# Patient Record
Sex: Female | Born: 1955 | Race: Black or African American | Hispanic: No | Marital: Married | Smoking: Former smoker
Health system: Southern US, Community
[De-identification: ages and names within clinical notes are randomized; demographics above are authoritative.]

## PROBLEM LIST (undated history)

## (undated) DIAGNOSIS — C801 Malignant (primary) neoplasm, unspecified: Secondary | ICD-10-CM

## (undated) DIAGNOSIS — I10 Essential (primary) hypertension: Secondary | ICD-10-CM

## (undated) DIAGNOSIS — J189 Pneumonia, unspecified organism: Secondary | ICD-10-CM

## (undated) DIAGNOSIS — M1990 Unspecified osteoarthritis, unspecified site: Secondary | ICD-10-CM

## (undated) DIAGNOSIS — N898 Other specified noninflammatory disorders of vagina: Secondary | ICD-10-CM

## (undated) DIAGNOSIS — Z923 Personal history of irradiation: Secondary | ICD-10-CM

## (undated) DIAGNOSIS — J45909 Unspecified asthma, uncomplicated: Secondary | ICD-10-CM

## (undated) DIAGNOSIS — B977 Papillomavirus as the cause of diseases classified elsewhere: Secondary | ICD-10-CM

## (undated) DIAGNOSIS — I209 Angina pectoris, unspecified: Secondary | ICD-10-CM

## (undated) DIAGNOSIS — E119 Type 2 diabetes mellitus without complications: Secondary | ICD-10-CM

## (undated) DIAGNOSIS — R519 Headache, unspecified: Secondary | ICD-10-CM

## (undated) DIAGNOSIS — K219 Gastro-esophageal reflux disease without esophagitis: Secondary | ICD-10-CM

## (undated) DIAGNOSIS — I2510 Atherosclerotic heart disease of native coronary artery without angina pectoris: Secondary | ICD-10-CM

## (undated) DIAGNOSIS — R51 Headache: Secondary | ICD-10-CM

## (undated) DIAGNOSIS — R0602 Shortness of breath: Secondary | ICD-10-CM

## (undated) DIAGNOSIS — N189 Chronic kidney disease, unspecified: Secondary | ICD-10-CM

## (undated) DIAGNOSIS — Z8719 Personal history of other diseases of the digestive system: Secondary | ICD-10-CM

## (undated) DIAGNOSIS — R011 Cardiac murmur, unspecified: Secondary | ICD-10-CM

## (undated) HISTORY — PX: NECK SURGERY: SHX720

## (undated) HISTORY — PX: COLONOSCOPY: SHX174

## (undated) HISTORY — DX: Headache, unspecified: R51.9

## (undated) HISTORY — DX: Other specified noninflammatory disorders of vagina: N89.8

## (undated) HISTORY — PX: EYE SURGERY: SHX253

## (undated) HISTORY — PX: WISDOM TOOTH EXTRACTION: SHX21

## (undated) HISTORY — DX: Papillomavirus as the cause of diseases classified elsewhere: B97.7

## (undated) HISTORY — DX: Unspecified asthma, uncomplicated: J45.909

## (undated) HISTORY — PX: ABDOMINAL HYSTERECTOMY: SHX81

## (undated) HISTORY — DX: Headache: R51

---
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
## 1999-02-05 ENCOUNTER — Encounter: Admission: RE | Admit: 1999-02-05 | Discharge: 1999-02-05 | Payer: Self-pay | Admitting: Nephrology

## 1999-02-05 ENCOUNTER — Encounter: Payer: Self-pay | Admitting: Nephrology

## 1999-04-09 ENCOUNTER — Other Ambulatory Visit: Admission: RE | Admit: 1999-04-09 | Discharge: 1999-04-09 | Payer: Self-pay | Admitting: Nephrology

## 1999-08-31 ENCOUNTER — Other Ambulatory Visit: Admission: RE | Admit: 1999-08-31 | Discharge: 1999-08-31 | Payer: Self-pay | Admitting: Obstetrics and Gynecology

## 1999-09-02 ENCOUNTER — Other Ambulatory Visit: Admission: RE | Admit: 1999-09-02 | Discharge: 1999-09-02 | Payer: Self-pay | Admitting: Obstetrics and Gynecology

## 1999-11-17 ENCOUNTER — Other Ambulatory Visit: Admission: RE | Admit: 1999-11-17 | Discharge: 1999-11-17 | Payer: Self-pay | Admitting: Obstetrics and Gynecology

## 1999-12-11 ENCOUNTER — Encounter (INDEPENDENT_AMBULATORY_CARE_PROVIDER_SITE_OTHER): Payer: Self-pay | Admitting: Specialist

## 1999-12-11 ENCOUNTER — Other Ambulatory Visit: Admission: RE | Admit: 1999-12-11 | Discharge: 1999-12-11 | Payer: Self-pay | Admitting: Obstetrics and Gynecology

## 2000-04-12 ENCOUNTER — Other Ambulatory Visit: Admission: RE | Admit: 2000-04-12 | Discharge: 2000-04-12 | Payer: Self-pay | Admitting: Obstetrics and Gynecology

## 2000-07-05 ENCOUNTER — Other Ambulatory Visit: Admission: RE | Admit: 2000-07-05 | Discharge: 2000-07-05 | Payer: Self-pay | Admitting: Obstetrics and Gynecology

## 2001-04-21 ENCOUNTER — Encounter: Payer: Self-pay | Admitting: Nephrology

## 2001-04-21 ENCOUNTER — Encounter: Admission: RE | Admit: 2001-04-21 | Discharge: 2001-04-21 | Payer: Self-pay | Admitting: Nephrology

## 2001-08-13 ENCOUNTER — Emergency Department (HOSPITAL_COMMUNITY): Admission: EM | Admit: 2001-08-13 | Discharge: 2001-08-14 | Payer: Self-pay | Admitting: Emergency Medicine

## 2002-03-20 ENCOUNTER — Other Ambulatory Visit: Admission: RE | Admit: 2002-03-20 | Discharge: 2002-03-20 | Payer: Self-pay | Admitting: Obstetrics and Gynecology

## 2002-09-14 ENCOUNTER — Encounter: Payer: Self-pay | Admitting: Emergency Medicine

## 2002-09-14 ENCOUNTER — Emergency Department (HOSPITAL_COMMUNITY): Admission: EM | Admit: 2002-09-14 | Discharge: 2002-09-14 | Payer: Self-pay | Admitting: Emergency Medicine

## 2003-04-03 ENCOUNTER — Other Ambulatory Visit: Admission: RE | Admit: 2003-04-03 | Discharge: 2003-04-03 | Payer: Self-pay | Admitting: Obstetrics and Gynecology

## 2003-07-24 ENCOUNTER — Encounter (INDEPENDENT_AMBULATORY_CARE_PROVIDER_SITE_OTHER): Payer: Self-pay | Admitting: Specialist

## 2003-07-24 ENCOUNTER — Ambulatory Visit (HOSPITAL_COMMUNITY): Admission: RE | Admit: 2003-07-24 | Discharge: 2003-07-24 | Payer: Self-pay | Admitting: Gastroenterology

## 2004-04-01 ENCOUNTER — Other Ambulatory Visit: Admission: RE | Admit: 2004-04-01 | Discharge: 2004-04-01 | Payer: Self-pay | Admitting: Obstetrics and Gynecology

## 2004-05-29 ENCOUNTER — Ambulatory Visit (HOSPITAL_BASED_OUTPATIENT_CLINIC_OR_DEPARTMENT_OTHER): Admission: RE | Admit: 2004-05-29 | Discharge: 2004-05-29 | Payer: Self-pay | Admitting: Ophthalmology

## 2004-07-15 ENCOUNTER — Encounter: Admission: RE | Admit: 2004-07-15 | Discharge: 2004-10-13 | Payer: Self-pay | Admitting: Internal Medicine

## 2004-10-22 ENCOUNTER — Encounter: Admission: RE | Admit: 2004-10-22 | Discharge: 2005-01-20 | Payer: Self-pay | Admitting: Internal Medicine

## 2005-03-17 ENCOUNTER — Other Ambulatory Visit: Admission: RE | Admit: 2005-03-17 | Discharge: 2005-03-17 | Payer: Self-pay | Admitting: Obstetrics and Gynecology

## 2007-08-08 ENCOUNTER — Ambulatory Visit (HOSPITAL_COMMUNITY): Admission: RE | Admit: 2007-08-08 | Discharge: 2007-08-08 | Payer: Self-pay | Admitting: Obstetrics and Gynecology

## 2008-10-31 ENCOUNTER — Ambulatory Visit (HOSPITAL_COMMUNITY): Admission: RE | Admit: 2008-10-31 | Discharge: 2008-10-31 | Payer: Self-pay | Admitting: Internal Medicine

## 2009-11-03 ENCOUNTER — Ambulatory Visit (HOSPITAL_COMMUNITY): Admission: RE | Admit: 2009-11-03 | Discharge: 2009-11-03 | Payer: Self-pay | Admitting: Internal Medicine

## 2010-06-26 NOTE — Op Note (Signed)
NAMEJANEKA, LIBMAN                       ACCOUNT NO.:  [id number]   MEDICAL RECORD NO.:  [id number]                   PATIENT TYPE:  AMB   LOCATION:  ENDO                                 FACILITY:  [facility]   PHYSICIAN:  Anselmo Rod, M.D.               DATE OF BIRTH:  1956-02-05   DATE OF PROCEDURE:  07/24/2003  DATE OF DISCHARGE:                                 OPERATIVE REPORT   PROCEDURE PERFORMED:  Screening colonoscopy.   ENDOSCOPIST:  Charna Elizabeth, M.D.   INSTRUMENT USED:  Olympus video colonoscope.   INDICATIONS FOR PROCEDURE:  The patient is a 55 year old African-American  female with a history of rectal bleeding and change in bowel habits  undergoing screening colonoscopy.  Rule out colonic polyps, masses, etc.   PREPROCEDURE PREPARATION:  Informed consent was procured from the patient.  The patient was fasted for eight hours prior to the procedure and prepped  with a bottle of magnesium citrate and a gallon of GoLYTELY the night prior  to the procedure.   PREPROCEDURE PHYSICAL:  The patient had stable vital signs.  Neck supple.  Chest clear to auscultation.  S1 and S2 regular.  Abdomen soft with normal  bowel sounds.   DESCRIPTION OF PROCEDURE:  The patient was placed in left lateral decubitus  position and sedated with 75 mg of Demerol and 7.5 mg of Versed  intravenously.  Once the patient was adequately sedated and maintained on  low flow oxygen and continuous cardiac monitoring, the Olympus video  colonoscope was advanced from the rectum to the cecum.  Prominent internal  hemorrhoids were seen on retroflexion in the rectum.  Small sessile polyp  was biopsied from the proximal right colon.  The terminal ileum appeared  normal and without lesions.  No other masses or polyps were identified.  The  appendicular orifice and ileocecal valve were clearly visualized and  photographed.  The patient tolerated the procedure well without  complications.   IMPRESSION:  1. Small sessile polyp biopsied  from proximal right colon.  2. Prominent internal hemorrhoids.  3. Normal terminal ileum.   RECOMMENDATIONS:  1. Await pathology results.  2. Continue high fiber diet with liberal fluid intake.  3. Outpatient followup in the next two weeks for further recommendations.                                               Anselmo Rod, M.D.    JNM/MEDQ  D:  07/24/2003  T:  07/24/2003  Job:  [id number]   cc:   Jarome Matin, M.D.  [address] [place]  [place] [address]  Fax: [phone]

## 2010-06-26 NOTE — Op Note (Signed)
Carolyn [name], Carolyn [name]             ACCOUNT NO.:  [id number]   MEDICAL RECORD NO.:  [id number]          PATIENT TYPE:  AMB   LOCATION:  DSC                          FACILITY:  [facility]   PHYSICIAN:  Salley Scarlet., M.D.DATE OF BIRTH:  1955-05-16   DATE OF PROCEDURE:  05/29/2004  DATE OF DISCHARGE:  05/29/2004                                 OPERATIVE REPORT   PREOPERATIVE DIAGNOSIS:  Chalazion in upper lid right eye.   POSTOPERATIVE DIAGNOSIS:  Chalazion in upper lid right eye.   OPERATION:  Chalazion excision.   ANESTHESIA:  Local using Xylocaine 1%.   PROCEDURE:  The upper lid of the right eye was inspected and there was a  firm mass located along the central aspect of the upper lid of the right  eye.  The lid was then prepped and draped in the usual manner and then  subsequently infiltrated with several mL of lidocaine.  The chalazion clamp  was applied over the lesion and the lid was everted.  A cruciate incision  was made in the tarsus over the lesion and the lesion was curetted using the  chalazion curet.  The site was incised in toto using sharp and blunt  dissection.  Polysporin ophthalmic ointment and a pressure patch was  applied.  The patient tolerated the procedure well and was discharged to the  post anesthesia recovery room in satisfactory condition.  She was instructed  to take Vicodin every 4 hours as needed for pain today, to remove the patch  tomorrow and to resume her drops 4 times a day.  She is to see me in the  office in 1 week.   DISCHARGE DIAGNOSIS:  Chalazion upper lid right eye.      TB/MEDQ  D:  05/29/2004  T:  05/31/2004  Job:  [id number]

## 2010-07-08 ENCOUNTER — Other Ambulatory Visit: Payer: Self-pay | Admitting: Internal Medicine

## 2010-07-08 DIAGNOSIS — N644 Mastodynia: Secondary | ICD-10-CM

## 2010-07-13 ENCOUNTER — Ambulatory Visit
Admission: RE | Admit: 2010-07-13 | Discharge: 2010-07-13 | Disposition: A | Discharge status: Home / Self Care | Payer: Self-pay | Source: Ambulatory Visit | Attending: Internal Medicine | Admitting: Internal Medicine

## 2010-07-13 DIAGNOSIS — N644 Mastodynia: Secondary | ICD-10-CM

## 2010-09-23 ENCOUNTER — Other Ambulatory Visit (HOSPITAL_COMMUNITY): Payer: Self-pay | Admitting: Internal Medicine

## 2010-10-23 ENCOUNTER — Other Ambulatory Visit (HOSPITAL_COMMUNITY): Payer: Self-pay | Admitting: Internal Medicine

## 2010-10-23 DIAGNOSIS — Z1231 Encounter for screening mammogram for malignant neoplasm of breast: Secondary | ICD-10-CM

## 2010-11-02 ENCOUNTER — Ambulatory Visit (HOSPITAL_COMMUNITY)
Admission: RE | Admit: 2010-11-02 | Discharge: 2010-11-02 | Disposition: A | Discharge status: Home / Self Care | Payer: Self-pay | Source: Ambulatory Visit | Attending: Internal Medicine | Admitting: Internal Medicine

## 2010-11-02 DIAGNOSIS — Z1231 Encounter for screening mammogram for malignant neoplasm of breast: Secondary | ICD-10-CM

## 2012-03-23 ENCOUNTER — Ambulatory Visit: Payer: Self-pay | Admitting: Obstetrics and Gynecology

## 2012-03-27 ENCOUNTER — Encounter: Payer: Self-pay | Admitting: Obstetrics and Gynecology

## 2012-03-27 ENCOUNTER — Ambulatory Visit: Payer: BC Managed Care – PPO | Admitting: Obstetrics and Gynecology

## 2012-03-27 VITALS — BP 138/82 | HR 84 | Ht 61.5 in | Wt 202.0 lb

## 2012-03-27 DIAGNOSIS — Z124 Encounter for screening for malignant neoplasm of cervix: Secondary | ICD-10-CM

## 2012-03-27 DIAGNOSIS — Z01419 Encounter for gynecological examination (general) (routine) without abnormal findings: Secondary | ICD-10-CM

## 2012-03-27 NOTE — Progress Notes (Signed)
Regular Periods: no Mammogram: yes  Monthly Breast Ex.: yes Exercise: no  Tetanus < 10 years: no Seatbelts: yes  NI. Bladder Functn.: yes Abuse at home: no  Daily BM's: yes sometimes constipated Stressful Work: no  Healthy Diet: yes Sigmoid-Colonoscopy: 9 years ago  Calcium: no Medical problems this year: hip bothers patient sometimes   LAST PAP:2-3 years ago  Contraception: none pt has hyst  Mammogram:  2013  PCP: Dr. Ricki Miller  PMH: no change  FMH: no change  Last Bone Scan: never  Pt is married.

## 2012-03-27 NOTE — Progress Notes (Signed)
Subjective:    Carolyn [name] is a 57 y.o. female G3P0 who presents for annual exam. The patient complains of left hip pain that were initially relieved with muscle relaxaers, is worse with going up steps and is the side that she sleeps on.  Walks daily    Review of Systems Gastrointestinal:No change in bowel habits, no abdominal pain, no rectal bleeding Genitourinary:negative for abnormal vaginal bleeding,  dysuria, frequency, hematuria, nocturia and urinary incontinence    Objective:     BP 138/82  Pulse 84  Ht 5' 1.5" (1.562 m)  Wt 202 lb (91.627 kg)  BMI 37.55 kg/m2 Weight:  Wt Readings from Last 1 Encounters:  03/27/12 202 lb (91.627 kg)   Body mass index is 37.55 kg/(m^2). General Appearance:  Well nourished in no acute distress HEENT: Grossly normal Neck / Thyroid: Supple, no masses, nodes or enlargement Lungs: Clear to auscultation bilaterally Back: No CVA tenderness Breast Exam: No masses or nodes.No dimpling, nipple retraction or discharge. Cardiovascular: Regular rate and rhythm.  Gastrointestinal: Soft, non-tender, no masses or organomegaly Pelvic Exam: EGBUS-normal, vagina-pink mucosa without lesions, cervix-no tenderness or lesions, uterus-appears normal size, shape and consistency, adnexae-no masses or tenderness Rectovaginal: Normal sphincter tone and  no masses  Lymphatic Exam: Non-palpable nodes in neck, clavicular, axillary, or inguinal regions Skin: No rash or abnormalities Extremities: no clubbing cyanosis or edema Neurologic: Grossly normal Psychiatric: Alert and oriented x 3     Assessment:   Routine GYN Exam Left Hip Pain Left Shoulder Pain   Plan:  DEXA scan  PAP sent  F/U with PCP for management of and possible referral for left shoulder/hip pain  Change waking shoes and stretch after 1 minute of gentle walking before workout  Bowel hygiene, may use probiotics and stool softeners with bowel retraining  RTO 1 year or prn  Reviewed  revised guidelines for PAP smear maintenance schedule.  Skeeter Sheard,ELMIRAPA-C

## 2012-03-28 LAB — PAP IG W/ RFLX HPV ASCU

## 2012-03-30 ENCOUNTER — Other Ambulatory Visit: Payer: BC Managed Care – PPO

## 2012-04-04 ENCOUNTER — Other Ambulatory Visit: Payer: BC Managed Care – PPO

## 2012-06-13 ENCOUNTER — Other Ambulatory Visit: Payer: Self-pay

## 2012-06-13 ENCOUNTER — Ambulatory Visit (HOSPITAL_COMMUNITY)
Admission: RE | Admit: 2012-06-13 | Discharge: 2012-06-14 | Disposition: A | Discharge status: Home / Self Care | Payer: BC Managed Care – PPO | Source: Ambulatory Visit | Attending: Cardiology | Admitting: Cardiology

## 2012-06-13 ENCOUNTER — Encounter (HOSPITAL_COMMUNITY): Payer: Self-pay | Admitting: General Practice

## 2012-06-13 ENCOUNTER — Encounter (HOSPITAL_COMMUNITY)
Admission: RE | Disposition: A | Discharge status: Home / Self Care | Payer: Self-pay | Source: Ambulatory Visit | Attending: Cardiology

## 2012-06-13 DIAGNOSIS — E785 Hyperlipidemia, unspecified: Secondary | ICD-10-CM

## 2012-06-13 DIAGNOSIS — R0609 Other forms of dyspnea: Secondary | ICD-10-CM | POA: Insufficient documentation

## 2012-06-13 DIAGNOSIS — R079 Chest pain, unspecified: Secondary | ICD-10-CM | POA: Insufficient documentation

## 2012-06-13 DIAGNOSIS — R0989 Other specified symptoms and signs involving the circulatory and respiratory systems: Secondary | ICD-10-CM | POA: Insufficient documentation

## 2012-06-13 DIAGNOSIS — R7303 Prediabetes: Secondary | ICD-10-CM

## 2012-06-13 DIAGNOSIS — I2510 Atherosclerotic heart disease of native coronary artery without angina pectoris: Secondary | ICD-10-CM

## 2012-06-13 DIAGNOSIS — I10 Essential (primary) hypertension: Secondary | ICD-10-CM

## 2012-06-13 DIAGNOSIS — Z955 Presence of coronary angioplasty implant and graft: Secondary | ICD-10-CM

## 2012-06-13 DIAGNOSIS — Z79899 Other long term (current) drug therapy: Secondary | ICD-10-CM | POA: Insufficient documentation

## 2012-06-13 DIAGNOSIS — Z9861 Coronary angioplasty status: Secondary | ICD-10-CM

## 2012-06-13 DIAGNOSIS — R7309 Other abnormal glucose: Secondary | ICD-10-CM | POA: Insufficient documentation

## 2012-06-13 DIAGNOSIS — E669 Obesity, unspecified: Secondary | ICD-10-CM | POA: Insufficient documentation

## 2012-06-13 DIAGNOSIS — I209 Angina pectoris, unspecified: Secondary | ICD-10-CM

## 2012-06-13 HISTORY — DX: Unspecified osteoarthritis, unspecified site: M19.90

## 2012-06-13 HISTORY — DX: Atherosclerotic heart disease of native coronary artery without angina pectoris: I25.10

## 2012-06-13 HISTORY — PX: CORONARY ANGIOPLASTY: SHX604

## 2012-06-13 HISTORY — DX: Personal history of other diseases of the digestive system: Z87.19

## 2012-06-13 HISTORY — DX: Angina pectoris, unspecified: I20.9

## 2012-06-13 HISTORY — DX: Cardiac murmur, unspecified: R01.1

## 2012-06-13 HISTORY — DX: Pneumonia, unspecified organism: J18.9

## 2012-06-13 HISTORY — DX: Gastro-esophageal reflux disease without esophagitis: K21.9

## 2012-06-13 HISTORY — DX: Shortness of breath: R06.02

## 2012-06-13 HISTORY — PX: CARDIAC CATHETERIZATION: SHX172

## 2012-06-13 HISTORY — PX: LEFT HEART CATHETERIZATION WITH CORONARY ANGIOGRAM: SHX5451

## 2012-06-13 HISTORY — DX: Essential (primary) hypertension: I10

## 2012-06-13 LAB — POCT ACTIVATED CLOTTING TIME: Activated Clotting Time: 600 seconds

## 2012-06-13 SURGERY — LEFT HEART CATHETERIZATION WITH CORONARY ANGIOGRAM
Anesthesia: LOCAL

## 2012-06-13 MED ORDER — PRASUGREL HCL 10 MG PO TABS
ORAL_TABLET | ORAL | Status: AC
  Filled 2012-06-13: qty 6

## 2012-06-13 MED ORDER — ASPIRIN 81 MG PO CHEW
324.0000 mg | CHEWABLE_TABLET | ORAL | Status: AC
  Administered 2012-06-13: 324 mg via ORAL
  Filled 2012-06-13: qty 4

## 2012-06-13 MED ORDER — HEPARIN SODIUM (PORCINE) 1000 UNIT/ML IJ SOLN
INTRAMUSCULAR | Status: AC
  Filled 2012-06-13: qty 1

## 2012-06-13 MED ORDER — DIAZEPAM 5 MG PO TABS
5.0000 mg | ORAL_TABLET | ORAL | Status: AC
  Administered 2012-06-13: 5 mg via ORAL
  Filled 2012-06-13: qty 1

## 2012-06-13 MED ORDER — HYDROMORPHONE HCL PF 2 MG/ML IJ SOLN
INTRAMUSCULAR | Status: AC
  Filled 2012-06-13: qty 1

## 2012-06-13 MED ORDER — PSYLLIUM 95 % PO PACK
1.0000 | PACK | Freq: Every day | ORAL | Status: DC
  Administered 2012-06-13: 21:00:00 1 via ORAL
  Filled 2012-06-13 (×2): qty 1

## 2012-06-13 MED ORDER — SODIUM CHLORIDE 0.9 % IV SOLN: 250.0000 mL | INTRAVENOUS | Status: DC | PRN

## 2012-06-13 MED ORDER — ATORVASTATIN CALCIUM 80 MG PO TABS
80.0000 mg | ORAL_TABLET | Freq: Every day | ORAL | Status: DC
  Administered 2012-06-13: 80 mg via ORAL
  Filled 2012-06-13 (×2): qty 1

## 2012-06-13 MED ORDER — LISINOPRIL 10 MG PO TABS
10.0000 mg | ORAL_TABLET | Freq: Every evening | ORAL | Status: DC
  Administered 2012-06-13: 16:00:00 10 mg via ORAL
  Filled 2012-06-13 (×3): qty 1

## 2012-06-13 MED ORDER — METOPROLOL TARTRATE 25 MG PO TABS
25.0000 mg | ORAL_TABLET | Freq: Two times a day (BID) | ORAL | Status: DC
  Administered 2012-06-13 (×2): 25 mg via ORAL
  Filled 2012-06-13 (×4): qty 1

## 2012-06-13 MED ORDER — SODIUM CHLORIDE 0.9 % IV SOLN: INTRAVENOUS | Status: DC

## 2012-06-13 MED ORDER — VITAMIN D3 25 MCG (1000 UNIT) PO TABS
1000.0000 [IU] | ORAL_TABLET | Freq: Every day | ORAL | Status: DC
  Filled 2012-06-13: qty 1

## 2012-06-13 MED ORDER — ASPIRIN 81 MG PO CHEW
81.0000 mg | CHEWABLE_TABLET | Freq: Every day | ORAL | Status: DC
  Filled 2012-06-13: qty 1

## 2012-06-13 MED ORDER — SODIUM CHLORIDE 0.9 % IJ SOLN: 3.0000 mL | Freq: Two times a day (BID) | INTRAMUSCULAR | Status: DC

## 2012-06-13 MED ORDER — HEPARIN (PORCINE) IN NACL 2-0.9 UNIT/ML-% IJ SOLN
INTRAMUSCULAR | Status: AC
  Filled 2012-06-13: qty 1000

## 2012-06-13 MED ORDER — TRIAMTERENE-HCTZ 37.5-25 MG PO TABS
1.0000 | ORAL_TABLET | Freq: Every day | ORAL | Status: DC
  Filled 2012-06-13: qty 1

## 2012-06-13 MED ORDER — LORATADINE 10 MG PO TABS
10.0000 mg | ORAL_TABLET | Freq: Every day | ORAL | Status: DC
  Filled 2012-06-13: qty 1

## 2012-06-13 MED ORDER — SODIUM CHLORIDE 0.9 % IV SOLN
1.0000 mL/kg/h | INTRAVENOUS | Status: AC
  Administered 2012-06-13: 1 mL/kg/h via INTRAVENOUS

## 2012-06-13 MED ORDER — VERAPAMIL HCL 2.5 MG/ML IV SOLN
INTRAVENOUS | Status: AC
  Filled 2012-06-13: qty 2

## 2012-06-13 MED ORDER — LIDOCAINE HCL (PF) 1 % IJ SOLN
INTRAMUSCULAR | Status: AC
  Filled 2012-06-13: qty 30

## 2012-06-13 MED ORDER — ONDANSETRON HCL 4 MG/2ML IJ SOLN: 4.0000 mg | Freq: Four times a day (QID) | INTRAMUSCULAR | Status: DC | PRN

## 2012-06-13 MED ORDER — PANTOPRAZOLE SODIUM 20 MG PO TBEC
20.0000 mg | DELAYED_RELEASE_TABLET | Freq: Every day | ORAL | Status: DC
  Administered 2012-06-13: 16:00:00 20 mg via ORAL
  Filled 2012-06-13 (×2): qty 1

## 2012-06-13 MED ORDER — PNEUMOCOCCAL VAC POLYVALENT 25 MCG/0.5ML IJ INJ
0.5000 mL | INJECTION | Freq: Once | INTRAMUSCULAR | Status: DC
  Filled 2012-06-13: qty 0.5

## 2012-06-13 MED ORDER — SODIUM CHLORIDE 0.9 % IJ SOLN: 3.0000 mL | INTRAMUSCULAR | Status: DC | PRN

## 2012-06-13 MED ORDER — ACETAMINOPHEN 325 MG PO TABS: 650.0000 mg | ORAL_TABLET | ORAL | Status: DC | PRN

## 2012-06-13 MED ORDER — DIAZEPAM 2 MG PO TABS: 2.0000 mg | ORAL_TABLET | ORAL | Status: DC | PRN

## 2012-06-13 MED ORDER — CYCLOBENZAPRINE HCL 10 MG PO TABS: 10.0000 mg | ORAL_TABLET | Freq: Three times a day (TID) | ORAL | Status: DC | PRN

## 2012-06-13 MED ORDER — BIVALIRUDIN 250 MG IV SOLR
INTRAVENOUS | Status: AC
  Filled 2012-06-13: qty 250

## 2012-06-13 MED ORDER — NITROGLYCERIN 1 MG/10 ML FOR IR/CATH LAB
INTRA_ARTERIAL | Status: AC
  Filled 2012-06-13: qty 10

## 2012-06-13 MED ORDER — PRASUGREL HCL 10 MG PO TABS
10.0000 mg | ORAL_TABLET | Freq: Every day | ORAL | Status: DC
  Administered 2012-06-14: 10 mg via ORAL
  Filled 2012-06-13: qty 1

## 2012-06-13 MED ORDER — MIDAZOLAM HCL 2 MG/2ML IJ SOLN
INTRAMUSCULAR | Status: AC
  Filled 2012-06-13: qty 2

## 2012-06-13 NOTE — Interval H&P Note (Signed)
History and Physical Interval Note:  06/13/2012 11:31 AM  Carolyn [name]  has presented today for surgery, with the diagnosis of c/p  The various methods of treatment have been discussed with the patient and family. After consideration of risks, benefits and other options for treatment, the patient has consented to  Procedure(s): LEFT HEART CATHETERIZATION WITH CORONARY ANGIOGRAM (N/A) and angioplasty as a surgical intervention .  The patient's history has been reviewed, patient examined, no change in status, stable for surgery.  I have reviewed the patient's chart and labs.  Questions were answered to the patient's satisfaction.     Pamella Pert

## 2012-06-13 NOTE — Progress Notes (Signed)
Utilization Review Completed Maclaine Ahola J. Previn Jian, RN, BSN, NCM [phone]  

## 2012-06-13 NOTE — CV Procedure (Signed)
Procedure performed:  Left heart catheterization including hemodynamic monitoring of the left ventricle, LV gram. Selective right and left coronary arteriography. PTCA and stenting of the mid LAD with a 4.0 x 24 mm non-drug-eluting.  Indication: Patient is a 57 year-old female with history of hypertension,  hyperlipidemia, Pre- Diabetes Mellitus   who presents with chest pain and dyspnea. Patient has  had non invasive testing which was normal with EKG changes at 2 minutes into exercise stress.  Hence is brought to the cardiac catheterization lab to evaluate  coronary anatomy for definitive diagnosis of CAD.  Hemodynamic data: Left ventricular pressure was 116/6 with LVEDP of 11 mm mercury. Aortic pressure was 117/68 with a mean of 89 mm mercury. There was no pressure gradient across the aortic valve.   Left ventricle: Performed in the RAO projection revealed LVEF of 60%. There was No MR. No wall motion abnormality.  Right coronary artery: The vessel is large, mild luminal irregularities. C-Dominant with Circumflex coronary artery.  Left main coronary artery is large and normal.  Circumflex coronary artery: A large vessel giving origin to a large obtuse marginal 1.   LAD:  LAD gives origin to several small diagonals. The diagonals the is a small to moderate size vessel. The LAD after the origin of first septal perforator and between small to moderate size the 3 has a 99% stenosis. Mid to distal LAD has mild luminal irregularities.   Impression: High-grade stenosis of the mid LAD, 99% stenosis.  Interventional data: Successful PTCA and stenting of the 4.0 x 24 mm Veriflex stent to the mid LAD with reduction of stenosis from 99% to  0% with brisk TIMI-3 to TIMI-3 flow maintained in the procedure. Will need Dual antiplatelet therapy with Effient and ASA 81 mg for at least 3 months, probably 9-12 months. Then aspirin indefinitely. A total of 115 cc of contrast was utilized for diagnostic and  interventional procedure.  Technique of diagnostic cardiac catheterization:  Under sterile precautions using a 5 French right radial  arterial access, a 5 [place] Slim-sheath was introduced into the right radial artery. A 5 [place] Tig 4 catheter was advanced into the ascending aorta selective  right coronary artery and left coronary artery was cannulated and angiography was performed in multiple views. The catheter was pulled back Out of the body over exchange length J-wire.  Same Catheter was used to perform LV gram which was performed in RAO projection. Catheter exchanged out of the body over J-Wire. NO immediate complications noted.   Technique of intervention:  Using a 6 [place] Ikari Left 3.5 guide catheter theLM  coronary  was selected and cannulated. Using Angiomax for anticoagulation, I utilized a Runthrough guidewire and across the LAD coronary artery without any difficulty. I placed the tip of the wire into the distal  coronary artery. Angiography was performed.  Then I utilized a 3.0 x 20 mm splinter Legend balloon , I performed balloon angioplasty at 12 and 16 pressure x 2 for 15 and 45 seconds seconds each. I proceeded with implantation of a 4.0 x 24 mm Veriflex  Non  drug-eluting stent into the mid portion of the LAD coronary artery. The stent was deployed at 12 atmospheric pressure for 50  seconds. The stent was then post dilated with a 4.0 x 21 mm [place] sprinter balloon at 18 atmospheric pressure x2 for 35 seconds each throughout the stented segment. Post-balloon angioplasty results were excellent with 0% residual stenoses and TIMI-3 flow was maintained. There was no  evidence of edge dissection. The guidewire was withdrawn out of the body and the guide catheter was engaged and pulled out of the body over the J-wire the was no immediate complication.  Hemostasis achieved with TR band. Patient tolerated the procedure well.   Disposition: Patient will be discharged in the morning unless  complications with out-patient follow up.

## 2012-06-13 NOTE — H&P (Signed)
  Please see office visit notes for complete details of HPI.  

## 2012-06-13 NOTE — Progress Notes (Signed)
TR BAND REMOVAL  LOCATION:    right radial  DEFLATED PER PROTOCOL:    yes  TIME BAND OFF / DRESSING APPLIED:    1600   SITE UPON ARRIVAL:    Level 0  SITE AFTER BAND REMOVAL:    Level 0  REVERSE ALLEN'S TEST:     positive  CIRCULATION SENSATION AND MOVEMENT:    Within Normal Limits   yes  COMMENTS:   Tolerated procedure well 

## 2012-06-14 DIAGNOSIS — I2510 Atherosclerotic heart disease of native coronary artery without angina pectoris: Secondary | ICD-10-CM

## 2012-06-14 DIAGNOSIS — I209 Angina pectoris, unspecified: Secondary | ICD-10-CM

## 2012-06-14 DIAGNOSIS — R7303 Prediabetes: Secondary | ICD-10-CM

## 2012-06-14 DIAGNOSIS — I10 Essential (primary) hypertension: Secondary | ICD-10-CM

## 2012-06-14 DIAGNOSIS — E785 Hyperlipidemia, unspecified: Secondary | ICD-10-CM

## 2012-06-14 DIAGNOSIS — Z9861 Coronary angioplasty status: Secondary | ICD-10-CM

## 2012-06-14 LAB — BASIC METABOLIC PANEL
BUN: 14 mg/dL (ref 6–23)
CO2: 28 mEq/L (ref 19–32)
Calcium: 8.8 mg/dL (ref 8.4–10.5)
Chloride: 101 mEq/L (ref 96–112)
Creatinine, Ser: 1.07 mg/dL (ref 0.50–1.10)
GFR calc Af Amer: 65 mL/min — ABNORMAL LOW (ref >90)
GFR calc non Af Amer: 56 mL/min — ABNORMAL LOW (ref >90)
Glucose, Bld: 122 mg/dL — ABNORMAL HIGH (ref 70–99)
Potassium: 3.7 mEq/L (ref 3.5–5.1)
Sodium: 137 mEq/L (ref 135–145)

## 2012-06-14 LAB — CBC
HCT: 35.7 % — ABNORMAL LOW (ref 36.0–46.0)
Hemoglobin: 11.9 g/dL — ABNORMAL LOW (ref 12.0–15.0)
MCH: 24 pg — ABNORMAL LOW (ref 26.0–34.0)
MCHC: 33.3 g/dL (ref 30.0–36.0)
MCV: 72 fL — ABNORMAL LOW (ref 78.0–100.0)
Platelets: 324 10*3/uL (ref 150–400)
RBC: 4.96 MIL/uL (ref 3.87–5.11)
RDW: 16.1 % — ABNORMAL HIGH (ref 11.5–15.5)
WBC: 9 10*3/uL (ref 4.0–10.5)

## 2012-06-14 MED ORDER — PRASUGREL HCL 10 MG PO TABS: 10.0000 mg | ORAL_TABLET | Freq: Every day | ORAL | Status: DC

## 2012-06-14 MED ORDER — NITROGLYCERIN 0.4 MG SL SUBL: 0.4000 mg | SUBLINGUAL_TABLET | SUBLINGUAL | Status: DC | PRN

## 2012-06-14 NOTE — Discharge Summary (Signed)
Physician Discharge Summary  Patient ID: Carolyn [name] MRN: [id number] DOB/AGE: 06/09/1955 57 y.o.  Admit date: 06/13/2012 Discharge date: 06/14/2012  Primary Discharge Diagnosis Angina pectoris Dyspnea. CAD S/P PTCA status  Secondary Discharge Diagnosis Hypertension Hyperlipidemia Prediabetes Obesity.  Hospital Course: Patient is a 57 year-old female with history of hypertension, hyperlipidemia, Pre- Diabetes Mellitus who presents with chest pain and dyspnea. Patient has had non invasive testing which was normal with EKG changes at 2 minutes into exercise stress. Admitted for elective coronary angiogram.   Left ventricle: Performed in the RAO projection revealed LVEF of 60%. There was No MR. No wall motion abnormality.    Right coronary artery: The vessel is large, mild luminal irregularities. C-Dominant with Circumflex coronary artery.    Left main coronary artery is large and normal.    Circumflex coronary artery: A large vessel giving origin to a large obtuse marginal 1.  LAD: LAD gives origin to several small diagonals. The diagonals the is a small to moderate size vessel. The LAD after the origin of first septal perforator and between small to moderate size the 3 has a 99% stenosis. Mid to distal LAD has mild luminal irregularities.    Interventional data: Successful PTCA and stenting of the 4.0 x 24 mm Veriflex stent to the mid LAD with reduction of stenosis from 99% to 0%           Recommendations on discharge: Discharged on dual antiplatelet therapy with ASA and Effient. NTG added. Already on good medical therapy, continue the same. Weight loss and exercise. Will encourage Cardiac rehab.  Discharge Exam: Blood pressure 115/58, pulse 58, temperature 98.8 F (37.1 C), temperature source Oral, resp. rate 18, height 5' (1.524 m), weight 88.5 kg (195 lb 1.7 oz), SpO2 96.00%.    General appearance: alert, cooperative, appears stated age, no distress and moderately  obese Resp: clear to auscultation bilaterally Cardio: regular rate and rhythm, S1, S2 normal, no murmur, click, rub or gallop GI: soft, non-tender; bowel sounds normal; no masses,  no organomegaly Extremities: extremities normal, atraumatic, no cyanosis or edema and Right radial artery access site good Labs:   Lab Results  Component Value Date   WBC 9.0 06/14/2012   HGB 11.9* 06/14/2012   HCT 35.7* 06/14/2012   MCV 72.0* 06/14/2012   PLT 324 06/14/2012   No results found for this basename: NA, K, CL, CO2, BUN, CREATININE, CALCIUM, LABALBU, PROT, BILITOT, ALKPHOS, ALT, AST, GLUCOSE,  in the last 168 hours No results found for this basename: CKTOTAL, CKMB, CKMBINDEX, TROPONINI    Lipid Panel  No results found for this basename: chol, trig, hdl, cholhdl, vldl, ldlcalc    EKG: normal EKG, normal sinus rhythm, unchanged from previous tracings.    Radiology: No results found.    FOLLOW UP PLANS AND APPOINTMENTS    Medication List    TAKE these medications       aspirin 81 MG tablet  Take 243 mg by mouth daily.     atorvastatin 80 MG tablet  Commonly known as:  LIPITOR  Take 80 mg by mouth at bedtime.     cetirizine 10 MG tablet  Commonly known as:  ZYRTEC  Take 10 mg by mouth daily.     cholecalciferol 1000 UNITS tablet  Commonly known as:  VITAMIN D  Take 1,000 Units by mouth daily.     cyclobenzaprine 10 MG tablet  Commonly known as:  FLEXERIL  Take 10 mg by mouth 3 (three) times daily  as needed for muscle spasms.     lansoprazole 15 MG capsule  Commonly known as:  PREVACID  Take 15 mg by mouth daily.     lisinopril 10 MG tablet  Commonly known as:  PRINIVIL,ZESTRIL  Take 10 mg by mouth every evening.     metoprolol tartrate 25 MG tablet  Commonly known as:  LOPRESSOR  Take 25 mg by mouth 2 (two) times daily.     nitroGLYCERIN 0.4 MG SL tablet  Commonly known as:  NITROSTAT  Place 1 tablet (0.4 mg total) under the tongue every 5 (five) minutes as needed for  chest pain.     OMEGA 3 PO  Take 1 tablet by mouth every evening. Omega Red     prasugrel 10 MG Tabs  Commonly known as:  EFFIENT  Take 1 tablet (10 mg total) by mouth daily.     psyllium 58.6 % packet  Commonly known as:  METAMUCIL  Take 1 packet by mouth at bedtime.     triamterene-hydrochlorothiazide 37.5-25 MG per tablet  Commonly known as:  MAXZIDE-25  Take 1 tablet by mouth daily.           Follow-up Information   Follow up with Pamella Pert, MD. (Keep previous appointment made)    Contact information:   [address] [place] [address] [phone]        Pamella Pert, MD 06/14/2012, 7:03 AM  Pager: [phone] Office: [phone] If no answer: [phone]

## 2012-06-14 NOTE — Progress Notes (Signed)
CARDIAC REHAB PHASE I   PRE:  Rate/Rhythm: 59SB  BP:  Supine:   Sitting: 141/66  Standing:    SaO2:   MODE:  Ambulation: 800 ft   POST:  Rate/Rhythm: 83SR  BP:  Supine:   Sitting: 126/65  Standing:    SaO2:  0730-0835 Pt walked 800 ft on RA with steady gait. Denied CP. Tolerated well. Education completed with pt and husband. Discussed CRP 2 and pt gave permission to refer to [place] Phase 2. Since pt is prediabetic, I gave her heart healthy and diabetic diet and discussed watching carbs.   Luetta Nutting, RN BSN  06/14/2012 8:31 AM

## 2012-07-20 ENCOUNTER — Encounter (HOSPITAL_COMMUNITY)
Admission: RE | Admit: 2012-07-20 | Discharge: 2012-07-20 | Disposition: A | Discharge status: Home / Self Care | Payer: BC Managed Care – PPO | Source: Ambulatory Visit | Attending: Cardiology | Admitting: Cardiology

## 2012-07-20 DIAGNOSIS — I10 Essential (primary) hypertension: Secondary | ICD-10-CM | POA: Insufficient documentation

## 2012-07-20 DIAGNOSIS — I2510 Atherosclerotic heart disease of native coronary artery without angina pectoris: Secondary | ICD-10-CM | POA: Insufficient documentation

## 2012-07-20 DIAGNOSIS — Z79899 Other long term (current) drug therapy: Secondary | ICD-10-CM | POA: Insufficient documentation

## 2012-07-20 DIAGNOSIS — E785 Hyperlipidemia, unspecified: Secondary | ICD-10-CM | POA: Insufficient documentation

## 2012-07-20 NOTE — Progress Notes (Signed)
Cardiac Rehab Medication Review by a Pharmacist  Does the patient  feel that his/her medications are working for him/her?  yes  Has the patient been experiencing any side effects to the medications prescribed?  no  Does the patient measure his/her own blood pressure or blood glucose at home?  yes   Does the patient have any problems obtaining medications due to transportation or finances?   no  Understanding of regimen: excellent Understanding of indications: excellent Potential of compliance: excellent    Pharmacist comments: none    Laurence Slate 07/20/2012 8:36 AM

## 2012-07-24 ENCOUNTER — Ambulatory Visit (HOSPITAL_COMMUNITY): Payer: BC Managed Care – PPO

## 2012-07-24 ENCOUNTER — Encounter (HOSPITAL_COMMUNITY): Payer: BC Managed Care – PPO

## 2012-07-26 ENCOUNTER — Ambulatory Visit (HOSPITAL_COMMUNITY): Payer: BC Managed Care – PPO

## 2012-07-26 ENCOUNTER — Encounter (HOSPITAL_COMMUNITY): Payer: BC Managed Care – PPO

## 2012-07-26 ENCOUNTER — Encounter (HOSPITAL_COMMUNITY)
Admission: RE | Admit: 2012-07-26 | Discharge: 2012-07-26 | Disposition: A | Discharge status: Home / Self Care | Payer: BC Managed Care – PPO | Source: Ambulatory Visit | Attending: Cardiology | Admitting: Cardiology

## 2012-07-26 NOTE — Progress Notes (Signed)
Pt in today for her first exercise session in Cardiac Rehab Phase II at 11:15.  Pt tolerated light exercise with no complaints noted.  Monitor showed Sr with inv T waves which is noted on her office EKG.  Continue to monitor.

## 2012-07-28 ENCOUNTER — Ambulatory Visit (HOSPITAL_COMMUNITY): Payer: BC Managed Care – PPO

## 2012-07-28 ENCOUNTER — Encounter (HOSPITAL_COMMUNITY)
Admission: RE | Admit: 2012-07-28 | Discharge: 2012-07-28 | Disposition: A | Discharge status: Home / Self Care | Payer: BC Managed Care – PPO | Source: Ambulatory Visit | Attending: Cardiology | Admitting: Cardiology

## 2012-07-31 ENCOUNTER — Ambulatory Visit (HOSPITAL_COMMUNITY): Payer: BC Managed Care – PPO

## 2012-07-31 ENCOUNTER — Encounter (HOSPITAL_COMMUNITY)
Admission: RE | Admit: 2012-07-31 | Discharge: 2012-07-31 | Disposition: A | Discharge status: Home / Self Care | Payer: BC Managed Care – PPO | Source: Ambulatory Visit | Attending: Cardiology | Admitting: Cardiology

## 2012-07-31 NOTE — Progress Notes (Signed)
Carolyn [name] is interested in vocational rehab.  Patient given vocational rehab questionnaire to complete and return to cardiac rehab

## 2012-08-02 ENCOUNTER — Ambulatory Visit (HOSPITAL_COMMUNITY): Payer: BC Managed Care – PPO

## 2012-08-02 ENCOUNTER — Encounter (HOSPITAL_COMMUNITY)
Admission: RE | Admit: 2012-08-02 | Discharge: 2012-08-02 | Disposition: A | Discharge status: Home / Self Care | Payer: BC Managed Care – PPO | Source: Ambulatory Visit | Attending: Cardiology | Admitting: Cardiology

## 2012-08-03 NOTE — Progress Notes (Signed)
Vocational rehab application forwarded to vocational rehab.

## 2012-08-04 ENCOUNTER — Encounter (HOSPITAL_COMMUNITY): Payer: BC Managed Care – PPO

## 2012-08-04 ENCOUNTER — Encounter (HOSPITAL_COMMUNITY)
Admission: RE | Admit: 2012-08-04 | Discharge: 2012-08-04 | Disposition: A | Discharge status: Home / Self Care | Payer: BC Managed Care – PPO | Source: Ambulatory Visit | Attending: Cardiology | Admitting: Cardiology

## 2012-08-04 ENCOUNTER — Ambulatory Visit (HOSPITAL_COMMUNITY): Payer: BC Managed Care – PPO

## 2012-08-07 ENCOUNTER — Telehealth (HOSPITAL_COMMUNITY): Payer: Self-pay | Admitting: Internal Medicine

## 2012-08-07 ENCOUNTER — Encounter (HOSPITAL_COMMUNITY)
Admission: RE | Admit: 2012-08-07 | Discharge: 2012-08-07 | Disposition: A | Discharge status: Home / Self Care | Payer: BC Managed Care – PPO | Source: Ambulatory Visit | Attending: Cardiology | Admitting: Cardiology

## 2012-08-07 ENCOUNTER — Ambulatory Visit (HOSPITAL_COMMUNITY): Payer: BC Managed Care – PPO

## 2012-08-07 NOTE — Progress Notes (Signed)
Reviewed home exercise with pt today.  Pt plans to walk and use stepper and bike at home for exercise.  Reviewed THR, pulse, RPE, sign and symptoms, NTG use, and when to call 911 or MD.  Pt voiced understanding. Fabio Pierce, MA, ACSM RCEP

## 2012-08-09 ENCOUNTER — Encounter (HOSPITAL_COMMUNITY)
Admission: RE | Admit: 2012-08-09 | Discharge: 2012-08-09 | Disposition: A | Discharge status: Home / Self Care | Payer: BC Managed Care – PPO | Source: Ambulatory Visit | Attending: Cardiology | Admitting: Cardiology

## 2012-08-09 ENCOUNTER — Ambulatory Visit (HOSPITAL_COMMUNITY): Payer: BC Managed Care – PPO

## 2012-08-09 DIAGNOSIS — E785 Hyperlipidemia, unspecified: Secondary | ICD-10-CM | POA: Insufficient documentation

## 2012-08-09 DIAGNOSIS — Z79899 Other long term (current) drug therapy: Secondary | ICD-10-CM | POA: Insufficient documentation

## 2012-08-09 DIAGNOSIS — I10 Essential (primary) hypertension: Secondary | ICD-10-CM | POA: Insufficient documentation

## 2012-08-09 DIAGNOSIS — I2510 Atherosclerotic heart disease of native coronary artery without angina pectoris: Secondary | ICD-10-CM | POA: Insufficient documentation

## 2012-08-14 ENCOUNTER — Encounter (HOSPITAL_COMMUNITY)
Admission: RE | Admit: 2012-08-14 | Discharge: 2012-08-14 | Disposition: A | Discharge status: Home / Self Care | Payer: BC Managed Care – PPO | Source: Ambulatory Visit | Attending: Cardiology | Admitting: Cardiology

## 2012-08-14 ENCOUNTER — Ambulatory Visit (HOSPITAL_COMMUNITY): Payer: BC Managed Care – PPO

## 2012-08-16 ENCOUNTER — Encounter (HOSPITAL_COMMUNITY)
Admission: RE | Admit: 2012-08-16 | Discharge: 2012-08-16 | Disposition: A | Discharge status: Home / Self Care | Payer: BC Managed Care – PPO | Source: Ambulatory Visit | Attending: Cardiology | Admitting: Cardiology

## 2012-08-16 ENCOUNTER — Ambulatory Visit (HOSPITAL_COMMUNITY): Payer: BC Managed Care – PPO

## 2012-08-18 ENCOUNTER — Encounter (HOSPITAL_COMMUNITY): Payer: BC Managed Care – PPO

## 2012-08-18 ENCOUNTER — Ambulatory Visit (HOSPITAL_COMMUNITY): Payer: BC Managed Care – PPO

## 2012-08-21 ENCOUNTER — Encounter (HOSPITAL_COMMUNITY)
Admission: RE | Admit: 2012-08-21 | Discharge: 2012-08-21 | Disposition: A | Discharge status: Home / Self Care | Payer: BC Managed Care – PPO | Source: Ambulatory Visit | Attending: Cardiology | Admitting: Cardiology

## 2012-08-21 ENCOUNTER — Ambulatory Visit (HOSPITAL_COMMUNITY): Payer: BC Managed Care – PPO

## 2012-08-21 NOTE — Progress Notes (Signed)
Carolyn [name] 57 y.o. female Nutrition Note Spoke with pt.  Nutrition Plan and Nutrition Survey goals reviewed with pt. Pt is following Step 2 of the Therapeutic Lifestyle Changes diet. Pt wants to lose wt. Pt has been trying to lose wt by walking for 45 minutes, 2 times daily (plus Cardiac Rehab on Mon, Wed, and Fri) and "watching my diet." Pt barriers to wt loss include "not eating frequently enough." Wt loss tips reviewed. Pt expressed understanding of the information reviewed. Pt aware of nutrition education classes offered and has attended 2 out of 4 nutrition classes. Pt attended the DM Blitz class "since DM runs in my family." Nutrition Diagnosis   Food-and nutrition-related knowledge deficit related to lack of exposure to information as related to diagnosis of: ? CVD    Obesity related to excessive energy intake as evidenced by a BMI of 35.1 Nutrition RX/ Estimated Daily Nutrition Needs for: wt loss  1400-1600 Kcal, 35-40 gm fat, 10-12 gm sat fat, 1.3-1.6 gm trans-fat, <1500 mg sodium  Nutrition Intervention   Pt's individual nutrition plan including cholesterol goals reviewed with pt.   Benefits of adopting Therapeutic Lifestyle Changes discussed when Medficts reviewed.   Pt to attend the Portion Distortion class   Pt to start a food journal to assist with weight loss   Pt to set an alarm to remind pt to eat every 3-5 hours   Pt to attend the  ? Nutrition I class                     ? Nutrition II class - met 08/01/12      ? Diabetes Blitz class - met 08/15/12   Continue client-centered nutrition education by RD, as part of interdisciplinary care. Goal(s)   Pt to identify food quantities necessary to achieve: ? wt loss to a goal wt of 168-186 lb (76.2-84.4 kg) at graduation from cardiac rehab.   Monitor and Evaluate progress toward nutrition goal with team. Nutrition Risk:  Low   Mickle Plumb, M.Ed, RD, LDN, CDE 08/21/2012 11:01 AM

## 2012-08-23 ENCOUNTER — Encounter (HOSPITAL_COMMUNITY)
Admission: RE | Admit: 2012-08-23 | Discharge: 2012-08-23 | Disposition: A | Discharge status: Home / Self Care | Payer: BC Managed Care – PPO | Source: Ambulatory Visit | Attending: Cardiology | Admitting: Cardiology

## 2012-08-23 ENCOUNTER — Ambulatory Visit (HOSPITAL_COMMUNITY): Payer: BC Managed Care – PPO

## 2012-08-25 ENCOUNTER — Encounter (HOSPITAL_COMMUNITY)
Admission: RE | Admit: 2012-08-25 | Discharge: 2012-08-25 | Disposition: A | Discharge status: Home / Self Care | Payer: BC Managed Care – PPO | Source: Ambulatory Visit | Attending: Cardiology | Admitting: Cardiology

## 2012-08-25 ENCOUNTER — Ambulatory Visit (HOSPITAL_COMMUNITY): Payer: BC Managed Care – PPO

## 2012-08-28 ENCOUNTER — Ambulatory Visit (HOSPITAL_COMMUNITY): Payer: BC Managed Care – PPO

## 2012-08-28 ENCOUNTER — Encounter (HOSPITAL_COMMUNITY)
Admission: RE | Admit: 2012-08-28 | Discharge: 2012-08-28 | Disposition: A | Discharge status: Home / Self Care | Payer: BC Managed Care – PPO | Source: Ambulatory Visit | Attending: Cardiology | Admitting: Cardiology

## 2012-08-29 NOTE — Progress Notes (Signed)
Carolyn [name] has had some intermittent exertional blood pressure elevations.  Carolyn [name] had bacon for breakfast yesterday and plans to watch her sodium intake. Will fax exercise flow sheets to Dr. Jacinto Halim office for review.

## 2012-08-30 ENCOUNTER — Encounter (HOSPITAL_COMMUNITY)
Admission: RE | Admit: 2012-08-30 | Discharge: 2012-08-30 | Disposition: A | Discharge status: Home / Self Care | Payer: BC Managed Care – PPO | Source: Ambulatory Visit | Attending: Cardiology | Admitting: Cardiology

## 2012-08-30 ENCOUNTER — Ambulatory Visit (HOSPITAL_COMMUNITY): Payer: BC Managed Care – PPO

## 2012-09-01 ENCOUNTER — Ambulatory Visit (HOSPITAL_COMMUNITY): Payer: BC Managed Care – PPO

## 2012-09-01 ENCOUNTER — Encounter (HOSPITAL_COMMUNITY)
Admission: RE | Admit: 2012-09-01 | Discharge: 2012-09-01 | Disposition: A | Discharge status: Home / Self Care | Payer: BC Managed Care – PPO | Source: Ambulatory Visit | Attending: Cardiology | Admitting: Cardiology

## 2012-09-04 ENCOUNTER — Encounter (HOSPITAL_COMMUNITY)
Admission: RE | Admit: 2012-09-04 | Discharge: 2012-09-04 | Disposition: A | Discharge status: Home / Self Care | Payer: BC Managed Care – PPO | Source: Ambulatory Visit | Attending: Cardiology | Admitting: Cardiology

## 2012-09-04 ENCOUNTER — Ambulatory Visit (HOSPITAL_COMMUNITY): Payer: BC Managed Care – PPO

## 2012-09-06 ENCOUNTER — Ambulatory Visit (HOSPITAL_COMMUNITY): Payer: BC Managed Care – PPO

## 2012-09-06 ENCOUNTER — Encounter (HOSPITAL_COMMUNITY)
Admission: RE | Admit: 2012-09-06 | Discharge: 2012-09-06 | Disposition: A | Discharge status: Home / Self Care | Payer: BC Managed Care – PPO | Source: Ambulatory Visit | Attending: Cardiology | Admitting: Cardiology

## 2012-09-06 LAB — GLUCOSE, CAPILLARY
Glucose-Capillary: 144 mg/dL — ABNORMAL HIGH (ref 70–99)
Glucose-Capillary: 117 mg/dL — ABNORMAL HIGH (ref 70–99)

## 2012-09-06 NOTE — Progress Notes (Signed)
Nutrition Note Spoke with pt. Pt started on 500 mg Metformin daily. Pt states her fasting CBG was 124 mg/dL at the MD's office yesterday. Pt has known she is pre-diabetic and has attended the DM Blitz class. Pt reports she was diagnosed with DM yesterday and that the MD started her on medication, in part, due to family history and recent stent. Pt states she is having difficulty coping with the diagnosis of DM "I didn't want to get out of bed this morning. I didn't even go on my morning walk that I always do." Pt feels she's "OK if I don't think about it." Pt encouraged by this writer to focus on the positive lifestyle behaviors that she has (e.g. 45 min twice daily walks and eating healthier). Pt expressed understanding. Pt MD faxed re: ? If pt is a newly diagnosed DM. Continue client-centered nutrition education by RD as part of interdisciplinary care.  Monitor and evaluate progress toward nutrition goal with team.  Mickle Plumb, M.Ed, RD, LDN, CDE 09/06/2012 10:44 AM

## 2012-09-08 ENCOUNTER — Encounter (HOSPITAL_COMMUNITY)
Admission: RE | Admit: 2012-09-08 | Discharge: 2012-09-08 | Disposition: A | Discharge status: Home / Self Care | Payer: BC Managed Care – PPO | Source: Ambulatory Visit | Attending: Cardiology | Admitting: Cardiology

## 2012-09-08 ENCOUNTER — Ambulatory Visit (HOSPITAL_COMMUNITY): Payer: BC Managed Care – PPO

## 2012-09-08 DIAGNOSIS — I10 Essential (primary) hypertension: Secondary | ICD-10-CM | POA: Insufficient documentation

## 2012-09-08 DIAGNOSIS — E785 Hyperlipidemia, unspecified: Secondary | ICD-10-CM | POA: Insufficient documentation

## 2012-09-08 DIAGNOSIS — I2510 Atherosclerotic heart disease of native coronary artery without angina pectoris: Secondary | ICD-10-CM | POA: Insufficient documentation

## 2012-09-08 DIAGNOSIS — Z79899 Other long term (current) drug therapy: Secondary | ICD-10-CM | POA: Insufficient documentation

## 2012-09-08 LAB — GLUCOSE, CAPILLARY
Glucose-Capillary: 109 mg/dL — ABNORMAL HIGH (ref 70–99)
Glucose-Capillary: 102 mg/dL — ABNORMAL HIGH (ref 70–99)

## 2012-09-08 NOTE — Progress Notes (Signed)
Nutrition Note Received return fax from Dr. Ricki Miller. Per MD, pt is a new DM. A1c was 6.6. Discussed Dr. Lynne Logan fax with pt. Pt reports tolerating Metformin over the past 3 days. Pt concerned re: relationship between Lipitor and DM. Pt's concerns addressed. Pt expressed understanding of the information reviewed. Continue client-centered nutrition education by RD as part of interdisciplinary care.  Monitor and evaluate progress toward nutrition goal with team.  Mickle Plumb, M.Ed, RD, LDN, CDE 09/08/2012 11:17 AM

## 2012-09-11 ENCOUNTER — Ambulatory Visit (HOSPITAL_COMMUNITY): Payer: BC Managed Care – PPO

## 2012-09-11 ENCOUNTER — Encounter (HOSPITAL_COMMUNITY)
Admission: RE | Admit: 2012-09-11 | Discharge: 2012-09-11 | Disposition: A | Discharge status: Home / Self Care | Payer: BC Managed Care – PPO | Source: Ambulatory Visit | Attending: Cardiology | Admitting: Cardiology

## 2012-09-11 LAB — GLUCOSE, CAPILLARY
Glucose-Capillary: 101 mg/dL — ABNORMAL HIGH (ref 70–99)
Glucose-Capillary: 112 mg/dL — ABNORMAL HIGH (ref 70–99)

## 2012-09-13 ENCOUNTER — Encounter (HOSPITAL_COMMUNITY)
Admission: RE | Admit: 2012-09-13 | Discharge: 2012-09-13 | Disposition: A | Discharge status: Home / Self Care | Payer: BC Managed Care – PPO | Source: Ambulatory Visit | Attending: Cardiology | Admitting: Cardiology

## 2012-09-13 ENCOUNTER — Ambulatory Visit (HOSPITAL_COMMUNITY): Payer: BC Managed Care – PPO

## 2012-09-13 LAB — GLUCOSE, CAPILLARY
Glucose-Capillary: 107 mg/dL — ABNORMAL HIGH (ref 70–99)
Glucose-Capillary: 102 mg/dL — ABNORMAL HIGH (ref 70–99)

## 2012-09-13 NOTE — Progress Notes (Signed)
Justice continues to have some intermittent exertional blood pressures at cardiac rehab. Resting blood pressure have been within normal limits Will fax exercise flow sheets to Dr. Verl Dicker office for review.

## 2012-09-15 ENCOUNTER — Ambulatory Visit (HOSPITAL_COMMUNITY): Payer: BC Managed Care – PPO

## 2012-09-15 ENCOUNTER — Encounter (HOSPITAL_COMMUNITY)
Admission: RE | Admit: 2012-09-15 | Discharge: 2012-09-15 | Disposition: A | Discharge status: Home / Self Care | Payer: BC Managed Care – PPO | Source: Ambulatory Visit | Attending: Cardiology | Admitting: Cardiology

## 2012-09-15 LAB — GLUCOSE, CAPILLARY
Glucose-Capillary: 136 mg/dL — ABNORMAL HIGH (ref 70–99)
Glucose-Capillary: 98 mg/dL (ref 70–99)

## 2012-09-18 ENCOUNTER — Encounter (HOSPITAL_COMMUNITY)
Admission: RE | Admit: 2012-09-18 | Discharge: 2012-09-18 | Disposition: A | Discharge status: Home / Self Care | Payer: BC Managed Care – PPO | Source: Ambulatory Visit | Attending: Cardiology | Admitting: Cardiology

## 2012-09-18 ENCOUNTER — Ambulatory Visit (HOSPITAL_COMMUNITY): Payer: BC Managed Care – PPO

## 2012-09-18 LAB — GLUCOSE, CAPILLARY
Glucose-Capillary: 98 mg/dL (ref 70–99)
Glucose-Capillary: 101 mg/dL — ABNORMAL HIGH (ref 70–99)

## 2012-09-20 ENCOUNTER — Encounter (HOSPITAL_COMMUNITY)
Admission: RE | Admit: 2012-09-20 | Discharge: 2012-09-20 | Disposition: A | Discharge status: Home / Self Care | Payer: BC Managed Care – PPO | Source: Ambulatory Visit | Attending: Cardiology | Admitting: Cardiology

## 2012-09-20 ENCOUNTER — Ambulatory Visit (HOSPITAL_COMMUNITY): Payer: BC Managed Care – PPO

## 2012-09-20 LAB — GLUCOSE, CAPILLARY
Glucose-Capillary: 117 mg/dL — ABNORMAL HIGH (ref 70–99)
Glucose-Capillary: 94 mg/dL (ref 70–99)

## 2012-09-22 ENCOUNTER — Encounter (HOSPITAL_COMMUNITY)
Admission: RE | Admit: 2012-09-22 | Discharge: 2012-09-22 | Disposition: A | Discharge status: Home / Self Care | Payer: BC Managed Care – PPO | Source: Ambulatory Visit | Attending: Cardiology | Admitting: Cardiology

## 2012-09-22 ENCOUNTER — Ambulatory Visit (HOSPITAL_COMMUNITY): Payer: BC Managed Care – PPO

## 2012-09-22 LAB — GLUCOSE, CAPILLARY
Glucose-Capillary: 123 mg/dL — ABNORMAL HIGH (ref 70–99)
Glucose-Capillary: 93 mg/dL (ref 70–99)

## 2012-09-25 ENCOUNTER — Encounter (HOSPITAL_COMMUNITY)
Admission: RE | Admit: 2012-09-25 | Discharge: 2012-09-25 | Disposition: A | Discharge status: Home / Self Care | Payer: BC Managed Care – PPO | Source: Ambulatory Visit | Attending: Cardiology | Admitting: Cardiology

## 2012-09-25 ENCOUNTER — Ambulatory Visit (HOSPITAL_COMMUNITY): Payer: BC Managed Care – PPO

## 2012-09-25 LAB — GLUCOSE, CAPILLARY
Glucose-Capillary: 121 mg/dL — ABNORMAL HIGH (ref 70–99)
Glucose-Capillary: 99 mg/dL (ref 70–99)

## 2012-09-27 ENCOUNTER — Ambulatory Visit (HOSPITAL_COMMUNITY): Payer: BC Managed Care – PPO

## 2012-09-27 ENCOUNTER — Encounter (HOSPITAL_COMMUNITY)
Admission: RE | Admit: 2012-09-27 | Discharge: 2012-09-27 | Disposition: A | Discharge status: Home / Self Care | Payer: BC Managed Care – PPO | Source: Ambulatory Visit | Attending: Cardiology | Admitting: Cardiology

## 2012-09-27 LAB — GLUCOSE, CAPILLARY
Glucose-Capillary: 129 mg/dL — ABNORMAL HIGH (ref 70–99)
Glucose-Capillary: 96 mg/dL (ref 70–99)

## 2012-09-29 ENCOUNTER — Encounter (HOSPITAL_COMMUNITY)
Admission: RE | Admit: 2012-09-29 | Discharge: 2012-09-29 | Disposition: A | Discharge status: Home / Self Care | Payer: BC Managed Care – PPO | Source: Ambulatory Visit | Attending: Cardiology | Admitting: Cardiology

## 2012-09-29 ENCOUNTER — Ambulatory Visit (HOSPITAL_COMMUNITY): Payer: BC Managed Care – PPO

## 2012-09-29 LAB — GLUCOSE, CAPILLARY
Glucose-Capillary: 116 mg/dL — ABNORMAL HIGH (ref 70–99)
Glucose-Capillary: 106 mg/dL — ABNORMAL HIGH (ref 70–99)

## 2012-10-02 ENCOUNTER — Encounter (HOSPITAL_COMMUNITY)
Admission: RE | Admit: 2012-10-02 | Discharge: 2012-10-02 | Disposition: A | Discharge status: Home / Self Care | Payer: BC Managed Care – PPO | Source: Ambulatory Visit | Attending: Cardiology | Admitting: Cardiology

## 2012-10-02 ENCOUNTER — Ambulatory Visit (HOSPITAL_COMMUNITY): Payer: BC Managed Care – PPO

## 2012-10-02 LAB — GLUCOSE, CAPILLARY
Glucose-Capillary: 138 mg/dL — ABNORMAL HIGH (ref 70–99)
Glucose-Capillary: 100 mg/dL — ABNORMAL HIGH (ref 70–99)

## 2012-10-04 ENCOUNTER — Ambulatory Visit (HOSPITAL_COMMUNITY): Payer: BC Managed Care – PPO

## 2012-10-04 ENCOUNTER — Encounter (HOSPITAL_COMMUNITY)
Admission: RE | Admit: 2012-10-04 | Discharge: 2012-10-04 | Disposition: A | Discharge status: Home / Self Care | Payer: BC Managed Care – PPO | Source: Ambulatory Visit | Attending: Cardiology | Admitting: Cardiology

## 2012-10-04 LAB — GLUCOSE, CAPILLARY
Glucose-Capillary: 115 mg/dL — ABNORMAL HIGH (ref 70–99)
Glucose-Capillary: 90 mg/dL (ref 70–99)

## 2012-10-04 NOTE — Progress Notes (Signed)
Pt seen in the office on yesterday by Dr. Alverda Skeans.  Metformin d'cd.  Dr. Alverda Skeans plans to reassess in two month.  Continue to monitor blood glucoses at rehab.

## 2012-10-06 ENCOUNTER — Encounter (HOSPITAL_COMMUNITY)
Admission: RE | Admit: 2012-10-06 | Discharge: 2012-10-06 | Disposition: A | Discharge status: Home / Self Care | Payer: BC Managed Care – PPO | Source: Ambulatory Visit | Attending: Cardiology | Admitting: Cardiology

## 2012-10-06 ENCOUNTER — Ambulatory Visit (HOSPITAL_COMMUNITY): Payer: BC Managed Care – PPO

## 2012-10-06 LAB — GLUCOSE, CAPILLARY: Glucose-Capillary: 130 mg/dL — ABNORMAL HIGH (ref 70–99)

## 2012-10-09 ENCOUNTER — Encounter (HOSPITAL_COMMUNITY): Payer: BC Managed Care – PPO

## 2012-10-09 ENCOUNTER — Ambulatory Visit (HOSPITAL_COMMUNITY): Payer: BC Managed Care – PPO

## 2012-10-09 DIAGNOSIS — E785 Hyperlipidemia, unspecified: Secondary | ICD-10-CM | POA: Insufficient documentation

## 2012-10-09 DIAGNOSIS — Z79899 Other long term (current) drug therapy: Secondary | ICD-10-CM | POA: Insufficient documentation

## 2012-10-09 DIAGNOSIS — I10 Essential (primary) hypertension: Secondary | ICD-10-CM | POA: Insufficient documentation

## 2012-10-09 DIAGNOSIS — I2510 Atherosclerotic heart disease of native coronary artery without angina pectoris: Secondary | ICD-10-CM | POA: Insufficient documentation

## 2012-10-11 ENCOUNTER — Ambulatory Visit (HOSPITAL_COMMUNITY): Payer: BC Managed Care – PPO

## 2012-10-11 ENCOUNTER — Encounter (HOSPITAL_COMMUNITY)
Admission: RE | Admit: 2012-10-11 | Discharge: 2012-10-11 | Disposition: A | Discharge status: Home / Self Care | Payer: BC Managed Care – PPO | Source: Ambulatory Visit | Attending: Cardiology | Admitting: Cardiology

## 2012-10-11 LAB — GLUCOSE, CAPILLARY
Glucose-Capillary: 88 mg/dL (ref 70–99)
Glucose-Capillary: 114 mg/dL — ABNORMAL HIGH (ref 70–99)

## 2012-10-13 ENCOUNTER — Encounter (HOSPITAL_COMMUNITY)
Admission: RE | Admit: 2012-10-13 | Discharge: 2012-10-13 | Disposition: A | Discharge status: Home / Self Care | Payer: BC Managed Care – PPO | Source: Ambulatory Visit | Attending: Cardiology | Admitting: Cardiology

## 2012-10-13 ENCOUNTER — Ambulatory Visit (HOSPITAL_COMMUNITY): Payer: BC Managed Care – PPO

## 2012-10-13 LAB — GLUCOSE, CAPILLARY
Glucose-Capillary: 133 mg/dL — ABNORMAL HIGH (ref 70–99)
Glucose-Capillary: 101 mg/dL — ABNORMAL HIGH (ref 70–99)

## 2012-10-13 NOTE — Progress Notes (Signed)
Carolyn [name] said she had a Tetanus shot in her left deltoid when she had her physical with Dr Ricki Miller.  Carolyn [name] says her left upper shoulder has been sore ever since she received the shot.  Carolyn [name] says she is concerned that the soreness may be heart related.  Carolyn [name] says she had a similar feeling of soreness in her left shoulder before her stent placement.  Today's ECG tracing and cumulative exercise flow sheet faxed over to Dr Verl Dicker office for review.  I spoke with Denny Peon Dr Verl Dicker nurse. Dr Jacinto Halim reviewed the data and does not think Carolyn [name]'s soreness in her left shoulder is heart related. Patient notified and reassured. Will continue to monitor the patient throughout  the program.

## 2012-10-16 ENCOUNTER — Ambulatory Visit (HOSPITAL_COMMUNITY): Payer: BC Managed Care – PPO

## 2012-10-16 ENCOUNTER — Encounter (HOSPITAL_COMMUNITY)
Admission: RE | Admit: 2012-10-16 | Discharge: 2012-10-16 | Disposition: A | Discharge status: Home / Self Care | Payer: BC Managed Care – PPO | Source: Ambulatory Visit | Attending: Cardiology | Admitting: Cardiology

## 2012-10-16 LAB — GLUCOSE, CAPILLARY: Glucose-Capillary: 88 mg/dL (ref 70–99)

## 2012-10-18 ENCOUNTER — Ambulatory Visit (HOSPITAL_COMMUNITY): Payer: BC Managed Care – PPO

## 2012-10-18 ENCOUNTER — Encounter (HOSPITAL_COMMUNITY): Payer: BC Managed Care – PPO

## 2012-10-20 ENCOUNTER — Encounter (HOSPITAL_COMMUNITY)
Admission: RE | Admit: 2012-10-20 | Discharge: 2012-10-20 | Disposition: A | Discharge status: Home / Self Care | Payer: BC Managed Care – PPO | Source: Ambulatory Visit | Attending: Cardiology | Admitting: Cardiology

## 2012-10-20 ENCOUNTER — Ambulatory Visit (HOSPITAL_COMMUNITY): Payer: BC Managed Care – PPO

## 2012-10-20 LAB — GLUCOSE, CAPILLARY
Glucose-Capillary: 97 mg/dL (ref 70–99)
Glucose-Capillary: 115 mg/dL — ABNORMAL HIGH (ref 70–99)

## 2012-10-23 ENCOUNTER — Ambulatory Visit (HOSPITAL_COMMUNITY): Payer: BC Managed Care – PPO

## 2012-10-23 ENCOUNTER — Encounter (HOSPITAL_COMMUNITY)
Admission: RE | Admit: 2012-10-23 | Discharge: 2012-10-23 | Disposition: A | Discharge status: Home / Self Care | Payer: BC Managed Care – PPO | Source: Ambulatory Visit | Attending: Cardiology | Admitting: Cardiology

## 2012-10-23 LAB — GLUCOSE, CAPILLARY: Glucose-Capillary: 93 mg/dL (ref 70–99)

## 2012-10-25 ENCOUNTER — Encounter (HOSPITAL_COMMUNITY)
Admission: RE | Admit: 2012-10-25 | Discharge: 2012-10-25 | Disposition: A | Discharge status: Home / Self Care | Payer: BC Managed Care – PPO | Source: Ambulatory Visit | Attending: Cardiology | Admitting: Cardiology

## 2012-10-25 ENCOUNTER — Ambulatory Visit (HOSPITAL_COMMUNITY): Payer: BC Managed Care – PPO

## 2012-10-25 LAB — GLUCOSE, CAPILLARY: Glucose-Capillary: 99 mg/dL (ref 70–99)

## 2012-10-26 NOTE — Progress Notes (Signed)
Korrina graduated yesterday and plans to continue exercise on her own by walking.  Ajooni also reported having some cold symptoms, sinus pressure. I called Dr Verl Dicker office on Beulah behalf. Dr Verl Dicker office was to call [facility] whether she can use coricidin HBP. Vital signs were stable yesterday with exercise at cardiac rehab.

## 2012-10-27 ENCOUNTER — Encounter (HOSPITAL_COMMUNITY): Payer: BC Managed Care – PPO

## 2012-10-27 ENCOUNTER — Ambulatory Visit (HOSPITAL_COMMUNITY): Payer: BC Managed Care – PPO

## 2012-10-30 ENCOUNTER — Encounter (HOSPITAL_COMMUNITY): Payer: BC Managed Care – PPO

## 2013-02-01 ENCOUNTER — Encounter (HOSPITAL_COMMUNITY): Payer: Self-pay | Admitting: Emergency Medicine

## 2013-02-01 ENCOUNTER — Emergency Department (HOSPITAL_COMMUNITY)
Admission: EM | Admit: 2013-02-01 | Discharge: 2013-02-01 | Disposition: A | Discharge status: Home / Self Care | Payer: BC Managed Care – PPO | Attending: Emergency Medicine | Admitting: Emergency Medicine

## 2013-02-01 ENCOUNTER — Emergency Department (HOSPITAL_COMMUNITY): Payer: BC Managed Care – PPO

## 2013-02-01 DIAGNOSIS — I2510 Atherosclerotic heart disease of native coronary artery without angina pectoris: Secondary | ICD-10-CM | POA: Insufficient documentation

## 2013-02-01 DIAGNOSIS — Z8701 Personal history of pneumonia (recurrent): Secondary | ICD-10-CM | POA: Insufficient documentation

## 2013-02-01 DIAGNOSIS — K219 Gastro-esophageal reflux disease without esophagitis: Secondary | ICD-10-CM | POA: Insufficient documentation

## 2013-02-01 DIAGNOSIS — J45909 Unspecified asthma, uncomplicated: Secondary | ICD-10-CM

## 2013-02-01 DIAGNOSIS — Z8742 Personal history of other diseases of the female genital tract: Secondary | ICD-10-CM | POA: Insufficient documentation

## 2013-02-01 DIAGNOSIS — I209 Angina pectoris, unspecified: Secondary | ICD-10-CM | POA: Insufficient documentation

## 2013-02-01 DIAGNOSIS — Z8619 Personal history of other infectious and parasitic diseases: Secondary | ICD-10-CM | POA: Insufficient documentation

## 2013-02-01 DIAGNOSIS — M129 Arthropathy, unspecified: Secondary | ICD-10-CM | POA: Insufficient documentation

## 2013-02-01 DIAGNOSIS — J111 Influenza due to unidentified influenza virus with other respiratory manifestations: Secondary | ICD-10-CM | POA: Insufficient documentation

## 2013-02-01 DIAGNOSIS — Z79899 Other long term (current) drug therapy: Secondary | ICD-10-CM | POA: Insufficient documentation

## 2013-02-01 DIAGNOSIS — R011 Cardiac murmur, unspecified: Secondary | ICD-10-CM | POA: Insufficient documentation

## 2013-02-01 DIAGNOSIS — J45901 Unspecified asthma with (acute) exacerbation: Secondary | ICD-10-CM | POA: Insufficient documentation

## 2013-02-01 DIAGNOSIS — I10 Essential (primary) hypertension: Secondary | ICD-10-CM | POA: Insufficient documentation

## 2013-02-01 DIAGNOSIS — Z87891 Personal history of nicotine dependence: Secondary | ICD-10-CM | POA: Insufficient documentation

## 2013-02-01 DIAGNOSIS — R52 Pain, unspecified: Secondary | ICD-10-CM | POA: Insufficient documentation

## 2013-02-01 DIAGNOSIS — Z7982 Long term (current) use of aspirin: Secondary | ICD-10-CM | POA: Insufficient documentation

## 2013-02-01 MED ORDER — ALBUTEROL SULFATE (5 MG/ML) 0.5% IN NEBU
2.5000 mg | INHALATION_SOLUTION | Freq: Once | RESPIRATORY_TRACT | Status: AC
  Administered 2013-02-01: 2.5 mg via RESPIRATORY_TRACT
  Filled 2013-02-01: qty 0.5

## 2013-02-01 MED ORDER — ALBUTEROL SULFATE HFA 108 (90 BASE) MCG/ACT IN AERS: 1.0000 | INHALATION_SPRAY | RESPIRATORY_TRACT | Status: DC | PRN

## 2013-02-01 NOTE — ED Notes (Signed)
Pt c/o congestion with cough and URI sx x 2 days; pt sts some SOB when coughing; pt sts body aches but unsure of fever

## 2013-02-01 NOTE — ED Notes (Signed)
Pt states SOB, productive cough with yellow sputum, and body aches. Ongoing x2 days. Also states possible fever at home. Respirations unlabored. Speaking complete sentences. Pt is alert and oriented x4. Skin warm and dry. Denies pain at the time.

## 2013-02-01 NOTE — ED Notes (Signed)
Pt transported to x-ray. States she feels much better after nebulizer treatment.

## 2013-02-01 NOTE — ED Provider Notes (Signed)
Medical screening examination/treatment/procedure(s) were performed by non-physician practitioner and as supervising physician I was immediately available for consultation/collaboration.  EKG Interpretation   None        Derwood Kaplan, MD 02/01/13 1720

## 2013-02-01 NOTE — ED Provider Notes (Signed)
CSN: [id number]     Arrival date & time 02/01/13  1129 History   First MD Initiated Contact with Patient 02/01/13 1135     Chief Complaint  Patient presents with  . Cough  . Generalized Body Aches   (Consider location/radiation/quality/duration/timing/severity/associated sxs/prior Treatment) HPI This is a 57 year old female with a past medical history of hypertension, hyperlipidemia, stent placement in May of 2014 for coronary artery disease.  She presents today to the emergency department with complaint of cough, congestion, body aches, chills.  Patient states that symptoms have been going on for the past few days.  She has had fits of uncontrolled coughing with associated shortness of breath.  She denies any chest pain.  Patient states he has been unable to sleep due to her symptoms.  She denies any chest pain.  She has a productive cough of clear phlegm.  Patient has been using CoRCIDIN as well as generic prescribed cough syrup containing phenylephrine, promethazine and codeine.  Patient states that she has a distant history of asthma.  She does have a history of pneumonia.  She denies any swelling in her ankles.  She did have a flu shot this year.  The patient states she was babysitting a small child last weekend he had a high fever and bleed she got viral infection from the child.  Past Medical History  Diagnosis Date  . Vaginal dryness     h/o  . 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 Asthma     h/o  . Persistent headaches     h/o  . Coronary artery disease   . Anginal pain     " with exertion "  . Hypertension   . Shortness of breath   . Pneumonia     hx of PNA  . Heart murmur   . GERD (gastroesophageal reflux disease)   . H/O hiatal hernia   . Arthritis     " 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 "   Past Surgical History  Procedure Laterality Date  . Abdominal hysterectomy    . Carpel tunnel surgery    . Colonoscopy      9 years ago  . Cardiac catheterization  06/13/2012  . Bone spur    . Coronary  angioplasty  06/13/2012   Family History  Problem Relation Age of Onset  . Breast cancer Other   . Diabetes Mother   . Glaucoma Mother   . Hypertension Mother   . Hypertension Father   . Asthma Father   . Diabetes Sister   . Diabetes Brother    History  Substance Use Topics  . Smoking status: Former Smoker    Quit date: 02/09/1999  . Smokeless tobacco: Never Used  . Alcohol Use: No   OB History   Grav Para Term Preterm Abortions TAB SAB Ect Mult Living   3         3     Review of Systems Ten systems reviewed and are negative for acute change, except as noted in the HPI.   Allergies  Review of patient's allergies indicates no known allergies.  Home Medications   Current Outpatient Rx  Name  Route  Sig  Dispense  Refill  . aspirin 81 MG tablet   Oral   Take 243 mg by mouth daily.          Marland Kitchen atorvastatin (LIPITOR) 80 MG tablet   Oral   Take 40 mg by mouth at bedtime.          Marland Kitchen  cetirizine (ZYRTEC) 10 MG tablet   Oral   Take 10 mg by mouth daily.         . cholecalciferol (VITAMIN D) 1000 UNITS tablet   Oral   Take 1,000 Units by mouth daily.         . cyclobenzaprine (FLEXERIL) 10 MG tablet   Oral   Take 10 mg by mouth 3 (three) times daily as needed for muscle spasms.         . lansoprazole (PREVACID) 15 MG capsule   Oral   Take 15 mg by mouth daily.         Marland Kitchen lisinopril (PRINIVIL,ZESTRIL) 10 MG tablet   Oral   Take 10 mg by mouth every evening.          . metoprolol tartrate (LOPRESSOR) 25 MG tablet   Oral   Take 25 mg by mouth 2 (two) times daily.         . nitroGLYCERIN (NITROSTAT) 0.4 MG SL tablet   Sublingual   Place 1 tablet (0.4 mg total) under the tongue every 5 (five) minutes as needed for chest pain.   30 tablet   12   . Omega-3 Fatty Acids (OMEGA 3 PO)   Oral   Take 1 tablet by mouth every evening. Omega Red         . prasugrel (EFFIENT) 10 MG TABS   Oral   Take 1 tablet (10 mg total) by mouth daily.   30  tablet   0   . psyllium (METAMUCIL) 58.6 % packet   Oral   Take 1 packet by mouth at bedtime.         . triamterene-hydrochlorothiazide (MAXZIDE-25) 37.5-25 MG per tablet   Oral   Take 1 tablet by mouth daily.          BP 130/66  Pulse 90  Temp(Src) 99 F (37.2 C) (Oral)  Resp 18  Ht 5' (1.524 m)  Wt 187 lb (84.823 kg)  BMI 36.52 kg/m2  SpO2 100% Physical Exam Physical Exam  Appears moderately ill but not toxic; temperature as noted in vitals. Ears normal. Eyes:glassy appearance, no discharge  Heart: RRR, NO M/G/R Throat and pharynx normal.   Neck supple. No adenopathyhy in the neck. No JVD. No peripheral edema. Sinuses non tender.  Chest with Tight, bronchitic cough. Abdomen is soft and nontender    ED Course  Procedures (including critical care time) Labs Review Labs Reviewed - No data to display Imaging Review No results found.  EKG Interpretation   None       MDM   1. Influenza-like illness   2. RAD (reactive airway disease)    BP 130/66  Pulse 90  Temp(Src) 99 F (37.2 C) (Oral)  Resp 18  Ht 5' (1.524 m)  Wt 187 lb (84.823 kg)  BMI 36.52 kg/m2  SpO2 100%  Patient feeling better after albuterol treatment.  Patient with symptoms consistent with influenza.  Vitals are stable, low-grade fever.  No signs of dehydration, tolerating PO's.  Lungs are clear. Due to patient's presentation and physical exam a chest x-ray was not ordered bc likely diagnosis of flu.  Discussed the cost versus benefit of Tamiflu treatment with the patient.  The patient understands that symptoms are greater than the recommended 24-48 hour window of treatment.    Patient will be discharged with albuterol inhaler, instructions to orally hydrate, rest, and use over-the-counter medications such as anti-inflammatories ibuprofen and Aleve for muscle aches and Tylenol  for fever.  Patient will also be given a cough suppressant.    Arthor Captain, PA-C 02/01/13 1332

## 2013-02-08 HISTORY — PX: OTHER SURGICAL HISTORY: SHX169

## 2013-02-20 ENCOUNTER — Telehealth (HOSPITAL_COMMUNITY): Payer: Self-pay | Admitting: Dietician

## 2013-02-21 NOTE — Telephone Encounter (Signed)
Received voicemail from pt left at 1033. Called back at 1050. Registered for 02/27/13 DM class at 1000.

## 2013-02-21 NOTE — Telephone Encounter (Signed)
Called pt on 02/20/13 at 1023. Left message on voicemail.

## 2013-02-21 NOTE — Telephone Encounter (Signed)
Received call from Carolyn [name] in the rehab department on 02/19/13 at 2004. Pt would like to attend DM class.

## 2013-02-27 ENCOUNTER — Encounter (HOSPITAL_COMMUNITY): Payer: Self-pay | Admitting: Dietician

## 2013-02-27 NOTE — Progress Notes (Signed)
[facility] Diabetes Class Completion  Date:February 27, 2013  Time: 1000  Pt attended [facility]'s Diabetes [facility] Education Class on February 27, 2013.   Patient was educated on the following topics:   -Survival skills (signs and symptoms of hyperglycemia and hypoglycemia, treatment for hypoglycemia, ideal levels for fasting and postprandial blood sugars, goal Hgb A1c level, foot care basics)  -Recommendations for physical activity   -Carbohydrate metabolism in relation to diabetes   -Meal planning (sources of carbohydrate, carbohydrate counting, meal planning strategies, food label reading, and portion control).  Handouts provided:  -"Diabetes and You: Taking Charge of Your Health"  -"Carbohydrate Counting and Meal Planning"  -"Your Guide to Better Office Visits"   Sharlynn Seckinger A. Jimmye Norman, RD, LDN

## 2013-05-05 ENCOUNTER — Emergency Department (HOSPITAL_COMMUNITY)
Admission: EM | Admit: 2013-05-05 | Discharge: 2013-05-05 | Disposition: A | Discharge status: Home / Self Care | Payer: BC Managed Care – PPO | Attending: Emergency Medicine | Admitting: Emergency Medicine

## 2013-05-05 ENCOUNTER — Encounter (HOSPITAL_COMMUNITY): Payer: Self-pay | Admitting: Emergency Medicine

## 2013-05-05 DIAGNOSIS — I2510 Atherosclerotic heart disease of native coronary artery without angina pectoris: Secondary | ICD-10-CM | POA: Insufficient documentation

## 2013-05-05 DIAGNOSIS — Z87442 Personal history of urinary calculi: Secondary | ICD-10-CM | POA: Insufficient documentation

## 2013-05-05 DIAGNOSIS — M129 Arthropathy, unspecified: Secondary | ICD-10-CM | POA: Insufficient documentation

## 2013-05-05 DIAGNOSIS — T783XXA Angioneurotic edema, initial encounter: Secondary | ICD-10-CM | POA: Insufficient documentation

## 2013-05-05 DIAGNOSIS — T465X5A Adverse effect of other antihypertensive drugs, initial encounter: Secondary | ICD-10-CM | POA: Insufficient documentation

## 2013-05-05 DIAGNOSIS — Z79899 Other long term (current) drug therapy: Secondary | ICD-10-CM | POA: Insufficient documentation

## 2013-05-05 DIAGNOSIS — Z87891 Personal history of nicotine dependence: Secondary | ICD-10-CM | POA: Insufficient documentation

## 2013-05-05 DIAGNOSIS — J45909 Unspecified asthma, uncomplicated: Secondary | ICD-10-CM | POA: Insufficient documentation

## 2013-05-05 DIAGNOSIS — I10 Essential (primary) hypertension: Secondary | ICD-10-CM | POA: Insufficient documentation

## 2013-05-05 DIAGNOSIS — K219 Gastro-esophageal reflux disease without esophagitis: Secondary | ICD-10-CM | POA: Insufficient documentation

## 2013-05-05 DIAGNOSIS — Z7982 Long term (current) use of aspirin: Secondary | ICD-10-CM | POA: Insufficient documentation

## 2013-05-05 DIAGNOSIS — Z8701 Personal history of pneumonia (recurrent): Secondary | ICD-10-CM | POA: Insufficient documentation

## 2013-05-05 DIAGNOSIS — R011 Cardiac murmur, unspecified: Secondary | ICD-10-CM | POA: Insufficient documentation

## 2013-05-05 DIAGNOSIS — Z9861 Coronary angioplasty status: Secondary | ICD-10-CM | POA: Insufficient documentation

## 2013-05-05 MED ORDER — DIPHENHYDRAMINE HCL 25 MG PO CAPS
25.0000 mg | ORAL_CAPSULE | Freq: Once | ORAL | Status: AC
  Administered 2013-05-05: 25 mg via ORAL
  Filled 2013-05-05: qty 1

## 2013-05-05 MED ORDER — DEXAMETHASONE 4 MG PO TABS
8.0000 mg | ORAL_TABLET | Freq: Once | ORAL | Status: AC
  Administered 2013-05-05: 8 mg via ORAL
  Filled 2013-05-05: qty 2

## 2013-05-05 NOTE — Discharge Instructions (Signed)
Angioedema Angioedema is a sudden swelling of tissues, often of the skin. It can occur on the face or genitals or in the abdomen or other body parts. The swelling usually develops over a short period and gets better in 24 to 48 hours. It often begins during the night and is found when the person wakes up. The person may also get red, itchy patches of skin (hives). Angioedema can be dangerous if it involves swelling of the air passages.  Depending on the cause, episodes of angioedema may only happen once, come back in unpredictable patterns, or repeat for several years and then gradually fade away.  CAUSES  Angioedema can be caused by an allergic reaction to various triggers. It can also result from nonallergic causes, including reactions to drugs, immune system disorders, viral infections, or an abnormal gene that is passed to you from your parents (hereditary). For some people with angioedema, the cause is unknown.  Some things that can trigger angioedema include:   Foods.   Medicines, such as ACE inhibitors, ARBs, nonsteroidal anti-inflammatory agents, or estrogen.   Latex.   Animal saliva.   Insect stings.   Dyes used in X-rays.   Mild injury.   Dental work.  Surgery.  Stress.   Sudden changes in temperature.   Exercise. SIGNS AND SYMPTOMS   Swelling of the skin.  Hives. If these are present, there is also intense itching.  Redness in the affected area.   Pain in the affected area.  Swollen lips or tongue.  Breathing problems. This may happen if the air passages swell.  Wheezing. If internal organs are involved, there may be:   Nausea.   Abdominal pain.   Vomiting.   Difficulty swallowing.   Difficulty passing urine. DIAGNOSIS   Your health care provider will examine the affected area and take a medical and family history.  Various tests may be done to help determine the cause. Tests may include:  Allergy skin tests to see if the problem  is an allergic reaction.   Blood tests to check for hereditary angioedema.   Tests to check for underlying diseases that could cause the condition.   A review of your medicines, including over the counter medicines, may be done. TREATMENT  Treatment will depend on the cause of the angioedema. Possible treatments include:   Removal of anything that triggered the condition (such as stopping certain medicines).   Medicines to treat symptoms or prevent attacks. Medicines given may include:   Antihistamines.   Epinephrine injection.   Steroids.   Hospitalization may be required for severe attacks. If the air passages are affected, it can be an emergency. Tubes may need to be placed to keep the airway open. HOME CARE INSTRUCTIONS   Only take over-the-counter or prescription medicines as directed by your health care provider.  If you were given medicines for emergency allergy treatment, always carry them with you.  Wear a medical bracelet as directed by your health care provider.   Avoid known triggers. SEEK MEDICAL CARE IF:   You have repeat attacks of angioedema.   Your attacks are more frequent or more severe despite preventive measures.   You have hereditary angioedema and are considering having children. It is important to discuss the risks of passing the condition on to your children with your health care provider. SEEK IMMEDIATE MEDICAL CARE IF:   You have severe swelling of the mouth, tongue, or lips.  You have difficulty breathing.   You have difficulty swallowing.     You faint. MAKE SURE YOU:  Understand these instructions.  Will watch your condition.  Will get help right away if you are not doing well or get worse. Document Released: 04/05/2001 Document Revised: 11/15/2012 Document Reviewed: 09/18/2012 [employer] Patient Information 2014 [employer].  

## 2013-05-05 NOTE — ED Provider Notes (Signed)
CSN: [id number]     Arrival date & time 05/05/13  7628 History  This chart was scribed for Virgel Manifold, MD by Roxan Diesel, ED scribe.  This patient was seen in room APA06/APA06 and the patient's care was started at 9:53 AM.   Chief Complaint  Patient presents with  . Facial Swelling    The history is provided by the patient. No language interpreter was used.    HPI Comments: Carolyn [name] is a 58 y.o. female who presents to the Emergency Department complaining of upper lip swelling that she first noticed this morning.  Pt states she was feeling well last night but this morning on waking at 6:30 AM she could immediately feel swelling to the area.  She has used ice on the area with temporary improvement in swelling.  She states her swelling seems unchanged since onset.  She reports it is "a little uncomfortable" but denies true pain to the area.  She denies tongue swelling, difficulty breathing, wheezing, trouble swallowing, nausea, or lightheadedness.  Pt denies prior h/o similar symptoms.  She has been on lisinopril for some time.  She denies recent medication changes or suspect food intake.  However she does admit to using 2 facial creams together for the first time last night.  She states she has used them separately but never together before.  Pt states she has otherwise been well recently.  PCP is Tommy Medal, MD at [facility]   Past Medical History  Diagnosis Date  . Vaginal dryness     h/o  . 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 Asthma     h/o  . Persistent headaches     h/o  . Coronary artery disease   . Anginal pain     " with exertion "  . Hypertension   . Shortness of breath   . Pneumonia     hx of PNA  . Heart murmur   . GERD (gastroesophageal reflux disease)   . H/O hiatal hernia   . Arthritis     " 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 "    Past Surgical History  Procedure Laterality Date  . Abdominal hysterectomy    . Carpel tunnel surgery    . Colonoscopy      9 years ago   . Cardiac catheterization  06/13/2012  . Bone spur    . Coronary angioplasty  06/13/2012    Family History  Problem Relation Age of Onset  . Breast cancer Other   . Diabetes Mother   . Glaucoma Mother   . Hypertension Mother   . Hypertension Father   . Asthma Father   . Diabetes Sister   . Diabetes Brother     History  Substance Use Topics  . Smoking status: Former Smoker    Quit date: 02/09/1999  . Smokeless tobacco: Never Used  . Alcohol Use: No    OB History   Grav Para Term Preterm Abortions TAB SAB Ect Mult Living   3         3       Review of Systems  All other systems reviewed and are negative.      Allergies  Review of patient's allergies indicates no known allergies.  Home Medications   Current Outpatient Rx  Name  Route  Sig  Dispense  Refill  . aspirin 81 MG tablet   Oral   Take 81 mg by mouth daily.          Marland Kitchen  atorvastatin (LIPITOR) 40 MG tablet   Oral   Take 40 mg by mouth daily at 6 PM.         . cetirizine (ZYRTEC) 10 MG tablet   Oral   Take 10 mg by mouth daily.         . cholecalciferol (VITAMIN D) 1000 UNITS tablet   Oral   Take 1,000 Units by mouth daily.         . lansoprazole (PREVACID) 15 MG capsule   Oral   Take 15 mg by mouth daily as needed (acid reflux and heartburn).          Marland Kitchen lisinopril (PRINIVIL,ZESTRIL) 10 MG tablet   Oral   Take 10 mg by mouth every evening.          . metoprolol tartrate (LOPRESSOR) 25 MG tablet   Oral   Take 25 mg by mouth 2 (two) times daily.         . Omega-3 Fatty Acids (OMEGA 3 PO)   Oral   Take 1 tablet by mouth every evening. Omega Red         . prasugrel (EFFIENT) 10 MG TABS   Oral   Take 1 tablet (10 mg total) by mouth daily.   30 tablet   0   . psyllium (METAMUCIL) 58.6 % packet   Oral   Take 1 packet by mouth at bedtime as needed.          . triamterene-hydrochlorothiazide (MAXZIDE-25) 37.5-25 MG per tablet   Oral   Take 1 tablet by mouth daily.          Marland Kitchen acetaminophen (TYLENOL) 500 MG tablet   Oral   Take 1,000 mg by mouth every 6 (six) hours as needed for mild pain, fever or headache.         . albuterol (PROVENTIL HFA;VENTOLIN HFA) 108 (90 BASE) MCG/ACT inhaler   Inhalation   Inhale 1-2 puffs into the lungs every 4 (four) hours as needed for wheezing or shortness of breath.   1 Inhaler   0   . cyclobenzaprine (FLEXERIL) 10 MG tablet   Oral   Take 10 mg by mouth 3 (three) times daily as needed for muscle spasms.         . nitroGLYCERIN (NITROSTAT) 0.4 MG SL tablet   Sublingual   Place 1 tablet (0.4 mg total) under the tongue every 5 (five) minutes as needed for chest pain.   30 tablet   12    BP 131/65  Pulse 64  Temp(Src) 98.2 F (36.8 C) (Oral)  Resp 18  Ht 5\' 1"  (1.549 m)  Wt 188 lb (85.276 kg)  BMI 35.54 kg/m2  SpO2 100%  Physical Exam  Nursing note and vitals reviewed. Constitutional: She appears well-developed and well-nourished. No distress.  HENT:  Head: Normocephalic and atraumatic.  Mouth/Throat: Oropharynx is clear and moist. No oropharyngeal exudate, posterior oropharyngeal edema or posterior oropharyngeal erythema.  Asymmetric swelling to the upper lip, right worse than left Handling secretions Normal-sounding phonation  Eyes: Conjunctivae are normal. Right eye exhibits no discharge. Left eye exhibits no discharge.  Neck: Neck supple.  Cardiovascular: Normal rate, regular rhythm and normal heart sounds.  Exam reveals no gallop and no friction rub.   No murmur heard. Pulmonary/Chest: Effort normal and breath sounds normal. No respiratory distress. She has no wheezes. She has no rales.  Abdominal: Soft. She exhibits no distension. There is no tenderness.  Musculoskeletal: She exhibits no edema and  no tenderness.  Neurological: She is alert.  Skin: Skin is warm and dry.  Psychiatric: She has a normal mood and affect. Her behavior is normal. Thought content normal.    ED Course  Procedures  (including critical care time)  DIAGNOSTIC STUDIES: Oxygen Saturation is 100% on room air, normal by my interpretation.    COORDINATION OF CARE: 10:05 AM-Discussed treatment plan which includes stopping lisinopril and PCP f/u with pt at bedside and pt agreed to plan.     Labs Review Labs Reviewed - No data to display  Imaging Review No results found.   EKG Interpretation None      MDM   Final diagnoses:  Angioedema of lips    57yF with atraumatic upper lip swelling. No evidence of airway compromise. Symptoms have been stable since first noticed. On ACEI. Instructed to stop and list as allergy. No other obvious precipitant. No hx of similar symptoms. Return precautions discussed.     I personally preformed the services scribed in my presence. The recorded information has been reviewed is accurate. Virgel Manifold, MD.    Virgel Manifold, MD 05/10/13 [phone]

## 2013-05-05 NOTE — ED Notes (Addendum)
Pt states that she woke up this am with top lip swollen, denies any injury, denies any pain. Is currently taking linispril and HCTZ for bp. Denies any problems with speech, swallowing, is able to maintain airway and secretions with no problem

## 2013-06-14 ENCOUNTER — Emergency Department (HOSPITAL_COMMUNITY)
Admission: EM | Admit: 2013-06-14 | Discharge: 2013-06-14 | Disposition: A | Discharge status: Home / Self Care | Payer: BC Managed Care – PPO | Attending: Emergency Medicine | Admitting: Emergency Medicine

## 2013-06-14 ENCOUNTER — Encounter (HOSPITAL_COMMUNITY): Payer: Self-pay | Admitting: Emergency Medicine

## 2013-06-14 ENCOUNTER — Emergency Department (HOSPITAL_COMMUNITY): Payer: BC Managed Care – PPO

## 2013-06-14 DIAGNOSIS — I2510 Atherosclerotic heart disease of native coronary artery without angina pectoris: Secondary | ICD-10-CM | POA: Insufficient documentation

## 2013-06-14 DIAGNOSIS — Z8619 Personal history of other infectious and parasitic diseases: Secondary | ICD-10-CM | POA: Insufficient documentation

## 2013-06-14 DIAGNOSIS — J45909 Unspecified asthma, uncomplicated: Secondary | ICD-10-CM | POA: Insufficient documentation

## 2013-06-14 DIAGNOSIS — Z9861 Coronary angioplasty status: Secondary | ICD-10-CM | POA: Insufficient documentation

## 2013-06-14 DIAGNOSIS — Z8701 Personal history of pneumonia (recurrent): Secondary | ICD-10-CM | POA: Insufficient documentation

## 2013-06-14 DIAGNOSIS — R079 Chest pain, unspecified: Secondary | ICD-10-CM

## 2013-06-14 DIAGNOSIS — M129 Arthropathy, unspecified: Secondary | ICD-10-CM | POA: Insufficient documentation

## 2013-06-14 DIAGNOSIS — I10 Essential (primary) hypertension: Secondary | ICD-10-CM | POA: Insufficient documentation

## 2013-06-14 DIAGNOSIS — Z8742 Personal history of other diseases of the female genital tract: Secondary | ICD-10-CM | POA: Insufficient documentation

## 2013-06-14 DIAGNOSIS — R072 Precordial pain: Secondary | ICD-10-CM | POA: Insufficient documentation

## 2013-06-14 DIAGNOSIS — Z79899 Other long term (current) drug therapy: Secondary | ICD-10-CM | POA: Insufficient documentation

## 2013-06-14 DIAGNOSIS — Z87891 Personal history of nicotine dependence: Secondary | ICD-10-CM | POA: Insufficient documentation

## 2013-06-14 DIAGNOSIS — R011 Cardiac murmur, unspecified: Secondary | ICD-10-CM | POA: Insufficient documentation

## 2013-06-14 DIAGNOSIS — Z7982 Long term (current) use of aspirin: Secondary | ICD-10-CM | POA: Insufficient documentation

## 2013-06-14 DIAGNOSIS — K219 Gastro-esophageal reflux disease without esophagitis: Secondary | ICD-10-CM | POA: Insufficient documentation

## 2013-06-14 LAB — CBC
HCT: 38.7 % (ref 36.0–46.0)
Hemoglobin: 12 g/dL (ref 12.0–15.0)
MCH: 23.5 pg — ABNORMAL LOW (ref 26.0–34.0)
MCHC: 31 g/dL (ref 30.0–36.0)
MCV: 75.9 fL — ABNORMAL LOW (ref 78.0–100.0)
Platelets: 308 10*3/uL (ref 150–400)
RBC: 5.1 MIL/uL (ref 3.87–5.11)
RDW: 17.4 % — ABNORMAL HIGH (ref 11.5–15.5)
WBC: 6.4 10*3/uL (ref 4.0–10.5)

## 2013-06-14 LAB — COMPREHENSIVE METABOLIC PANEL
ALT: 21 U/L (ref 0–35)
AST: 31 U/L (ref 0–37)
Albumin: 3.8 g/dL (ref 3.5–5.2)
Alkaline Phosphatase: 81 U/L (ref 39–117)
BUN: 14 mg/dL (ref 6–23)
CO2: 29 mEq/L (ref 19–32)
Calcium: 9.7 mg/dL (ref 8.4–10.5)
Chloride: 99 mEq/L (ref 96–112)
Creatinine, Ser: 1.12 mg/dL — ABNORMAL HIGH (ref 0.50–1.10)
GFR calc Af Amer: 61 mL/min — ABNORMAL LOW (ref >90)
GFR calc non Af Amer: 53 mL/min — ABNORMAL LOW (ref >90)
Glucose, Bld: 104 mg/dL — ABNORMAL HIGH (ref 70–99)
Potassium: 4 mEq/L (ref 3.7–5.3)
Sodium: 140 mEq/L (ref 137–147)
Total Bilirubin: 0.3 mg/dL (ref 0.3–1.2)
Total Protein: 7.8 g/dL (ref 6.0–8.3)

## 2013-06-14 LAB — PROTIME-INR
INR: 0.93 (ref 0.00–1.49)
Prothrombin Time: 12.3 seconds (ref 11.6–15.2)

## 2013-06-14 LAB — PRO B NATRIURETIC PEPTIDE: Pro B Natriuretic peptide (BNP): 42.4 pg/mL (ref 0–125)

## 2013-06-14 LAB — TROPONIN I
Troponin I: <0.3 ng/mL (ref <0.30)
Troponin I: <0.3 ng/mL (ref <0.30)

## 2013-06-14 LAB — MAGNESIUM: Magnesium: 2 mg/dL (ref 1.5–2.5)

## 2013-06-14 MED ORDER — ASPIRIN 81 MG PO CHEW
324.0000 mg | CHEWABLE_TABLET | Freq: Once | ORAL | Status: DC
  Filled 2013-06-14: qty 4

## 2013-06-14 NOTE — ED Provider Notes (Signed)
CSN: [id number]     Arrival date & time 06/14/13  1828 History  This chart was scribed for Carmin Muskrat, MD by Rolanda Lundborg, ED Scribe. This patient was seen in room APA04/APA04 and the patient's care was started at 7:05 PM.    Chief Complaint  Patient presents with  . Chest Pain   The history is provided by the patient. No language interpreter was used.   HPI Comments: Carolyn [name] is a 58 y.o. female with a h/o CAD who presents to the Emergency Department complaining of intermittent mid-sternal CP described as discomfort that started today while vacuuming and moving furniture around at church after eating chips and a ginger ale. She states she worked out at [employer] before this and had no pain. Pt states she is feeling a little better now. She reports increasing length of time between episodes as the day goes on. She denies associated nausea, vomiting, dyspnea, lightheadedness, or cough. Nothing makes it better or worse. She had stents put in one year ago. She is on Effient, aspirin, blood thinners, and nitrostat and has been compliant with all medications. She denies recent illness or recent travels. She does not smoke or drink.    Past Medical History  Diagnosis Date  . Vaginal dryness     h/o  . 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 Asthma     h/o  . Persistent headaches     h/o  . Coronary artery disease   . Anginal pain     " with exertion "  . Hypertension   . Shortness of breath   . Pneumonia     hx of PNA  . Heart murmur   . GERD (gastroesophageal reflux disease)   . H/O hiatal hernia   . Arthritis     " 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 "   Past Surgical History  Procedure Laterality Date  . Abdominal hysterectomy    . Carpel tunnel surgery    . Colonoscopy      9 years ago  . Cardiac catheterization  06/13/2012  . Bone spur    . Coronary angioplasty  06/13/2012   Family History  Problem Relation Age of Onset  . Breast cancer Other   . Diabetes Mother   . Glaucoma Mother   .  Hypertension Mother   . Hypertension Father   . Asthma Father   . Diabetes Sister   . Diabetes Brother    History  Substance Use Topics  . Smoking status: Former Smoker    Quit date: 02/09/1999  . Smokeless tobacco: Never Used  . Alcohol Use: No   OB History   Grav Para Term Preterm Abortions TAB SAB Ect Mult Living   3         3     Review of Systems  Constitutional:       Per HPI, otherwise negative  HENT:       Per HPI, otherwise negative  Respiratory:       Per HPI, otherwise negative  Cardiovascular:       Per HPI, otherwise negative  Gastrointestinal: Negative for vomiting.  Endocrine:       Negative aside from HPI  Genitourinary:       Neg aside from HPI   Musculoskeletal:       Per HPI, otherwise negative  Skin: Negative.   Neurological: Negative for syncope.      Allergies  Review of patient's allergies indicates no  known allergies.  Home Medications   Prior to Admission medications   Medication Sig Start Date End Date Taking? Authorizing Provider  acetaminophen (TYLENOL) 500 MG tablet Take 1,000 mg by mouth every 6 (six) hours as needed for mild pain, fever or headache.    Historical Provider, MD  albuterol (PROVENTIL HFA;VENTOLIN HFA) 108 (90 BASE) MCG/ACT inhaler Inhale 1-2 puffs into the lungs every 4 (four) hours as needed for wheezing or shortness of breath. 02/01/13   Margarita Mail, PA-C  aspirin 81 MG tablet Take 81 mg by mouth daily.     Historical Provider, MD  atorvastatin (LIPITOR) 40 MG tablet Take 40 mg by mouth daily at 6 PM.    Historical Provider, MD  cetirizine (ZYRTEC) 10 MG tablet Take 10 mg by mouth daily.    Historical Provider, MD  cholecalciferol (VITAMIN D) 1000 UNITS tablet Take 1,000 Units by mouth daily.    Historical Provider, MD  cyclobenzaprine (FLEXERIL) 10 MG tablet Take 10 mg by mouth 3 (three) times daily as needed for muscle spasms.    Historical Provider, MD  lansoprazole (PREVACID) 15 MG capsule Take 15 mg by mouth  daily as needed (acid reflux and heartburn).     Historical Provider, MD  lisinopril (PRINIVIL,ZESTRIL) 10 MG tablet Take 10 mg by mouth every evening.     Historical Provider, MD  metFORMIN (GLUCOPHAGE) 500 MG tablet Take 500 mg by mouth at bedtime.    Historical Provider, MD  metoprolol tartrate (LOPRESSOR) 25 MG tablet Take 25 mg by mouth 2 (two) times daily.    Historical Provider, MD  nitroGLYCERIN (NITROSTAT) 0.4 MG SL tablet Place 1 tablet (0.4 mg total) under the tongue every 5 (five) minutes as needed for chest pain. 06/14/12   Laverda Page, MD  Omega-3 Fatty Acids (OMEGA 3 PO) Take 1 tablet by mouth every evening. Omega Red    Historical Provider, MD  Polyethyl Glycol-Propyl Glycol (SYSTANE) 0.4-0.3 % SOLN Apply 1 drop to eye daily.    Historical Provider, MD  prasugrel (EFFIENT) 10 MG TABS Take 1 tablet (10 mg total) by mouth daily. 06/14/12   Laverda Page, MD  psyllium (METAMUCIL) 58.6 % packet Take 1 packet by mouth at bedtime as needed.     Historical Provider, MD  triamterene-hydrochlorothiazide (MAXZIDE-25) 37.5-25 MG per tablet Take 1 tablet by mouth daily.    Historical Provider, MD   BP 143/64  Temp(Src) 98 F (36.7 C) (Oral)  Resp 16  Ht 5\' 1"  (1.549 m)  Wt 190 lb (86.183 kg)  BMI 35.92 kg/m2  SpO2 100% Physical Exam  Nursing note and vitals reviewed. Constitutional: She is oriented to person, place, and time. She appears well-developed and well-nourished. No distress.  HENT:  Head: Normocephalic and atraumatic.  Eyes: Conjunctivae and EOM are normal.  Cardiovascular: Normal rate and regular rhythm.   Pulmonary/Chest: Effort normal and breath sounds normal. No stridor. No respiratory distress.  Abdominal: She exhibits no distension.  Musculoskeletal: She exhibits no edema.  Neurological: She is alert and oriented to person, place, and time. No cranial nerve deficit.  Skin: Skin is warm and dry.  Psychiatric: She has a normal mood and affect.    ED Course   Procedures (including critical care time) Medications  aspirin chewable tablet 324 mg (324 mg Oral Not Given 06/14/13 1933)    DIAGNOSTIC STUDIES: Oxygen Saturation is 100% on RA, normal by my interpretation.    COORDINATION OF CARE: 7:20 PM- Discussed treatment plan with  pt which includes CXR, blood work, troponin, EKG. Pt agrees to plan.    Labs Review Labs Reviewed  CBC - Abnormal; Notable for the following:    MCV 75.9 (*)    MCH 23.5 (*)    RDW 17.4 (*)    All other components within normal limits  COMPREHENSIVE METABOLIC PANEL - Abnormal; Notable for the following:    Glucose, Bld 104 (*)    Creatinine, Ser 1.12 (*)    GFR calc non Af Amer 53 (*)    GFR calc Af Amer 61 (*)    All other components within normal limits  PRO B NATRIURETIC PEPTIDE  MAGNESIUM  PROTIME-INR  TROPONIN I  TROPONIN I    Imaging Review Dg Chest 2 View  06/14/2013   CLINICAL DATA:  Chest pain  EXAM: CHEST  2 VIEW  COMPARISON:  DG CHEST 2 VIEW dated 02/01/2013  FINDINGS: The heart size and mediastinal contours are within normal limits. Both lungs are clear. The visualized skeletal structures are unremarkable.  IMPRESSION: No active cardiopulmonary disease.   Electronically Signed   By: Kathreen Devoid   On: 06/14/2013 20:42     EKG Interpretation   Date/Time:  Thursday Jun 14 2013 18:36:11 EDT Ventricular Rate:  62 PR Interval:  174 QRS Duration: 84 QT Interval:  418 QTC Calculation: 424 R Axis:   50 Text Interpretation:  Normal sinus rhythm Possible Left atrial enlargement  T wave abnormality, consider inferior ischemia T wave abnormality,  consider anterolateral ischemia Abnormal ECG Sinus rhythm T wave inversion  , new Abnormal ekg Confirmed by Carmin Muskrat  MD [phone]) on 06/14/2013  7:10:11 PM     Update:  On re-exam the patient appears well.  She denies ongoing pain.  10:00 PM Trop #2 normal  MDM    I personally performed the services described in this documentation, which  was scribed in my presence. The recorded information has been reviewed and is accurate.   This patient with a history of cardiac disease presents after episodic sternal chest discomfort earlier today. On exam patient is awake and alert, afebrile, hemodynamically stable, with no evidence of distress. Patient's EKG is mildly different, but the remainder of her evaluation, including stroke components is unremarkable. Patient is on anticoagulant appropriately, and given this, there is low suspicion for atypical ACS. Patient likely has musculoskeletal pain, was discharged in stable condition to follow up with primary care and cardiology.  Carmin Muskrat, MD 06/14/13 2202

## 2013-06-14 NOTE — ED Notes (Signed)
Patient c/o intermittent mid-sternal chest pain that started today while cleaning. Denies any shortness of breath, nausea, vomiting, dizziness, or weakness. Per patient hx of stent placement.

## 2013-06-14 NOTE — Discharge Instructions (Signed)
As discussed, your evaluation today has been largely reassuring.  But, it is important that you monitor your condition carefully, and do not hesitate to return to the ED if you develop new, or concerning changes in your condition.  Otherwise, please follow-up with your physician for appropriate ongoing care.   Chest Pain (Nonspecific) It is often hard to give a specific diagnosis for the cause of chest pain. There is always a chance that your pain could be related to something serious, such as a heart attack or a blood clot in the lungs. You need to follow up with your caregiver for further evaluation. CAUSES   Heartburn.  Pneumonia or bronchitis.  Anxiety or stress.  Inflammation around your heart (pericarditis) or lung (pleuritis or pleurisy).  A blood clot in the lung.  A collapsed lung (pneumothorax). It can develop suddenly on its own (spontaneous pneumothorax) or from injury (trauma) to the chest.  Shingles infection (herpes zoster virus). The chest wall is composed of bones, muscles, and cartilage. Any of these can be the source of the pain.  The bones can be bruised by injury.  The muscles or cartilage can be strained by coughing or overwork.  The cartilage can be affected by inflammation and become sore (costochondritis). DIAGNOSIS  Lab tests or other studies, such as X-rays, electrocardiography, stress testing, or cardiac imaging, may be needed to find the cause of your pain.  TREATMENT   Treatment depends on what may be causing your chest pain. Treatment may include:  Acid blockers for heartburn.  Anti-inflammatory medicine.  Pain medicine for inflammatory conditions.  Antibiotics if an infection is present.  You may be advised to change lifestyle habits. This includes stopping smoking and avoiding alcohol, caffeine, and chocolate.  You may be advised to keep your head raised (elevated) when sleeping. This reduces the chance of acid going backward from your  stomach into your esophagus.  Most of the time, nonspecific chest pain will improve within 2 to 3 days with rest and mild pain medicine. HOME CARE INSTRUCTIONS   If antibiotics were prescribed, take your antibiotics as directed. Finish them even if you start to feel better.  For the next few days, avoid physical activities that bring on chest pain. Continue physical activities as directed.  Do not smoke.  Avoid drinking alcohol.  Only take over-the-counter or prescription medicine for pain, discomfort, or fever as directed by your caregiver.  Follow your caregiver's suggestions for further testing if your chest pain does not go away.  Keep any follow-up appointments you made. If you do not go to an appointment, you could develop lasting (chronic) problems with pain. If there is any problem keeping an appointment, you must call to reschedule. SEEK MEDICAL CARE IF:   You think you are having problems from the medicine you are taking. Read your medicine instructions carefully.  Your chest pain does not go away, even after treatment.  You develop a rash with blisters on your chest. SEEK IMMEDIATE MEDICAL CARE IF:   You have increased chest pain or pain that spreads to your arm, neck, jaw, back, or abdomen.  You develop shortness of breath, an increasing cough, or you are coughing up blood.  You have severe back or abdominal pain, feel nauseous, or vomit.  You develop severe weakness, fainting, or chills.  You have a fever. THIS IS AN EMERGENCY. Do not wait to see if the pain will go away. Get medical help at once. Call your local emergency services (  911 in [place] Do not drive yourself to the hospital. MAKE SURE YOU:   Understand these instructions.  Will watch your condition.  Will get help right away if you are not doing well or get worse. Document Released: 11/04/2004 Document Revised: 04/19/2011 Document Reviewed: 08/31/2007 [facility] Patient Information 2014 [place].

## 2013-11-09 ENCOUNTER — Telehealth: Payer: Self-pay | Admitting: Dietician

## 2013-11-09 NOTE — Telephone Encounter (Signed)
Patient left a message requesting appointment. Returned call and left message for her to call [facility] at [phone].

## 2013-12-10 ENCOUNTER — Encounter (HOSPITAL_COMMUNITY): Payer: Self-pay | Admitting: Emergency Medicine

## 2014-01-04 ENCOUNTER — Ambulatory Visit: Payer: BC Managed Care – PPO | Admitting: Skilled Nursing Facility1

## 2014-01-11 ENCOUNTER — Encounter: Payer: Self-pay | Admitting: Skilled Nursing Facility1

## 2014-01-11 ENCOUNTER — Encounter: Payer: BC Managed Care – PPO | Attending: Internal Medicine | Admitting: Skilled Nursing Facility1

## 2014-01-11 VITALS — Ht 61.0 in | Wt 199.0 lb

## 2014-01-11 DIAGNOSIS — Z713 Dietary counseling and surveillance: Secondary | ICD-10-CM | POA: Insufficient documentation

## 2014-01-11 DIAGNOSIS — R7309 Other abnormal glucose: Secondary | ICD-10-CM | POA: Diagnosis present

## 2014-01-11 DIAGNOSIS — R7303 Prediabetes: Secondary | ICD-10-CM

## 2014-01-11 NOTE — Progress Notes (Signed)
Diabetes Self-Management Education  Visit Type:    Appt. Start Time: 8:00 Appt. End Time: 9:15  01/11/2014  [name], identified by name and date of birth, is a 58 y.o. female with a diagnosis of Diabetes: Type 2.  Other people present during visit: none   ASSESSMENT  Height 5\' 1"  (1.549 m), weight 199 lb (90.266 kg). Body mass index is 37.62 kg/(m^2).   The patient was diagnosed with type 2 diabetes about a year ago and has two personal goals of: weight loss and not needing to be on any medications.  Initial Visit Information:  Are you currently following a meal plan?: No   Are you taking your medications as prescribed?: Yes Are you checking your feet?: Yes How many days per week are you checking your feet?: 7      Psychosocial:     Patient Belief/Attitude about Diabetes: Other (comment) (Pateint feels depressed and discouraged) Patient Concerns: Nutrition/Meal planning, Medication, Glycemic Control Special Needs: None Preferred Learning Style: Auditory Learning Readiness: Ready  Complications:   Last HgB A1C per patient/outside source: 6.6 mg/dL How often do you check your blood sugar?: 1-2 times/day Fasting Blood glucose range (mg/dL): 70-129 Postprandial Blood glucose range (mg/dL): 70-129 Number of hypoglycemic episodes per month: 0 Number of hyperglycemic episodes per week: 0 Have you had a dilated eye exam in the past 12 months?: Yes Have you had a dental exam in the past 12 months?: No  Diet Intake:  Breakfast: coffee with one pack of sugar, cream and more packs of splenda Snack (morning): none Lunch: soupwith vegetables and meat Snack (afternoon): none Dinner: grilled chicken with vegetables Snack (evening): ritz crackers Beverage(s): water, coffee, tea with splenda  Exercise:  Exercise: ADL's  Individualized Plan for Diabetes Self-Management Training:   Learning Objective:  Patient will have a greater understanding of diabetes  self-management.  Patient education plan per assessed needs and concerns is to attend individual sessions for     Education Topics Reviewed with Patient Today:  Definition of diabetes, type 1 and 2, and the diagnosis of diabetes Role of diet in the treatment of diabetes and the relationship between the three main macronutrients and blood glucose level, Food label reading, portion sizes and measuring food., Carbohydrate counting Role of exercise on diabetes management, blood pressure control and cardiac health., Helped patient identify appropriate exercises in relation to his/her diabetes, diabetes complications and other health issue.   Daily foot exams, Yearly dilated eye exam Discussed and identified patients' treatment of hyperglycemia.   Worked with patient to identify barriers to care and solutions   Lifestyle issues that need to be addressed for better diabetes care  PATIENTS GOALS/Plan (Developed by the patient):  Nutrition: Follow meal plan discussed, General guidelines for healthy choices and portions discussed Physical Activity: Exercise 5-7 days per week, 30 minutes per day Medications: take my medication as prescribed Monitoring : test my blood glucose as discussed (note 1 per day at different times throughout the week) Reducing Risk: examine blood glucose patterns Health Coping: discuss diabetes with your husband and friends   Plan:   Goals:  -Follow the meal plan discussed -After discussing it with your doctor try to be physically active thirty minutes a day -Do not weigh yourself more than once a week or none at all -Determine the barriers to your success and work around them  Expected Outcomes:  Other (comment) (she was willing to be educated and understood the education but did not seem optomistic)  Education material provided: Living Well with Diabetes, Meal plan card, Carbohydrate counting sheet and No sodium seasonings  If problems or questions, patient to  contact team via:  Phone  Future DSME appointment: PRN

## 2014-01-14 ENCOUNTER — Ambulatory Visit: Payer: BC Managed Care – PPO | Admitting: Skilled Nursing Facility1

## 2014-01-17 ENCOUNTER — Encounter (HOSPITAL_COMMUNITY): Payer: Self-pay | Admitting: Cardiology

## 2014-04-18 ENCOUNTER — Other Ambulatory Visit: Payer: Self-pay | Admitting: Orthopedic Surgery

## 2014-04-18 DIAGNOSIS — M25511 Pain in right shoulder: Secondary | ICD-10-CM

## 2014-05-14 ENCOUNTER — Ambulatory Visit
Admission: RE | Admit: 2014-05-14 | Discharge: 2014-05-14 | Disposition: A | Discharge status: Home / Self Care | Source: Ambulatory Visit | Attending: Orthopedic Surgery | Admitting: Orthopedic Surgery

## 2014-05-14 DIAGNOSIS — M25511 Pain in right shoulder: Secondary | ICD-10-CM

## 2014-05-14 MED ORDER — IOHEXOL 180 MG/ML  SOLN
15.0000 mL | Freq: Once | INTRAMUSCULAR | Status: AC | PRN
  Administered 2014-05-14: 15 mL via INTRA_ARTICULAR

## 2015-01-16 ENCOUNTER — Other Ambulatory Visit: Payer: Self-pay | Admitting: Obstetrics and Gynecology

## 2015-01-16 DIAGNOSIS — R1032 Left lower quadrant pain: Secondary | ICD-10-CM

## 2015-01-22 ENCOUNTER — Ambulatory Visit
Admission: RE | Admit: 2015-01-22 | Discharge: 2015-01-22 | Disposition: A | Discharge status: Home / Self Care | Source: Ambulatory Visit | Attending: Obstetrics and Gynecology | Admitting: Obstetrics and Gynecology

## 2015-01-22 ENCOUNTER — Other Ambulatory Visit

## 2015-01-22 DIAGNOSIS — R1032 Left lower quadrant pain: Secondary | ICD-10-CM

## 2015-01-22 MED ORDER — IOPAMIDOL (ISOVUE-300) INJECTION 61%
100.0000 mL | Freq: Once | INTRAVENOUS | Status: AC | PRN
  Administered 2015-01-22: 100 mL via INTRAVENOUS

## 2015-02-24 ENCOUNTER — Other Ambulatory Visit: Payer: Self-pay | Admitting: Orthopedic Surgery

## 2015-02-24 DIAGNOSIS — M542 Cervicalgia: Secondary | ICD-10-CM

## 2015-03-01 ENCOUNTER — Ambulatory Visit
Admission: RE | Admit: 2015-03-01 | Discharge: 2015-03-01 | Disposition: A | Discharge status: Home / Self Care | Source: Ambulatory Visit | Attending: Orthopedic Surgery | Admitting: Orthopedic Surgery

## 2015-03-01 DIAGNOSIS — M542 Cervicalgia: Secondary | ICD-10-CM

## 2015-04-09 DIAGNOSIS — I10 Essential (primary) hypertension: Secondary | ICD-10-CM | POA: Diagnosis not present

## 2015-04-09 DIAGNOSIS — E7800 Pure hypercholesterolemia, unspecified: Secondary | ICD-10-CM | POA: Diagnosis not present

## 2015-04-09 DIAGNOSIS — E119 Type 2 diabetes mellitus without complications: Secondary | ICD-10-CM | POA: Diagnosis not present

## 2015-04-09 DIAGNOSIS — I2510 Atherosclerotic heart disease of native coronary artery without angina pectoris: Secondary | ICD-10-CM | POA: Diagnosis not present

## 2015-04-16 DIAGNOSIS — R102 Pelvic and perineal pain: Secondary | ICD-10-CM | POA: Diagnosis not present

## 2015-05-15 DIAGNOSIS — R52 Pain, unspecified: Secondary | ICD-10-CM | POA: Diagnosis not present

## 2015-05-15 DIAGNOSIS — I10 Essential (primary) hypertension: Secondary | ICD-10-CM | POA: Diagnosis not present

## 2015-05-15 DIAGNOSIS — E119 Type 2 diabetes mellitus without complications: Secondary | ICD-10-CM | POA: Diagnosis not present

## 2015-05-15 DIAGNOSIS — Z1389 Encounter for screening for other disorder: Secondary | ICD-10-CM | POA: Diagnosis not present

## 2015-05-15 DIAGNOSIS — E785 Hyperlipidemia, unspecified: Secondary | ICD-10-CM | POA: Diagnosis not present

## 2015-06-11 DIAGNOSIS — M6281 Muscle weakness (generalized): Secondary | ICD-10-CM | POA: Diagnosis not present

## 2015-06-11 DIAGNOSIS — M25552 Pain in left hip: Secondary | ICD-10-CM | POA: Diagnosis not present

## 2015-06-11 DIAGNOSIS — M545 Low back pain: Secondary | ICD-10-CM | POA: Diagnosis not present

## 2015-06-16 DIAGNOSIS — M6281 Muscle weakness (generalized): Secondary | ICD-10-CM | POA: Diagnosis not present

## 2015-06-16 DIAGNOSIS — M545 Low back pain: Secondary | ICD-10-CM | POA: Diagnosis not present

## 2015-06-16 DIAGNOSIS — M25552 Pain in left hip: Secondary | ICD-10-CM | POA: Diagnosis not present

## 2015-06-18 DIAGNOSIS — M545 Low back pain: Secondary | ICD-10-CM | POA: Diagnosis not present

## 2015-06-18 DIAGNOSIS — M25552 Pain in left hip: Secondary | ICD-10-CM | POA: Diagnosis not present

## 2015-06-18 DIAGNOSIS — M6281 Muscle weakness (generalized): Secondary | ICD-10-CM | POA: Diagnosis not present

## 2015-06-23 DIAGNOSIS — M6281 Muscle weakness (generalized): Secondary | ICD-10-CM | POA: Diagnosis not present

## 2015-06-23 DIAGNOSIS — M545 Low back pain: Secondary | ICD-10-CM | POA: Diagnosis not present

## 2015-06-23 DIAGNOSIS — M25552 Pain in left hip: Secondary | ICD-10-CM | POA: Diagnosis not present

## 2015-06-25 DIAGNOSIS — M25552 Pain in left hip: Secondary | ICD-10-CM | POA: Diagnosis not present

## 2015-06-25 DIAGNOSIS — M545 Low back pain: Secondary | ICD-10-CM | POA: Diagnosis not present

## 2015-06-25 DIAGNOSIS — M6281 Muscle weakness (generalized): Secondary | ICD-10-CM | POA: Diagnosis not present

## 2015-07-02 DIAGNOSIS — M545 Low back pain: Secondary | ICD-10-CM | POA: Diagnosis not present

## 2015-07-02 DIAGNOSIS — M25552 Pain in left hip: Secondary | ICD-10-CM | POA: Diagnosis not present

## 2015-07-02 DIAGNOSIS — M6281 Muscle weakness (generalized): Secondary | ICD-10-CM | POA: Diagnosis not present

## 2015-07-09 DIAGNOSIS — M545 Low back pain: Secondary | ICD-10-CM | POA: Diagnosis not present

## 2015-07-09 DIAGNOSIS — M25552 Pain in left hip: Secondary | ICD-10-CM | POA: Diagnosis not present

## 2015-07-09 DIAGNOSIS — M6281 Muscle weakness (generalized): Secondary | ICD-10-CM | POA: Diagnosis not present

## 2015-07-16 DIAGNOSIS — M6281 Muscle weakness (generalized): Secondary | ICD-10-CM | POA: Diagnosis not present

## 2015-07-16 DIAGNOSIS — M25552 Pain in left hip: Secondary | ICD-10-CM | POA: Diagnosis not present

## 2015-07-16 DIAGNOSIS — M545 Low back pain: Secondary | ICD-10-CM | POA: Diagnosis not present

## 2015-07-23 DIAGNOSIS — M25552 Pain in left hip: Secondary | ICD-10-CM | POA: Diagnosis not present

## 2015-07-23 DIAGNOSIS — M6281 Muscle weakness (generalized): Secondary | ICD-10-CM | POA: Diagnosis not present

## 2015-07-23 DIAGNOSIS — M545 Low back pain: Secondary | ICD-10-CM | POA: Diagnosis not present

## 2015-08-08 DIAGNOSIS — E119 Type 2 diabetes mellitus without complications: Secondary | ICD-10-CM | POA: Diagnosis not present

## 2015-08-08 DIAGNOSIS — E785 Hyperlipidemia, unspecified: Secondary | ICD-10-CM | POA: Diagnosis not present

## 2015-08-08 DIAGNOSIS — E1165 Type 2 diabetes mellitus with hyperglycemia: Secondary | ICD-10-CM | POA: Diagnosis not present

## 2015-08-08 DIAGNOSIS — I10 Essential (primary) hypertension: Secondary | ICD-10-CM | POA: Diagnosis not present

## 2015-08-13 DIAGNOSIS — M6281 Muscle weakness (generalized): Secondary | ICD-10-CM | POA: Diagnosis not present

## 2015-08-13 DIAGNOSIS — M25552 Pain in left hip: Secondary | ICD-10-CM | POA: Diagnosis not present

## 2015-08-13 DIAGNOSIS — M545 Low back pain: Secondary | ICD-10-CM | POA: Diagnosis not present

## 2015-08-15 DIAGNOSIS — E1165 Type 2 diabetes mellitus with hyperglycemia: Secondary | ICD-10-CM | POA: Diagnosis not present

## 2015-08-15 DIAGNOSIS — E7800 Pure hypercholesterolemia, unspecified: Secondary | ICD-10-CM | POA: Diagnosis not present

## 2015-08-15 DIAGNOSIS — I10 Essential (primary) hypertension: Secondary | ICD-10-CM | POA: Diagnosis not present

## 2015-08-18 DIAGNOSIS — Z1231 Encounter for screening mammogram for malignant neoplasm of breast: Secondary | ICD-10-CM | POA: Diagnosis not present

## 2015-09-22 DIAGNOSIS — G514 Facial myokymia: Secondary | ICD-10-CM | POA: Diagnosis not present

## 2015-09-22 DIAGNOSIS — H04123 Dry eye syndrome of bilateral lacrimal glands: Secondary | ICD-10-CM | POA: Diagnosis not present

## 2015-11-21 DIAGNOSIS — E7800 Pure hypercholesterolemia, unspecified: Secondary | ICD-10-CM | POA: Diagnosis not present

## 2015-11-21 DIAGNOSIS — I2510 Atherosclerotic heart disease of native coronary artery without angina pectoris: Secondary | ICD-10-CM | POA: Diagnosis not present

## 2015-11-21 DIAGNOSIS — Z9861 Coronary angioplasty status: Secondary | ICD-10-CM | POA: Diagnosis not present

## 2015-11-21 DIAGNOSIS — E119 Type 2 diabetes mellitus without complications: Secondary | ICD-10-CM | POA: Diagnosis not present

## 2015-11-26 ENCOUNTER — Ambulatory Visit (INDEPENDENT_AMBULATORY_CARE_PROVIDER_SITE_OTHER): Payer: Medicare Other | Admitting: Orthopedic Surgery

## 2015-11-26 DIAGNOSIS — M542 Cervicalgia: Secondary | ICD-10-CM

## 2015-11-26 DIAGNOSIS — M25512 Pain in left shoulder: Secondary | ICD-10-CM

## 2015-12-11 DIAGNOSIS — D649 Anemia, unspecified: Secondary | ICD-10-CM | POA: Diagnosis not present

## 2015-12-11 DIAGNOSIS — E1165 Type 2 diabetes mellitus with hyperglycemia: Secondary | ICD-10-CM | POA: Diagnosis not present

## 2015-12-11 DIAGNOSIS — D696 Thrombocytopenia, unspecified: Secondary | ICD-10-CM | POA: Diagnosis not present

## 2015-12-11 DIAGNOSIS — Z79899 Other long term (current) drug therapy: Secondary | ICD-10-CM | POA: Diagnosis not present

## 2015-12-11 DIAGNOSIS — I10 Essential (primary) hypertension: Secondary | ICD-10-CM | POA: Diagnosis not present

## 2015-12-16 DIAGNOSIS — I10 Essential (primary) hypertension: Secondary | ICD-10-CM | POA: Diagnosis not present

## 2015-12-16 DIAGNOSIS — E785 Hyperlipidemia, unspecified: Secondary | ICD-10-CM | POA: Diagnosis not present

## 2015-12-16 DIAGNOSIS — D649 Anemia, unspecified: Secondary | ICD-10-CM | POA: Diagnosis not present

## 2015-12-16 DIAGNOSIS — E1165 Type 2 diabetes mellitus with hyperglycemia: Secondary | ICD-10-CM | POA: Diagnosis not present

## 2015-12-25 DIAGNOSIS — D649 Anemia, unspecified: Secondary | ICD-10-CM | POA: Diagnosis not present

## 2015-12-25 DIAGNOSIS — K219 Gastro-esophageal reflux disease without esophagitis: Secondary | ICD-10-CM | POA: Diagnosis not present

## 2015-12-25 DIAGNOSIS — D508 Other iron deficiency anemias: Secondary | ICD-10-CM | POA: Diagnosis not present

## 2015-12-25 DIAGNOSIS — K5730 Diverticulosis of large intestine without perforation or abscess without bleeding: Secondary | ICD-10-CM | POA: Diagnosis not present

## 2016-01-27 DIAGNOSIS — N951 Menopausal and female climacteric states: Secondary | ICD-10-CM | POA: Diagnosis not present

## 2016-01-27 DIAGNOSIS — Z6835 Body mass index (BMI) 35.0-35.9, adult: Secondary | ICD-10-CM | POA: Diagnosis not present

## 2016-01-27 DIAGNOSIS — Z01419 Encounter for gynecological examination (general) (routine) without abnormal findings: Secondary | ICD-10-CM | POA: Diagnosis not present

## 2016-02-23 DIAGNOSIS — K5900 Constipation, unspecified: Secondary | ICD-10-CM | POA: Diagnosis not present

## 2016-02-23 DIAGNOSIS — M6281 Muscle weakness (generalized): Secondary | ICD-10-CM | POA: Diagnosis not present

## 2016-02-23 DIAGNOSIS — R102 Pelvic and perineal pain: Secondary | ICD-10-CM | POA: Diagnosis not present

## 2016-02-23 DIAGNOSIS — M62838 Other muscle spasm: Secondary | ICD-10-CM | POA: Diagnosis not present

## 2016-03-03 DIAGNOSIS — M6281 Muscle weakness (generalized): Secondary | ICD-10-CM | POA: Diagnosis not present

## 2016-03-03 DIAGNOSIS — R102 Pelvic and perineal pain: Secondary | ICD-10-CM | POA: Diagnosis not present

## 2016-03-03 DIAGNOSIS — M62838 Other muscle spasm: Secondary | ICD-10-CM | POA: Diagnosis not present

## 2016-03-03 DIAGNOSIS — K5900 Constipation, unspecified: Secondary | ICD-10-CM | POA: Diagnosis not present

## 2016-03-08 DIAGNOSIS — R102 Pelvic and perineal pain: Secondary | ICD-10-CM | POA: Diagnosis not present

## 2016-03-08 DIAGNOSIS — M62838 Other muscle spasm: Secondary | ICD-10-CM | POA: Diagnosis not present

## 2016-03-08 DIAGNOSIS — M6281 Muscle weakness (generalized): Secondary | ICD-10-CM | POA: Diagnosis not present

## 2016-03-11 DIAGNOSIS — J0100 Acute maxillary sinusitis, unspecified: Secondary | ICD-10-CM | POA: Diagnosis not present

## 2016-03-16 DIAGNOSIS — M6281 Muscle weakness (generalized): Secondary | ICD-10-CM | POA: Diagnosis not present

## 2016-03-16 DIAGNOSIS — R102 Pelvic and perineal pain: Secondary | ICD-10-CM | POA: Diagnosis not present

## 2016-03-16 DIAGNOSIS — M62838 Other muscle spasm: Secondary | ICD-10-CM | POA: Diagnosis not present

## 2016-03-16 DIAGNOSIS — K5900 Constipation, unspecified: Secondary | ICD-10-CM | POA: Diagnosis not present

## 2016-03-22 DIAGNOSIS — M62838 Other muscle spasm: Secondary | ICD-10-CM | POA: Diagnosis not present

## 2016-03-22 DIAGNOSIS — R102 Pelvic and perineal pain: Secondary | ICD-10-CM | POA: Diagnosis not present

## 2016-03-22 DIAGNOSIS — M6281 Muscle weakness (generalized): Secondary | ICD-10-CM | POA: Diagnosis not present

## 2016-03-22 DIAGNOSIS — K5900 Constipation, unspecified: Secondary | ICD-10-CM | POA: Diagnosis not present

## 2016-04-01 DIAGNOSIS — K5900 Constipation, unspecified: Secondary | ICD-10-CM | POA: Diagnosis not present

## 2016-04-01 DIAGNOSIS — R102 Pelvic and perineal pain: Secondary | ICD-10-CM | POA: Diagnosis not present

## 2016-04-01 DIAGNOSIS — M6281 Muscle weakness (generalized): Secondary | ICD-10-CM | POA: Diagnosis not present

## 2016-04-01 DIAGNOSIS — M62838 Other muscle spasm: Secondary | ICD-10-CM | POA: Diagnosis not present

## 2016-04-12 DIAGNOSIS — R102 Pelvic and perineal pain: Secondary | ICD-10-CM | POA: Diagnosis not present

## 2016-04-12 DIAGNOSIS — K5900 Constipation, unspecified: Secondary | ICD-10-CM | POA: Diagnosis not present

## 2016-04-12 DIAGNOSIS — M62838 Other muscle spasm: Secondary | ICD-10-CM | POA: Diagnosis not present

## 2016-04-12 DIAGNOSIS — M6281 Muscle weakness (generalized): Secondary | ICD-10-CM | POA: Diagnosis not present

## 2016-04-20 DIAGNOSIS — R102 Pelvic and perineal pain: Secondary | ICD-10-CM | POA: Diagnosis not present

## 2016-04-20 DIAGNOSIS — M1990 Unspecified osteoarthritis, unspecified site: Secondary | ICD-10-CM | POA: Diagnosis not present

## 2016-04-20 DIAGNOSIS — M62838 Other muscle spasm: Secondary | ICD-10-CM | POA: Diagnosis not present

## 2016-04-20 DIAGNOSIS — M6281 Muscle weakness (generalized): Secondary | ICD-10-CM | POA: Diagnosis not present

## 2016-05-11 ENCOUNTER — Telehealth (INDEPENDENT_AMBULATORY_CARE_PROVIDER_SITE_OTHER): Payer: Self-pay | Admitting: *Deleted

## 2016-05-11 DIAGNOSIS — D649 Anemia, unspecified: Secondary | ICD-10-CM | POA: Diagnosis not present

## 2016-05-11 DIAGNOSIS — E559 Vitamin D deficiency, unspecified: Secondary | ICD-10-CM | POA: Diagnosis not present

## 2016-05-11 DIAGNOSIS — E1165 Type 2 diabetes mellitus with hyperglycemia: Secondary | ICD-10-CM | POA: Diagnosis not present

## 2016-05-11 DIAGNOSIS — I10 Essential (primary) hypertension: Secondary | ICD-10-CM | POA: Diagnosis not present

## 2016-05-11 NOTE — Telephone Encounter (Signed)
I called patient to make her aware her CD of her neck x-rays was ready.  She also requested the MRI of her neck and I gave her the number to [facility] for release of MRI.  Patient is also requesting medical records as well, records for her neck may be found in [facility].  CD will be placed up front for pickup, if medical records could be added as well.

## 2016-05-11 NOTE — Telephone Encounter (Signed)
Pt asking for Xray CD. Pt will pick it up

## 2016-05-13 ENCOUNTER — Telehealth (INDEPENDENT_AMBULATORY_CARE_PROVIDER_SITE_OTHER): Payer: Self-pay | Admitting: Orthopedic Surgery

## 2016-05-13 NOTE — Telephone Encounter (Signed)
Patient has signed a medical records form. And I have called her and left a message advising that copy of records & xray CD are ready to pick up.

## 2016-05-13 NOTE — Telephone Encounter (Signed)
I CALLED PATIENT AND LEFT MSG., ADVISED THAT COPY OF RECORDS & XRAY CD ARE READY TO PICK UP.

## 2016-05-17 DIAGNOSIS — R739 Hyperglycemia, unspecified: Secondary | ICD-10-CM | POA: Diagnosis not present

## 2016-05-17 DIAGNOSIS — E7800 Pure hypercholesterolemia, unspecified: Secondary | ICD-10-CM | POA: Diagnosis not present

## 2016-05-17 DIAGNOSIS — I10 Essential (primary) hypertension: Secondary | ICD-10-CM | POA: Diagnosis not present

## 2016-05-17 DIAGNOSIS — L723 Sebaceous cyst: Secondary | ICD-10-CM | POA: Diagnosis not present

## 2016-05-26 DIAGNOSIS — M5412 Radiculopathy, cervical region: Secondary | ICD-10-CM | POA: Diagnosis not present

## 2016-05-27 DIAGNOSIS — M542 Cervicalgia: Secondary | ICD-10-CM | POA: Diagnosis not present

## 2016-05-27 DIAGNOSIS — M4802 Spinal stenosis, cervical region: Secondary | ICD-10-CM | POA: Diagnosis not present

## 2016-05-27 DIAGNOSIS — M47812 Spondylosis without myelopathy or radiculopathy, cervical region: Secondary | ICD-10-CM | POA: Diagnosis not present

## 2016-06-09 DIAGNOSIS — M4310 Spondylolisthesis, site unspecified: Secondary | ICD-10-CM | POA: Diagnosis not present

## 2016-06-09 DIAGNOSIS — M47812 Spondylosis without myelopathy or radiculopathy, cervical region: Secondary | ICD-10-CM | POA: Diagnosis not present

## 2016-06-09 DIAGNOSIS — M5412 Radiculopathy, cervical region: Secondary | ICD-10-CM | POA: Diagnosis not present

## 2016-06-14 DIAGNOSIS — Z5181 Encounter for therapeutic drug level monitoring: Secondary | ICD-10-CM | POA: Diagnosis not present

## 2016-06-17 DIAGNOSIS — I10 Essential (primary) hypertension: Secondary | ICD-10-CM | POA: Diagnosis not present

## 2016-06-17 DIAGNOSIS — M542 Cervicalgia: Secondary | ICD-10-CM | POA: Diagnosis not present

## 2016-06-17 DIAGNOSIS — E1165 Type 2 diabetes mellitus with hyperglycemia: Secondary | ICD-10-CM | POA: Diagnosis not present

## 2016-06-17 DIAGNOSIS — G43909 Migraine, unspecified, not intractable, without status migrainosus: Secondary | ICD-10-CM | POA: Diagnosis not present

## 2016-06-28 DIAGNOSIS — G4700 Insomnia, unspecified: Secondary | ICD-10-CM | POA: Diagnosis not present

## 2016-06-28 DIAGNOSIS — I2510 Atherosclerotic heart disease of native coronary artery without angina pectoris: Secondary | ICD-10-CM | POA: Diagnosis not present

## 2016-06-28 DIAGNOSIS — Z87891 Personal history of nicotine dependence: Secondary | ICD-10-CM | POA: Diagnosis not present

## 2016-06-28 DIAGNOSIS — M4722 Other spondylosis with radiculopathy, cervical region: Secondary | ICD-10-CM | POA: Diagnosis not present

## 2016-06-28 DIAGNOSIS — Z7982 Long term (current) use of aspirin: Secondary | ICD-10-CM | POA: Diagnosis not present

## 2016-06-28 DIAGNOSIS — Z90710 Acquired absence of both cervix and uterus: Secondary | ICD-10-CM | POA: Diagnosis not present

## 2016-06-28 DIAGNOSIS — Z7984 Long term (current) use of oral hypoglycemic drugs: Secondary | ICD-10-CM | POA: Diagnosis not present

## 2016-06-28 DIAGNOSIS — I10 Essential (primary) hypertension: Secondary | ICD-10-CM | POA: Diagnosis not present

## 2016-06-28 DIAGNOSIS — E785 Hyperlipidemia, unspecified: Secondary | ICD-10-CM | POA: Diagnosis not present

## 2016-06-28 DIAGNOSIS — Z955 Presence of coronary angioplasty implant and graft: Secondary | ICD-10-CM | POA: Diagnosis not present

## 2016-06-28 DIAGNOSIS — R9431 Abnormal electrocardiogram [ECG] [EKG]: Secondary | ICD-10-CM | POA: Diagnosis not present

## 2016-06-28 DIAGNOSIS — E119 Type 2 diabetes mellitus without complications: Secondary | ICD-10-CM | POA: Diagnosis not present

## 2016-06-28 DIAGNOSIS — K219 Gastro-esophageal reflux disease without esophagitis: Secondary | ICD-10-CM | POA: Diagnosis not present

## 2016-06-28 DIAGNOSIS — G43909 Migraine, unspecified, not intractable, without status migrainosus: Secondary | ICD-10-CM | POA: Diagnosis not present

## 2016-06-30 DIAGNOSIS — M5412 Radiculopathy, cervical region: Secondary | ICD-10-CM | POA: Diagnosis not present

## 2016-07-14 DIAGNOSIS — Z90710 Acquired absence of both cervix and uterus: Secondary | ICD-10-CM | POA: Diagnosis not present

## 2016-07-14 DIAGNOSIS — Z955 Presence of coronary angioplasty implant and graft: Secondary | ICD-10-CM | POA: Diagnosis not present

## 2016-07-14 DIAGNOSIS — G4700 Insomnia, unspecified: Secondary | ICD-10-CM | POA: Diagnosis present

## 2016-07-14 DIAGNOSIS — Z7982 Long term (current) use of aspirin: Secondary | ICD-10-CM | POA: Diagnosis not present

## 2016-07-14 DIAGNOSIS — E119 Type 2 diabetes mellitus without complications: Secondary | ICD-10-CM | POA: Diagnosis present

## 2016-07-14 DIAGNOSIS — K219 Gastro-esophageal reflux disease without esophagitis: Secondary | ICD-10-CM | POA: Diagnosis present

## 2016-07-14 DIAGNOSIS — M50323 Other cervical disc degeneration at C6-C7 level: Secondary | ICD-10-CM | POA: Diagnosis not present

## 2016-07-14 DIAGNOSIS — M50322 Other cervical disc degeneration at C5-C6 level: Secondary | ICD-10-CM | POA: Diagnosis not present

## 2016-07-14 DIAGNOSIS — M5033 Other cervical disc degeneration, cervicothoracic region: Secondary | ICD-10-CM | POA: Diagnosis not present

## 2016-07-14 DIAGNOSIS — I10 Essential (primary) hypertension: Secondary | ICD-10-CM | POA: Diagnosis present

## 2016-07-14 DIAGNOSIS — Z87891 Personal history of nicotine dependence: Secondary | ICD-10-CM | POA: Diagnosis not present

## 2016-07-14 DIAGNOSIS — E785 Hyperlipidemia, unspecified: Secondary | ICD-10-CM | POA: Diagnosis present

## 2016-07-14 DIAGNOSIS — M5412 Radiculopathy, cervical region: Secondary | ICD-10-CM | POA: Diagnosis not present

## 2016-07-14 DIAGNOSIS — G43909 Migraine, unspecified, not intractable, without status migrainosus: Secondary | ICD-10-CM | POA: Diagnosis present

## 2016-07-14 DIAGNOSIS — Z7984 Long term (current) use of oral hypoglycemic drugs: Secondary | ICD-10-CM | POA: Diagnosis not present

## 2016-07-14 DIAGNOSIS — M5031 Other cervical disc degeneration,  high cervical region: Secondary | ICD-10-CM | POA: Diagnosis not present

## 2016-07-14 DIAGNOSIS — I2510 Atherosclerotic heart disease of native coronary artery without angina pectoris: Secondary | ICD-10-CM | POA: Diagnosis present

## 2016-07-14 DIAGNOSIS — M4722 Other spondylosis with radiculopathy, cervical region: Secondary | ICD-10-CM | POA: Diagnosis present

## 2016-07-14 DIAGNOSIS — Z981 Arthrodesis status: Secondary | ICD-10-CM | POA: Diagnosis not present

## 2016-08-10 DIAGNOSIS — E1165 Type 2 diabetes mellitus with hyperglycemia: Secondary | ICD-10-CM | POA: Diagnosis not present

## 2016-08-10 DIAGNOSIS — I10 Essential (primary) hypertension: Secondary | ICD-10-CM | POA: Diagnosis not present

## 2016-08-17 DIAGNOSIS — I10 Essential (primary) hypertension: Secondary | ICD-10-CM | POA: Diagnosis not present

## 2016-08-17 DIAGNOSIS — E1165 Type 2 diabetes mellitus with hyperglycemia: Secondary | ICD-10-CM | POA: Diagnosis not present

## 2016-08-17 DIAGNOSIS — E559 Vitamin D deficiency, unspecified: Secondary | ICD-10-CM | POA: Diagnosis not present

## 2016-08-25 DIAGNOSIS — M50323 Other cervical disc degeneration at C6-C7 level: Secondary | ICD-10-CM | POA: Diagnosis not present

## 2016-08-25 DIAGNOSIS — M50322 Other cervical disc degeneration at C5-C6 level: Secondary | ICD-10-CM | POA: Diagnosis not present

## 2016-08-25 DIAGNOSIS — M47812 Spondylosis without myelopathy or radiculopathy, cervical region: Secondary | ICD-10-CM | POA: Diagnosis not present

## 2016-09-28 DIAGNOSIS — H5203 Hypermetropia, bilateral: Secondary | ICD-10-CM | POA: Diagnosis not present

## 2016-09-28 DIAGNOSIS — H52223 Regular astigmatism, bilateral: Secondary | ICD-10-CM | POA: Diagnosis not present

## 2016-09-28 DIAGNOSIS — Z7984 Long term (current) use of oral hypoglycemic drugs: Secondary | ICD-10-CM | POA: Diagnosis not present

## 2016-09-28 DIAGNOSIS — E119 Type 2 diabetes mellitus without complications: Secondary | ICD-10-CM | POA: Diagnosis not present

## 2016-09-28 DIAGNOSIS — H524 Presbyopia: Secondary | ICD-10-CM | POA: Diagnosis not present

## 2016-10-13 DIAGNOSIS — M50323 Other cervical disc degeneration at C6-C7 level: Secondary | ICD-10-CM | POA: Diagnosis not present

## 2016-10-13 DIAGNOSIS — M47812 Spondylosis without myelopathy or radiculopathy, cervical region: Secondary | ICD-10-CM | POA: Diagnosis not present

## 2016-10-13 DIAGNOSIS — M50322 Other cervical disc degeneration at C5-C6 level: Secondary | ICD-10-CM | POA: Diagnosis not present

## 2016-10-18 DIAGNOSIS — Z23 Encounter for immunization: Secondary | ICD-10-CM | POA: Diagnosis not present

## 2017-01-04 DIAGNOSIS — M5442 Lumbago with sciatica, left side: Secondary | ICD-10-CM | POA: Diagnosis not present

## 2017-01-14 DIAGNOSIS — I2510 Atherosclerotic heart disease of native coronary artery without angina pectoris: Secondary | ICD-10-CM | POA: Diagnosis not present

## 2017-01-14 DIAGNOSIS — E7800 Pure hypercholesterolemia, unspecified: Secondary | ICD-10-CM | POA: Diagnosis not present

## 2017-01-14 DIAGNOSIS — E119 Type 2 diabetes mellitus without complications: Secondary | ICD-10-CM | POA: Diagnosis not present

## 2017-01-14 DIAGNOSIS — Z9861 Coronary angioplasty status: Secondary | ICD-10-CM | POA: Diagnosis not present

## 2017-01-15 ENCOUNTER — Encounter (HOSPITAL_COMMUNITY): Payer: Self-pay | Admitting: Emergency Medicine

## 2017-01-15 ENCOUNTER — Ambulatory Visit (HOSPITAL_COMMUNITY)
Admission: EM | Admit: 2017-01-15 | Discharge: 2017-01-15 | Disposition: A | Discharge status: Home / Self Care | Attending: Emergency Medicine | Admitting: Emergency Medicine

## 2017-01-15 DIAGNOSIS — T148XXA Other injury of unspecified body region, initial encounter: Secondary | ICD-10-CM | POA: Diagnosis not present

## 2017-01-15 DIAGNOSIS — M79605 Pain in left leg: Secondary | ICD-10-CM | POA: Diagnosis not present

## 2017-01-15 MED ORDER — TRIAMCINOLONE ACETONIDE 40 MG/ML IJ SUSP
INTRAMUSCULAR | Status: AC
  Filled 2017-01-15: qty 1

## 2017-01-15 MED ORDER — HYDROCODONE-ACETAMINOPHEN 5-325 MG PO TABS: 1.0000 | ORAL_TABLET | ORAL | 0 refills | Status: DC | PRN

## 2017-01-15 MED ORDER — TRIAMCINOLONE ACETONIDE 40 MG/ML IJ SUSP
40.0000 mg | Freq: Once | INTRAMUSCULAR | Status: AC
  Administered 2017-01-15: 40 mg via INTRAMUSCULAR

## 2017-01-15 NOTE — ED Triage Notes (Signed)
Pt c/o left leg pain that radiates from her hip onset last Sunday. Was seen by PCP and told it was sciatica. Also had a massage and still having pain. Having trouble sitting still and putting foot on the floor.

## 2017-01-15 NOTE — ED Provider Notes (Signed)
[place]    CSN: [id number] Arrival date & time: 01/15/17  1405     History   Chief Complaint Chief Complaint  Patient presents with  . Leg Pain    HPI Carolyn [name] is a 61 y.o. female.   61 year old female complaining of left leg pain for 2 weeks. She states the day prior to onset she was placing bags under tables. The following day she awoke with pain to the left leg primarily lateral aspect of the left thigh. The pain has been often known but more present than not. Sometimes worse with weightbearing and ambulation. She was seen by PCP proximal with 4 days ago and told had sciatica, take NSAIDs and use ice. She is had to massage since that time as well. Continues to have pain. Patient able to reproduce the pain with straight leg raise and partial abduction.      Past Medical History:  Diagnosis Date  . Anginal pain ([place])    " with exertion "  . Arthritis    " 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 "  . Asthma    h/o  . Coronary artery disease   . GERD (gastroesophageal reflux disease)   . H/O hiatal hernia   . Heart murmur   . 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 Hypertension   . Persistent headaches    h/o  . Pneumonia    hx of PNA  . Shortness of breath   . Vaginal dryness    h/o    Patient Active Problem List   Diagnosis Date Noted  . Angina pectoris ([place]) 06/14/2012  . CAD (coronary artery disease), native coronary artery 06/14/2012  . S/P PTCA (percutaneous transluminal coronary angioplasty) 06/14/2012  . Hyperlipidemia 06/14/2012  . Essential hypertension 06/14/2012  . Pre-diabetes 06/14/2012    Past Surgical History:  Procedure Laterality Date  . ABDOMINAL HYSTERECTOMY    . bone spur    . CARDIAC CATHETERIZATION  06/13/2012  . carpel tunnel surgery    . COLONOSCOPY     9 years ago  . CORONARY ANGIOPLASTY  06/13/2012  . LEFT HEART CATHETERIZATION WITH CORONARY ANGIOGRAM N/A 06/13/2012   Procedure: LEFT HEART CATHETERIZATION WITH CORONARY ANGIOGRAM;  Surgeon:  Laverda Page, MD;  Location: [facility] CATH LAB;  Service: Cardiovascular;  Laterality: N/A;    OB History    Gravida Para Term Preterm AB Living   3         3   SAB TAB Ectopic Multiple Live Births                   Home Medications    Prior to Admission medications   Medication Sig Start Date End Date Taking? Authorizing Provider  acetaminophen (TYLENOL) 500 MG tablet Take 1,000 mg by mouth every 6 (six) hours as needed for mild pain, fever or headache.   Yes [provider]  albuterol (PROVENTIL HFA;VENTOLIN HFA) 108 (90 BASE) MCG/ACT inhaler Inhale 1-2 puffs into the lungs every 4 (four) hours as needed for wheezing or shortness of breath. 02/01/13  Yes Harris, Abigail, PA-C  amLODipine (NORVASC) 2.5 MG tablet Take 2.5 mg by mouth daily.   Yes [provider]  aspirin EC 81 MG tablet Take 81 mg by mouth daily.   Yes [provider]  atorvastatin (LIPITOR) 40 MG tablet Take 40 mg by mouth daily at 6 PM.   Yes [provider]  cetirizine (ZYRTEC) 10 MG tablet Take 10 mg  by mouth daily.   Yes [provider]  cholecalciferol (VITAMIN D) 1000 UNITS tablet Take 1,000 Units by mouth daily.   Yes [provider]  CHROMIUM PO Take by mouth.   Yes [provider]  co-enzyme Q-10 30 MG capsule Take 30 mg by mouth 3 (three) times daily.   Yes [provider]  cyclobenzaprine (FLEXERIL) 10 MG tablet Take 10 mg by mouth 3 (three) times daily as needed for muscle spasms.   Yes [provider]  esomeprazole (NEXIUM) 10 MG packet Take 10 mg by mouth daily before breakfast.   Yes [provider]  lansoprazole (PREVACID) 15 MG capsule Take 15 mg by mouth daily as needed (acid reflux and heartburn).    Yes [provider]  metFORMIN (GLUCOPHAGE) 500 MG tablet Take 500 mg by mouth at bedtime.   Yes [provider]  metoprolol tartrate (LOPRESSOR) 25 MG tablet Take 25 mg by mouth 2 (two) times daily.    Yes [provider]  nitroGLYCERIN (NITROSTAT) 0.4 MG SL tablet Place 1 tablet (0.4 mg total) under the tongue every 5 (five) minutes as needed for chest pain. 06/14/12  Yes Adrian Prows, MD  Omega-3 Fatty Acids (OMEGA 3 PO) Take 1 tablet by mouth every evening. Omega Red   Yes [provider]  Polyethyl Glycol-Propyl Glycol (SYSTANE) 0.4-0.3 % SOLN Apply 1 drop to eye daily.   Yes [provider]  prasugrel (EFFIENT) 10 MG TABS Take 1 tablet (10 mg total) by mouth daily. 06/14/12  Yes Adrian Prows, MD  psyllium (METAMUCIL) 58.6 % packet Take 1 packet by mouth at bedtime as needed.    Yes [provider]  triamterene-hydrochlorothiazide (MAXZIDE-25) 37.5-25 MG per tablet Take 1 tablet by mouth daily.   Yes [provider]  HYDROcodone-acetaminophen (NORCO/VICODIN) 5-325 MG tablet Take 1 tablet by mouth every 4 (four) hours as needed. 01/15/17   Janne Napoleon, NP    Family History Family History  Problem Relation Age of Onset  . Breast cancer Other   . Diabetes Mother   . Glaucoma Mother   . Hypertension Mother   . Hypertension Father   . Asthma Father   . Diabetes Sister   . Diabetes Brother     Social History Social History   Tobacco Use  . Smoking status: Former Smoker    Last attempt to quit: 02/09/1999    Years since quitting: 17.9  . Smokeless tobacco: Never Used  Substance Use Topics  . Alcohol use: No  . Drug use: No     Allergies   Patient has no known allergies.   Review of Systems Review of Systems  Constitutional: Negative.  Negative for activity change, chills and fever.  HENT: Negative.   Respiratory: Negative.   Cardiovascular: Negative.   Musculoskeletal:       As per HPI  Skin: Negative for color change, pallor and rash.  Neurological: Negative.   All other systems reviewed and are negative.    Physical Exam Triage Vital Signs ED Triage Vitals  Enc Vitals Group     BP 01/15/17 1517 (!) 152/57     Pulse Rate  01/15/17 1517 (!) 55     Resp --      Temp 01/15/17 1517 98.6 F (37 C)     Temp Source 01/15/17 1517 Oral     SpO2 01/15/17 1517 100 %     Weight --      Height --  Head Circumference --      Peak Flow --      Pain Score 01/15/17 1519 7     Pain Loc --      Pain Edu? --      Excl. in [place]? --    No data found.  Updated Vital Signs BP (!) 152/57 (BP Location: Left Arm)   Pulse (!) 55   Temp 98.6 F (37 C) (Oral)   SpO2 100%   Visual Acuity Right Eye Distance:   Left Eye Distance:   Bilateral Distance:    Right Eye Near:   Left Eye Near:    Bilateral Near:     Physical Exam  Constitutional: She is oriented to person, place, and time. She appears well-developed and well-nourished. No distress.  HENT:  Head: Normocephalic and atraumatic.  Eyes: EOM are normal. Pupils are equal, round, and reactive to light.  Neck: Normal range of motion. Neck supple.  Musculoskeletal: She exhibits no edema or deformity.  Directly tenderness over the left lateral thigh. Tenderness over the tensor fasciae latae and vastus lateralis. Pain radiates laterally and inferiorly to the knee. Passive range of motion to the hip does not reproduce pain. Has full range of motion of the hip. Pole range of motion of the knee. Distal neurovascular motor sensory is grossly intact. No anterior thigh tenderness. No back tenderness.  Lymphadenopathy:    She has no cervical adenopathy.  Neurological: She is alert and oriented to person, place, and time. No cranial nerve deficit.  Skin: Skin is warm and dry.     UC Treatments / Results  Labs (all labs ordered are listed, but only abnormal results are displayed) Labs Reviewed - No data to display  EKG  EKG Interpretation None       Radiology No results found.  Procedures Procedures (including critical care time)  Medications Ordered in UC Medications  triamcinolone acetonide (KENALOG-40) injection 40 mg (not administered)     Initial  Impression / Assessment and Plan / UC Course  I have reviewed the triage vital signs and the nursing notes.  Pertinent labs & imaging results that were available during my care of the patient were reviewed by me and considered in my medical decision making (see chart for details).    Continue with heat, Take meds as direct    Final Clinical Impressions(s) / UC Diagnoses   Final diagnoses:  Left leg pain  Muscle strain    ED Discharge Orders        Ordered    HYDROcodone-acetaminophen (NORCO/VICODIN) 5-325 MG tablet  Every 4 hours PRN     01/15/17 1632       Controlled Substance Prescriptions [place] Controlled Substance Registry consulted? No   Janne Napoleon, NP 01/15/17 [phone]

## 2017-01-15 NOTE — Discharge Instructions (Signed)
Continue with heat, Take meds as directed, may cause drowsiness.

## 2017-01-26 ENCOUNTER — Ambulatory Visit (INDEPENDENT_AMBULATORY_CARE_PROVIDER_SITE_OTHER): Payer: Medicare Other | Admitting: Orthopedic Surgery

## 2017-01-26 ENCOUNTER — Encounter (INDEPENDENT_AMBULATORY_CARE_PROVIDER_SITE_OTHER): Payer: Self-pay | Admitting: Orthopedic Surgery

## 2017-01-26 ENCOUNTER — Ambulatory Visit (INDEPENDENT_AMBULATORY_CARE_PROVIDER_SITE_OTHER): Payer: Medicare Other

## 2017-01-26 DIAGNOSIS — M25552 Pain in left hip: Secondary | ICD-10-CM

## 2017-01-26 MED ORDER — METHOCARBAMOL 500 MG PO TABS: 500.0000 mg | ORAL_TABLET | Freq: Two times a day (BID) | ORAL | 0 refills | Status: DC | PRN

## 2017-01-26 MED ORDER — MELOXICAM 15 MG PO TABS: ORAL_TABLET | ORAL | 0 refills | Status: DC

## 2017-01-29 NOTE — Progress Notes (Signed)
Office Visit Note   Patient: Carolyn [name]           Date of Birth: Oct 05, 1955           MRN: [id number] Visit Date: 01/26/2017 Requested by: [name], MD [address] [place], [place] [address] PCP: [name], MD  Subjective: Chief Complaint  Patient presents with  . Left Hip - Pain    HPI: Patient presents with left leg pain.  Denies any history of injury.  Going on since Thanksgiving.  She states that she has fallen on occasion due to the leg giving way.  She does report some left-sided groin pain but no numbness and tingling.  The pain will radiate into the thigh.  Occasionally the pain will radiate down below the knee.  She has had one injection plus hydrocodone.  The back pain went away after those interventions.  She does report groin pain with walking.  She has diabetes and cannot take steroids.              ROS: All systems reviewed are negative as they relate to the chief complaint within the history of present illness.  Patient denies  fevers or chills.   Assessment & Plan: Visit Diagnoses:  1. Pain in left hip     Plan: Impression is left hip pain with radiographic pubic symphysis.  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  Plan for now is scription for Mobic and Robaxin.  We will consider injection with Dr. Ernestina Patches next for diagnostic and therapeutic purposes.  Follow-Up Instructions: Return if symptoms worsen or fail to improve.   Orders:  Orders Placed This Encounter  Procedures  . XR HIP UNILAT W OR W/O PELVIS 2-3 VIEWS LEFT   Meds ordered this encounter  Medications  . meloxicam (MOBIC) 15 MG tablet    Sig: 1 po q d x 3 weeks    Dispense:  21 tablet    Refill:  0  . methocarbamol (ROBAXIN) 500 MG tablet    Sig: Take 1 tablet (500 mg total) by mouth 2 (two) times daily as needed for muscle spasms.    Dispense:  30 tablet    Refill:  0      Procedures: No procedures performed   Clinical Data: No additional  findings.  Objective: Vital Signs: There were no vitals taken for this visit.  Physical Exam:   Constitutional: Patient appears well-developed HEENT:  Head: Normocephalic Eyes:EOM are normal Neck: Normal range of motion Cardiovascular: Normal rate Pulmonary/chest: Effort normal Neurologic: Patient is alert Skin: Skin is warm Psychiatric: Patient has normal mood and affect    Ortho Exam: Traits normal gait and alignment.  Palpable pedal pulses.  Good ankle dorsiflexion plantar flexion quad and hamstring strength.  No real tenderness over the symphysis.  No trochanteric tenderness is noted.  Mild pain on the left with internal and external rotation of the left leg.  Reflexes symmetric.  Negative Babinski negative clonus.  Specialty Comments:  No specialty comments available.  Imaging: No results found.   PMFS History: Patient Active Problem List   Diagnosis Date Noted  . Angina pectoris ([place]) 06/14/2012  . CAD (coronary artery disease), native coronary artery 06/14/2012  . S/P PTCA (percutaneous transluminal coronary angioplasty) 06/14/2012  . Hyperlipidemia 06/14/2012  . Essential hypertension 06/14/2012  . Pre-diabetes 06/14/2012   Past Medical History:  Diagnosis Date  . Anginal pain ([place])    " with exertion "  .  Arthritis    " 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 "  . Asthma    h/o  . Coronary artery disease   . GERD (gastroesophageal reflux disease)   . H/O hiatal hernia   . Heart murmur   . 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 Hypertension   . Persistent headaches    h/o  . Pneumonia    hx of PNA  . Shortness of breath   . Vaginal dryness    h/o    Family History  Problem Relation Age of Onset  . Breast cancer Other   . Diabetes Mother   . Glaucoma Mother   . Hypertension Mother   . Hypertension Father   . Asthma Father   . Diabetes Sister   . Diabetes Brother     Past Surgical History:  Procedure Laterality Date  . ABDOMINAL HYSTERECTOMY    . bone spur    . CARDIAC  CATHETERIZATION  06/13/2012  . carpel tunnel surgery    . COLONOSCOPY     9 years ago  . CORONARY ANGIOPLASTY  06/13/2012  . LEFT HEART CATHETERIZATION WITH CORONARY ANGIOGRAM N/A 06/13/2012   Procedure: LEFT HEART CATHETERIZATION WITH CORONARY ANGIOGRAM;  Surgeon: Laverda Page, MD;  Location: [facility] CATH LAB;  Service: Cardiovascular;  Laterality: N/A;   Social History   Occupational History  . Not on file  Tobacco Use  . Smoking status: Former Smoker    Last attempt to quit: 02/09/1999    Years since quitting: 17.9  . Smokeless tobacco: Never Used  Substance and Sexual Activity  . Alcohol use: No  . Drug use: No  . Sexual activity: Yes    Birth control/protection: Post-menopausal

## 2017-02-03 DIAGNOSIS — N6320 Unspecified lump in the left breast, unspecified quadrant: Secondary | ICD-10-CM | POA: Diagnosis not present

## 2017-02-03 DIAGNOSIS — N951 Menopausal and female climacteric states: Secondary | ICD-10-CM | POA: Diagnosis not present

## 2017-02-03 DIAGNOSIS — Z6835 Body mass index (BMI) 35.0-35.9, adult: Secondary | ICD-10-CM | POA: Diagnosis not present

## 2017-02-03 DIAGNOSIS — Z01419 Encounter for gynecological examination (general) (routine) without abnormal findings: Secondary | ICD-10-CM | POA: Diagnosis not present

## 2017-02-14 DIAGNOSIS — I10 Essential (primary) hypertension: Secondary | ICD-10-CM | POA: Diagnosis not present

## 2017-02-14 DIAGNOSIS — R05 Cough: Secondary | ICD-10-CM | POA: Diagnosis not present

## 2017-02-14 DIAGNOSIS — E559 Vitamin D deficiency, unspecified: Secondary | ICD-10-CM | POA: Diagnosis not present

## 2017-02-14 DIAGNOSIS — E1165 Type 2 diabetes mellitus with hyperglycemia: Secondary | ICD-10-CM | POA: Diagnosis not present

## 2017-02-17 DIAGNOSIS — R921 Mammographic calcification found on diagnostic imaging of breast: Secondary | ICD-10-CM | POA: Diagnosis not present

## 2017-02-18 DIAGNOSIS — E611 Iron deficiency: Secondary | ICD-10-CM | POA: Diagnosis not present

## 2017-02-18 DIAGNOSIS — Z0000 Encounter for general adult medical examination without abnormal findings: Secondary | ICD-10-CM | POA: Diagnosis not present

## 2017-02-18 DIAGNOSIS — I10 Essential (primary) hypertension: Secondary | ICD-10-CM | POA: Diagnosis not present

## 2017-02-18 DIAGNOSIS — E7800 Pure hypercholesterolemia, unspecified: Secondary | ICD-10-CM | POA: Diagnosis not present

## 2017-02-18 DIAGNOSIS — E1165 Type 2 diabetes mellitus with hyperglycemia: Secondary | ICD-10-CM | POA: Diagnosis not present

## 2017-03-25 ENCOUNTER — Emergency Department (HOSPITAL_COMMUNITY)
Admission: EM | Admit: 2017-03-25 | Discharge: 2017-03-25 | Disposition: A | Discharge status: Home / Self Care | Payer: Medicare Other | Attending: Emergency Medicine | Admitting: Emergency Medicine

## 2017-03-25 ENCOUNTER — Encounter (HOSPITAL_COMMUNITY): Payer: Self-pay | Admitting: Emergency Medicine

## 2017-03-25 DIAGNOSIS — M5410 Radiculopathy, site unspecified: Secondary | ICD-10-CM | POA: Insufficient documentation

## 2017-03-25 DIAGNOSIS — M542 Cervicalgia: Secondary | ICD-10-CM | POA: Diagnosis not present

## 2017-03-25 DIAGNOSIS — M79602 Pain in left arm: Secondary | ICD-10-CM | POA: Diagnosis not present

## 2017-03-25 DIAGNOSIS — I10 Essential (primary) hypertension: Secondary | ICD-10-CM | POA: Insufficient documentation

## 2017-03-25 DIAGNOSIS — J45909 Unspecified asthma, uncomplicated: Secondary | ICD-10-CM | POA: Insufficient documentation

## 2017-03-25 DIAGNOSIS — Z7982 Long term (current) use of aspirin: Secondary | ICD-10-CM | POA: Insufficient documentation

## 2017-03-25 DIAGNOSIS — Z87891 Personal history of nicotine dependence: Secondary | ICD-10-CM | POA: Diagnosis not present

## 2017-03-25 DIAGNOSIS — M5416 Radiculopathy, lumbar region: Secondary | ICD-10-CM | POA: Diagnosis not present

## 2017-03-25 DIAGNOSIS — Z8679 Personal history of other diseases of the circulatory system: Secondary | ICD-10-CM | POA: Diagnosis not present

## 2017-03-25 DIAGNOSIS — Z7984 Long term (current) use of oral hypoglycemic drugs: Secondary | ICD-10-CM | POA: Diagnosis not present

## 2017-03-25 DIAGNOSIS — I2510 Atherosclerotic heart disease of native coronary artery without angina pectoris: Secondary | ICD-10-CM | POA: Insufficient documentation

## 2017-03-25 DIAGNOSIS — Z79899 Other long term (current) drug therapy: Secondary | ICD-10-CM | POA: Insufficient documentation

## 2017-03-25 MED ORDER — METHOCARBAMOL 500 MG PO TABS: 500.0000 mg | ORAL_TABLET | Freq: Four times a day (QID) | ORAL | 0 refills | Status: DC | PRN

## 2017-03-25 MED ORDER — MELOXICAM 15 MG PO TABS: 15.0000 mg | ORAL_TABLET | Freq: Every day | ORAL | 0 refills | Status: DC

## 2017-03-25 NOTE — ED Provider Notes (Signed)
[facility] Provider Note   CSN: [id number] Arrival date & time: 03/25/17  1523     History   Chief Complaint Chief Complaint  Patient presents with  . Arm Pain    HPI Carolyn [name] is a 62 y.o. female.  Patient is a 62 year old female who presents to the emergency department with a complaint of left arm pain.  The patient states that she has been having problems with her neck for over a year.  She had surgery last year, had spurs removed and also had plates and screws placed.  She says that she had been having arm pain along with the neck pain.  It got some better, but from time to time it comes back.  Over the last 4-5 days it has been more intense.  2 days ago she had a very sharp pain in her arm.  It then decreased to a dull ache, and after it did not go away after 2 more days she came to the emergency department for evaluation.  She was also concerned because she had the arm pain about the same time she was having a great deal of fluctuation in her blood pressure.  She denies any loss of consciousness.  Is been no unusual sweats.  No vomiting reported, and no chest pain to be reported.  It is of note that she has coronary artery disease, but states she is not had problems with this recently.      Past Medical History:  Diagnosis Date  . Anginal pain ([place])    " with exertion "  . Arthritis    " 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 "  . Asthma    h/o  . Coronary artery disease   . GERD (gastroesophageal reflux disease)   . H/O hiatal hernia   . Heart murmur   . 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 Hypertension   . Persistent headaches    h/o  . Pneumonia    hx of PNA  . Shortness of breath   . Vaginal dryness    h/o    Patient Active Problem List   Diagnosis Date Noted  . Angina pectoris ([place]) 06/14/2012  . CAD (coronary artery disease), native coronary artery 06/14/2012  . S/P PTCA (percutaneous transluminal coronary angioplasty) 06/14/2012  . Hyperlipidemia 06/14/2012   . Essential hypertension 06/14/2012  . Pre-diabetes 06/14/2012    Past Surgical History:  Procedure Laterality Date  . ABDOMINAL HYSTERECTOMY    . bone spur    . CARDIAC CATHETERIZATION  06/13/2012  . carpel tunnel surgery    . COLONOSCOPY     9 years ago  . CORONARY ANGIOPLASTY  06/13/2012  . LEFT HEART CATHETERIZATION WITH CORONARY ANGIOGRAM N/A 06/13/2012   Procedure: LEFT HEART CATHETERIZATION WITH CORONARY ANGIOGRAM;  Surgeon: Laverda Page, MD;  Location: [facility] CATH LAB;  Service: Cardiovascular;  Laterality: N/A;  . NECK SURGERY      OB History    Gravida Para Term Preterm AB Living   3         3   SAB TAB Ectopic Multiple Live Births                   Home Medications    Prior to Admission medications   Medication Sig Start Date End Date Taking? Authorizing Provider  acetaminophen (TYLENOL) 500 MG tablet Take 1,000 mg by mouth every 6 (six) hours as needed for mild pain, fever or headache.  [provider]  albuterol (PROVENTIL HFA;VENTOLIN HFA) 108 (90 BASE) MCG/ACT inhaler Inhale 1-2 puffs into the lungs every 4 (four) hours as needed for wheezing or shortness of breath. 02/01/13   Margarita Mail, PA-C  amLODipine (NORVASC) 2.5 MG tablet Take 2.5 mg by mouth daily.    [provider]  aspirin EC 81 MG tablet Take 81 mg by mouth daily.    [provider]  atorvastatin (LIPITOR) 40 MG tablet Take 40 mg by mouth daily at 6 PM.    [provider]  cetirizine (ZYRTEC) 10 MG tablet Take 10 mg by mouth daily.    [provider]  cholecalciferol (VITAMIN D) 1000 UNITS tablet Take 1,000 Units by mouth daily.    [provider]  CHROMIUM PO Take by mouth.    [provider]  co-enzyme Q-10 30 MG capsule Take 30 mg by mouth 3 (three) times daily.    [provider]  cyclobenzaprine (FLEXERIL) 10 MG tablet Take 10 mg by mouth 3 (three) times daily as needed for muscle spasms.    [provider]    esomeprazole (NEXIUM) 10 MG packet Take 10 mg by mouth daily before breakfast.    [provider]  HYDROcodone-acetaminophen (NORCO/VICODIN) 5-325 MG tablet Take 1 tablet by mouth every 4 (four) hours as needed. 01/15/17   Janne Napoleon, NP  lansoprazole (PREVACID) 15 MG capsule Take 15 mg by mouth daily as needed (acid reflux and heartburn).     [provider]  meloxicam (MOBIC) 15 MG tablet 1 po q d x 3 weeks 01/26/17   Meredith Pel, MD  metFORMIN (GLUCOPHAGE) 500 MG tablet Take 500 mg by mouth at bedtime.    [provider]  methocarbamol (ROBAXIN) 500 MG tablet Take 1 tablet (500 mg total) by mouth 2 (two) times daily as needed for muscle spasms. 01/26/17   Meredith Pel, MD  metoprolol tartrate (LOPRESSOR) 25 MG tablet Take 25 mg by mouth 2 (two) times daily.    [provider]  nitroGLYCERIN (NITROSTAT) 0.4 MG SL tablet Place 1 tablet (0.4 mg total) under the tongue every 5 (five) minutes as needed for chest pain. 06/14/12   Adrian Prows, MD  Omega-3 Fatty Acids (OMEGA 3 PO) Take 1 tablet by mouth every evening. Omega Red    [provider]  Polyethyl Glycol-Propyl Glycol (SYSTANE) 0.4-0.3 % SOLN Apply 1 drop to eye daily.    [provider]  prasugrel (EFFIENT) 10 MG TABS Take 1 tablet (10 mg total) by mouth daily. 06/14/12   Adrian Prows, MD  psyllium (METAMUCIL) 58.6 % packet Take 1 packet by mouth at bedtime as needed.     [provider]  triamterene-hydrochlorothiazide (MAXZIDE-25) 37.5-25 MG per tablet Take 1 tablet by mouth daily.    [provider]    Family History Family History  Problem Relation Age of Onset  . Breast cancer Other   . Diabetes Mother   . Glaucoma Mother   . Hypertension Mother   . Hypertension Father   . Asthma Father   . Diabetes Sister   . Diabetes Brother     Social History Social History   Tobacco Use  . Smoking status: Former Smoker    Last attempt to quit: 02/09/1999     Years since quitting: 18.1  . Smokeless tobacco: Never Used  Substance Use Topics  . Alcohol use: No  . Drug use: No     Allergies  Patient has no known allergies.   Review of Systems Review of Systems  Constitutional: Negative for activity change.       All ROS Neg except as noted in HPI  HENT: Negative for nosebleeds.   Eyes: Negative for photophobia and discharge.  Respiratory: Negative for cough, shortness of breath and wheezing.   Cardiovascular: Negative for chest pain and palpitations.  Gastrointestinal: Negative for abdominal pain and blood in stool.  Genitourinary: Negative for dysuria, frequency and hematuria.  Musculoskeletal: Positive for arthralgias and neck pain. Negative for back pain.       Arm pain  Skin: Negative.   Neurological: Negative for dizziness, seizures and speech difficulty.  Psychiatric/Behavioral: Negative for confusion and hallucinations.     Physical Exam Updated Vital Signs BP (!) 152/72 (BP Location: Right Arm)   Pulse 65   Temp 98.4 F (36.9 C) (Oral)   Resp 18   SpO2 100%   Physical Exam  Constitutional: She is oriented to person, place, and time. She appears well-developed and well-nourished.  Non-toxic appearance.  HENT:  Head: Normocephalic.  Right Ear: Tympanic membrane and external ear normal.  Left Ear: Tympanic membrane and external ear normal.  Eyes: EOM and lids are normal. Pupils are equal, round, and reactive to light.  Neck: Normal range of motion. Neck supple. Carotid bruit is not present.  Cardiovascular: Normal rate, regular rhythm, normal heart sounds, intact distal pulses and normal pulses. Exam reveals no gallop and no friction rub.  Pulmonary/Chest: Breath sounds normal. No stridor. No respiratory distress. She has no wheezes.  Abdominal: Soft. Bowel sounds are normal. There is no tenderness. There is no guarding.  Musculoskeletal:  There is mild to moderate pain of the cervical spine.  There is some upper  trapezius tightness and tenseness on the left.  There is no deformity or pain to palpation of the left shoulder, bicep tricep area, elbow, wrist, or fingers.  There are no temperature changes of the left upper extremity.  Radial pulses are 2+ bilaterally, capillary refill is less than 2 seconds.  Lymphadenopathy:       Head (right side): No submandibular adenopathy present.       Head (left side): No submandibular adenopathy present.    She has no cervical adenopathy.  Neurological: She is alert and oriented to person, place, and time. She has normal strength. No cranial nerve deficit or sensory deficit.  Gait is steady.  Coordination is steady, and balance is steady.  Skin: Skin is warm and dry.  Psychiatric: She has a normal mood and affect. Her speech is normal.  Nursing note and vitals reviewed.    ED Treatments / Results  Labs (all labs ordered are listed, but only abnormal results are displayed) Labs Reviewed - No data to display  EKG  EKG Interpretation None       Radiology No results found.  Procedures Procedures (including critical care time)  Medications Ordered in ED Medications - No data to display   Initial Impression / Assessment and Plan / ED Course  I have reviewed the triage vital signs and the nursing notes.  Pertinent labs & imaging results that were available during my care of the patient were reviewed by me and considered in my medical decision making (see chart for details).       Final Clinical Impressions(s) / ED Diagnoses  Blood pressure is elevated at 152/72, otherwise vital signs within normal limits.  Patient denies chest pain, loss of consciousness, unusual shortness  of breath, unusual sweats, nausea or vomiting.  Electrocardiogram shows a sinus bradycardia present with some nonspecific T wave changes.  When compared to previous EKG, this actually looks improved.  Doubt cardiac source of arm pain.  No gross neurologic or vascular deficit  appreciated.  Patient has had neck surgery approximately a year to 1-1/2 years ago.  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  I have ordered the patient to use meloxicam and Robaxin.  I have also asked the patient to use the use heating pad to the area.  She is to call her orthopedic specialist and set up an appointment soon.  The patient will return to the emergency department if any changes, problems, or concerns.  Patient is in agreement with this plan.   Final diagnoses:  None    ED Discharge Orders    None       Lily Kocher, PA-C 03/25/17 1641    Milton Ferguson, MD 03/27/17 1041

## 2017-03-25 NOTE — Discharge Instructions (Signed)
Your vital signs are within normal limits, with the exception of your blood pressure being 152/72.  There are no neurologic deficits or vascular deficits appreciated on your examination at this time.  Your electrocardiogram shows a normal sinus rhythm, and no evidence of any acute cardiac changes.  I suspect that your arm pain is related to the cervical degenerative disc disease, with radiculopathy.  Please consult your orthopedic specialist for recheck as soon as possible.  Heating pad to the area may be helpful.  Use mobic daily. Use robaxin for spasm pain.This medication may cause drowsiness. Please do not drink, drive, or participate in activity that requires concentration while taking this medication.

## 2017-03-25 NOTE — ED Triage Notes (Addendum)
Patient complaining of left arm pain off and on x 4-5 days. Denies injury. Denies pain at triage.

## 2017-03-29 ENCOUNTER — Telehealth (INDEPENDENT_AMBULATORY_CARE_PROVIDER_SITE_OTHER): Payer: Self-pay | Admitting: Orthopedic Surgery

## 2017-03-29 NOTE — Telephone Encounter (Signed)
Maria from [employer] called to follow up on a fax in regards to the shoulder brace for the patient.  CB#[phone].  Thank you.

## 2017-03-30 DIAGNOSIS — Z981 Arthrodesis status: Secondary | ICD-10-CM | POA: Diagnosis not present

## 2017-03-30 DIAGNOSIS — Z4789 Encounter for other orthopedic aftercare: Secondary | ICD-10-CM | POA: Diagnosis not present

## 2017-03-30 NOTE — Telephone Encounter (Signed)
I called. We have not seen patient recently for shoulder. Last OV was for her hip

## 2017-05-23 DIAGNOSIS — L988 Other specified disorders of the skin and subcutaneous tissue: Secondary | ICD-10-CM | POA: Diagnosis not present

## 2017-05-23 DIAGNOSIS — N644 Mastodynia: Secondary | ICD-10-CM | POA: Diagnosis not present

## 2017-06-29 DIAGNOSIS — D485 Neoplasm of uncertain behavior of skin: Secondary | ICD-10-CM | POA: Diagnosis not present

## 2017-07-15 DIAGNOSIS — E049 Nontoxic goiter, unspecified: Secondary | ICD-10-CM | POA: Diagnosis not present

## 2017-07-15 DIAGNOSIS — R5383 Other fatigue: Secondary | ICD-10-CM | POA: Diagnosis not present

## 2017-07-19 ENCOUNTER — Other Ambulatory Visit: Payer: Self-pay | Admitting: Internal Medicine

## 2017-07-19 DIAGNOSIS — E010 Iodine-deficiency related diffuse (endemic) goiter: Secondary | ICD-10-CM

## 2017-07-20 DIAGNOSIS — M542 Cervicalgia: Secondary | ICD-10-CM | POA: Diagnosis not present

## 2017-07-25 ENCOUNTER — Ambulatory Visit
Admission: RE | Admit: 2017-07-25 | Discharge: 2017-07-25 | Disposition: A | Discharge status: Home / Self Care | Payer: Medicare Other | Source: Ambulatory Visit | Attending: Internal Medicine | Admitting: Internal Medicine

## 2017-07-25 DIAGNOSIS — E010 Iodine-deficiency related diffuse (endemic) goiter: Secondary | ICD-10-CM

## 2017-07-25 DIAGNOSIS — E049 Nontoxic goiter, unspecified: Secondary | ICD-10-CM | POA: Diagnosis not present

## 2017-08-02 ENCOUNTER — Other Ambulatory Visit: Payer: Self-pay

## 2017-08-02 DIAGNOSIS — L720 Epidermal cyst: Secondary | ICD-10-CM | POA: Diagnosis not present

## 2017-08-02 DIAGNOSIS — D485 Neoplasm of uncertain behavior of skin: Secondary | ICD-10-CM | POA: Diagnosis not present

## 2017-08-19 DIAGNOSIS — E1165 Type 2 diabetes mellitus with hyperglycemia: Secondary | ICD-10-CM | POA: Diagnosis not present

## 2017-08-19 DIAGNOSIS — Z0000 Encounter for general adult medical examination without abnormal findings: Secondary | ICD-10-CM | POA: Diagnosis not present

## 2017-08-19 DIAGNOSIS — I10 Essential (primary) hypertension: Secondary | ICD-10-CM | POA: Diagnosis not present

## 2017-08-19 DIAGNOSIS — E611 Iron deficiency: Secondary | ICD-10-CM | POA: Diagnosis not present

## 2017-08-26 DIAGNOSIS — M25551 Pain in right hip: Secondary | ICD-10-CM | POA: Diagnosis not present

## 2017-08-26 DIAGNOSIS — Z780 Asymptomatic menopausal state: Secondary | ICD-10-CM | POA: Diagnosis not present

## 2017-08-26 DIAGNOSIS — I10 Essential (primary) hypertension: Secondary | ICD-10-CM | POA: Diagnosis not present

## 2017-08-26 DIAGNOSIS — Z0000 Encounter for general adult medical examination without abnormal findings: Secondary | ICD-10-CM | POA: Diagnosis not present

## 2017-08-26 DIAGNOSIS — M5431 Sciatica, right side: Secondary | ICD-10-CM | POA: Diagnosis not present

## 2017-08-26 DIAGNOSIS — M5432 Sciatica, left side: Secondary | ICD-10-CM | POA: Diagnosis not present

## 2017-08-26 DIAGNOSIS — E1165 Type 2 diabetes mellitus with hyperglycemia: Secondary | ICD-10-CM | POA: Diagnosis not present

## 2017-08-26 DIAGNOSIS — E7800 Pure hypercholesterolemia, unspecified: Secondary | ICD-10-CM | POA: Diagnosis not present

## 2017-08-30 DIAGNOSIS — M2550 Pain in unspecified joint: Secondary | ICD-10-CM | POA: Diagnosis not present

## 2017-08-30 DIAGNOSIS — M1990 Unspecified osteoarthritis, unspecified site: Secondary | ICD-10-CM | POA: Diagnosis not present

## 2017-08-30 DIAGNOSIS — M25551 Pain in right hip: Secondary | ICD-10-CM | POA: Diagnosis not present

## 2017-08-30 DIAGNOSIS — M549 Dorsalgia, unspecified: Secondary | ICD-10-CM | POA: Diagnosis not present

## 2017-08-30 DIAGNOSIS — M160 Bilateral primary osteoarthritis of hip: Secondary | ICD-10-CM | POA: Diagnosis not present

## 2017-08-30 DIAGNOSIS — M545 Low back pain: Secondary | ICD-10-CM | POA: Diagnosis not present

## 2017-09-30 DIAGNOSIS — M2550 Pain in unspecified joint: Secondary | ICD-10-CM | POA: Diagnosis not present

## 2017-09-30 DIAGNOSIS — R5383 Other fatigue: Secondary | ICD-10-CM | POA: Diagnosis not present

## 2017-09-30 DIAGNOSIS — D501 Sideropenic dysphagia: Secondary | ICD-10-CM | POA: Diagnosis not present

## 2017-10-03 DIAGNOSIS — M2550 Pain in unspecified joint: Secondary | ICD-10-CM | POA: Diagnosis not present

## 2017-10-03 DIAGNOSIS — N390 Urinary tract infection, site not specified: Secondary | ICD-10-CM | POA: Diagnosis not present

## 2017-10-03 DIAGNOSIS — I10 Essential (primary) hypertension: Secondary | ICD-10-CM | POA: Diagnosis not present

## 2017-10-03 DIAGNOSIS — E785 Hyperlipidemia, unspecified: Secondary | ICD-10-CM | POA: Diagnosis not present

## 2017-10-03 DIAGNOSIS — R5383 Other fatigue: Secondary | ICD-10-CM | POA: Diagnosis not present

## 2017-10-03 DIAGNOSIS — D501 Sideropenic dysphagia: Secondary | ICD-10-CM | POA: Diagnosis not present

## 2017-10-04 DIAGNOSIS — K5900 Constipation, unspecified: Secondary | ICD-10-CM | POA: Diagnosis not present

## 2017-10-04 DIAGNOSIS — K625 Hemorrhage of anus and rectum: Secondary | ICD-10-CM | POA: Diagnosis not present

## 2017-10-04 DIAGNOSIS — K5730 Diverticulosis of large intestine without perforation or abscess without bleeding: Secondary | ICD-10-CM | POA: Diagnosis not present

## 2017-10-07 DIAGNOSIS — Z23 Encounter for immunization: Secondary | ICD-10-CM | POA: Diagnosis not present

## 2017-10-07 DIAGNOSIS — R5383 Other fatigue: Secondary | ICD-10-CM | POA: Diagnosis not present

## 2017-10-07 DIAGNOSIS — R700 Elevated erythrocyte sedimentation rate: Secondary | ICD-10-CM | POA: Diagnosis not present

## 2017-10-07 DIAGNOSIS — E611 Iron deficiency: Secondary | ICD-10-CM | POA: Diagnosis not present

## 2017-10-20 DIAGNOSIS — R700 Elevated erythrocyte sedimentation rate: Secondary | ICD-10-CM | POA: Diagnosis not present

## 2017-10-20 DIAGNOSIS — M549 Dorsalgia, unspecified: Secondary | ICD-10-CM | POA: Diagnosis not present

## 2017-10-20 DIAGNOSIS — M25559 Pain in unspecified hip: Secondary | ICD-10-CM | POA: Diagnosis not present

## 2017-10-20 DIAGNOSIS — M2550 Pain in unspecified joint: Secondary | ICD-10-CM | POA: Diagnosis not present

## 2017-10-20 DIAGNOSIS — M1990 Unspecified osteoarthritis, unspecified site: Secondary | ICD-10-CM | POA: Diagnosis not present

## 2017-11-24 DIAGNOSIS — M25559 Pain in unspecified hip: Secondary | ICD-10-CM | POA: Diagnosis not present

## 2017-11-24 DIAGNOSIS — M1990 Unspecified osteoarthritis, unspecified site: Secondary | ICD-10-CM | POA: Diagnosis not present

## 2017-11-24 DIAGNOSIS — M2550 Pain in unspecified joint: Secondary | ICD-10-CM | POA: Diagnosis not present

## 2017-11-24 DIAGNOSIS — R700 Elevated erythrocyte sedimentation rate: Secondary | ICD-10-CM | POA: Diagnosis not present

## 2017-11-24 DIAGNOSIS — D8989 Other specified disorders involving the immune mechanism, not elsewhere classified: Secondary | ICD-10-CM | POA: Diagnosis not present

## 2017-11-24 DIAGNOSIS — M549 Dorsalgia, unspecified: Secondary | ICD-10-CM | POA: Diagnosis not present

## 2017-12-07 DIAGNOSIS — E118 Type 2 diabetes mellitus with unspecified complications: Secondary | ICD-10-CM | POA: Diagnosis not present

## 2017-12-07 DIAGNOSIS — I2510 Atherosclerotic heart disease of native coronary artery without angina pectoris: Secondary | ICD-10-CM | POA: Diagnosis not present

## 2017-12-07 DIAGNOSIS — I10 Essential (primary) hypertension: Secondary | ICD-10-CM | POA: Diagnosis not present

## 2017-12-19 DIAGNOSIS — E119 Type 2 diabetes mellitus without complications: Secondary | ICD-10-CM | POA: Diagnosis not present

## 2018-01-18 DIAGNOSIS — M50123 Cervical disc disorder at C6-C7 level with radiculopathy: Secondary | ICD-10-CM | POA: Diagnosis not present

## 2018-01-18 DIAGNOSIS — M25512 Pain in left shoulder: Secondary | ICD-10-CM | POA: Diagnosis not present

## 2018-01-18 DIAGNOSIS — E119 Type 2 diabetes mellitus without complications: Secondary | ICD-10-CM | POA: Diagnosis not present

## 2018-01-18 DIAGNOSIS — Z981 Arthrodesis status: Secondary | ICD-10-CM | POA: Diagnosis not present

## 2018-01-18 DIAGNOSIS — I10 Essential (primary) hypertension: Secondary | ICD-10-CM | POA: Diagnosis not present

## 2018-01-18 DIAGNOSIS — I2510 Atherosclerotic heart disease of native coronary artery without angina pectoris: Secondary | ICD-10-CM | POA: Diagnosis not present

## 2018-01-18 DIAGNOSIS — M50122 Cervical disc disorder at C5-C6 level with radiculopathy: Secondary | ICD-10-CM | POA: Diagnosis not present

## 2018-01-18 DIAGNOSIS — Z9861 Coronary angioplasty status: Secondary | ICD-10-CM | POA: Diagnosis not present

## 2018-01-18 DIAGNOSIS — M542 Cervicalgia: Secondary | ICD-10-CM | POA: Diagnosis not present

## 2018-01-18 DIAGNOSIS — M5011 Cervical disc disorder with radiculopathy,  high cervical region: Secondary | ICD-10-CM | POA: Diagnosis not present

## 2018-01-26 DIAGNOSIS — M549 Dorsalgia, unspecified: Secondary | ICD-10-CM | POA: Diagnosis not present

## 2018-01-26 DIAGNOSIS — Z13820 Encounter for screening for osteoporosis: Secondary | ICD-10-CM | POA: Diagnosis not present

## 2018-01-26 DIAGNOSIS — R768 Other specified abnormal immunological findings in serum: Secondary | ICD-10-CM | POA: Diagnosis not present

## 2018-01-26 DIAGNOSIS — M2550 Pain in unspecified joint: Secondary | ICD-10-CM | POA: Diagnosis not present

## 2018-01-26 DIAGNOSIS — M25551 Pain in right hip: Secondary | ICD-10-CM | POA: Diagnosis not present

## 2018-01-26 DIAGNOSIS — M1990 Unspecified osteoarthritis, unspecified site: Secondary | ICD-10-CM | POA: Diagnosis not present

## 2018-02-06 DIAGNOSIS — Z01419 Encounter for gynecological examination (general) (routine) without abnormal findings: Secondary | ICD-10-CM | POA: Diagnosis not present

## 2018-02-06 DIAGNOSIS — Z6834 Body mass index (BMI) 34.0-34.9, adult: Secondary | ICD-10-CM | POA: Diagnosis not present

## 2018-02-06 DIAGNOSIS — N951 Menopausal and female climacteric states: Secondary | ICD-10-CM | POA: Diagnosis not present

## 2018-02-20 DIAGNOSIS — I10 Essential (primary) hypertension: Secondary | ICD-10-CM | POA: Diagnosis not present

## 2018-02-20 DIAGNOSIS — E1165 Type 2 diabetes mellitus with hyperglycemia: Secondary | ICD-10-CM | POA: Diagnosis not present

## 2018-02-22 DIAGNOSIS — Z0189 Encounter for other specified special examinations: Secondary | ICD-10-CM | POA: Diagnosis not present

## 2018-02-22 DIAGNOSIS — Z9861 Coronary angioplasty status: Secondary | ICD-10-CM | POA: Diagnosis not present

## 2018-02-22 DIAGNOSIS — I10 Essential (primary) hypertension: Secondary | ICD-10-CM | POA: Diagnosis not present

## 2018-02-22 DIAGNOSIS — I2510 Atherosclerotic heart disease of native coronary artery without angina pectoris: Secondary | ICD-10-CM | POA: Diagnosis not present

## 2018-02-27 DIAGNOSIS — I2510 Atherosclerotic heart disease of native coronary artery without angina pectoris: Secondary | ICD-10-CM | POA: Diagnosis not present

## 2018-02-27 DIAGNOSIS — E611 Iron deficiency: Secondary | ICD-10-CM | POA: Diagnosis not present

## 2018-02-27 DIAGNOSIS — E785 Hyperlipidemia, unspecified: Secondary | ICD-10-CM | POA: Diagnosis not present

## 2018-02-27 DIAGNOSIS — F5101 Primary insomnia: Secondary | ICD-10-CM | POA: Diagnosis not present

## 2018-02-27 DIAGNOSIS — D649 Anemia, unspecified: Secondary | ICD-10-CM | POA: Diagnosis not present

## 2018-02-27 DIAGNOSIS — I10 Essential (primary) hypertension: Secondary | ICD-10-CM | POA: Diagnosis not present

## 2018-02-27 DIAGNOSIS — E1165 Type 2 diabetes mellitus with hyperglycemia: Secondary | ICD-10-CM | POA: Diagnosis not present

## 2018-03-13 DIAGNOSIS — F5101 Primary insomnia: Secondary | ICD-10-CM | POA: Diagnosis not present

## 2018-03-13 DIAGNOSIS — L02416 Cutaneous abscess of left lower limb: Secondary | ICD-10-CM | POA: Diagnosis not present

## 2018-03-17 DIAGNOSIS — Z1231 Encounter for screening mammogram for malignant neoplasm of breast: Secondary | ICD-10-CM | POA: Diagnosis not present

## 2018-06-01 DIAGNOSIS — M2550 Pain in unspecified joint: Secondary | ICD-10-CM | POA: Diagnosis not present

## 2018-06-01 DIAGNOSIS — M549 Dorsalgia, unspecified: Secondary | ICD-10-CM | POA: Diagnosis not present

## 2018-06-01 DIAGNOSIS — R700 Elevated erythrocyte sedimentation rate: Secondary | ICD-10-CM | POA: Diagnosis not present

## 2018-06-01 DIAGNOSIS — Z13820 Encounter for screening for osteoporosis: Secondary | ICD-10-CM | POA: Diagnosis not present

## 2018-06-01 DIAGNOSIS — R768 Other specified abnormal immunological findings in serum: Secondary | ICD-10-CM | POA: Diagnosis not present

## 2018-06-01 DIAGNOSIS — M1990 Unspecified osteoarthritis, unspecified site: Secondary | ICD-10-CM | POA: Diagnosis not present

## 2018-07-14 DIAGNOSIS — M9905 Segmental and somatic dysfunction of pelvic region: Secondary | ICD-10-CM | POA: Diagnosis not present

## 2018-07-14 DIAGNOSIS — M5442 Lumbago with sciatica, left side: Secondary | ICD-10-CM | POA: Diagnosis not present

## 2018-07-14 DIAGNOSIS — M9903 Segmental and somatic dysfunction of lumbar region: Secondary | ICD-10-CM | POA: Diagnosis not present

## 2018-07-14 DIAGNOSIS — M9902 Segmental and somatic dysfunction of thoracic region: Secondary | ICD-10-CM | POA: Diagnosis not present

## 2018-07-17 DIAGNOSIS — M9903 Segmental and somatic dysfunction of lumbar region: Secondary | ICD-10-CM | POA: Diagnosis not present

## 2018-07-17 DIAGNOSIS — M9902 Segmental and somatic dysfunction of thoracic region: Secondary | ICD-10-CM | POA: Diagnosis not present

## 2018-07-17 DIAGNOSIS — M5442 Lumbago with sciatica, left side: Secondary | ICD-10-CM | POA: Diagnosis not present

## 2018-07-17 DIAGNOSIS — M9905 Segmental and somatic dysfunction of pelvic region: Secondary | ICD-10-CM | POA: Diagnosis not present

## 2018-07-19 DIAGNOSIS — M9903 Segmental and somatic dysfunction of lumbar region: Secondary | ICD-10-CM | POA: Diagnosis not present

## 2018-07-19 DIAGNOSIS — M9902 Segmental and somatic dysfunction of thoracic region: Secondary | ICD-10-CM | POA: Diagnosis not present

## 2018-07-19 DIAGNOSIS — M9905 Segmental and somatic dysfunction of pelvic region: Secondary | ICD-10-CM | POA: Diagnosis not present

## 2018-07-19 DIAGNOSIS — M5442 Lumbago with sciatica, left side: Secondary | ICD-10-CM | POA: Diagnosis not present

## 2018-07-24 DIAGNOSIS — M9903 Segmental and somatic dysfunction of lumbar region: Secondary | ICD-10-CM | POA: Diagnosis not present

## 2018-07-24 DIAGNOSIS — M9902 Segmental and somatic dysfunction of thoracic region: Secondary | ICD-10-CM | POA: Diagnosis not present

## 2018-07-24 DIAGNOSIS — M5442 Lumbago with sciatica, left side: Secondary | ICD-10-CM | POA: Diagnosis not present

## 2018-07-24 DIAGNOSIS — M9905 Segmental and somatic dysfunction of pelvic region: Secondary | ICD-10-CM | POA: Diagnosis not present

## 2018-07-26 DIAGNOSIS — M9903 Segmental and somatic dysfunction of lumbar region: Secondary | ICD-10-CM | POA: Diagnosis not present

## 2018-07-26 DIAGNOSIS — M9905 Segmental and somatic dysfunction of pelvic region: Secondary | ICD-10-CM | POA: Diagnosis not present

## 2018-07-26 DIAGNOSIS — M9902 Segmental and somatic dysfunction of thoracic region: Secondary | ICD-10-CM | POA: Diagnosis not present

## 2018-07-26 DIAGNOSIS — M5442 Lumbago with sciatica, left side: Secondary | ICD-10-CM | POA: Diagnosis not present

## 2018-07-31 DIAGNOSIS — M9903 Segmental and somatic dysfunction of lumbar region: Secondary | ICD-10-CM | POA: Diagnosis not present

## 2018-07-31 DIAGNOSIS — M9905 Segmental and somatic dysfunction of pelvic region: Secondary | ICD-10-CM | POA: Diagnosis not present

## 2018-07-31 DIAGNOSIS — M5442 Lumbago with sciatica, left side: Secondary | ICD-10-CM | POA: Diagnosis not present

## 2018-07-31 DIAGNOSIS — M9902 Segmental and somatic dysfunction of thoracic region: Secondary | ICD-10-CM | POA: Diagnosis not present

## 2018-08-07 ENCOUNTER — Emergency Department (HOSPITAL_COMMUNITY): Payer: Medicare Other

## 2018-08-07 ENCOUNTER — Encounter (HOSPITAL_COMMUNITY): Payer: Self-pay | Admitting: Emergency Medicine

## 2018-08-07 ENCOUNTER — Other Ambulatory Visit: Payer: Self-pay

## 2018-08-07 ENCOUNTER — Emergency Department (HOSPITAL_COMMUNITY)
Admission: EM | Admit: 2018-08-07 | Discharge: 2018-08-07 | Disposition: A | Discharge status: Home / Self Care | Payer: Medicare Other | Attending: Emergency Medicine | Admitting: Emergency Medicine

## 2018-08-07 DIAGNOSIS — R079 Chest pain, unspecified: Secondary | ICD-10-CM | POA: Diagnosis not present

## 2018-08-07 DIAGNOSIS — M9905 Segmental and somatic dysfunction of pelvic region: Secondary | ICD-10-CM | POA: Diagnosis not present

## 2018-08-07 DIAGNOSIS — Z79891 Long term (current) use of opiate analgesic: Secondary | ICD-10-CM | POA: Insufficient documentation

## 2018-08-07 DIAGNOSIS — I2510 Atherosclerotic heart disease of native coronary artery without angina pectoris: Secondary | ICD-10-CM | POA: Insufficient documentation

## 2018-08-07 DIAGNOSIS — R911 Solitary pulmonary nodule: Secondary | ICD-10-CM | POA: Diagnosis not present

## 2018-08-07 DIAGNOSIS — I10 Essential (primary) hypertension: Secondary | ICD-10-CM | POA: Diagnosis not present

## 2018-08-07 DIAGNOSIS — R0789 Other chest pain: Secondary | ICD-10-CM | POA: Insufficient documentation

## 2018-08-07 DIAGNOSIS — M5442 Lumbago with sciatica, left side: Secondary | ICD-10-CM | POA: Diagnosis not present

## 2018-08-07 DIAGNOSIS — J45909 Unspecified asthma, uncomplicated: Secondary | ICD-10-CM | POA: Diagnosis not present

## 2018-08-07 DIAGNOSIS — Z87891 Personal history of nicotine dependence: Secondary | ICD-10-CM | POA: Diagnosis not present

## 2018-08-07 DIAGNOSIS — C801 Malignant (primary) neoplasm, unspecified: Secondary | ICD-10-CM | POA: Diagnosis not present

## 2018-08-07 DIAGNOSIS — C771 Secondary and unspecified malignant neoplasm of intrathoracic lymph nodes: Secondary | ICD-10-CM | POA: Diagnosis not present

## 2018-08-07 DIAGNOSIS — Z9861 Coronary angioplasty status: Secondary | ICD-10-CM | POA: Diagnosis not present

## 2018-08-07 DIAGNOSIS — M9902 Segmental and somatic dysfunction of thoracic region: Secondary | ICD-10-CM | POA: Diagnosis not present

## 2018-08-07 DIAGNOSIS — M25512 Pain in left shoulder: Secondary | ICD-10-CM | POA: Diagnosis not present

## 2018-08-07 DIAGNOSIS — M9903 Segmental and somatic dysfunction of lumbar region: Secondary | ICD-10-CM | POA: Diagnosis not present

## 2018-08-07 DIAGNOSIS — R918 Other nonspecific abnormal finding of lung field: Secondary | ICD-10-CM | POA: Diagnosis not present

## 2018-08-07 DIAGNOSIS — M9907 Segmental and somatic dysfunction of upper extremity: Secondary | ICD-10-CM | POA: Diagnosis not present

## 2018-08-07 DIAGNOSIS — Z79899 Other long term (current) drug therapy: Secondary | ICD-10-CM | POA: Insufficient documentation

## 2018-08-07 LAB — BASIC METABOLIC PANEL
Anion gap: 12 (ref 5–15)
BUN: 13 mg/dL (ref 8–23)
CO2: 25 mmol/L (ref 22–32)
Calcium: 9.1 mg/dL (ref 8.9–10.3)
Chloride: 103 mmol/L (ref 98–111)
Creatinine, Ser: 0.91 mg/dL (ref 0.44–1.00)
GFR calc Af Amer: >60 mL/min (ref >60)
GFR calc non Af Amer: >60 mL/min (ref >60)
Glucose, Bld: 140 mg/dL — ABNORMAL HIGH (ref 70–99)
Potassium: 3.6 mmol/L (ref 3.5–5.1)
Sodium: 140 mmol/L (ref 135–145)

## 2018-08-07 LAB — TROPONIN I (HIGH SENSITIVITY)
Troponin I (High Sensitivity): <2 ng/L (ref <18)
Troponin I (High Sensitivity): 2 ng/L (ref <18)

## 2018-08-07 LAB — CBC
HCT: 37.6 % (ref 36.0–46.0)
Hemoglobin: 10.9 g/dL — ABNORMAL LOW (ref 12.0–15.0)
MCH: 22.7 pg — ABNORMAL LOW (ref 26.0–34.0)
MCHC: 29 g/dL — ABNORMAL LOW (ref 30.0–36.0)
MCV: 78.3 fL — ABNORMAL LOW (ref 80.0–100.0)
Platelets: 236 10*3/uL (ref 150–400)
RBC: 4.8 MIL/uL (ref 3.87–5.11)
RDW: 16.3 % — ABNORMAL HIGH (ref 11.5–15.5)
WBC: 5.9 10*3/uL (ref 4.0–10.5)
nRBC: 0 % (ref 0.0–0.2)

## 2018-08-07 MED ORDER — IOHEXOL 300 MG/ML  SOLN
75.0000 mL | Freq: Once | INTRAMUSCULAR | Status: AC | PRN
  Administered 2018-08-07: 75 mL via INTRAVENOUS

## 2018-08-07 MED ORDER — ASPIRIN 81 MG PO CHEW
162.0000 mg | CHEWABLE_TABLET | Freq: Once | ORAL | Status: AC
  Administered 2018-08-07: 162 mg via ORAL
  Filled 2018-08-07: qty 2

## 2018-08-07 NOTE — ED Provider Notes (Signed)
[facility] EMERGENCY DEPARTMENT Provider Note   CSN: [id number] Arrival date & time: 08/07/18  1404    History   Chief Complaint Chief Complaint  Patient presents with  . Chest Pain    HPI Carolyn [name] is a 63 y.o. female with a PMH of CAD s/p stent in 2014, HTN, Asthma, and Headaches presenting with intermittent central non radiating chest pain onset 2 days ago. Patient describes pain as a discomfort. Patient states nothing makes symptoms better or worse. Patient denies nausea, vomiting, abdominal pain, or shortness of breath. Patient denies using OCPs, recent travel, recent surgery, leg edema/pain, or hx of DVT/PE. Patient denies fever, chills, cough, congestion, recent travel, or sick exposures. Patient denies recent tobacco, alcohol, or drug use.      HPI  Past Medical History:  Diagnosis Date  . Anginal pain ([place])    " with exertion "  . Arthritis    " 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 "  . Asthma    h/o  . Coronary artery disease   . GERD (gastroesophageal reflux disease)   . H/O hiatal hernia   . Heart murmur   . 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 Hypertension   . Persistent headaches    h/o  . Pneumonia    hx of PNA  . Shortness of breath   . Vaginal dryness    h/o    Patient Active Problem List   Diagnosis Date Noted  . Angina pectoris ([place]) 06/14/2012  . CAD (coronary artery disease), native coronary artery 06/14/2012  . S/P PTCA (percutaneous transluminal coronary angioplasty) 06/14/2012  . Hyperlipidemia 06/14/2012  . Essential hypertension 06/14/2012  . Pre-diabetes 06/14/2012    Past Surgical History:  Procedure Laterality Date  . ABDOMINAL HYSTERECTOMY    . bone spur    . CARDIAC CATHETERIZATION  06/13/2012  . carpel tunnel surgery    . COLONOSCOPY     9 years ago  . CORONARY ANGIOPLASTY  06/13/2012  . LEFT HEART CATHETERIZATION WITH CORONARY ANGIOGRAM N/A 06/13/2012   Procedure: LEFT HEART CATHETERIZATION WITH CORONARY ANGIOGRAM;  Surgeon: Laverda Page, MD;   [facility] CATH LAB;  Service: Cardiovascular;  Laterality: N/A;  . NECK SURGERY       OB History    Gravida  3   Para      Term      Preterm      AB      Living  3     SAB      TAB      Ectopic      Multiple      Live Births               Home Medications    Prior to Admission medications   Medication Sig Start Date End Date Taking? Authorizing Provider  acetaminophen (TYLENOL) 500 MG tablet Take 1,000 mg by mouth every 6 (six) hours as needed for mild pain, fever or headache.    [provider]  albuterol (PROVENTIL HFA;VENTOLIN HFA) 108 (90 BASE) MCG/ACT inhaler Inhale 1-2 puffs into the lungs every 4 (four) hours as needed for wheezing or shortness of breath. 02/01/13   Margarita Mail, PA-C  amLODipine (NORVASC) 2.5 MG tablet Take 2.5 mg by mouth daily.    [provider]  aspirin EC 81 MG tablet Take 81 mg by mouth daily.    [provider]  atorvastatin (LIPITOR) 40 MG tablet Take 40 mg by mouth daily  at 6 PM.    [provider]  cetirizine (ZYRTEC) 10 MG tablet Take 10 mg by mouth daily.    [provider]  cholecalciferol (VITAMIN D) 1000 UNITS tablet Take 1,000 Units by mouth daily.    [provider]  CHROMIUM PO Take by mouth.    [provider]  co-enzyme Q-10 30 MG capsule Take 30 mg by mouth 3 (three) times daily.    [provider]  cyclobenzaprine (FLEXERIL) 10 MG tablet Take 10 mg by mouth 3 (three) times daily as needed for muscle spasms.    [provider]  esomeprazole (NEXIUM) 10 MG packet Take 10 mg by mouth daily before breakfast.    [provider]  HYDROcodone-acetaminophen (NORCO/VICODIN) 5-325 MG tablet Take 1 tablet by mouth every 4 (four) hours as needed. 01/15/17   Janne Napoleon, NP  lansoprazole (PREVACID) 15 MG capsule Take 15 mg by mouth daily as needed (acid reflux and heartburn).     [provider]  meloxicam (MOBIC) 15 MG tablet  Take 1 tablet (15 mg total) by mouth daily. 03/25/17   Lily Kocher, PA-C  metFORMIN (GLUCOPHAGE) 500 MG tablet Take 500 mg by mouth at bedtime.    [provider]  methocarbamol (ROBAXIN) 500 MG tablet Take 1 tablet (500 mg total) by mouth every 6 (six) hours as needed for muscle spasms. 03/25/17   Lily Kocher, PA-C  metoprolol tartrate (LOPRESSOR) 25 MG tablet Take 25 mg by mouth 2 (two) times daily.    [provider]  nitroGLYCERIN (NITROSTAT) 0.4 MG SL tablet Place 1 tablet (0.4 mg total) under the tongue every 5 (five) minutes as needed for chest pain. 06/14/12   Adrian Prows, MD  Omega-3 Fatty Acids (OMEGA 3 PO) Take 1 tablet by mouth every evening. Omega Red    [provider]  Polyethyl Glycol-Propyl Glycol (SYSTANE) 0.4-0.3 % SOLN Apply 1 drop to eye daily.    [provider]  prasugrel (EFFIENT) 10 MG TABS Take 1 tablet (10 mg total) by mouth daily. 06/14/12   Adrian Prows, MD  psyllium (METAMUCIL) 58.6 % packet Take 1 packet by mouth at bedtime as needed.     [provider]  triamterene-hydrochlorothiazide (MAXZIDE-25) 37.5-25 MG per tablet Take 1 tablet by mouth daily.    [provider]    Family History Family History  Problem Relation Age of Onset  . Breast cancer Other   . Diabetes Mother   . Glaucoma Mother   . Hypertension Mother   . Hypertension Father   . Asthma Father   . Diabetes Sister   . Diabetes Brother     Social History Social History   Tobacco Use  . Smoking status: Former Smoker    Quit date: 02/09/1999    Years since quitting: 19.5  . Smokeless tobacco: Never Used  Substance Use Topics  . Alcohol use: No  . Drug use: No     Allergies   Patient has no known allergies.   Review of Systems Review of Systems  Constitutional: Negative for activity change, appetite change, chills, diaphoresis, fatigue, fever and unexpected weight change.  HENT: Negative for congestion and rhinorrhea.    Respiratory: Negative for cough, chest tightness, shortness of breath and wheezing.   Cardiovascular: Positive for chest pain. Negative for palpitations and leg swelling.  Gastrointestinal: Negative for abdominal pain, nausea and vomiting.  Endocrine: Negative for cold intolerance and heat intolerance.  Musculoskeletal: Negative for back pain.  Skin:  Negative for rash.  Allergic/Immunologic: Negative for immunocompromised state.  Neurological: Negative for dizziness, syncope, weakness and light-headedness.  Psychiatric/Behavioral: Negative for agitation and behavioral problems. The patient is not nervous/anxious.      Physical Exam Updated Vital Signs BP 136/66 (BP Location: Left Arm)   Pulse 64   Temp 97.7 F (36.5 C) (Oral)   Resp 16   SpO2 100%   Physical Exam Vitals signs and nursing note reviewed.  Constitutional:      General: She is not in acute distress.    Appearance: She is well-developed. She is not diaphoretic.     Comments: Patient is smiling and laughing throughout exam.   HENT:     Head: Normocephalic and atraumatic.  Neck:     Musculoskeletal: Normal range of motion and neck supple.     Vascular: No JVD.  Cardiovascular:     Rate and Rhythm: Normal rate and regular rhythm.     Pulses: Normal pulses.          Radial pulses are 2+ on the right side and 2+ on the left side.       Dorsalis pedis pulses are 2+ on the right side and 2+ on the left side.     Heart sounds: Normal heart sounds. No murmur. No friction rub. No gallop.   Pulmonary:     Effort: Pulmonary effort is normal. No respiratory distress.     Breath sounds: Normal breath sounds. No wheezing, rhonchi or rales.  Chest:     Chest wall: Tenderness (Central chest tenderness to palpation.) present.  Abdominal:     Palpations: Abdomen is soft.     Tenderness: There is no abdominal tenderness.  Musculoskeletal: Normal range of motion.     Right lower leg: She exhibits no tenderness. No edema.      Left lower leg: She exhibits no tenderness. No edema.  Skin:    Capillary Refill: Capillary refill takes less than 2 seconds.     Coloration: Skin is not pale.     Findings: No rash.  Neurological:     Mental Status: She is alert.     ED Treatments / Results  Labs (all labs ordered are listed, but only abnormal results are displayed) Labs Reviewed  BASIC METABOLIC PANEL - Abnormal; Notable for the following components:      Result Value   Glucose, Bld 140 (*)    All other components within normal limits  CBC - Abnormal; Notable for the following components:   Hemoglobin 10.9 (*)    MCV 78.3 (*)    MCH 22.7 (*)    MCHC 29.0 (*)    RDW 16.3 (*)    All other components within normal limits  TROPONIN I (HIGH SENSITIVITY)  TROPONIN I (HIGH SENSITIVITY)    EKG EKG Interpretation  Date/Time:  Monday August 07 2018 14:39:20 EDT Ventricular Rate:  62 PR Interval:    QRS Duration: 93 QT Interval:  402 QTC Calculation: 409 R Axis:   56 Text Interpretation:  Sinus rhythm Nonspecific T abnormalities, diffuse leads no significant change since 2019 Confirmed by Sherwood Gambler [phone]) on 08/07/2018 2:42:42 PM   Radiology Ct Chest W Contrast  Result Date: 08/07/2018 CLINICAL DATA:  63 year old female with chest pain and possible spiculated left upper lung nodule on earlier portable chest. EXAM: CT CHEST WITH CONTRAST TECHNIQUE: Multidetector CT imaging of the chest was performed during intravenous contrast administration. CONTRAST:  92mL OMNIPAQUE IOHEXOL 300 MG/ML  SOLN COMPARISON:  Portable chest earlier today. Chest radiographs 02/01/2013. FINDINGS: Cardiovascular: Calcified aortic atherosclerosis. Calcified coronary artery atherosclerosis and/or stents. No cardiomegaly or pericardial effusion. Mediastinum/Nodes: Prevascular mediastinal lymphadenopathy is heterogeneous and about 18 millimeters short axis. There is similar low-density left hilar lymphadenopathy, up to 15 millimeter short  axis. Smaller 8 millimeter contralateral right hilar node. No precarinal or other mediastinal lymphadenopathy. Possible small hiatal hernia. Lungs/Pleura: Spiculated left upper lobe lung mass measuring 23 x 28 millimeters (including the spiculations as seen on coronal image 66). Mild regional architectural distortion. In the contralateral right upper lobe there is a smaller subpleural 9 millimeter lung nodule on series 4, image 41. Major airways are patent. No pleural effusion or other abnormal pulmonary opacity. Upper Abdomen: Negative visible liver, gallbladder, spleen, pancreas, adrenal glands, kidneys, and intra-abdominal stomach. Diverticulosis in the distal transverse colon. Musculoskeletal: Partially visible cervical ACDF. No acute or suspicious osseous lesion identified. IMPRESSION: 1. 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 Malignant appearing left hilar and prevascular mediastinal lymphadenopathy. 2. Smaller, indeterminate contralateral subpleural right upper lobe 9 mm lung nodule. 3. Recommend referral to [facility]). Electronically Signed   By: Genevie Ann M.D.   On: 08/07/2018 17:29   Dg Chest Port 1 View  Result Date: 08/07/2018 CLINICAL DATA:  Chest pain EXAM: PORTABLE CHEST 1 VIEW COMPARISON:  None. FINDINGS: The heart size and mediastinal contours are within normal limits. There is a spiculated appearing nodule of the left upper lobe measuring approximately 2.6 cm in projection. The visualized skeletal structures are unremarkable. IMPRESSION: 1.  No acute appearing airspace opacity. 2. There is a spiculated appearing nodule of the left upper lobe measuring approximately 2.6 cm in projection, concerning for malignancy. Recommend CT to further evaluate. Electronically Signed   By: Eddie Candle M.D.   On: 08/07/2018 14:55    Procedures Procedures (including critical care time)  Medications Ordered in  ED Medications  aspirin chewable tablet 162 mg (162 mg Oral Given 08/07/18 1448)  iohexol (OMNIPAQUE) 300 MG/ML solution 75 mL (75 mLs Intravenous Contrast Given 08/07/18 1706)     Initial Impression / Assessment and Plan / ED Course  I have reviewed the triage vital signs and the nursing notes.  Pertinent labs & imaging results that were available during my care of the patient were reviewed by me and considered in my medical decision making (see chart for details).  Clinical Course as of Aug 06 1752  Mon Aug 07, 2018  1535 No acute appearing airspace opacity. There is a spiculated appearing nodule of the left upper lobe measuring approximately 2.6 cm in projection, concerning for malignancy. Recommend CT to further evaluate.    DG Chest Port 1 View [AH]  1734 1. 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 Malignant appearing left hilar and prevascular mediastinal lymphadenopathy. 2. Smaller, indeterminate contralateral subpleural right upper lobe 9 mm lung nodule. 3. Recommend referral to [facility]).    CT Chest W Contrast [AH]    Clinical Course User Index [AH] Arville Lime, PA-C      Patient presents with chest pain. Chest pain is not likely of cardiac d/t presentation, VSS, no tracheal deviation, no JVD or new murmur, RRR, breath sounds equal bilaterally, EKG without acute abnormalities, high sensitivity troponin <4 x 2, pain started >3 hours ago, patient has been asymptomatic while in the ER, and Heart score is 2. CXR  reveals left upper lobe nodule and CT of chest was recommended by radiology. CT chest reveals left upper lobe lung mass about 65mm concerning for bronchogenic carcinoma and right upper lobe reveals a 83mm lung nodule. Discussed findings in detail with patient and discussed the importance of following up with oncology. Provided oncology referral to patient. Pt has been  advised to return to the ED if CP becomes exertional, associated with diaphoresis or nausea, radiates to left jaw/arm, worsens or becomes concerning in any way. Patient is to be discharged with recommendation to follow up with PCP in regards to today's hospital visit.  Pt appears reliable for follow up and is agreeable to discharge.   Case has been discussed with and seen by Dr. Eulis Foster who agrees with the above plan to discharge.   Final Clinical Impressions(s) / ED Diagnoses   Final diagnoses:  Nonspecific chest pain  Lung mass    ED Discharge Orders    None       Arville Lime, [place] 08/07/18 1756    Daleen Bo, MD 08/08/18 [phone]

## 2018-08-07 NOTE — ED Notes (Signed)
Pt updated and notified provider will talk with her shortly.

## 2018-08-07 NOTE — Discharge Instructions (Addendum)
You have been seen today for chest pain. Please read and follow all provided instructions.   1. Medications: usual home medications 2. Treatment: rest, drink plenty of fluids 3. Follow Up: Please follow up with your primary doctor in 2 days for discussion of your diagnoses and further evaluation after today's visit; if you do not have a primary care doctor use the resource guide provided to find one; Please return to the ER for any new or worsening symptoms. Please obtain all of your results from medical records or have your doctors office obtain the results - share them with your doctor - you should be seen at your doctors office. Call today to arrange your follow up.   Take medications as prescribed. Please review all of the medicines and only take them if you do not have an allergy to them. Return to the emergency room for worsening condition or new concerning symptoms. Follow up with your regular doctor. If you don't have a regular doctor use one of the numbers below to establish a primary care doctor.  Please be aware that if you are taking birth control pills, taking other prescriptions, ESPECIALLY ANTIBIOTICS may make the birth control ineffective - if this is the case, either do not engage in sexual activity or use alternative methods of birth control such as condoms until you have finished the medicine and your family doctor says it is OK to restart them. If you are on a blood thinner such as COUMADIN, be aware that any other medicine that you take may cause the coumadin to either work too much, or not enough - you should have your coumadin level rechecked in next 7 days if this is the case.  ?  It is also a possibility that you have an allergic reaction to any of the medicines that you have been prescribed - Everybody reacts differently to medications and while MOST people have no trouble with most medicines, you may have a reaction such as nausea, vomiting, rash, swelling, shortness of breath.  If this is the case, please stop taking the medicine immediately and contact your physician.  ?  You should return to the ER if you develop severe or worsening symptoms.   Emergency Department Resource Guide 1) Find a Doctor and Pay Out of Pocket Although you won't have to find out who is covered by your insurance plan, it is a good idea to ask around and get recommendations. You will then need to call the office and see if the doctor you have chosen will accept you as a new patient and what types of options they offer for patients who are self-pay. Some doctors offer discounts or will set up payment plans for their patients who do not have insurance, but you will need to ask so you aren't surprised when you get to your appointment.  2) Contact Your Local Health Department Not all health departments have doctors that can see patients for sick visits, but many do, so it is worth a call to see if yours does. If you don't know where your local health department is, you can check in your phone book. The CDC also has a tool to help you locate your state's health department, and many state websites also have listings of all of their local health departments.  3) Find a [facility] If your illness is not likely to be very severe or complicated, you may want to try a walk in clinic. These are popping up all over  the country in pharmacies, drugstores, and shopping centers. They're usually staffed by nurse practitioners or physician assistants that have been trained to treat common illnesses and complaints. They're usually fairly quick and inexpensive. However, if you have serious medical issues or chronic medical problems, these are probably not your best option.  No Primary Care Doctor: Call Health Connect at  [phone] - they can help you locate a primary care doctor that  accepts your insurance, provides certain services, etc. Physician Referral Service- [phone]  Chronic Pain  Problems: Organization         Address  Phone   Notes  [facility]  [phone] Patients need to be referred by their primary care doctor.   Medication Assistance: Organization         Address  Phone   Notes  [facility] [address] [place], [place] [address] [phone] --Must be a resident of [facility] -- Must have NO insurance coverage whatsoever (no Medicaid/ Medicare, etc.) -- The pt. MUST have a primary care doctor that directs their care regularly and follows them in the community   [facility]  [phone]   [employer]  [phone]    Agencies that provide inexpensive medical care: Organization         Address  Phone   Notes  [place]  [phone]   [facility]    [phone]   [facility] [place], [place] [address] [phone]   [place] [address], [place] [phone]   [facility]    [phone]   [facility]    [phone]   [place] and [place] [address], [place] Phone:  [phone], Fax:  [phone] Hours of Operation:  9 am - 6 pm, M-F.  Also accepts Medicaid/Medicare and self-pay.  [facility] for [address], [address], [place] Phone: [phone], Fax: [phone]. Hours of Operation:  8:30 am - 5:30 pm, M-F.  Also accepts Medicaid and self-pay.  [facility] [address] [place] Phone: [phone]   [place], [place], [place] [phone], Ext. [phone] Mondays & Thursdays: 7-9 AM.  First 15 patients are seen on a first come, first serve basis.    [place] Providers:  Organization         Address  Phone   Notes  [facility] [address], [place] [phone] Also  accepts self-pay patients.  [facility] [address], [place]  [phone]   [place], [address], [place] [phone]   [facility] [address], [place] [phone]   Lucianne Lei [address], [place]   [phone] Only accepts [place] patients after they have their name applied to their card.   Self-Pay (no insurance) in [facility]:  Organization         Address  Phone   Notes  Sickle Cell Patients, [facility] [place] [phone]   [facility] [place] [phone]   [facility] [place]  [place], [address], [place] [phone]   [facility]/Dr. Osei-Bonsu  [address], [place] or [address], [place] [phone] Phone number for both [place] and [place] locations is the same.  Urgent Medical and [facility] [address] [place] [phone]   [facility] [address] [place] or [address] [phone]   [facility] [address] [place] [phone], phone; [phone], fax Sees patients 1st and 3rd Saturday of every month.  Must not qualify for public or private insurance (i.e. Medicaid, Medicare, [place] Health Choice, Veterans' Benefits)  Household income should be no more than 200% of the poverty level The clinic cannot treat you if you are pregnant or think you are pregnant  Sexually transmitted diseases are not treated at the clinic.

## 2018-08-07 NOTE — ED Triage Notes (Signed)
Pt c/o of having central chest pain x 2 days

## 2018-08-07 NOTE — ED Provider Notes (Signed)
  Face-to-face evaluation   History: She presents for evaluation of chest pain, for 2 days.  Pain comes and goes and feels like "gas."  She takes PPI for reflux.  No prior cardiac or pulmonary history.  She is a long ago ex-smoker.  No chronic lung disease.  Physical exam:, Overweight female.  No dysarthria or aphasia.  No respiratory distress.  She moves arms and legs equally.  She stands and walks normally.  Note that I examined her after she attempted to sit on a rolling stool and it rolled, causing her to land on her buttocks.  She was able ambulate after that easily and has no complaints of back, pelvic or lumbar pain, currently.  Medical screening examination/treatment/procedure(s) were conducted as a shared visit with non-physician practitioner(s) and myself.  I personally evaluated the patient during the encounter    EKG Interpretation  Date/Time:  Monday August 07 2018 14:39:20 EDT Ventricular Rate:  62 PR Interval:    QRS Duration: 93 QT Interval:  402 QTC Calculation: 409 R Axis:   56 Text Interpretation:  Sinus rhythm Nonspecific T abnormalities, diffuse leads no significant change since 2019 Confirmed by Sherwood Gambler [phone]) on 08/07/2018 2:42:42 PM         Daleen Bo, MD 08/08/18 [phone]

## 2018-08-08 ENCOUNTER — Telehealth: Payer: Self-pay | Admitting: *Deleted

## 2018-08-08 DIAGNOSIS — R918 Other nonspecific abnormal finding of lung field: Secondary | ICD-10-CM

## 2018-08-08 NOTE — Telephone Encounter (Signed)
Oncology Nurse Navigator Documentation  Oncology Nurse Navigator Flowsheets 08/08/2018  Navigator Location [facility]  Referral Date to RadOnc/MedOnc 08/08/2018  Navigator Encounter Type Telephone/per Dr. Julien Nordmann, he received referral on [name].  He would like me to schedule her to be seen.  I called and schedule her to be seen next week.  She verbalized understanding of appt time and place.  Telephone Outgoing Call  Abnormal Finding Date 08/07/2018  Treatment Phase Abnormal Scans  Barriers/Navigation Needs Coordination of Care;Education  Education Other  Interventions Coordination of Care;Education  Coordination of Care Appts  Education Method Verbal  Acuity Level 2  Time Spent with Patient 30

## 2018-08-09 DIAGNOSIS — C7931 Secondary malignant neoplasm of brain: Secondary | ICD-10-CM

## 2018-08-09 HISTORY — DX: Secondary malignant neoplasm of brain: C79.31

## 2018-08-14 DIAGNOSIS — M5442 Lumbago with sciatica, left side: Secondary | ICD-10-CM | POA: Diagnosis not present

## 2018-08-14 DIAGNOSIS — M9902 Segmental and somatic dysfunction of thoracic region: Secondary | ICD-10-CM | POA: Diagnosis not present

## 2018-08-14 DIAGNOSIS — M9905 Segmental and somatic dysfunction of pelvic region: Secondary | ICD-10-CM | POA: Diagnosis not present

## 2018-08-14 DIAGNOSIS — M9907 Segmental and somatic dysfunction of upper extremity: Secondary | ICD-10-CM | POA: Diagnosis not present

## 2018-08-14 DIAGNOSIS — M25512 Pain in left shoulder: Secondary | ICD-10-CM | POA: Diagnosis not present

## 2018-08-14 DIAGNOSIS — M9903 Segmental and somatic dysfunction of lumbar region: Secondary | ICD-10-CM | POA: Diagnosis not present

## 2018-08-17 ENCOUNTER — Inpatient Hospital Stay: Payer: Medicare Other | Attending: Internal Medicine | Admitting: Internal Medicine

## 2018-08-17 ENCOUNTER — Other Ambulatory Visit: Payer: Self-pay | Admitting: *Deleted

## 2018-08-17 ENCOUNTER — Inpatient Hospital Stay: Payer: Medicare Other

## 2018-08-17 ENCOUNTER — Other Ambulatory Visit: Payer: Self-pay

## 2018-08-17 ENCOUNTER — Encounter: Payer: Self-pay | Admitting: Internal Medicine

## 2018-08-17 VITALS — BP 148/57 | HR 64 | Temp 98.7°F | Resp 20 | Ht 61.0 in | Wt 176.5 lb

## 2018-08-17 DIAGNOSIS — E119 Type 2 diabetes mellitus without complications: Secondary | ICD-10-CM | POA: Diagnosis not present

## 2018-08-17 DIAGNOSIS — I10 Essential (primary) hypertension: Secondary | ICD-10-CM | POA: Diagnosis not present

## 2018-08-17 DIAGNOSIS — M1990 Unspecified osteoarthritis, unspecified site: Secondary | ICD-10-CM | POA: Diagnosis not present

## 2018-08-17 DIAGNOSIS — Z87891 Personal history of nicotine dependence: Secondary | ICD-10-CM | POA: Diagnosis not present

## 2018-08-17 DIAGNOSIS — I2510 Atherosclerotic heart disease of native coronary artery without angina pectoris: Secondary | ICD-10-CM

## 2018-08-17 DIAGNOSIS — C7931 Secondary malignant neoplasm of brain: Secondary | ICD-10-CM | POA: Insufficient documentation

## 2018-08-17 DIAGNOSIS — K219 Gastro-esophageal reflux disease without esophagitis: Secondary | ICD-10-CM | POA: Diagnosis not present

## 2018-08-17 DIAGNOSIS — C801 Malignant (primary) neoplasm, unspecified: Secondary | ICD-10-CM | POA: Insufficient documentation

## 2018-08-17 DIAGNOSIS — R918 Other nonspecific abnormal finding of lung field: Secondary | ICD-10-CM | POA: Diagnosis not present

## 2018-08-17 LAB — CBC WITH DIFFERENTIAL (CANCER CENTER ONLY)
Abs Immature Granulocytes: 0.01 10*3/uL (ref 0.00–0.07)
Basophils Absolute: 0 10*3/uL (ref 0.0–0.1)
Basophils Relative: 1 %
Eosinophils Absolute: 0.2 10*3/uL (ref 0.0–0.5)
Eosinophils Relative: 3 %
HCT: 37.6 % (ref 36.0–46.0)
Hemoglobin: 11.3 g/dL — ABNORMAL LOW (ref 12.0–15.0)
Immature Granulocytes: 0 %
Lymphocytes Relative: 24 %
Lymphs Abs: 1.5 10*3/uL (ref 0.7–4.0)
MCH: 22.9 pg — ABNORMAL LOW (ref 26.0–34.0)
MCHC: 30.1 g/dL (ref 30.0–36.0)
MCV: 76.1 fL — ABNORMAL LOW (ref 80.0–100.0)
Monocytes Absolute: 0.6 10*3/uL (ref 0.1–1.0)
Monocytes Relative: 9 %
Neutro Abs: 4 10*3/uL (ref 1.7–7.7)
Neutrophils Relative %: 63 %
Platelet Count: 350 10*3/uL (ref 150–400)
RBC: 4.94 MIL/uL (ref 3.87–5.11)
RDW: 16.4 % — ABNORMAL HIGH (ref 11.5–15.5)
WBC Count: 6.4 10*3/uL (ref 4.0–10.5)
nRBC: 0 % (ref 0.0–0.2)

## 2018-08-17 LAB — CMP (CANCER CENTER ONLY)
ALT: 13 U/L (ref 0–44)
AST: 22 U/L (ref 15–41)
Albumin: 3.7 g/dL (ref 3.5–5.0)
Alkaline Phosphatase: 82 U/L (ref 38–126)
Anion gap: 9 (ref 5–15)
BUN: 6 mg/dL — ABNORMAL LOW (ref 8–23)
CO2: 28 mmol/L (ref 22–32)
Calcium: 9.4 mg/dL (ref 8.9–10.3)
Chloride: 104 mmol/L (ref 98–111)
Creatinine: 1 mg/dL (ref 0.44–1.00)
GFR, Est AFR Am: >60 mL/min (ref >60)
GFR, Estimated: 59 mL/min — ABNORMAL LOW (ref >60)
Glucose, Bld: 98 mg/dL (ref 70–99)
Potassium: 4.1 mmol/L (ref 3.5–5.1)
Sodium: 141 mmol/L (ref 135–145)
Total Bilirubin: 0.3 mg/dL (ref 0.3–1.2)
Total Protein: 8 g/dL (ref 6.5–8.1)

## 2018-08-17 NOTE — Progress Notes (Signed)
The proposed treatment discussion in cancer conference 08/17/2018 is for discussion purpose only and is not a binding recommendation.  The patient was not physically examined nor present for their treatment options.  Therefore, final treatment plans cannot be decided.

## 2018-08-17 NOTE — Progress Notes (Signed)
[place] [phone]   [phone] Multidisciplinary thoracic oncology clinic  CONSULT NOTE  REFERRING PHYSICIAN: Dr. [name]  REASON FOR CONSULTATION:  63 years old African-American female with questionable lung cancer.  HPI Carolyn [name] is a 63 y.o. female with past medical history significant for hypertension, GERD, coronary artery disease status post stent placement, history of asthma many years ago, HPV, diabetes mellitus, dyslipidemia as well as osteoarthritis and history of smoking but quit 18 years ago.  The patient presented to the emergency department on 08/07/2018 complaining of chest pain.  Because of her cardiac history she underwent cardiac work-up that was unremarkable.  Chest x-ray performed on 08/07/2018 showed spiculated appearing nodule of the left upper lobe measuring approximately 2.6 cm concerning for malignancy.  This was followed by CT scan of the chest with contrast on the same day and that showed spiculated left upper lobe lung mass measuring 2.8 cm compatible with bronchogenic carcinoma.  There was also malignant appearing left hilar and prevascular mediastinal lymphadenopathy.  There was a smaller indeterminate contralateral subpleural right upper lobe 0.9 cm lung nodule. The patient was referred to the multidisciplinary thoracic oncology clinic today for further evaluation and recommendation regarding this abnormality in her scan. When seen today she is feeling fine with no concerning complaints except for shortness of breath with exertion.  She exercises at regular basis.  She denied having any chest pain, cough or hemoptysis.  She has no significant weight loss or night sweats.  She has no nausea, vomiting, diarrhea or constipation.  She denied having any headache or visual changes. Family history significant for mother with heart disease, brother had stroke and father had an unknown medical history. The patient is married  and has 3 children 2 daughters and 1 son.  She used to work as a Building control surveyor.  She has a history for smoking around 0.5 pack/day for 30 years and quit 18 years ago.  She has no history of alcohol or drug abuse.  HPI  Past Medical History:  Diagnosis Date  . Anginal pain ([place])    " with exertion "  . Arthritis    " 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 "  . Asthma    h/o  . Coronary artery disease   . GERD (gastroesophageal reflux disease)   . H/O hiatal hernia   . Heart murmur   . 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 Hypertension   . Persistent headaches    h/o  . Pneumonia    hx of PNA  . Shortness of breath   . Vaginal dryness    h/o    Past Surgical History:  Procedure Laterality Date  . ABDOMINAL HYSTERECTOMY    . bone spur    . CARDIAC CATHETERIZATION  06/13/2012  . carpel tunnel surgery    . COLONOSCOPY     9 years ago  . CORONARY ANGIOPLASTY  06/13/2012  . LEFT HEART CATHETERIZATION WITH CORONARY ANGIOGRAM N/A 06/13/2012   Procedure: LEFT HEART CATHETERIZATION WITH CORONARY ANGIOGRAM;  Surgeon: Laverda Page, MD;  Location: [facility] CATH LAB;  Service: Cardiovascular;  Laterality: N/A;  . NECK SURGERY      Family History  Problem Relation Age of Onset  . Breast cancer Other   . Diabetes Mother   . Glaucoma Mother   . Hypertension Mother   . Hypertension Father   . Asthma Father   . Diabetes Sister   . Diabetes  Brother     Social History Social History   Tobacco Use  . Smoking status: Former Smoker    Quit date: 02/09/1999    Years since quitting: 19.5  . Smokeless tobacco: Never Used  Substance Use Topics  . Alcohol use: No  . Drug use: No    Allergies  Allergen Reactions  . Other     Current Outpatient Medications  Medication Sig Dispense Refill  . acetaminophen (TYLENOL) 500 MG tablet Take 1,000 mg by mouth every 6 (six) hours as needed for mild pain, fever or headache.    . albuterol (PROVENTIL HFA;VENTOLIN HFA) 108 (90 BASE) MCG/ACT inhaler Inhale 1-2  puffs into the lungs every 4 (four) hours as needed for wheezing or shortness of breath.    Marland Kitchen amLODipine (NORVASC) 2.5 MG tablet Take 2.5 mg by mouth daily.    Marland Kitchen aspirin EC 81 MG tablet Take 81 mg by mouth daily.    Marland Kitchen atorvastatin (LIPITOR) 40 MG tablet Take 40 mg by mouth daily at 6 PM.    . cetirizine (ZYRTEC) 10 MG tablet Take 10 mg by mouth daily.    . cholecalciferol (VITAMIN D) 1000 UNITS tablet Take 1,000 Units by mouth daily.    . CHROMIUM PO Take by mouth.    . co-enzyme Q-10 30 MG capsule Take 30 mg by mouth 3 (three) times daily.    . cyclobenzaprine (FLEXERIL) 10 MG tablet Take 10 mg by mouth 3 (three) times daily as needed for muscle spasms.    Marland Kitchen esomeprazole (NEXIUM) 10 MG packet Take 10 mg by mouth daily before breakfast.    . HYDROcodone-acetaminophen (NORCO/VICODIN) 5-325 MG tablet Take 1 tablet by mouth every 4 (four) hours as needed. 15 tablet 0  . lansoprazole (PREVACID) 15 MG capsule Take 15 mg by mouth daily as needed (acid reflux and heartburn).     . meloxicam (MOBIC) 15 MG tablet Take 1 tablet (15 mg total) by mouth daily. 6 tablet 0  . metFORMIN (GLUCOPHAGE) 500 MG tablet Take 500 mg by mouth at bedtime.    . methocarbamol (ROBAXIN) 500 MG tablet Take 1 tablet (500 mg total) by mouth every 6 (six) hours as needed for muscle spasms. 21 tablet 0  . metoprolol tartrate (LOPRESSOR) 25 MG tablet Take 25 mg by mouth 2 (two) times daily.    . nitroGLYCERIN (NITROSTAT) 0.4 MG SL tablet Place 1 tablet (0.4 mg total) under the tongue every 5 (five) minutes as needed for chest pain. 30 tablet 12  . Omega-3 Fatty Acids (OMEGA 3 PO) Take 1 tablet by mouth every evening. Omega Red    . Polyethyl Glycol-Propyl Glycol (SYSTANE) 0.4-0.3 % SOLN Apply 1 drop to eye daily.    . prasugrel (EFFIENT) 10 MG TABS Take 1 tablet (10 mg total) by mouth daily. 30 tablet 0  . psyllium (METAMUCIL) 58.6 % packet Take 1 packet by mouth at bedtime as needed.     . triamterene-hydrochlorothiazide  (MAXZIDE-25) 37.5-25 MG per tablet Take 1 tablet by mouth daily.     No current facility-administered medications for this visit.     Review of Systems  Constitutional: negative Eyes: negative Ears, nose, mouth, throat, and face: negative Respiratory: positive for dyspnea on exertion Cardiovascular: negative Gastrointestinal: negative Genitourinary:negative Integument/breast: negative Hematologic/lymphatic: negative Musculoskeletal:negative Neurological: negative Behavioral/Psych: negative Endocrine: negative Allergic/Immunologic: negative  Physical Exam  GYJ:EHUDJ, healthy, no distress, well nourished and well developed SKIN: skin color, texture, turgor are normal, no rashes or significant lesions HEAD:  Normocephalic, No masses, lesions, tenderness or abnormalities EYES: normal, PERRLA, Conjunctiva are pink and non-injected EARS: External ears normal, Canals clear OROPHARYNX:no exudate, no erythema and lips, buccal mucosa, and tongue normal  NECK: supple, no adenopathy, no JVD LYMPH:  no palpable lymphadenopathy, no hepatosplenomegaly BREAST:not examined LUNGS: clear to auscultation , and palpation HEART: regular rate & rhythm, no murmurs and no gallops ABDOMEN:abdomen soft, non-tender, normal bowel sounds and no masses or organomegaly BACK: No CVA tenderness, Range of motion is normal EXTREMITIES:no joint deformities, effusion, or inflammation, no edema  NEURO: alert & oriented x 3 with fluent speech, no focal motor/sensory deficits  PERFORMANCE STATUS: ECOG 1  LABORATORY DATA: Lab Results  Component Value Date   WBC 6.4 08/17/2018   HGB 11.3 (L) 08/17/2018   HCT 37.6 08/17/2018   MCV 76.1 (L) 08/17/2018   PLT 350 08/17/2018      Chemistry      Component Value Date/Time   NA 140 08/07/2018 1510   K 3.6 08/07/2018 1510   CL 103 08/07/2018 1510   CO2 25 08/07/2018 1510   BUN 13 08/07/2018 1510   CREATININE 0.91 08/07/2018 1510      Component Value  Date/Time   CALCIUM 9.1 08/07/2018 1510   ALKPHOS 81 06/14/2013 1930   AST 31 06/14/2013 1930   ALT 21 06/14/2013 1930   BILITOT 0.3 06/14/2013 1930       RADIOGRAPHIC STUDIES: Ct Chest W Contrast  Result Date: 08/07/2018 CLINICAL DATA:  63 year old female with chest pain and possible spiculated left upper lung nodule on earlier portable chest. EXAM: CT CHEST WITH CONTRAST TECHNIQUE: Multidetector CT imaging of the chest was performed during intravenous contrast administration. CONTRAST:  48mL OMNIPAQUE IOHEXOL 300 MG/ML  SOLN COMPARISON:  Portable chest earlier today. Chest radiographs 02/01/2013. FINDINGS: Cardiovascular: Calcified aortic atherosclerosis. Calcified coronary artery atherosclerosis and/or stents. No cardiomegaly or pericardial effusion. Mediastinum/Nodes: Prevascular mediastinal lymphadenopathy is heterogeneous and about 18 millimeters short axis. There is similar low-density left hilar lymphadenopathy, up to 15 millimeter short axis. Smaller 8 millimeter contralateral right hilar node. No precarinal or other mediastinal lymphadenopathy. Possible small hiatal hernia. Lungs/Pleura: Spiculated left upper lobe lung mass measuring 23 x 28 millimeters (including the spiculations as seen on coronal image 66). Mild regional architectural distortion. In the contralateral right upper lobe there is a smaller subpleural 9 millimeter lung nodule on series 4, image 41. Major airways are patent. No pleural effusion or other abnormal pulmonary opacity. Upper Abdomen: Negative visible liver, gallbladder, spleen, pancreas, adrenal glands, kidneys, and intra-abdominal stomach. Diverticulosis in the distal transverse colon. Musculoskeletal: Partially visible cervical ACDF. No acute or suspicious osseous lesion identified. IMPRESSION: 1. 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 Malignant appearing left hilar and prevascular mediastinal  lymphadenopathy. 2. Smaller, indeterminate contralateral subpleural right upper lobe 9 mm lung nodule. 3. Recommend referral to [facility]). Electronically Signed   By: Genevie Ann M.D.   On: 08/07/2018 17:29   Dg Chest Port 1 View  Result Date: 08/07/2018 CLINICAL DATA:  Chest pain EXAM: PORTABLE CHEST 1 VIEW COMPARISON:  None. FINDINGS: The heart size and mediastinal contours are within normal limits. There is a spiculated appearing nodule of the left upper lobe measuring approximately 2.6 cm in projection. The visualized skeletal structures are unremarkable. IMPRESSION: 1.  No acute appearing airspace opacity. 2. There is a spiculated appearing nodule of the left upper lobe measuring approximately 2.6 cm in projection, concerning  for malignancy. Recommend CT to further evaluate. Electronically Signed   By: Eddie Candle M.D.   On: 08/07/2018 14:55    ASSESSMENT: This is a very pleasant 63 years old African-American female with highly suspicious stage IIIa/B non-small cell lung cancer pending further staging work-up and tissue diagnosis.  She presented with left upper lobe as well as left hilar and mediastinal lymphadenopathy.   PLAN: I had a lengthy discussion with the patient today about her current disease condition and further investigation to confirm her diagnosis. I personally and independently reviewed the scan images and discussed the result and showed the images to the patient today. I recommended for the patient to have a PET scan as well as MRI of the brain to complete the staging work-up. Once the PET scan is done, will refer the patient to Dr. Roxan Hockey for bronchoscopy and endobronchial ultrasound and biopsy. I will arrange for the patient to come back for follow-up visit in 2 weeks for reevaluation and discussion of her treatment options based on the biopsy and staging work-up. She was advised to call immediately if she has any other concerning  symptoms in the interval. The patient voices understanding of current disease status and treatment options and is in agreement with the current care plan.  All questions were answered. The patient knows to call the clinic with any problems, questions or concerns. We can certainly see the patient much sooner if necessary.  Thank you so much for allowing me to participate in the care of [name]. I will continue to follow up the patient with you and assist in her care.  I spent 40 minutes counseling the patient face to face. The total time spent in the appointment was 60 minutes.  Disclaimer: This note was dictated with voice recognition software. Similar sounding words can inadvertently be transcribed and may not be corrected upon review.   Eilleen Kempf August 17, 2018, 1:56 PM

## 2018-08-21 ENCOUNTER — Telehealth: Payer: Self-pay | Admitting: *Deleted

## 2018-08-21 NOTE — Telephone Encounter (Signed)
Oncology Nurse Navigator Documentation  Oncology Nurse Navigator Flowsheets 08/21/2018  Navigator Location [facility]  Referral Date to RadOnc/MedOnc -  Navigator Encounter Type Telephone/I updated Dr. Julien Nordmann on scans and would like patient to be seen at [facility] next week.  I called but was unable to reach.  I did leave a vm message with my name and phone number to call.   Telephone Outgoing Call  Abnormal Finding Date -  Treatment Phase Abnormal Scans  Barriers/Navigation Needs Coordination of Care;Education  Education Other  Interventions Coordination of Care;Education  Coordination of Care Other  Education Method Verbal  Acuity Level 2  Time Spent with Patient 30

## 2018-08-24 ENCOUNTER — Telehealth: Payer: Self-pay | Admitting: *Deleted

## 2018-08-24 NOTE — Telephone Encounter (Signed)
Oncology Nurse Navigator Documentation  Oncology Nurse Navigator Flowsheets 08/24/2018  Navigator Location [facility]  Referral Date to RadOnc/MedOnc -  Navigator Encounter Type Telephone/I called patient to update her on follow up appt with Dr. Julien Nordmann and to see thoracic surgery on 08/31/2018.  Patient verbalized understanding of appt time and place.   Telephone Outgoing Call  Abnormal Finding Date -  Treatment Phase Abnormal Scans  Barriers/Navigation Needs Coordination of Care;Education  Education Other  Interventions Coordination of Care;Education  Coordination of Care Appts  Education Method Verbal  Acuity Level 1  Time Spent with Patient 15

## 2018-08-25 DIAGNOSIS — I10 Essential (primary) hypertension: Secondary | ICD-10-CM | POA: Diagnosis not present

## 2018-08-25 DIAGNOSIS — R739 Hyperglycemia, unspecified: Secondary | ICD-10-CM | POA: Diagnosis not present

## 2018-08-25 DIAGNOSIS — E7800 Pure hypercholesterolemia, unspecified: Secondary | ICD-10-CM | POA: Diagnosis not present

## 2018-08-28 ENCOUNTER — Ambulatory Visit (HOSPITAL_COMMUNITY)
Admission: RE | Admit: 2018-08-28 | Discharge: 2018-08-28 | Disposition: A | Discharge status: Home / Self Care | Payer: Medicare Other | Source: Ambulatory Visit | Attending: Physician Assistant | Admitting: Physician Assistant

## 2018-08-28 ENCOUNTER — Other Ambulatory Visit: Payer: Self-pay | Admitting: Internal Medicine

## 2018-08-28 ENCOUNTER — Other Ambulatory Visit: Payer: Self-pay

## 2018-08-28 DIAGNOSIS — I2510 Atherosclerotic heart disease of native coronary artery without angina pectoris: Secondary | ICD-10-CM | POA: Insufficient documentation

## 2018-08-28 DIAGNOSIS — C7931 Secondary malignant neoplasm of brain: Secondary | ICD-10-CM

## 2018-08-28 DIAGNOSIS — R918 Other nonspecific abnormal finding of lung field: Secondary | ICD-10-CM

## 2018-08-28 DIAGNOSIS — J439 Emphysema, unspecified: Secondary | ICD-10-CM | POA: Insufficient documentation

## 2018-08-28 DIAGNOSIS — C801 Malignant (primary) neoplasm, unspecified: Secondary | ICD-10-CM | POA: Diagnosis not present

## 2018-08-28 DIAGNOSIS — I700 Atherosclerosis of aorta: Secondary | ICD-10-CM | POA: Insufficient documentation

## 2018-08-28 DIAGNOSIS — R590 Localized enlarged lymph nodes: Secondary | ICD-10-CM | POA: Diagnosis not present

## 2018-08-28 LAB — GLUCOSE, CAPILLARY: Glucose-Capillary: 100 mg/dL — ABNORMAL HIGH (ref 70–99)

## 2018-08-28 MED ORDER — GADOBUTROL 1 MMOL/ML IV SOLN
8.0000 mL | Freq: Once | INTRAVENOUS | Status: AC | PRN
  Administered 2018-08-28: 8 mL via INTRAVENOUS

## 2018-08-28 MED ORDER — FLUDEOXYGLUCOSE F - 18 (FDG) INJECTION
9.1000 | Freq: Once | INTRAVENOUS | Status: AC | PRN
  Administered 2018-08-28: 9.1 via INTRAVENOUS

## 2018-08-28 NOTE — Progress Notes (Signed)
Location/Histology of Brain Tumor:  MRI brain 08/28/18 FINDINGS: Brain: 4 subcentimeter nodular and ring-enhancing abnormalities along the cortex of the right more than left cerebral consistent with metastatic disease in this setting:  1. High and anterior right frontal lobe on 11:69 2. High and lateral right frontal lobe on 11:61 3. High and posterior right frontal lobe on 11:61 4. Left occipital cortex on 11:48  Patient presented with symptoms of:  She did not have symptoms related to brain metastasis.   Past or anticipated interventions, if any, per neurosurgery:  N/A  Past or anticipated interventions, if any, per medical oncology:  08/17/18 Dr. Julien Nordmann PLAN: I had a lengthy discussion with the patient today about her current disease condition and further investigation to confirm her diagnosis. I personally and independently reviewed the scan images and discussed the result and showed the images to the patient today. I recommended for the patient to have a PET scan as well as MRI of the brain to complete the staging work-up. Once the PET scan is done, will refer the patient to Dr. Roxan Hockey for bronchoscopy and endobronchial ultrasound and biopsy. I will arrange for the patient to come back for follow-up visit in 2 weeks for reevaluation and discussion of her treatment options based on the biopsy and staging work-up. She was advised to call immediately if she has any other concerning symptoms in the interval. The patient voices understanding of current disease status and treatment options and is in agreement with the current care plan.  Dose of Decadron, if applicable: N/A  Recent neurologic symptoms, if any:   Seizures: No  Headaches: No  Nausea: No  Dizziness/ataxia: No  Difficulty with hand coordination: No  Focal numbness/weakness: No  Visual deficits/changes: She reports occasional "floaters" when they changed her medicine  Confusion/Memory deficits:  No  Painful bone metastases at present, if any: N/A  SAFETY ISSUES:  Prior radiation? No  Pacemaker/ICD? No  Possible current pregnancy? No  Is the patient on methotrexate? No  Additional Complaints / other details:

## 2018-08-29 ENCOUNTER — Ambulatory Visit
Admission: RE | Admit: 2018-08-29 | Discharge: 2018-08-29 | Disposition: A | Discharge status: Home / Self Care | Payer: Medicare Other | Source: Ambulatory Visit | Attending: Radiation Oncology | Admitting: Radiation Oncology

## 2018-08-29 ENCOUNTER — Encounter: Payer: Self-pay | Admitting: Radiation Oncology

## 2018-08-29 ENCOUNTER — Other Ambulatory Visit: Payer: Self-pay

## 2018-08-29 DIAGNOSIS — C7931 Secondary malignant neoplasm of brain: Secondary | ICD-10-CM

## 2018-08-29 DIAGNOSIS — Z87891 Personal history of nicotine dependence: Secondary | ICD-10-CM | POA: Diagnosis not present

## 2018-08-29 DIAGNOSIS — C801 Malignant (primary) neoplasm, unspecified: Secondary | ICD-10-CM | POA: Diagnosis not present

## 2018-08-29 DIAGNOSIS — R918 Other nonspecific abnormal finding of lung field: Secondary | ICD-10-CM | POA: Diagnosis not present

## 2018-08-29 HISTORY — DX: Type 2 diabetes mellitus without complications: E11.9

## 2018-08-29 NOTE — Progress Notes (Signed)
Radiation Oncology         [phone] ________________________________  Initial Outpatient Consultation by phone due to pandemic precautions, the patient could not access WebEx  Name: Carolyn [name] MRN: [id number]  Date: 08/29/2018  DOB: Apr 05, 1955  CC:Kim, Jeneen Rinks, MD  Curt Bears, MD   REFERRING PHYSICIAN: Curt Bears, MD  DIAGNOSIS: The encounter diagnosis was Brain metastases [facility]).    ICD-10-CM   1. Brain metastases [facility])  C79.31     Chief Complaint: I went to the ER and all this happened  HISTORY OF PRESENT ILLNESS::Carolyn [name] is a 63 y.o. female who presented to the emergency department on 08/07/2018 with chest pain. Because of her cardiac history, she underwent cardiac work-up that was unremarkable. Chest x-ray performed at that time showed a spiculated appearing nodule of the left upper lobe, measuring approximately 2.6 cm. This was followed by a CT scan of the chest that showed the spiculated left upper lobe lung mass, measuring 28 mm, most compatible with bronchogenic carcinoma. There was malignant appearing left hilar and pre-vascular mediastinal lymphadenopathy. There was also a smaller, indeterminate contralateral subpleural right upper lobe 9 mm lung nodule.  The patient was seen in the multidisciplinary thoracic oncology clinic on 08/17/2018 for further evaluation and recommendation. At that time, she was diagnosed with highly suspicious stage IIIA/B non-small cell lung cancer pending further staging work-up and tissue diagnosis. She was recommended to have a PET scan as well as brain MRI to complete the staging work-up.  MRI of the brain on 08/28/2018 demonstrated 4 sub-centimeter brain metastases:  1. High and anterior right frontal lobe on 11:69 2. High and lateral right frontal lobe on 11:61 3. High and posterior right frontal lobe on 11:61 4. Left occipital cortex on 11:48  PET scan on 08/28/2018 showed the dominant left upper lobe pulmonary nodule  hypermetabolic with SUV max = 27.7. Hypermetabolic lymphadenopathy in the left hilum is compatible with metastatic involvement. The 9 mm right upper lobe pulmonary nodule demonstrates FDG uptake, suspicious for neoplasm. There was also a small hypermetabolic focus in the right hilum without underlying lymphadenopathy.   She has a history for smoking around 0.5 pack/day for 30 years and quit 18 years ago. She has no history of alcohol or drug abuse.  No prior cancer.  No HA or nausea. Not on decadron. No seizures.  No dizziness or ataxia or issues with coordination. No sensory or motor deficits.  No confusion.  She notes occasional floaters in her vision.  PREVIOUS RADIATION THERAPY: No  PAST MEDICAL HISTORY:  has a past medical history of Anginal pain ([place]), Arthritis, Asthma, Coronary artery disease, Diabetes mellitus without complication ([place]), GERD (gastroesophageal reflux disease), H/O hiatal hernia, Heart murmur, HPV in female, Hypertension, Persistent headaches, Pneumonia, Shortness of breath, and Vaginal dryness.    PAST SURGICAL HISTORY: Past Surgical History:  Procedure Laterality Date  . ABDOMINAL HYSTERECTOMY    . bone spur    . CARDIAC CATHETERIZATION  06/13/2012  . carpel tunnel surgery    . COLONOSCOPY     9 years ago  . CORONARY ANGIOPLASTY  06/13/2012  . LEFT HEART CATHETERIZATION WITH CORONARY ANGIOGRAM N/A 06/13/2012   Procedure: LEFT HEART CATHETERIZATION WITH CORONARY ANGIOGRAM;  Surgeon: Laverda Page, MD;  Location[facility] CATH LAB;  Service: Cardiovascular;  Laterality: N/A;  . NECK SURGERY    . rotator cuff surgery Right 2015    FAMILY HISTORY: family history includes Asthma in her father; Breast cancer in an  other family member; Diabetes in her brother, mother, and sister; Glaucoma in her mother; Hypertension in her father and mother.  SOCIAL HISTORY:  reports that she quit smoking about 19 years ago. She has a 15.00 pack-year smoking history. She has never used  smokeless tobacco. She reports that she does not drink alcohol or use drugs.  ALLERGIES: Patient has no known allergies.  MEDICATIONS:  Current Outpatient Medications  Medication Sig Dispense Refill  . acetaminophen (TYLENOL) 500 MG tablet Take 1,000 mg by mouth every 6 (six) hours as needed for mild pain, fever or headache.    Marland Kitchen amLODipine (NORVASC) 5 MG tablet Take 5 mg by mouth daily.    Marland Kitchen aspirin EC 81 MG tablet Take 81 mg by mouth daily.    . carvedilol (COREG) 25 MG tablet Take 1 tablet by mouth 2 (two) times daily. 1/2 tablet    . cetirizine (ZYRTEC) 10 MG tablet Take 10 mg by mouth daily.    . Cholecalciferol (VITAMIN D-1000 MAX ST) 25 MCG (1000 UT) tablet Take 1 tablet by mouth daily.    . Cholecalciferol (VITAMIN D3) 1.25 MG (50000 UT) CAPS Vitamin D3  Take 5000 IU daily    . Chromium Picolinate 1000 MCG TABS Take 1,000 mg by mouth daily.    Marland Kitchen co-enzyme Q-10 30 MG capsule Take 30 mg by mouth 3 (three) times daily.    . cyclobenzaprine (FLEXERIL) 10 MG tablet Take 10 mg by mouth 3 (three) times daily as needed for muscle spasms.    Marland Kitchen esomeprazole (NEXIUM) 10 MG packet Take 10 mg by mouth daily before breakfast.    . metFORMIN (GLUCOPHAGE) 500 MG tablet Take 500 mg by mouth at bedtime.    . methocarbamol (ROBAXIN) 500 MG tablet Take 1 tablet (500 mg total) by mouth every 6 (six) hours as needed for muscle spasms. 21 tablet 0  . metoprolol tartrate (LOPRESSOR) 25 MG tablet Take 25 mg by mouth 2 (two) times daily.    . Omega-3 Fatty Acids (OMEGA 3 PO) Take 1 tablet by mouth every evening. Omega Red    . ONETOUCH ULTRA test strip USE 1 STRIP TO CHECK GLUCOSE THREE TIMES DAILY    . Pitavastatin Calcium (LIVALO) 4 MG TABS Take 4 mg by mouth daily.    Vladimir Faster Glycol-Propyl Glycol (SYSTANE) 0.4-0.3 % SOLN Apply 1 drop to eye daily.    . psyllium (METAMUCIL) 58.6 % packet Take 1 packet by mouth at bedtime as needed.     Marland Kitchen telmisartan (MICARDIS) 40 MG tablet Take 40 mg by mouth daily.     Marland Kitchen triamterene-hydrochlorothiazide (MAXZIDE-25) 37.5-25 MG per tablet Take 1 tablet by mouth daily.    Marland Kitchen zolpidem (AMBIEN) 10 MG tablet Take 10 mg by mouth at bedtime as needed.    Marland Kitchen HYDROcodone-acetaminophen (NORCO/VICODIN) 5-325 MG tablet Take 1 tablet by mouth every 4 (four) hours as needed. (Patient not taking: Reported on 08/17/2018) 15 tablet 0  . nitroGLYCERIN (NITROSTAT) 0.4 MG SL tablet Place 1 tablet (0.4 mg total) under the tongue every 5 (five) minutes as needed for chest pain. (Patient not taking: Reported on 08/17/2018) 30 tablet 12   No current facility-administered medications for this encounter.     REVIEW OF SYSTEMS:  As above  PHYSICAL EXAM:  vitals were not taken for this visit.   NAD   LABORATORY DATA:  Lab Results  Component Value Date   WBC 6.4 08/17/2018   HGB 11.3 (L) 08/17/2018   HCT 37.6  08/17/2018   MCV 76.1 (L) 08/17/2018   PLT 350 08/17/2018   CMP     Component Value Date/Time   NA 141 08/17/2018 1336   K 4.1 08/17/2018 1336   CL 104 08/17/2018 1336   CO2 28 08/17/2018 1336   GLUCOSE 98 08/17/2018 1336   BUN 6 (L) 08/17/2018 1336   CREATININE 1.00 08/17/2018 1336   CALCIUM 9.4 08/17/2018 1336   PROT 8.0 08/17/2018 1336   ALBUMIN 3.7 08/17/2018 1336   AST 22 08/17/2018 1336   ALT 13 08/17/2018 1336   ALKPHOS 82 08/17/2018 1336   BILITOT 0.3 08/17/2018 1336   GFRNONAA 59 (L) 08/17/2018 1336   GFRAA >60 08/17/2018 1336         RADIOGRAPHY: Ct Chest W Contrast  Result Date: 08/07/2018 CLINICAL DATA:  63 year old female with chest pain and possible spiculated left upper lung nodule on earlier portable chest. EXAM: CT CHEST WITH CONTRAST TECHNIQUE: Multidetector CT imaging of the chest was performed during intravenous contrast administration. CONTRAST:  46mL OMNIPAQUE IOHEXOL 300 MG/ML  SOLN COMPARISON:  Portable chest earlier today. Chest radiographs 02/01/2013. FINDINGS: Cardiovascular: Calcified aortic atherosclerosis. Calcified coronary artery  atherosclerosis and/or stents. No cardiomegaly or pericardial effusion. Mediastinum/Nodes: Prevascular mediastinal lymphadenopathy is heterogeneous and about 18 millimeters short axis. There is similar low-density left hilar lymphadenopathy, up to 15 millimeter short axis. Smaller 8 millimeter contralateral right hilar node. No precarinal or other mediastinal lymphadenopathy. Possible small hiatal hernia. Lungs/Pleura: Spiculated left upper lobe lung mass measuring 23 x 28 millimeters (including the spiculations as seen on coronal image 66). Mild regional architectural distortion. In the contralateral right upper lobe there is a smaller subpleural 9 millimeter lung nodule on series 4, image 41. Major airways are patent. No pleural effusion or other abnormal pulmonary opacity. Upper Abdomen: Negative visible liver, gallbladder, spleen, pancreas, adrenal glands, kidneys, and intra-abdominal stomach. Diverticulosis in the distal transverse colon. Musculoskeletal: Partially visible cervical ACDF. No acute or suspicious osseous lesion identified. IMPRESSION: 1. 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 Malignant appearing left hilar and prevascular mediastinal lymphadenopathy. 2. Smaller, indeterminate contralateral subpleural right upper lobe 9 mm lung nodule. 3. Recommend referral to [facility]). Electronically Signed   By: Genevie Ann M.D.   On: 08/07/2018 17:29   Mr Jeri Cos DQ Contrast  Result Date: 08/28/2018 CLINICAL DATA:  Possible lung cancer.  Staging. EXAM: MRI HEAD WITHOUT AND WITH CONTRAST TECHNIQUE: Multiplanar, multiecho pulse sequences of the brain and surrounding structures were obtained without and with intravenous contrast. CONTRAST:  8 cc Gadavist intravenous COMPARISON:  None. FINDINGS: Brain: 4 subcentimeter nodular and ring-enhancing abnormalities along the cortex of the right more than left cerebral  consistent with metastatic disease in this setting: 1. High and anterior right frontal lobe on 11:69 2. High and lateral right frontal lobe on 11:61 3. High and posterior right frontal lobe on 11:61 4. Left occipital cortex on 11:48 Minimal associated vasogenic edema. Band of signal abnormality in the posterior left cerebellum without underlying enhancement to suggest metastatic disease. No acute hemorrhage, hydrocephalus, or infarct. Vascular: Major flow voids and vascular enhancements are preserved Skull and upper cervical spine: Negative for marrow lesion Sinuses/Orbits: Negative These results will be called to the ordering clinician or representative by the Radiologist Assistant, and communication documented in the PACS or zVision Dashboard. IMPRESSION: Positive for 4 subcentimeter brain metastases. Electronically Signed   By: Monte Fantasia M.D.   On:  08/28/2018 09:44   Nm Pet Image Initial (pi) Skull Base To Thigh  Result Date: 08/28/2018 CLINICAL DATA:  Initial treatment strategy for lung mass. EXAM: NUCLEAR MEDICINE PET SKULL BASE TO THIGH TECHNIQUE: 9.1 mCi F-18 FDG was injected intravenously. Full-ring PET imaging was performed from the skull base to thigh after the radiotracer. CT data was obtained and used for attenuation correction and anatomic localization. Fasting blood glucose: 100 mg/dl COMPARISON:  Chest CT 08/07/2018 FINDINGS: Mediastinal blood pool activity: SUV max 2.7 NECK: No hypermetabolic lymph nodes in the neck. Incidental CT findings: none CHEST: Left upper lobe pulmonary nodule seen on recent chest CT measures 2.3 cm with SUV max = 12.5. 9 mm peripheral right upper lobe nodule (05/3) is hypermetabolic with SUV max = 2.7. Left hilar lymphadenopathy is hypermetabolic with SUV max = 9.8 although individual lymph nodes are not well demonstrated on this noncontrast exam. Small indeterminate hypermetabolic demonstrates focus in the right hilum SUV max = 4.0. No underlying lymphadenopathy  evident in the right hilum by CT. Incidental CT findings: Centrilobular emphsyema noted. Coronary artery calcification is evident. Atherosclerotic calcification is noted in the wall of the thoracic aorta. ABDOMEN/PELVIS: No abnormal hypermetabolic activity within the liver, pancreas, adrenal glands, or spleen. No hypermetabolic lymph nodes in the abdomen or pelvis. Incidental CT findings: There is abdominal aortic atherosclerosis without aneurysm. SKELETON: No focal hypermetabolic activity to suggest skeletal metastasis. Incidental CT findings: Sclerotic changes noted in the pubic bones bilaterally without hypermetabolism. IMPRESSION: 1. Dominant left upper lobe pulmonary nodule is hypermetabolic consistent with primary bronchogenic neoplasm. 2. Hypermetabolic lymphadenopathy in the left hilum compatible with metastatic involvement. 3. 9 mm right upper lobe pulmonary nodule demonstrates FDG uptake, suspicious for neoplasm. Metastatic disease or synchronous bronchogenic carcinoma would be considerations. 4. Small hypermetabolic focus in the right hilum without underlying lymphadenopathy. Metastatic involvement not excluded. 5.  Aortic Atherosclerois (ICD10-170.0) 6.  Emphysema. ([id number]) Electronically Signed   By: Misty Stanley M.D.   On: 08/28/2018 13:51   Dg Chest Port 1 View  Result Date: 08/07/2018 CLINICAL DATA:  Chest pain EXAM: PORTABLE CHEST 1 VIEW COMPARISON:  None. FINDINGS: The heart size and mediastinal contours are within normal limits. There is a spiculated appearing nodule of the left upper lobe measuring approximately 2.6 cm in projection. The visualized skeletal structures are unremarkable. IMPRESSION: 1.  No acute appearing airspace opacity. 2. There is a spiculated appearing nodule of the left upper lobe measuring approximately 2.6 cm in projection, concerning for malignancy. Recommend CT to further evaluate. Electronically Signed   By: Eddie Candle M.D.   On: 08/07/2018 14:55       IMPRESSION/PLAN: This is a very pleasant 63 y.o. female with metastatic disease to the brain. She understands that obtaining pathology (biopsy) from the lung is important to verify the type of cancer she has; I suspect this is NSCLC which would be amenable to stereotactic SRS. This would require a 3T MRI for tx planning.  IF this is a less common type of lung cancer (small cell carcinoma) then whole brain RT over 2-3 wks would be warranted. It is possible she will qualify for a clinical trial re: brain radiotherapy.  I had a lengthy discussion with the patient after reviewing their MRI results with them.  We spoke about whole brain radiotherapy versus stereotactic radiosurgery to the brain. We spoke about the differing risks benefits and side effects of both of these treatments.  She will be discussed at our CNS tumor board and  I will look forward to seeing her for treatment planning once her diagnosis is clarified with pathology. Cardiothoracic surgery will see her soon for biopsy.  She seems overwhelmed; emotional support given today. I will also refer to social work.  This encounter was provided by telemedicine platform telephone as the patient could not  access WebEx.  The patient has given verbal consent for this type of encounter and has been advised to only accept a meeting of this type in a secure network environment. The time spent during this encounter was over 30 minutes. The attendants for this meeting include Eppie Gibson  and [name].  During the encounter, Eppie Gibson was located at [facility].  [name] was located at home.    __________________________________________   Eppie Gibson, MD  This document serves as a record of services personally performed by Eppie Gibson, MD. It was created on her behalf by Rae Lips, a trained medical scribe. The creation of this record is based on the scribe's personal  observations and the provider's statements to them. This document has been checked and approved by the attending provider.

## 2018-08-30 ENCOUNTER — Encounter: Payer: Self-pay | Admitting: Radiation Oncology

## 2018-08-30 DIAGNOSIS — C7931 Secondary malignant neoplasm of brain: Secondary | ICD-10-CM | POA: Insufficient documentation

## 2018-08-31 ENCOUNTER — Encounter: Payer: Self-pay | Admitting: Thoracic Surgery (Cardiothoracic Vascular Surgery)

## 2018-08-31 ENCOUNTER — Inpatient Hospital Stay (HOSPITAL_BASED_OUTPATIENT_CLINIC_OR_DEPARTMENT_OTHER): Payer: Medicare Other | Admitting: Internal Medicine

## 2018-08-31 ENCOUNTER — Other Ambulatory Visit: Payer: Self-pay | Admitting: Radiation Therapy

## 2018-08-31 ENCOUNTER — Encounter: Payer: Self-pay | Admitting: *Deleted

## 2018-08-31 ENCOUNTER — Institutional Professional Consult (permissible substitution) (INDEPENDENT_AMBULATORY_CARE_PROVIDER_SITE_OTHER): Payer: Medicare Other | Admitting: Thoracic Surgery (Cardiothoracic Vascular Surgery)

## 2018-08-31 ENCOUNTER — Encounter: Payer: Self-pay | Admitting: Internal Medicine

## 2018-08-31 ENCOUNTER — Other Ambulatory Visit: Payer: Self-pay | Admitting: *Deleted

## 2018-08-31 ENCOUNTER — Other Ambulatory Visit: Payer: Self-pay

## 2018-08-31 VITALS — BP 171/72 | HR 60 | Temp 98.7°F | Resp 16 | Ht 61.0 in | Wt 177.8 lb

## 2018-08-31 DIAGNOSIS — C801 Malignant (primary) neoplasm, unspecified: Secondary | ICD-10-CM | POA: Diagnosis not present

## 2018-08-31 DIAGNOSIS — R918 Other nonspecific abnormal finding of lung field: Secondary | ICD-10-CM | POA: Diagnosis not present

## 2018-08-31 DIAGNOSIS — R911 Solitary pulmonary nodule: Secondary | ICD-10-CM | POA: Diagnosis not present

## 2018-08-31 DIAGNOSIS — I25118 Atherosclerotic heart disease of native coronary artery with other forms of angina pectoris: Secondary | ICD-10-CM

## 2018-08-31 DIAGNOSIS — C7931 Secondary malignant neoplasm of brain: Secondary | ICD-10-CM

## 2018-08-31 DIAGNOSIS — I10 Essential (primary) hypertension: Secondary | ICD-10-CM | POA: Diagnosis not present

## 2018-08-31 DIAGNOSIS — Z87891 Personal history of nicotine dependence: Secondary | ICD-10-CM

## 2018-08-31 NOTE — Progress Notes (Signed)
[facility] [phone]   [phone]  OFFICE PROGRESS NOTE  [name], MD [address] [place] [address]  DIAGNOSIS: Likely stage IV (T1c, N1, M1 C) lung cancer pending tissue diagnosis and presented with left upper lobe lung nodule in addition to right upper lobe lung nodule with left hilar lymphadenopathy as well as multiple brain metastasis.  PRIOR THERAPY: None  CURRENT THERAPY: None  INTERVAL HISTORY: Carolyn [name] 63 y.o. female returns to the clinic today for follow-up visit.  The patient is feeling fine today with no concerning complaints.  She denied having any current chest pain, shortness of breath, cough or hemoptysis.  She denied having any fever or chills.  She has no nausea, vomiting, diarrhea or constipation she has no recent weight loss or night sweats.  She underwent several studies recently including MRI of the brain that showed 4 subcentimeter brain lesions suspicious for metastatic disease from the lung mass.  She also had a PET scan performed recently and she is here for evaluation and discussion of her imaging studies and next step in management of her condition.  MEDICAL HISTORY: Past Medical History:  Diagnosis Date  . Anginal pain ([place])    " with exertion "  . Arthritis    " 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 "  . Asthma    h/o  . Coronary artery disease   . Diabetes mellitus without complication ([place])    Type II  . GERD (gastroesophageal reflux disease)   . H/O hiatal hernia   . Heart murmur   . 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 Hypertension   . Persistent headaches    h/o  . Pneumonia    hx of PNA  . Shortness of breath   . Vaginal dryness    h/o    ALLERGIES:  has No Known Allergies.  MEDICATIONS:  Current Outpatient Medications  Medication Sig Dispense Refill  . acetaminophen (TYLENOL) 500 MG tablet Take 1,000 mg by mouth every 6 (six) hours as needed for mild pain, fever or headache.    Marland Kitchen amLODipine  (NORVASC) 5 MG tablet Take 5 mg by mouth daily.    Marland Kitchen aspirin EC 81 MG tablet Take 81 mg by mouth daily.    . carvedilol (COREG) 25 MG tablet Take 1 tablet by mouth 2 (two) times daily. 1/2 tablet    . cetirizine (ZYRTEC) 10 MG tablet Take 10 mg by mouth daily.    . Cholecalciferol (VITAMIN D-1000 MAX ST) 25 MCG (1000 UT) tablet Take 1 tablet by mouth daily.    . Cholecalciferol (VITAMIN D3) 1.25 MG (50000 UT) CAPS Vitamin D3  Take 5000 IU daily    . Chromium Picolinate 1000 MCG TABS Take 1,000 mg by mouth daily.    Marland Kitchen co-enzyme Q-10 30 MG capsule Take 30 mg by mouth 3 (three) times daily.    . cyclobenzaprine (FLEXERIL) 10 MG tablet Take 10 mg by mouth 3 (three) times daily as needed for muscle spasms.    Marland Kitchen esomeprazole (NEXIUM) 10 MG packet Take 10 mg by mouth daily before breakfast.    . HYDROcodone-acetaminophen (NORCO/VICODIN) 5-325 MG tablet Take 1 tablet by mouth every 4 (four) hours as needed. (Patient not taking: Reported on 08/17/2018) 15 tablet 0  . metFORMIN (GLUCOPHAGE) 500 MG tablet Take 500 mg by mouth at bedtime.    . methocarbamol (ROBAXIN) 500 MG tablet Take 1 tablet (500 mg total) by  mouth every 6 (six) hours as needed for muscle spasms. 21 tablet 0  . metoprolol tartrate (LOPRESSOR) 25 MG tablet Take 25 mg by mouth 2 (two) times daily.    . nitroGLYCERIN (NITROSTAT) 0.4 MG SL tablet Place 1 tablet (0.4 mg total) under the tongue every 5 (five) minutes as needed for chest pain. (Patient not taking: Reported on 08/17/2018) 30 tablet 12  . Omega-3 Fatty Acids (OMEGA 3 PO) Take 1 tablet by mouth every evening. Omega Red    . ONETOUCH ULTRA test strip USE 1 STRIP TO CHECK GLUCOSE THREE TIMES DAILY    . Pitavastatin Calcium (LIVALO) 4 MG TABS Take 4 mg by mouth daily.    Vladimir Faster Glycol-Propyl Glycol (SYSTANE) 0.4-0.3 % SOLN Apply 1 drop to eye daily.    . psyllium (METAMUCIL) 58.6 % packet Take 1 packet by mouth at bedtime as needed.     Marland Kitchen telmisartan (MICARDIS) 40 MG tablet Take 40  mg by mouth daily.    Marland Kitchen triamterene-hydrochlorothiazide (MAXZIDE-25) 37.5-25 MG per tablet Take 1 tablet by mouth daily.    Marland Kitchen zolpidem (AMBIEN) 10 MG tablet Take 10 mg by mouth at bedtime as needed.     No current facility-administered medications for this visit.     SURGICAL HISTORY:  Past Surgical History:  Procedure Laterality Date  . ABDOMINAL HYSTERECTOMY    . bone spur    . CARDIAC CATHETERIZATION  06/13/2012  . carpel tunnel surgery    . COLONOSCOPY     9 years ago  . CORONARY ANGIOPLASTY  06/13/2012  . LEFT HEART CATHETERIZATION WITH CORONARY ANGIOGRAM N/A 06/13/2012   Procedure: LEFT HEART CATHETERIZATION WITH CORONARY ANGIOGRAM;  Surgeon: Laverda Page, MD;  Location: [facility] CATH LAB;  Service: Cardiovascular;  Laterality: N/A;  . NECK SURGERY    . rotator cuff surgery Right 2015    REVIEW OF SYSTEMS:  Constitutional: negative Eyes: negative Ears, nose, mouth, throat, and face: negative Respiratory: positive for dyspnea on exertion Cardiovascular: negative Gastrointestinal: negative Genitourinary:negative Integument/breast: negative Hematologic/lymphatic: negative Musculoskeletal:negative Neurological: negative Behavioral/Psych: negative Endocrine: negative Allergic/Immunologic: negative   PHYSICAL EXAMINATION: General appearance: alert, cooperative, fatigued and no distress Head: Normocephalic, without obvious abnormality, atraumatic Neck: no adenopathy, no JVD, supple, symmetrical, trachea midline and thyroid not enlarged, symmetric, no tenderness/mass/nodules Lymph nodes: Cervical, supraclavicular, and axillary nodes normal. Resp: clear to auscultation bilaterally Back: symmetric, no curvature. ROM normal. No CVA tenderness. Cardio: regular rate and rhythm, S1, S2 normal, no murmur, click, rub or gallop GI: soft, non-tender; bowel sounds normal; no masses,  no organomegaly Extremities: extremities normal, atraumatic, no cyanosis or edema Neurologic: Alert and  oriented X 3, normal strength and tone. Normal symmetric reflexes. Normal coordination and gait  ECOG PERFORMANCE STATUS: 1 - Symptomatic but completely ambulatory  Blood pressure (!) 171/72, pulse 60, temperature 98.7 F (37.1 C), temperature source Temporal, resp. rate 16, height 5\' 1"  (1.549 m), weight 177 lb 12.8 oz (80.6 kg), SpO2 100 %.  LABORATORY DATA: Lab Results  Component Value Date   WBC 6.4 08/17/2018   HGB 11.3 (L) 08/17/2018   HCT 37.6 08/17/2018   MCV 76.1 (L) 08/17/2018   PLT 350 08/17/2018      Chemistry      Component Value Date/Time   NA 141 08/17/2018 1336   K 4.1 08/17/2018 1336   CL 104 08/17/2018 1336   CO2 28 08/17/2018 1336   BUN 6 (L) 08/17/2018 1336   CREATININE 1.00 08/17/2018 1336  Component Value Date/Time   CALCIUM 9.4 08/17/2018 1336   ALKPHOS 82 08/17/2018 1336   AST 22 08/17/2018 1336   ALT 13 08/17/2018 1336   BILITOT 0.3 08/17/2018 1336       RADIOGRAPHIC STUDIES: Ct Chest W Contrast  Result Date: 08/07/2018 CLINICAL DATA:  63 year old female with chest pain and possible spiculated left upper lung nodule on earlier portable chest. EXAM: CT CHEST WITH CONTRAST TECHNIQUE: Multidetector CT imaging of the chest was performed during intravenous contrast administration. CONTRAST:  24mL OMNIPAQUE IOHEXOL 300 MG/ML  SOLN COMPARISON:  Portable chest earlier today. Chest radiographs 02/01/2013. FINDINGS: Cardiovascular: Calcified aortic atherosclerosis. Calcified coronary artery atherosclerosis and/or stents. No cardiomegaly or pericardial effusion. Mediastinum/Nodes: Prevascular mediastinal lymphadenopathy is heterogeneous and about 18 millimeters short axis. There is similar low-density left hilar lymphadenopathy, up to 15 millimeter short axis. Smaller 8 millimeter contralateral right hilar node. No precarinal or other mediastinal lymphadenopathy. Possible small hiatal hernia. Lungs/Pleura: Spiculated left upper lobe lung mass measuring 23 x  28 millimeters (including the spiculations as seen on coronal image 66). Mild regional architectural distortion. In the contralateral right upper lobe there is a smaller subpleural 9 millimeter lung nodule on series 4, image 41. Major airways are patent. No pleural effusion or other abnormal pulmonary opacity. Upper Abdomen: Negative visible liver, gallbladder, spleen, pancreas, adrenal glands, kidneys, and intra-abdominal stomach. Diverticulosis in the distal transverse colon. Musculoskeletal: Partially visible cervical ACDF. No acute or suspicious osseous lesion identified. IMPRESSION: 1. 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 Malignant appearing left hilar and prevascular mediastinal lymphadenopathy. 2. Smaller, indeterminate contralateral subpleural right upper lobe 9 mm lung nodule. 3. Recommend referral to [facility]). Electronically Signed   By: Genevie Ann M.D.   On: 08/07/2018 17:29   Mr Jeri Cos TZ Contrast  Result Date: 08/28/2018 CLINICAL DATA:  Possible lung cancer.  Staging. EXAM: MRI HEAD WITHOUT AND WITH CONTRAST TECHNIQUE: Multiplanar, multiecho pulse sequences of the brain and surrounding structures were obtained without and with intravenous contrast. CONTRAST:  8 cc Gadavist intravenous COMPARISON:  None. FINDINGS: Brain: 4 subcentimeter nodular and ring-enhancing abnormalities along the cortex of the right more than left cerebral consistent with metastatic disease in this setting: 1. High and anterior right frontal lobe on 11:69 2. High and lateral right frontal lobe on 11:61 3. High and posterior right frontal lobe on 11:61 4. Left occipital cortex on 11:48 Minimal associated vasogenic edema. Band of signal abnormality in the posterior left cerebellum without underlying enhancement to suggest metastatic disease. No acute hemorrhage, hydrocephalus, or infarct. Vascular: Major flow voids and vascular  enhancements are preserved Skull and upper cervical spine: Negative for marrow lesion Sinuses/Orbits: Negative These results will be called to the ordering clinician or representative by the Radiologist Assistant, and communication documented in the PACS or zVision Dashboard. IMPRESSION: Positive for 4 subcentimeter brain metastases. Electronically Signed   By: Monte Fantasia M.D.   On: 08/28/2018 09:44   Nm Pet Image Initial (pi) Skull Base To Thigh  Result Date: 08/28/2018 CLINICAL DATA:  Initial treatment strategy for lung mass. EXAM: NUCLEAR MEDICINE PET SKULL BASE TO THIGH TECHNIQUE: 9.1 mCi F-18 FDG was injected intravenously. Full-ring PET imaging was performed from the skull base to thigh after the radiotracer. CT data was obtained and used for attenuation correction and anatomic localization. Fasting blood glucose: 100 mg/dl COMPARISON:  Chest CT 08/07/2018 FINDINGS: Mediastinal blood pool activity: SUV max 2.7 NECK: No  hypermetabolic lymph nodes in the neck. Incidental CT findings: none CHEST: Left upper lobe pulmonary nodule seen on recent chest CT measures 2.3 cm with SUV max = 12.5. 9 mm peripheral right upper lobe nodule (27/5) is hypermetabolic with SUV max = 2.7. Left hilar lymphadenopathy is hypermetabolic with SUV max = 9.8 although individual lymph nodes are not well demonstrated on this noncontrast exam. Small indeterminate hypermetabolic demonstrates focus in the right hilum SUV max = 4.0. No underlying lymphadenopathy evident in the right hilum by CT. Incidental CT findings: Centrilobular emphsyema noted. Coronary artery calcification is evident. Atherosclerotic calcification is noted in the wall of the thoracic aorta. ABDOMEN/PELVIS: No abnormal hypermetabolic activity within the liver, pancreas, adrenal glands, or spleen. No hypermetabolic lymph nodes in the abdomen or pelvis. Incidental CT findings: There is abdominal aortic atherosclerosis without aneurysm. SKELETON: No focal  hypermetabolic activity to suggest skeletal metastasis. Incidental CT findings: Sclerotic changes noted in the pubic bones bilaterally without hypermetabolism. IMPRESSION: 1. Dominant left upper lobe pulmonary nodule is hypermetabolic consistent with primary bronchogenic neoplasm. 2. Hypermetabolic lymphadenopathy in the left hilum compatible with metastatic involvement. 3. 9 mm right upper lobe pulmonary nodule demonstrates FDG uptake, suspicious for neoplasm. Metastatic disease or synchronous bronchogenic carcinoma would be considerations. 4. Small hypermetabolic focus in the right hilum without underlying lymphadenopathy. Metastatic involvement not excluded. 5.  Aortic Atherosclerois (ICD10-170.0) 6.  Emphysema. ([id number]) Electronically Signed   By: Misty Stanley M.D.   On: 08/28/2018 13:51   Dg Chest Port 1 View  Result Date: 08/07/2018 CLINICAL DATA:  Chest pain EXAM: PORTABLE CHEST 1 VIEW COMPARISON:  None. FINDINGS: The heart size and mediastinal contours are within normal limits. There is a spiculated appearing nodule of the left upper lobe measuring approximately 2.6 cm in projection. The visualized skeletal structures are unremarkable. IMPRESSION: 1.  No acute appearing airspace opacity. 2. There is a spiculated appearing nodule of the left upper lobe measuring approximately 2.6 cm in projection, concerning for malignancy. Recommend CT to further evaluate. Electronically Signed   By: Eddie Candle M.D.   On: 08/07/2018 14:55    ASSESSMENT AND PLAN: This is a very pleasant 63 years old African-American female with likely stage IV (T1c, N1, M1C) lung cancer pending tissue diagnosis and presented with left upper lobe lung nodule in addition to right upper lobe pulmonary nodule as well as left hilar adenopathy and metastatic lesion to the brain. I had a lengthy discussion with the patient today about her condition and further investigation to confirm her diagnosis.  I recommended for the patient to  see Dr. Kipp Brood for evaluation and consideration of bronchoscopy with endobronchial ultrasound and biopsy. For the new brain metastasis, the patient was referred to Dr. Isidore Moos and she is expected to start stereotactic radiotherapy to the brain lesion soon. After the confirmation of the tissue diagnosis, I will arrange for the patient to come back for follow-up visit for detailed discussion of her treatment options. For hypertension we will strongly recommend for the patient to take her blood pressure medication as prescribed and to consult with her primary care physician for adjustment of her medication if needed. The patient was advised to call immediately if she has any concerning symptoms in the interval. The patient voices understanding of current disease status and treatment options and is in agreement with the current care plan.  All questions were answered. The patient knows to call the clinic with any problems, questions or concerns. We can certainly see the patient much  sooner if necessary.  I spent 15 minutes counseling the patient face to face. The total time spent in the appointment was 25 minutes.  Disclaimer: This note was dictated with voice recognition software. Similar sounding words can inadvertently be transcribed and may not be corrected upon review.

## 2018-08-31 NOTE — Progress Notes (Signed)
[address]       [place] [address]             [id number]                    Teria L Julius [facility] Medical Record #[id number] Date of Birth: [birth year]  Referring: Curt Bears, MD Primary Care: [name], MD Primary Cardiologist: No primary care provider on file.  Chief Complaint:   No chief complaint on file.   History of Present Illness:    Carolyn [name] 63 y.o. female is seen in the office today for a left upper lobe pulmonary nodule that was found incidentally on CT scan.  She originally presented to the ED with chest pain, and underwent a CXR which found this nodule.  She subsequently underwent a chest CT which redemonstrated this lesion as well as a right upper lobe nodule.  On PET/CT, both lesions were avid, along with left hilar avidity.  She also had an MRI brain, which showed 4 lesions concerning for metastasis.  In regards to her symptoms, she states that she has only had some mild neck pain, and occasional sharp scalp pain.         Smoking Hx: quit smoking in the early 2000s   Current Activity/ Functional Status:  Patient is independent with mobility/ambulation, transfers, ADL's, IADL's.   Zubrod Score: At the time of surgery this patient's most appropriate activity status/level should be described as: []     0    Normal activity, no symptoms []     1    Restricted in physical strenuous activity but ambulatory, able to do out light work []     2    Ambulatory and capable of self care, unable to do work activities, up and about               >50 % of waking hours                              []     3    Only limited self care, in bed greater than 50% of waking hours []     4    Completely disabled, no self care, confined to bed or chair []     5    Moribund   Past Medical History:  Diagnosis Date  . Anginal pain ([place])    " with exertion "  . Arthritis    " 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 "  . Asthma    h/o  . Coronary artery disease    . Diabetes mellitus without complication ([place])    Type II  . GERD (gastroesophageal reflux disease)   . H/O hiatal hernia   . Heart murmur   . 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 Hypertension   . Persistent headaches    h/o  . Pneumonia    hx of PNA  . Shortness of breath   . Vaginal dryness    h/o    Past Surgical History:  Procedure Laterality Date  . ABDOMINAL HYSTERECTOMY    . bone spur    . CARDIAC CATHETERIZATION  06/13/2012  . carpel tunnel surgery    . COLONOSCOPY     9 years ago  . CORONARY ANGIOPLASTY  06/13/2012  . LEFT HEART CATHETERIZATION WITH CORONARY ANGIOGRAM N/A 06/13/2012   Procedure: LEFT HEART CATHETERIZATION WITH CORONARY ANGIOGRAM;  Surgeon:  Laverda Page, MD;  Location: [facility] CATH LAB;  Service: Cardiovascular;  Laterality: N/A;  . NECK SURGERY    . rotator cuff surgery Right 2015    Family History  Problem Relation Age of Onset  . Breast cancer Other   . Diabetes Mother   . Glaucoma Mother   . Hypertension Mother   . Hypertension Father   . Asthma Father   . Diabetes Sister   . Diabetes Brother      Social History   Tobacco Use  Smoking Status Former Smoker  . Packs/day: 0.50  . Years: 30.00  . Pack years: 15.00  . Quit date: 02/09/1999  . Years since quitting: 19.5  Smokeless Tobacco Never Used    Social History   Substance and Sexual Activity  Alcohol Use No     No Known Allergies  Current Outpatient Medications  Medication Sig Dispense Refill  . acetaminophen (TYLENOL) 500 MG tablet Take 1,000 mg by mouth every 6 (six) hours as needed for mild pain, fever or headache.    Marland Kitchen amLODipine (NORVASC) 5 MG tablet Take 5 mg by mouth daily.    Marland Kitchen aspirin EC 81 MG tablet Take 81 mg by mouth daily.    . carvedilol (COREG) 25 MG tablet Take 1 tablet by mouth 2 (two) times daily. 1/2 tablet    . cetirizine (ZYRTEC) 10 MG tablet Take 10 mg by mouth daily.    . Cholecalciferol (VITAMIN D-1000 MAX ST) 25 MCG (1000 UT) tablet Take 1 tablet by mouth  daily.    . Cholecalciferol (VITAMIN D3) 1.25 MG (50000 UT) CAPS Vitamin D3  Take 5000 IU daily    . Chromium Picolinate 1000 MCG TABS Take 1,000 mg by mouth daily.    Marland Kitchen co-enzyme Q-10 30 MG capsule Take 30 mg by mouth 3 (three) times daily.    . cyclobenzaprine (FLEXERIL) 10 MG tablet Take 10 mg by mouth 3 (three) times daily as needed for muscle spasms.    Marland Kitchen esomeprazole (NEXIUM) 10 MG packet Take 10 mg by mouth daily before breakfast.    . metFORMIN (GLUCOPHAGE) 500 MG tablet Take 500 mg by mouth at bedtime.    . methocarbamol (ROBAXIN) 500 MG tablet Take 1 tablet (500 mg total) by mouth every 6 (six) hours as needed for muscle spasms. (Patient not taking: Reported on 08/31/2018) 21 tablet 0  . metoprolol tartrate (LOPRESSOR) 25 MG tablet Take 25 mg by mouth 2 (two) times daily.    . nitroGLYCERIN (NITROSTAT) 0.4 MG SL tablet Place 1 tablet (0.4 mg total) under the tongue every 5 (five) minutes as needed for chest pain. (Patient not taking: Reported on 08/17/2018) 30 tablet 12  . Omega-3 Fatty Acids (OMEGA 3 PO) Take 1 tablet by mouth every evening. Omega Red    . ONETOUCH ULTRA test strip USE 1 STRIP TO CHECK GLUCOSE THREE TIMES DAILY    . Pitavastatin Calcium (LIVALO) 4 MG TABS Take 4 mg by mouth daily.    Vladimir Faster Glycol-Propyl Glycol (SYSTANE) 0.4-0.3 % SOLN Apply 1 drop to eye daily.    . psyllium (METAMUCIL) 58.6 % packet Take 1 packet by mouth at bedtime as needed.     Marland Kitchen telmisartan (MICARDIS) 40 MG tablet Take 40 mg by mouth daily.    Marland Kitchen triamterene-hydrochlorothiazide (MAXZIDE-25) 37.5-25 MG per tablet Take 1 tablet by mouth daily.    Marland Kitchen zolpidem (AMBIEN) 10 MG tablet Take 10 mg by mouth at bedtime as needed.  No current facility-administered medications for this visit.      Review of Systems:  Constitutional: negative Ears, nose, mouth, throat, and face: negative Respiratory: negative Cardiovascular: negative except for occasional arm pain, and chest  dyscomfort Gastrointestinal: negative Hematologic/lymphatic: negative Musculoskeletal:negative Neurological: negative    PHYSICAL EXAMINATION: There were no vitals taken for this visit. General: non-toxic, appears stated age.   HEENT: NCAT.  Moist mucous membranes, Oropharynx clear.    No cervical or supraclavicular adenopathy. Neuro: Alert, Oriented X 3. Non focal. Psych: appropriate mood, cheerful, and optimistic. Cardiovascular: RRR, no murmur.  Peripherally warm Pulmonary:  Clear B, no clubbing or cyanosis GI: soft, Nondistended MSK: ambulates well.    Diagnostic Studies & Laboratory data:     Recent Radiology Findings:   Ct Chest W Contrast  Result Date: 08/07/2018 CLINICAL DATA:  63 year old female with chest pain and possible spiculated left upper lung nodule on earlier portable chest. EXAM: CT CHEST WITH CONTRAST TECHNIQUE: Multidetector CT imaging of the chest was performed during intravenous contrast administration. CONTRAST:  71mL OMNIPAQUE IOHEXOL 300 MG/ML  SOLN COMPARISON:  Portable chest earlier today. Chest radiographs 02/01/2013. FINDINGS: Cardiovascular: Calcified aortic atherosclerosis. Calcified coronary artery atherosclerosis and/or stents. No cardiomegaly or pericardial effusion. Mediastinum/Nodes: Prevascular mediastinal lymphadenopathy is heterogeneous and about 18 millimeters short axis. There is similar low-density left hilar lymphadenopathy, up to 15 millimeter short axis. Smaller 8 millimeter contralateral right hilar node. No precarinal or other mediastinal lymphadenopathy. Possible small hiatal hernia. Lungs/Pleura: Spiculated left upper lobe lung mass measuring 23 x 28 millimeters (including the spiculations as seen on coronal image 66). Mild regional architectural distortion. In the contralateral right upper lobe there is a smaller subpleural 9 millimeter lung nodule on series 4, image 41. Major airways are patent. No pleural effusion or other abnormal  pulmonary opacity. Upper Abdomen: Negative visible liver, gallbladder, spleen, pancreas, adrenal glands, kidneys, and intra-abdominal stomach. Diverticulosis in the distal transverse colon. Musculoskeletal: Partially visible cervical ACDF. No acute or suspicious osseous lesion identified. IMPRESSION: 1. 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 Malignant appearing left hilar and prevascular mediastinal lymphadenopathy. 2. Smaller, indeterminate contralateral subpleural right upper lobe 9 mm lung nodule. 3. Recommend referral to [facility]). Electronically Signed   By: Genevie Ann M.D.   On: 08/07/2018 17:29   Mr Jeri Cos ZJ Contrast  Result Date: 08/28/2018 CLINICAL DATA:  Possible lung cancer.  Staging. EXAM: MRI HEAD WITHOUT AND WITH CONTRAST TECHNIQUE: Multiplanar, multiecho pulse sequences of the brain and surrounding structures were obtained without and with intravenous contrast. CONTRAST:  8 cc Gadavist intravenous COMPARISON:  None. FINDINGS: Brain: 4 subcentimeter nodular and ring-enhancing abnormalities along the cortex of the right more than left cerebral consistent with metastatic disease in this setting: 1. High and anterior right frontal lobe on 11:69 2. High and lateral right frontal lobe on 11:61 3. High and posterior right frontal lobe on 11:61 4. Left occipital cortex on 11:48 Minimal associated vasogenic edema. Band of signal abnormality in the posterior left cerebellum without underlying enhancement to suggest metastatic disease. No acute hemorrhage, hydrocephalus, or infarct. Vascular: Major flow voids and vascular enhancements are preserved Skull and upper cervical spine: Negative for marrow lesion Sinuses/Orbits: Negative These results will be called to the ordering clinician or representative by the Radiologist Assistant, and communication documented in the PACS or zVision Dashboard. IMPRESSION:  Positive for 4 subcentimeter brain metastases. Electronically Signed   By: Neva Seat.D.  On: 08/28/2018 09:44   Nm Pet Image Initial (pi) Skull Base To Thigh  Result Date: 08/28/2018 CLINICAL DATA:  Initial treatment strategy for lung mass. EXAM: NUCLEAR MEDICINE PET SKULL BASE TO THIGH TECHNIQUE: 9.1 mCi F-18 FDG was injected intravenously. Full-ring PET imaging was performed from the skull base to thigh after the radiotracer. CT data was obtained and used for attenuation correction and anatomic localization. Fasting blood glucose: 100 mg/dl COMPARISON:  Chest CT 08/07/2018 FINDINGS: Mediastinal blood pool activity: SUV max 2.7 NECK: No hypermetabolic lymph nodes in the neck. Incidental CT findings: none CHEST: Left upper lobe pulmonary nodule seen on recent chest CT measures 2.3 cm with SUV max = 12.5. 9 mm peripheral right upper lobe nodule (34/7) is hypermetabolic with SUV max = 2.7. Left hilar lymphadenopathy is hypermetabolic with SUV max = 9.8 although individual lymph nodes are not well demonstrated on this noncontrast exam. Small indeterminate hypermetabolic demonstrates focus in the right hilum SUV max = 4.0. No underlying lymphadenopathy evident in the right hilum by CT. Incidental CT findings: Centrilobular emphsyema noted. Coronary artery calcification is evident. Atherosclerotic calcification is noted in the wall of the thoracic aorta. ABDOMEN/PELVIS: No abnormal hypermetabolic activity within the liver, pancreas, adrenal glands, or spleen. No hypermetabolic lymph nodes in the abdomen or pelvis. Incidental CT findings: There is abdominal aortic atherosclerosis without aneurysm. SKELETON: No focal hypermetabolic activity to suggest skeletal metastasis. Incidental CT findings: Sclerotic changes noted in the pubic bones bilaterally without hypermetabolism. IMPRESSION: 1. Dominant left upper lobe pulmonary nodule is hypermetabolic consistent with primary bronchogenic neoplasm. 2.  Hypermetabolic lymphadenopathy in the left hilum compatible with metastatic involvement. 3. 9 mm right upper lobe pulmonary nodule demonstrates FDG uptake, suspicious for neoplasm. Metastatic disease or synchronous bronchogenic carcinoma would be considerations. 4. Small hypermetabolic focus in the right hilum without underlying lymphadenopathy. Metastatic involvement not excluded. 5.  Aortic Atherosclerois (ICD10-170.0) 6.  Emphysema. ([id number]-G38.9) Electronically Signed   By: Misty Stanley M.D.   On: 08/28/2018 13:51   Dg Chest Port 1 View  Result Date: 08/07/2018 CLINICAL DATA:  Chest pain EXAM: PORTABLE CHEST 1 VIEW COMPARISON:  None. FINDINGS: The heart size and mediastinal contours are within normal limits. There is a spiculated appearing nodule of the left upper lobe measuring approximately 2.6 cm in projection. The visualized skeletal structures are unremarkable. IMPRESSION: 1.  No acute appearing airspace opacity. 2. There is a spiculated appearing nodule of the left upper lobe measuring approximately 2.6 cm in projection, concerning for malignancy. Recommend CT to further evaluate. Electronically Signed   By: Eddie Candle M.D.   On: 08/07/2018 14:55          I have independently reviewed the above radiology studies  and reviewed the findings with the patient.   Recent Lab Findings: Lab Results  Component Value Date   WBC 6.4 08/17/2018   HGB 11.3 (L) 08/17/2018   HCT 37.6 08/17/2018   PLT 350 08/17/2018   GLUCOSE 98 08/17/2018   ALT 13 08/17/2018   AST 22 08/17/2018   NA 141 08/17/2018   K 4.1 08/17/2018   CL 104 08/17/2018   CREATININE 1.00 08/17/2018   BUN 6 (L) 08/17/2018   CO2 28 08/17/2018   INR 0.93 06/14/2013     PFTs: none   Assessment / Plan:    2.8cm left upper lobe pulmonary nodule.  SUV 12.5 0.9cm right upper lobe pulmonary nodule.  SUV 2.7 Left hilar adenopathy. SUV 9.8 Right hilar adenopathy. SUV 4 4 subcentimeter  intra-cranial nodule  63 yo female  with dominant left upper lobe pulmonary nodule, and concern for stage IV lung cancer.  I have recommended that she undergo navigational bronchoscopy to obtain a tissue diagnosis.  Alternative, risks and benefits have been discussed, and she is agreeable to proceed.  She is tentatively scheduled for 09/06/18.     I  spent 30 minutes with  the patient face to face and greater then 50% of the time was spent in counseling and coordination of care.    @ME1 @ 08/31/2018 4:02 PM

## 2018-08-31 NOTE — Progress Notes (Signed)
The proposed treatment discussed in cancer conference 08/31/2018 is for discussion purpose only and is not a binding recommendation.  The patient was not physically examined nor present for their treatment options.  Therefore, final treatment plans cannot be decided.

## 2018-09-01 ENCOUNTER — Other Ambulatory Visit: Payer: Self-pay | Admitting: *Deleted

## 2018-09-01 ENCOUNTER — Encounter: Payer: Self-pay | Admitting: Radiation Oncology

## 2018-09-01 ENCOUNTER — Other Ambulatory Visit: Payer: Self-pay | Admitting: Radiation Oncology

## 2018-09-01 ENCOUNTER — Encounter: Payer: Self-pay | Admitting: *Deleted

## 2018-09-01 DIAGNOSIS — C7931 Secondary malignant neoplasm of brain: Secondary | ICD-10-CM

## 2018-09-01 DIAGNOSIS — Z0000 Encounter for general adult medical examination without abnormal findings: Secondary | ICD-10-CM | POA: Diagnosis not present

## 2018-09-01 DIAGNOSIS — I10 Essential (primary) hypertension: Secondary | ICD-10-CM | POA: Diagnosis not present

## 2018-09-01 DIAGNOSIS — R911 Solitary pulmonary nodule: Secondary | ICD-10-CM

## 2018-09-01 DIAGNOSIS — I2510 Atherosclerotic heart disease of native coronary artery without angina pectoris: Secondary | ICD-10-CM | POA: Diagnosis not present

## 2018-09-01 DIAGNOSIS — E119 Type 2 diabetes mellitus without complications: Secondary | ICD-10-CM | POA: Diagnosis not present

## 2018-09-01 NOTE — Progress Notes (Signed)
Oncology Nurse Navigator Documentation  Oncology Nurse Navigator Flowsheets 09/01/2018  Navigator Follow Up Date: 09/04/2018  Navigator Follow Up Reason: Review Note  Navigator Location [facility]  Referral Date to RadOnc/MedOnc -  Navigator Encounter Type Clinic/MDC/I spoke with patient today at thoracic clinic.  I help to explain her treatment plan.  She will be scheduled with Dr. Kipp Brood for bronchoscopy.  I called thoracic office and spoke Thurmond Butts about her treatment plan.  I also faxed white sheets to their office.  She was thankful for the update.   Telephone -  Abnormal Finding Date 08/07/2018  Multidisiplinary Clinic Date 08/31/2018  Patient Visit Type MedOnc  Treatment Phase Abnormal Scans  Barriers/Navigation Needs Coordination of Care;Education  Education Other  Interventions Coordination of Care;Education  Coordination of Care -  Education Method Verbal  Acuity Level 2  Time Spent with Patient 30

## 2018-09-04 ENCOUNTER — Other Ambulatory Visit: Payer: Self-pay

## 2018-09-04 ENCOUNTER — Encounter (HOSPITAL_COMMUNITY): Payer: Self-pay

## 2018-09-04 ENCOUNTER — Encounter: Payer: Self-pay | Admitting: *Deleted

## 2018-09-04 ENCOUNTER — Ambulatory Visit (HOSPITAL_COMMUNITY)
Admission: RE | Admit: 2018-09-04 | Discharge: 2018-09-04 | Disposition: A | Discharge status: Home / Self Care | Payer: Medicare Other | Source: Ambulatory Visit | Attending: Thoracic Surgery (Cardiothoracic Vascular Surgery) | Admitting: Thoracic Surgery (Cardiothoracic Vascular Surgery)

## 2018-09-04 ENCOUNTER — Telehealth: Payer: Self-pay | Admitting: Internal Medicine

## 2018-09-04 ENCOUNTER — Other Ambulatory Visit (HOSPITAL_COMMUNITY)
Admission: RE | Admit: 2018-09-04 | Discharge: 2018-09-04 | Disposition: A | Discharge status: Home / Self Care | Payer: Medicare Other | Source: Ambulatory Visit | Attending: Thoracic Surgery (Cardiothoracic Vascular Surgery) | Admitting: Thoracic Surgery (Cardiothoracic Vascular Surgery)

## 2018-09-04 DIAGNOSIS — Z955 Presence of coronary angioplasty implant and graft: Secondary | ICD-10-CM | POA: Insufficient documentation

## 2018-09-04 DIAGNOSIS — K219 Gastro-esophageal reflux disease without esophagitis: Secondary | ICD-10-CM | POA: Insufficient documentation

## 2018-09-04 DIAGNOSIS — I2510 Atherosclerotic heart disease of native coronary artery without angina pectoris: Secondary | ICD-10-CM | POA: Insufficient documentation

## 2018-09-04 DIAGNOSIS — Z87891 Personal history of nicotine dependence: Secondary | ICD-10-CM | POA: Insufficient documentation

## 2018-09-04 DIAGNOSIS — Z7984 Long term (current) use of oral hypoglycemic drugs: Secondary | ICD-10-CM | POA: Diagnosis not present

## 2018-09-04 DIAGNOSIS — R911 Solitary pulmonary nodule: Secondary | ICD-10-CM | POA: Insufficient documentation

## 2018-09-04 DIAGNOSIS — Z981 Arthrodesis status: Secondary | ICD-10-CM | POA: Diagnosis not present

## 2018-09-04 DIAGNOSIS — I10 Essential (primary) hypertension: Secondary | ICD-10-CM | POA: Insufficient documentation

## 2018-09-04 DIAGNOSIS — E119 Type 2 diabetes mellitus without complications: Secondary | ICD-10-CM | POA: Diagnosis not present

## 2018-09-04 DIAGNOSIS — Z01818 Encounter for other preprocedural examination: Secondary | ICD-10-CM | POA: Insufficient documentation

## 2018-09-04 DIAGNOSIS — Z79899 Other long term (current) drug therapy: Secondary | ICD-10-CM | POA: Insufficient documentation

## 2018-09-04 DIAGNOSIS — J45909 Unspecified asthma, uncomplicated: Secondary | ICD-10-CM | POA: Diagnosis not present

## 2018-09-04 DIAGNOSIS — Z1159 Encounter for screening for other viral diseases: Secondary | ICD-10-CM | POA: Diagnosis not present

## 2018-09-04 LAB — CBC
HCT: 40.2 % (ref 36.0–46.0)
Hemoglobin: 11.9 g/dL — ABNORMAL LOW (ref 12.0–15.0)
MCH: 23.3 pg — ABNORMAL LOW (ref 26.0–34.0)
MCHC: 29.6 g/dL — ABNORMAL LOW (ref 30.0–36.0)
MCV: 78.8 fL — ABNORMAL LOW (ref 80.0–100.0)
Platelets: 382 10*3/uL (ref 150–400)
RBC: 5.1 MIL/uL (ref 3.87–5.11)
RDW: 16.1 % — ABNORMAL HIGH (ref 11.5–15.5)
WBC: 7.6 10*3/uL (ref 4.0–10.5)
nRBC: 0 % (ref 0.0–0.2)

## 2018-09-04 LAB — COMPREHENSIVE METABOLIC PANEL
ALT: 14 U/L (ref 0–44)
AST: 21 U/L (ref 15–41)
Albumin: 3.8 g/dL (ref 3.5–5.0)
Alkaline Phosphatase: 77 U/L (ref 38–126)
Anion gap: 12 (ref 5–15)
BUN: 10 mg/dL (ref 8–23)
CO2: 26 mmol/L (ref 22–32)
Calcium: 9.4 mg/dL (ref 8.9–10.3)
Chloride: 102 mmol/L (ref 98–111)
Creatinine, Ser: 0.92 mg/dL (ref 0.44–1.00)
GFR calc Af Amer: >60 mL/min (ref >60)
GFR calc non Af Amer: >60 mL/min (ref >60)
Glucose, Bld: 101 mg/dL — ABNORMAL HIGH (ref 70–99)
Potassium: 4 mmol/L (ref 3.5–5.1)
Sodium: 140 mmol/L (ref 135–145)
Total Bilirubin: 0.4 mg/dL (ref 0.3–1.2)
Total Protein: 7.9 g/dL (ref 6.5–8.1)

## 2018-09-04 LAB — PROTIME-INR
INR: 1 (ref 0.8–1.2)
Prothrombin Time: 12.9 seconds (ref 11.4–15.2)

## 2018-09-04 LAB — HEMOGLOBIN A1C
Hgb A1c MFr Bld: 6.6 % — ABNORMAL HIGH (ref 4.8–5.6)
Mean Plasma Glucose: 142.72 mg/dL

## 2018-09-04 LAB — GLUCOSE, CAPILLARY: Glucose-Capillary: 89 mg/dL (ref 70–99)

## 2018-09-04 LAB — APTT: aPTT: 38 seconds — ABNORMAL HIGH (ref 24–36)

## 2018-09-04 NOTE — Telephone Encounter (Signed)
Scheduled appt per 7/27 sch message - pt aware of appt date and time

## 2018-09-04 NOTE — Progress Notes (Signed)
Oncology Nurse Navigator Documentation  Oncology Nurse Navigator Flowsheets 09/04/2018  Navigator Follow Up Date: 09/11/2018  Navigator Follow Up Reason: Follow-up Appointment  Navigator Location [facility]  Referral Date to RadOnc/MedOnc -  Navigator Encounter Type Other/I followed up on Carolyn [name]'s appts.  She needs MRI brain with authorization.  I contacted authorization coordinator for assistance.    Telephone -  Abnormal Finding Date -  Confirmed Diagnosis Date 09/06/2018  Multidisiplinary Clinic Date -  Patient Visit Type -  Treatment Phase Abnormal Scans  Barriers/Navigation Needs Coordination of Care  Education Other  Interventions Coordination of Care  Coordination of Care Other  Education Method -  Acuity Level 2  Time Spent with Patient 15

## 2018-09-04 NOTE — Progress Notes (Signed)
  WHAT DO I DO ABOUT MY DIABETES MEDICATION?   Carolyn Kitchen Do not take oral diabetes medicines (pills) the morning of surgery. Metformin   How to Manage Your Diabetes Before and After Surgery  Why is it important to control my blood sugar before and after surgery? . Improving blood sugar levels before and after surgery helps healing and can limit problems. . A way of improving blood sugar control is eating a healthy diet by: o  Eating less sugar and carbohydrates o  Increasing activity/exercise o  Talking with your doctor about reaching your blood sugar goals . High blood sugars (greater than 180 mg/dL) can raise your risk of infections and slow your recovery, so you will need to focus on controlling your diabetes during the weeks before surgery. . Make sure that the doctor who takes care of your diabetes knows about your planned surgery including the date and location.  How do I manage my blood sugar before surgery? . Check your blood sugar at least 4 times a day, starting 2 days before surgery, to make sure that the level is not too high or low. o Check your blood sugar the morning of your surgery when you wake up and every 2 hours until you get to the Short Stay unit. . If your blood sugar is less than 70 mg/dL, you will need to treat for low blood sugar: o Do not take insulin. o Treat a low blood sugar (less than 70 mg/dL) with  cup of clear juice (cranberry or apple), 4 glucose tablets, OR glucose gel. o Recheck blood sugar in 15 minutes after treatment (to make sure it is greater than 70 mg/dL). If your blood sugar is not greater than 70 mg/dL on recheck, call [phone] for further instructions. . Report your blood sugar to the short stay nurse when you get to Short Stay.  . If you are admitted to the hospital after surgery: o Your blood sugar will be checked by the staff and you will probably be given insulin after surgery (instead of oral diabetes medicines) to make sure you have good  blood sugar levels. o The goal for blood sugar control after surgery is 80-180 mg/dL.

## 2018-09-04 NOTE — Progress Notes (Signed)
PCP - Jonne Ply  Cardiologist - Ganji  Chest x-ray - 7/227/20 EKG - 08/07/18 Stress Test - 2014 - requesting ECHO - requesting Cardiac Cath - 2014  Fasting Blood Sugar - 110-120s Checks Blood Sugar __1___ times a day   Aspirin Instructions: continue but do not take the morning of   Anesthesia review: yes  Patient denies shortness of breath, fever, cough and chest pain at PAT appointment   Patient verbalized understanding of instructions that were given to them at the PAT appointment. Patient was also instructed that they will need to review over the PAT instructions again at home before surgery.

## 2018-09-04 NOTE — Progress Notes (Signed)
[place], [place] - [id number] [place] [address] Phone: [phone] Fax: [phone]      Your procedure is scheduled on July 29  Report to [facility] Main Entrance "A" at 0930 A.M., and check in at the Admitting office.  Call this number if you have problems the morning of surgery:  [phone]  Call [phone] if you have any questions prior to your surgery date Monday-Friday 8am-4pm    Remember:  Do not eat or drink after midnight the night before your surgery     Take these medicines the morning of surgery with A SIP OF WATER  acetaminophen (TYLENOL)  If needed carvedilol (COREG) cetirizine (ZYRTEC) methocarbamol (ROBAXIN)   Follow your surgeon's instructions on when to stop Aspirin.  If no instructions were given by your surgeon then you will need to call the office to get those instructions.    7 days prior to surgery STOP taking any Aspirin (unless otherwise instructed by your surgeon), Aleve, Naproxen, Ibuprofen, Motrin, Advil, Goody's, BC's, all herbal medications, fish oil, and all vitamins.    The Morning of Surgery  Do not wear jewelry, make-up or nail polish.  Do not wear lotions, powders, or perfumes, or deodorant  Do not shave 48 hours prior to surgery.    Do not bring valuables to the hospital.  [facility] is not responsible for any belongings or valuables.  If you are a smoker, DO NOT Smoke 24 hours prior to surgery IF you wear a CPAP at night please bring your mask, tubing, and machine the morning of surgery   Remember that you must have someone to transport you home after your surgery, and remain with you for 24 hours if you are discharged the same day.   Contacts, glasses, hearing aids, dentures or bridgework may not be worn into surgery.    Leave your suitcase in the car.  After surgery it may be brought to your room.  For patients admitted to the hospital, discharge time will be  determined by your treatment team.  Patients discharged the day of surgery will not be allowed to drive home.    Special instructions:   [facility]- Preparing For Surgery  Before surgery, you can play an important role. Because skin is not sterile, your skin needs to be as free of germs as possible. You can reduce the number of germs on your skin by washing with CHG (chlorahexidine gluconate) Soap before surgery.  CHG is an antiseptic cleaner which kills germs and bonds with the skin to continue killing germs even after washing.    Oral Hygiene is also important to reduce your risk of infection.  Remember - BRUSH YOUR TEETH THE MORNING OF SURGERY WITH YOUR REGULAR TOOTHPASTE  Please do not use if you have an allergy to CHG or antibacterial soaps. If your skin becomes reddened/irritated stop using the CHG.  Do not shave (including legs and underarms) for at least 48 hours prior to first CHG shower. It is OK to shave your face.  Please follow these instructions carefully.   1. Shower the NIGHT BEFORE SURGERY and the MORNING OF SURGERY with CHG Soap.   2. If you chose to wash your hair, wash your hair first as usual with your normal shampoo.  3. After you shampoo, rinse your hair and body thoroughly to remove the shampoo.  4. Use CHG as you would any other liquid soap. You can apply CHG  directly to the skin and wash gently with a scrungie or a clean washcloth.   5. Apply the CHG Soap to your body ONLY FROM THE NECK DOWN.  Do not use on open wounds or open sores. Avoid contact with your eyes, ears, mouth and genitals (private parts). Wash Face and genitals (private parts)  with your normal soap.   6. Wash thoroughly, paying special attention to the area where your surgery will be performed.  7. Thoroughly rinse your body with warm water from the neck down.  8. DO NOT shower/wash with your normal soap after using and rinsing off the CHG Soap.  9. Pat yourself dry with a CLEAN  TOWEL.  10. Wear CLEAN PAJAMAS to bed the night before surgery, wear comfortable clothes the morning of surgery  11. Place CLEAN SHEETS on your bed the night of your first shower and DO NOT SLEEP WITH PETS.    Day of Surgery:  Do not apply any deodorants/lotions. Please shower the morning of surgery with the CHG soap  Please wear clean clothes to the hospital/surgery center.   Remember to brush your teeth WITH YOUR REGULAR TOOTHPASTE.   Please read over the following fact sheets that you were given.

## 2018-09-04 NOTE — Progress Notes (Signed)
Patient does not need mri brain as of now per brain team.

## 2018-09-05 LAB — SARS CORONAVIRUS 2 (TAT 6-24 HRS): SARS Coronavirus 2: NEGATIVE

## 2018-09-05 NOTE — Anesthesia Preprocedure Evaluation (Addendum)
Anesthesia Evaluation  Patient identified by MRN, date of birth, ID band Patient awake    Reviewed: Allergy & Precautions, NPO status , Patient's Chart, lab work & pertinent test results  Airway Mallampati: II  TM Distance: >3 FB Neck ROM: Full    Dental  (+) Teeth Intact, Dental Advisory Given   Pulmonary former smoker,    breath sounds clear to auscultation       Cardiovascular hypertension,  Rhythm:Regular Rate:Normal     Neuro/Psych    GI/Hepatic   Endo/Other  diabetes  Renal/GU      Musculoskeletal   Abdominal   Peds  Hematology   Anesthesia Other Findings   Reproductive/Obstetrics                            Anesthesia Physical Anesthesia Plan  ASA: III  Anesthesia Plan: General   Post-op Pain Management:    Induction: Intravenous  PONV Risk Score and Plan: Ondansetron  Airway Management Planned: Oral ETT  Additional Equipment:   Intra-op Plan:   Post-operative Plan: Extubation in OR  Informed Consent: I have reviewed the patients History and Physical, chart, labs and discussed the procedure including the risks, benefits and alternatives for the proposed anesthesia with the patient or authorized representative who has indicated his/her understanding and acceptance.     Dental advisory given  Plan Discussed with: CRNA and Anesthesiologist  Anesthesia Plan Comments: (PAT note written 09/05/2018 by Myra Gianotti, PA-C. )       Anesthesia Quick Evaluation

## 2018-09-05 NOTE — Progress Notes (Addendum)
Anesthesia Chart Review:  Case: [id number] Date/Time: 09/06/18 1115   Procedures:      VIDEO BRONCHOSCOPY WITH ENDOBRONCHIAL ULTRASOUND (N/A )     VIDEO BRONCHOSCOPY WITH ENDOBRONCHIAL NAVIGATION (N/A )   Anesthesia type: General   Pre-op diagnosis: LUL LUNG NODULE   Location: [facility] OR ROOM 10 / [facility]   Surgeon: Lajuana Matte, MD      DISCUSSION: Patient is a 63 year old female scheduled for the above procedure. On 6/29/290, a spiculated LUL lung mass concerning for bronchogenic carcinoma noted on CT for work-up of chest pain. (Chest pain "not likely of cardiac d/t presentation" per ED provider; Troponin I high sensitivity was WNL x2. Referred to oncology for lung mass.) There was also left hilar and mediastinal lymphadenopathy and indeterminate RUL nodules. Nodules hypermetabolic and intracranial nodule noted on MRI concerning for stage IV lung cancer. The above procedure recommended for definitive tissue diagnosis.  History includes former smoker (quit 2001), CAD (s/p Veriflex non-DES mid LAD 06/13/12), murmur, HTN, GERD, hiatal hernia, DM2, asthma, C3-5 ACDF 07/14/16. Exertional angina history was at the time of her LAD stent. BMI is consistent with obesity.  She was last seen by cardiologist Dr. Einar Gip on 02/22/18.  BP better controlled with increase amlodipine.  She had left shoulder pain, but no no recurrence of angina since 2014. She was walking close to 10,000 steps per day at that time. One year follow-up recommended.   Presurgical COVID-19 test from 09/04/18 was negative.  She denied any shortness of breath, cough, fever, chest pain at her PAT RN visit.  Based on currently available information I would anticipate that she could proceed as planned if no acute changes.   VS: BP (!) 156/82   Pulse (!) 57   Temp (!) 36.2 C (Oral)   Resp 18   Ht 5' 0.5" (1.537 m)   Wt 80.4 kg   SpO2 100%   BMI 34.03 kg/m     PROVIDERS: [name], MD is PCP  Adrian Prows, MD is cardiologist at [facility].  Curt Bears, MD is HEM-ONC Eppie Gibson, MD is RAD-ONC   LABS: Labs reviewed: Acceptable for surgery. Labs from 09/04/18 include:  Lab Results  Component Value Date   WBC 7.6 09/04/2018   HGB 11.9 (L) 09/04/2018   HCT 40.2 09/04/2018   PLT 382 09/04/2018   GLUCOSE 101 (H) 09/04/2018   ALT 14 09/04/2018   AST 21 09/04/2018   NA 140 09/04/2018   K 4.0 09/04/2018   CL 102 09/04/2018   CREATININE 0.92 09/04/2018   BUN 10 09/04/2018   CO2 26 09/04/2018   INR 1.0 09/04/2018   HGBA1C 6.6 (H) 09/04/2018     IMAGES: CXR 09/04/18: IMPRESSION: Stable 3 cm spiculated left upper lobe pulmonary nodule, highly suspicious for bronchogenic carcinoma.  MRI Brain 08/28/18: IMPRESSION: Positive for 4 subcentimeter brain metastases.  PET Scan 08/28/18: IMPRESSION: 1. Dominant left upper lobe pulmonary nodule is hypermetabolic consistent with primary bronchogenic neoplasm. 2. Hypermetabolic lymphadenopathy in the left hilum compatible with metastatic involvement. 3. 9 mm right upper lobe pulmonary nodule demonstrates FDG uptake, suspicious for neoplasm. Metastatic disease or synchronous bronchogenic carcinoma would be considerations. 4. Small hypermetabolic focus in the right hilum without underlying lymphadenopathy. Metastatic involvement not excluded. 5.  Aortic Atherosclerois (ICD10-170.0) 6.  Emphysema. ([id number]-Q33.9)  US Thyroid 07/25/17: IMPRESSION: Unremarkable sonographic survey of the thyroid   EKG: 08/07/18: Sinus rhythm Nonspecific T abnormalities, diffuse leads no significant change since 2019 Confirmed  by Sherwood Gambler [phone]) on 08/07/2018 2:42:42 PM - V3 is more flat/negative when compared to 01/18/18 tracing from [facility], otherwise I believe EKG findings appear stable.    CV: Echo 07/10/12 [facility] CV): Conclusion: 1. Left ventricular cavity is normal in size.  Mild concentric hypertrophy.  Normal global wall motion.   Normal systolic global function.  Calculated EF 66%.  Doppler evidence of grade 1 (impaired) diastolic dysfunction with elevated LV filling pressure.   2. Left atrial cavity is mildly dilated." 3.  Mitral valve structurally normal.  Trace mitral regurgitation.  Mitral valve inflow A > E ratio. 4.  Normal trileaflet aortic valve with normal systolic flow pattern with no regurgitation noted. 5.  Tricuspid valve structurally normal.  Trace tricuspid regurgitation.  Cardiac cath 06/13/12 (for chest pain, abnormal ETT): Hemodynamic data: Left ventricular pressure was 116/6 with LVEDP of 11 mm mercury. Aortic pressure was 117/68 with a mean of 89 mm mercury. There was no pressure gradient across the aortic valve.  Left ventricle: Performed in the RAO projection revealed LVEF of 60%. There was No MR. No wall motion abnormality. Right coronary artery: The vessel is large, mild luminal irregularities. C-Dominant with Circumflex coronary artery. Left main coronary artery is large and normal. Circumflex coronary artery: A large vessel giving origin to a large obtuse marginal 1.  LAD:  LAD gives origin to several small diagonals. The diagonals the is a small to moderate size vessel. The LAD after the origin of first septal perforator and between small to moderate size the 3 has a 99% stenosis. Mid to distal LAD has mild luminal irregularities.  Impression: High-grade stenosis of the mid LAD, 99% stenosis. Interventional data: Successful PTCA and stenting of the 4.0 x 24 mm Veriflex stent to the mid LAD with reduction of stenosis from 99% to  0% with brisk TIMI-3 to TIMI-3 flow maintained in the procedure. Will need Dual antiplatelet therapy with Effient and ASA 81 mg for at least 3 months, probably 9-12 months. Then aspirin indefinitely. Kela Millin, MD)   Past Medical History:  Diagnosis Date  . Anginal pain ([place])    " with exertion "  . Arthritis    " 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 "  . Asthma    h/o  . Coronary  artery disease   . Diabetes mellitus without complication ([place])    Type II  . GERD (gastroesophageal reflux disease)   . H/O hiatal hernia   . Heart murmur   . 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 Hypertension   . Persistent headaches    h/o  . Pneumonia    hx of PNA  . Shortness of breath   . Vaginal dryness    h/o    Past Surgical History:  Procedure Laterality Date  . ABDOMINAL HYSTERECTOMY    . bone spur    . CARDIAC CATHETERIZATION  06/13/2012  . carpel tunnel surgery Left   . COLONOSCOPY     9 years ago  . CORONARY ANGIOPLASTY  06/13/2012  . EYE SURGERY     cyst left eye   . LEFT HEART CATHETERIZATION WITH CORONARY ANGIOGRAM N/A 06/13/2012   Procedure: LEFT HEART CATHETERIZATION WITH CORONARY ANGIOGRAM;  Surgeon: Laverda Page, MD;  Location: [facility] CATH LAB;  Service: Cardiovascular;  Laterality: N/A;  . NECK SURGERY    . rotator cuff surgery Right 2015  . WISDOM TOOTH EXTRACTION      MEDICATIONS: . acetaminophen (TYLENOL) 500 MG tablet  .  amLODipine (NORVASC) 5 MG tablet  . aspirin EC 81 MG tablet  . carvedilol (COREG) 25 MG tablet  . cetirizine (ZYRTEC) 10 MG tablet  . Cholecalciferol (VITAMIN D-3) 125 MCG (5000 UT) TABS  . Chromium Picolinate 1000 MCG TABS  . co-enzyme Q-10 30 MG capsule  . esomeprazole (NEXIUM) 20 MG capsule  . ferrous sulfate 325 (65 FE) MG tablet  . Ginger, Zingiber officinalis, (GINGER ROOT) 500 MG CAPS  . hydrochlorothiazide (MICROZIDE) 12.5 MG capsule  . Krill Oil 1000 MG CAPS  . metFORMIN (GLUCOPHAGE) 500 MG tablet  . methocarbamol (ROBAXIN) 500 MG tablet  . nitroGLYCERIN (NITROSTAT) 0.4 MG SL tablet  . ONETOUCH ULTRA test strip  . Pitavastatin Calcium (LIVALO) 4 MG TABS  . Polyethyl Glycol-Propyl Glycol (SYSTANE) 0.4-0.3 % SOLN  . psyllium (METAMUCIL) 58.6 % packet  . telmisartan (MICARDIS) 40 MG tablet  . Turmeric 500 MG CAPS  . zolpidem (AMBIEN) 10 MG tablet   No current facility-administered medications for this encounter.      Myra Gianotti, PA-C Surgical Short Stay/Anesthesiology [facility] Phone [phone] [facility] Phone [phone] 09/05/2018 3:28 PM

## 2018-09-06 ENCOUNTER — Encounter (HOSPITAL_COMMUNITY): Payer: Self-pay

## 2018-09-06 ENCOUNTER — Ambulatory Visit (HOSPITAL_COMMUNITY): Payer: Medicare Other | Admitting: Certified Registered"

## 2018-09-06 ENCOUNTER — Ambulatory Visit (HOSPITAL_COMMUNITY): Payer: Medicare Other

## 2018-09-06 ENCOUNTER — Encounter

## 2018-09-06 ENCOUNTER — Ambulatory Visit (HOSPITAL_COMMUNITY)
Admission: RE | Admit: 2018-09-06 | Discharge: 2018-09-06 | Disposition: A | Discharge status: Home / Self Care | Payer: Medicare Other | Attending: Thoracic Surgery (Cardiothoracic Vascular Surgery) | Admitting: Thoracic Surgery (Cardiothoracic Vascular Surgery)

## 2018-09-06 ENCOUNTER — Ambulatory Visit (HOSPITAL_COMMUNITY): Payer: Medicare Other | Admitting: Physician Assistant

## 2018-09-06 ENCOUNTER — Other Ambulatory Visit: Payer: Self-pay

## 2018-09-06 ENCOUNTER — Encounter (HOSPITAL_COMMUNITY)
Admission: RE | Disposition: A | Discharge status: Home / Self Care | Payer: Self-pay | Source: Home / Self Care | Attending: Thoracic Surgery (Cardiothoracic Vascular Surgery)

## 2018-09-06 DIAGNOSIS — I25119 Atherosclerotic heart disease of native coronary artery with unspecified angina pectoris: Secondary | ICD-10-CM | POA: Diagnosis not present

## 2018-09-06 DIAGNOSIS — Z419 Encounter for procedure for purposes other than remedying health state, unspecified: Secondary | ICD-10-CM

## 2018-09-06 DIAGNOSIS — I10 Essential (primary) hypertension: Secondary | ICD-10-CM | POA: Insufficient documentation

## 2018-09-06 DIAGNOSIS — E785 Hyperlipidemia, unspecified: Secondary | ICD-10-CM | POA: Diagnosis not present

## 2018-09-06 DIAGNOSIS — Z7984 Long term (current) use of oral hypoglycemic drugs: Secondary | ICD-10-CM | POA: Diagnosis not present

## 2018-09-06 DIAGNOSIS — R918 Other nonspecific abnormal finding of lung field: Secondary | ICD-10-CM | POA: Diagnosis not present

## 2018-09-06 DIAGNOSIS — K219 Gastro-esophageal reflux disease without esophagitis: Secondary | ICD-10-CM | POA: Insufficient documentation

## 2018-09-06 DIAGNOSIS — Z79899 Other long term (current) drug therapy: Secondary | ICD-10-CM | POA: Insufficient documentation

## 2018-09-06 DIAGNOSIS — M1990 Unspecified osteoarthritis, unspecified site: Secondary | ICD-10-CM | POA: Insufficient documentation

## 2018-09-06 DIAGNOSIS — C3492 Malignant neoplasm of unspecified part of left bronchus or lung: Secondary | ICD-10-CM | POA: Diagnosis not present

## 2018-09-06 DIAGNOSIS — E119 Type 2 diabetes mellitus without complications: Secondary | ICD-10-CM | POA: Insufficient documentation

## 2018-09-06 DIAGNOSIS — Z87891 Personal history of nicotine dependence: Secondary | ICD-10-CM | POA: Diagnosis not present

## 2018-09-06 DIAGNOSIS — R911 Solitary pulmonary nodule: Secondary | ICD-10-CM | POA: Diagnosis not present

## 2018-09-06 DIAGNOSIS — C3412 Malignant neoplasm of upper lobe, left bronchus or lung: Secondary | ICD-10-CM | POA: Diagnosis not present

## 2018-09-06 DIAGNOSIS — I2510 Atherosclerotic heart disease of native coronary artery without angina pectoris: Secondary | ICD-10-CM | POA: Diagnosis not present

## 2018-09-06 DIAGNOSIS — Z7982 Long term (current) use of aspirin: Secondary | ICD-10-CM | POA: Insufficient documentation

## 2018-09-06 HISTORY — PX: VIDEO BRONCHOSCOPY WITH ENDOBRONCHIAL ULTRASOUND: SHX6177

## 2018-09-06 HISTORY — PX: VIDEO BRONCHOSCOPY WITH ENDOBRONCHIAL NAVIGATION: SHX6175

## 2018-09-06 LAB — GLUCOSE, CAPILLARY
Glucose-Capillary: 91 mg/dL (ref 70–99)
Glucose-Capillary: 88 mg/dL (ref 70–99)
Glucose-Capillary: 90 mg/dL (ref 70–99)

## 2018-09-06 SURGERY — BRONCHOSCOPY, WITH EBUS
Anesthesia: General | Site: Chest

## 2018-09-06 MED ORDER — FENTANYL CITRATE (PF) 100 MCG/2ML IJ SOLN
INTRAMUSCULAR | Status: DC | PRN
  Administered 2018-09-06 (×2): 50 ug via INTRAVENOUS

## 2018-09-06 MED ORDER — LACTATED RINGERS IV SOLN
INTRAVENOUS | Status: DC
  Administered 2018-09-06: 10:00:00 via INTRAVENOUS

## 2018-09-06 MED ORDER — OXYCODONE HCL 5 MG PO TABS: 5.0000 mg | ORAL_TABLET | Freq: Once | ORAL | Status: DC | PRN

## 2018-09-06 MED ORDER — LIDOCAINE 2% (20 MG/ML) 5 ML SYRINGE
INTRAMUSCULAR | Status: DC | PRN
  Administered 2018-09-06: 40 mg via INTRAVENOUS

## 2018-09-06 MED ORDER — ONDANSETRON HCL 4 MG/2ML IJ SOLN: 4.0000 mg | Freq: Once | INTRAMUSCULAR | Status: DC | PRN

## 2018-09-06 MED ORDER — DEXAMETHASONE SODIUM PHOSPHATE 10 MG/ML IJ SOLN
INTRAMUSCULAR | Status: AC
  Filled 2018-09-06: qty 1

## 2018-09-06 MED ORDER — ROCURONIUM BROMIDE 10 MG/ML (PF) SYRINGE
PREFILLED_SYRINGE | INTRAVENOUS | Status: AC
  Filled 2018-09-06: qty 10

## 2018-09-06 MED ORDER — FENTANYL CITRATE (PF) 100 MCG/2ML IJ SOLN: 25.0000 ug | INTRAMUSCULAR | Status: DC | PRN

## 2018-09-06 MED ORDER — FENTANYL CITRATE (PF) 250 MCG/5ML IJ SOLN
INTRAMUSCULAR | Status: AC
  Filled 2018-09-06: qty 5

## 2018-09-06 MED ORDER — PHENYLEPHRINE 40 MCG/ML (10ML) SYRINGE FOR IV PUSH (FOR BLOOD PRESSURE SUPPORT)
PREFILLED_SYRINGE | INTRAVENOUS | Status: DC | PRN
  Administered 2018-09-06 (×2): 80 ug via INTRAVENOUS

## 2018-09-06 MED ORDER — ONDANSETRON HCL 4 MG/2ML IJ SOLN
INTRAMUSCULAR | Status: AC
  Filled 2018-09-06: qty 2

## 2018-09-06 MED ORDER — EPHEDRINE 5 MG/ML INJ
INTRAVENOUS | Status: AC
  Filled 2018-09-06: qty 10

## 2018-09-06 MED ORDER — ONDANSETRON HCL 4 MG/2ML IJ SOLN
INTRAMUSCULAR | Status: DC | PRN
  Administered 2018-09-06: 4 mg via INTRAVENOUS

## 2018-09-06 MED ORDER — 0.9 % SODIUM CHLORIDE (POUR BTL) OPTIME
TOPICAL | Status: DC | PRN
  Administered 2018-09-06: 1000 mL

## 2018-09-06 MED ORDER — MIDAZOLAM HCL 2 MG/2ML IJ SOLN
INTRAMUSCULAR | Status: DC | PRN
  Administered 2018-09-06: 2 mg via INTRAVENOUS

## 2018-09-06 MED ORDER — SODIUM CHLORIDE 0.9 % IV SOLN
INTRAVENOUS | Status: DC | PRN
  Administered 2018-09-06: 25 ug/min via INTRAVENOUS

## 2018-09-06 MED ORDER — SUGAMMADEX SODIUM 200 MG/2ML IV SOLN
INTRAVENOUS | Status: DC | PRN
  Administered 2018-09-06: 160 mg via INTRAVENOUS

## 2018-09-06 MED ORDER — DEXAMETHASONE SODIUM PHOSPHATE 10 MG/ML IJ SOLN
INTRAMUSCULAR | Status: DC | PRN
  Administered 2018-09-06: 10 mg via INTRAVENOUS

## 2018-09-06 MED ORDER — PHENYLEPHRINE 40 MCG/ML (10ML) SYRINGE FOR IV PUSH (FOR BLOOD PRESSURE SUPPORT)
PREFILLED_SYRINGE | INTRAVENOUS | Status: AC
  Filled 2018-09-06: qty 10

## 2018-09-06 MED ORDER — LIDOCAINE 2% (20 MG/ML) 5 ML SYRINGE
INTRAMUSCULAR | Status: AC
  Filled 2018-09-06: qty 5

## 2018-09-06 MED ORDER — OXYCODONE HCL 5 MG/5ML PO SOLN: 5.0000 mg | Freq: Once | ORAL | Status: DC | PRN

## 2018-09-06 MED ORDER — PROPOFOL 10 MG/ML IV BOLUS
INTRAVENOUS | Status: DC | PRN
  Administered 2018-09-06: 140 mg via INTRAVENOUS

## 2018-09-06 MED ORDER — EPHEDRINE SULFATE-NACL 50-0.9 MG/10ML-% IV SOSY
PREFILLED_SYRINGE | INTRAVENOUS | Status: DC | PRN
  Administered 2018-09-06: 5 mg via INTRAVENOUS

## 2018-09-06 MED ORDER — ROCURONIUM BROMIDE 10 MG/ML (PF) SYRINGE
PREFILLED_SYRINGE | INTRAVENOUS | Status: DC | PRN
  Administered 2018-09-06: 20 mg via INTRAVENOUS
  Administered 2018-09-06: 50 mg via INTRAVENOUS

## 2018-09-06 MED ORDER — MIDAZOLAM HCL 2 MG/2ML IJ SOLN
INTRAMUSCULAR | Status: AC
  Filled 2018-09-06: qty 2

## 2018-09-06 MED ORDER — PROPOFOL 10 MG/ML IV BOLUS
INTRAVENOUS | Status: AC
  Filled 2018-09-06: qty 20

## 2018-09-06 SURGICAL SUPPLY — 59 items
ADAPTER BRONCHOSCOPE OLYMPUS (ADAPTER) ×4 IMPLANT
ADAPTER VALVE BIOPSY EBUS (MISCELLANEOUS) IMPLANT
ADPR BSCP OLMPS EDG (ADAPTER) ×2
ADPTR VALVE BIOPSY EBUS (MISCELLANEOUS)
BRUSH BIOPSY BRONCH 10 SDTNB (MISCELLANEOUS) IMPLANT
BRUSH BIOPSY BRONCH 10MM SDTNB (MISCELLANEOUS)
BRUSH CYTOL CELLEBRITY 1.5X140 (MISCELLANEOUS) IMPLANT
BRUSH SUPERTRAX BIOPSY (INSTRUMENTS) IMPLANT
BRUSH SUPERTRAX NDL-TIP CYTO (INSTRUMENTS) ×2 IMPLANT
CANISTER SUCT 3000ML PPV (MISCELLANEOUS) ×4 IMPLANT
CHANNEL WORK EXTEND EDGE 180 (KITS) IMPLANT
CHANNEL WORK EXTEND EDGE 90 (KITS) IMPLANT
CONT SPEC 4OZ CLIKSEAL STRL BL (MISCELLANEOUS) ×8 IMPLANT
COVER BACK TABLE 60X90IN (DRAPES) ×4 IMPLANT
COVER WAND RF STERILE (DRAPES) ×4 IMPLANT
FILTER STRAW FLUID ASPIR (MISCELLANEOUS) IMPLANT
FORCEPS BIOP RJ4 1.8 (CUTTING FORCEPS) IMPLANT
FORCEPS BIOP SUPERTRX PREMAR (INSTRUMENTS) ×2 IMPLANT
GAUZE SPONGE 4X4 12PLY STRL (GAUZE/BANDAGES/DRESSINGS) ×4 IMPLANT
GLOVE BIO SURGEON STRL SZ 6.5 (GLOVE) ×3 IMPLANT
GLOVE BIO SURGEONS STRL SZ 6.5 (GLOVE) ×1
GLOVE BIOGEL PI IND STRL 6.5 (GLOVE) ×2 IMPLANT
GLOVE BIOGEL PI INDICATOR 6.5 (GLOVE) ×2
GLOVE SURG SS PI 6.5 STRL IVOR (GLOVE) ×4 IMPLANT
GOWN STRL NON-REIN LRG LVL3 (GOWN DISPOSABLE) ×2 IMPLANT
KIT CLEAN ENDO COMPLIANCE (KITS) ×12 IMPLANT
KIT PROCEDURE EDGE 180 (KITS) ×4 IMPLANT
KIT PROCEDURE EDGE 90 (KITS) IMPLANT
KIT TURNOVER KIT B (KITS) ×4 IMPLANT
MARKER SKIN DUAL TIP RULER LAB (MISCELLANEOUS) ×4 IMPLANT
NDL ASPIRATION VIZISHOT 19G (NEEDLE) IMPLANT
NDL ASPIRATION VIZISHOT 21G (NEEDLE) ×1 IMPLANT
NDL FILTER BLUNT 18X1 1/2 (NEEDLE) IMPLANT
NDL SUPERTRX PREMARK BIOPSY (NEEDLE) IMPLANT
NEEDLE ASPIRATION VIZISHOT 19G (NEEDLE) ×4 IMPLANT
NEEDLE ASPIRATION VIZISHOT 21G (NEEDLE) IMPLANT
NEEDLE FILTER BLUNT 18X 1/2SAF (NEEDLE)
NEEDLE FILTER BLUNT 18X1 1/2 (NEEDLE) IMPLANT
NEEDLE SUPERTRX PREMARK BIOPSY (NEEDLE) ×4 IMPLANT
NS IRRIG 1000ML POUR BTL (IV SOLUTION) ×4 IMPLANT
OIL SILICONE PENTAX (PARTS (SERVICE/REPAIRS)) ×4 IMPLANT
PAD ARMBOARD 7.5X6 YLW CONV (MISCELLANEOUS) ×8 IMPLANT
PATCHES PATIENT (LABEL) ×12 IMPLANT
STOPCOCK 4 WAY LG BORE MALE ST (IV SETS) ×4 IMPLANT
SYR 20ML ECCENTRIC (SYRINGE) ×4 IMPLANT
SYR 20ML LL LF (SYRINGE) ×4 IMPLANT
SYR 3ML LL SCALE MARK (SYRINGE) ×4 IMPLANT
SYRINGE 20CC LL (MISCELLANEOUS) ×8 IMPLANT
TOWEL GREEN STERILE (TOWEL DISPOSABLE) ×4 IMPLANT
TOWEL GREEN STERILE FF (TOWEL DISPOSABLE) ×4 IMPLANT
TRAP SPECIMEN MUCOUS 40CC (MISCELLANEOUS) ×6 IMPLANT
TUBE CONNECTING 20'X1/4 (TUBING) ×1
TUBE CONNECTING 20X1/4 (TUBING) ×3 IMPLANT
TUBING EXTENTION W/L.L. (IV SETS) ×3 IMPLANT
UNDERPAD 30X30 (UNDERPADS AND DIAPERS) ×4 IMPLANT
VALVE BIOPSY  SINGLE USE (MISCELLANEOUS) ×2
VALVE BIOPSY SINGLE USE (MISCELLANEOUS) ×2 IMPLANT
VALVE SUCTION BRONCHIO DISP (MISCELLANEOUS) ×4 IMPLANT
WATER STERILE IRR 1000ML POUR (IV SOLUTION) ×4 IMPLANT

## 2018-09-06 NOTE — H&P (Signed)
Carolyn [name],Carolyn [name]                                                   Carolyn [name]  Medical Record #[id number] Date of Birth: 1955/05/10  Referring: Curt Bears, MD Primary Care: [name], MD Primary Cardiologist: No primary care provider on file.  Chief Complaint:   No chief complaint on file.   History of Present Illness:     Carolyn [name] presents today for her biopsy.  She has done well, and denies any changes since her clinic appointment.  From previous note: Carolyn [name] 63 y.o. female is seen in the office today for a left upper lobe pulmonary nodule that was found incidentally on CT scan.  She originally presented to the ED with chest pain, and underwent a CXR which found this nodule.  She subsequently underwent a chest CT which redemonstrated this lesion as well as a right upper lobe nodule.  On PET/CT, both lesions were avid, along with left hilar avidity.  She also had an MRI brain, which showed 4 lesions concerning for metastasis.  In regards to her symptoms, she states that she has only had some mild neck pain, and occasional sharp scalp pain.         Smoking Hx: quit smoking in the early 2000s   Current Activity/ Functional Status:  Patient is independent with mobility/ambulation, transfers, ADL's, IADL's.   Zubrod Score: At the time of surgery this patient's most appropriate activity status/level should be described as: [x] ?    0    Normal activity, no symptoms [] ?    1    Restricted in physical strenuous activity but ambulatory, able to do out light work [] ?    2    Ambulatory and capable of self care, unable to do work activities, up and about               >50 % of waking hours                              [] ?    3    Only limited self care, in bed greater than 50% of waking hours [] ?    4    Completely disabled, no self care, confined to bed or  chair [] ?    5    Moribund       Past Medical History:  Diagnosis Date  . Anginal pain ([place])    " with exertion "  . Arthritis    " 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 "  . Asthma    h/o  . Coronary artery disease   . Diabetes mellitus without complication ([place])    Type II  . GERD (gastroesophageal reflux disease)   . H/O hiatal hernia   . Heart murmur   . 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 Hypertension   . Persistent headaches    h/o  . Pneumonia    hx of PNA  . Shortness of breath   . Vaginal dryness    h/o         Past Surgical History:  Procedure Laterality Date  . ABDOMINAL HYSTERECTOMY    . bone spur    . CARDIAC CATHETERIZATION  06/13/2012  . carpel tunnel surgery    . COLONOSCOPY     9 years ago  . CORONARY ANGIOPLASTY  06/13/2012  . LEFT HEART CATHETERIZATION WITH CORONARY ANGIOGRAM N/A 06/13/2012   Procedure: LEFT HEART CATHETERIZATION WITH CORONARY ANGIOGRAM;  Surgeon: Laverda Page, MD;  Location: [facility] CATH LAB;  Service: Cardiovascular;  Laterality: N/A;  . NECK SURGERY    . rotator cuff surgery Right 2015         Family History  Problem Relation Age of Onset  . Breast cancer Other   . Diabetes Mother   . Glaucoma Mother   . Hypertension Mother   . Hypertension Father   . Asthma Father   . Diabetes Sister   . Diabetes Brother      Social History       Tobacco Use  Smoking Status Former Smoker  . Packs/day: 0.50  . Years: 30.00  . Pack years: 15.00  . Quit date: 02/09/1999  . Years since quitting: 19.5  Smokeless Tobacco Never Used    Social History      Substance and Sexual Activity  Alcohol Use No     No Known Allergies        Current Outpatient Medications  Medication Sig Dispense Refill  . acetaminophen (TYLENOL) 500 MG tablet Take 1,000 mg by mouth every 6 (six) hours as needed for mild pain, fever or headache.    Marland Kitchen amLODipine (NORVASC) 5 MG tablet Take 5 mg by mouth daily.    Marland Kitchen aspirin EC 81  MG tablet Take 81 mg by mouth daily.    . carvedilol (COREG) 25 MG tablet Take 1 tablet by mouth 2 (two) times daily. 1/2 tablet    . cetirizine (ZYRTEC) 10 MG tablet Take 10 mg by mouth daily.    . Cholecalciferol (VITAMIN D-1000 MAX ST) 25 MCG (1000 UT) tablet Take 1 tablet by mouth daily.    . Cholecalciferol (VITAMIN D3) 1.25 MG (50000 UT) CAPS Vitamin D3  Take 5000 IU daily    . Chromium Picolinate 1000 MCG TABS Take 1,000 mg by mouth daily.    Marland Kitchen co-enzyme Q-10 30 MG capsule Take 30 mg by mouth 3 (three) times daily.    . cyclobenzaprine (FLEXERIL) 10 MG tablet Take 10 mg by mouth 3 (three) times daily as needed for muscle spasms.    Marland Kitchen esomeprazole (NEXIUM) 10 MG packet Take 10 mg by mouth daily before breakfast.    . metFORMIN (GLUCOPHAGE) 500 MG tablet Take 500 mg by mouth at bedtime.    . methocarbamol (ROBAXIN) 500 MG tablet Take 1 tablet (500 mg total) by mouth every 6 (six) hours as needed for muscle spasms. (Patient not taking: Reported on 08/31/2018) 21 tablet 0  . metoprolol tartrate (LOPRESSOR) 25 MG tablet Take 25 mg by mouth 2 (two) times daily.    . nitroGLYCERIN (NITROSTAT) 0.4 MG SL tablet Place 1 tablet (0.4 mg total) under the tongue every 5 (five) minutes as needed for chest pain. (Patient not taking: Reported on 08/17/2018) 30 tablet 12  . Omega-3 Fatty Acids (OMEGA 3 PO) Take 1 tablet by mouth every evening. Omega Red    . ONETOUCH ULTRA test strip USE 1 STRIP TO CHECK GLUCOSE THREE TIMES DAILY    . Pitavastatin Calcium (LIVALO) 4 MG TABS Take 4 mg by mouth daily.    Vladimir Faster  Glycol-Propyl Glycol (SYSTANE) 0.4-0.3 % SOLN Apply 1 drop to eye daily.    . psyllium (METAMUCIL) 58.6 % packet Take 1 packet by mouth at bedtime as needed.     Marland Kitchen telmisartan (MICARDIS) 40 MG tablet Take 40 mg by mouth daily.    Marland Kitchen triamterene-hydrochlorothiazide (MAXZIDE-25) 37.5-25 MG per tablet Take 1 tablet by mouth daily.    Marland Kitchen zolpidem (AMBIEN) 10 MG tablet  Take 10 mg by mouth at bedtime as needed.     No current facility-administered medications for this visit.      Review of Systems:  Constitutional: negative Ears, nose, mouth, throat, and face: negative Respiratory: negative Cardiovascular: negative except for occasional arm pain, and chest dyscomfort Gastrointestinal: negative Hematologic/lymphatic: negative Musculoskeletal:negative Neurological: negative    PHYSICAL EXAMINATION: There were no vitals taken for this visit. General: non-toxic, appears stated age.   HEENT: NCAT.  Moist mucous membranes, Oropharynx clear.    No cervical or supraclavicular adenopathy. Neuro: Alert, Oriented X 3. Non focal. Psych: appropriate mood, cheerful, and optimistic. Cardiovascular: RRR, no murmur.  Peripherally warm Pulmonary:  Clear B, no clubbing or cyanosis GI: soft, Nondistended MSK: ambulates well.    Diagnostic Studies & Laboratory data:     Recent Radiology Findings:    Imaging Results  Ct Chest W Contrast  Result Date: 08/07/2018 CLINICAL DATA:  63 year old female with chest pain and possible spiculated left upper lung nodule on earlier portable chest. EXAM: CT CHEST WITH CONTRAST TECHNIQUE: Multidetector CT imaging of the chest was performed during intravenous contrast administration. CONTRAST:  60mL OMNIPAQUE IOHEXOL 300 MG/ML  SOLN COMPARISON:  Portable chest earlier today. Chest radiographs 02/01/2013. FINDINGS: Cardiovascular: Calcified aortic atherosclerosis. Calcified coronary artery atherosclerosis and/or stents. No cardiomegaly or pericardial effusion. Mediastinum/Nodes: Prevascular mediastinal lymphadenopathy is heterogeneous and about 18 millimeters short axis. There is similar low-density left hilar lymphadenopathy, up to 15 millimeter short axis. Smaller 8 millimeter contralateral right hilar node. No precarinal or other mediastinal lymphadenopathy. Possible small hiatal hernia. Lungs/Pleura: Spiculated left upper  lobe lung mass measuring 23 x 28 millimeters (including the spiculations as seen on coronal image 66). Mild regional architectural distortion. In the contralateral right upper lobe there is a smaller subpleural 9 millimeter lung nodule on series 4, image 41. Major airways are patent. No pleural effusion or other abnormal pulmonary opacity. Upper Abdomen: Negative visible liver, gallbladder, spleen, pancreas, adrenal glands, kidneys, and intra-abdominal stomach. Diverticulosis in the distal transverse colon. Musculoskeletal: Partially visible cervical ACDF. No acute or suspicious osseous lesion identified. IMPRESSION: 1. 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 Malignant appearing left hilar and prevascular mediastinal lymphadenopathy. 2. Smaller, indeterminate contralateral subpleural right upper lobe 9 mm lung nodule. 3. Recommend referral to [facility]). Electronically Signed   By: Genevie Ann M.D.   On: 08/07/2018 17:29   Mr Jeri Cos EX Contrast  Result Date: 08/28/2018 CLINICAL DATA:  Possible lung cancer.  Staging. EXAM: MRI HEAD WITHOUT AND WITH CONTRAST TECHNIQUE: Multiplanar, multiecho pulse sequences of the brain and surrounding structures were obtained without and with intravenous contrast. CONTRAST:  8 cc Gadavist intravenous COMPARISON:  None. FINDINGS: Brain: 4 subcentimeter nodular and ring-enhancing abnormalities along the cortex of the right more than left cerebral consistent with metastatic disease in this setting: 1. High and anterior right frontal lobe on 11:69 2. High and lateral right frontal lobe on 11:61 3. High and posterior right frontal lobe on 11:61 4. Left occipital cortex on  11:48 Minimal associated vasogenic edema. Band of signal abnormality in the posterior left cerebellum without underlying enhancement to suggest metastatic disease. No acute hemorrhage, hydrocephalus, or infarct. Vascular:  Major flow voids and vascular enhancements are preserved Skull and upper cervical spine: Negative for marrow lesion Sinuses/Orbits: Negative These results will be called to the ordering clinician or representative by the Radiologist Assistant, and communication documented in the PACS or zVision Dashboard. IMPRESSION: Positive for 4 subcentimeter brain metastases. Electronically Signed   By: Monte Fantasia M.D.   On: 08/28/2018 09:44   Nm Pet Image Initial (pi) Skull Base To Thigh  Result Date: 08/28/2018 CLINICAL DATA:  Initial treatment strategy for lung mass. EXAM: NUCLEAR MEDICINE PET SKULL BASE TO THIGH TECHNIQUE: 9.1 mCi F-18 FDG was injected intravenously. Full-ring PET imaging was performed from the skull base to thigh after the radiotracer. CT data was obtained and used for attenuation correction and anatomic localization. Fasting blood glucose: 100 mg/dl COMPARISON:  Chest CT 08/07/2018 FINDINGS: Mediastinal blood pool activity: SUV max 2.7 NECK: No hypermetabolic lymph nodes in the neck. Incidental CT findings: none CHEST: Left upper lobe pulmonary nodule seen on recent chest CT measures 2.3 cm with SUV max = 12.5. 9 mm peripheral right upper lobe nodule (37/4) is hypermetabolic with SUV max = 2.7. Left hilar lymphadenopathy is hypermetabolic with SUV max = 9.8 although individual lymph nodes are not well demonstrated on this noncontrast exam. Small indeterminate hypermetabolic demonstrates focus in the right hilum SUV max = 4.0. No underlying lymphadenopathy evident in the right hilum by CT. Incidental CT findings: Centrilobular emphsyema noted. Coronary artery calcification is evident. Atherosclerotic calcification is noted in the wall of the thoracic aorta. ABDOMEN/PELVIS: No abnormal hypermetabolic activity within the liver, pancreas, adrenal glands, or spleen. No hypermetabolic lymph nodes in the abdomen or pelvis. Incidental CT findings: There is abdominal aortic atherosclerosis without  aneurysm. SKELETON: No focal hypermetabolic activity to suggest skeletal metastasis. Incidental CT findings: Sclerotic changes noted in the pubic bones bilaterally without hypermetabolism. IMPRESSION: 1. Dominant left upper lobe pulmonary nodule is hypermetabolic consistent with primary bronchogenic neoplasm. 2. Hypermetabolic lymphadenopathy in the left hilum compatible with metastatic involvement. 3. 9 mm right upper lobe pulmonary nodule demonstrates FDG uptake, suspicious for neoplasm. Metastatic disease or synchronous bronchogenic carcinoma would be considerations. 4. Small hypermetabolic focus in the right hilum without underlying lymphadenopathy. Metastatic involvement not excluded. 5.  Aortic Atherosclerois (ICD10-170.0) 6.  Emphysema. ([id number]) Electronically Signed   By: Misty Stanley M.D.   On: 08/28/2018 13:51   Dg Chest Port 1 View  Result Date: 08/07/2018 CLINICAL DATA:  Chest pain EXAM: PORTABLE CHEST 1 VIEW COMPARISON:  None. FINDINGS: The heart size and mediastinal contours are within normal limits. There is a spiculated appearing nodule of the left upper lobe measuring approximately 2.6 cm in projection. The visualized skeletal structures are unremarkable. IMPRESSION: 1.  No acute appearing airspace opacity. 2. There is a spiculated appearing nodule of the left upper lobe measuring approximately 2.6 cm in projection, concerning for malignancy. Recommend CT to further evaluate. Electronically Signed   By: Eddie Candle M.D.   On: 08/07/2018 14:55           I have independently reviewed the above radiology studies  and reviewed the findings with the patient.   Recent Lab Findings: Recent Labs       Lab Results  Component Value Date   WBC 6.4 08/17/2018   HGB 11.3 (L) 08/17/2018   HCT 37.6 08/17/2018  PLT 350 08/17/2018   GLUCOSE 98 08/17/2018   ALT 13 08/17/2018   AST 22 08/17/2018   NA 141 08/17/2018   K 4.1 08/17/2018   CL 104 08/17/2018    CREATININE 1.00 08/17/2018   BUN 6 (L) 08/17/2018   CO2 28 08/17/2018   INR 0.93 06/14/2013       PFTs: none   Assessment / Plan:    2.8cm left upper lobe pulmonary nodule.  SUV 12.5 0.9cm right upper lobe pulmonary nodule.  SUV 2.7 Left hilar adenopathy. SUV 9.8 Right hilar adenopathy. SUV 4 4 subcentimeter intra-cranial nodule  63 yo female with dominant left upper lobe pulmonary nodule, and concern for stage IV lung cancer.  I have recommended that she undergo navigational bronchoscopy and EBUS to obtain a tissue diagnosis.  Alternative, risks and benefits have been discussed, and she is agreeable to proceed.      Lajuana Matte 08/31/2018 4:02 PM

## 2018-09-06 NOTE — Transfer of Care (Signed)
Immediate Anesthesia Transfer of Care Note  Patient: Carolyn [name]  Procedure(s) Performed: VIDEO BRONCHOSCOPY WITH ENDOBRONCHIAL ULTRASOUND, left lung (Left Chest) VIDEO BRONCHOSCOPY WITH ENDOBRONCHIAL NAVIGATION, left upper lung (N/A Chest)  Patient Location: PACU  Anesthesia Type:General  Level of Consciousness: drowsy and patient cooperative  Airway & Oxygen Therapy: Patient Spontanous Breathing and Patient connected to nasal cannula oxygen  Post-op Assessment: Report given to RN  Post vital signs: Reviewed and stable  Last Vitals:  Vitals Value Taken Time  BP    Temp    Pulse 69 09/06/18 1416  Resp    SpO2 97 % 09/06/18 1416  Vitals shown include unvalidated device data.  Last Pain:  Vitals:   09/06/18 0954  TempSrc:   PainSc: 0-No pain      Patients Stated Pain Goal: 3 (55/73/22 0254)  Complications: No apparent anesthesia complications

## 2018-09-06 NOTE — Anesthesia Procedure Notes (Signed)
Procedure Name: Intubation Date/Time: 09/06/2018 12:28 PM Performed by: Barrington Ellison, CRNA Pre-anesthesia Checklist: Patient identified, Emergency Drugs available, Suction available and Patient being monitored Patient Re-evaluated:Patient Re-evaluated prior to induction Oxygen Delivery Method: Circle System Utilized Preoxygenation: Pre-oxygenation with 100% oxygen Induction Type: IV induction Ventilation: Mask ventilation without difficulty Laryngoscope Size: Mac and 3 Grade View: Grade I Tube type: Oral Tube size: 8.5 mm Number of attempts: 1 Airway Equipment and Method: Stylet and Oral airway Placement Confirmation: ETT inserted through vocal cords under direct vision,  positive ETCO2 and breath sounds checked- equal and bilateral Secured at: 22 cm Tube secured with: Tape Dental Injury: Teeth and Oropharynx as per pre-operative assessment

## 2018-09-06 NOTE — Op Note (Signed)
     [address]       [place] [address]             [phone]        09/06/2018    PATIENT:  Carolyn [name] PRE-OPERATIVE DIAGNOSIS:  Left upper lobe pulmonary nodule POST-OPERATIVE DIAGNOSIS:  same  PROCEDURE:  Procedure(s): VIDEO BRONCHOSCOPY WITH ENDOBRONCHIAL ULTRASOUND (N/A) VIDEO BRONCHOSCOPY WITH ENDOBRONCHIAL NAVIGATION (N/A)  SURGEON:  Surgeon(s) and Role:    * Paxton Binns, Lucile Crater, MD - Primary    * S. Roxan Hockey, MD - Assisting  ANESTHESIA:   General EBL:  minimal  BLOOD ADMINISTERED:none SPECIMEN:   11L lymph node, left upper lobe pulmonary nodule  INDICATION:  [name]63 y.o.femaleis seen in the office today for a left upper lobe pulmonary nodule that was found incidentally on CT scan. She originally presented to the ED with chest pain, and underwent a CXR which found this nodule. She subsequently underwent a chest CT which redemonstrated this lesion as well as a right upper lobe nodule. On PET/CT, both lesions were avid, along with left hilar avidity. She also had an MRI brain, which showed 4 lesions concerning for metastasis.  FINDINGS: Good localization with the navigational system.  Adequate lymph tissue on EBUS.  Atypical cells seen on nodule brushings  OPERATIVE TECHNIQUE: Planning for the navigational bronchoscopy was done prior to induction.  Anesthesia was induced.  Pre-operative antibiotics were dose, and the patient was prepped and draped in normal sterile fashion.  An appropriate surgical pause was performed.      Flexible fiberoptic bronchoscopy was performed via the endotracheal tube.  It revealed normal endobronchial anatomy with no endobronchial lesions to the level of the subsegmental bronchi.    The endobronchial ultrasound probe was advanced.  Systematic inspection of the hilar and mediastinal areas was carried out and appropriate lymph nodes for sampling were identified.  Multiple aspirations were  performed from a level 11 L lymph node.  The endobronchial ultrasound probe was removed.    The bronchoscope was advanced through the ET tube, and the locatable guide for navigation was placed and registration was performed.  There was good correlation of the video and virtual bronchoscopy.  The guide was advanced to the appropriate subsegmental bronchus and advanced to within 1 centimeters of the center of the lesion.  Fluoroscopy was used for all sampling.  Multiple needle aspirations and brushings were performed.  The locatable guide was reinserted after every third to fourth sample to ensure continued alignment and proximity to the lesion.  While awaiting the results of the aspirations, multiple biopsies were performed.  These were sent for permanent pathology.    The bronchoscope was reinserted and a final inspection was made.  There was no significant ongoing bleeding.  The bronchoscope was removed.  Additional biopsies were taken for permanent pathology as well as for AFB and fungal cultures.  The bronchoscope was withdrawn.   The patient was extubated in the operating room and taken to the PACU in good condition.

## 2018-09-06 NOTE — Anesthesia Postprocedure Evaluation (Signed)
Anesthesia Post Note  Patient: Carolyn [name]  Procedure(s) Performed: VIDEO BRONCHOSCOPY WITH ENDOBRONCHIAL ULTRASOUND, left lung (Left Chest) VIDEO BRONCHOSCOPY WITH ENDOBRONCHIAL NAVIGATION, left upper lung (N/A Chest)     Patient location during evaluation: PACU Anesthesia Type: General Level of consciousness: awake and alert Pain management: pain level controlled Vital Signs Assessment: post-procedure vital signs reviewed and stable Respiratory status: spontaneous breathing, nonlabored ventilation, respiratory function stable and patient connected to nasal cannula oxygen Cardiovascular status: blood pressure returned to baseline and stable Postop Assessment: no apparent nausea or vomiting Anesthetic complications: no    Last Vitals:  Vitals:   09/06/18 1515 09/06/18 1530  BP: 139/68 138/68  Pulse: 62 61  Resp: 20 (!) 22  Temp:    SpO2: 97% 95%    Last Pain:  Vitals:   09/06/18 1430  TempSrc:   PainSc: Asleep                 Luz Burcher COKER

## 2018-09-07 ENCOUNTER — Other Ambulatory Visit: Payer: Self-pay | Admitting: Radiation Therapy

## 2018-09-07 ENCOUNTER — Encounter (HOSPITAL_COMMUNITY): Payer: Self-pay | Admitting: Thoracic Surgery (Cardiothoracic Vascular Surgery)

## 2018-09-07 DIAGNOSIS — C7931 Secondary malignant neoplasm of brain: Secondary | ICD-10-CM

## 2018-09-07 DIAGNOSIS — C7949 Secondary malignant neoplasm of other parts of nervous system: Secondary | ICD-10-CM

## 2018-09-08 ENCOUNTER — Telehealth: Payer: Self-pay | Admitting: *Deleted

## 2018-09-08 NOTE — Telephone Encounter (Signed)
[place] Work  Clinical Social Work was referred by Pension scheme manager for assessment of psychosocial needs.  Clinical Social Worker contacted patient by phone  to offer support and assess for needs.  Patient reported she will see medical oncologist next week to determine treatment plan.  Carolyn [name] identified her spouse and daughter as a strong support system.  She shared her husband is a bishop in [employer] and they have a very strong faith.  Patient has no concerns, expressed she tends to "handle things as they come".  CSW encouraged patient to follow up as needed with CSW.   Gwinda Maine, LCSW  Clinical Social Worker [facility]

## 2018-09-08 NOTE — Progress Notes (Addendum)
Has armband been applied?  Yes  Does patient have an allergy to IV contrast dye?: No   Has patient ever received premedication for IV contrast dye?: N/A  Does patient take metformin?: Yes. She is to have repeat labs on 09/20/18. I will call her when she is ready to resume her metformin. She is aware of this.   If patient does take metformin when was the last dose: 09/17/18  Date of lab work: 09/04/18 BUN: 10 CR: 0.92 EGFR: >60  IV site: Right AC  Has IV site been added to flowsheet?  Yes.   BP (!) 147/72 (BP Location: Left Arm)   Pulse 63   Temp 98.9 F (37.2 C) (Oral)   Wt 180 lb 1.6 oz (81.7 kg)   SpO2 100%   BMI 34.59 kg/m

## 2018-09-11 ENCOUNTER — Other Ambulatory Visit: Payer: Self-pay | Admitting: Physician Assistant

## 2018-09-11 ENCOUNTER — Other Ambulatory Visit: Payer: Self-pay | Admitting: Radiation Therapy

## 2018-09-11 DIAGNOSIS — R918 Other nonspecific abnormal finding of lung field: Secondary | ICD-10-CM

## 2018-09-12 ENCOUNTER — Other Ambulatory Visit: Payer: Self-pay | Admitting: Radiation Therapy

## 2018-09-12 ENCOUNTER — Telehealth: Payer: Self-pay | Admitting: Physician Assistant

## 2018-09-12 ENCOUNTER — Inpatient Hospital Stay: Payer: Medicare Other

## 2018-09-12 ENCOUNTER — Encounter: Payer: Self-pay | Admitting: *Deleted

## 2018-09-12 ENCOUNTER — Encounter: Payer: Self-pay | Admitting: Physician Assistant

## 2018-09-12 ENCOUNTER — Other Ambulatory Visit: Payer: Self-pay

## 2018-09-12 ENCOUNTER — Inpatient Hospital Stay: Payer: Medicare Other | Attending: Physician Assistant | Admitting: Physician Assistant

## 2018-09-12 VITALS — BP 138/65 | HR 76 | Temp 99.1°F | Resp 17 | Ht 60.5 in | Wt 178.9 lb

## 2018-09-12 DIAGNOSIS — C3412 Malignant neoplasm of upper lobe, left bronchus or lung: Secondary | ICD-10-CM | POA: Diagnosis not present

## 2018-09-12 DIAGNOSIS — C7931 Secondary malignant neoplasm of brain: Secondary | ICD-10-CM

## 2018-09-12 DIAGNOSIS — R918 Other nonspecific abnormal finding of lung field: Secondary | ICD-10-CM | POA: Diagnosis not present

## 2018-09-12 DIAGNOSIS — C3490 Malignant neoplasm of unspecified part of unspecified bronchus or lung: Secondary | ICD-10-CM | POA: Diagnosis not present

## 2018-09-12 DIAGNOSIS — I25118 Atherosclerotic heart disease of native coronary artery with other forms of angina pectoris: Secondary | ICD-10-CM | POA: Diagnosis not present

## 2018-09-12 DIAGNOSIS — Z79899 Other long term (current) drug therapy: Secondary | ICD-10-CM | POA: Insufficient documentation

## 2018-09-12 DIAGNOSIS — Z5112 Encounter for antineoplastic immunotherapy: Secondary | ICD-10-CM | POA: Diagnosis not present

## 2018-09-12 DIAGNOSIS — Z5111 Encounter for antineoplastic chemotherapy: Secondary | ICD-10-CM | POA: Insufficient documentation

## 2018-09-12 DIAGNOSIS — C7949 Secondary malignant neoplasm of other parts of nervous system: Secondary | ICD-10-CM

## 2018-09-12 LAB — CBC WITH DIFFERENTIAL (CANCER CENTER ONLY)
Abs Immature Granulocytes: 0.01 10*3/uL (ref 0.00–0.07)
Basophils Absolute: 0 10*3/uL (ref 0.0–0.1)
Basophils Relative: 0 %
Eosinophils Absolute: 0.2 10*3/uL (ref 0.0–0.5)
Eosinophils Relative: 3 %
HCT: 37.8 % (ref 36.0–46.0)
Hemoglobin: 11.2 g/dL — ABNORMAL LOW (ref 12.0–15.0)
Immature Granulocytes: 0 %
Lymphocytes Relative: 21 %
Lymphs Abs: 1.6 10*3/uL (ref 0.7–4.0)
MCH: 22.9 pg — ABNORMAL LOW (ref 26.0–34.0)
MCHC: 29.6 g/dL — ABNORMAL LOW (ref 30.0–36.0)
MCV: 77.3 fL — ABNORMAL LOW (ref 80.0–100.0)
Monocytes Absolute: 0.6 10*3/uL (ref 0.1–1.0)
Monocytes Relative: 8 %
Neutro Abs: 5.5 10*3/uL (ref 1.7–7.7)
Neutrophils Relative %: 68 %
Platelet Count: 373 10*3/uL (ref 150–400)
RBC: 4.89 MIL/uL (ref 3.87–5.11)
RDW: 16.9 % — ABNORMAL HIGH (ref 11.5–15.5)
WBC Count: 7.9 10*3/uL (ref 4.0–10.5)
nRBC: 0 % (ref 0.0–0.2)

## 2018-09-12 LAB — CMP (CANCER CENTER ONLY)
ALT: 14 U/L (ref 0–44)
AST: 16 U/L (ref 15–41)
Albumin: 3.5 g/dL (ref 3.5–5.0)
Alkaline Phosphatase: 85 U/L (ref 38–126)
Anion gap: 11 (ref 5–15)
BUN: 11 mg/dL (ref 8–23)
CO2: 27 mmol/L (ref 22–32)
Calcium: 9.5 mg/dL (ref 8.9–10.3)
Chloride: 103 mmol/L (ref 98–111)
Creatinine: 1.09 mg/dL — ABNORMAL HIGH (ref 0.44–1.00)
GFR, Est AFR Am: >60 mL/min (ref >60)
GFR, Estimated: 53 mL/min — ABNORMAL LOW (ref >60)
Glucose, Bld: 153 mg/dL — ABNORMAL HIGH (ref 70–99)
Potassium: 3.8 mmol/L (ref 3.5–5.1)
Sodium: 141 mmol/L (ref 135–145)
Total Bilirubin: 0.3 mg/dL (ref 0.3–1.2)
Total Protein: 7.6 g/dL (ref 6.5–8.1)

## 2018-09-12 NOTE — Telephone Encounter (Signed)
Gave avs and  Calendar

## 2018-09-12 NOTE — Progress Notes (Signed)
[facility] OFFICE PROGRESS NOTE  Carolyn Gravel, MD [address] [place] [address]  DIAGNOSIS: stage IV (T1c, N1, M1 C) non-small cell lung cancer, adenocarcinoma. She presented with left upper lobe lung nodule in addition to right upper lobe lung nodule with left hilar lymphadenopathy as well as multiple brain metastasis.   PRIOR THERAPY: None  CURRENT THERAPY: None   INTERVAL HISTORY: Carolyn [name] 63 y.o. female returns to the clinic for a follow-up visit.The patient is feeling well today without any concerning complaints.  She denies any fever, chills, night sweats, or weight loss. She denies any chest pain, shortness of breath, cough, or hemoptysis.  She denies any nausea, vomiting, diarrhea, or constipation.  She denies any headaches, seizures, or extremity weakness. The patient endorses that a few weeks ago she experienced some visual symptoms of "swiveling" lights and floaters.  Her visual symptoms did subside have not reoccurred since that time. She denies any other associated symptoms including numbness, tingling, weakness, visual loss, blurriness, or eye pain. The patient was recently found to have brain metastases.  She was referred to Dr. Eppie Gibson from radiation oncology for stereotactic radiotherapy to her brain lesions.  She is expected to have her CT simulation starting next week on 09-18-2018.  She also has an appointment with neurosurgery next week.  The patient recently underwent a bronchoscopy with endobronchial ultrasound and biopsy.  She is here today for evaluation and to discuss her results and treatment options.   MEDICAL HISTORY: Past Medical History:  Diagnosis Date  . Anginal pain ([place])    " with exertion "  . Arthritis    " 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 "  . Asthma    h/o  . Coronary artery disease   . Diabetes mellitus without complication ([place])    Type II  . GERD (gastroesophageal reflux disease)   . H/O hiatal hernia   . Heart murmur    . 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 Hypertension   . Persistent headaches    h/o  . Pneumonia    hx of PNA  . Shortness of breath   . Vaginal dryness    h/o    ALLERGIES:  is allergic to lisinopril.  MEDICATIONS:  Current Outpatient Medications  Medication Sig Dispense Refill  . acetaminophen (TYLENOL) 500 MG tablet Take 1,000 mg by mouth every 8 (eight) hours as needed for mild pain, fever or headache.     Marland Kitchen amLODipine (NORVASC) 5 MG tablet Take 5 mg by mouth at bedtime.     Marland Kitchen aspirin EC 81 MG tablet Take 81 mg by mouth daily.    . carvedilol (COREG) 25 MG tablet Take 12.5 mg by mouth 2 (two) times daily with a meal.     . cetirizine (ZYRTEC) 10 MG tablet Take 10 mg by mouth daily.    . Cholecalciferol (VITAMIN D-3) 125 MCG (5000 UT) TABS Take 5,000 Units by mouth daily.    . Chromium Picolinate 1000 MCG TABS Take 1,000 mg by mouth daily.    Marland Kitchen co-enzyme Q-10 30 MG capsule Take 30 mg by mouth daily.     Marland Kitchen esomeprazole (NEXIUM) 20 MG capsule Take 20 mg by mouth daily at 12 noon.    . ferrous sulfate 325 (65 FE) MG tablet Take 325 mg by mouth daily with breakfast.    . Ginger, Zingiber officinalis, (GINGER ROOT) 500 MG CAPS Take 500 mg by mouth daily.    Marland Kitchen  hydrochlorothiazide (MICROZIDE) 12.5 MG capsule Take 12.5 mg by mouth daily.    Javier Docker Oil 1000 MG CAPS Take 1,000 mg by mouth daily.    . metFORMIN (GLUCOPHAGE) 500 MG tablet Take 500 mg by mouth 2 (two) times daily with a meal.     . methocarbamol (ROBAXIN) 500 MG tablet Take 1 tablet (500 mg total) by mouth every 6 (six) hours as needed for muscle spasms. 21 tablet 0  . ONETOUCH ULTRA test strip USE 1 STRIP TO CHECK GLUCOSE THREE TIMES DAILY    . Pitavastatin Calcium (LIVALO) 4 MG TABS Take 4 mg by mouth at bedtime.     Vladimir Faster Glycol-Propyl Glycol (SYSTANE) 0.4-0.3 % SOLN Place 1 drop into both eyes every 6 (six) hours as needed (dry eyes).     . psyllium (METAMUCIL) 58.6 % packet Take 1 packet by mouth at bedtime as needed  (constipation).     Marland Kitchen telmisartan (MICARDIS) 40 MG tablet Take 40 mg by mouth daily.    . Turmeric 500 MG CAPS Take 500 mg by mouth daily.    Marland Kitchen zolpidem (AMBIEN) 10 MG tablet Take 2.5 mg by mouth at bedtime.     . nitroGLYCERIN (NITROSTAT) 0.4 MG SL tablet Place 1 tablet (0.4 mg total) under the tongue every 5 (five) minutes as needed for chest pain. (Patient not taking: Reported on 09/12/2018) 30 tablet 12   No current facility-administered medications for this visit.     SURGICAL HISTORY:  Past Surgical History:  Procedure Laterality Date  . ABDOMINAL HYSTERECTOMY    . bone spur    . CARDIAC CATHETERIZATION  06/13/2012  . carpel tunnel surgery Left   . COLONOSCOPY     9 years ago  . CORONARY ANGIOPLASTY  06/13/2012  . EYE SURGERY     cyst left eye   . LEFT HEART CATHETERIZATION WITH CORONARY ANGIOGRAM N/A 06/13/2012   Procedure: LEFT HEART CATHETERIZATION WITH CORONARY ANGIOGRAM;  Surgeon: Laverda Page, MD;  Location: [facility] CATH LAB;  Service: Cardiovascular;  Laterality: N/A;  . NECK SURGERY    . rotator cuff surgery Right 2015  . VIDEO BRONCHOSCOPY WITH ENDOBRONCHIAL NAVIGATION N/A 09/06/2018   Procedure: VIDEO BRONCHOSCOPY WITH ENDOBRONCHIAL NAVIGATION, left upper lung;  Surgeon: Lajuana Matte, MD;  Location: [facility];  Service: Thoracic;  Laterality: N/A;  . VIDEO BRONCHOSCOPY WITH ENDOBRONCHIAL ULTRASOUND Left 09/06/2018   Procedure: VIDEO BRONCHOSCOPY WITH ENDOBRONCHIAL ULTRASOUND, left lung;  Surgeon: Lajuana Matte, MD;  Location: [facility];  Service: Thoracic;  Laterality: Left;  . WISDOM TOOTH EXTRACTION      REVIEW OF SYSTEMS:   Review of Systems  Constitutional: Negative for appetite change, chills, fatigue, fever and unexpected weight change.  HENT:   Negative for mouth sores, nosebleeds, sore throat and trouble swallowing.   Eyes: Positive for "floaters". Negative for diplopia, visual blurring, photophobia, or icterus.  Respiratory: Negative for cough, hemoptysis,  shortness of breath and wheezing.   Cardiovascular: Negative for chest pain and leg swelling.  Gastrointestinal: Negative for abdominal pain, constipation, diarrhea, nausea and vomiting.  Genitourinary: Negative for bladder incontinence, difficulty urinating, dysuria, frequency and hematuria.   Musculoskeletal: Negative for back pain, gait problem, neck pain and neck stiffness.  Skin: Negative for itching and rash.  Neurological: Negative for dizziness, extremity weakness, gait problem, headaches, light-headedness and seizures.  Hematological: Negative for adenopathy. Does not bruise/bleed easily.  Psychiatric/Behavioral: Negative for confusion, depression and sleep disturbance. The patient is not nervous/anxious.  PHYSICAL EXAMINATION:  Blood pressure 138/65, pulse 76, temperature 99.1 F (37.3 C), temperature source Oral, resp. rate 17, height 5' 0.5" (1.537 m), weight 178 lb 14.4 oz (81.1 kg), SpO2 100 %.  ECOG PERFORMANCE STATUS: 1 - Symptomatic but completely ambulatory  Physical Exam  Constitutional: Oriented to person, place, and time and well-developed, well-nourished, and in no distress.  HENT:  Head: Normocephalic and atraumatic.  Mouth/Throat: Oropharynx is clear and moist. No oropharyngeal exudate.  Eyes: Conjunctivae are normal. Right eye exhibits no discharge. Left eye exhibits no discharge. No scleral icterus.  Neck: Normal range of motion. Neck supple.  Cardiovascular: Normal rate, regular rhythm, normal heart sounds and intact distal pulses.   Pulmonary/Chest: Effort normal and breath sounds normal. No respiratory distress. No wheezes. No rales.  Abdominal: Soft. Bowel sounds are normal. Exhibits no distension and no mass. There is no tenderness.  Musculoskeletal: Normal range of motion. Exhibits no edema.  Lymphadenopathy:    No cervical adenopathy.  Neurological: Alert and oriented to person, place, and time. Exhibits normal muscle tone. Gait normal. Coordination  normal.  Skin: Skin is warm and dry. No rash noted. Not diaphoretic. No erythema. No pallor.  Psychiatric: Mood, memory and judgment normal.  Vitals reviewed.  LABORATORY DATA: Lab Results  Component Value Date   WBC 7.9 09/12/2018   HGB 11.2 (L) 09/12/2018   HCT 37.8 09/12/2018   MCV 77.3 (L) 09/12/2018   PLT 373 09/12/2018      Chemistry      Component Value Date/Time   NA 141 09/12/2018 1049   K 3.8 09/12/2018 1049   CL 103 09/12/2018 1049   CO2 27 09/12/2018 1049   BUN 11 09/12/2018 1049   CREATININE 1.09 (H) 09/12/2018 1049      Component Value Date/Time   CALCIUM 9.5 09/12/2018 1049   ALKPHOS 85 09/12/2018 1049   AST 16 09/12/2018 1049   ALT 14 09/12/2018 1049   BILITOT 0.3 09/12/2018 1049       RADIOGRAPHIC STUDIES:  Dg Chest 2 View  Result Date: 09/05/2018 CLINICAL DATA:  Left lung nodule. Former smoker. EXAM: CHEST - 2 VIEW COMPARISON:  07/30/2018 FINDINGS: The heart size and mediastinal contours are within normal limits. Aortic atherosclerosis. 3 cm spiculated pulmonary nodule in the left upper lobe shows no significant change compared to recent exam. The lungs are otherwise clear. No evidence of pleural effusion. No definite lymphadenopathy identified. IMPRESSION: Stable 3 cm spiculated left upper lobe pulmonary nodule, highly suspicious for bronchogenic carcinoma. Electronically Signed   By: Marlaine Hind M.D.   On: 09/05/2018 08:15   Mr Jeri Cos FB Contrast  Result Date: 08/28/2018 CLINICAL DATA:  Possible lung cancer.  Staging. EXAM: MRI HEAD WITHOUT AND WITH CONTRAST TECHNIQUE: Multiplanar, multiecho pulse sequences of the brain and surrounding structures were obtained without and with intravenous contrast. CONTRAST:  8 cc Gadavist intravenous COMPARISON:  None. FINDINGS: Brain: 4 subcentimeter nodular and ring-enhancing abnormalities along the cortex of the right more than left cerebral consistent with metastatic disease in this setting: 1. High and anterior  right frontal lobe on 11:69 2. High and lateral right frontal lobe on 11:61 3. High and posterior right frontal lobe on 11:61 4. Left occipital cortex on 11:48 Minimal associated vasogenic edema. Band of signal abnormality in the posterior left cerebellum without underlying enhancement to suggest metastatic disease. No acute hemorrhage, hydrocephalus, or infarct. Vascular: Major flow voids and vascular enhancements are preserved Skull and upper cervical spine: Negative for marrow  lesion Sinuses/Orbits: Negative These results will be called to the ordering clinician or representative by the Radiologist Assistant, and communication documented in the PACS or zVision Dashboard. IMPRESSION: Positive for 4 subcentimeter brain metastases. Electronically Signed   By: Monte Fantasia M.D.   On: 08/28/2018 09:44   Nm Pet Image Initial (pi) Skull Base To Thigh  Result Date: 08/28/2018 CLINICAL DATA:  Initial treatment strategy for lung mass. EXAM: NUCLEAR MEDICINE PET SKULL BASE TO THIGH TECHNIQUE: 9.1 mCi F-18 FDG was injected intravenously. Full-ring PET imaging was performed from the skull base to thigh after the radiotracer. CT data was obtained and used for attenuation correction and anatomic localization. Fasting blood glucose: 100 mg/dl COMPARISON:  Chest CT 08/07/2018 FINDINGS: Mediastinal blood pool activity: SUV max 2.7 NECK: No hypermetabolic lymph nodes in the neck. Incidental CT findings: none CHEST: Left upper lobe pulmonary nodule seen on recent chest CT measures 2.3 cm with SUV max = 12.5. 9 mm peripheral right upper lobe nodule (16/0) is hypermetabolic with SUV max = 2.7. Left hilar lymphadenopathy is hypermetabolic with SUV max = 9.8 although individual lymph nodes are not well demonstrated on this noncontrast exam. Small indeterminate hypermetabolic demonstrates focus in the right hilum SUV max = 4.0. No underlying lymphadenopathy evident in the right hilum by CT. Incidental CT findings: Centrilobular  emphsyema noted. Coronary artery calcification is evident. Atherosclerotic calcification is noted in the wall of the thoracic aorta. ABDOMEN/PELVIS: No abnormal hypermetabolic activity within the liver, pancreas, adrenal glands, or spleen. No hypermetabolic lymph nodes in the abdomen or pelvis. Incidental CT findings: There is abdominal aortic atherosclerosis without aneurysm. SKELETON: No focal hypermetabolic activity to suggest skeletal metastasis. Incidental CT findings: Sclerotic changes noted in the pubic bones bilaterally without hypermetabolism. IMPRESSION: 1. Dominant left upper lobe pulmonary nodule is hypermetabolic consistent with primary bronchogenic neoplasm. 2. Hypermetabolic lymphadenopathy in the left hilum compatible with metastatic involvement. 3. 9 mm right upper lobe pulmonary nodule demonstrates FDG uptake, suspicious for neoplasm. Metastatic disease or synchronous bronchogenic carcinoma would be considerations. 4. Small hypermetabolic focus in the right hilum without underlying lymphadenopathy. Metastatic involvement not excluded. 5.  Aortic Atherosclerois (ICD10-170.0) 6.  Emphysema. ([id number]) Electronically Signed   By: Misty Stanley M.D.   On: 08/28/2018 13:51   Dg C-arm Bronchoscopy  Result Date: 09/06/2018 C-ARM BRONCHOSCOPY: Fluoroscopy was utilized by the requesting physician.  No radiographic interpretation.     ASSESSMENT/PLAN:  This is a very pleasant 63 year old African-American female with stage IV (T1c, N1, M1c) lung cancer. She presented with a dominant left upper lobe lung nodule in addition to a right upper lobe pulmonary nodule with left hilar lymphadenopathy. She also has metastatic disease to the brain.   She is under the care of Dr. Isidore Moos from radiation oncology for her brain metastases. She is scheduled for her CT simulation starting next week. She also has an appointment next week with neurosurgery for evaluation.  The patient recently had a biopsy  performed of the lung mass. The patient seen Dr. Julien Nordmann today.  Dr. Julien Nordmann had a lengthy discussion with the patient today about her current condition and treatment options.  Her biopsy was consistent with non-small cell lung cancer, adenocarcinoma.   We will arrange for the patient's biopsy to be sent for foundation 1 and PDL 1 testing to see if the patient is a candidate for any targeted oral chemotherapy agents.  We will see the patient back for follow-up visit in 2 weeks for evaluation and to discuss treatment  options pending results of her molecular studies.  Dr. Julien Nordmann will likely recommend palliative systemic chemotherapy with carboplatin, Alimta, and Keytruda if the patient's molecular studies are negative for any targetable mutations.   The patient was advised to call immediately if she has any concerning symptoms in the interval. The patient voices understanding of current disease status and treatment options and is in agreement with the current care plan. All questions were answered. The patient knows to call the clinic with any problems, questions or concerns. We can certainly see the patient much sooner if necessary   Orders Placed This Encounter  Procedures  . CMP ([facility] only)    Standing Status:   Future    Standing Expiration Date:   09/12/2019  . CBC with Differential (Cancer Center Only)    Standing Status:   Future    Standing Expiration Date:   09/12/2019     Tobe Sos Heilingoetter, PA-C 09/12/18  ADDENDUM: Hematology/Oncology Attending: I had a face-to-face encounter with the patient today.  I recommended her care plan.  This is a very pleasant 63 years old African-American female who was recently diagnosed with stage IV non-small cell lung cancer, adenocarcinoma presented with dominant left upper lobe lung nodule in addition to right upper lobe pulmonary nodule and left hilar adenopathy as well as metastatic disease to the brain.  Her recent tissue biopsy was  consistent with non-small cell lung cancer adenocarcinoma. The patient is currently followed by her radiation oncology and neurosurgery for her brain metastasis and expected to undergo stereotactic radiotherapy soon. I will arrange for the tissue block to be sent to foundation 1 for molecular studies and PDL 1 expression. I will arrange for the patient to come back for follow-up visit in around 2 weeks for more detailed discussion of her treatment options based on the molecular studies. The patient was advised to call immediately if she has any concerning symptoms in the interval.  Disclaimer: This note was dictated with voice recognition software. Similar sounding words can inadvertently be transcribed and may be missed upon review. Eilleen Kempf, MD 09/12/18

## 2018-09-12 NOTE — Patient Instructions (Signed)
-  You have non-small cell lung cancer. A subtype called adenocarcinoma -Your biopsy is being tested for genetic markers to see if we can use a chemotherapy drug that you take by mouth -We will see you back in about 2 weeks to discuss the results of the genetic tests and treatment options

## 2018-09-12 NOTE — Progress Notes (Signed)
Oncology Nurse Navigator Documentation  Oncology Nurse Navigator Flowsheets 09/12/2018  Navigator Follow Up Date: -  Navigator Follow Up Reason: -  Navigator Location [facility]  Referral Date to RadOnc/MedOnc -  Navigator Encounter Type Other/per Dr. Julien Nordmann, I requested pathology to send recent biopsy [phone] for foundation one and PDL 1 testing.    Telephone -  Abnormal Finding Date -  Confirmed Diagnosis Date -  Multidisiplinary Clinic Date -  Patient Visit Type -  Treatment Phase Pre-Tx/Tx Discussion  Barriers/Navigation Needs Coordination of Care  Education -  Interventions Coordination of Care  Coordination of Care Other  Education Method -  Acuity Level 2  Time Spent with Patient 15

## 2018-09-14 ENCOUNTER — Other Ambulatory Visit: Payer: Self-pay | Admitting: *Deleted

## 2018-09-14 NOTE — Progress Notes (Signed)
The proposed treatment discussed in cancer conference 09/14/2018 is for discussion purpose only and not a binding recommendation. The patient was not physically examined nor present for their treatment options.  Therefore, final treatment plans cannot be decided.

## 2018-09-15 ENCOUNTER — Ambulatory Visit (HOSPITAL_COMMUNITY)
Admission: RE | Admit: 2018-09-15 | Discharge: 2018-09-15 | Disposition: A | Discharge status: Home / Self Care | Payer: Medicare Other | Source: Ambulatory Visit | Attending: Radiation Oncology | Admitting: Radiation Oncology

## 2018-09-15 ENCOUNTER — Other Ambulatory Visit: Payer: Self-pay

## 2018-09-15 DIAGNOSIS — C7931 Secondary malignant neoplasm of brain: Secondary | ICD-10-CM | POA: Diagnosis not present

## 2018-09-15 DIAGNOSIS — C3490 Malignant neoplasm of unspecified part of unspecified bronchus or lung: Secondary | ICD-10-CM | POA: Diagnosis not present

## 2018-09-15 DIAGNOSIS — C7949 Secondary malignant neoplasm of other parts of nervous system: Secondary | ICD-10-CM | POA: Insufficient documentation

## 2018-09-15 MED ORDER — GADOBUTROL 1 MMOL/ML IV SOLN
8.0000 mL | Freq: Once | INTRAVENOUS | Status: AC | PRN
  Administered 2018-09-15: 16:00:00 8 mL via INTRAVENOUS

## 2018-09-18 ENCOUNTER — Ambulatory Visit
Admission: RE | Admit: 2018-09-18 | Discharge: 2018-09-18 | Disposition: A | Discharge status: Home / Self Care | Payer: Medicare Other | Source: Ambulatory Visit | Attending: Radiation Oncology | Admitting: Radiation Oncology

## 2018-09-18 ENCOUNTER — Other Ambulatory Visit: Payer: Self-pay

## 2018-09-18 ENCOUNTER — Inpatient Hospital Stay: Payer: Medicare Other

## 2018-09-18 VITALS — BP 147/72 | HR 63 | Temp 98.9°F | Wt 180.1 lb

## 2018-09-18 DIAGNOSIS — Z510 Encounter for antineoplastic radiation therapy: Secondary | ICD-10-CM | POA: Insufficient documentation

## 2018-09-18 DIAGNOSIS — C7931 Secondary malignant neoplasm of brain: Secondary | ICD-10-CM | POA: Diagnosis not present

## 2018-09-18 DIAGNOSIS — C3412 Malignant neoplasm of upper lobe, left bronchus or lung: Secondary | ICD-10-CM | POA: Insufficient documentation

## 2018-09-18 MED ORDER — SODIUM CHLORIDE 0.9% FLUSH
10.0000 mL | Freq: Once | INTRAVENOUS | Status: AC
  Administered 2018-09-18: 10 mL via INTRAVENOUS

## 2018-09-18 NOTE — Progress Notes (Signed)
  Radiation Oncology         [phone] ________________________________  Name: Carolyn [name] MRN: [id number]  Date: 09/18/2018  DOB: Aug 29, 1955  SIMULATION AND TREATMENT PLANNING NOTE; SPECIAL TREATMENT PROCEDURE NOTE:  Outpatient  DIAGNOSIS:    ICD-10-CM   1. Brain metastases ([place])  C79.31     NARRATIVE:  The patient was brought to the [place].  Identity was confirmed.  All relevant records and images related to the planned course of therapy were reviewed.  The patient freely provided informed written consent to proceed with treatment after reviewing the details related to the planned course of therapy. The consent form was witnessed and verified by the simulation staff. Intravenous access was established for contrast administration. Then, the patient was set-up in a stable reproducible supine position for radiation therapy.  A relocatable thermoplastic stereotactic head frame was fabricated for precise immobilization.  CT images were obtained with IV contrast.  Surface markings were placed.  The CT images were loaded into the planning software and fused with the patient's targeting MRI scan.  Then the target and avoidance structures were contoured.  Treatment planning then occurred.  The radiation prescription was entered and confirmed.  I have requested 3D planning  I have requested a DVH of the following structures: Brain stem, brain, left eye, right eye, lenses, optic chiasm, target volumes, uninvolved brain, and normal tissue.    PLAN:  The patient will receive 20 Gy in 1 fraction to 9 metastases. Stereotactic radiosurgery will be used.  SPECIAL TREATMENT PROCEDURE NOTE:   This constitutes a special treatment procedure due to the ablative dose that will be delivered and the technical nature of treatment.  This highly technical modality of treatment ensures that the ablative dose is centered on the patient's tumor while sparing normal tissues from excessive dose  and risk of detrimental effects.   -----------------------------------  Eppie Gibson, MD

## 2018-09-19 DIAGNOSIS — C7931 Secondary malignant neoplasm of brain: Secondary | ICD-10-CM | POA: Diagnosis not present

## 2018-09-20 ENCOUNTER — Encounter (HOSPITAL_COMMUNITY): Payer: Self-pay | Admitting: Internal Medicine

## 2018-09-20 ENCOUNTER — Other Ambulatory Visit: Payer: Self-pay

## 2018-09-20 ENCOUNTER — Telehealth: Payer: Self-pay

## 2018-09-20 ENCOUNTER — Ambulatory Visit
Admission: RE | Admit: 2018-09-20 | Discharge: 2018-09-20 | Disposition: A | Discharge status: Home / Self Care | Payer: Medicare Other | Source: Ambulatory Visit | Attending: Radiation Oncology | Admitting: Radiation Oncology

## 2018-09-20 DIAGNOSIS — C3412 Malignant neoplasm of upper lobe, left bronchus or lung: Secondary | ICD-10-CM | POA: Diagnosis not present

## 2018-09-20 DIAGNOSIS — Z510 Encounter for antineoplastic radiation therapy: Secondary | ICD-10-CM | POA: Diagnosis not present

## 2018-09-20 DIAGNOSIS — C7931 Secondary malignant neoplasm of brain: Secondary | ICD-10-CM

## 2018-09-20 DIAGNOSIS — C7949 Secondary malignant neoplasm of other parts of nervous system: Secondary | ICD-10-CM

## 2018-09-20 LAB — BUN & CREATININE (CHCC)
BUN: 11 mg/dL (ref 8–23)
Creatinine: 1.13 mg/dL — ABNORMAL HIGH (ref 0.44–1.00)
GFR, Est AFR Am: 59 mL/min — ABNORMAL LOW (ref >60)
GFR, Estimated: 51 mL/min — ABNORMAL LOW (ref >60)

## 2018-09-20 NOTE — Telephone Encounter (Signed)
I called Carolyn [name] just now and informed her that she can start taking her metformin again this evening per Dr. Isidore Moos. She voiced her appreciation and knows to call me if she has any further concerns or questions.

## 2018-09-22 ENCOUNTER — Ambulatory Visit
Admission: RE | Admit: 2018-09-22 | Discharge: 2018-09-22 | Disposition: A | Discharge status: Home / Self Care | Payer: Medicare Other | Source: Ambulatory Visit | Attending: Radiation Oncology | Admitting: Radiation Oncology

## 2018-09-22 ENCOUNTER — Other Ambulatory Visit: Payer: Self-pay

## 2018-09-22 ENCOUNTER — Encounter: Payer: Self-pay | Admitting: Radiation Oncology

## 2018-09-22 VITALS — BP 154/77 | HR 58 | Temp 98.5°F

## 2018-09-22 DIAGNOSIS — C7931 Secondary malignant neoplasm of brain: Secondary | ICD-10-CM

## 2018-09-22 DIAGNOSIS — Z923 Personal history of irradiation: Secondary | ICD-10-CM

## 2018-09-22 DIAGNOSIS — Z510 Encounter for antineoplastic radiation therapy: Secondary | ICD-10-CM | POA: Diagnosis not present

## 2018-09-22 DIAGNOSIS — C3412 Malignant neoplasm of upper lobe, left bronchus or lung: Secondary | ICD-10-CM | POA: Diagnosis not present

## 2018-09-22 HISTORY — DX: Personal history of irradiation: Z92.3

## 2018-09-22 NOTE — Progress Notes (Signed)
  Radiation Oncology         [phone] ________________________________  Stereotactic Treatment Procedure Note Outpatient   ICD-10-CM   1. Brain metastases [facility])  C79.31     Name: Carolyn [name] MRN: [id number]  Date: 09/22/2018  DOB: 1956-01-01  SPECIAL TREATMENT PROCEDURE  3D TREATMENT PLANNING AND DOSIMETRY:  The patient's radiation plan was reviewed and approved by neurosurgery and radiation oncology prior to treatment.  It showed 3-dimensional radiation distributions overlaid onto the planning CT/MRI image set.  The [facility] for the target structures as well as the organs at risk were reviewed. The documentation of the 3D plan and dosimetry are filed in the radiation oncology EMR.  NARRATIVE:  Carolyn [name] was brought to the TrueBeam stereotactic radiation treatment machine and placed supine on the CT couch. The head frame was applied, and the patient was set up for stereotactic radiosurgery.  Neurosurgery was present for the set-up and delivery  SIMULATION VERIFICATION:  In the couch zero-angle position, the patient underwent Exactrac imaging using the Brainlab system with orthogonal KV images.  These were carefully aligned and repeated to confirm treatment position for each of the isocenters.  The Exactrac snap film verification was repeated at each couch angle.  SPECIAL TREATMENT PROCEDURE: Carolyn [name] received stereotactic radiosurgery to the following targets: ExacTrac, 6 vmat beams max dose = 127.8% PTV1 Ant Rt Frontal 35mm PTV2 Lt Frontal 35mm PTV3 Rt Frontal 33mm PTV4 Rt Parietal 42mm PTV5 Lt Parietal 67mm PTV6 Lt Parietal 73mm PTV7 Lt Temporal 67mm PTV8 Post Rt Frontal 33mm PTV9 Lt Sup Temporal 32mm  ExacTrac Snap verification was performed for each couch angle.  This constitutes a special treatment procedure due to the ablative dose delivered and the technical nature of treatment.  This highly technical modality of treatment ensures that the ablative dose is  centered on the patient's tumor while sparing normal tissues from excessive dose and risk of detrimental effects.  STEREOTACTIC TREATMENT MANAGEMENT:  Following delivery, the patient was transported to nursing in stable condition and monitored for possible acute effects.  Vital signs were recorded BP (!) 154/77 (BP Location: Left Arm, Patient Position: Sitting)   Pulse (!) 58   Temp 98.5 F (36.9 C) (Temporal)   SpO2 100% Comment: room air. The patient tolerated treatment without significant acute effects, and was discharged to home in stable condition.    PLAN: Follow-up in one month.  ________________________________   Eppie Gibson, MD

## 2018-09-27 DIAGNOSIS — C3492 Malignant neoplasm of unspecified part of left bronchus or lung: Secondary | ICD-10-CM | POA: Diagnosis not present

## 2018-09-28 ENCOUNTER — Telehealth: Payer: Self-pay | Admitting: Internal Medicine

## 2018-09-28 ENCOUNTER — Other Ambulatory Visit: Payer: Self-pay

## 2018-09-28 ENCOUNTER — Inpatient Hospital Stay: Payer: Medicare Other

## 2018-09-28 ENCOUNTER — Inpatient Hospital Stay (HOSPITAL_BASED_OUTPATIENT_CLINIC_OR_DEPARTMENT_OTHER): Payer: Medicare Other | Admitting: Internal Medicine

## 2018-09-28 ENCOUNTER — Encounter: Payer: Self-pay | Admitting: Internal Medicine

## 2018-09-28 VITALS — BP 148/65 | HR 65 | Temp 98.5°F | Resp 20 | Ht 60.5 in | Wt 178.9 lb

## 2018-09-28 DIAGNOSIS — R918 Other nonspecific abnormal finding of lung field: Secondary | ICD-10-CM

## 2018-09-28 DIAGNOSIS — Z7189 Other specified counseling: Secondary | ICD-10-CM | POA: Diagnosis not present

## 2018-09-28 DIAGNOSIS — R5382 Chronic fatigue, unspecified: Secondary | ICD-10-CM

## 2018-09-28 DIAGNOSIS — C3490 Malignant neoplasm of unspecified part of unspecified bronchus or lung: Secondary | ICD-10-CM | POA: Diagnosis not present

## 2018-09-28 DIAGNOSIS — I10 Essential (primary) hypertension: Secondary | ICD-10-CM | POA: Diagnosis not present

## 2018-09-28 DIAGNOSIS — Z5111 Encounter for antineoplastic chemotherapy: Secondary | ICD-10-CM

## 2018-09-28 DIAGNOSIS — Z79899 Other long term (current) drug therapy: Secondary | ICD-10-CM | POA: Diagnosis not present

## 2018-09-28 DIAGNOSIS — C7931 Secondary malignant neoplasm of brain: Secondary | ICD-10-CM

## 2018-09-28 DIAGNOSIS — Z5112 Encounter for antineoplastic immunotherapy: Secondary | ICD-10-CM | POA: Diagnosis not present

## 2018-09-28 DIAGNOSIS — I25118 Atherosclerotic heart disease of native coronary artery with other forms of angina pectoris: Secondary | ICD-10-CM

## 2018-09-28 DIAGNOSIS — C3412 Malignant neoplasm of upper lobe, left bronchus or lung: Secondary | ICD-10-CM | POA: Diagnosis not present

## 2018-09-28 LAB — CMP (CANCER CENTER ONLY)
ALT: 10 U/L (ref 0–44)
AST: 16 U/L (ref 15–41)
Albumin: 3.5 g/dL (ref 3.5–5.0)
Alkaline Phosphatase: 83 U/L (ref 38–126)
Anion gap: 13 (ref 5–15)
BUN: 13 mg/dL (ref 8–23)
CO2: 26 mmol/L (ref 22–32)
Calcium: 9.3 mg/dL (ref 8.9–10.3)
Chloride: 102 mmol/L (ref 98–111)
Creatinine: 0.92 mg/dL (ref 0.44–1.00)
GFR, Est AFR Am: >60 mL/min (ref >60)
GFR, Estimated: >60 mL/min (ref >60)
Glucose, Bld: 108 mg/dL — ABNORMAL HIGH (ref 70–99)
Potassium: 4.1 mmol/L (ref 3.5–5.1)
Sodium: 141 mmol/L (ref 135–145)
Total Bilirubin: 0.4 mg/dL (ref 0.3–1.2)
Total Protein: 7.5 g/dL (ref 6.5–8.1)

## 2018-09-28 LAB — CBC WITH DIFFERENTIAL (CANCER CENTER ONLY)
Abs Immature Granulocytes: 0.06 10*3/uL (ref 0.00–0.07)
Basophils Absolute: 0 10*3/uL (ref 0.0–0.1)
Basophils Relative: 0 %
Eosinophils Absolute: 0.2 10*3/uL (ref 0.0–0.5)
Eosinophils Relative: 3 %
HCT: 37.3 % (ref 36.0–46.0)
Hemoglobin: 11.1 g/dL — ABNORMAL LOW (ref 12.0–15.0)
Immature Granulocytes: 1 %
Lymphocytes Relative: 22 %
Lymphs Abs: 1.2 10*3/uL (ref 0.7–4.0)
MCH: 22.9 pg — ABNORMAL LOW (ref 26.0–34.0)
MCHC: 29.8 g/dL — ABNORMAL LOW (ref 30.0–36.0)
MCV: 77.1 fL — ABNORMAL LOW (ref 80.0–100.0)
Monocytes Absolute: 0.5 10*3/uL (ref 0.1–1.0)
Monocytes Relative: 8 %
Neutro Abs: 3.6 10*3/uL (ref 1.7–7.7)
Neutrophils Relative %: 66 %
Platelet Count: 315 10*3/uL (ref 150–400)
RBC: 4.84 MIL/uL (ref 3.87–5.11)
RDW: 16.6 % — ABNORMAL HIGH (ref 11.5–15.5)
WBC Count: 5.5 10*3/uL (ref 4.0–10.5)
nRBC: 0 % (ref 0.0–0.2)

## 2018-09-28 MED ORDER — FOLIC ACID 1 MG PO TABS: 1.0000 mg | ORAL_TABLET | Freq: Every day | ORAL | 4 refills | Status: DC

## 2018-09-28 MED ORDER — LIDOCAINE-PRILOCAINE 2.5-2.5 % EX CREA: TOPICAL_CREAM | CUTANEOUS | 0 refills | Status: DC

## 2018-09-28 MED ORDER — CYANOCOBALAMIN 1000 MCG/ML IJ SOLN
INTRAMUSCULAR | Status: AC
  Filled 2018-09-28: qty 1

## 2018-09-28 MED ORDER — PROCHLORPERAZINE MALEATE 10 MG PO TABS: 10.0000 mg | ORAL_TABLET | Freq: Four times a day (QID) | ORAL | 0 refills | Status: DC | PRN

## 2018-09-28 MED ORDER — CYANOCOBALAMIN 1000 MCG/ML IJ SOLN
1000.0000 ug | Freq: Once | INTRAMUSCULAR | Status: AC
  Administered 2018-09-28: 1000 ug via INTRAMUSCULAR

## 2018-09-28 NOTE — Progress Notes (Signed)
START ON PATHWAY REGIMEN - Non-Small Cell Lung     A cycle is every 21 days:     Pembrolizumab      Pemetrexed      Carboplatin   **Always confirm dose/schedule in your pharmacy ordering system**  Patient Characteristics: Stage IV Metastatic, Nonsquamous, Initial Chemotherapy/Immunotherapy, PS = 0, 1, ALK Rearrangement Negative and EGFR Mutation Negative/Non-Sensitizing, PD-L1 Expression Positive 1-49% (TPS) / Negative / Not Tested / Awaiting Test Results and  Immunotherapy Candidate AJCC T Category: T1c Current Disease Status: Distant Metastases AJCC N Category: N1 AJCC M Category: M1c AJCC 8 Stage Grouping: IVB Histology: Nonsquamous Cell ROS1 Rearrangement Status: Negative T790M Mutation Status: Not Applicable - EGFR Mutation Negative/Unknown Other Mutations/Biomarkers: No Other Actionable Mutations NTRK Gene Fusion Status: Negative PD-L1 Expression Status: PD-L1 Positive 1-49% (TPS) Chemotherapy/Immunotherapy LOT: Initial Chemotherapy/Immunotherapy Molecular Targeted Therapy: Not Appropriate ALK Rearrangement Status: Negative EGFR Mutation Status: Negative/Wild Type BRAF V600E Mutation Status: Negative ECOG Performance Status: 1 Immunotherapy Candidate Status: Candidate for Immunotherapy Intent of Therapy: Non-Curative / Palliative Intent, Discussed with Patient

## 2018-09-28 NOTE — Telephone Encounter (Signed)
Called patient regarding scheduled appointments per 08/20 los, patient is notified of upcoming appointments. Patient will be sent a calender.

## 2018-09-28 NOTE — Telephone Encounter (Signed)
Scheduled appointments per 08/20 los, patient received her avs and calender.

## 2018-09-28 NOTE — Progress Notes (Signed)
[place] [phone]   [phone]  OFFICE PROGRESS NOTE  [name], MD [address] [place] [address]  DIAGNOSIS: Stage IV (T1c, N1, M1c ) non-small cell lung cancer, adenocarcinoma diagnosed in July 2020 and presented with left upper lobe lung nodule in addition to right upper lobe lung nodule with left hilar lymphadenopathy as well as multiple brain metastasis.  Biomarker Findings Microsatellite status - MS-Stable Tumor Mutational Burden - 4 Muts/Mb Genomic Findings For a complete list of the genes assayed, please refer to the Appendix. ATM G204* KRAS G12C PDGFRA P122T SMO R199W 7 Disease relevant genes with no reportable alterations: ALK, BRAF, EGFR, ERBB2, MET, RET, ROS1  PDL 1 expression was 1%  PRIOR THERAPY: SBRT to multiple brain metastasis under the care of Dr. Isidore Moos.  CURRENT THERAPY: Systemic chemotherapy with carboplatin for AUC of 5, Alimta 500 mg/M2 and Keytruda 200 mg IV every 3 weeks.  First dose October 05, 2018.  INTERVAL HISTORY: Carolyn [name] 63 y.o. female returns to the clinic today for follow-up visit.  The patient is feeling fine today with no concerning complaints except for mild fatigue.  She denied having any chest pain, shortness of breath, cough or hemoptysis.  She denied having any fever or chills.  She has no nausea, vomiting, diarrhea or constipation.  She denied having any headache or visual changes.  She tolerated the SBRT to the brain lesion fairly well.  The patient had molecular studies by foundation 1 that showed no actionable mutations.  PDL 1 expression was 1%. She is here today for evaluation and discussion of her treatment options based on the recent studies.  MEDICAL HISTORY: Past Medical History:  Diagnosis Date  . Anginal pain ([place])    " with exertion "  . Arthritis    " 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 "  . Asthma    h/o  . Coronary artery disease   . Diabetes mellitus without  complication ([place])    Type II  . GERD (gastroesophageal reflux disease)   . H/O hiatal hernia   . Heart murmur   . 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 Hypertension   . Persistent headaches    h/o  . Pneumonia    hx of PNA  . Shortness of breath   . Vaginal dryness    h/o    ALLERGIES:  is allergic to lisinopril.  MEDICATIONS:  Current Outpatient Medications  Medication Sig Dispense Refill  . acetaminophen (TYLENOL) 500 MG tablet Take 1,000 mg by mouth every 8 (eight) hours as needed for mild pain, fever or headache.     Marland Kitchen amLODipine (NORVASC) 5 MG tablet Take 5 mg by mouth at bedtime.     Marland Kitchen aspirin EC 81 MG tablet Take 81 mg by mouth daily.    . carvedilol (COREG) 25 MG tablet Take 12.5 mg by mouth 2 (two) times daily with a meal.     . cetirizine (ZYRTEC) 10 MG tablet Take 10 mg by mouth daily.    . Cholecalciferol (VITAMIN D-3) 125 MCG (5000 UT) TABS Take 5,000 Units by mouth daily.    . Chromium Picolinate 1000 MCG TABS Take 1,000 mg by mouth daily.    Marland Kitchen co-enzyme Q-10 30 MG capsule Take 30 mg by mouth daily.     Marland Kitchen esomeprazole (NEXIUM) 20 MG capsule Take 20 mg by mouth daily at 12 noon.    . ferrous sulfate 325 (65  FE) MG tablet Take 325 mg by mouth daily with breakfast.    . Ginger, Zingiber officinalis, (GINGER ROOT) 500 MG CAPS Take 500 mg by mouth daily.    . hydrochlorothiazide (MICROZIDE) 12.5 MG capsule Take 12.5 mg by mouth daily.    Javier Docker Oil 1000 MG CAPS Take 1,000 mg by mouth daily.    . metFORMIN (GLUCOPHAGE) 500 MG tablet Take 500 mg by mouth 2 (two) times daily with a meal.     . methocarbamol (ROBAXIN) 500 MG tablet Take 1 tablet (500 mg total) by mouth every 6 (six) hours as needed for muscle spasms. 21 tablet 0  . nitroGLYCERIN (NITROSTAT) 0.4 MG SL tablet Place 1 tablet (0.4 mg total) under the tongue every 5 (five) minutes as needed for chest pain. (Patient not taking: Reported on 09/12/2018) 30 tablet 12  . ONETOUCH ULTRA test strip USE 1 STRIP TO CHECK GLUCOSE THREE  TIMES DAILY    . Pitavastatin Calcium (LIVALO) 4 MG TABS Take 4 mg by mouth at bedtime.     Vladimir Faster Glycol-Propyl Glycol (SYSTANE) 0.4-0.3 % SOLN Place 1 drop into both eyes every 6 (six) hours as needed (dry eyes).     . psyllium (METAMUCIL) 58.6 % packet Take 1 packet by mouth at bedtime as needed (constipation).     Marland Kitchen telmisartan (MICARDIS) 40 MG tablet Take 40 mg by mouth daily.    . Turmeric 500 MG CAPS Take 500 mg by mouth daily.    Marland Kitchen zolpidem (AMBIEN) 10 MG tablet Take 2.5 mg by mouth at bedtime.      No current facility-administered medications for this visit.     SURGICAL HISTORY:  Past Surgical History:  Procedure Laterality Date  . ABDOMINAL HYSTERECTOMY    . bone spur    . CARDIAC CATHETERIZATION  06/13/2012  . carpel tunnel surgery Left   . COLONOSCOPY     9 years ago  . CORONARY ANGIOPLASTY  06/13/2012  . EYE SURGERY     cyst left eye   . LEFT HEART CATHETERIZATION WITH CORONARY ANGIOGRAM N/A 06/13/2012   Procedure: LEFT HEART CATHETERIZATION WITH CORONARY ANGIOGRAM;  Surgeon: Laverda Page, MD;  Location: [facility] CATH LAB;  Service: Cardiovascular;  Laterality: N/A;  . NECK SURGERY    . rotator cuff surgery Right 2015  . VIDEO BRONCHOSCOPY WITH ENDOBRONCHIAL NAVIGATION N/A 09/06/2018   Procedure: VIDEO BRONCHOSCOPY WITH ENDOBRONCHIAL NAVIGATION, left upper lung;  Surgeon: Lajuana Matte, MD;  Location: [place];  Service: Thoracic;  Laterality: N/A;  . VIDEO BRONCHOSCOPY WITH ENDOBRONCHIAL ULTRASOUND Left 09/06/2018   Procedure: VIDEO BRONCHOSCOPY WITH ENDOBRONCHIAL ULTRASOUND, left lung;  Surgeon: Lajuana Matte, MD;  Location: [place];  Service: Thoracic;  Laterality: Left;  . WISDOM TOOTH EXTRACTION      REVIEW OF SYSTEMS:  Constitutional: positive for fatigue Eyes: negative Ears, nose, mouth, throat, and face: negative Respiratory: negative Cardiovascular: negative Gastrointestinal: negative Genitourinary:negative Integument/breast:  negative Hematologic/lymphatic: negative Musculoskeletal:negative Neurological: negative Behavioral/Psych: negative Endocrine: negative Allergic/Immunologic: negative   PHYSICAL EXAMINATION: General appearance: alert, cooperative, fatigued and no distress Head: Normocephalic, without obvious abnormality, atraumatic Neck: no adenopathy, no JVD, supple, symmetrical, trachea midline and thyroid not enlarged, symmetric, no tenderness/mass/nodules Lymph nodes: Cervical, supraclavicular, and axillary nodes normal. Resp: clear to auscultation bilaterally Back: symmetric, no curvature. ROM normal. No CVA tenderness. Cardio: regular rate and rhythm, S1, S2 normal, no murmur, click, rub or gallop GI: soft, non-tender; bowel sounds normal; no masses,  no organomegaly Extremities: extremities  normal, atraumatic, no cyanosis or edema Neurologic: Alert and oriented X 3, normal strength and tone. Normal symmetric reflexes. Normal coordination and gait  ECOG PERFORMANCE STATUS: 1 - Symptomatic but completely ambulatory  Blood pressure (!) 148/65, pulse 65, temperature 98.5 F (36.9 C), resp. rate 20, height 5' 0.5" (1.537 m), weight 178 lb 14.4 oz (81.1 kg), SpO2 100 %.  LABORATORY DATA: Lab Results  Component Value Date   WBC 7.9 09/12/2018   HGB 11.2 (L) 09/12/2018   HCT 37.8 09/12/2018   MCV 77.3 (L) 09/12/2018   PLT 373 09/12/2018      Chemistry      Component Value Date/Time   NA 141 09/12/2018 1049   K 3.8 09/12/2018 1049   CL 103 09/12/2018 1049   CO2 27 09/12/2018 1049   BUN 11 09/20/2018 1205   CREATININE 1.13 (H) 09/20/2018 1205      Component Value Date/Time   CALCIUM 9.5 09/12/2018 1049   ALKPHOS 85 09/12/2018 1049   AST 16 09/12/2018 1049   ALT 14 09/12/2018 1049   BILITOT 0.3 09/12/2018 1049       RADIOGRAPHIC STUDIES: Dg Chest 2 View  Result Date: 09/05/2018 CLINICAL DATA:  Left lung nodule. Former smoker. EXAM: CHEST - 2 VIEW COMPARISON:  07/30/2018 FINDINGS:  The heart size and mediastinal contours are within normal limits. Aortic atherosclerosis. 3 cm spiculated pulmonary nodule in the left upper lobe shows no significant change compared to recent exam. The lungs are otherwise clear. No evidence of pleural effusion. No definite lymphadenopathy identified. IMPRESSION: Stable 3 cm spiculated left upper lobe pulmonary nodule, highly suspicious for bronchogenic carcinoma. Electronically Signed   By: Marlaine Hind M.D.   On: 09/05/2018 08:15   Mr Jeri Cos YP Contrast  Addendum Date: 09/18/2018   ADDENDUM REPORT: 09/18/2018 13:24 ADDENDUM: The study was reviewed via telephone with Dr. Isidore Moos on 09/18/2018 at 1:15 p.m. An additional punctate, subtly enhancing lesion posteriorly in the left superior temporal gyrus measures 1-2 mm (series 9, image 74 and series 10, image 21). Electronically Signed   By: Logan Bores M.D.   On: 09/18/2018 13:24   Result Date: 09/18/2018 CLINICAL DATA:  Non-small cell lung cancer with brain metastases. EXAM: MRI HEAD WITHOUT AND WITH CONTRAST TECHNIQUE: Multiplanar, multiecho pulse sequences of the brain and surrounding structures were obtained without and with intravenous contrast. CONTRAST:  8 mL Gadavist COMPARISON:  08/28/2018 FINDINGS: Brain: Multiple small enhancing brain lesions as follows (with references to series 9): 1. 3 mm left temporal lobe (image 66) 2. 2 mm left periatrial white matter (image 83) 3. 3 mm left parietal lobe (image 83) 4. 3 mm right parietal lobe (image 109) 5. 6 mm right frontal lobe (image 114) 6. 2 mm posterior left frontal lobe (image 121) 7. 2 mm posterior right frontal lobe (image 125) 8. 5 mm high anterior right frontal lobe (image 130) Some of these lesions have mildly enlarged while others were not well demonstrated on the prior 1.5 T study which was mildly motion degraded. There is mild edema associated with the 6 mm right frontal lesion which has increased without mass effect. At most minimal edema is  associated with other lesions. A band of T2 FLAIR hyperintensity in the posterior left cerebellum is unchanged without an underlying enhancing lesion. No acute infarct, intracranial hemorrhage, midline shift, or extra-axial fluid collection is identified. The ventricles and sulci are normal. Vascular: Major intracranial vascular flow voids are preserved. Skull and upper cervical spine: No  suspicious marrow lesion. Anterior cervical spine fusion. Sinuses/Orbits: Unremarkable orbits. Paranasal sinuses and mastoid air cells are clear. Other: None. IMPRESSION: 8 brain metastases with at most mild edema. Electronically Signed: By: Logan Bores M.D. On: 09/15/2018 17:17   Dg C-arm Bronchoscopy  Result Date: 09/06/2018 C-ARM BRONCHOSCOPY: Fluoroscopy was utilized by the requesting physician.  No radiographic interpretation.    ASSESSMENT AND PLAN: This is a very pleasant 63 years old African-American female with likely stage IV (T1c, N1, M1C) lung cancer pending tissue diagnosis and presented with left upper lobe lung nodule in addition to right upper lobe pulmonary nodule as well as left hilar adenopathy and metastatic lesion to the brain. Molecular studies showed no actionable mutation and PDL 1 expression was 1%. The patient underwent SBRT to the metastatic brain lesion under the care of Dr. Isidore Moos. I had a lengthy discussion with the patient today about her current disease stage, prognosis and treatment options. I explained to the patient that she has incurable condition and all the treatment will be of palliative nature.  She was given the option of palliative care versus palliative systemic chemotherapy with carboplatin for AUC of 5, Alimta 500 mg/M2 and Keytruda 200 mg IV every 3 weeks.  I discussed with the patient the adverse effect of this treatment including but not limited to alopecia, myelosuppression, nausea and vomiting, peripheral neuropathy, liver or renal dysfunction as well as immunotherapy  mediated adverse effects. The patient would like to proceed with treatment as planned and she is expected to start the first cycle of this treatment on October 05, 2018. She will receive vitamin B12 injection today. I will send a prescription to her pharmacy with folic acid 1 mg p.o. daily in addition to Compazine 10 mg p.o. every 6 hours as needed for nausea and EMLA cream to be applied to the Port-A-Cath site before treatment. I will refer the patient to interventional radiology for Port-A-Cath placement.  She will also have a chemotherapy education class before the first dose of her treatment. For hypertension we will strongly recommend for the patient to take her blood pressure medication as prescribed and to consult with her primary care physician for adjustment of her medication if needed. The patient will come back for follow-up visit in 2 weeks for evaluation and management of any adverse effect of her treatment. The patient was advised to call immediately if she has any concerning symptoms in the interval. The patient voices understanding of current disease status and treatment options and is in agreement with the current care plan.  All questions were answered. The patient knows to call the clinic with any problems, questions or concerns. We can certainly see the patient much sooner if necessary.  I spent 15 minutes counseling the patient face to face. The total time spent in the appointment was 25 minutes.  Disclaimer: This note was dictated with voice recognition software. Similar sounding words can inadvertently be transcribed and may not be corrected upon review.

## 2018-10-02 ENCOUNTER — Inpatient Hospital Stay: Payer: Medicare Other

## 2018-10-02 ENCOUNTER — Other Ambulatory Visit: Payer: Self-pay

## 2018-10-05 ENCOUNTER — Inpatient Hospital Stay: Payer: Medicare Other

## 2018-10-05 ENCOUNTER — Other Ambulatory Visit: Payer: Self-pay

## 2018-10-05 VITALS — BP 134/67 | HR 55 | Temp 99.1°F | Resp 18

## 2018-10-05 DIAGNOSIS — C7931 Secondary malignant neoplasm of brain: Secondary | ICD-10-CM | POA: Diagnosis not present

## 2018-10-05 DIAGNOSIS — C3412 Malignant neoplasm of upper lobe, left bronchus or lung: Secondary | ICD-10-CM | POA: Diagnosis not present

## 2018-10-05 DIAGNOSIS — C3492 Malignant neoplasm of unspecified part of left bronchus or lung: Secondary | ICD-10-CM

## 2018-10-05 DIAGNOSIS — Z79899 Other long term (current) drug therapy: Secondary | ICD-10-CM | POA: Diagnosis not present

## 2018-10-05 DIAGNOSIS — C3490 Malignant neoplasm of unspecified part of unspecified bronchus or lung: Secondary | ICD-10-CM

## 2018-10-05 DIAGNOSIS — Z5111 Encounter for antineoplastic chemotherapy: Secondary | ICD-10-CM | POA: Diagnosis not present

## 2018-10-05 DIAGNOSIS — Z5112 Encounter for antineoplastic immunotherapy: Secondary | ICD-10-CM | POA: Diagnosis not present

## 2018-10-05 DIAGNOSIS — R5382 Chronic fatigue, unspecified: Secondary | ICD-10-CM

## 2018-10-05 LAB — CMP (CANCER CENTER ONLY)
ALT: 9 U/L (ref 0–44)
AST: 15 U/L (ref 15–41)
Albumin: 3.4 g/dL — ABNORMAL LOW (ref 3.5–5.0)
Alkaline Phosphatase: 85 U/L (ref 38–126)
Anion gap: 10 (ref 5–15)
BUN: 9 mg/dL (ref 8–23)
CO2: 26 mmol/L (ref 22–32)
Calcium: 9.3 mg/dL (ref 8.9–10.3)
Chloride: 106 mmol/L (ref 98–111)
Creatinine: 0.96 mg/dL (ref 0.44–1.00)
GFR, Est AFR Am: >60 mL/min (ref >60)
GFR, Estimated: >60 mL/min (ref >60)
Glucose, Bld: 142 mg/dL — ABNORMAL HIGH (ref 70–99)
Potassium: 3.7 mmol/L (ref 3.5–5.1)
Sodium: 142 mmol/L (ref 135–145)
Total Bilirubin: 0.3 mg/dL (ref 0.3–1.2)
Total Protein: 7.2 g/dL (ref 6.5–8.1)

## 2018-10-05 LAB — TSH: TSH: 1.056 u[IU]/mL (ref 0.308–3.960)

## 2018-10-05 LAB — CBC WITH DIFFERENTIAL (CANCER CENTER ONLY)
Abs Immature Granulocytes: 0.01 10*3/uL (ref 0.00–0.07)
Basophils Absolute: 0 10*3/uL (ref 0.0–0.1)
Basophils Relative: 1 %
Eosinophils Absolute: 0.2 10*3/uL (ref 0.0–0.5)
Eosinophils Relative: 3 %
HCT: 35.4 % — ABNORMAL LOW (ref 36.0–46.0)
Hemoglobin: 10.9 g/dL — ABNORMAL LOW (ref 12.0–15.0)
Immature Granulocytes: 0 %
Lymphocytes Relative: 21 %
Lymphs Abs: 1.2 10*3/uL (ref 0.7–4.0)
MCH: 23.2 pg — ABNORMAL LOW (ref 26.0–34.0)
MCHC: 30.8 g/dL (ref 30.0–36.0)
MCV: 75.5 fL — ABNORMAL LOW (ref 80.0–100.0)
Monocytes Absolute: 0.6 10*3/uL (ref 0.1–1.0)
Monocytes Relative: 9 %
Neutro Abs: 4 10*3/uL (ref 1.7–7.7)
Neutrophils Relative %: 66 %
Platelet Count: 319 10*3/uL (ref 150–400)
RBC: 4.69 MIL/uL (ref 3.87–5.11)
RDW: 16.5 % — ABNORMAL HIGH (ref 11.5–15.5)
WBC Count: 6 10*3/uL (ref 4.0–10.5)
nRBC: 0 % (ref 0.0–0.2)

## 2018-10-05 MED ORDER — SODIUM CHLORIDE 0.9 % IV SOLN
442.5000 mg | Freq: Once | INTRAVENOUS | Status: AC
  Administered 2018-10-05: 440 mg via INTRAVENOUS
  Filled 2018-10-05: qty 44

## 2018-10-05 MED ORDER — PALONOSETRON HCL INJECTION 0.25 MG/5ML
INTRAVENOUS | Status: AC
  Filled 2018-10-05: qty 5

## 2018-10-05 MED ORDER — SODIUM CHLORIDE 0.9 % IV SOLN
200.0000 mg | Freq: Once | INTRAVENOUS | Status: AC
  Administered 2018-10-05: 200 mg via INTRAVENOUS
  Filled 2018-10-05: qty 8

## 2018-10-05 MED ORDER — PALONOSETRON HCL INJECTION 0.25 MG/5ML
0.2500 mg | Freq: Once | INTRAVENOUS | Status: AC
  Administered 2018-10-05: 0.25 mg via INTRAVENOUS

## 2018-10-05 MED ORDER — SODIUM CHLORIDE 0.9 % IV SOLN
Freq: Once | INTRAVENOUS | Status: AC
  Administered 2018-10-05: 16:00:00 via INTRAVENOUS
  Filled 2018-10-05: qty 5

## 2018-10-05 MED ORDER — SODIUM CHLORIDE 0.9 % IV SOLN
485.0000 mg/m2 | Freq: Once | INTRAVENOUS | Status: AC
  Administered 2018-10-05: 900 mg via INTRAVENOUS
  Filled 2018-10-05: qty 16

## 2018-10-05 MED ORDER — SODIUM CHLORIDE 0.9 % IV SOLN
Freq: Once | INTRAVENOUS | Status: AC
  Administered 2018-10-05: 15:00:00 via INTRAVENOUS
  Filled 2018-10-05: qty 250

## 2018-10-05 NOTE — Patient Instructions (Signed)
[facility] Discharge Instructions for Patients Receiving Chemotherapy  Today you received the following chemotherapy agents Keytruda, Alimta, and Carboplatin  To help prevent nausea and vomiting after your treatment, we encourage you to take your nausea medication as directed.   If you develop nausea and vomiting that is not controlled by your nausea medication, call the clinic.   BELOW ARE SYMPTOMS THAT SHOULD BE REPORTED IMMEDIATELY:  *FEVER GREATER THAN 100.5 F  *CHILLS WITH OR WITHOUT FEVER  NAUSEA AND VOMITING THAT IS NOT CONTROLLED WITH YOUR NAUSEA MEDICATION  *UNUSUAL SHORTNESS OF BREATH  *UNUSUAL BRUISING OR BLEEDING  TENDERNESS IN MOUTH AND THROAT WITH OR WITHOUT PRESENCE OF ULCERS  *URINARY PROBLEMS  *BOWEL PROBLEMS  UNUSUAL RASH Items with * indicate a potential emergency and should be followed up as soon as possible.  Feel free to call the clinic should you have any questions or concerns. The clinic phone number is [phone].  Please show the [facility] at check-in to the Emergency Department and triage nurse.  Pembrolizumab injection(Keytruda) What is this medicine? PEMBROLIZUMAB (pem broe liz ue mab) is a monoclonal antibody. It is used to treat bladder cancer, cervical cancer, endometrial cancer, esophageal cancer, head and neck cancer, hepatocellular cancer, Hodgkin lymphoma, kidney cancer, lymphoma, melanoma, Merkel cell carcinoma, lung cancer, stomach cancer, urothelial cancer, and cancers that have a certain genetic condition. This medicine may be used for other purposes; ask your health care provider or pharmacist if you have questions. COMMON BRAND NAME(S): Keytruda What should I tell my health care provider before I take this medicine? They need to know if you have any of these conditions:  diabetes  immune system problems  inflammatory bowel disease  liver disease  lung or breathing disease  lupus  received or  scheduled to receive an organ transplant or a stem-cell transplant that uses donor stem cells  an unusual or allergic reaction to pembrolizumab, other medicines, foods, dyes, or preservatives  pregnant or trying to get pregnant  breast-feeding How should I use this medicine? This medicine is for infusion into a vein. It is given by a health care professional in a hospital or clinic setting. A special MedGuide will be given to you before each treatment. Be sure to read this information carefully each time. Talk to your pediatrician regarding the use of this medicine in children. While this drug may be prescribed for selected conditions, precautions do apply. Overdosage: If you think you have taken too much of this medicine contact a poison control center or emergency room at once. NOTE: This medicine is only for you. Do not share this medicine with others. What if I miss a dose? It is important not to miss your dose. Call your doctor or health care professional if you are unable to keep an appointment. What may interact with this medicine? Interactions have not been studied. Give your health care provider a list of all the medicines, herbs, non-prescription drugs, or dietary supplements you use. Also tell them if you smoke, drink alcohol, or use illegal drugs. Some items may interact with your medicine. This list may not describe all possible interactions. Give your health care provider a list of all the medicines, herbs, non-prescription drugs, or dietary supplements you use. Also tell them if you smoke, drink alcohol, or use illegal drugs. Some items may interact with your medicine. What should I watch for while using this medicine? Your condition will be monitored carefully while you are receiving this medicine.  You may need blood work done while you are taking this medicine. Do not become pregnant while taking this medicine or for 4 months after stopping it. Women should inform their doctor  if they wish to become pregnant or think they might be pregnant. There is a potential for serious side effects to an unborn child. Talk to your health care professional or pharmacist for more information. Do not breast-feed an infant while taking this medicine or for 4 months after the last dose. What side effects may I notice from receiving this medicine? Side effects that you should report to your doctor or health care professional as soon as possible:  allergic reactions like skin rash, itching or hives, swelling of the face, lips, or tongue  bloody or black, tarry  breathing problems  changes in vision  chest pain  chills  confusion  constipation  cough  diarrhea  dizziness or feeling faint or lightheaded  fast or irregular heartbeat  fever  flushing  hair loss  joint pain  low blood counts - this medicine may decrease the number of white blood cells, red blood cells and platelets. You may be at increased risk for infections and bleeding.  muscle pain  muscle weakness  persistent headache  redness, blistering, peeling or loosening of the skin, including inside the mouth  signs and symptoms of high blood sugar such as dizziness; dry mouth; dry skin; fruity breath; nausea; stomach pain; increased hunger or thirst; increased urination  signs and symptoms of kidney injury like trouble passing urine or change in the amount of urine  signs and symptoms of liver injury like dark urine, light-colored stools, loss of appetite, nausea, right upper belly pain, yellowing of the eyes or skin  sweating  swollen lymph nodes  weight loss Side effects that usually do not require medical attention (report to your doctor or health care professional if they continue or are bothersome):  decreased appetite  muscle pain  tiredness This list may not describe all possible side effects. Call your doctor for medical advice about side effects. You may report side effects to  FDA at 1-800-FDA-1088. Where should I keep my medicine? This drug is given in a hospital or clinic and will not be stored at home. NOTE: This sheet is a summary. It may not cover all possible information. If you have questions about this medicine, talk to your doctor, pharmacist, or health care provider.  2020 Elsevier/Gold Standard (2018-02-21 13:46:58)   Pemetrexed injection(Alimta) What is this medicine? PEMETREXED (PEM e TREX ed) is a chemotherapy drug used to treat lung cancers like non-small cell lung cancer and mesothelioma. It may also be used to treat other cancers. This medicine may be used for other purposes; ask your health care provider or pharmacist if you have questions. COMMON BRAND NAME(S): Alimta What should I tell my health care provider before I take this medicine? They need to know if you have any of these conditions:  infection (especially a virus infection such as chickenpox, cold sores, or herpes)  kidney disease  low blood counts, like low white cell, platelet, or red cell counts  lung or breathing disease, like asthma  radiation therapy  an unusual or allergic reaction to pemetrexed, other medicines, foods, dyes, or preservative  pregnant or trying to get pregnant  breast-feeding How should I use this medicine? This drug is given as an infusion into a vein. It is administered in a hospital or clinic by a specially trained health care professional.  Talk to your pediatrician regarding the use of this medicine in children. Special care may be needed. Overdosage: If you think you have taken too much of this medicine contact a poison control center or emergency room at once. NOTE: This medicine is only for you. Do not share this medicine with others. What if I miss a dose? It is important not to miss your dose. Call your doctor or health care professional if you are unable to keep an appointment. What may interact with this medicine? This medicine may  interact with the following medications:  Ibuprofen This list may not describe all possible interactions. Give your health care provider a list of all the medicines, herbs, non-prescription drugs, or dietary supplements you use. Also tell them if you smoke, drink alcohol, or use illegal drugs. Some items may interact with your medicine. What should I watch for while using this medicine? Visit your doctor for checks on your progress. This drug may make you feel generally unwell. This is not uncommon, as chemotherapy can affect healthy cells as well as cancer cells. Report any side effects. Continue your course of treatment even though you feel ill unless your doctor tells you to stop. In some cases, you may be given additional medicines to help with side effects. Follow all directions for their use. Call your doctor or health care professional for advice if you get a fever, chills or sore throat, or other symptoms of a cold or flu. Do not treat yourself. This drug decreases your body's ability to fight infections. Try to avoid being around people who are sick. This medicine may increase your risk to bruise or bleed. Call your doctor or health care professional if you notice any unusual bleeding. Be careful brushing and flossing your teeth or using a toothpick because you may get an infection or bleed more easily. If you have any dental work done, tell your dentist you are receiving this medicine. Avoid taking products that contain aspirin, acetaminophen, ibuprofen, naproxen, or ketoprofen unless instructed by your doctor. These medicines may hide a fever. Call your doctor or health care professional if you get diarrhea or mouth sores. Do not treat yourself. To protect your kidneys, drink water or other fluids as directed while you are taking this medicine. Do not become pregnant while taking this medicine or for 6 months after stopping it. Women should inform their doctor if they wish to become pregnant  or think they might be pregnant. Men should not father a child while taking this medicine and for 3 months after stopping it. This may interfere with the ability to father a child. You should talk to your doctor or health care professional if you are concerned about your fertility. There is a potential for serious side effects to an unborn child. Talk to your health care professional or pharmacist for more information. Do not breast-feed an infant while taking this medicine or for 1 week after stopping it. What side effects may I notice from receiving this medicine? Side effects that you should report to your doctor or health care professional as soon as possible:  allergic reactions like skin rash, itching or hives, swelling of the face, lips, or tongue  breathing problems  redness, blistering, peeling or loosening of the skin, including inside the mouth  signs and symptoms of bleeding such as bloody or black, tarry stools; red or dark-brown urine; spitting up blood or brown material that looks like coffee grounds; red spots on the skin; unusual bruising  or bleeding from the eye, gums, or nose  signs and symptoms of infection like fever or chills; cough; sore throat; pain or trouble passing urine  signs and symptoms of kidney injury like trouble passing urine or change in the amount of urine  signs and symptoms of liver injury like dark yellow or brown urine; general ill feeling or flu-like symptoms; light-colored stools; loss of appetite; nausea; right upper belly pain; unusually weak or tired; yellowing of the eyes or skin Side effects that usually do not require medical attention (report to your doctor or health care professional if they continue or are bothersome):  constipation  mouth sores  nausea, vomiting  unusually weak or tired This list may not describe all possible side effects. Call your doctor for medical advice about side effects. You may report side effects to FDA at  1-800-FDA-1088. Where should I keep my medicine? This drug is given in a hospital or clinic and will not be stored at home. NOTE: This sheet is a summary. It may not cover all possible information. If you have questions about this medicine, talk to your doctor, pharmacist, or health care provider.  2020 Elsevier/Gold Standard (2017-03-16 16:11:33)   Carboplatin injection What is this medicine? CARBOPLATIN (KAR boe pla tin) is a chemotherapy drug. It targets fast dividing cells, like cancer cells, and causes these cells to die. This medicine is used to treat ovarian cancer and many other cancers. This medicine may be used for other purposes; ask your health care provider or pharmacist if you have questions. COMMON BRAND NAME(S): Paraplatin What should I tell my health care provider before I take this medicine? They need to know if you have any of these conditions:  blood disorders  hearing problems  kidney disease  recent or ongoing radiation therapy  an unusual or allergic reaction to carboplatin, cisplatin, other chemotherapy, other medicines, foods, dyes, or preservatives  pregnant or trying to get pregnant  breast-feeding How should I use this medicine? This drug is usually given as an infusion into a vein. It is administered in a hospital or clinic by a specially trained health care professional. Talk to your pediatrician regarding the use of this medicine in children. Special care may be needed. Overdosage: If you think you have taken too much of this medicine contact a poison control center or emergency room at once. NOTE: This medicine is only for you. Do not share this medicine with others. What if I miss a dose? It is important not to miss a dose. Call your doctor or health care professional if you are unable to keep an appointment. What may interact with this medicine?  medicines for seizures  medicines to increase blood counts like filgrastim, pegfilgrastim,  sargramostim  some antibiotics like amikacin, gentamicin, neomycin, streptomycin, tobramycin  vaccines Talk to your doctor or health care professional before taking any of these medicines:  acetaminophen  aspirin  ibuprofen  ketoprofen  naproxen This list may not describe all possible interactions. Give your health care provider a list of all the medicines, herbs, non-prescription drugs, or dietary supplements you use. Also tell them if you smoke, drink alcohol, or use illegal drugs. Some items may interact with your medicine. What should I watch for while using this medicine? Your condition will be monitored carefully while you are receiving this medicine. You will need important blood work done while you are taking this medicine. This drug may make you feel generally unwell. This is not uncommon, as chemotherapy can  affect healthy cells as well as cancer cells. Report any side effects. Continue your course of treatment even though you feel ill unless your doctor tells you to stop. In some cases, you may be given additional medicines to help with side effects. Follow all directions for their use. Call your doctor or health care professional for advice if you get a fever, chills or sore throat, or other symptoms of a cold or flu. Do not treat yourself. This drug decreases your body's ability to fight infections. Try to avoid being around people who are sick. This medicine may increase your risk to bruise or bleed. Call your doctor or health care professional if you notice any unusual bleeding. Be careful brushing and flossing your teeth or using a toothpick because you may get an infection or bleed more easily. If you have any dental work done, tell your dentist you are receiving this medicine. Avoid taking products that contain aspirin, acetaminophen, ibuprofen, naproxen, or ketoprofen unless instructed by your doctor. These medicines may hide a fever. Do not become pregnant while taking  this medicine. Women should inform their doctor if they wish to become pregnant or think they might be pregnant. There is a potential for serious side effects to an unborn child. Talk to your health care professional or pharmacist for more information. Do not breast-feed an infant while taking this medicine. What side effects may I notice from receiving this medicine? Side effects that you should report to your doctor or health care professional as soon as possible:  allergic reactions like skin rash, itching or hives, swelling of the face, lips, or tongue  signs of infection - fever or chills, cough, sore throat, pain or difficulty passing urine  signs of decreased platelets or bleeding - bruising, pinpoint red spots on the skin, black, tarry stools, nosebleeds  signs of decreased red blood cells - unusually weak or tired, fainting spells, lightheadedness  breathing problems  changes in hearing  changes in vision  chest pain  high blood pressure  low blood counts - This drug may decrease the number of white blood cells, red blood cells and platelets. You may be at increased risk for infections and bleeding.  nausea and vomiting  pain, swelling, redness or irritation at the injection site  pain, tingling, numbness in the hands or feet  problems with balance, talking, walking  trouble passing urine or change in the amount of urine Side effects that usually do not require medical attention (report to your doctor or health care professional if they continue or are bothersome):  hair loss  loss of appetite  metallic taste in the mouth or changes in taste This list may not describe all possible side effects. Call your doctor for medical advice about side effects. You may report side effects to FDA at 1-800-FDA-1088. Where should I keep my medicine? This drug is given in a hospital or clinic and will not be stored at home. NOTE: This sheet is a summary. It may not cover all  possible information. If you have questions about this medicine, talk to your doctor, pharmacist, or health care provider.  2020 Elsevier/Gold Standard (2007-05-02 14:38:05)

## 2018-10-06 ENCOUNTER — Telehealth: Payer: Self-pay | Admitting: *Deleted

## 2018-10-06 ENCOUNTER — Ambulatory Visit: Payer: Medicare Other

## 2018-10-06 ENCOUNTER — Other Ambulatory Visit: Payer: Medicare Other

## 2018-10-06 NOTE — Telephone Encounter (Signed)
-----   Message from Carolyn Bake, RN sent at 10/05/2018  5:30 PM EDT ----- Regarding: Dr.Mohamed pt. first time [place], Carolyn [name] and Carolyn [name] Dr. Julien Nordmann patient first time [place], Carolyn [name], and Carolyn [name]

## 2018-10-06 NOTE — Telephone Encounter (Signed)
Called & left message for pt to return call to discuss recent 1st treatment of keytruda, alimta, & carboplatin.

## 2018-10-08 ENCOUNTER — Other Ambulatory Visit: Payer: Self-pay | Admitting: Radiology

## 2018-10-09 ENCOUNTER — Other Ambulatory Visit: Payer: Self-pay | Admitting: Radiology

## 2018-10-10 ENCOUNTER — Ambulatory Visit (HOSPITAL_COMMUNITY)
Admission: RE | Admit: 2018-10-10 | Discharge: 2018-10-10 | Disposition: A | Discharge status: Home / Self Care | Payer: Medicare Other | Source: Ambulatory Visit | Attending: Internal Medicine | Admitting: Internal Medicine

## 2018-10-10 ENCOUNTER — Encounter (HOSPITAL_COMMUNITY): Payer: Self-pay | Admitting: Interventional Radiology

## 2018-10-10 ENCOUNTER — Other Ambulatory Visit: Payer: Self-pay

## 2018-10-10 ENCOUNTER — Other Ambulatory Visit: Payer: Self-pay | Admitting: Internal Medicine

## 2018-10-10 DIAGNOSIS — M1990 Unspecified osteoarthritis, unspecified site: Secondary | ICD-10-CM | POA: Diagnosis not present

## 2018-10-10 DIAGNOSIS — I2510 Atherosclerotic heart disease of native coronary artery without angina pectoris: Secondary | ICD-10-CM | POA: Insufficient documentation

## 2018-10-10 DIAGNOSIS — E119 Type 2 diabetes mellitus without complications: Secondary | ICD-10-CM | POA: Diagnosis not present

## 2018-10-10 DIAGNOSIS — K219 Gastro-esophageal reflux disease without esophagitis: Secondary | ICD-10-CM | POA: Insufficient documentation

## 2018-10-10 DIAGNOSIS — I10 Essential (primary) hypertension: Secondary | ICD-10-CM | POA: Diagnosis not present

## 2018-10-10 DIAGNOSIS — Z87891 Personal history of nicotine dependence: Secondary | ICD-10-CM | POA: Diagnosis not present

## 2018-10-10 DIAGNOSIS — Z7984 Long term (current) use of oral hypoglycemic drugs: Secondary | ICD-10-CM | POA: Diagnosis not present

## 2018-10-10 DIAGNOSIS — Z5111 Encounter for antineoplastic chemotherapy: Secondary | ICD-10-CM | POA: Diagnosis not present

## 2018-10-10 DIAGNOSIS — Z79899 Other long term (current) drug therapy: Secondary | ICD-10-CM | POA: Insufficient documentation

## 2018-10-10 DIAGNOSIS — Z7982 Long term (current) use of aspirin: Secondary | ICD-10-CM | POA: Diagnosis not present

## 2018-10-10 DIAGNOSIS — C3490 Malignant neoplasm of unspecified part of unspecified bronchus or lung: Secondary | ICD-10-CM | POA: Insufficient documentation

## 2018-10-10 DIAGNOSIS — Z452 Encounter for adjustment and management of vascular access device: Secondary | ICD-10-CM | POA: Diagnosis not present

## 2018-10-10 DIAGNOSIS — C7931 Secondary malignant neoplasm of brain: Secondary | ICD-10-CM | POA: Diagnosis not present

## 2018-10-10 DIAGNOSIS — C801 Malignant (primary) neoplasm, unspecified: Secondary | ICD-10-CM | POA: Diagnosis not present

## 2018-10-10 HISTORY — PX: IR IMAGING GUIDED PORT INSERTION: IMG5740

## 2018-10-10 LAB — CBC WITH DIFFERENTIAL/PLATELET
Abs Immature Granulocytes: 0.01 10*3/uL (ref 0.00–0.07)
Basophils Absolute: 0 10*3/uL (ref 0.0–0.1)
Basophils Relative: 0 %
Eosinophils Absolute: 0.2 10*3/uL (ref 0.0–0.5)
Eosinophils Relative: 5 %
HCT: 38.3 % (ref 36.0–46.0)
Hemoglobin: 11.5 g/dL — ABNORMAL LOW (ref 12.0–15.0)
Immature Granulocytes: 0 %
Lymphocytes Relative: 34 %
Lymphs Abs: 1.3 10*3/uL (ref 0.7–4.0)
MCH: 23.3 pg — ABNORMAL LOW (ref 26.0–34.0)
MCHC: 30 g/dL (ref 30.0–36.0)
MCV: 77.5 fL — ABNORMAL LOW (ref 80.0–100.0)
Monocytes Absolute: 0.1 10*3/uL (ref 0.1–1.0)
Monocytes Relative: 3 %
Neutro Abs: 2.3 10*3/uL (ref 1.7–7.7)
Neutrophils Relative %: 58 %
Platelets: 369 10*3/uL (ref 150–400)
RBC: 4.94 MIL/uL (ref 3.87–5.11)
RDW: 16.4 % — ABNORMAL HIGH (ref 11.5–15.5)
WBC: 4 10*3/uL (ref 4.0–10.5)
nRBC: 0 % (ref 0.0–0.2)

## 2018-10-10 LAB — PROTIME-INR
INR: 1 (ref 0.8–1.2)
Prothrombin Time: 12.6 seconds (ref 11.4–15.2)

## 2018-10-10 LAB — GLUCOSE, CAPILLARY: Glucose-Capillary: 102 mg/dL — ABNORMAL HIGH (ref 70–99)

## 2018-10-10 MED ORDER — CEFAZOLIN SODIUM-DEXTROSE 2-4 GM/100ML-% IV SOLN
INTRAVENOUS | Status: AC
  Administered 2018-10-10: 2 g via INTRAVENOUS
  Filled 2018-10-10: qty 100

## 2018-10-10 MED ORDER — LIDOCAINE-EPINEPHRINE (PF) 2 %-1:200000 IJ SOLN
INTRAMUSCULAR | Status: AC
  Filled 2018-10-10: qty 20

## 2018-10-10 MED ORDER — HEPARIN SOD (PORK) LOCK FLUSH 100 UNIT/ML IV SOLN
INTRAVENOUS | Status: AC | PRN
  Administered 2018-10-10: 500 [IU] via INTRAVENOUS

## 2018-10-10 MED ORDER — MIDAZOLAM HCL 2 MG/2ML IJ SOLN
INTRAMUSCULAR | Status: AC | PRN
  Administered 2018-10-10 (×2): 1 mg via INTRAVENOUS

## 2018-10-10 MED ORDER — LIDOCAINE-EPINEPHRINE (PF) 1 %-1:200000 IJ SOLN
INTRAMUSCULAR | Status: AC | PRN
  Administered 2018-10-10 (×2): 10 mL

## 2018-10-10 MED ORDER — SODIUM CHLORIDE 0.9 % IV SOLN
INTRAVENOUS | Status: DC
  Administered 2018-10-10: 14:00:00 via INTRAVENOUS

## 2018-10-10 MED ORDER — FENTANYL CITRATE (PF) 100 MCG/2ML IJ SOLN
INTRAMUSCULAR | Status: AC
  Filled 2018-10-10: qty 2

## 2018-10-10 MED ORDER — CEFAZOLIN SODIUM-DEXTROSE 2-4 GM/100ML-% IV SOLN
2.0000 g | INTRAVENOUS | Status: AC
  Administered 2018-10-10: 16:00:00 2 g via INTRAVENOUS

## 2018-10-10 MED ORDER — MIDAZOLAM HCL 2 MG/2ML IJ SOLN
INTRAMUSCULAR | Status: AC
  Filled 2018-10-10: qty 4

## 2018-10-10 MED ORDER — FENTANYL CITRATE (PF) 100 MCG/2ML IJ SOLN
INTRAMUSCULAR | Status: AC | PRN
  Administered 2018-10-10 (×2): 50 ug via INTRAVENOUS

## 2018-10-10 MED ORDER — HEPARIN SOD (PORK) LOCK FLUSH 100 UNIT/ML IV SOLN
INTRAVENOUS | Status: AC
  Filled 2018-10-10: qty 5

## 2018-10-10 NOTE — Consult Note (Signed)
Chief Complaint: Patient was seen in consultation today for Port-A-Cath placement  Referring Physician(s): Mohamed,Mohamed  Supervising Physician: Jacqulynn Cadet  Patient Status: [facility] - Out-pt  History of Present Illness: Carolyn [name] is a 63 y.o. 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  She presents today for Port-A-Cath placement for palliative chemotherapy.  Additional medical history as below.  Past Medical History:  Diagnosis Date  . Anginal pain ([place])    " with exertion "  . Arthritis    " 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 "  . Asthma    h/o  . Coronary artery disease   . Diabetes mellitus without complication ([place])    Type II  . GERD (gastroesophageal reflux disease)   . H/O hiatal hernia   . Heart murmur   . 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 Hypertension   . Persistent headaches    h/o  . Pneumonia    hx of PNA  . Shortness of breath   . Vaginal dryness    h/o    Past Surgical History:  Procedure Laterality Date  . ABDOMINAL HYSTERECTOMY    . bone spur    . CARDIAC CATHETERIZATION  06/13/2012  . carpel tunnel surgery Left   . COLONOSCOPY     9 years ago  . CORONARY ANGIOPLASTY  06/13/2012  . EYE SURGERY     cyst left eye   . LEFT HEART CATHETERIZATION WITH CORONARY ANGIOGRAM N/A 06/13/2012   Procedure: LEFT HEART CATHETERIZATION WITH CORONARY ANGIOGRAM;  Surgeon: Laverda Page, MD;  Location: [facility] CATH LAB;  Service: Cardiovascular;  Laterality: N/A;  . NECK SURGERY    . rotator cuff surgery Right 2015  . VIDEO BRONCHOSCOPY WITH ENDOBRONCHIAL NAVIGATION N/A 09/06/2018   Procedure: VIDEO BRONCHOSCOPY WITH ENDOBRONCHIAL NAVIGATION, left upper lung;  Surgeon: Lajuana Matte, MD;  Location: [facility];  Service: Thoracic;  Laterality: N/A;  . VIDEO BRONCHOSCOPY WITH  ENDOBRONCHIAL ULTRASOUND Left 09/06/2018   Procedure: VIDEO BRONCHOSCOPY WITH ENDOBRONCHIAL ULTRASOUND, left lung;  Surgeon: Lajuana Matte, MD;  Location: [facility];  Service: Thoracic;  Laterality: Left;  . WISDOM TOOTH EXTRACTION      Allergies: Lisinopril  Medications: Prior to Admission medications   Medication Sig Start Date End Date Taking? Authorizing Provider  acetaminophen (TYLENOL) 500 MG tablet Take 1,000 mg by mouth every 8 (eight) hours as needed for mild pain, fever or headache.    Yes [provider]  amLODipine (NORVASC) 5 MG tablet Take 5 mg by mouth at bedtime.    Yes [provider]  aspirin EC 81 MG tablet Take 81 mg by mouth daily.   Yes [provider]  carvedilol (COREG) 25 MG tablet Take 12.5 mg by mouth 2 (two) times daily with a meal.    Yes [provider]  cetirizine (ZYRTEC) 10 MG tablet Take 10 mg by mouth daily.   Yes [provider]  Cholecalciferol (VITAMIN D-3) 125 MCG (5000 UT) TABS Take 5,000 Units by mouth daily.   Yes [provider]  Chromium Picolinate 1000 MCG TABS Take 1,000 mg by mouth daily.   Yes [provider]  co-enzyme Q-10 30 MG capsule Take 30 mg by mouth daily.    Yes [provider]  esomeprazole (NEXIUM) 20 MG capsule  Take 20 mg by mouth daily at 12 noon.   Yes [provider]  folic acid (FOLVITE) 1 MG tablet Take 1 tablet (1 mg total) by mouth daily. 09/28/18  Yes Curt Bears, MD  hydrochlorothiazide (MICROZIDE) 12.5 MG capsule Take 12.5 mg by mouth daily.   Yes [provider]  metFORMIN (GLUCOPHAGE) 500 MG tablet Take 500 mg by mouth 2 (two) times daily with a meal.    Yes [provider]  Pitavastatin Calcium (LIVALO) 4 MG TABS Take 4 mg by mouth at bedtime.    Yes [provider]  prochlorperazine (COMPAZINE) 10 MG tablet Take 1 tablet (10 mg total) by mouth every 6 (six) hours as needed for nausea or vomiting. 09/28/18   Yes Curt Bears, MD  telmisartan (MICARDIS) 40 MG tablet Take 40 mg by mouth daily.   Yes [provider]  zolpidem (AMBIEN) 10 MG tablet Take 2.5 mg by mouth at bedtime.  06/12/18  Yes [provider]  ferrous sulfate 325 (65 FE) MG tablet Take 325 mg by mouth daily with breakfast.    [provider]  Ginger, Zingiber officinalis, (GINGER ROOT) 500 MG CAPS Take 500 mg by mouth daily.    [provider]  Javier Docker Oil 1000 MG CAPS Take 1,000 mg by mouth daily.    [provider]  lidocaine-prilocaine (EMLA) cream Apply to the Port-A-Cath site 30 to 60 minutes before chemotherapy. 09/28/18   Curt Bears, MD  methocarbamol (ROBAXIN) 500 MG tablet Take 1 tablet (500 mg total) by mouth every 6 (six) hours as needed for muscle spasms. 03/25/17   Lily Kocher, PA-C  nitroGLYCERIN (NITROSTAT) 0.4 MG SL tablet Place 1 tablet (0.4 mg total) under the tongue every 5 (five) minutes as needed for chest pain. Patient not taking: Reported on 09/12/2018 06/14/12   Adrian Prows, MD  [facility] ULTRA test strip USE 1 STRIP TO [place] DAILY 08/10/18   [provider]  Polyethyl Glycol-Propyl Glycol (SYSTANE) 0.4-0.3 % SOLN Place 1 drop into both eyes every 6 (six) hours as needed (dry eyes).     [provider]  psyllium (METAMUCIL) 58.6 % packet Take 1 packet by mouth at bedtime as needed (constipation).     [provider]  Turmeric 500 MG CAPS Take 500 mg by mouth daily.    [provider]     Family History  Problem Relation Age of Onset  . Breast cancer Other   . Diabetes Mother   . Glaucoma Mother   . Hypertension Mother   . Hypertension Father   . Asthma Father   . Diabetes Sister   . Diabetes Brother     Social History   Socioeconomic History  . Marital status: Married    Spouse name: Not on file  . Number of children: Not on file  . Years of education: Not on file  . Highest education level: Not on  file  Occupational History  . Not on file  Social Needs  . Financial resource strain: Not on file  . Food insecurity    Worry: Not on file    Inability: Not on file  . Transportation needs    Medical: No    Non-medical: No  Tobacco Use  . Smoking status: Former Smoker    Packs/day: 0.50    Years: 30.00    Pack years: 15.00    Quit date: 02/09/1999    Years since quitting: 19.6  . Smokeless tobacco:  Never Used  Substance and Sexual Activity  . Alcohol use: No  . Drug use: No  . Sexual activity: Yes    Birth control/protection: Post-menopausal  Lifestyle  . Physical activity    Days per week: Not on file    Minutes per session: Not on file  . Stress: Not on file  Relationships  . Social Herbalist on phone: Not on file    Gets together: Not on file    Attends religious service: Not on file    Active member of club or organization: Not on file    Attends meetings of clubs or organizations: Not on file    Relationship status: Not on file  Other Topics Concern  . Not on file  Social History Narrative  . Not on file      Review of Systems currently denies fever, headache, chest pain, dyspnea, cough, abdominal/back pain, nausea, vomiting or bleeding  Vital Signs: Blood pressure 153/68, heart rate 61, temp 98.1, respirations 16, O2 sat 98% room air   Physical Exam awake, alert.  Chest clear to auscultation bilaterally.  Heart with regular rate and rhythm.  Abdomen soft, positive bowel sounds, nontender.  No lower extremity edema.  Imaging: Mr Jeri Cos BW Contrast  Addendum Date: 09/18/2018   ADDENDUM REPORT: 09/18/2018 13:24 ADDENDUM: The study was reviewed via telephone with Dr. Isidore Moos on 09/18/2018 at 1:15 p.m. An additional punctate, subtly enhancing lesion posteriorly in the left superior temporal gyrus measures 1-2 mm (series 9, image 74 and series 10, image 21). Electronically Signed   By: Logan Bores M.D.   On: 09/18/2018 13:24   Result Date:  09/18/2018 CLINICAL DATA:  Non-small cell lung cancer with brain metastases. EXAM: MRI HEAD WITHOUT AND WITH CONTRAST TECHNIQUE: Multiplanar, multiecho pulse sequences of the brain and surrounding structures were obtained without and with intravenous contrast. CONTRAST:  8 mL Gadavist COMPARISON:  08/28/2018 FINDINGS: Brain: Multiple small enhancing brain lesions as follows (with references to series 9): 1. 3 mm left temporal lobe (image 66) 2. 2 mm left periatrial white matter (image 83) 3. 3 mm left parietal lobe (image 83) 4. 3 mm right parietal lobe (image 109) 5. 6 mm right frontal lobe (image 114) 6. 2 mm posterior left frontal lobe (image 121) 7. 2 mm posterior right frontal lobe (image 125) 8. 5 mm high anterior right frontal lobe (image 130) Some of these lesions have mildly enlarged while others were not well demonstrated on the prior 1.5 T study which was mildly motion degraded. There is mild edema associated with the 6 mm right frontal lesion which has increased without mass effect. At most minimal edema is associated with other lesions. A band of T2 FLAIR hyperintensity in the posterior left cerebellum is unchanged without an underlying enhancing lesion. No acute infarct, intracranial hemorrhage, midline shift, or extra-axial fluid collection is identified. The ventricles and sulci are normal. Vascular: Major intracranial vascular flow voids are preserved. Skull and upper cervical spine: No suspicious marrow lesion. Anterior cervical spine fusion. Sinuses/Orbits: Unremarkable orbits. Paranasal sinuses and mastoid air cells are clear. Other: None. IMPRESSION: 8 brain metastases with at most mild edema. Electronically Signed: By: Logan Bores M.D. On: 09/15/2018 17:17    Labs:  CBC: Recent Labs    09/12/18 1049 09/28/18 0931 10/05/18 1249 10/10/18 1310  WBC 7.9 5.5 6.0 4.0  HGB 11.2* 11.1* 10.9* 11.5*  HCT 37.8 37.3 35.4* 38.3  PLT 373 315 319 369  COAGS: Recent Labs     09/04/18 1548 10/10/18 1310  INR 1.0 1.0  APTT 38*  --     BMP: Recent Labs    09/04/18 1548 09/12/18 1049 09/20/18 1205 09/28/18 0931 10/05/18 1249  NA 140 141  --  141 142  K 4.0 3.8  --  4.1 3.7  CL 102 103  --  102 106  CO2 26 27  --  26 26  GLUCOSE 101* 153*  --  108* 142*  BUN 10 11 11 13 9   CALCIUM 9.4 9.5  --  9.3 9.3  CREATININE 0.92 1.09* 1.13* 0.92 0.96  GFRNONAA >60 53* 51* >60 >60  GFRAA >60 >60 59* >60 >60    LIVER FUNCTION TESTS: Recent Labs    09/04/18 1548 09/12/18 1049 09/28/18 0931 10/05/18 1249  BILITOT 0.4 0.3 0.4 0.3  AST 21 16 16 15   ALT 14 14 10 9   ALKPHOS 77 85 83 85  PROT 7.9 7.6 7.5 7.2  ALBUMIN 3.8 3.5 3.5 3.4*    TUMOR MARKERS: No results for input(s): AFPTM, CEA, CA199, CHROMGRNA in the last 8760 hours.  Assessment and Plan: 63 y.o. 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  She presents today for Port-A-Cath placement for palliative chemotherapy.Risks and benefits of image guided port-a-catheter placement was discussed with the patient including, but not limited to bleeding, infection, pneumothorax, or fibrin sheath development and need for additional procedures.  All of the patient's questions were answered, patient is agreeable to proceed. Consent signed and in chart.     Thank you for this interesting consult.  I greatly enjoyed meeting Carolyn [name] and look forward to participating in their care.  A copy of this report was sent to the requesting provider on this date.  Electronically Signed: D. Rowe Robert, PA-C 10/10/2018, 2:12 PM   I spent a total of  25 minutes   in face to face in clinical consultation, greater than 50% of which was counseling/coordinating care for Port-A-Cath placement

## 2018-10-10 NOTE — Procedures (Signed)
Interventional Radiology Procedure Note  Procedure: Placement of a right IJ approach single lumen PowerPort.  Tip is positioned at the superior cavoatrial junction and catheter is ready for immediate use.  Complications: No immediate Recommendations:  - Ok to shower tomorrow - Do not submerge for 7 days - Routine line care   Signed,  Heath K. McCullough, MD   

## 2018-10-10 NOTE — Discharge Instructions (Signed)
Implanted Port Insertion, Care After °This sheet gives you information about how to care for yourself after your procedure. Your health care provider may also give you more specific instructions. If you have problems or questions, contact your health care provider. °What can I expect after the procedure? °After the procedure, it is common to have: °· Discomfort at the port insertion site. °· Bruising on the skin over the port. This should improve over 3-4 days. °Follow these instructions at home: °Port care °· After your port is placed, you will get a manufacturer's information card. The card has information about your port. Keep this card with you at all times. °· Take care of the port as told by your health care provider. Ask your health care provider if you or a family member can get training for taking care of the port at home. A home health care nurse may also take care of the port. °· Make sure to remember what type of port you have. °Incision care ° °  ° °· Follow instructions from your health care provider about how to take care of your port insertion site. Make sure you: °? Wash your hands with soap and water before and after you change your bandage (dressing). If soap and water are not available, use hand sanitizer. °? Change your dressing as told by your health care provider. °? Leave stitches (sutures), skin glue, or adhesive strips in place. These skin closures may need to stay in place for 2 weeks or longer. If adhesive strip edges start to loosen and curl up, you may trim the loose edges. Do not remove adhesive strips completely unless your health care provider tells you to do that. °· Check your port insertion site every day for signs of infection. Check for: °? Redness, swelling, or pain. °? Fluid or blood. °? Warmth. °? Pus or a bad smell. °Activity °· Return to your normal activities as told by your health care provider. Ask your health care provider what activities are safe for you. °· Do not  lift anything that is heavier than 10 lb (4.5 kg), or the limit that you are told, until your health care provider says that it is safe. °General instructions °· Take over-the-counter and prescription medicines only as told by your health care provider. °· Do not take baths, swim, or use a hot tub until your health care provider approves. Ask your health care provider if you may take showers. You may only be allowed to take sponge baths. °· Do not drive for 24 hours if you were given a sedative during your procedure. °· Wear a medical alert bracelet in case of an emergency. This will tell any health care providers that you have a port. °· Keep all follow-up visits as told by your health care provider. This is important. °Contact a health care provider if: °· You cannot flush your port with saline as directed, or you cannot draw blood from the port. °· You have a fever or chills. °· You have redness, swelling, or pain around your port insertion site. °· You have fluid or blood coming from your port insertion site. °· Your port insertion site feels warm to the touch. °· You have pus or a bad smell coming from the port insertion site. °Get help right away if: °· You have chest pain or shortness of breath. °· You have bleeding from your port that you cannot control. °Summary °· Take care of the port as told by your health   care provider. Keep the manufacturer's information card with you at all times. °· Change your dressing as told by your health care provider. °· Contact a health care provider if you have a fever or chills or if you have redness, swelling, or pain around your port insertion site. °· Keep all follow-up visits as told by your health care provider. °This information is not intended to replace advice given to you by your health care provider. Make sure you discuss any questions you have with your health care provider. °Document Released: 11/15/2012 Document Revised: 08/23/2017 Document Reviewed:  08/23/2017 °Elsevier Patient Education © 2020 [employer] ° °Moderate Conscious Sedation, Adult, Care After °These instructions provide you with information about caring for yourself after your procedure. Your health care provider may also give you more specific instructions. Your treatment has been planned according to current medical practices, but problems sometimes occur. Call your health care provider if you have any problems or questions after your procedure. °What can I expect after the procedure? °After your procedure, it is common: °· To feel sleepy for several hours. °· To feel clumsy and have poor balance for several hours. °· To have poor judgment for several hours. °· To vomit if you eat too soon. °Follow these instructions at home: °For at least 24 hours after the procedure: ° °· Do not: °? Participate in activities where you could fall or become injured. °? Drive. °? Use heavy machinery. °? Drink alcohol. °? Take sleeping pills or medicines that cause drowsiness. °? Make important decisions or sign legal documents. °? Take care of children on your own. °· Rest. °Eating and drinking °· Follow the diet recommended by your health care provider. °· If you vomit: °? Drink water, juice, or soup when you can drink without vomiting. °? Make sure you have little or no nausea before eating solid foods. °General instructions °· Have a responsible adult stay with you until you are awake and alert. °· Take over-the-counter and prescription medicines only as told by your health care provider. °· If you smoke, do not smoke without supervision. °· Keep all follow-up visits as told by your health care provider. This is important. °Contact a health care provider if: °· You keep feeling nauseous or you keep vomiting. °· You feel light-headed. °· You develop a rash. °· You have a fever. °Get help right away if: °· You have trouble breathing. °This information is not intended to replace advice given to you by your  health care provider. Make sure you discuss any questions you have with your health care provider. °Document Released: 11/15/2012 Document Revised: 01/07/2017 Document Reviewed: 05/17/2015 °Elsevier Patient Education © 2020 [employer] ° °

## 2018-10-12 ENCOUNTER — Inpatient Hospital Stay: Payer: Medicare Other | Attending: Physician Assistant | Admitting: Physician Assistant

## 2018-10-12 ENCOUNTER — Inpatient Hospital Stay: Payer: Medicare Other

## 2018-10-12 ENCOUNTER — Encounter: Payer: Self-pay | Admitting: Physician Assistant

## 2018-10-12 ENCOUNTER — Other Ambulatory Visit: Payer: Self-pay

## 2018-10-12 VITALS — BP 143/70 | HR 58 | Temp 98.7°F | Resp 20 | Ht 60.5 in | Wt 177.9 lb

## 2018-10-12 DIAGNOSIS — Z79899 Other long term (current) drug therapy: Secondary | ICD-10-CM | POA: Insufficient documentation

## 2018-10-12 DIAGNOSIS — Z23 Encounter for immunization: Secondary | ICD-10-CM | POA: Insufficient documentation

## 2018-10-12 DIAGNOSIS — C3412 Malignant neoplasm of upper lobe, left bronchus or lung: Secondary | ICD-10-CM | POA: Insufficient documentation

## 2018-10-12 DIAGNOSIS — C3490 Malignant neoplasm of unspecified part of unspecified bronchus or lung: Secondary | ICD-10-CM

## 2018-10-12 DIAGNOSIS — D509 Iron deficiency anemia, unspecified: Secondary | ICD-10-CM | POA: Insufficient documentation

## 2018-10-12 DIAGNOSIS — Z5111 Encounter for antineoplastic chemotherapy: Secondary | ICD-10-CM | POA: Diagnosis not present

## 2018-10-12 DIAGNOSIS — C3492 Malignant neoplasm of unspecified part of left bronchus or lung: Secondary | ICD-10-CM

## 2018-10-12 DIAGNOSIS — C7931 Secondary malignant neoplasm of brain: Secondary | ICD-10-CM

## 2018-10-12 DIAGNOSIS — Z5112 Encounter for antineoplastic immunotherapy: Secondary | ICD-10-CM | POA: Diagnosis not present

## 2018-10-12 LAB — CMP (CANCER CENTER ONLY)
ALT: 14 U/L (ref 0–44)
AST: 20 U/L (ref 15–41)
Albumin: 3.3 g/dL — ABNORMAL LOW (ref 3.5–5.0)
Alkaline Phosphatase: 76 U/L (ref 38–126)
Anion gap: 9 (ref 5–15)
BUN: 14 mg/dL (ref 8–23)
CO2: 28 mmol/L (ref 22–32)
Calcium: 8.7 mg/dL — ABNORMAL LOW (ref 8.9–10.3)
Chloride: 102 mmol/L (ref 98–111)
Creatinine: 0.83 mg/dL (ref 0.44–1.00)
GFR, Est AFR Am: >60 mL/min (ref >60)
GFR, Estimated: >60 mL/min (ref >60)
Glucose, Bld: 93 mg/dL (ref 70–99)
Potassium: 4.2 mmol/L (ref 3.5–5.1)
Sodium: 139 mmol/L (ref 135–145)
Total Bilirubin: 0.5 mg/dL (ref 0.3–1.2)
Total Protein: 7 g/dL (ref 6.5–8.1)

## 2018-10-12 LAB — CBC WITH DIFFERENTIAL (CANCER CENTER ONLY)
Abs Immature Granulocytes: 0.01 10*3/uL (ref 0.00–0.07)
Basophils Absolute: 0 10*3/uL (ref 0.0–0.1)
Basophils Relative: 0 %
Eosinophils Absolute: 0.1 10*3/uL (ref 0.0–0.5)
Eosinophils Relative: 4 %
HCT: 32.6 % — ABNORMAL LOW (ref 36.0–46.0)
Hemoglobin: 9.9 g/dL — ABNORMAL LOW (ref 12.0–15.0)
Immature Granulocytes: 0 %
Lymphocytes Relative: 31 %
Lymphs Abs: 1.1 10*3/uL (ref 0.7–4.0)
MCH: 22.9 pg — ABNORMAL LOW (ref 26.0–34.0)
MCHC: 30.4 g/dL (ref 30.0–36.0)
MCV: 75.3 fL — ABNORMAL LOW (ref 80.0–100.0)
Monocytes Absolute: 0.2 10*3/uL (ref 0.1–1.0)
Monocytes Relative: 5 %
Neutro Abs: 2 10*3/uL (ref 1.7–7.7)
Neutrophils Relative %: 60 %
Platelet Count: 287 10*3/uL (ref 150–400)
RBC: 4.33 MIL/uL (ref 3.87–5.11)
RDW: 15.9 % — ABNORMAL HIGH (ref 11.5–15.5)
WBC Count: 3.4 10*3/uL — ABNORMAL LOW (ref 4.0–10.5)
nRBC: 0 % (ref 0.0–0.2)

## 2018-10-12 MED ORDER — SODIUM CHLORIDE 0.9% FLUSH
10.0000 mL | Freq: Once | INTRAVENOUS | Status: AC
  Administered 2018-10-12: 10 mL via INTRAVENOUS
  Filled 2018-10-12: qty 10

## 2018-10-12 NOTE — Progress Notes (Signed)
[facility] OFFICE PROGRESS NOTE  [name], MD [address] [place] [address]  DIAGNOSIS: stage IV(T1c, N1, M1 C) non-small cell lung cancer, adenocarcinoma. She presented with left upper lobe lung nodule in addition to right upper lobe lung nodule with left hilar lymphadenopathy as well as multiple brain metastasis. She was diagnosed in July of 2020  PRIOR THERAPY:  1) Stereotactic radiosurgery to the metastatic brain lesions under the care of Dr. Isidore Moos in August 2020  CURRENT THERAPY: Systemic chemotherapy with Carboplatin for an AUC of 5, Alimta 500 mg/m2, and Keytruda 200 mg IV every 3 weeks. Status post 1 cycle of treatment. First dose on 10/05/2018.  INTERVAL HISTORY: Carolyn [name] 63 y.o. female comes to the clinic for a follow-up visit. The patient is feeling well today without any concerning complaints. She recently completed her first cycle of systemic chemotherapy last week and she tolerated it well without any adverse side effects. She denies any fever, chills, night sweats, or weight loss.  She denies any chest pain, shortness of breath, cough, or hemoptysis.  She denies any vomiting, diarrhea, or constipation. She denies any significant nausea but did take her anti-emetic when she felt like she may beginning to feel nauseous. She denies any rashes or skin changes. She recently had a port-a-cath placed earlier this week. She tolerated it well except developed a small skin blister at the site of her bandage. She denies erythema, swelling, pain, drainage, or flatulence. She states that she had a mild headache last week following treatment which resolved with the use of tylenol. She recently completed SBRT to the metastatic brain lesions under the care of Dr. Isidore Moos. She has a one month follow up appointment scheduled on 9/15 with Dr. Isidore Moos. She is here today for evaluation and a one week follow up after completing her first cycle of chemotherapy.    MEDICAL HISTORY: Past Medical History:  Diagnosis Date  . Anginal pain ([place])    " with exertion "  . Arthritis    " 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 "  . Asthma    h/o  . Coronary artery disease   . Diabetes mellitus without complication ([place])    Type II  . GERD (gastroesophageal reflux disease)   . H/O hiatal hernia   . Heart murmur   . 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 Hypertension   . Persistent headaches    h/o  . Pneumonia    hx of PNA  . Shortness of breath   . Vaginal dryness    h/o    ALLERGIES:  is allergic to lisinopril.  MEDICATIONS:  Current Outpatient Medications  Medication Sig Dispense Refill  . acetaminophen (TYLENOL) 500 MG tablet Take 1,000 mg by mouth every 8 (eight) hours as needed for mild pain, fever or headache.     Marland Kitchen amLODipine (NORVASC) 5 MG tablet Take 5 mg by mouth at bedtime.     Marland Kitchen aspirin EC 81 MG tablet Take 81 mg by mouth daily.    . carvedilol (COREG) 25 MG tablet Take 12.5 mg by mouth 2 (two) times daily with a meal.     . cetirizine (ZYRTEC) 10 MG tablet Take 10 mg by mouth daily.    . Cholecalciferol (VITAMIN D-3) 125 MCG (5000 UT) TABS Take 5,000 Units by mouth daily.    . Chromium Picolinate 1000 MCG TABS Take 1,000 mg by mouth daily.    Marland Kitchen co-enzyme Q-10 30 MG capsule  Take 30 mg by mouth daily.     . esomeprazole (NEXIUM) 20 MG capsule Take 20 mg by mouth daily at 12 noon.    . ferrous sulfate 325 (65 FE) MG tablet Take 325 mg by mouth daily with breakfast.    . folic acid (FOLVITE) 1 MG tablet Take 1 tablet (1 mg total) by mouth daily. 30 tablet 4  . Ginger, Zingiber officinalis, (GINGER ROOT) 500 MG CAPS Take 500 mg by mouth daily.    . hydrochlorothiazide (MICROZIDE) 12.5 MG capsule Take 12.5 mg by mouth daily.    . Krill Oil 1000 MG CAPS Take 1,000 mg by mouth daily.    . lidocaine-prilocaine (EMLA) cream Apply to the Port-A-Cath site 30 to 60 minutes before chemotherapy. 30 g 0  . metFORMIN (GLUCOPHAGE) 500 MG tablet Take 500 mg by mouth 2 (two) times  daily with a meal.     . methocarbamol (ROBAXIN) 500 MG tablet Take 1 tablet (500 mg total) by mouth every 6 (six) hours as needed for muscle spasms. 21 tablet 0  . nitroGLYCERIN (NITROSTAT) 0.4 MG SL tablet Place 1 tablet (0.4 mg total) under the tongue every 5 (five) minutes as needed for chest pain. (Patient not taking: Reported on 09/12/2018) 30 tablet 12  . ONETOUCH ULTRA test strip USE 1 STRIP TO CHECK GLUCOSE THREE TIMES DAILY    . Pitavastatin Calcium (LIVALO) 4 MG TABS Take 4 mg by mouth at bedtime.     . Polyethyl Glycol-Propyl Glycol (SYSTANE) 0.4-0.3 % SOLN Place 1 drop into both eyes every 6 (six) hours as needed (dry eyes).     . prochlorperazine (COMPAZINE) 10 MG tablet Take 1 tablet (10 mg total) by mouth every 6 (six) hours as needed for nausea or vomiting. 30 tablet 0  . psyllium (METAMUCIL) 58.6 % packet Take 1 packet by mouth at bedtime as needed (constipation).     . telmisartan (MICARDIS) 40 MG tablet Take 40 mg by mouth daily.    . Turmeric 500 MG CAPS Take 500 mg by mouth daily.    . zolpidem (AMBIEN) 10 MG tablet Take 2.5 mg by mouth at bedtime.      No current facility-administered medications for this visit.     SURGICAL HISTORY:  Past Surgical History:  Procedure Laterality Date  . ABDOMINAL HYSTERECTOMY    . bone spur    . CARDIAC CATHETERIZATION  06/13/2012  . carpel tunnel surgery Left   . COLONOSCOPY     9 years ago  . CORONARY ANGIOPLASTY  06/13/2012  . EYE SURGERY     cyst left eye   . IR IMAGING GUIDED PORT INSERTION  10/10/2018  . LEFT HEART CATHETERIZATION WITH CORONARY ANGIOGRAM N/A 06/13/2012   Procedure: LEFT HEART CATHETERIZATION WITH CORONARY ANGIOGRAM;  Surgeon: Jagadeesh R Ganji, MD;  Location: [facility] CATH [facility];  Service: Cardiovascular;  Laterality: N/A;  . NECK SURGERY    . rotator cuff surgery Right 2015  . VIDEO BRONCHOSCOPY WITH ENDOBRONCHIAL NAVIGATION N/A 09/06/2018   Procedure: VIDEO BRONCHOSCOPY WITH ENDOBRONCHIAL NAVIGATION, left upper lung;   Surgeon: Lightfoot, Harrell O, MD;  Location: [facility] OR;  Service: Thoracic;  Laterality: N/A;  . VIDEO BRONCHOSCOPY WITH ENDOBRONCHIAL ULTRASOUND Left 09/06/2018   Procedure: VIDEO BRONCHOSCOPY WITH ENDOBRONCHIAL ULTRASOUND, left lung;  Surgeon: Lightfoot, Harrell O, MD;  Location: [facility] OR;  Service: Thoracic;  Laterality: Left;  . WISDOM TOOTH EXTRACTION      REVIEW OF SYSTEMS:   Review of Systems  Constitutional: Positive   for fatigue. Negative for appetite change, chills, fever and unexpected weight change.  HENT: Negative for mouth sores, nosebleeds, sore throat and trouble swallowing.   Eyes: Negative for eye problems and icterus.  Respiratory: Negative for cough, hemoptysis, shortness of breath and wheezing.   Cardiovascular: Negative for chest pain and leg swelling.  Gastrointestinal: Negative for abdominal pain, constipation, diarrhea, nausea and vomiting.  Genitourinary: Negative for bladder incontinence, difficulty urinating, dysuria, frequency and hematuria.   Musculoskeletal: Negative for back pain, gait problem, neck pain and neck stiffness.  Skin: Negative for itching and rash.  Neurological: Positive for mild single headache (resolved). Negative for dizziness, extremity weakness, gait problem, light-headedness and seizures.  Hematological: Negative for adenopathy. Does not bruise/bleed easily.  Psychiatric/Behavioral: Negative for confusion, depression and sleep disturbance. The patient is not nervous/anxious.     PHYSICAL EXAMINATION:  Blood pressure (!) 143/70, pulse (!) 58, temperature 98.7 F (37.1 C), temperature source Oral, resp. rate 20, height 5' 0.5" (1.537 m), weight 177 lb 14.4 oz (80.7 kg).  ECOG PERFORMANCE STATUS: 1 - Symptomatic but completely ambulatory  Physical Exam  Constitutional: Oriented to person, place, and time and well-developed, well-nourished, and in no distress.  HENT:  Head: Normocephalic and atraumatic.  Mouth/Throat: Oropharynx is clear and  moist. No oropharyngeal exudate.  Eyes: Conjunctivae are normal. Right eye exhibits no discharge. Left eye exhibits no discharge. No scleral icterus.  Neck: Normal range of motion. Neck supple.  Cardiovascular: Normal rate, regular rhythm, normal heart sounds and intact distal pulses.   Pulmonary/Chest: Effort normal and breath sounds normal. No respiratory distress. No wheezes. No rales.  Abdominal: Soft. Bowel sounds are normal. Exhibits no distension and no mass. There is no tenderness.  Musculoskeletal: Normal range of motion. Exhibits no edema.  Lymphadenopathy:    No cervical adenopathy.  Neurological: Alert and oriented to person, place, and time. Exhibits normal muscle tone. Gait normal. Coordination normal.  Skin: Skin is warm and dry. No rash noted. Not diaphoretic. No erythema. No pallor.  Psychiatric: Mood, memory and judgment normal.  Vitals reviewed.  LABORATORY DATA: Lab Results  Component Value Date   WBC 3.4 (L) 10/12/2018   HGB 9.9 (L) 10/12/2018   HCT 32.6 (L) 10/12/2018   MCV 75.3 (L) 10/12/2018   PLT 287 10/12/2018      Chemistry      Component Value Date/Time   NA 139 10/12/2018 1034   K 4.2 10/12/2018 1034   CL 102 10/12/2018 1034   CO2 28 10/12/2018 1034   BUN 14 10/12/2018 1034   CREATININE 0.83 10/12/2018 1034      Component Value Date/Time   CALCIUM 8.7 (L) 10/12/2018 1034   ALKPHOS 76 10/12/2018 1034   AST 20 10/12/2018 1034   ALT 14 10/12/2018 1034   BILITOT 0.5 10/12/2018 1034       RADIOGRAPHIC STUDIES:  Mr Jeri Cos TJ Contrast  Addendum Date: 09/18/2018   ADDENDUM REPORT: 09/18/2018 13:24 ADDENDUM: The study was reviewed via telephone with Dr. Isidore Moos on 09/18/2018 at 1:15 p.m. An additional punctate, subtly enhancing lesion posteriorly in the left superior temporal gyrus measures 1-2 mm (series 9, image 74 and series 10, image 21). Electronically Signed   By: Logan Bores M.D.   On: 09/18/2018 13:24   Result Date: 09/18/2018 CLINICAL  DATA:  Non-small cell lung cancer with brain metastases. EXAM: MRI HEAD WITHOUT AND WITH CONTRAST TECHNIQUE: Multiplanar, multiecho pulse sequences of the brain and surrounding structures were obtained without and with intravenous  contrast. CONTRAST:  8 mL Gadavist COMPARISON:  08/28/2018 FINDINGS: Brain: Multiple small enhancing brain lesions as follows (with references to series 9): 1. 3 mm left temporal lobe (image 66) 2. 2 mm left periatrial white matter (image 83) 3. 3 mm left parietal lobe (image 83) 4. 3 mm right parietal lobe (image 109) 5. 6 mm right frontal lobe (image 114) 6. 2 mm posterior left frontal lobe (image 121) 7. 2 mm posterior right frontal lobe (image 125) 8. 5 mm high anterior right frontal lobe (image 130) Some of these lesions have mildly enlarged while others were not well demonstrated on the prior 1.5 T study which was mildly motion degraded. There is mild edema associated with the 6 mm right frontal lesion which has increased without mass effect. At most minimal edema is associated with other lesions. A band of T2 FLAIR hyperintensity in the posterior left cerebellum is unchanged without an underlying enhancing lesion. No acute infarct, intracranial hemorrhage, midline shift, or extra-axial fluid collection is identified. The ventricles and sulci are normal. Vascular: Major intracranial vascular flow voids are preserved. Skull and upper cervical spine: No suspicious marrow lesion. Anterior cervical spine fusion. Sinuses/Orbits: Unremarkable orbits. Paranasal sinuses and mastoid air cells are clear. Other: None. IMPRESSION: 8 brain metastases with at most mild edema. Electronically Signed: By: Allen  Grady M.D. On: 09/15/2018 17:17   Ir Imaging Guided Port Insertion  Result Date: 10/10/2018 INDICATION: Adenocarcinoma of the lung EXAM: IMPLANTED PORT A CATH PLACEMENT WITH ULTRASOUND AND FLUOROSCOPIC GUIDANCE MEDICATIONS: Ancef 2 gm IV; The antibiotic was administered within an  appropriate time interval prior to skin puncture. ANESTHESIA/SEDATION: Versed 2 mg IV; Fentanyl 100 mcg IV; Moderate Sedation Time: 21 minutes The patient was continuously monitored during the procedure by the interventional radiology nurse under my direct supervision. FLUOROSCOPY TIME:  0 minutes, 30 seconds (5 mGy) COMPLICATIONS: None immediate. PROCEDURE: The right neck and chest was prepped with chlorhexidine, and draped in the usual sterile fashion using maximum barrier technique (cap and mask, sterile gown, sterile gloves, large sterile sheet, hand hygiene and cutaneous antiseptic). Local anesthesia was attained by infiltration with 1% lidocaine with epinephrine. Ultrasound demonstrated patency of the right internal jugular vein, and this was documented with an image. Under real-time ultrasound guidance, this vein was accessed with a 21 gauge micropuncture needle and image documentation was performed. A small dermatotomy was made at the access site with an 11 scalpel. A 0.018" wire was advanced into the SVC and the access needle exchanged for a 4F micropuncture vascular sheath. The 0.018" wire was then removed and a 0.035" wire advanced into the IVC. An appropriate location for the subcutaneous reservoir was selected below the clavicle and an incision was made through the skin and underlying soft tissues. The subcutaneous tissues were then dissected using a combination of blunt and sharp surgical technique and a pocket was formed. A single lumen power injectable portacatheter was then tunneled through the subcutaneous tissues from the pocket to the dermatotomy and the port reservoir placed within the subcutaneous pocket. The venous access site was then serially dilated and a peel away vascular sheath placed over the wire. The wire was removed and the port catheter advanced into position under fluoroscopic guidance. The catheter tip is positioned in the superior cavoatrial junction. This was documented with a  spot image. The portacatheter was then tested and found to flush and aspirate well. The port was flushed with saline followed by 100 units/mL heparinized saline. The pocket was then   closed in two layers using first subdermal inverted interrupted absorbable sutures followed by a running subcuticular suture. The epidermis was then sealed with Dermabond. The dermatotomy at the venous access site was also closed with Dermabond. IMPRESSION: Successful placement of a right IJ approach Power Port with ultrasound and fluoroscopic guidance. The catheter is ready for use. Electronically Signed   By: Jacqulynn Cadet M.D.   On: 10/10/2018 17:24     ASSESSMENT/PLAN:  This is a very pleasant 63 year old African-American female with stage IV (T1c, N1, M1c) lung cancer. She presented with a dominant left upper lobe lung nodule in addition to a right upper lobe pulmonary nodule with left hilar lymphadenopathy. She also has metastatic disease to the brain. She has no actionable mutations and her PDL1 expression is 1%. She was diagnosed in July 2020.   The patient completed SBRT to the metastatic brain lesion under the care of Dr. Isidore Moos in August 2020.   The patient is currently undergoing systemic chemotherapy with carboplatin for an AUC of 5, Alimta 500 mg/m, and Keytruda 200 mg IV every 3 weeks.  She is status post her first cycle of treatment.  She tolerated her treatment well without any adverse side effects.  Labs were reviewed with the patient.  I recommend that she proceed with cycle #2 in 2 weeks as scheduled.  She will continue taking folic acid 1 mg p.o. daily as well as Compazine 10 mg every 6 hours as needed for nausea.  The patient will follow-up with Dr. Isidore Moos next week as scheduled.  The patient has a history of iron deficiency anemia. She had been taking iron supplements in the past, however, she had stopped taking them since her recent lung cancer diagnosis. She was encouraged to take her iron  supplements again as her labs reflect microcytic anemia.  The patient was advised to call immediately if she has any concerning symptoms in the interval. The patient voices understanding of current disease status and treatment options and is in agreement with the current care plan. All questions were answered. The patient knows to call the clinic with any problems, questions or concerns. We can certainly see the patient much sooner if necessary  No orders of the defined types were placed in this encounter.    Jesten Cappuccio L Vendela Troung, PA-C 10/12/18

## 2018-10-12 NOTE — Progress Notes (Signed)
Met with patient in lobby to introduce myself as Financial Resource Specialist and to offer available resources. ° °Discussed one-time $700 [facility] grant and qualifications to assist with personal expenses while going through treatment. ° °Gave her my card if interested in applying and for any additional financial questions or concerns. °

## 2018-10-13 NOTE — Procedures (Signed)
  Name: Carolyn [name]  MRN: [id number]  Date: 09/22/2018   DOB: 02-05-1956  Stereotactic Radiosurgery Operative Note  PRE-OPERATIVE DIAGNOSIS:  Multiple Brain Metastases  POST-OPERATIVE DIAGNOSIS:  Multiple Brain Metastases  PROCEDURE:  Stereotactic Radiosurgery  SURGEON:  Batya Citron P, MD  NARRATIVE: The patient underwent a radiation treatment planning session in the radiation oncology simulation suite under the care of the radiation oncology physician and physicist.  I participated closely in the radiation treatment planning afterwards. The patient underwent planning CT which was fused to 3T high resolution MRI with 1 mm axial slices.  These images were fused on the planning system.  We contoured the gross target volumes and subsequently expanded this to yield the Planning Target Volume. I actively participated in the planning process.  I helped to define and review the target contours and also the contours of the optic pathway, eyes, brainstem and selected nearby organs at risk.  All the dose constraints for critical structures were reviewed and compared to AAPM Task Group 101.  The prescription dose conformity was reviewed.  I approved the plan electronically.    Accordingly, Carolyn [name] was brought to the TrueBeam stereotactic radiation treatment linac and placed in the custom immobilization mask.  The patient was aligned according to the IR fiducial markers with Brain[facility] Exactrac, then orthogonal x-rays were used in ExacTrac with the 6DOF robotic table and the shifts were made to align the patient  Carolyn [name] received stereotactic radiosurgery uneventfully.    Lesions treated:  9   Complex lesions treated:  0(>3.5 cm, <58mm of optic path, or within the brainstem)   The detailed description of the procedure is recorded in the radiation oncology procedure note.  I was present for the duration of the procedure.  DISPOSITION:  Following delivery, the patient was transported to  nursing in stable condition and monitored for possible acute effects to be discharged to home in stable condition with follow-up in one month.  Carolyn [name] P, MD 10/13/2018 2:05 PM

## 2018-10-13 NOTE — Addendum Note (Signed)
Encounter addended by: Kary Kos, MD on: 10/13/2018 2:06 PM  Actions taken: Clinical Note Signed

## 2018-10-18 ENCOUNTER — Encounter: Payer: Self-pay | Admitting: Radiation Oncology

## 2018-10-19 ENCOUNTER — Inpatient Hospital Stay: Payer: Medicare Other

## 2018-10-19 ENCOUNTER — Other Ambulatory Visit: Payer: Self-pay

## 2018-10-19 DIAGNOSIS — D509 Iron deficiency anemia, unspecified: Secondary | ICD-10-CM | POA: Diagnosis not present

## 2018-10-19 DIAGNOSIS — C3412 Malignant neoplasm of upper lobe, left bronchus or lung: Secondary | ICD-10-CM | POA: Diagnosis not present

## 2018-10-19 DIAGNOSIS — C3490 Malignant neoplasm of unspecified part of unspecified bronchus or lung: Secondary | ICD-10-CM

## 2018-10-19 DIAGNOSIS — C7931 Secondary malignant neoplasm of brain: Secondary | ICD-10-CM | POA: Diagnosis not present

## 2018-10-19 DIAGNOSIS — Z5111 Encounter for antineoplastic chemotherapy: Secondary | ICD-10-CM | POA: Diagnosis not present

## 2018-10-19 DIAGNOSIS — Z5112 Encounter for antineoplastic immunotherapy: Secondary | ICD-10-CM | POA: Diagnosis not present

## 2018-10-19 DIAGNOSIS — Z23 Encounter for immunization: Secondary | ICD-10-CM | POA: Diagnosis not present

## 2018-10-19 DIAGNOSIS — Z95828 Presence of other vascular implants and grafts: Secondary | ICD-10-CM

## 2018-10-19 LAB — CBC WITH DIFFERENTIAL (CANCER CENTER ONLY)
Abs Immature Granulocytes: 0 10*3/uL (ref 0.00–0.07)
Basophils Absolute: 0 10*3/uL (ref 0.0–0.1)
Basophils Relative: 0 %
Eosinophils Absolute: 0.1 10*3/uL (ref 0.0–0.5)
Eosinophils Relative: 2 %
HCT: 32.4 % — ABNORMAL LOW (ref 36.0–46.0)
Hemoglobin: 10 g/dL — ABNORMAL LOW (ref 12.0–15.0)
Immature Granulocytes: 0 %
Lymphocytes Relative: 34 %
Lymphs Abs: 1 10*3/uL (ref 0.7–4.0)
MCH: 23.3 pg — ABNORMAL LOW (ref 26.0–34.0)
MCHC: 30.9 g/dL (ref 30.0–36.0)
MCV: 75.3 fL — ABNORMAL LOW (ref 80.0–100.0)
Monocytes Absolute: 0.5 10*3/uL (ref 0.1–1.0)
Monocytes Relative: 19 %
Neutro Abs: 1.3 10*3/uL — ABNORMAL LOW (ref 1.7–7.7)
Neutrophils Relative %: 45 %
Platelet Count: 229 10*3/uL (ref 150–400)
RBC: 4.3 MIL/uL (ref 3.87–5.11)
RDW: 16 % — ABNORMAL HIGH (ref 11.5–15.5)
WBC Count: 2.9 10*3/uL — ABNORMAL LOW (ref 4.0–10.5)
nRBC: 0 % (ref 0.0–0.2)

## 2018-10-19 LAB — CMP (CANCER CENTER ONLY)
ALT: 15 U/L (ref 0–44)
AST: 19 U/L (ref 15–41)
Albumin: 3.5 g/dL (ref 3.5–5.0)
Alkaline Phosphatase: 76 U/L (ref 38–126)
Anion gap: 8 (ref 5–15)
BUN: 10 mg/dL (ref 8–23)
CO2: 29 mmol/L (ref 22–32)
Calcium: 9.2 mg/dL (ref 8.9–10.3)
Chloride: 104 mmol/L (ref 98–111)
Creatinine: 0.89 mg/dL (ref 0.44–1.00)
GFR, Est AFR Am: >60 mL/min (ref >60)
GFR, Estimated: >60 mL/min (ref >60)
Glucose, Bld: 104 mg/dL — ABNORMAL HIGH (ref 70–99)
Potassium: 4 mmol/L (ref 3.5–5.1)
Sodium: 141 mmol/L (ref 135–145)
Total Bilirubin: 0.3 mg/dL (ref 0.3–1.2)
Total Protein: 7.1 g/dL (ref 6.5–8.1)

## 2018-10-19 MED ORDER — INFLUENZA VAC SPLIT QUAD 0.5 ML IM SUSY
0.5000 mL | PREFILLED_SYRINGE | Freq: Once | INTRAMUSCULAR | Status: AC
  Administered 2018-10-19: 11:00:00 0.5 mL via INTRAMUSCULAR
  Filled 2018-10-19: qty 0.5

## 2018-10-19 MED ORDER — SODIUM CHLORIDE 0.9% FLUSH
10.0000 mL | INTRAVENOUS | Status: DC | PRN
  Administered 2018-10-19: 10:00:00 10 mL
  Filled 2018-10-19: qty 10

## 2018-10-19 MED ORDER — HEPARIN SOD (PORK) LOCK FLUSH 100 UNIT/ML IV SOLN
500.0000 [IU] | Freq: Once | INTRAVENOUS | Status: AC | PRN
  Administered 2018-10-19: 10:00:00 500 [IU]
  Filled 2018-10-19: qty 5

## 2018-10-19 NOTE — Patient Instructions (Signed)

## 2018-10-24 ENCOUNTER — Ambulatory Visit
Admission: RE | Admit: 2018-10-24 | Discharge: 2018-10-24 | Disposition: A | Discharge status: Home / Self Care | Payer: Medicare Other | Source: Ambulatory Visit | Attending: Radiation Oncology | Admitting: Radiation Oncology

## 2018-10-24 DIAGNOSIS — C7931 Secondary malignant neoplasm of brain: Secondary | ICD-10-CM

## 2018-10-24 NOTE — Progress Notes (Signed)
Radiation Oncology         [phone] ________________________________  Name: Carolyn [name] MRN: [id number]  Date: 10/24/2018  DOB: [birth year]  Follow-Up Visit Note by phone as she cannot access WebEx   CC: [name], MD  Curt Bears, MD  Diagnosis and Prior Radiotherapy:    ICD-10-CM   1. Brain metastases ([place])  C79.31     CHIEF COMPLAINT: Here for follow-up and surveillance of brain metastases  Narrative:  She is doing well. Had one mild HA since SRS. No new complaints. Receiving systemic therapy - tolerating well.                    ALLERGIES:  is allergic to lisinopril.  Meds: Current Outpatient Medications  Medication Sig Dispense Refill  . acetaminophen (TYLENOL) 500 MG tablet Take 1,000 mg by mouth every 8 (eight) hours as needed for mild pain, fever or headache.     Marland Kitchen amLODipine (NORVASC) 5 MG tablet Take 5 mg by mouth at bedtime.     Marland Kitchen aspirin EC 81 MG tablet Take 81 mg by mouth daily.    . carvedilol (COREG) 25 MG tablet Take 12.5 mg by mouth 2 (two) times daily with a meal.     . cetirizine (ZYRTEC) 10 MG tablet Take 10 mg by mouth daily.    . Cholecalciferol (VITAMIN D-3) 125 MCG (5000 UT) TABS Take 5,000 Units by mouth daily.    . Chromium Picolinate 1000 MCG TABS Take 1,000 mg by mouth daily.    Marland Kitchen co-enzyme Q-10 30 MG capsule Take 30 mg by mouth daily.     Marland Kitchen esomeprazole (NEXIUM) 20 MG capsule Take 20 mg by mouth daily at 12 noon.    . ferrous sulfate 325 (65 FE) MG tablet Take 325 mg by mouth daily with breakfast.    . folic acid (FOLVITE) 1 MG tablet Take 1 tablet (1 mg total) by mouth daily. 30 tablet 4  . Ginger, Zingiber officinalis, (GINGER ROOT) 500 MG CAPS Take 500 mg by mouth daily.    . hydrochlorothiazide (MICROZIDE) 12.5 MG capsule Take 12.5 mg by mouth daily.    Javier Docker Oil 1000 MG CAPS Take 1,000 mg by mouth daily.    Marland Kitchen lidocaine-prilocaine (EMLA) cream Apply to the Port-A-Cath site 30 to 60 minutes before chemotherapy. 30 g 0  .  metFORMIN (GLUCOPHAGE) 500 MG tablet Take 500 mg by mouth 2 (two) times daily with a meal.     . methocarbamol (ROBAXIN) 500 MG tablet Take 1 tablet (500 mg total) by mouth every 6 (six) hours as needed for muscle spasms. 21 tablet 0  . nitroGLYCERIN (NITROSTAT) 0.4 MG SL tablet Place 1 tablet (0.4 mg total) under the tongue every 5 (five) minutes as needed for chest pain. (Patient not taking: Reported on 09/12/2018) 30 tablet 12  . ONETOUCH ULTRA test strip USE 1 STRIP TO CHECK GLUCOSE THREE TIMES DAILY    . Pitavastatin Calcium (LIVALO) 4 MG TABS Take 4 mg by mouth at bedtime.     Vladimir Faster Glycol-Propyl Glycol (SYSTANE) 0.4-0.3 % SOLN Place 1 drop into both eyes every 6 (six) hours as needed (dry eyes).     . prochlorperazine (COMPAZINE) 10 MG tablet Take 1 tablet (10 mg total) by mouth every 6 (six) hours as needed for nausea or vomiting. 30 tablet 0  . psyllium (METAMUCIL) 58.6 % packet Take 1 packet by mouth at bedtime as needed (constipation).     Marland Kitchen  telmisartan (MICARDIS) 40 MG tablet Take 40 mg by mouth daily.    . Turmeric 500 MG CAPS Take 500 mg by mouth daily.    Marland Kitchen zolpidem (AMBIEN) 10 MG tablet Take 2.5 mg by mouth at bedtime.      No current facility-administered medications for this encounter.     Physical Findings: The patient is in no acute distress. Patient is alert and oriented.  vitals were not taken for this visit. .      Lab Findings: Lab Results  Component Value Date   WBC 2.9 (L) 10/19/2018   HGB 10.0 (L) 10/19/2018   HCT 32.4 (L) 10/19/2018   MCV 75.3 (L) 10/19/2018   PLT 229 10/19/2018    Radiographic Findings: Ir Imaging Guided Port Insertion  Result Date: 10/10/2018 INDICATION: Adenocarcinoma of the lung EXAM: IMPLANTED PORT A CATH PLACEMENT WITH ULTRASOUND AND FLUOROSCOPIC GUIDANCE MEDICATIONS: Ancef 2 gm IV; The antibiotic was administered within an appropriate time interval prior to skin puncture. ANESTHESIA/SEDATION: Versed 2 mg IV; Fentanyl 100 mcg IV;  Moderate Sedation Time: 21 minutes The patient was continuously monitored during the procedure by the interventional radiology nurse under my direct supervision. FLUOROSCOPY TIME:  0 minutes, 30 seconds (5 mGy) COMPLICATIONS: None immediate. PROCEDURE: The right neck and chest was prepped with chlorhexidine, and draped in the usual sterile fashion using maximum barrier technique (cap and mask, sterile gown, sterile gloves, large sterile sheet, hand hygiene and cutaneous antiseptic). Local anesthesia was attained by infiltration with 1% lidocaine with epinephrine. Ultrasound demonstrated patency of the right internal jugular vein, and this was documented with an image. Under real-time ultrasound guidance, this vein was accessed with a 21 gauge micropuncture needle and image documentation was performed. A small dermatotomy was made at the access site with an 11 scalpel. A 0.018" wire was advanced into the SVC and the access needle exchanged for a 90F micropuncture vascular sheath. The 0.018" wire was then removed and a 0.035" wire advanced into the IVC. An appropriate location for the subcutaneous reservoir was selected below the clavicle and an incision was made through the skin and underlying soft tissues. The subcutaneous tissues were then dissected using a combination of blunt and sharp surgical technique and a pocket was formed. A single lumen power injectable portacatheter was then tunneled through the subcutaneous tissues from the pocket to the dermatotomy and the port reservoir placed within the subcutaneous pocket. The venous access site was then serially dilated and a peel away vascular sheath placed over the wire. The wire was removed and the port catheter advanced into position under fluoroscopic guidance. The catheter tip is positioned in the superior cavoatrial junction. This was documented with a spot image. The portacatheter was then tested and found to flush and aspirate well. The port was flushed with  saline followed by 100 units/mL heparinized saline. The pocket was then closed in two layers using first subdermal inverted interrupted absorbable sutures followed by a running subcuticular suture. The epidermis was then sealed with Dermabond. The dermatotomy at the venous access site was also closed with Dermabond. IMPRESSION: Successful placement of a right IJ approach Power Port with ultrasound and fluoroscopic guidance. The catheter is ready for use. Electronically Signed   By: Jacqulynn Cadet M.D.   On: 10/10/2018 17:24    Impression/Plan: [name] will arrange imaging in 2 months in the form of: brain MRI. With f/u to see Dr. Saintclair Halsted after.  I'll see her after her next MRI.  She is  pleased with this plan.  This encounter was provided by telemedicine platform phone as she could not access Webex.  The patient has given verbal consent for this type of encounter and has been advised to only accept a meeting of this type in a secure network environment. The time spent during this encounter was 5 minutes. The attendants for this meeting include Eppie Gibson  and [name].  During the encounter, Eppie Gibson was located at [facility] Radiation Oncology Department.  [name] was located at home.    _____________________________________   Eppie Gibson, MD

## 2018-10-25 ENCOUNTER — Encounter: Payer: Self-pay | Admitting: Radiation Oncology

## 2018-10-25 DIAGNOSIS — E119 Type 2 diabetes mellitus without complications: Secondary | ICD-10-CM | POA: Diagnosis not present

## 2018-10-26 ENCOUNTER — Other Ambulatory Visit: Payer: Medicare Other

## 2018-10-26 ENCOUNTER — Inpatient Hospital Stay: Payer: Medicare Other

## 2018-10-26 ENCOUNTER — Other Ambulatory Visit: Payer: Self-pay

## 2018-10-26 ENCOUNTER — Inpatient Hospital Stay (HOSPITAL_BASED_OUTPATIENT_CLINIC_OR_DEPARTMENT_OTHER): Payer: Medicare Other | Admitting: Physician Assistant

## 2018-10-26 ENCOUNTER — Ambulatory Visit: Payer: Medicare Other

## 2018-10-26 ENCOUNTER — Ambulatory Visit: Payer: Medicare Other | Admitting: Physician Assistant

## 2018-10-26 VITALS — BP 140/76 | HR 67 | Temp 97.7°F | Resp 17 | Ht 60.5 in | Wt 179.4 lb

## 2018-10-26 DIAGNOSIS — C3492 Malignant neoplasm of unspecified part of left bronchus or lung: Secondary | ICD-10-CM

## 2018-10-26 DIAGNOSIS — Z5112 Encounter for antineoplastic immunotherapy: Secondary | ICD-10-CM

## 2018-10-26 DIAGNOSIS — D509 Iron deficiency anemia, unspecified: Secondary | ICD-10-CM | POA: Diagnosis not present

## 2018-10-26 DIAGNOSIS — C7931 Secondary malignant neoplasm of brain: Secondary | ICD-10-CM

## 2018-10-26 DIAGNOSIS — Z5111 Encounter for antineoplastic chemotherapy: Secondary | ICD-10-CM | POA: Diagnosis not present

## 2018-10-26 DIAGNOSIS — C3490 Malignant neoplasm of unspecified part of unspecified bronchus or lung: Secondary | ICD-10-CM

## 2018-10-26 DIAGNOSIS — Z23 Encounter for immunization: Secondary | ICD-10-CM | POA: Diagnosis not present

## 2018-10-26 DIAGNOSIS — R5382 Chronic fatigue, unspecified: Secondary | ICD-10-CM

## 2018-10-26 DIAGNOSIS — Z95828 Presence of other vascular implants and grafts: Secondary | ICD-10-CM

## 2018-10-26 DIAGNOSIS — C3412 Malignant neoplasm of upper lobe, left bronchus or lung: Secondary | ICD-10-CM | POA: Diagnosis not present

## 2018-10-26 LAB — CMP (CANCER CENTER ONLY)
ALT: 11 U/L (ref 0–44)
AST: 18 U/L (ref 15–41)
Albumin: 3.5 g/dL (ref 3.5–5.0)
Alkaline Phosphatase: 85 U/L (ref 38–126)
Anion gap: 11 (ref 5–15)
BUN: 11 mg/dL (ref 8–23)
CO2: 27 mmol/L (ref 22–32)
Calcium: 9.4 mg/dL (ref 8.9–10.3)
Chloride: 103 mmol/L (ref 98–111)
Creatinine: 1.03 mg/dL — ABNORMAL HIGH (ref 0.44–1.00)
GFR, Est AFR Am: >60 mL/min (ref >60)
GFR, Estimated: 57 mL/min — ABNORMAL LOW (ref >60)
Glucose, Bld: 122 mg/dL — ABNORMAL HIGH (ref 70–99)
Potassium: 3.9 mmol/L (ref 3.5–5.1)
Sodium: 141 mmol/L (ref 135–145)
Total Bilirubin: <0.2 mg/dL — ABNORMAL LOW (ref 0.3–1.2)
Total Protein: 7.2 g/dL (ref 6.5–8.1)

## 2018-10-26 LAB — CBC WITH DIFFERENTIAL (CANCER CENTER ONLY)
Abs Immature Granulocytes: 0.02 10*3/uL (ref 0.00–0.07)
Basophils Absolute: 0 10*3/uL (ref 0.0–0.1)
Basophils Relative: 0 %
Eosinophils Absolute: 0.1 10*3/uL (ref 0.0–0.5)
Eosinophils Relative: 1 %
HCT: 34.3 % — ABNORMAL LOW (ref 36.0–46.0)
Hemoglobin: 10.3 g/dL — ABNORMAL LOW (ref 12.0–15.0)
Immature Granulocytes: 0 %
Lymphocytes Relative: 30 %
Lymphs Abs: 1.7 10*3/uL (ref 0.7–4.0)
MCH: 23.2 pg — ABNORMAL LOW (ref 26.0–34.0)
MCHC: 30 g/dL (ref 30.0–36.0)
MCV: 77.3 fL — ABNORMAL LOW (ref 80.0–100.0)
Monocytes Absolute: 0.8 10*3/uL (ref 0.1–1.0)
Monocytes Relative: 13 %
Neutro Abs: 3.1 10*3/uL (ref 1.7–7.7)
Neutrophils Relative %: 56 %
Platelet Count: 402 10*3/uL — ABNORMAL HIGH (ref 150–400)
RBC: 4.44 MIL/uL (ref 3.87–5.11)
RDW: 16.7 % — ABNORMAL HIGH (ref 11.5–15.5)
WBC Count: 5.6 10*3/uL (ref 4.0–10.5)
nRBC: 0 % (ref 0.0–0.2)

## 2018-10-26 LAB — TSH: TSH: 1.313 u[IU]/mL (ref 0.308–3.960)

## 2018-10-26 MED ORDER — SODIUM CHLORIDE 0.9 % IV SOLN
Freq: Once | INTRAVENOUS | Status: AC
  Administered 2018-10-26: 10:00:00 via INTRAVENOUS
  Filled 2018-10-26: qty 5

## 2018-10-26 MED ORDER — HEPARIN SOD (PORK) LOCK FLUSH 100 UNIT/ML IV SOLN
500.0000 [IU] | Freq: Once | INTRAVENOUS | Status: AC | PRN
  Administered 2018-10-26: 500 [IU]
  Filled 2018-10-26: qty 5

## 2018-10-26 MED ORDER — SODIUM CHLORIDE 0.9 % IV SOLN
200.0000 mg | Freq: Once | INTRAVENOUS | Status: AC
  Administered 2018-10-26: 200 mg via INTRAVENOUS
  Filled 2018-10-26: qty 8

## 2018-10-26 MED ORDER — PALONOSETRON HCL INJECTION 0.25 MG/5ML
0.2500 mg | Freq: Once | INTRAVENOUS | Status: AC
  Administered 2018-10-26: 0.25 mg via INTRAVENOUS

## 2018-10-26 MED ORDER — SODIUM CHLORIDE 0.9 % IV SOLN
484.0000 mg | Freq: Once | INTRAVENOUS | Status: AC
  Administered 2018-10-26: 480 mg via INTRAVENOUS
  Filled 2018-10-26: qty 48

## 2018-10-26 MED ORDER — SODIUM CHLORIDE 0.9% FLUSH
10.0000 mL | INTRAVENOUS | Status: DC | PRN
  Administered 2018-10-26: 10 mL
  Filled 2018-10-26: qty 10

## 2018-10-26 MED ORDER — PALONOSETRON HCL INJECTION 0.25 MG/5ML
INTRAVENOUS | Status: AC
  Filled 2018-10-26: qty 5

## 2018-10-26 MED ORDER — SODIUM CHLORIDE 0.9 % IV SOLN
485.0000 mg/m2 | Freq: Once | INTRAVENOUS | Status: AC
  Administered 2018-10-26: 900 mg via INTRAVENOUS
  Filled 2018-10-26: qty 20

## 2018-10-26 MED ORDER — SODIUM CHLORIDE 0.9 % IV SOLN
Freq: Once | INTRAVENOUS | Status: AC
  Administered 2018-10-26: 09:00:00 via INTRAVENOUS
  Filled 2018-10-26: qty 250

## 2018-10-26 NOTE — Patient Instructions (Signed)
[facility] Discharge Instructions for Patients Receiving Chemotherapy  Today you received the following chemotherapy agents Keytruda, Alimta, and Carboplatin  To help prevent nausea and vomiting after your treatment, we encourage you to take your nausea medication as directed.   If you develop nausea and vomiting that is not controlled by your nausea medication, call the clinic.   BELOW ARE SYMPTOMS THAT SHOULD BE REPORTED IMMEDIATELY:  *FEVER GREATER THAN 100.5 F  *CHILLS WITH OR WITHOUT FEVER  NAUSEA AND VOMITING THAT IS NOT CONTROLLED WITH YOUR NAUSEA MEDICATION  *UNUSUAL SHORTNESS OF BREATH  *UNUSUAL BRUISING OR BLEEDING  TENDERNESS IN MOUTH AND THROAT WITH OR WITHOUT PRESENCE OF ULCERS  *URINARY PROBLEMS  *BOWEL PROBLEMS  UNUSUAL RASH Items with * indicate a potential emergency and should be followed up as soon as possible.  Feel free to call the clinic should you have any questions or concerns. The clinic phone number is [phone].  Please show the [facility] at check-in to the Emergency Department and triage nurse.  Pembrolizumab injection(Keytruda) What is this medicine? PEMBROLIZUMAB (pem broe liz ue mab) is a monoclonal antibody. It is used to treat bladder cancer, cervical cancer, endometrial cancer, esophageal cancer, head and neck cancer, hepatocellular cancer, Hodgkin lymphoma, kidney cancer, lymphoma, melanoma, Merkel cell carcinoma, lung cancer, stomach cancer, urothelial cancer, and cancers that have a certain genetic condition. This medicine may be used for other purposes; ask your health care provider or pharmacist if you have questions. COMMON BRAND NAME(S): Keytruda What should I tell my health care provider before I take this medicine? They need to know if you have any of these conditions:  diabetes  immune system problems  inflammatory bowel disease  liver disease  lung or breathing disease  lupus  received or  scheduled to receive an organ transplant or a stem-cell transplant that uses donor stem cells  an unusual or allergic reaction to pembrolizumab, other medicines, foods, dyes, or preservatives  pregnant or trying to get pregnant  breast-feeding How should I use this medicine? This medicine is for infusion into a vein. It is given by a health care professional in a hospital or clinic setting. A special MedGuide will be given to you before each treatment. Be sure to read this information carefully each time. Talk to your pediatrician regarding the use of this medicine in children. While this drug may be prescribed for selected conditions, precautions do apply. Overdosage: If you think you have taken too much of this medicine contact a poison control center or emergency room at once. NOTE: This medicine is only for you. Do not share this medicine with others. What if I miss a dose? It is important not to miss your dose. Call your doctor or health care professional if you are unable to keep an appointment. What may interact with this medicine? Interactions have not been studied. Give your health care provider a list of all the medicines, herbs, non-prescription drugs, or dietary supplements you use. Also tell them if you smoke, drink alcohol, or use illegal drugs. Some items may interact with your medicine. This list may not describe all possible interactions. Give your health care provider a list of all the medicines, herbs, non-prescription drugs, or dietary supplements you use. Also tell them if you smoke, drink alcohol, or use illegal drugs. Some items may interact with your medicine. What should I watch for while using this medicine? Your condition will be monitored carefully while you are receiving this medicine.  You may need blood work done while you are taking this medicine. Do not become pregnant while taking this medicine or for 4 months after stopping it. Women should inform their doctor  if they wish to become pregnant or think they might be pregnant. There is a potential for serious side effects to an unborn child. Talk to your health care professional or pharmacist for more information. Do not breast-feed an infant while taking this medicine or for 4 months after the last dose. What side effects may I notice from receiving this medicine? Side effects that you should report to your doctor or health care professional as soon as possible:  allergic reactions like skin rash, itching or hives, swelling of the face, lips, or tongue  bloody or black, tarry  breathing problems  changes in vision  chest pain  chills  confusion  constipation  cough  diarrhea  dizziness or feeling faint or lightheaded  fast or irregular heartbeat  fever  flushing  hair loss  joint pain  low blood counts - this medicine may decrease the number of white blood cells, red blood cells and platelets. You may be at increased risk for infections and bleeding.  muscle pain  muscle weakness  persistent headache  redness, blistering, peeling or loosening of the skin, including inside the mouth  signs and symptoms of high blood sugar such as dizziness; dry mouth; dry skin; fruity breath; nausea; stomach pain; increased hunger or thirst; increased urination  signs and symptoms of kidney injury like trouble passing urine or change in the amount of urine  signs and symptoms of liver injury like dark urine, light-colored stools, loss of appetite, nausea, right upper belly pain, yellowing of the eyes or skin  sweating  swollen lymph nodes  weight loss Side effects that usually do not require medical attention (report to your doctor or health care professional if they continue or are bothersome):  decreased appetite  muscle pain  tiredness This list may not describe all possible side effects. Call your doctor for medical advice about side effects. You may report side effects to  FDA at 1-800-FDA-1088. Where should I keep my medicine? This drug is given in a hospital or clinic and will not be stored at home. NOTE: This sheet is a summary. It may not cover all possible information. If you have questions about this medicine, talk to your doctor, pharmacist, or health care provider.  2020 Elsevier/Gold Standard (2018-02-21 13:46:58)   Pemetrexed injection(Alimta) What is this medicine? PEMETREXED (PEM e TREX ed) is a chemotherapy drug used to treat lung cancers like non-small cell lung cancer and mesothelioma. It may also be used to treat other cancers. This medicine may be used for other purposes; ask your health care provider or pharmacist if you have questions. COMMON BRAND NAME(S): Alimta What should I tell my health care provider before I take this medicine? They need to know if you have any of these conditions:  infection (especially a virus infection such as chickenpox, cold sores, or herpes)  kidney disease  low blood counts, like low white cell, platelet, or red cell counts  lung or breathing disease, like asthma  radiation therapy  an unusual or allergic reaction to pemetrexed, other medicines, foods, dyes, or preservative  pregnant or trying to get pregnant  breast-feeding How should I use this medicine? This drug is given as an infusion into a vein. It is administered in a hospital or clinic by a specially trained health care professional.  Talk to your pediatrician regarding the use of this medicine in children. Special care may be needed. Overdosage: If you think you have taken too much of this medicine contact a poison control center or emergency room at once. NOTE: This medicine is only for you. Do not share this medicine with others. What if I miss a dose? It is important not to miss your dose. Call your doctor or health care professional if you are unable to keep an appointment. What may interact with this medicine? This medicine may  interact with the following medications:  Ibuprofen This list may not describe all possible interactions. Give your health care provider a list of all the medicines, herbs, non-prescription drugs, or dietary supplements you use. Also tell them if you smoke, drink alcohol, or use illegal drugs. Some items may interact with your medicine. What should I watch for while using this medicine? Visit your doctor for checks on your progress. This drug may make you feel generally unwell. This is not uncommon, as chemotherapy can affect healthy cells as well as cancer cells. Report any side effects. Continue your course of treatment even though you feel ill unless your doctor tells you to stop. In some cases, you may be given additional medicines to help with side effects. Follow all directions for their use. Call your doctor or health care professional for advice if you get a fever, chills or sore throat, or other symptoms of a cold or flu. Do not treat yourself. This drug decreases your body's ability to fight infections. Try to avoid being around people who are sick. This medicine may increase your risk to bruise or bleed. Call your doctor or health care professional if you notice any unusual bleeding. Be careful brushing and flossing your teeth or using a toothpick because you may get an infection or bleed more easily. If you have any dental work done, tell your dentist you are receiving this medicine. Avoid taking products that contain aspirin, acetaminophen, ibuprofen, naproxen, or ketoprofen unless instructed by your doctor. These medicines may hide a fever. Call your doctor or health care professional if you get diarrhea or mouth sores. Do not treat yourself. To protect your kidneys, drink water or other fluids as directed while you are taking this medicine. Do not become pregnant while taking this medicine or for 6 months after stopping it. Women should inform their doctor if they wish to become pregnant  or think they might be pregnant. Men should not father a child while taking this medicine and for 3 months after stopping it. This may interfere with the ability to father a child. You should talk to your doctor or health care professional if you are concerned about your fertility. There is a potential for serious side effects to an unborn child. Talk to your health care professional or pharmacist for more information. Do not breast-feed an infant while taking this medicine or for 1 week after stopping it. What side effects may I notice from receiving this medicine? Side effects that you should report to your doctor or health care professional as soon as possible:  allergic reactions like skin rash, itching or hives, swelling of the face, lips, or tongue  breathing problems  redness, blistering, peeling or loosening of the skin, including inside the mouth  signs and symptoms of bleeding such as bloody or black, tarry stools; red or dark-brown urine; spitting up blood or brown material that looks like coffee grounds; red spots on the skin; unusual bruising  or bleeding from the eye, gums, or nose  signs and symptoms of infection like fever or chills; cough; sore throat; pain or trouble passing urine  signs and symptoms of kidney injury like trouble passing urine or change in the amount of urine  signs and symptoms of liver injury like dark yellow or brown urine; general ill feeling or flu-like symptoms; light-colored stools; loss of appetite; nausea; right upper belly pain; unusually weak or tired; yellowing of the eyes or skin Side effects that usually do not require medical attention (report to your doctor or health care professional if they continue or are bothersome):  constipation  mouth sores  nausea, vomiting  unusually weak or tired This list may not describe all possible side effects. Call your doctor for medical advice about side effects. You may report side effects to FDA at  1-800-FDA-1088. Where should I keep my medicine? This drug is given in a hospital or clinic and will not be stored at home. NOTE: This sheet is a summary. It may not cover all possible information. If you have questions about this medicine, talk to your doctor, pharmacist, or health care provider.  2020 Elsevier/Gold Standard (2017-03-16 16:11:33)   Carboplatin injection What is this medicine? CARBOPLATIN (KAR boe pla tin) is a chemotherapy drug. It targets fast dividing cells, like cancer cells, and causes these cells to die. This medicine is used to treat ovarian cancer and many other cancers. This medicine may be used for other purposes; ask your health care provider or pharmacist if you have questions. COMMON BRAND NAME(S): Paraplatin What should I tell my health care provider before I take this medicine? They need to know if you have any of these conditions:  blood disorders  hearing problems  kidney disease  recent or ongoing radiation therapy  an unusual or allergic reaction to carboplatin, cisplatin, other chemotherapy, other medicines, foods, dyes, or preservatives  pregnant or trying to get pregnant  breast-feeding How should I use this medicine? This drug is usually given as an infusion into a vein. It is administered in a hospital or clinic by a specially trained health care professional. Talk to your pediatrician regarding the use of this medicine in children. Special care may be needed. Overdosage: If you think you have taken too much of this medicine contact a poison control center or emergency room at once. NOTE: This medicine is only for you. Do not share this medicine with others. What if I miss a dose? It is important not to miss a dose. Call your doctor or health care professional if you are unable to keep an appointment. What may interact with this medicine?  medicines for seizures  medicines to increase blood counts like filgrastim, pegfilgrastim,  sargramostim  some antibiotics like amikacin, gentamicin, neomycin, streptomycin, tobramycin  vaccines Talk to your doctor or health care professional before taking any of these medicines:  acetaminophen  aspirin  ibuprofen  ketoprofen  naproxen This list may not describe all possible interactions. Give your health care provider a list of all the medicines, herbs, non-prescription drugs, or dietary supplements you use. Also tell them if you smoke, drink alcohol, or use illegal drugs. Some items may interact with your medicine. What should I watch for while using this medicine? Your condition will be monitored carefully while you are receiving this medicine. You will need important blood work done while you are taking this medicine. This drug may make you feel generally unwell. This is not uncommon, as chemotherapy can  affect healthy cells as well as cancer cells. Report any side effects. Continue your course of treatment even though you feel ill unless your doctor tells you to stop. In some cases, you may be given additional medicines to help with side effects. Follow all directions for their use. Call your doctor or health care professional for advice if you get a fever, chills or sore throat, or other symptoms of a cold or flu. Do not treat yourself. This drug decreases your body's ability to fight infections. Try to avoid being around people who are sick. This medicine may increase your risk to bruise or bleed. Call your doctor or health care professional if you notice any unusual bleeding. Be careful brushing and flossing your teeth or using a toothpick because you may get an infection or bleed more easily. If you have any dental work done, tell your dentist you are receiving this medicine. Avoid taking products that contain aspirin, acetaminophen, ibuprofen, naproxen, or ketoprofen unless instructed by your doctor. These medicines may hide a fever. Do not become pregnant while taking  this medicine. Women should inform their doctor if they wish to become pregnant or think they might be pregnant. There is a potential for serious side effects to an unborn child. Talk to your health care professional or pharmacist for more information. Do not breast-feed an infant while taking this medicine. What side effects may I notice from receiving this medicine? Side effects that you should report to your doctor or health care professional as soon as possible:  allergic reactions like skin rash, itching or hives, swelling of the face, lips, or tongue  signs of infection - fever or chills, cough, sore throat, pain or difficulty passing urine  signs of decreased platelets or bleeding - bruising, pinpoint red spots on the skin, black, tarry stools, nosebleeds  signs of decreased red blood cells - unusually weak or tired, fainting spells, lightheadedness  breathing problems  changes in hearing  changes in vision  chest pain  high blood pressure  low blood counts - This drug may decrease the number of white blood cells, red blood cells and platelets. You may be at increased risk for infections and bleeding.  nausea and vomiting  pain, swelling, redness or irritation at the injection site  pain, tingling, numbness in the hands or feet  problems with balance, talking, walking  trouble passing urine or change in the amount of urine Side effects that usually do not require medical attention (report to your doctor or health care professional if they continue or are bothersome):  hair loss  loss of appetite  metallic taste in the mouth or changes in taste This list may not describe all possible side effects. Call your doctor for medical advice about side effects. You may report side effects to FDA at 1-800-FDA-1088. Where should I keep my medicine? This drug is given in a hospital or clinic and will not be stored at home. NOTE: This sheet is a summary. It may not cover all  possible information. If you have questions about this medicine, talk to your doctor, pharmacist, or health care provider.  2020 Elsevier/Gold Standard (2007-05-02 14:38:05)

## 2018-10-26 NOTE — Progress Notes (Signed)
[facility] OFFICE PROGRESS NOTE  [name], MD [address] [place] [address]  DIAGNOSIS: stage IV(T1c, N1, M1 C)non-small cell lung cancer, adenocarcinoma. Shepresented with left upper lobe lung nodule in addition to right upper lobe lung nodule with left hilar lymphadenopathy as well as multiple brain metastasis. She was diagnosed in July of 2020  PRIOR THERAPY: 1) Stereotactic radiosurgery to the metastatic brain lesions under the care of Dr. Isidore Moos in August 2020  CURRENT THERAPY: Systemic chemotherapy with Carboplatin for an AUC of 5, Alimta 500 mg/m2, and Keytruda 200 mg IV every 3 weeks. Status post 1 cycle of treatment. First dose on 10/05/2018.  INTERVAL HISTORY: Carolyn [name] 63 y.o. female returns to the clinic for a follow up visit. The patient is feeling well today without any concerning complaints. The patient continues to tolerate treatment with carboplatin, alimta, and keytruda well without any adverse effects. Denies any fever, chills, night sweats, or weight loss. Denies any chest pain, shortness of breath, cough, or hemoptysis. Denies any nausea, vomiting, diarrhea, or constipation. Denies any headache or visual changes. She recently had a follow up visit with Dr. Isidore Moos for her history of brain metastases. The patient states that she is scheduled for a repeat brain MRI in November and has a follow up with her neurosurgeon Dr. Saintclair Halsted around that time. Denies any rashes or skin changes. The patient is here today for evaluation prior to starting cycle # 2   MEDICAL HISTORY: Past Medical History:  Diagnosis Date  . Anginal pain ([place])    " with exertion "  . Arthritis    " 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 "  . Asthma    h/o  . Coronary artery disease   . Diabetes mellitus without complication ([place])    Type II  . GERD (gastroesophageal reflux disease)   . H/O hiatal hernia   . Heart murmur   . History of radiation therapy 09/22/2018   stereotactic  radiosurgery   . 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 Hypertension   . Persistent headaches    h/o  . Pneumonia    hx of PNA  . Shortness of breath   . Vaginal dryness    h/o    ALLERGIES:  is allergic to lisinopril.  MEDICATIONS:  Current Outpatient Medications  Medication Sig Dispense Refill  . acetaminophen (TYLENOL) 500 MG tablet Take 1,000 mg by mouth every 8 (eight) hours as needed for mild pain, fever or headache.     Marland Kitchen amLODipine (NORVASC) 5 MG tablet Take 5 mg by mouth at bedtime.     Marland Kitchen aspirin EC 81 MG tablet Take 81 mg by mouth daily.    . carvedilol (COREG) 25 MG tablet Take 12.5 mg by mouth 2 (two) times daily with a meal.     . cetirizine (ZYRTEC) 10 MG tablet Take 10 mg by mouth daily.    . Cholecalciferol (VITAMIN D-3) 125 MCG (5000 UT) TABS Take 5,000 Units by mouth daily.    . Chromium Picolinate 1000 MCG TABS Take 1,000 mg by mouth daily.    Marland Kitchen co-enzyme Q-10 30 MG capsule Take 30 mg by mouth daily.     Marland Kitchen esomeprazole (NEXIUM) 20 MG capsule Take 20 mg by mouth daily at 12 noon.    . ferrous sulfate 325 (65 FE) MG tablet Take 325 mg by mouth daily with breakfast.    . folic acid (FOLVITE) 1 MG tablet Take 1 tablet (1  mg total) by mouth daily. 30 tablet 4  . Ginger, Zingiber officinalis, (GINGER ROOT) 500 MG CAPS Take 500 mg by mouth daily.    . hydrochlorothiazide (MICROZIDE) 12.5 MG capsule Take 12.5 mg by mouth daily.    Javier Docker Oil 1000 MG CAPS Take 1,000 mg by mouth daily.    Marland Kitchen lidocaine-prilocaine (EMLA) cream Apply to the Port-A-Cath site 30 to 60 minutes before chemotherapy. 30 g 0  . metFORMIN (GLUCOPHAGE) 500 MG tablet Take 500 mg by mouth 2 (two) times daily with a meal.     . methocarbamol (ROBAXIN) 500 MG tablet Take 1 tablet (500 mg total) by mouth every 6 (six) hours as needed for muscle spasms. 21 tablet 0  . ONETOUCH ULTRA test strip USE 1 STRIP TO CHECK GLUCOSE THREE TIMES DAILY    . Pitavastatin Calcium (LIVALO) 4 MG TABS Take 4 mg by mouth at bedtime.     Vladimir Faster Glycol-Propyl Glycol (SYSTANE) 0.4-0.3 % SOLN Place 1 drop into both eyes every 6 (six) hours as needed (dry eyes).     . prochlorperazine (COMPAZINE) 10 MG tablet Take 1 tablet (10 mg total) by mouth every 6 (six) hours as needed for nausea or vomiting. 30 tablet 0  . psyllium (METAMUCIL) 58.6 % packet Take 1 packet by mouth at bedtime as needed (constipation).     Marland Kitchen telmisartan (MICARDIS) 40 MG tablet Take 40 mg by mouth daily.    . Turmeric 500 MG CAPS Take 500 mg by mouth daily.    Marland Kitchen zolpidem (AMBIEN) 10 MG tablet Take 2.5 mg by mouth at bedtime.     . nitroGLYCERIN (NITROSTAT) 0.4 MG SL tablet Place 1 tablet (0.4 mg total) under the tongue every 5 (five) minutes as needed for chest pain. (Patient not taking: Reported on 09/12/2018) 30 tablet 12   No current facility-administered medications for this visit.     SURGICAL HISTORY:  Past Surgical History:  Procedure Laterality Date  . ABDOMINAL HYSTERECTOMY    . bone spur    . CARDIAC CATHETERIZATION  06/13/2012  . carpel tunnel surgery Left   . COLONOSCOPY     9 years ago  . CORONARY ANGIOPLASTY  06/13/2012  . EYE SURGERY     cyst left eye   . IR IMAGING GUIDED PORT INSERTION  10/10/2018  . LEFT HEART CATHETERIZATION WITH CORONARY ANGIOGRAM N/A 06/13/2012   Procedure: LEFT HEART CATHETERIZATION WITH CORONARY ANGIOGRAM;  Surgeon: Laverda Page, MD;  Location: [facility] CATH LAB;  Service: Cardiovascular;  Laterality: N/A;  . NECK SURGERY    . rotator cuff surgery Right 2015  . VIDEO BRONCHOSCOPY WITH ENDOBRONCHIAL NAVIGATION N/A 09/06/2018   Procedure: VIDEO BRONCHOSCOPY WITH ENDOBRONCHIAL NAVIGATION, left upper lung;  Surgeon: Lajuana Matte, MD;  Location: [facility];  Service: Thoracic;  Laterality: N/A;  . VIDEO BRONCHOSCOPY WITH ENDOBRONCHIAL ULTRASOUND Left 09/06/2018   Procedure: VIDEO BRONCHOSCOPY WITH ENDOBRONCHIAL ULTRASOUND, left lung;  Surgeon: Lajuana Matte, MD;  Location: [facility];  Service: Thoracic;  Laterality:  Left;  . WISDOM TOOTH EXTRACTION      REVIEW OF SYSTEMS:   Review of Systems  Constitutional: Negative for appetite change, chills, fatigue, fever and unexpected weight change.  HENT:   Negative for mouth sores, nosebleeds, sore throat and trouble swallowing.   Eyes: Negative for eye problems and icterus.  Respiratory: Negative for cough, hemoptysis, shortness of breath and wheezing.   Cardiovascular: Negative for chest pain and leg swelling.  Gastrointestinal: Negative for  abdominal pain, constipation, diarrhea, nausea and vomiting.  Genitourinary: Negative for bladder incontinence, difficulty urinating, dysuria, frequency and hematuria.   Musculoskeletal: Negative for back pain, gait problem, neck pain and neck stiffness.  Skin: Negative for itching and rash.  Neurological: Negative for dizziness, extremity weakness, gait problem, headaches, light-headedness and seizures.  Hematological: Negative for adenopathy. Does not bruise/bleed easily.  Psychiatric/Behavioral: Negative for confusion, depression and sleep disturbance. The patient is not nervous/anxious.     PHYSICAL EXAMINATION:  Blood pressure 140/76, pulse 67, temperature 97.7 F (36.5 C), temperature source Temporal, resp. rate 17, height 5' 0.5" (1.537 m), weight 179 lb 6.4 oz (81.4 kg), SpO2 100 %.  ECOG PERFORMANCE STATUS: 1 - Symptomatic but completely ambulatory  Physical Exam  Constitutional: Oriented to person, place, and time and well-developed, well-nourished, and in no distress.  HENT:  Head: Normocephalic and atraumatic.  Mouth/Throat: Oropharynx is clear and moist. No oropharyngeal exudate.  Eyes: Conjunctivae are normal. Right eye exhibits no discharge. Left eye exhibits no discharge. No scleral icterus.  Neck: Normal range of motion. Neck supple.  Cardiovascular: Normal rate, regular rhythm, normal heart sounds and intact distal pulses.   Pulmonary/Chest: Effort normal and breath sounds normal. No respiratory  distress. No wheezes. No rales.  Abdominal: Soft. Bowel sounds are normal. Exhibits no distension and no mass. There is no tenderness.  Musculoskeletal: Normal range of motion. Exhibits no edema.  Lymphadenopathy:    No cervical adenopathy.  Neurological: Alert and oriented to person, place, and time. Exhibits normal muscle tone. Gait normal. Coordination normal.  Skin: Skin is warm and dry. No rash noted. Not diaphoretic. No erythema. No pallor.  Psychiatric: Mood, memory and judgment normal.  Vitals reviewed.  LABORATORY DATA: Lab Results  Component Value Date   WBC 5.6 10/26/2018   HGB 10.3 (L) 10/26/2018   HCT 34.3 (L) 10/26/2018   MCV 77.3 (L) 10/26/2018   PLT 402 (H) 10/26/2018      Chemistry      Component Value Date/Time   NA 141 10/26/2018 0809   K 3.9 10/26/2018 0809   CL 103 10/26/2018 0809   CO2 27 10/26/2018 0809   BUN 11 10/26/2018 0809   CREATININE 1.03 (H) 10/26/2018 0809      Component Value Date/Time   CALCIUM 9.4 10/26/2018 0809   ALKPHOS 85 10/26/2018 0809   AST 18 10/26/2018 0809   ALT 11 10/26/2018 0809   BILITOT <0.2 (L) 10/26/2018 0809       RADIOGRAPHIC STUDIES:  Ir Imaging Guided Port Insertion  Result Date: 10/10/2018 INDICATION: Adenocarcinoma of the lung EXAM: IMPLANTED PORT A CATH PLACEMENT WITH ULTRASOUND AND FLUOROSCOPIC GUIDANCE MEDICATIONS: Ancef 2 gm IV; The antibiotic was administered within an appropriate time interval prior to skin puncture. ANESTHESIA/SEDATION: Versed 2 mg IV; Fentanyl 100 mcg IV; Moderate Sedation Time: 21 minutes The patient was continuously monitored during the procedure by the interventional radiology nurse under my direct supervision. FLUOROSCOPY TIME:  0 minutes, 30 seconds (5 mGy) COMPLICATIONS: None immediate. PROCEDURE: The right neck and chest was prepped with chlorhexidine, and draped in the usual sterile fashion using maximum barrier technique (cap and mask, sterile gown, sterile gloves, large sterile  sheet, hand hygiene and cutaneous antiseptic). Local anesthesia was attained by infiltration with 1% lidocaine with epinephrine. Ultrasound demonstrated patency of the right internal jugular vein, and this was documented with an image. Under real-time ultrasound guidance, this vein was accessed with a 21 gauge micropuncture needle and image documentation was performed. A  small dermatotomy was made at the access site with an 11 scalpel. A 0.018" wire was advanced into the SVC and the access needle exchanged for a 53F micropuncture vascular sheath. The 0.018" wire was then removed and a 0.035" wire advanced into the IVC. An appropriate location for the subcutaneous reservoir was selected below the clavicle and an incision was made through the skin and underlying soft tissues. The subcutaneous tissues were then dissected using a combination of blunt and sharp surgical technique and a pocket was formed. A single lumen power injectable portacatheter was then tunneled through the subcutaneous tissues from the pocket to the dermatotomy and the port reservoir placed within the subcutaneous pocket. The venous access site was then serially dilated and a peel away vascular sheath placed over the wire. The wire was removed and the port catheter advanced into position under fluoroscopic guidance. The catheter tip is positioned in the superior cavoatrial junction. This was documented with a spot image. The portacatheter was then tested and found to flush and aspirate well. The port was flushed with saline followed by 100 units/mL heparinized saline. The pocket was then closed in two layers using first subdermal inverted interrupted absorbable sutures followed by a running subcuticular suture. The epidermis was then sealed with Dermabond. The dermatotomy at the venous access site was also closed with Dermabond. IMPRESSION: Successful placement of a right IJ approach Power Port with ultrasound and fluoroscopic guidance. The catheter  is ready for use. Electronically Signed   By: Jacqulynn Cadet M.D.   On: 10/10/2018 17:24     ASSESSMENT/PLAN:  This is a very pleasant 63 year old African-American female with stage IV (T1c, N1,M1c)lung cancer. She presented with a dominant left upper lobe lung nodule in addition to a right upper lobe pulmonary nodule with left hilar lymphadenopathy. She also has metastatic disease to the brain. She has no actionable mutations and her PDL1 expression is 1%. She was diagnosed in July 2020.   The patient completed SBRT to the metastatic brain lesion under the care of Dr. Isidore Moos in August 2020.   The patient is currently undergoing systemic chemotherapy with carboplatin for an AUC of 5, Alimta 500 mg/m, and Keytruda 200 mg IV every 3 weeks.  She is status post her first cycle of treatment.  She tolerated her treatment well without any adverse side effects.  Labs were reviewed with the patient.  I recommend that she proceed with cycle #2 today as scheduled.  She will continue taking folic acid 1 mg p.o. daily as well as Compazine 10 mg every 6 hours as needed for nausea.  The patient will continue taking her iron for her iron deficiency anemia.   The patient was advised to call immediately if she has any concerning symptoms in the interval. The patient voices understanding of current disease status and treatment options and is in agreement with the current care plan. All questions were answered. The patient knows to call the clinic with any problems, questions or concerns. We can certainly see the patient much sooner if necessaryKeep taking iron. Sees Dr. Isidore Moos and Dr. Saintclair Halsted in 3 months with MRI. No symptoms No orders of the defined types were placed in this encounter.    Cassandra L Heilingoetter, PA-C 10/26/18

## 2018-11-02 ENCOUNTER — Inpatient Hospital Stay: Payer: Medicare Other

## 2018-11-02 ENCOUNTER — Other Ambulatory Visit: Payer: Self-pay

## 2018-11-02 DIAGNOSIS — Z5112 Encounter for antineoplastic immunotherapy: Secondary | ICD-10-CM | POA: Diagnosis not present

## 2018-11-02 DIAGNOSIS — C7931 Secondary malignant neoplasm of brain: Secondary | ICD-10-CM | POA: Diagnosis not present

## 2018-11-02 DIAGNOSIS — Z5111 Encounter for antineoplastic chemotherapy: Secondary | ICD-10-CM | POA: Diagnosis not present

## 2018-11-02 DIAGNOSIS — C3490 Malignant neoplasm of unspecified part of unspecified bronchus or lung: Secondary | ICD-10-CM

## 2018-11-02 DIAGNOSIS — D509 Iron deficiency anemia, unspecified: Secondary | ICD-10-CM | POA: Diagnosis not present

## 2018-11-02 DIAGNOSIS — Z23 Encounter for immunization: Secondary | ICD-10-CM | POA: Diagnosis not present

## 2018-11-02 DIAGNOSIS — Z95828 Presence of other vascular implants and grafts: Secondary | ICD-10-CM

## 2018-11-02 DIAGNOSIS — C3412 Malignant neoplasm of upper lobe, left bronchus or lung: Secondary | ICD-10-CM | POA: Diagnosis not present

## 2018-11-02 LAB — CMP (CANCER CENTER ONLY)
ALT: 19 U/L (ref 0–44)
AST: 22 U/L (ref 15–41)
Albumin: 3.6 g/dL (ref 3.5–5.0)
Alkaline Phosphatase: 82 U/L (ref 38–126)
Anion gap: 10 (ref 5–15)
BUN: 14 mg/dL (ref 8–23)
CO2: 27 mmol/L (ref 22–32)
Calcium: 9.3 mg/dL (ref 8.9–10.3)
Chloride: 102 mmol/L (ref 98–111)
Creatinine: 0.93 mg/dL (ref 0.44–1.00)
GFR, Est AFR Am: >60 mL/min (ref >60)
GFR, Estimated: >60 mL/min (ref >60)
Glucose, Bld: 148 mg/dL — ABNORMAL HIGH (ref 70–99)
Potassium: 4.1 mmol/L (ref 3.5–5.1)
Sodium: 139 mmol/L (ref 135–145)
Total Bilirubin: 0.4 mg/dL (ref 0.3–1.2)
Total Protein: 7.2 g/dL (ref 6.5–8.1)

## 2018-11-02 LAB — CBC WITH DIFFERENTIAL (CANCER CENTER ONLY)
Abs Immature Granulocytes: 0 10*3/uL (ref 0.00–0.07)
Basophils Absolute: 0 10*3/uL (ref 0.0–0.1)
Basophils Relative: 0 %
Eosinophils Absolute: 0.1 10*3/uL (ref 0.0–0.5)
Eosinophils Relative: 3 %
HCT: 34.1 % — ABNORMAL LOW (ref 36.0–46.0)
Hemoglobin: 10.4 g/dL — ABNORMAL LOW (ref 12.0–15.0)
Immature Granulocytes: 0 %
Lymphocytes Relative: 50 %
Lymphs Abs: 1.1 10*3/uL (ref 0.7–4.0)
MCH: 23.1 pg — ABNORMAL LOW (ref 26.0–34.0)
MCHC: 30.5 g/dL (ref 30.0–36.0)
MCV: 75.8 fL — ABNORMAL LOW (ref 80.0–100.0)
Monocytes Absolute: 0.1 10*3/uL (ref 0.1–1.0)
Monocytes Relative: 5 %
Neutro Abs: 0.9 10*3/uL — ABNORMAL LOW (ref 1.7–7.7)
Neutrophils Relative %: 42 %
Platelet Count: 305 10*3/uL (ref 150–400)
RBC: 4.5 MIL/uL (ref 3.87–5.11)
RDW: 16.3 % — ABNORMAL HIGH (ref 11.5–15.5)
WBC Count: 2.2 10*3/uL — ABNORMAL LOW (ref 4.0–10.5)
nRBC: 0 % (ref 0.0–0.2)

## 2018-11-02 MED ORDER — SODIUM CHLORIDE 0.9% FLUSH
10.0000 mL | INTRAVENOUS | Status: DC | PRN
  Administered 2018-11-02: 10:00:00 10 mL
  Filled 2018-11-02: qty 10

## 2018-11-02 MED ORDER — HEPARIN SOD (PORK) LOCK FLUSH 100 UNIT/ML IV SOLN
500.0000 [IU] | Freq: Once | INTRAVENOUS | Status: AC | PRN
  Administered 2018-11-02: 500 [IU]
  Filled 2018-11-02: qty 5

## 2018-11-09 ENCOUNTER — Inpatient Hospital Stay: Payer: Medicare Other

## 2018-11-09 ENCOUNTER — Inpatient Hospital Stay: Payer: Medicare Other | Attending: Physician Assistant

## 2018-11-09 ENCOUNTER — Other Ambulatory Visit: Payer: Self-pay

## 2018-11-09 DIAGNOSIS — Z79899 Other long term (current) drug therapy: Secondary | ICD-10-CM | POA: Diagnosis not present

## 2018-11-09 DIAGNOSIS — Z5111 Encounter for antineoplastic chemotherapy: Secondary | ICD-10-CM | POA: Diagnosis not present

## 2018-11-09 DIAGNOSIS — C3412 Malignant neoplasm of upper lobe, left bronchus or lung: Secondary | ICD-10-CM | POA: Diagnosis not present

## 2018-11-09 DIAGNOSIS — C3490 Malignant neoplasm of unspecified part of unspecified bronchus or lung: Secondary | ICD-10-CM

## 2018-11-09 DIAGNOSIS — Z95828 Presence of other vascular implants and grafts: Secondary | ICD-10-CM

## 2018-11-09 DIAGNOSIS — Z5112 Encounter for antineoplastic immunotherapy: Secondary | ICD-10-CM | POA: Insufficient documentation

## 2018-11-09 LAB — CBC WITH DIFFERENTIAL (CANCER CENTER ONLY)
Abs Immature Granulocytes: 0 10*3/uL (ref 0.00–0.07)
Basophils Absolute: 0 10*3/uL (ref 0.0–0.1)
Basophils Relative: 0 %
Eosinophils Absolute: 0.1 10*3/uL (ref 0.0–0.5)
Eosinophils Relative: 3 %
HCT: 31.2 % — ABNORMAL LOW (ref 36.0–46.0)
Hemoglobin: 9.7 g/dL — ABNORMAL LOW (ref 12.0–15.0)
Immature Granulocytes: 0 %
Lymphocytes Relative: 35 %
Lymphs Abs: 1.1 10*3/uL (ref 0.7–4.0)
MCH: 23.8 pg — ABNORMAL LOW (ref 26.0–34.0)
MCHC: 31.1 g/dL (ref 30.0–36.0)
MCV: 76.5 fL — ABNORMAL LOW (ref 80.0–100.0)
Monocytes Absolute: 0.6 10*3/uL (ref 0.1–1.0)
Monocytes Relative: 18 %
Neutro Abs: 1.4 10*3/uL — ABNORMAL LOW (ref 1.7–7.7)
Neutrophils Relative %: 44 %
Platelet Count: 140 10*3/uL — ABNORMAL LOW (ref 150–400)
RBC: 4.08 MIL/uL (ref 3.87–5.11)
RDW: 17.1 % — ABNORMAL HIGH (ref 11.5–15.5)
WBC Count: 3.1 10*3/uL — ABNORMAL LOW (ref 4.0–10.5)
nRBC: 0 % (ref 0.0–0.2)

## 2018-11-09 LAB — CMP (CANCER CENTER ONLY)
ALT: 21 U/L (ref 0–44)
AST: 20 U/L (ref 15–41)
Albumin: 3.5 g/dL (ref 3.5–5.0)
Alkaline Phosphatase: 78 U/L (ref 38–126)
Anion gap: 10 (ref 5–15)
BUN: 11 mg/dL (ref 8–23)
CO2: 28 mmol/L (ref 22–32)
Calcium: 9 mg/dL (ref 8.9–10.3)
Chloride: 104 mmol/L (ref 98–111)
Creatinine: 0.92 mg/dL (ref 0.44–1.00)
GFR, Est AFR Am: >60 mL/min (ref >60)
GFR, Estimated: >60 mL/min (ref >60)
Glucose, Bld: 161 mg/dL — ABNORMAL HIGH (ref 70–99)
Potassium: 3.7 mmol/L (ref 3.5–5.1)
Sodium: 142 mmol/L (ref 135–145)
Total Bilirubin: 0.2 mg/dL — ABNORMAL LOW (ref 0.3–1.2)
Total Protein: 7 g/dL (ref 6.5–8.1)

## 2018-11-09 MED ORDER — HEPARIN SOD (PORK) LOCK FLUSH 100 UNIT/ML IV SOLN
500.0000 [IU] | Freq: Once | INTRAVENOUS | Status: AC | PRN
  Administered 2018-11-09: 500 [IU]
  Filled 2018-11-09: qty 5

## 2018-11-09 MED ORDER — SODIUM CHLORIDE 0.9% FLUSH
10.0000 mL | INTRAVENOUS | Status: DC | PRN
  Administered 2018-11-09: 10 mL
  Filled 2018-11-09: qty 10

## 2018-11-09 NOTE — Patient Instructions (Signed)

## 2018-11-14 NOTE — Progress Notes (Signed)
  Patient Name: RUTHMARY OCCHIPINTI MRN: [id number] DOB: 19-Sep-1955 Referring Physician: Curt Bears (Profile Not Attached) Date of Service: 09/22/2018 [facility] [place], [place]                                                        End Of Treatment Note  Diagnoses: C79.31-Secondary malignant neoplasm of brain  Intent: Palliative  Radiation Treatment Dates: 09/22/2018 through 09/22/2018 Site Technique Total Dose Dose per Fx Completed Fx Beam Energies  Brain: Brain_SRS IMRT 20/20 20 1/1 [id number]   Narrative: The patient tolerated radiation therapy relatively well. [name] received stereotactic radiosurgery to the following targets: ExacTrac, 6 vmat beams max dose = 127.8% PTV1 Ant Rt Frontal 37mm PTV2 Lt Frontal 62mm PTV3 Rt Frontal 66mm PTV4 Rt Parietal 5mm PTV5 Lt Parietal 8mm PTV6 Lt Parietal 69mm PTV7 Lt Temporal 74mm PTV8 Post Rt Frontal 79mm PTV9 Lt Sup Temporal 73mm   Plan: The patient will follow-up with radiation oncology in one month .  -----------------------------------  Eppie Gibson, MD

## 2018-11-16 ENCOUNTER — Inpatient Hospital Stay: Payer: Medicare Other

## 2018-11-16 ENCOUNTER — Inpatient Hospital Stay (HOSPITAL_BASED_OUTPATIENT_CLINIC_OR_DEPARTMENT_OTHER): Payer: Medicare Other | Admitting: Internal Medicine

## 2018-11-16 ENCOUNTER — Other Ambulatory Visit: Payer: Self-pay

## 2018-11-16 ENCOUNTER — Telehealth: Payer: Self-pay | Admitting: Internal Medicine

## 2018-11-16 ENCOUNTER — Other Ambulatory Visit: Payer: Medicare Other

## 2018-11-16 ENCOUNTER — Ambulatory Visit: Payer: Medicare Other

## 2018-11-16 ENCOUNTER — Encounter: Payer: Self-pay | Admitting: Internal Medicine

## 2018-11-16 ENCOUNTER — Ambulatory Visit: Payer: Medicare Other | Admitting: Physician Assistant

## 2018-11-16 VITALS — BP 144/75 | HR 60 | Temp 98.6°F | Resp 17 | Ht 60.5 in | Wt 182.6 lb

## 2018-11-16 DIAGNOSIS — I25118 Atherosclerotic heart disease of native coronary artery with other forms of angina pectoris: Secondary | ICD-10-CM | POA: Diagnosis not present

## 2018-11-16 DIAGNOSIS — C3490 Malignant neoplasm of unspecified part of unspecified bronchus or lung: Secondary | ICD-10-CM

## 2018-11-16 DIAGNOSIS — C3492 Malignant neoplasm of unspecified part of left bronchus or lung: Secondary | ICD-10-CM

## 2018-11-16 DIAGNOSIS — C3412 Malignant neoplasm of upper lobe, left bronchus or lung: Secondary | ICD-10-CM | POA: Diagnosis not present

## 2018-11-16 DIAGNOSIS — Z79899 Other long term (current) drug therapy: Secondary | ICD-10-CM | POA: Diagnosis not present

## 2018-11-16 DIAGNOSIS — R5382 Chronic fatigue, unspecified: Secondary | ICD-10-CM

## 2018-11-16 DIAGNOSIS — Z5111 Encounter for antineoplastic chemotherapy: Secondary | ICD-10-CM

## 2018-11-16 DIAGNOSIS — Z5112 Encounter for antineoplastic immunotherapy: Secondary | ICD-10-CM | POA: Diagnosis not present

## 2018-11-16 DIAGNOSIS — C7931 Secondary malignant neoplasm of brain: Secondary | ICD-10-CM

## 2018-11-16 DIAGNOSIS — I10 Essential (primary) hypertension: Secondary | ICD-10-CM

## 2018-11-16 DIAGNOSIS — Z95828 Presence of other vascular implants and grafts: Secondary | ICD-10-CM

## 2018-11-16 LAB — CBC WITH DIFFERENTIAL (CANCER CENTER ONLY)
Abs Immature Granulocytes: 0.02 10*3/uL (ref 0.00–0.07)
Basophils Absolute: 0 10*3/uL (ref 0.0–0.1)
Basophils Relative: 0 %
Eosinophils Absolute: 0.1 10*3/uL (ref 0.0–0.5)
Eosinophils Relative: 2 %
HCT: 33 % — ABNORMAL LOW (ref 36.0–46.0)
Hemoglobin: 10 g/dL — ABNORMAL LOW (ref 12.0–15.0)
Immature Granulocytes: 0 %
Lymphocytes Relative: 31 %
Lymphs Abs: 1.6 10*3/uL (ref 0.7–4.0)
MCH: 23.8 pg — ABNORMAL LOW (ref 26.0–34.0)
MCHC: 30.3 g/dL (ref 30.0–36.0)
MCV: 78.4 fL — ABNORMAL LOW (ref 80.0–100.0)
Monocytes Absolute: 0.9 10*3/uL (ref 0.1–1.0)
Monocytes Relative: 17 %
Neutro Abs: 2.6 10*3/uL (ref 1.7–7.7)
Neutrophils Relative %: 50 %
Platelet Count: 369 10*3/uL (ref 150–400)
RBC: 4.21 MIL/uL (ref 3.87–5.11)
RDW: 18.3 % — ABNORMAL HIGH (ref 11.5–15.5)
WBC Count: 5.3 10*3/uL (ref 4.0–10.5)
nRBC: 0 % (ref 0.0–0.2)

## 2018-11-16 LAB — CMP (CANCER CENTER ONLY)
ALT: 20 U/L (ref 0–44)
AST: 25 U/L (ref 15–41)
Albumin: 3.5 g/dL (ref 3.5–5.0)
Alkaline Phosphatase: 80 U/L (ref 38–126)
Anion gap: 9 (ref 5–15)
BUN: 8 mg/dL (ref 8–23)
CO2: 26 mmol/L (ref 22–32)
Calcium: 8.9 mg/dL (ref 8.9–10.3)
Chloride: 104 mmol/L (ref 98–111)
Creatinine: 1.01 mg/dL — ABNORMAL HIGH (ref 0.44–1.00)
GFR, Est AFR Am: >60 mL/min (ref >60)
GFR, Estimated: 58 mL/min — ABNORMAL LOW (ref >60)
Glucose, Bld: 140 mg/dL — ABNORMAL HIGH (ref 70–99)
Potassium: 3.5 mmol/L (ref 3.5–5.1)
Sodium: 139 mmol/L (ref 135–145)
Total Bilirubin: <0.2 mg/dL — ABNORMAL LOW (ref 0.3–1.2)
Total Protein: 7.1 g/dL (ref 6.5–8.1)

## 2018-11-16 LAB — TSH: TSH: 1.644 u[IU]/mL (ref 0.308–3.960)

## 2018-11-16 MED ORDER — SODIUM CHLORIDE 0.9% FLUSH
10.0000 mL | INTRAVENOUS | Status: DC | PRN
  Administered 2018-11-16: 10 mL
  Filled 2018-11-16: qty 10

## 2018-11-16 MED ORDER — SODIUM CHLORIDE 0.9 % IV SOLN
480.5000 mg | Freq: Once | INTRAVENOUS | Status: AC
  Administered 2018-11-16: 12:00:00 480 mg via INTRAVENOUS
  Filled 2018-11-16: qty 48

## 2018-11-16 MED ORDER — CYANOCOBALAMIN 1000 MCG/ML IJ SOLN
INTRAMUSCULAR | Status: AC
  Filled 2018-11-16: qty 1

## 2018-11-16 MED ORDER — CYANOCOBALAMIN 1000 MCG/ML IJ SOLN
1000.0000 ug | Freq: Once | INTRAMUSCULAR | Status: AC
  Administered 2018-11-16: 10:00:00 1000 ug via INTRAMUSCULAR

## 2018-11-16 MED ORDER — SODIUM CHLORIDE 0.9 % IV SOLN
485.0000 mg/m2 | Freq: Once | INTRAVENOUS | Status: AC
  Administered 2018-11-16: 900 mg via INTRAVENOUS
  Filled 2018-11-16: qty 20

## 2018-11-16 MED ORDER — SODIUM CHLORIDE 0.9 % IV SOLN
Freq: Once | INTRAVENOUS | Status: AC
  Administered 2018-11-16: 10:00:00 via INTRAVENOUS
  Filled 2018-11-16: qty 5

## 2018-11-16 MED ORDER — SODIUM CHLORIDE 0.9 % IV SOLN
Freq: Once | INTRAVENOUS | Status: AC
  Administered 2018-11-16: 10:00:00 via INTRAVENOUS
  Filled 2018-11-16: qty 250

## 2018-11-16 MED ORDER — SODIUM CHLORIDE 0.9 % IV SOLN
200.0000 mg | Freq: Once | INTRAVENOUS | Status: AC
  Administered 2018-11-16: 11:00:00 200 mg via INTRAVENOUS
  Filled 2018-11-16: qty 8

## 2018-11-16 MED ORDER — HEPARIN SOD (PORK) LOCK FLUSH 100 UNIT/ML IV SOLN
500.0000 [IU] | Freq: Once | INTRAVENOUS | Status: AC | PRN
  Administered 2018-11-16: 500 [IU]
  Filled 2018-11-16: qty 5

## 2018-11-16 MED ORDER — PALONOSETRON HCL INJECTION 0.25 MG/5ML
0.2500 mg | Freq: Once | INTRAVENOUS | Status: AC
  Administered 2018-11-16: 10:00:00 0.25 mg via INTRAVENOUS

## 2018-11-16 MED ORDER — PALONOSETRON HCL INJECTION 0.25 MG/5ML
INTRAVENOUS | Status: AC
  Filled 2018-11-16: qty 5

## 2018-11-16 NOTE — Patient Instructions (Signed)
[facility] Discharge Instructions for Patients Receiving Chemotherapy  Today you received the following chemotherapy agents Pembrolizumab (KEYTRUDA), Pemetrexed (ALIMTA) & Carboplatin (PARAPLATIN).  To help prevent nausea and vomiting after your treatment, we encourage you to take your nausea medication as prescribed.  If you develop nausea and vomiting that is not controlled by your nausea medication, call the clinic.   BELOW ARE SYMPTOMS THAT SHOULD BE REPORTED IMMEDIATELY:  *FEVER GREATER THAN 100.5 F  *CHILLS WITH OR WITHOUT FEVER  NAUSEA AND VOMITING THAT IS NOT CONTROLLED WITH YOUR NAUSEA MEDICATION  *UNUSUAL SHORTNESS OF BREATH  *UNUSUAL BRUISING OR BLEEDING  TENDERNESS IN MOUTH AND THROAT WITH OR WITHOUT PRESENCE OF ULCERS  *URINARY PROBLEMS  *BOWEL PROBLEMS  UNUSUAL RASH Items with * indicate a potential emergency and should be followed up as soon as possible.  Feel free to call the clinic should you have any questions or concerns. The clinic phone number is [phone].  Please show the [place] [facility] at check-in to the Emergency Department and triage nurse.  Cyanocobalamin, Pyridoxine, and Folate What is this medicine? A multivitamin containing folic acid, vitamin B6, and vitamin B12. This medicine may be used for other purposes; ask your health care provider or pharmacist if you have questions. COMMON BRAND NAME(S): AllanFol RX, AllanTex, Av-Vite FB, B Complex with Folic Acid, ComBgen, FaBB, Folamin, Folastin, [place], [place], [place], [place], [place], [employer], [place] RX 2.2, [place], [place] 2.2, Foltabs 800, Foltx, Homocysteine Formula, Niva-Fol, NuFol, TL [employer], Virt-Gard, Virt-Vite, Virt-Vite [place], Vita-Respa What should I tell my health care provider before I take this medicine? They need to know if you have any of these conditions:  bleeding or clotting disorder  history of anemia of any type  other chronic health  condition  an unusual or allergic reaction to vitamins, other medicines, foods, dyes, or preservatives  pregnant or trying to get pregnant  breast-feeding How should I use this medicine? Take by mouth with a glass of water. May take with food. Follow the directions on the prescription label. It is usually given once a day. Do not take your medicine more often than directed. Contact your pediatrician regarding the use of this medicine in children. Special care may be needed. Overdosage: If you think you have taken too much of this medicine contact a poison control center or emergency room at once. NOTE: This medicine is only for you. Do not share this medicine with others. What if I miss a dose? If you miss a dose, take it as soon as you can. If it is almost time for your next dose, take only that dose. Do not take double or extra doses. What may interact with this medicine?  levodopa This list may not describe all possible interactions. Give your health care provider a list of all the medicines, herbs, non-prescription drugs, or dietary supplements you use. Also tell them if you smoke, drink alcohol, or use illegal drugs. Some items may interact with your medicine. What should I watch for while using this medicine? See your health care professional for regular checks on your progress. Remember that vitamin supplements do not replace the need for good nutrition from a balanced diet. What side effects may I notice from receiving this medicine? Side effects that you should report to your doctor or health care professional as soon as possible:  allergic reaction such as skin rash or difficulty breathing  vomiting Side effects that usually do not require medical attention (report to  your doctor or health care professional if they continue or are bothersome):  nausea  stomach upset This list may not describe all possible side effects. Call your doctor for medical advice about side effects.  You may report side effects to FDA at 1-800-FDA-1088. Where should I keep my medicine? Keep out of the reach of children. Most vitamins should be stored at controlled room temperature. Check your specific product directions. Protect from heat and moisture. Throw away any unused medicine after the expiration date. NOTE: This sheet is a summary. It may not cover all possible information. If you have questions about this medicine, talk to your doctor, pharmacist, or health care provider.  2020 Elsevier/Gold Standard (2007-03-18 00:59:55)  Coronavirus (COVID-19) Are you at risk?  Are you at risk for the Coronavirus (COVID-19)?  To be considered HIGH RISK for Coronavirus (COVID-19), you have to meet the following criteria:  . Traveled to [place], [place], [place], [place] or [place]; or in the [place] to [place], [place], [place], or [place]; and have fever, cough, and shortness of breath within the last 2 weeks of travel OR . Been in close contact with a person diagnosed with COVID-19 within the last 2 weeks and have fever, cough, and shortness of breath . IF YOU DO NOT MEET THESE CRITERIA, YOU ARE CONSIDERED LOW RISK FOR COVID-19.  What to do if you are HIGH RISK for COVID-19?  Marland Kitchen If you are having a medical emergency, call 911. . Seek medical care right away. Before you go to a doctor's office, urgent care or emergency department, call ahead and tell them about your recent travel, contact with someone diagnosed with COVID-19, and your symptoms. You should receive instructions from your physician's office regarding next steps of care.  . When you arrive at healthcare provider, tell the healthcare staff immediately you have returned from visiting [place], [place], [place], [place] or [place]; or traveled in the [place] to [place], [place], [place], or [place]; in the last two weeks or you have been in close contact with a person diagnosed with COVID-19 in the last 2 weeks.    . Tell the health care staff about your symptoms: fever, cough and shortness of breath. . After you have been seen by a medical provider, you will be either: o Tested for (COVID-19) and discharged home on quarantine except to seek medical care if symptoms worsen, and asked to  - Stay home and avoid contact with others until you get your results (4-5 days)  - Avoid travel on public transportation if possible (such as bus, train, or airplane) or o Sent to the Emergency Department by EMS for evaluation, COVID-19 testing, and possible admission depending on your condition and test results.  What to do if you are LOW RISK for COVID-19?  Reduce your risk of any infection by using the same precautions used for avoiding the common cold or flu:  Marland Kitchen Wash your hands often with soap and warm water for at least 20 seconds.  If soap and water are not readily available, use an alcohol-based hand sanitizer with at least 60% alcohol.  . If coughing or sneezing, cover your mouth and nose by coughing or sneezing into the elbow areas of your shirt or coat, into a tissue or into your sleeve (not your hands). . Avoid shaking hands with others and consider head nods or verbal greetings only. . Avoid touching your eyes, nose, or mouth with unwashed hands.  Marland Kitchen  Avoid close contact with people who are sick. . Avoid places or events with large numbers of people in one location, like concerts or sporting events. . Carefully consider travel plans you have or are making. . If you are planning any travel outside or inside the [place], visit the CDC's Travelers' Health webpage for the latest health notices. . If you have some symptoms but not all symptoms, continue to monitor at home and seek medical attention if your symptoms worsen. . If you are having a medical emergency, call 911.   [place] / e-Visit: [email or web address]          [facility]: [facility]: [phone]                   [facility]: [phone]

## 2018-11-16 NOTE — Progress Notes (Signed)
[place] [phone]   [phone]  OFFICE PROGRESS NOTE  [name], MD [address] [address]  DIAGNOSIS: Stage IV (T1c, N1, M1c ) non-small cell lung cancer, adenocarcinoma diagnosed in July 2020 and presented with left upper lobe lung nodule in addition to right upper lobe lung nodule with left hilar lymphadenopathy as well as multiple brain metastasis.  Biomarker Findings Microsatellite status - MS-Stable Tumor Mutational Burden - 4 Muts/Mb Genomic Findings For a complete list of the genes assayed, please refer to the Appendix. ATM G204* KRAS G12C PDGFRA P122T SMO R199W 7 Disease relevant genes with no reportable alterations: ALK, BRAF, EGFR, ERBB2, MET, RET, ROS1  PDL 1 expression was 1%  PRIOR THERAPY: SBRT to multiple brain metastasis under the care of Dr. Isidore Moos.  CURRENT THERAPY: Systemic chemotherapy with carboplatin for AUC of 5, Alimta 500 mg/M2 and Keytruda 200 mg IV every 3 weeks.  First dose October 05, 2018.  Status post 2 cycles.  INTERVAL HISTORY: Carolyn [name] 63 y.o. female returns to the clinic today for follow-up visit.  The patient is feeling fine today with no concerning complaints.  She has been tolerating her systemic chemotherapy fairly well.  She denied having any current chest pain, shortness of breath, cough or hemoptysis.  She denied having any fever or chills.  She has no nausea, vomiting, diarrhea or constipation.  She has no headache or visual changes.  She is here today for evaluation before starting cycle #3.  MEDICAL HISTORY: Past Medical History:  Diagnosis Date  . Anginal pain ([place])    " with exertion "  . Arthritis    " 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 "  . Asthma    h/o  . Coronary artery disease   . Diabetes mellitus without complication ([place])    Type II  . GERD (gastroesophageal reflux disease)   . H/O hiatal hernia   . Heart murmur   . History of radiation therapy  09/22/2018   stereotactic radiosurgery   . 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 Hypertension   . Persistent headaches    h/o  . Pneumonia    hx of PNA  . Shortness of breath   . Vaginal dryness    h/o    ALLERGIES:  is allergic to lisinopril.  MEDICATIONS:  Current Outpatient Medications  Medication Sig Dispense Refill  . acetaminophen (TYLENOL) 500 MG tablet Take 1,000 mg by mouth every 8 (eight) hours as needed for mild pain, fever or headache.     Marland Kitchen amLODipine (NORVASC) 5 MG tablet Take 5 mg by mouth at bedtime.     Marland Kitchen aspirin EC 81 MG tablet Take 81 mg by mouth daily.    . carvedilol (COREG) 25 MG tablet Take 12.5 mg by mouth 2 (two) times daily with a meal.     . cetirizine (ZYRTEC) 10 MG tablet Take 10 mg by mouth daily.    . Cholecalciferol (VITAMIN D-3) 125 MCG (5000 UT) TABS Take 5,000 Units by mouth daily.    . Chromium Picolinate 1000 MCG TABS Take 1,000 mg by mouth daily.    Marland Kitchen co-enzyme Q-10 30 MG capsule Take 30 mg by mouth daily.     Marland Kitchen esomeprazole (NEXIUM) 20 MG capsule Take 20 mg by mouth daily at 12 noon.    . ferrous sulfate 325 (65 FE) MG tablet Take 325 mg by mouth daily with breakfast.    .  folic acid (FOLVITE) 1 MG tablet Take 1 tablet (1 mg total) by mouth daily. 30 tablet 4  . Ginger, Zingiber officinalis, (GINGER ROOT) 500 MG CAPS Take 500 mg by mouth daily.    . hydrochlorothiazide (MICROZIDE) 12.5 MG capsule Take 12.5 mg by mouth daily.    Javier Docker Oil 1000 MG CAPS Take 1,000 mg by mouth daily.    Marland Kitchen lidocaine-prilocaine (EMLA) cream Apply to the Port-A-Cath site 30 to 60 minutes before chemotherapy. 30 g 0  . metFORMIN (GLUCOPHAGE) 500 MG tablet Take 500 mg by mouth 2 (two) times daily with a meal.     . methocarbamol (ROBAXIN) 500 MG tablet Take 1 tablet (500 mg total) by mouth every 6 (six) hours as needed for muscle spasms. 21 tablet 0  . nitroGLYCERIN (NITROSTAT) 0.4 MG SL tablet Place 1 tablet (0.4 mg total) under the tongue every 5 (five) minutes as needed for  chest pain. (Patient not taking: Reported on 09/12/2018) 30 tablet 12  . ONETOUCH ULTRA test strip USE 1 STRIP TO CHECK GLUCOSE THREE TIMES DAILY    . Pitavastatin Calcium (LIVALO) 4 MG TABS Take 4 mg by mouth at bedtime.     Vladimir Faster Glycol-Propyl Glycol (SYSTANE) 0.4-0.3 % SOLN Place 1 drop into both eyes every 6 (six) hours as needed (dry eyes).     . prochlorperazine (COMPAZINE) 10 MG tablet Take 1 tablet (10 mg total) by mouth every 6 (six) hours as needed for nausea or vomiting. 30 tablet 0  . psyllium (METAMUCIL) 58.6 % packet Take 1 packet by mouth at bedtime as needed (constipation).     Marland Kitchen telmisartan (MICARDIS) 40 MG tablet Take 40 mg by mouth daily.    . Turmeric 500 MG CAPS Take 500 mg by mouth daily.    Marland Kitchen zolpidem (AMBIEN) 10 MG tablet Take 2.5 mg by mouth at bedtime.      No current facility-administered medications for this visit.     SURGICAL HISTORY:  Past Surgical History:  Procedure Laterality Date  . ABDOMINAL HYSTERECTOMY    . bone spur    . CARDIAC CATHETERIZATION  06/13/2012  . carpel tunnel surgery Left   . COLONOSCOPY     9 years ago  . CORONARY ANGIOPLASTY  06/13/2012  . EYE SURGERY     cyst left eye   . IR IMAGING GUIDED PORT INSERTION  10/10/2018  . LEFT HEART CATHETERIZATION WITH CORONARY ANGIOGRAM N/A 06/13/2012   Procedure: LEFT HEART CATHETERIZATION WITH CORONARY ANGIOGRAM;  Surgeon: Laverda Page, MD;  Location: [facility] CATH [facility];  Service: Cardiovascular;  Laterality: N/A;  . NECK SURGERY    . rotator cuff surgery Right 2015  . VIDEO BRONCHOSCOPY WITH ENDOBRONCHIAL NAVIGATION N/A 09/06/2018   Procedure: VIDEO BRONCHOSCOPY WITH ENDOBRONCHIAL NAVIGATION, left upper lung;  Surgeon: Lajuana Matte, MD;  Location: [facility];  Service: Thoracic;  Laterality: N/A;  . VIDEO BRONCHOSCOPY WITH ENDOBRONCHIAL ULTRASOUND Left 09/06/2018   Procedure: VIDEO BRONCHOSCOPY WITH ENDOBRONCHIAL ULTRASOUND, left lung;  Surgeon: Lajuana Matte, MD;  Location: [facility];   Service: Thoracic;  Laterality: Left;  . WISDOM TOOTH EXTRACTION      REVIEW OF SYSTEMS:  A comprehensive review of systems was negative.   PHYSICAL EXAMINATION: General appearance: alert, cooperative and no distress Head: Normocephalic, without obvious abnormality, atraumatic Neck: no adenopathy, no JVD, supple, symmetrical, trachea midline and thyroid not enlarged, symmetric, no tenderness/mass/nodules Lymph nodes: Cervical, supraclavicular, and axillary nodes normal. Resp: clear to auscultation bilaterally Back: symmetric,  no curvature. ROM normal. No CVA tenderness. Cardio: regular rate and rhythm, S1, S2 normal, no murmur, click, rub or gallop GI: soft, non-tender; bowel sounds normal; no masses,  no organomegaly Extremities: extremities normal, atraumatic, no cyanosis or edema  ECOG PERFORMANCE STATUS: 1 - Symptomatic but completely ambulatory  Blood pressure (!) 144/75, pulse 60, temperature 98.6 F (37 C), temperature source Temporal, resp. rate 17, height 5' 0.5" (1.537 m), weight 182 lb 9.6 oz (82.8 kg), SpO2 100 %.  LABORATORY DATA: Lab Results  Component Value Date   WBC 5.3 11/16/2018   HGB 10.0 (L) 11/16/2018   HCT 33.0 (L) 11/16/2018   MCV 78.4 (L) 11/16/2018   PLT 369 11/16/2018      Chemistry      Component Value Date/Time   NA 139 11/16/2018 0847   K 3.5 11/16/2018 0847   CL 104 11/16/2018 0847   CO2 26 11/16/2018 0847   BUN 8 11/16/2018 0847   CREATININE 1.01 (H) 11/16/2018 0847      Component Value Date/Time   CALCIUM 8.9 11/16/2018 0847   ALKPHOS 80 11/16/2018 0847   AST 25 11/16/2018 0847   ALT 20 11/16/2018 0847   BILITOT <0.2 (L) 11/16/2018 0847       RADIOGRAPHIC STUDIES: No results found.  ASSESSMENT AND PLAN: This is a very pleasant 63 years old African-American female with likely stage IV (T1c, N1, M1C) lung cancer pending tissue diagnosis and presented with left upper lobe lung nodule in addition to right upper lobe pulmonary nodule  as well as left hilar adenopathy and metastatic lesion to the brain. Molecular studies showed no actionable mutation and PDL 1 expression was 1%. The patient underwent SBRT to the metastatic brain lesion under the care of Dr. Isidore Moos. The patient is currently undergoing systemic chemotherapy with carboplatin for AUC of 5, Alimta 500 mg/M2 and Keytruda 200 mg IV every 3 weeks is status post 2 cycles.  She has been tolerating this treatment well with no concerning adverse effects. I recommended for the patient to proceed with cycle #3 today as planned. I will see her back for follow-up visit in 3 weeks for evaluation before starting cycle number city and with repeat CT scan of the chest, abdomen and pelvis for restaging of her disease. The patient was advised to call immediately if she has any concerning symptoms in the interval. The patient voices understanding of current disease status and treatment options and is in agreement with the current care plan.  All questions were answered. The patient knows to call the clinic with any problems, questions or concerns. We can certainly see the patient much sooner if necessary.  I spent 10 minutes counseling the patient face to face. The total time spent in the appointment was 15 minutes.  Disclaimer: This note was dictated with voice recognition software. Similar sounding words can inadvertently be transcribed and may not be corrected upon review.

## 2018-11-16 NOTE — Telephone Encounter (Signed)
Scheduled appt per 10/8 los - pt to get an updated schedule in treatment area - RN aware to print

## 2018-11-23 ENCOUNTER — Inpatient Hospital Stay: Payer: Medicare Other

## 2018-11-23 ENCOUNTER — Other Ambulatory Visit: Payer: Self-pay

## 2018-11-23 DIAGNOSIS — Z5112 Encounter for antineoplastic immunotherapy: Secondary | ICD-10-CM | POA: Diagnosis not present

## 2018-11-23 DIAGNOSIS — Z79899 Other long term (current) drug therapy: Secondary | ICD-10-CM | POA: Diagnosis not present

## 2018-11-23 DIAGNOSIS — C3490 Malignant neoplasm of unspecified part of unspecified bronchus or lung: Secondary | ICD-10-CM

## 2018-11-23 DIAGNOSIS — Z5111 Encounter for antineoplastic chemotherapy: Secondary | ICD-10-CM | POA: Diagnosis not present

## 2018-11-23 DIAGNOSIS — C3412 Malignant neoplasm of upper lobe, left bronchus or lung: Secondary | ICD-10-CM | POA: Diagnosis not present

## 2018-11-23 DIAGNOSIS — Z95828 Presence of other vascular implants and grafts: Secondary | ICD-10-CM

## 2018-11-23 LAB — CMP (CANCER CENTER ONLY)
ALT: 19 U/L (ref 0–44)
AST: 25 U/L (ref 15–41)
Albumin: 3.5 g/dL (ref 3.5–5.0)
Alkaline Phosphatase: 70 U/L (ref 38–126)
Anion gap: 9 (ref 5–15)
BUN: 14 mg/dL (ref 8–23)
CO2: 29 mmol/L (ref 22–32)
Calcium: 9.1 mg/dL (ref 8.9–10.3)
Chloride: 101 mmol/L (ref 98–111)
Creatinine: 0.88 mg/dL (ref 0.44–1.00)
GFR, Est AFR Am: >60 mL/min (ref >60)
GFR, Estimated: >60 mL/min (ref >60)
Glucose, Bld: 100 mg/dL — ABNORMAL HIGH (ref 70–99)
Potassium: 3.9 mmol/L (ref 3.5–5.1)
Sodium: 139 mmol/L (ref 135–145)
Total Bilirubin: 0.6 mg/dL (ref 0.3–1.2)
Total Protein: 7.1 g/dL (ref 6.5–8.1)

## 2018-11-23 LAB — CBC WITH DIFFERENTIAL (CANCER CENTER ONLY)
Abs Immature Granulocytes: 0 10*3/uL (ref 0.00–0.07)
Basophils Absolute: 0 10*3/uL (ref 0.0–0.1)
Basophils Relative: 1 %
Eosinophils Absolute: 0 10*3/uL (ref 0.0–0.5)
Eosinophils Relative: 1 %
HCT: 31.3 % — ABNORMAL LOW (ref 36.0–46.0)
Hemoglobin: 9.9 g/dL — ABNORMAL LOW (ref 12.0–15.0)
Immature Granulocytes: 0 %
Lymphocytes Relative: 50 %
Lymphs Abs: 1 10*3/uL (ref 0.7–4.0)
MCH: 23.9 pg — ABNORMAL LOW (ref 26.0–34.0)
MCHC: 31.6 g/dL (ref 30.0–36.0)
MCV: 75.6 fL — ABNORMAL LOW (ref 80.0–100.0)
Monocytes Absolute: 0.2 10*3/uL (ref 0.1–1.0)
Monocytes Relative: 11 %
Neutro Abs: 0.7 10*3/uL — ABNORMAL LOW (ref 1.7–7.7)
Neutrophils Relative %: 37 %
Platelet Count: 237 10*3/uL (ref 150–400)
RBC: 4.14 MIL/uL (ref 3.87–5.11)
RDW: 17.6 % — ABNORMAL HIGH (ref 11.5–15.5)
WBC Count: 1.9 10*3/uL — ABNORMAL LOW (ref 4.0–10.5)
nRBC: 0 % (ref 0.0–0.2)

## 2018-11-23 MED ORDER — SODIUM CHLORIDE 0.9% FLUSH
10.0000 mL | INTRAVENOUS | Status: DC | PRN
  Administered 2018-11-23: 10 mL
  Filled 2018-11-23: qty 10

## 2018-11-23 MED ORDER — HEPARIN SOD (PORK) LOCK FLUSH 100 UNIT/ML IV SOLN
500.0000 [IU] | Freq: Once | INTRAVENOUS | Status: AC | PRN
  Administered 2018-11-23: 500 [IU]
  Filled 2018-11-23: qty 5

## 2018-11-23 NOTE — Patient Instructions (Signed)

## 2018-11-30 ENCOUNTER — Other Ambulatory Visit: Payer: Self-pay | Admitting: Radiation Therapy

## 2018-11-30 ENCOUNTER — Inpatient Hospital Stay: Payer: Medicare Other

## 2018-11-30 ENCOUNTER — Other Ambulatory Visit: Payer: Self-pay

## 2018-11-30 DIAGNOSIS — C3412 Malignant neoplasm of upper lobe, left bronchus or lung: Secondary | ICD-10-CM | POA: Diagnosis not present

## 2018-11-30 DIAGNOSIS — Z5112 Encounter for antineoplastic immunotherapy: Secondary | ICD-10-CM | POA: Diagnosis not present

## 2018-11-30 DIAGNOSIS — C7931 Secondary malignant neoplasm of brain: Secondary | ICD-10-CM

## 2018-11-30 DIAGNOSIS — Z95828 Presence of other vascular implants and grafts: Secondary | ICD-10-CM

## 2018-11-30 DIAGNOSIS — C3490 Malignant neoplasm of unspecified part of unspecified bronchus or lung: Secondary | ICD-10-CM

## 2018-11-30 DIAGNOSIS — C7949 Secondary malignant neoplasm of other parts of nervous system: Secondary | ICD-10-CM

## 2018-11-30 DIAGNOSIS — Z5111 Encounter for antineoplastic chemotherapy: Secondary | ICD-10-CM | POA: Diagnosis not present

## 2018-11-30 DIAGNOSIS — Z79899 Other long term (current) drug therapy: Secondary | ICD-10-CM | POA: Diagnosis not present

## 2018-11-30 LAB — CMP (CANCER CENTER ONLY)
ALT: 17 U/L (ref 0–44)
AST: 20 U/L (ref 15–41)
Albumin: 3.6 g/dL (ref 3.5–5.0)
Alkaline Phosphatase: 74 U/L (ref 38–126)
Anion gap: 12 (ref 5–15)
BUN: 9 mg/dL (ref 8–23)
CO2: 25 mmol/L (ref 22–32)
Calcium: 9.2 mg/dL (ref 8.9–10.3)
Chloride: 104 mmol/L (ref 98–111)
Creatinine: 0.94 mg/dL (ref 0.44–1.00)
GFR, Est AFR Am: >60 mL/min (ref >60)
GFR, Estimated: >60 mL/min (ref >60)
Glucose, Bld: 130 mg/dL — ABNORMAL HIGH (ref 70–99)
Potassium: 3.6 mmol/L (ref 3.5–5.1)
Sodium: 141 mmol/L (ref 135–145)
Total Bilirubin: <0.2 mg/dL — ABNORMAL LOW (ref 0.3–1.2)
Total Protein: 7 g/dL (ref 6.5–8.1)

## 2018-11-30 LAB — CBC WITH DIFFERENTIAL (CANCER CENTER ONLY)
Abs Immature Granulocytes: 0.01 10*3/uL (ref 0.00–0.07)
Basophils Absolute: 0 10*3/uL (ref 0.0–0.1)
Basophils Relative: 0 %
Eosinophils Absolute: 0 10*3/uL (ref 0.0–0.5)
Eosinophils Relative: 1 %
HCT: 29.3 % — ABNORMAL LOW (ref 36.0–46.0)
Hemoglobin: 9.1 g/dL — ABNORMAL LOW (ref 12.0–15.0)
Immature Granulocytes: 0 %
Lymphocytes Relative: 32 %
Lymphs Abs: 0.9 10*3/uL (ref 0.7–4.0)
MCH: 23.9 pg — ABNORMAL LOW (ref 26.0–34.0)
MCHC: 31.1 g/dL (ref 30.0–36.0)
MCV: 76.9 fL — ABNORMAL LOW (ref 80.0–100.0)
Monocytes Absolute: 0.6 10*3/uL (ref 0.1–1.0)
Monocytes Relative: 21 %
Neutro Abs: 1.2 10*3/uL — ABNORMAL LOW (ref 1.7–7.7)
Neutrophils Relative %: 46 %
Platelet Count: 108 10*3/uL — ABNORMAL LOW (ref 150–400)
RBC: 3.81 MIL/uL — ABNORMAL LOW (ref 3.87–5.11)
RDW: 18 % — ABNORMAL HIGH (ref 11.5–15.5)
WBC Count: 2.7 10*3/uL — ABNORMAL LOW (ref 4.0–10.5)
nRBC: 0 % (ref 0.0–0.2)

## 2018-11-30 MED ORDER — SODIUM CHLORIDE 0.9% FLUSH
10.0000 mL | INTRAVENOUS | Status: DC | PRN
  Administered 2018-11-30: 10 mL
  Filled 2018-11-30: qty 10

## 2018-11-30 MED ORDER — HEPARIN SOD (PORK) LOCK FLUSH 100 UNIT/ML IV SOLN
500.0000 [IU] | Freq: Once | INTRAVENOUS | Status: AC | PRN
  Administered 2018-11-30: 500 [IU]
  Filled 2018-11-30: qty 5

## 2018-11-30 NOTE — Patient Instructions (Signed)

## 2018-12-04 ENCOUNTER — Other Ambulatory Visit: Payer: Self-pay | Admitting: Radiation Therapy

## 2018-12-05 ENCOUNTER — Other Ambulatory Visit: Payer: Self-pay

## 2018-12-05 ENCOUNTER — Ambulatory Visit (HOSPITAL_COMMUNITY)
Admission: RE | Admit: 2018-12-05 | Discharge: 2018-12-05 | Disposition: A | Discharge status: Home / Self Care | Payer: Medicare Other | Source: Ambulatory Visit | Attending: Internal Medicine | Admitting: Internal Medicine

## 2018-12-05 ENCOUNTER — Encounter (HOSPITAL_COMMUNITY): Payer: Self-pay

## 2018-12-05 DIAGNOSIS — C3412 Malignant neoplasm of upper lobe, left bronchus or lung: Secondary | ICD-10-CM | POA: Diagnosis not present

## 2018-12-05 DIAGNOSIS — C3490 Malignant neoplasm of unspecified part of unspecified bronchus or lung: Secondary | ICD-10-CM | POA: Insufficient documentation

## 2018-12-05 HISTORY — DX: Malignant (primary) neoplasm, unspecified: C80.1

## 2018-12-05 MED ORDER — IOHEXOL 300 MG/ML  SOLN
100.0000 mL | Freq: Once | INTRAMUSCULAR | Status: AC | PRN
  Administered 2018-12-05: 100 mL via INTRAVENOUS

## 2018-12-05 MED ORDER — SODIUM CHLORIDE (PF) 0.9 % IJ SOLN
INTRAMUSCULAR | Status: AC
  Filled 2018-12-05: qty 50

## 2018-12-05 MED ORDER — HEPARIN SOD (PORK) LOCK FLUSH 100 UNIT/ML IV SOLN
INTRAVENOUS | Status: AC
  Filled 2018-12-05: qty 5

## 2018-12-05 MED ORDER — HEPARIN SOD (PORK) LOCK FLUSH 100 UNIT/ML IV SOLN
500.0000 [IU] | Freq: Once | INTRAVENOUS | Status: AC
  Administered 2018-12-05: 500 [IU] via INTRAVENOUS

## 2018-12-07 ENCOUNTER — Inpatient Hospital Stay: Payer: Medicare Other

## 2018-12-07 ENCOUNTER — Inpatient Hospital Stay (HOSPITAL_BASED_OUTPATIENT_CLINIC_OR_DEPARTMENT_OTHER): Payer: Medicare Other | Admitting: Physician Assistant

## 2018-12-07 ENCOUNTER — Other Ambulatory Visit: Payer: Self-pay

## 2018-12-07 ENCOUNTER — Encounter: Payer: Self-pay | Admitting: Physician Assistant

## 2018-12-07 VITALS — BP 149/72 | HR 69 | Temp 98.3°F | Resp 18 | Ht 60.5 in | Wt 182.0 lb

## 2018-12-07 DIAGNOSIS — I25118 Atherosclerotic heart disease of native coronary artery with other forms of angina pectoris: Secondary | ICD-10-CM | POA: Diagnosis not present

## 2018-12-07 DIAGNOSIS — C3492 Malignant neoplasm of unspecified part of left bronchus or lung: Secondary | ICD-10-CM

## 2018-12-07 DIAGNOSIS — Z5111 Encounter for antineoplastic chemotherapy: Secondary | ICD-10-CM | POA: Diagnosis not present

## 2018-12-07 DIAGNOSIS — Z5112 Encounter for antineoplastic immunotherapy: Secondary | ICD-10-CM | POA: Diagnosis not present

## 2018-12-07 DIAGNOSIS — C7931 Secondary malignant neoplasm of brain: Secondary | ICD-10-CM

## 2018-12-07 DIAGNOSIS — Z95828 Presence of other vascular implants and grafts: Secondary | ICD-10-CM

## 2018-12-07 DIAGNOSIS — C3490 Malignant neoplasm of unspecified part of unspecified bronchus or lung: Secondary | ICD-10-CM

## 2018-12-07 DIAGNOSIS — R5382 Chronic fatigue, unspecified: Secondary | ICD-10-CM

## 2018-12-07 DIAGNOSIS — C3412 Malignant neoplasm of upper lobe, left bronchus or lung: Secondary | ICD-10-CM | POA: Diagnosis not present

## 2018-12-07 DIAGNOSIS — Z79899 Other long term (current) drug therapy: Secondary | ICD-10-CM | POA: Diagnosis not present

## 2018-12-07 LAB — CMP (CANCER CENTER ONLY)
ALT: 15 U/L (ref 0–44)
AST: 21 U/L (ref 15–41)
Albumin: 3.5 g/dL (ref 3.5–5.0)
Alkaline Phosphatase: 75 U/L (ref 38–126)
Anion gap: 12 (ref 5–15)
BUN: 10 mg/dL (ref 8–23)
CO2: 26 mmol/L (ref 22–32)
Calcium: 9 mg/dL (ref 8.9–10.3)
Chloride: 102 mmol/L (ref 98–111)
Creatinine: 1.05 mg/dL — ABNORMAL HIGH (ref 0.44–1.00)
GFR, Est AFR Am: >60 mL/min (ref >60)
GFR, Estimated: 56 mL/min — ABNORMAL LOW (ref >60)
Glucose, Bld: 136 mg/dL — ABNORMAL HIGH (ref 70–99)
Potassium: 3.3 mmol/L — ABNORMAL LOW (ref 3.5–5.1)
Sodium: 140 mmol/L (ref 135–145)
Total Bilirubin: <0.2 mg/dL — ABNORMAL LOW (ref 0.3–1.2)
Total Protein: 7 g/dL (ref 6.5–8.1)

## 2018-12-07 LAB — CBC WITH DIFFERENTIAL (CANCER CENTER ONLY)
Abs Immature Granulocytes: 0.02 10*3/uL (ref 0.00–0.07)
Basophils Absolute: 0 10*3/uL (ref 0.0–0.1)
Basophils Relative: 0 %
Eosinophils Absolute: 0.1 10*3/uL (ref 0.0–0.5)
Eosinophils Relative: 2 %
HCT: 30.7 % — ABNORMAL LOW (ref 36.0–46.0)
Hemoglobin: 9.5 g/dL — ABNORMAL LOW (ref 12.0–15.0)
Immature Granulocytes: 0 %
Lymphocytes Relative: 29 %
Lymphs Abs: 1.3 10*3/uL (ref 0.7–4.0)
MCH: 23.8 pg — ABNORMAL LOW (ref 26.0–34.0)
MCHC: 30.9 g/dL (ref 30.0–36.0)
MCV: 76.9 fL — ABNORMAL LOW (ref 80.0–100.0)
Monocytes Absolute: 1 10*3/uL (ref 0.1–1.0)
Monocytes Relative: 22 %
Neutro Abs: 2.1 10*3/uL (ref 1.7–7.7)
Neutrophils Relative %: 47 %
Platelet Count: 358 10*3/uL (ref 150–400)
RBC: 3.99 MIL/uL (ref 3.87–5.11)
RDW: 19 % — ABNORMAL HIGH (ref 11.5–15.5)
WBC Count: 4.5 10*3/uL (ref 4.0–10.5)
nRBC: 0 % (ref 0.0–0.2)

## 2018-12-07 LAB — TSH: TSH: 1.29 u[IU]/mL (ref 0.308–3.960)

## 2018-12-07 MED ORDER — SODIUM CHLORIDE 0.9% FLUSH
10.0000 mL | INTRAVENOUS | Status: DC | PRN
  Administered 2018-12-07: 10 mL
  Filled 2018-12-07: qty 10

## 2018-12-07 MED ORDER — SODIUM CHLORIDE 0.9 % IV SOLN
Freq: Once | INTRAVENOUS | Status: AC
  Administered 2018-12-07: 11:00:00 via INTRAVENOUS
  Filled 2018-12-07: qty 5

## 2018-12-07 MED ORDER — SODIUM CHLORIDE 0.9 % IV SOLN
200.0000 mg | Freq: Once | INTRAVENOUS | Status: AC
  Administered 2018-12-07: 200 mg via INTRAVENOUS
  Filled 2018-12-07: qty 8

## 2018-12-07 MED ORDER — SODIUM CHLORIDE 0.9 % IV SOLN
467.0000 mg | Freq: Once | INTRAVENOUS | Status: AC
  Administered 2018-12-07: 470 mg via INTRAVENOUS
  Filled 2018-12-07: qty 47

## 2018-12-07 MED ORDER — PALONOSETRON HCL INJECTION 0.25 MG/5ML
0.2500 mg | Freq: Once | INTRAVENOUS | Status: AC
  Administered 2018-12-07: 11:00:00 0.25 mg via INTRAVENOUS

## 2018-12-07 MED ORDER — SODIUM CHLORIDE 0.9 % IV SOLN
Freq: Once | INTRAVENOUS | Status: AC
  Administered 2018-12-07: 11:00:00 via INTRAVENOUS
  Filled 2018-12-07: qty 250

## 2018-12-07 MED ORDER — PALONOSETRON HCL INJECTION 0.25 MG/5ML
INTRAVENOUS | Status: AC
  Filled 2018-12-07: qty 5

## 2018-12-07 MED ORDER — SODIUM CHLORIDE 0.9 % IV SOLN
485.0000 mg/m2 | Freq: Once | INTRAVENOUS | Status: AC
  Administered 2018-12-07: 13:00:00 900 mg via INTRAVENOUS
  Filled 2018-12-07: qty 20

## 2018-12-07 MED ORDER — HEPARIN SOD (PORK) LOCK FLUSH 100 UNIT/ML IV SOLN
500.0000 [IU] | Freq: Once | INTRAVENOUS | Status: AC | PRN
  Administered 2018-12-07: 500 [IU]
  Filled 2018-12-07: qty 5

## 2018-12-07 NOTE — Progress Notes (Signed)
[place] OFFICE PROGRESS NOTE  [name], MD [address] [address]  DIAGNOSIS: Stage IV(T1c, N1, M1 C)non-small cell lung cancer, adenocarcinoma. Shepresented with left upper lobe lung nodule in addition to right upper lobe lung nodule with left hilar lymphadenopathy as well as multiple brain metastasis.She was diagnosed in July of 2020  Biomarker Findings Microsatellite status - MS-Stable Tumor Mutational Burden - 4 Muts/Mb Genomic Findings For a complete list of the genes assayed, please refer to the Appendix. ATM G204* KRAS G12C PDGFRA P122T SMO R199W 7 Disease relevant genes with no reportable alterations: ALK, BRAF, EGFR, ERBB2, MET, RET, ROS1  PDL 1 expression was 1%  PRIOR THERAPY: SBRT to multiple brain metastasis under the care of Dr. Isidore Moos. Completed in August 2020.   CURRENT THERAPY: Systemic chemotherapy with Carboplatin for an AUC of 5, Alimta 500 mg/m2, and Keytruda 200 mg IV every 3 weeks. Status post 3 cycle of treatment. First dose on 10/05/2018.  INTERVAL HISTORY: Carolyn [name] 63 y.o. female returns to the clinic for a follow up visit. The patient is feeling well today without any concerning complaints. The patient continues to tolerate treatment with chemotherapy well without any adverse effects. Denies any fever, chills, night sweats, or weight loss. Denies any chest pain, shortness of breath, cough, or hemoptysis. Denies any nausea, vomiting, diarrhea, or constipation. Denies any rashes or skin changes. Denies any visual changes. She occassionally has a headache. The patient states that she is scheduled for a repeat brain MRI in November and has a follow up with her neurosurgeon Dr. Saintclair Halsted around that time. She recently had a restaging CT scan. The patient is here today for evaluation and to review her scan results prior to starting cycle #4  MEDICAL HISTORY: Past Medical History:  Diagnosis Date  . Anginal  pain ([place])    " with exertion "  . Arthritis    " 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 "  . Asthma    h/o  . Coronary artery disease   . Diabetes mellitus without complication ([place])    Type II  . GERD (gastroesophageal reflux disease)   . H/O hiatal hernia   . Heart murmur   . History of radiation therapy 09/22/2018   stereotactic radiosurgery   . 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 Hypertension   . nscl ca dx'd 08/07/18  . Persistent headaches    h/o  . Pneumonia    hx of PNA  . Shortness of breath   . Vaginal dryness    h/o    ALLERGIES:  is allergic to lisinopril.  MEDICATIONS:  Current Outpatient Medications  Medication Sig Dispense Refill  . acetaminophen (TYLENOL) 500 MG tablet Take 1,000 mg by mouth every 8 (eight) hours as needed for mild pain, fever or headache.     Marland Kitchen amLODipine (NORVASC) 5 MG tablet Take 5 mg by mouth at bedtime.     Marland Kitchen aspirin EC 81 MG tablet Take 81 mg by mouth daily.    . carvedilol (COREG) 25 MG tablet Take 12.5 mg by mouth 2 (two) times daily with a meal.     . cetirizine (ZYRTEC) 10 MG tablet Take 10 mg by mouth daily.    . Cholecalciferol (VITAMIN D-3) 125 MCG (5000 UT) TABS Take 5,000 Units by mouth daily.    . Chromium Picolinate 1000 MCG TABS Take 1,000 mg by mouth daily.    Marland Kitchen co-enzyme Q-10 30 MG capsule Take 30  mg by mouth daily.     Marland Kitchen esomeprazole (NEXIUM) 20 MG capsule Take 20 mg by mouth daily at 12 noon.    . ferrous sulfate 325 (65 FE) MG tablet Take 325 mg by mouth daily with breakfast.    . folic acid (FOLVITE) 1 MG tablet Take 1 tablet (1 mg total) by mouth daily. 30 tablet 4  . Ginger, Zingiber officinalis, (GINGER ROOT) 500 MG CAPS Take 500 mg by mouth daily.    . hydrochlorothiazide (MICROZIDE) 12.5 MG capsule Take 12.5 mg by mouth daily.    Javier Docker Oil 1000 MG CAPS Take 1,000 mg by mouth daily.    Marland Kitchen lidocaine-prilocaine (EMLA) cream Apply to the Port-A-Cath site 30 to 60 minutes before chemotherapy. 30 g 0  . metFORMIN (GLUCOPHAGE) 500 MG tablet Take 500 mg  by mouth 2 (two) times daily with a meal.     . methocarbamol (ROBAXIN) 500 MG tablet Take 1 tablet (500 mg total) by mouth every 6 (six) hours as needed for muscle spasms. 21 tablet 0  . nitroGLYCERIN (NITROSTAT) 0.4 MG SL tablet Place 1 tablet (0.4 mg total) under the tongue every 5 (five) minutes as needed for chest pain. 30 tablet 12  . ONETOUCH ULTRA test strip USE 1 STRIP TO CHECK GLUCOSE THREE TIMES DAILY    . Pitavastatin Calcium (LIVALO) 4 MG TABS Take 4 mg by mouth at bedtime.     Vladimir Faster Glycol-Propyl Glycol (SYSTANE) 0.4-0.3 % SOLN Place 1 drop into both eyes every 6 (six) hours as needed (dry eyes).     . prochlorperazine (COMPAZINE) 10 MG tablet Take 1 tablet (10 mg total) by mouth every 6 (six) hours as needed for nausea or vomiting. 30 tablet 0  . psyllium (METAMUCIL) 58.6 % packet Take 1 packet by mouth at bedtime as needed (constipation).     Marland Kitchen telmisartan (MICARDIS) 40 MG tablet Take 40 mg by mouth daily.    . Turmeric 500 MG CAPS Take 500 mg by mouth daily.    Marland Kitchen zolpidem (AMBIEN) 10 MG tablet Take 2.5 mg by mouth at bedtime.      No current facility-administered medications for this visit.    Facility-Administered Medications Ordered in Other Visits  Medication Dose Route Frequency Provider Last Rate Last Dose  . CARBOplatin (PARAPLATIN) 470 mg in sodium chloride 0.9 % 250 mL chemo infusion  470 mg Intravenous Once Curt Bears, MD      . heparin lock flush 100 unit/mL  500 Units Intracatheter Once PRN Curt Bears, MD      . pembrolizumab [facility]) 200 mg in sodium chloride 0.9 % 50 mL chemo infusion  200 mg Intravenous Once Curt Bears, MD      . PEMEtrexed (ALIMTA) 900 mg in sodium chloride 0.9 % 100 mL chemo infusion  485 mg/m2 (Treatment Plan Recorded) Intravenous Once Curt Bears, MD      . sodium chloride flush (NS) 0.9 % injection 10 mL  10 mL Intracatheter PRN Curt Bears, MD        SURGICAL HISTORY:  Past Surgical History:  Procedure  Laterality Date  . ABDOMINAL HYSTERECTOMY    . bone spur    . CARDIAC CATHETERIZATION  06/13/2012  . carpel tunnel surgery Left   . COLONOSCOPY     9 years ago  . CORONARY ANGIOPLASTY  06/13/2012  . EYE SURGERY     cyst left eye   . IR IMAGING GUIDED PORT INSERTION  10/10/2018  . LEFT HEART  CATHETERIZATION WITH CORONARY ANGIOGRAM N/A 06/13/2012   Procedure: LEFT HEART CATHETERIZATION WITH CORONARY ANGIOGRAM;  Surgeon: Laverda Page, MD;  Location: [facility] CATH LAB;  Service: Cardiovascular;  Laterality: N/A;  . NECK SURGERY    . rotator cuff surgery Right 2015  . VIDEO BRONCHOSCOPY WITH ENDOBRONCHIAL NAVIGATION N/A 09/06/2018   Procedure: VIDEO BRONCHOSCOPY WITH ENDOBRONCHIAL NAVIGATION, left upper lung;  Surgeon: Lajuana Matte, MD;  Location: [place];  Service: Thoracic;  Laterality: N/A;  . VIDEO BRONCHOSCOPY WITH ENDOBRONCHIAL ULTRASOUND Left 09/06/2018   Procedure: VIDEO BRONCHOSCOPY WITH ENDOBRONCHIAL ULTRASOUND, left lung;  Surgeon: Lajuana Matte, MD;  Location: [place];  Service: Thoracic;  Laterality: Left;  . WISDOM TOOTH EXTRACTION      REVIEW OF SYSTEMS:   Review of Systems  Constitutional: Negative for appetite change, chills, fatigue, fever and unexpected weight change.  HENT: Negative for mouth sores, nosebleeds, sore throat and trouble swallowing.   Eyes: Negative for eye problems and icterus.  Respiratory: Negative for cough, hemoptysis, shortness of breath and wheezing.   Cardiovascular: Negative for chest pain and leg swelling.  Gastrointestinal: Negative for abdominal pain, constipation, diarrhea, nausea and vomiting.  Genitourinary: Negative for bladder incontinence, difficulty urinating, dysuria, frequency and hematuria.   Musculoskeletal: Negative for back pain, gait problem, neck pain and neck stiffness.  Skin: Negative for itching and rash.  Neurological: Positive for occasional headaches. Negative for dizziness, extremity weakness, gait problem,  light-headedness and seizures.  Hematological: Negative for adenopathy. Does not bruise/bleed easily.  Psychiatric/Behavioral: Negative for confusion, depression and sleep disturbance. The patient is not nervous/anxious.     PHYSICAL EXAMINATION:  Blood pressure (!) 149/72, pulse 69, temperature 98.3 F (36.8 C), temperature source Temporal, resp. rate 18, height 5' 0.5" (1.537 m), weight 182 lb (82.6 kg), SpO2 100 %.  ECOG PERFORMANCE STATUS: 1 - Symptomatic but completely ambulatory  Physical Exam  Constitutional: Oriented to person, place, and time and well-developed, well-nourished, and in no distress.  HENT:  Head: Normocephalic and atraumatic.  Mouth/Throat: Oropharynx is clear and moist. No oropharyngeal exudate.  Eyes: Conjunctivae are normal. Right eye exhibits no discharge. Left eye exhibits no discharge. No scleral icterus.  Neck: Normal range of motion. Neck supple.  Cardiovascular: Normal rate, regular rhythm, normal heart sounds and intact distal pulses.   Pulmonary/Chest: Effort normal and breath sounds normal. No respiratory distress. No wheezes. No rales.  Abdominal: Soft. Bowel sounds are normal. Exhibits no distension and no mass. There is no tenderness.  Musculoskeletal: Normal range of motion. Exhibits no edema.  Lymphadenopathy:    No cervical adenopathy.  Neurological: Alert and oriented to person, place, and time. Exhibits normal muscle tone. Gait normal. Coordination normal.  Skin: Skin is warm and dry. No rash noted. Not diaphoretic. No erythema. No pallor.  Psychiatric: Mood, memory and judgment normal.  Vitals reviewed.  LABORATORY DATA: Lab Results  Component Value Date   WBC 4.5 12/07/2018   HGB 9.5 (L) 12/07/2018   HCT 30.7 (L) 12/07/2018   MCV 76.9 (L) 12/07/2018   PLT 358 12/07/2018      Chemistry      Component Value Date/Time   NA 140 12/07/2018 0859   K 3.3 (L) 12/07/2018 0859   CL 102 12/07/2018 0859   CO2 26 12/07/2018 0859   BUN 10  12/07/2018 0859   CREATININE 1.05 (H) 12/07/2018 0859      Component Value Date/Time   CALCIUM 9.0 12/07/2018 0859   ALKPHOS 75 12/07/2018 0859  AST 21 12/07/2018 0859   ALT 15 12/07/2018 0859   BILITOT <0.2 (L) 12/07/2018 0859       RADIOGRAPHIC STUDIES:  Ct Chest W Contrast  Result Date: 12/05/2018 CLINICAL DATA:  Restaging left upper lobe lung cancer EXAM: CT CHEST, ABDOMEN, AND PELVIS WITH CONTRAST TECHNIQUE: Multidetector CT imaging of the chest, abdomen and pelvis was performed following the standard protocol during bolus administration of intravenous contrast. CONTRAST:  153m OMNIPAQUE IOHEXOL 300 MG/ML SOLN, additional oral enteric contrast COMPARISON:  PET-CT, 08/28/2018 FINDINGS: CT CHEST FINDINGS Cardiovascular: Aortic atherosclerosis. Right chest port catheter. Normal heart size. Three-vessel coronary artery calcifications. No pericardial effusion. Mediastinum/Nodes: Interval resolution of previously seen left hilar and AP window lymphadenopathy, the largest discrete AP window nodes measuring approximately 5 mm. Thyroid gland, trachea, and esophagus demonstrate no significant findings. Lungs/Pleura: Significant interval decrease in size of a spiculated nodule of the left upper lobe, measuring 1.1 x 1.1 cm, previously 2.2 x 2.0 cm when measured similarly (series 6, image 47). Interval decrease in size of a peripheral right upper lobe pulmonary nodule, which is now irregular, measuring 6 mm, previously 9 mm (series 6, image 39). No pleural effusion or pneumothorax. Musculoskeletal: No chest wall mass or suspicious bone lesions identified. CT ABDOMEN PELVIS FINDINGS Hepatobiliary: No solid liver abnormality is seen. No gallstones, gallbladder wall thickening, or biliary dilatation. Pancreas: Unremarkable. No pancreatic ductal dilatation or surrounding inflammatory changes. Spleen: Normal in size without significant abnormality. Adrenals/Urinary Tract: Adrenal glands are unremarkable.  Kidneys are normal, without renal calculi, solid lesion, or hydronephrosis. Bladder is unremarkable. Stomach/Bowel: Stomach is within normal limits. Appendix appears normal. No evidence of bowel wall thickening, distention, or inflammatory changes. Vascular/Lymphatic: Aortic atherosclerosis. No enlarged abdominal or pelvic lymph nodes. Reproductive: Status post hysterectomy. Other: No abdominal wall hernia or abnormality. No abdominopelvic ascites. Musculoskeletal: No acute or significant osseous findings. Degenerative anterolisthesis of L4 on L5. IMPRESSION: 1. Significant interval decrease in size of a spiculated nodule of the left upper lobe, measuring 1.1 x 1.1 cm, previously 2.2 x 2.0 cm when measured similarly (series 6, image 47). Interval decrease in size of a peripheral right upper lobe pulmonary nodule, which is now irregular, measuring 6 mm, previously 9 mm (series 6, image 39). Findings are consistent with treatment response. 2. Interval resolution of previously seen left hilar and AP window lymphadenopathy, the largest discrete AP window nodes measuring approximately 5 mm. Findings are again consistent with treatment response. 3. No evidence of new metastatic disease in the chest, abdomen, or pelvis. 4.  Coronary artery disease.  Aortic Atherosclerosis (ICD10-I70.0). 5.  Other chronic and incidental findings as detailed above. Electronically Signed   By: AEddie CandleM.D.   On: 12/05/2018 10:50   Ct Abdomen Pelvis W Contrast  Result Date: 12/05/2018 CLINICAL DATA:  Restaging left upper lobe lung cancer EXAM: CT CHEST, ABDOMEN, AND PELVIS WITH CONTRAST TECHNIQUE: Multidetector CT imaging of the chest, abdomen and pelvis was performed following the standard protocol during bolus administration of intravenous contrast. CONTRAST:  1024mOMNIPAQUE IOHEXOL 300 MG/ML SOLN, additional oral enteric contrast COMPARISON:  PET-CT, 08/28/2018 FINDINGS: CT CHEST FINDINGS Cardiovascular: Aortic atherosclerosis.  Right chest port catheter. Normal heart size. Three-vessel coronary artery calcifications. No pericardial effusion. Mediastinum/Nodes: Interval resolution of previously seen left hilar and AP window lymphadenopathy, the largest discrete AP window nodes measuring approximately 5 mm. Thyroid gland, trachea, and esophagus demonstrate no significant findings. Lungs/Pleura: Significant interval decrease in size of a spiculated nodule of the left upper lobe,  measuring 1.1 x 1.1 cm, previously 2.2 x 2.0 cm when measured similarly (series 6, image 47). Interval decrease in size of a peripheral right upper lobe pulmonary nodule, which is now irregular, measuring 6 mm, previously 9 mm (series 6, image 39). No pleural effusion or pneumothorax. Musculoskeletal: No chest wall mass or suspicious bone lesions identified. CT ABDOMEN PELVIS FINDINGS Hepatobiliary: No solid liver abnormality is seen. No gallstones, gallbladder wall thickening, or biliary dilatation. Pancreas: Unremarkable. No pancreatic ductal dilatation or surrounding inflammatory changes. Spleen: Normal in size without significant abnormality. Adrenals/Urinary Tract: Adrenal glands are unremarkable. Kidneys are normal, without renal calculi, solid lesion, or hydronephrosis. Bladder is unremarkable. Stomach/Bowel: Stomach is within normal limits. Appendix appears normal. No evidence of bowel wall thickening, distention, or inflammatory changes. Vascular/Lymphatic: Aortic atherosclerosis. No enlarged abdominal or pelvic lymph nodes. Reproductive: Status post hysterectomy. Other: No abdominal wall hernia or abnormality. No abdominopelvic ascites. Musculoskeletal: No acute or significant osseous findings. Degenerative anterolisthesis of L4 on L5. IMPRESSION: 1. Significant interval decrease in size of a spiculated nodule of the left upper lobe, measuring 1.1 x 1.1 cm, previously 2.2 x 2.0 cm when measured similarly (series 6, image 47). Interval decrease in size of a  peripheral right upper lobe pulmonary nodule, which is now irregular, measuring 6 mm, previously 9 mm (series 6, image 39). Findings are consistent with treatment response. 2. Interval resolution of previously seen left hilar and AP window lymphadenopathy, the largest discrete AP window nodes measuring approximately 5 mm. Findings are again consistent with treatment response. 3. No evidence of new metastatic disease in the chest, abdomen, or pelvis. 4.  Coronary artery disease.  Aortic Atherosclerosis (ICD10-I70.0). 5.  Other chronic and incidental findings as detailed above. Electronically Signed   By: Eddie Candle M.D.   On: 12/05/2018 10:50     ASSESSMENT/PLAN:  This is a very pleasant 63 year old African-American female with stage IV (T1c, N1,M1c)lung cancer. She presented with a dominant left upper lobe lung nodule in addition to a right upper lobe pulmonary nodule with left hilar lymphadenopathy. She also has metastatic disease to the brain.She has no actionable mutations and her PDL1 expression is 1%. She was diagnosed in July 2020.  The patient completed SBRT to the metastatic brain lesion under the care of Dr. Isidore Moos in August 2020.   The patient is currently undergoing systemic chemotherapy withcarboplatin for an AUC of 5, Alimta 500 mg/m, and Keytruda 200 mg IV every 3 weeks. She is status post 3 cycles of treatment. She tolerated her treatment well without any adverse side effects.   The patient had a restaging CT scan. Dr. Julien Nordmann personally and independently reviewed the scan and discussed the results with the patient today. The scan showed interval improvement in her disease. Dr. Julien Nordmann recommends that she proceed with cycle #4 today scheduled.    We will see her back for a follow up visit in 3 weeks for evaluation before starting cycle #5.   The patient will continue with her oral iron supplements.   Since the patient has advanced stage lung cancer, we have provided the  patient with a note for her employer.   She will have her repeat brain MRI on 12/22/2018 as scheduled.   Her potassium was a little low today. She was encouraged to increase her dietary intake of potassium rich foods.   The patient was advised to call immediately if she has any concerning symptoms in the interval. The patient voices understanding of current disease status and  treatment options and is in agreement with the current care plan. All questions were answered. The patient knows to call the clinic with any problems, questions or concerns. We can certainly see the patient much sooner if necessary   No orders of the defined types were placed in this encounter.    Shaheen Star L Marialuiza Car, PA-C 12/07/18  ADDENDUM: Hematology/Oncology Attending: I had a face-to-face encounter with the patient.  I recommended her care plan.  This is a very pleasant 63 years old African-American female with metastatic non-small cell lung cancer, adenocarcinoma.  She is currently undergoing systemic chemotherapy with carboplatin, Alimta and Keytruda status post 3 cycles.  The patient has been tolerating this treatment well with no concerning adverse effects. She had repeat CT scan of the chest, abdomen pelvis performed recently.  I personally and independently reviewed the scans and discussed the results with the patient today. Her scan showed evidence for improvement of her disease and the lung mass as well as the mediastinal lymphadenopathy. I recommended for the patient to continue her current treatment and she will proceed with cycle #4 of her treatment today. She will come back for follow-up visit in 3 weeks for evaluation before the next cycle of her treatment. The patient was advised to call immediately if she has any concerning symptoms in the interval.  Disclaimer: This note was dictated with voice recognition software. Similar sounding words can inadvertently be transcribed and may be missed  upon review. Eilleen Kempf, MD 12/07/18

## 2018-12-07 NOTE — Patient Instructions (Signed)
[facility] Discharge Instructions for Patients Receiving Chemotherapy  Today you received the following chemotherapy agents Keytruda, Alimta, and Carboplatin  To help prevent nausea and vomiting after your treatment, we encourage you to take your nausea medication as directed.  If you develop nausea and vomiting that is not controlled by your nausea medication, call the clinic.   BELOW ARE SYMPTOMS THAT SHOULD BE REPORTED IMMEDIATELY:  *FEVER GREATER THAN 100.5 F  *CHILLS WITH OR WITHOUT FEVER  NAUSEA AND VOMITING THAT IS NOT CONTROLLED WITH YOUR NAUSEA MEDICATION  *UNUSUAL SHORTNESS OF BREATH  *UNUSUAL BRUISING OR BLEEDING  TENDERNESS IN MOUTH AND THROAT WITH OR WITHOUT PRESENCE OF ULCERS  *URINARY PROBLEMS  *BOWEL PROBLEMS  UNUSUAL RASH Items with * indicate a potential emergency and should be followed up as soon as possible.  Feel free to call the clinic should you have any questions or concerns. The clinic phone number is [phone].  Please show the CHEMO ALERT CARD at check-in to the Emergency Department and triage nurse.   

## 2018-12-07 NOTE — Patient Instructions (Signed)

## 2018-12-14 ENCOUNTER — Other Ambulatory Visit: Payer: Self-pay

## 2018-12-14 ENCOUNTER — Inpatient Hospital Stay: Payer: Medicare Other | Attending: Physician Assistant

## 2018-12-14 DIAGNOSIS — Z5112 Encounter for antineoplastic immunotherapy: Secondary | ICD-10-CM | POA: Diagnosis not present

## 2018-12-14 DIAGNOSIS — Z79899 Other long term (current) drug therapy: Secondary | ICD-10-CM | POA: Insufficient documentation

## 2018-12-14 DIAGNOSIS — C3490 Malignant neoplasm of unspecified part of unspecified bronchus or lung: Secondary | ICD-10-CM

## 2018-12-14 DIAGNOSIS — C3412 Malignant neoplasm of upper lobe, left bronchus or lung: Secondary | ICD-10-CM | POA: Diagnosis not present

## 2018-12-14 DIAGNOSIS — Z5111 Encounter for antineoplastic chemotherapy: Secondary | ICD-10-CM | POA: Insufficient documentation

## 2018-12-14 DIAGNOSIS — C7931 Secondary malignant neoplasm of brain: Secondary | ICD-10-CM | POA: Diagnosis not present

## 2018-12-14 LAB — CMP (CANCER CENTER ONLY)
ALT: 14 U/L (ref 0–44)
AST: 21 U/L (ref 15–41)
Albumin: 3.7 g/dL (ref 3.5–5.0)
Alkaline Phosphatase: 71 U/L (ref 38–126)
Anion gap: 12 (ref 5–15)
BUN: 15 mg/dL (ref 8–23)
CO2: 27 mmol/L (ref 22–32)
Calcium: 9.1 mg/dL (ref 8.9–10.3)
Chloride: 101 mmol/L (ref 98–111)
Creatinine: 0.98 mg/dL (ref 0.44–1.00)
GFR, Est AFR Am: >60 mL/min (ref >60)
GFR, Estimated: >60 mL/min (ref >60)
Glucose, Bld: 119 mg/dL — ABNORMAL HIGH (ref 70–99)
Potassium: 3.4 mmol/L — ABNORMAL LOW (ref 3.5–5.1)
Sodium: 140 mmol/L (ref 135–145)
Total Bilirubin: 0.5 mg/dL (ref 0.3–1.2)
Total Protein: 7.4 g/dL (ref 6.5–8.1)

## 2018-12-14 LAB — CBC WITH DIFFERENTIAL (CANCER CENTER ONLY)
Abs Immature Granulocytes: 0 10*3/uL (ref 0.00–0.07)
Basophils Absolute: 0 10*3/uL (ref 0.0–0.1)
Basophils Relative: 1 %
Eosinophils Absolute: 0 10*3/uL (ref 0.0–0.5)
Eosinophils Relative: 1 %
HCT: 32.6 % — ABNORMAL LOW (ref 36.0–46.0)
Hemoglobin: 10.1 g/dL — ABNORMAL LOW (ref 12.0–15.0)
Immature Granulocytes: 0 %
Lymphocytes Relative: 55 %
Lymphs Abs: 1 10*3/uL (ref 0.7–4.0)
MCH: 24.5 pg — ABNORMAL LOW (ref 26.0–34.0)
MCHC: 31 g/dL (ref 30.0–36.0)
MCV: 78.9 fL — ABNORMAL LOW (ref 80.0–100.0)
Monocytes Absolute: 0.1 10*3/uL (ref 0.1–1.0)
Monocytes Relative: 6 %
Neutro Abs: 0.7 10*3/uL — ABNORMAL LOW (ref 1.7–7.7)
Neutrophils Relative %: 37 %
Platelet Count: 266 10*3/uL (ref 150–400)
RBC: 4.13 MIL/uL (ref 3.87–5.11)
RDW: 18.7 % — ABNORMAL HIGH (ref 11.5–15.5)
WBC Count: 1.9 10*3/uL — ABNORMAL LOW (ref 4.0–10.5)
nRBC: 0 % (ref 0.0–0.2)

## 2018-12-20 DIAGNOSIS — C3490 Malignant neoplasm of unspecified part of unspecified bronchus or lung: Secondary | ICD-10-CM | POA: Diagnosis not present

## 2018-12-20 DIAGNOSIS — M2550 Pain in unspecified joint: Secondary | ICD-10-CM | POA: Diagnosis not present

## 2018-12-20 DIAGNOSIS — Z13820 Encounter for screening for osteoporosis: Secondary | ICD-10-CM | POA: Diagnosis not present

## 2018-12-20 DIAGNOSIS — M549 Dorsalgia, unspecified: Secondary | ICD-10-CM | POA: Diagnosis not present

## 2018-12-20 DIAGNOSIS — R768 Other specified abnormal immunological findings in serum: Secondary | ICD-10-CM | POA: Diagnosis not present

## 2018-12-20 DIAGNOSIS — M1990 Unspecified osteoarthritis, unspecified site: Secondary | ICD-10-CM | POA: Diagnosis not present

## 2018-12-21 ENCOUNTER — Other Ambulatory Visit: Payer: Self-pay

## 2018-12-21 ENCOUNTER — Inpatient Hospital Stay: Payer: Medicare Other

## 2018-12-21 DIAGNOSIS — C3490 Malignant neoplasm of unspecified part of unspecified bronchus or lung: Secondary | ICD-10-CM

## 2018-12-21 DIAGNOSIS — C3412 Malignant neoplasm of upper lobe, left bronchus or lung: Secondary | ICD-10-CM | POA: Diagnosis not present

## 2018-12-21 DIAGNOSIS — Z5111 Encounter for antineoplastic chemotherapy: Secondary | ICD-10-CM | POA: Diagnosis not present

## 2018-12-21 DIAGNOSIS — C7931 Secondary malignant neoplasm of brain: Secondary | ICD-10-CM | POA: Diagnosis not present

## 2018-12-21 DIAGNOSIS — Z79899 Other long term (current) drug therapy: Secondary | ICD-10-CM | POA: Diagnosis not present

## 2018-12-21 DIAGNOSIS — Z5112 Encounter for antineoplastic immunotherapy: Secondary | ICD-10-CM | POA: Diagnosis not present

## 2018-12-21 LAB — CMP (CANCER CENTER ONLY)
ALT: 16 U/L (ref 0–44)
AST: 22 U/L (ref 15–41)
Albumin: 3.8 g/dL (ref 3.5–5.0)
Alkaline Phosphatase: 76 U/L (ref 38–126)
Anion gap: 12 (ref 5–15)
BUN: 10 mg/dL (ref 8–23)
CO2: 28 mmol/L (ref 22–32)
Calcium: 9.3 mg/dL (ref 8.9–10.3)
Chloride: 101 mmol/L (ref 98–111)
Creatinine: 0.98 mg/dL (ref 0.44–1.00)
GFR, Est AFR Am: >60 mL/min (ref >60)
GFR, Estimated: >60 mL/min (ref >60)
Glucose, Bld: 139 mg/dL — ABNORMAL HIGH (ref 70–99)
Potassium: 3.4 mmol/L — ABNORMAL LOW (ref 3.5–5.1)
Sodium: 141 mmol/L (ref 135–145)
Total Bilirubin: 0.2 mg/dL — ABNORMAL LOW (ref 0.3–1.2)
Total Protein: 7.2 g/dL (ref 6.5–8.1)

## 2018-12-21 LAB — CBC WITH DIFFERENTIAL (CANCER CENTER ONLY)
Abs Immature Granulocytes: 0 10*3/uL (ref 0.00–0.07)
Basophils Absolute: 0 10*3/uL (ref 0.0–0.1)
Basophils Relative: 0 %
Eosinophils Absolute: 0 10*3/uL (ref 0.0–0.5)
Eosinophils Relative: 1 %
HCT: 30.4 % — ABNORMAL LOW (ref 36.0–46.0)
Hemoglobin: 9.4 g/dL — ABNORMAL LOW (ref 12.0–15.0)
Immature Granulocytes: 0 %
Lymphocytes Relative: 43 %
Lymphs Abs: 1.1 10*3/uL (ref 0.7–4.0)
MCH: 24.8 pg — ABNORMAL LOW (ref 26.0–34.0)
MCHC: 30.9 g/dL (ref 30.0–36.0)
MCV: 80.2 fL (ref 80.0–100.0)
Monocytes Absolute: 0.5 10*3/uL (ref 0.1–1.0)
Monocytes Relative: 20 %
Neutro Abs: 0.9 10*3/uL — ABNORMAL LOW (ref 1.7–7.7)
Neutrophils Relative %: 36 %
Platelet Count: 119 10*3/uL — ABNORMAL LOW (ref 150–400)
RBC: 3.79 MIL/uL — ABNORMAL LOW (ref 3.87–5.11)
RDW: 19 % — ABNORMAL HIGH (ref 11.5–15.5)
WBC Count: 2.7 10*3/uL — ABNORMAL LOW (ref 4.0–10.5)
nRBC: 0 % (ref 0.0–0.2)

## 2018-12-22 ENCOUNTER — Ambulatory Visit
Admission: RE | Admit: 2018-12-22 | Discharge: 2018-12-22 | Disposition: A | Discharge status: Home / Self Care | Payer: Medicare Other | Source: Ambulatory Visit | Attending: Radiation Oncology | Admitting: Radiation Oncology

## 2018-12-22 ENCOUNTER — Other Ambulatory Visit: Payer: Self-pay | Admitting: Radiation Therapy

## 2018-12-22 ENCOUNTER — Other Ambulatory Visit: Payer: Self-pay | Admitting: Medical Oncology

## 2018-12-22 DIAGNOSIS — C7931 Secondary malignant neoplasm of brain: Secondary | ICD-10-CM | POA: Diagnosis not present

## 2018-12-22 DIAGNOSIS — Z79899 Other long term (current) drug therapy: Secondary | ICD-10-CM | POA: Diagnosis not present

## 2018-12-22 DIAGNOSIS — Z5112 Encounter for antineoplastic immunotherapy: Secondary | ICD-10-CM | POA: Diagnosis not present

## 2018-12-22 DIAGNOSIS — Z5111 Encounter for antineoplastic chemotherapy: Secondary | ICD-10-CM | POA: Diagnosis not present

## 2018-12-22 DIAGNOSIS — C7800 Secondary malignant neoplasm of unspecified lung: Secondary | ICD-10-CM | POA: Diagnosis not present

## 2018-12-22 DIAGNOSIS — C7949 Secondary malignant neoplasm of other parts of nervous system: Secondary | ICD-10-CM

## 2018-12-22 DIAGNOSIS — Z95828 Presence of other vascular implants and grafts: Secondary | ICD-10-CM

## 2018-12-22 DIAGNOSIS — C3412 Malignant neoplasm of upper lobe, left bronchus or lung: Secondary | ICD-10-CM | POA: Diagnosis not present

## 2018-12-22 MED ORDER — HEPARIN SOD (PORK) LOCK FLUSH 100 UNIT/ML IV SOLN
500.0000 [IU] | Freq: Once | INTRAVENOUS | Status: DC
  Administered 2018-12-22: 500 [IU] via INTRAVENOUS
  Filled 2018-12-22: qty 5

## 2018-12-22 MED ORDER — GADOBENATE DIMEGLUMINE 529 MG/ML IV SOLN
17.0000 mL | Freq: Once | INTRAVENOUS | Status: AC | PRN
  Administered 2018-12-22: 10:00:00 17 mL via INTRAVENOUS

## 2018-12-22 MED ORDER — SODIUM CHLORIDE 0.9% FLUSH
10.0000 mL | INTRAVENOUS | Status: DC | PRN
  Administered 2018-12-22: 10:00:00 10 mL via INTRAVENOUS
  Filled 2018-12-22: qty 10

## 2018-12-22 NOTE — Progress Notes (Addendum)
Orders placed for port access and flush

## 2018-12-26 DIAGNOSIS — C7931 Secondary malignant neoplasm of brain: Secondary | ICD-10-CM | POA: Diagnosis not present

## 2018-12-28 ENCOUNTER — Other Ambulatory Visit: Payer: Self-pay

## 2018-12-28 ENCOUNTER — Encounter: Payer: Self-pay | Admitting: *Deleted

## 2018-12-28 ENCOUNTER — Inpatient Hospital Stay: Payer: Medicare Other

## 2018-12-28 ENCOUNTER — Other Ambulatory Visit: Payer: Self-pay | Admitting: *Deleted

## 2018-12-28 ENCOUNTER — Encounter: Payer: Self-pay | Admitting: Internal Medicine

## 2018-12-28 ENCOUNTER — Inpatient Hospital Stay (HOSPITAL_BASED_OUTPATIENT_CLINIC_OR_DEPARTMENT_OTHER): Payer: Medicare Other | Admitting: Internal Medicine

## 2018-12-28 VITALS — BP 147/69 | HR 75 | Temp 98.2°F | Resp 18 | Ht 60.5 in | Wt 181.1 lb

## 2018-12-28 DIAGNOSIS — C3492 Malignant neoplasm of unspecified part of left bronchus or lung: Secondary | ICD-10-CM

## 2018-12-28 DIAGNOSIS — Z5112 Encounter for antineoplastic immunotherapy: Secondary | ICD-10-CM

## 2018-12-28 DIAGNOSIS — I25118 Atherosclerotic heart disease of native coronary artery with other forms of angina pectoris: Secondary | ICD-10-CM

## 2018-12-28 DIAGNOSIS — C3490 Malignant neoplasm of unspecified part of unspecified bronchus or lung: Secondary | ICD-10-CM

## 2018-12-28 DIAGNOSIS — C7931 Secondary malignant neoplasm of brain: Secondary | ICD-10-CM | POA: Diagnosis not present

## 2018-12-28 DIAGNOSIS — Z5111 Encounter for antineoplastic chemotherapy: Secondary | ICD-10-CM

## 2018-12-28 DIAGNOSIS — Z95828 Presence of other vascular implants and grafts: Secondary | ICD-10-CM

## 2018-12-28 DIAGNOSIS — C3412 Malignant neoplasm of upper lobe, left bronchus or lung: Secondary | ICD-10-CM | POA: Diagnosis not present

## 2018-12-28 DIAGNOSIS — Z79899 Other long term (current) drug therapy: Secondary | ICD-10-CM | POA: Diagnosis not present

## 2018-12-28 DIAGNOSIS — R5382 Chronic fatigue, unspecified: Secondary | ICD-10-CM

## 2018-12-28 LAB — CBC WITH DIFFERENTIAL (CANCER CENTER ONLY)
Abs Immature Granulocytes: 0.02 10*3/uL (ref 0.00–0.07)
Basophils Absolute: 0 10*3/uL (ref 0.0–0.1)
Basophils Relative: 0 %
Eosinophils Absolute: 0.1 10*3/uL (ref 0.0–0.5)
Eosinophils Relative: 1 %
HCT: 30.5 % — ABNORMAL LOW (ref 36.0–46.0)
Hemoglobin: 9.4 g/dL — ABNORMAL LOW (ref 12.0–15.0)
Immature Granulocytes: 1 %
Lymphocytes Relative: 33 %
Lymphs Abs: 1.4 10*3/uL (ref 0.7–4.0)
MCH: 24.7 pg — ABNORMAL LOW (ref 26.0–34.0)
MCHC: 30.8 g/dL (ref 30.0–36.0)
MCV: 80.1 fL (ref 80.0–100.0)
Monocytes Absolute: 0.8 10*3/uL (ref 0.1–1.0)
Monocytes Relative: 19 %
Neutro Abs: 1.9 10*3/uL (ref 1.7–7.7)
Neutrophils Relative %: 46 %
Platelet Count: 318 10*3/uL (ref 150–400)
RBC: 3.81 MIL/uL — ABNORMAL LOW (ref 3.87–5.11)
RDW: 20.1 % — ABNORMAL HIGH (ref 11.5–15.5)
WBC Count: 4.2 10*3/uL (ref 4.0–10.5)
nRBC: 0 % (ref 0.0–0.2)

## 2018-12-28 LAB — CMP (CANCER CENTER ONLY)
ALT: 17 U/L (ref 0–44)
AST: 23 U/L (ref 15–41)
Albumin: 3.8 g/dL (ref 3.5–5.0)
Alkaline Phosphatase: 82 U/L (ref 38–126)
Anion gap: 13 (ref 5–15)
BUN: 9 mg/dL (ref 8–23)
CO2: 27 mmol/L (ref 22–32)
Calcium: 9.2 mg/dL (ref 8.9–10.3)
Chloride: 101 mmol/L (ref 98–111)
Creatinine: 0.98 mg/dL (ref 0.44–1.00)
GFR, Est AFR Am: >60 mL/min (ref >60)
GFR, Estimated: >60 mL/min (ref >60)
Glucose, Bld: 127 mg/dL — ABNORMAL HIGH (ref 70–99)
Potassium: 3.5 mmol/L (ref 3.5–5.1)
Sodium: 141 mmol/L (ref 135–145)
Total Bilirubin: <0.2 mg/dL — ABNORMAL LOW (ref 0.3–1.2)
Total Protein: 7.4 g/dL (ref 6.5–8.1)

## 2018-12-28 LAB — TSH: TSH: 1.259 u[IU]/mL (ref 0.308–3.960)

## 2018-12-28 MED ORDER — HEPARIN SOD (PORK) LOCK FLUSH 100 UNIT/ML IV SOLN
500.0000 [IU] | Freq: Once | INTRAVENOUS | Status: AC | PRN
  Administered 2018-12-28: 500 [IU]
  Filled 2018-12-28: qty 5

## 2018-12-28 MED ORDER — PROCHLORPERAZINE MALEATE 10 MG PO TABS
10.0000 mg | ORAL_TABLET | Freq: Once | ORAL | Status: AC
  Administered 2018-12-28: 10 mg via ORAL

## 2018-12-28 MED ORDER — SODIUM CHLORIDE 0.9 % IV SOLN
Freq: Once | INTRAVENOUS | Status: AC
  Administered 2018-12-28: 11:00:00 via INTRAVENOUS
  Filled 2018-12-28: qty 250

## 2018-12-28 MED ORDER — LIDOCAINE-PRILOCAINE 2.5-2.5 % EX CREA: TOPICAL_CREAM | CUTANEOUS | 0 refills | Status: DC

## 2018-12-28 MED ORDER — PROCHLORPERAZINE MALEATE 10 MG PO TABS
ORAL_TABLET | ORAL | Status: AC
  Filled 2018-12-28: qty 1

## 2018-12-28 MED ORDER — SODIUM CHLORIDE 0.9% FLUSH
10.0000 mL | INTRAVENOUS | Status: DC | PRN
  Administered 2018-12-28: 10 mL
  Filled 2018-12-28: qty 10

## 2018-12-28 MED ORDER — SODIUM CHLORIDE 0.9 % IV SOLN
200.0000 mg | Freq: Once | INTRAVENOUS | Status: AC
  Administered 2018-12-28: 200 mg via INTRAVENOUS
  Filled 2018-12-28: qty 8

## 2018-12-28 MED ORDER — SODIUM CHLORIDE 0.9 % IV SOLN
485.0000 mg/m2 | Freq: Once | INTRAVENOUS | Status: AC
  Administered 2018-12-28: 900 mg via INTRAVENOUS
  Filled 2018-12-28: qty 20

## 2018-12-28 MED ORDER — SODIUM CHLORIDE 0.9 % IV SOLN
Freq: Once | INTRAVENOUS | Status: DC
  Filled 2018-12-28: qty 250

## 2018-12-28 NOTE — Progress Notes (Signed)
[place] [phone]   [phone]  OFFICE PROGRESS NOTE  [name], MD [address] [place] [address]  DIAGNOSIS: Stage IV (T1c, N1, M1c ) non-small cell lung cancer, adenocarcinoma diagnosed in July 2020 and presented with left upper lobe lung nodule in addition to right upper lobe lung nodule with left hilar lymphadenopathy as well as multiple brain metastasis.  Biomarker Findings Microsatellite status - MS-Stable Tumor Mutational Burden - 4 Muts/Mb Genomic Findings For a complete list of the genes assayed, please refer to the Appendix. ATM G204* KRAS G12C PDGFRA P122T SMO R199W 7 Disease relevant genes with no reportable alterations: ALK, BRAF, EGFR, ERBB2, MET, RET, ROS1  PDL 1 expression was 1%  PRIOR THERAPY: SBRT to multiple brain metastasis under the care of Dr. Isidore Moos.  CURRENT THERAPY: Systemic chemotherapy with carboplatin for AUC of 5, Alimta 500 mg/M2 and Keytruda 200 mg IV every 3 weeks.  First dose October 05, 2018.  Status post 4 cycles.  Starting from cycle #5 the patient will be on treatment with maintenance Alimta 500 mg/M2 and Keytruda 200 mg IV every 3 weeks.  INTERVAL HISTORY: Carolyn [name] 63 y.o. female returns to the clinic today for follow-up visit.  The patient is feeling fine today with no concerning complaints.  She had a recent MRI of the brain that showed resolution of the metastatic disease to the brain.  She denied having any current chest pain, shortness of breath, cough or hemoptysis.  She denied having any fever or chills.  She has no nausea, vomiting, diarrhea or constipation.  She denied having any headache or visual changes.  She is here today for evaluation before starting cycle #5 of her treatment.   MEDICAL HISTORY: Past Medical History:  Diagnosis Date  . Anginal pain ([place])    " with exertion "  . Arthritis    " 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 "  . Asthma    h/o  . Coronary artery  disease   . Diabetes mellitus without complication ([place])    Type II  . GERD (gastroesophageal reflux disease)   . H/O hiatal hernia   . Heart murmur   . History of radiation therapy 09/22/2018   stereotactic radiosurgery   . 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 Hypertension   . nscl ca dx'd 08/07/18  . Persistent headaches    h/o  . Pneumonia    hx of PNA  . Shortness of breath   . Vaginal dryness    h/o    ALLERGIES:  is allergic to lisinopril.  MEDICATIONS:  Current Outpatient Medications  Medication Sig Dispense Refill  . acetaminophen (TYLENOL) 500 MG tablet Take 1,000 mg by mouth every 8 (eight) hours as needed for mild pain, fever or headache.     Marland Kitchen amLODipine (NORVASC) 5 MG tablet Take 5 mg by mouth at bedtime.     Marland Kitchen aspirin EC 81 MG tablet Take 81 mg by mouth daily.    . carvedilol (COREG) 25 MG tablet Take 12.5 mg by mouth 2 (two) times daily with a meal.     . cetirizine (ZYRTEC) 10 MG tablet Take 10 mg by mouth daily.    . Cholecalciferol (VITAMIN D-3) 125 MCG (5000 UT) TABS Take 5,000 Units by mouth daily.    . Chromium Picolinate 1000 MCG TABS Take 1,000 mg by mouth daily.    Marland Kitchen co-enzyme Q-10 30 MG capsule Take 30 mg  by mouth daily.     Marland Kitchen esomeprazole (NEXIUM) 20 MG capsule Take 20 mg by mouth daily at 12 noon.    . ferrous sulfate 325 (65 FE) MG tablet Take 325 mg by mouth daily with breakfast.    . folic acid (FOLVITE) 1 MG tablet Take 1 tablet (1 mg total) by mouth daily. 30 tablet 4  . Ginger, Zingiber officinalis, (GINGER ROOT) 500 MG CAPS Take 500 mg by mouth daily.    . hydrochlorothiazide (MICROZIDE) 12.5 MG capsule Take 12.5 mg by mouth daily.    Javier Docker Oil 1000 MG CAPS Take 1,000 mg by mouth daily.    . metFORMIN (GLUCOPHAGE) 500 MG tablet Take 500 mg by mouth 2 (two) times daily with a meal.     . methocarbamol (ROBAXIN) 500 MG tablet Take 1 tablet (500 mg total) by mouth every 6 (six) hours as needed for muscle spasms. 21 tablet 0  . nitroGLYCERIN (NITROSTAT) 0.4  MG SL tablet Place 1 tablet (0.4 mg total) under the tongue every 5 (five) minutes as needed for chest pain. 30 tablet 12  . ONETOUCH ULTRA test strip USE 1 STRIP TO CHECK GLUCOSE THREE TIMES DAILY    . Pitavastatin Calcium (LIVALO) 4 MG TABS Take 4 mg by mouth at bedtime.     Vladimir Faster Glycol-Propyl Glycol (SYSTANE) 0.4-0.3 % SOLN Place 1 drop into both eyes every 6 (six) hours as needed (dry eyes).     . prochlorperazine (COMPAZINE) 10 MG tablet Take 1 tablet (10 mg total) by mouth every 6 (six) hours as needed for nausea or vomiting. 30 tablet 0  . psyllium (METAMUCIL) 58.6 % packet Take 1 packet by mouth at bedtime as needed (constipation).     Marland Kitchen telmisartan (MICARDIS) 40 MG tablet Take 40 mg by mouth daily.    . Turmeric 500 MG CAPS Take 500 mg by mouth daily.    Marland Kitchen zolpidem (AMBIEN) 10 MG tablet Take 2.5 mg by mouth at bedtime.     . lidocaine-prilocaine (EMLA) cream Apply to the Port-A-Cath site 30 to 60 minutes before chemotherapy. 30 g 0   No current facility-administered medications for this visit.     SURGICAL HISTORY:  Past Surgical History:  Procedure Laterality Date  . ABDOMINAL HYSTERECTOMY    . bone spur    . CARDIAC CATHETERIZATION  06/13/2012  . carpel tunnel surgery Left   . COLONOSCOPY     9 years ago  . CORONARY ANGIOPLASTY  06/13/2012  . EYE SURGERY     cyst left eye   . IR IMAGING GUIDED PORT INSERTION  10/10/2018  . LEFT HEART CATHETERIZATION WITH CORONARY ANGIOGRAM N/A 06/13/2012   Procedure: LEFT HEART CATHETERIZATION WITH CORONARY ANGIOGRAM;  Surgeon: Laverda Page, MD;  Location: [facility] CATH LAB;  Service: Cardiovascular;  Laterality: N/A;  . NECK SURGERY    . rotator cuff surgery Right 2015  . VIDEO BRONCHOSCOPY WITH ENDOBRONCHIAL NAVIGATION N/A 09/06/2018   Procedure: VIDEO BRONCHOSCOPY WITH ENDOBRONCHIAL NAVIGATION, left upper lung;  Surgeon: Lajuana Matte, MD;  Location: [facility];  Service: Thoracic;  Laterality: N/A;  . VIDEO BRONCHOSCOPY WITH  ENDOBRONCHIAL ULTRASOUND Left 09/06/2018   Procedure: VIDEO BRONCHOSCOPY WITH ENDOBRONCHIAL ULTRASOUND, left lung;  Surgeon: Lajuana Matte, MD;  Location: [facility];  Service: Thoracic;  Laterality: Left;  . WISDOM TOOTH EXTRACTION      REVIEW OF SYSTEMS:  A comprehensive review of systems was negative.   PHYSICAL EXAMINATION: General appearance: alert, cooperative and  no distress Head: Normocephalic, without obvious abnormality, atraumatic Neck: no adenopathy, no JVD, supple, symmetrical, trachea midline and thyroid not enlarged, symmetric, no tenderness/mass/nodules Lymph nodes: Cervical, supraclavicular, and axillary nodes normal. Resp: clear to auscultation bilaterally Back: symmetric, no curvature. ROM normal. No CVA tenderness. Cardio: regular rate and rhythm, S1, S2 normal, no murmur, click, rub or gallop GI: soft, non-tender; bowel sounds normal; no masses,  no organomegaly Extremities: extremities normal, atraumatic, no cyanosis or edema  ECOG PERFORMANCE STATUS: 1 - Symptomatic but completely ambulatory  Blood pressure (!) 147/69, pulse 75, temperature 98.2 F (36.8 C), temperature source Temporal, resp. rate 18, height 5' 0.5" (1.537 m), weight 181 lb 1.6 oz (82.1 kg), SpO2 99 %.  LABORATORY DATA: Lab Results  Component Value Date   WBC 4.2 12/28/2018   HGB 9.4 (L) 12/28/2018   HCT 30.5 (L) 12/28/2018   MCV 80.1 12/28/2018   PLT 318 12/28/2018      Chemistry      Component Value Date/Time   NA 141 12/21/2018 1009   K 3.4 (L) 12/21/2018 1009   CL 101 12/21/2018 1009   CO2 28 12/21/2018 1009   BUN 10 12/21/2018 1009   CREATININE 0.98 12/21/2018 1009      Component Value Date/Time   CALCIUM 9.3 12/21/2018 1009   ALKPHOS 76 12/21/2018 1009   AST 22 12/21/2018 1009   ALT 16 12/21/2018 1009   BILITOT 0.2 (L) 12/21/2018 1009       RADIOGRAPHIC STUDIES: Ct Chest W Contrast  Result Date: 12/05/2018 CLINICAL DATA:  Restaging left upper lobe lung cancer EXAM:  CT CHEST, ABDOMEN, AND PELVIS WITH CONTRAST TECHNIQUE: Multidetector CT imaging of the chest, abdomen and pelvis was performed following the standard protocol during bolus administration of intravenous contrast. CONTRAST:  129m OMNIPAQUE IOHEXOL 300 MG/ML SOLN, additional oral enteric contrast COMPARISON:  PET-CT, 08/28/2018 FINDINGS: CT CHEST FINDINGS Cardiovascular: Aortic atherosclerosis. Right chest port catheter. Normal heart size. Three-vessel coronary artery calcifications. No pericardial effusion. Mediastinum/Nodes: Interval resolution of previously seen left hilar and AP window lymphadenopathy, the largest discrete AP window nodes measuring approximately 5 mm. Thyroid gland, trachea, and esophagus demonstrate no significant findings. Lungs/Pleura: Significant interval decrease in size of a spiculated nodule of the left upper lobe, measuring 1.1 x 1.1 cm, previously 2.2 x 2.0 cm when measured similarly (series 6, image 47). Interval decrease in size of a peripheral right upper lobe pulmonary nodule, which is now irregular, measuring 6 mm, previously 9 mm (series 6, image 39). No pleural effusion or pneumothorax. Musculoskeletal: No chest wall mass or suspicious bone lesions identified. CT ABDOMEN PELVIS FINDINGS Hepatobiliary: No solid liver abnormality is seen. No gallstones, gallbladder wall thickening, or biliary dilatation. Pancreas: Unremarkable. No pancreatic ductal dilatation or surrounding inflammatory changes. Spleen: Normal in size without significant abnormality. Adrenals/Urinary Tract: Adrenal glands are unremarkable. Kidneys are normal, without renal calculi, solid lesion, or hydronephrosis. Bladder is unremarkable. Stomach/Bowel: Stomach is within normal limits. Appendix appears normal. No evidence of bowel wall thickening, distention, or inflammatory changes. Vascular/Lymphatic: Aortic atherosclerosis. No enlarged abdominal or pelvic lymph nodes. Reproductive: Status post hysterectomy. Other:  No abdominal wall hernia or abnormality. No abdominopelvic ascites. Musculoskeletal: No acute or significant osseous findings. Degenerative anterolisthesis of L4 on L5. IMPRESSION: 1. Significant interval decrease in size of a spiculated nodule of the left upper lobe, measuring 1.1 x 1.1 cm, previously 2.2 x 2.0 cm when measured similarly (series 6, image 47). Interval decrease in size of a peripheral right upper lobe  pulmonary nodule, which is now irregular, measuring 6 mm, previously 9 mm (series 6, image 39). Findings are consistent with treatment response. 2. Interval resolution of previously seen left hilar and AP window lymphadenopathy, the largest discrete AP window nodes measuring approximately 5 mm. Findings are again consistent with treatment response. 3. No evidence of new metastatic disease in the chest, abdomen, or pelvis. 4.  Coronary artery disease.  Aortic Atherosclerosis (ICD10-I70.0). 5.  Other chronic and incidental findings as detailed above. Electronically Signed   By: Eddie Candle M.D.   On: 12/05/2018 10:50   Mr Jeri Cos GY Contrast  Result Date: 12/22/2018 CLINICAL DATA:  Metastatic small cell lung cancer. Whole brain radiotherapy. Follow-up. Eight brain metastases seen previously. EXAM: MRI HEAD WITHOUT AND WITH CONTRAST TECHNIQUE: Multiplanar, multiecho pulse sequences of the brain and surrounding structures were obtained without and with intravenous contrast. CONTRAST:  42m MULTIHANCE GADOBENATE DIMEGLUMINE 529 MG/ML IV SOLN COMPARISON:  A 08/28/2018 FINDINGS: BRAIN New Lesions: None. Larger lesions: None. Stable or Smaller lesions: 8 previously seen metastatic lesions scattered throughout the cerebral hemispheres are completely visible on today's study. Previous locations are marked with single arrows, there is enhancing disease. It has cyst Other Brain findings: Few punctate nonenhancing white matter foci seen within the cerebral hemispheric white matter, unchanged. Evidence of  recent infarction, hemorrhage, hydrocephalus or extra-axial collection. Vascular: Major vessels at the base of the brain show flow. Skull and upper cervical spine: Negative Sinuses/Orbits: Clear/normal Other: Severe none IMPRESSION: Complete resolution of the 8 enhancing foci previously demonstrated in August. No new or progressive disease. Electronically Signed   By: MNelson ChimesM.D.   On: 12/22/2018 10:26   Ct Abdomen Pelvis W Contrast  Result Date: 12/05/2018 CLINICAL DATA:  Restaging left upper lobe lung cancer EXAM: CT CHEST, ABDOMEN, AND PELVIS WITH CONTRAST TECHNIQUE: Multidetector CT imaging of the chest, abdomen and pelvis was performed following the standard protocol during bolus administration of intravenous contrast. CONTRAST:  1053mOMNIPAQUE IOHEXOL 300 MG/ML SOLN, additional oral enteric contrast COMPARISON:  PET-CT, 08/28/2018 FINDINGS: CT CHEST FINDINGS Cardiovascular: Aortic atherosclerosis. Right chest port catheter. Normal heart size. Three-vessel coronary artery calcifications. No pericardial effusion. Mediastinum/Nodes: Interval resolution of previously seen left hilar and AP window lymphadenopathy, the largest discrete AP window nodes measuring approximately 5 mm. Thyroid gland, trachea, and esophagus demonstrate no significant findings. Lungs/Pleura: Significant interval decrease in size of a spiculated nodule of the left upper lobe, measuring 1.1 x 1.1 cm, previously 2.2 x 2.0 cm when measured similarly (series 6, image 47). Interval decrease in size of a peripheral right upper lobe pulmonary nodule, which is now irregular, measuring 6 mm, previously 9 mm (series 6, image 39). No pleural effusion or pneumothorax. Musculoskeletal: No chest wall mass or suspicious bone lesions identified. CT ABDOMEN PELVIS FINDINGS Hepatobiliary: No solid liver abnormality is seen. No gallstones, gallbladder wall thickening, or biliary dilatation. Pancreas: Unremarkable. No pancreatic ductal dilatation  or surrounding inflammatory changes. Spleen: Normal in size without significant abnormality. Adrenals/Urinary Tract: Adrenal glands are unremarkable. Kidneys are normal, without renal calculi, solid lesion, or hydronephrosis. Bladder is unremarkable. Stomach/Bowel: Stomach is within normal limits. Appendix appears normal. No evidence of bowel wall thickening, distention, or inflammatory changes. Vascular/Lymphatic: Aortic atherosclerosis. No enlarged abdominal or pelvic lymph nodes. Reproductive: Status post hysterectomy. Other: No abdominal wall hernia or abnormality. No abdominopelvic ascites. Musculoskeletal: No acute or significant osseous findings. Degenerative anterolisthesis of L4 on L5. IMPRESSION: 1. Significant interval decrease in size of a spiculated nodule  of the left upper lobe, measuring 1.1 x 1.1 cm, previously 2.2 x 2.0 cm when measured similarly (series 6, image 47). Interval decrease in size of a peripheral right upper lobe pulmonary nodule, which is now irregular, measuring 6 mm, previously 9 mm (series 6, image 39). Findings are consistent with treatment response. 2. Interval resolution of previously seen left hilar and AP window lymphadenopathy, the largest discrete AP window nodes measuring approximately 5 mm. Findings are again consistent with treatment response. 3. No evidence of new metastatic disease in the chest, abdomen, or pelvis. 4.  Coronary artery disease.  Aortic Atherosclerosis (ICD10-I70.0). 5.  Other chronic and incidental findings as detailed above. Electronically Signed   By: Eddie Candle M.D.   On: 12/05/2018 10:50    ASSESSMENT AND PLAN: This is a very pleasant 63 years old African-American female with likely stage IV (T1c, N1, M1C) lung cancer pending tissue diagnosis and presented with left upper lobe lung nodule in addition to right upper lobe pulmonary nodule as well as left hilar adenopathy and metastatic lesion to the brain. Molecular studies showed no actionable  mutation and PDL 1 expression was 1%. The patient underwent SBRT to the metastatic brain lesion under the care of Dr. Isidore Moos. The patient is currently undergoing systemic chemotherapy with carboplatin for AUC of 5, Alimta 500 mg/M2 and Keytruda 200 mg IV every 3 weeks is status post 4 cycles.  The patient has been tolerating her treatment well with no concerning adverse effects. Starting from cycle #5 she will be treated with maintenance treatment with Alimta and Keytruda every 3 weeks. I recommended for the patient to proceed with cycle #5 today as planned. I will see her back for follow-up visit in 3 weeks for evaluation before the next cycle of her treatment. She was advised to call immediately if she has any concerning symptoms in the interval. The patient voices understanding of current disease status and treatment options and is in agreement with the current care plan. All questions were answered. The patient knows to call the clinic with any problems, questions or concerns. We can certainly see the patient much sooner if necessary.  I spent 10 minutes counseling the patient face to face. The total time spent in the appointment was 15 minutes.  Disclaimer: This note was dictated with voice recognition software. Similar sounding words can inadvertently be transcribed and may not be corrected upon review.

## 2018-12-28 NOTE — Patient Instructions (Signed)
[facility] Discharge Instructions for Patients Receiving Chemotherapy  Today you received the following chemotherapy agents: Keytruda, Alimta, To help prevent nausea and vomiting after your treatment, we encourage you to take your nausea medication as directed.   If you develop nausea and vomiting that is not controlled by your nausea medication, call the clinic.   BELOW ARE SYMPTOMS THAT SHOULD BE REPORTED IMMEDIATELY:  *FEVER GREATER THAN 100.5 F  *CHILLS WITH OR WITHOUT FEVER  NAUSEA AND VOMITING THAT IS NOT CONTROLLED WITH YOUR NAUSEA MEDICATION  *UNUSUAL SHORTNESS OF BREATH  *UNUSUAL BRUISING OR BLEEDING  TENDERNESS IN MOUTH AND THROAT WITH OR WITHOUT PRESENCE OF ULCERS  *URINARY PROBLEMS  *BOWEL PROBLEMS  UNUSUAL RASH Items with * indicate a potential emergency and should be followed up as soon as possible.  Feel free to call the clinic should you have any questions or concerns. The clinic phone number is [phone].  Please show the [facility] at check-in to the Emergency Department and triage nurse.

## 2018-12-28 NOTE — Progress Notes (Signed)
Oncology Nurse Navigator Documentation  Oncology Nurse Navigator Flowsheets 12/28/2018  Abnormal Finding Date -  Confirmed Diagnosis Date -  Navigator Follow Up Date: -  Navigator Follow Up Reason: -  Navigator Location [facility]  Referral Date to RadOnc/MedOnc -  Navigator Encounter Type Clinic/MDC  Telephone -  [facility] Date -  Multidisiplinary Clinic Type -  Patient Visit Type MedOnc  Treatment Phase Treatment  Barriers/Navigation Needs Education/helped patient update MyChart and explained its use  Education Other  Interventions Education  Acuity Level 2-Minimal Needs (1-2 Barriers Identified)  Coordination of Care -  Education Method Verbal  Time Spent with Patient 15

## 2018-12-29 ENCOUNTER — Telehealth: Payer: Self-pay | Admitting: Internal Medicine

## 2018-12-29 NOTE — Telephone Encounter (Signed)
Scheduled per los. Called and spoke with patient. Advised of cancelled labs and confirmed appts

## 2019-01-05 ENCOUNTER — Other Ambulatory Visit: Payer: Medicare Other

## 2019-01-11 ENCOUNTER — Other Ambulatory Visit: Payer: Medicare Other

## 2019-01-17 ENCOUNTER — Other Ambulatory Visit: Payer: Self-pay | Admitting: Physician Assistant

## 2019-01-17 DIAGNOSIS — C3492 Malignant neoplasm of unspecified part of left bronchus or lung: Secondary | ICD-10-CM

## 2019-01-17 DIAGNOSIS — Z5112 Encounter for antineoplastic immunotherapy: Secondary | ICD-10-CM

## 2019-01-17 NOTE — Progress Notes (Signed)
Carolyn [name] OFFICE PROGRESS NOTE  Carolyn Gravel, MD [address] [place] [address]  DIAGNOSIS: Stage IV(T1c, N1, M1 C)non-small cell lung cancer, adenocarcinoma. Shepresented with left upper lobe lung nodule in addition to right upper lobe lung nodule with left hilar lymphadenopathy as well as multiple brain metastasis.She was diagnosed in July of 2020  Biomarker Findings Microsatellite status - MS-Stable Tumor Mutational Burden - 4 Muts/Mb Genomic Findings For a complete list of the genes assayed, please refer to the Appendix. ATM G204* KRAS G12C PDGFRA P122T SMO R199W 7 Disease relevant genes with no reportable alterations: ALK, BRAF, EGFR, ERBB2, MET, RET, ROS1  PDL 1 expressionwas 1%  PRIOR THERAPY: SBRT to multiple brain metastasis under the care of Dr. Isidore Moos. Completed in August 2020.   CURRENT THERAPY: Systemic chemotherapy with Carboplatin for an AUC of 5, Alimta 500 mg/m2, and Keytruda 200 mg IV every 3 weeks. Status post 5 cycles of treatment. First dose on 10/05/2018.  INTERVAL HISTORY: Carolyn [name] 63 y.o. female returns to clinic for follow-up visit.  The patient is feeling well today without any concerning complaints.  Patient recently met with Dr. Saintclair Halsted from neurosurgery last month. The plan is to continue to monitor her closely for her history of metastatic disease to the brain, per patient report.  Regarding her chemotherapy, the patient has been tolerating this treatment well without any concerning adverse side effects.  She denies any fever, chills, night sweats, or weight loss.  She denies any chest pain, shortness of breath, cough, or hemoptysis.  She reports her baseline constipation for which she takes metamucil. She has a few episodes of queasiness which resolves with her compazine. She had an episode of vomiting around thanksgiving but attributes it to something that she ate.  She denies any headache or visual changes.   She denies any rashes or skin changes.  She is here today for evaluation before starting cycle #6.  MEDICAL HISTORY: Past Medical History:  Diagnosis Date  . Anginal pain ([place])    " with exertion "  . Arthritis    " 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 "  . Asthma    h/o  . Coronary artery disease   . Diabetes mellitus without complication ([place])    Type II  . GERD (gastroesophageal reflux disease)   . H/O hiatal hernia   . Heart murmur   . History of radiation therapy 09/22/2018   stereotactic radiosurgery   . 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 Hypertension   . nscl ca dx'd 08/07/18  . Persistent headaches    h/o  . Pneumonia    hx of PNA  . Shortness of breath   . Vaginal dryness    h/o    ALLERGIES:  is allergic to lisinopril.  MEDICATIONS:  Current Outpatient Medications  Medication Sig Dispense Refill  . acetaminophen (TYLENOL) 500 MG tablet Take 1,000 mg by mouth every 8 (eight) hours as needed for mild pain, fever or headache.     Marland Kitchen amLODipine (NORVASC) 5 MG tablet Take 5 mg by mouth at bedtime.     Marland Kitchen aspirin EC 81 MG tablet Take 81 mg by mouth daily.    . carvedilol (COREG) 25 MG tablet Take 12.5 mg by mouth 2 (two) times daily with a meal.     . cetirizine (ZYRTEC) 10 MG tablet Take 10 mg by mouth daily.    . Cholecalciferol (VITAMIN D-3) 125 MCG (5000 UT) TABS Take 5,000  Units by mouth daily.    . Chromium Picolinate 1000 MCG TABS Take 1,000 mg by mouth daily.    Marland Kitchen co-enzyme Q-10 30 MG capsule Take 30 mg by mouth daily.     Marland Kitchen esomeprazole (NEXIUM) 20 MG capsule Take 20 mg by mouth daily at 12 noon.    . ferrous sulfate 325 (65 FE) MG tablet Take 325 mg by mouth daily with breakfast.    . folic acid (FOLVITE) 1 MG tablet Take 1 tablet (1 mg total) by mouth daily. 30 tablet 4  . Ginger, Zingiber officinalis, (GINGER ROOT) 500 MG CAPS Take 500 mg by mouth daily.    . hydrochlorothiazide (MICROZIDE) 12.5 MG capsule Take 12.5 mg by mouth daily.    Javier Docker Oil 1000 MG CAPS Take 1,000 mg by mouth  daily.    Marland Kitchen lidocaine-prilocaine (EMLA) cream Apply to the Port-A-Cath site 30 to 60 minutes before chemotherapy. 30 g 0  . metFORMIN (GLUCOPHAGE) 500 MG tablet Take 500 mg by mouth 2 (two) times daily with a meal.     . methocarbamol (ROBAXIN) 500 MG tablet Take 1 tablet (500 mg total) by mouth every 6 (six) hours as needed for muscle spasms. 21 tablet 0  . nitroGLYCERIN (NITROSTAT) 0.4 MG SL tablet Place 1 tablet (0.4 mg total) under the tongue every 5 (five) minutes as needed for chest pain. 30 tablet 12  . ONETOUCH ULTRA test strip USE 1 STRIP TO CHECK GLUCOSE THREE TIMES DAILY    . Pitavastatin Calcium (LIVALO) 4 MG TABS Take 4 mg by mouth at bedtime.     Vladimir Faster Glycol-Propyl Glycol (SYSTANE) 0.4-0.3 % SOLN Place 1 drop into both eyes every 6 (six) hours as needed (dry eyes).     . prochlorperazine (COMPAZINE) 10 MG tablet Take 1 tablet (10 mg total) by mouth every 6 (six) hours as needed for nausea or vomiting. 30 tablet 0  . psyllium (METAMUCIL) 58.6 % packet Take 1 packet by mouth at bedtime as needed (constipation).     Marland Kitchen telmisartan (MICARDIS) 40 MG tablet Take 40 mg by mouth daily.    . Turmeric 500 MG CAPS Take 500 mg by mouth daily.    Marland Kitchen zolpidem (AMBIEN) 10 MG tablet Take 2.5 mg by mouth at bedtime.      No current facility-administered medications for this visit.     SURGICAL HISTORY:  Past Surgical History:  Procedure Laterality Date  . ABDOMINAL HYSTERECTOMY    . bone spur    . CARDIAC CATHETERIZATION  06/13/2012  . carpel tunnel surgery Left   . COLONOSCOPY     9 years ago  . CORONARY ANGIOPLASTY  06/13/2012  . EYE SURGERY     cyst left eye   . IR IMAGING GUIDED PORT INSERTION  10/10/2018  . LEFT HEART CATHETERIZATION WITH CORONARY ANGIOGRAM N/A 06/13/2012   Procedure: LEFT HEART CATHETERIZATION WITH CORONARY ANGIOGRAM;  Surgeon: Laverda Page, MD;  Location: [facility] CATH LAB;  Service: Cardiovascular;  Laterality: N/A;  . NECK SURGERY    . rotator cuff surgery Right  2015  . VIDEO BRONCHOSCOPY WITH ENDOBRONCHIAL NAVIGATION N/A 09/06/2018   Procedure: VIDEO BRONCHOSCOPY WITH ENDOBRONCHIAL NAVIGATION, left upper lung;  Surgeon: Lajuana Matte, MD;  Location: [facility];  Service: Thoracic;  Laterality: N/A;  . VIDEO BRONCHOSCOPY WITH ENDOBRONCHIAL ULTRASOUND Left 09/06/2018   Procedure: VIDEO BRONCHOSCOPY WITH ENDOBRONCHIAL ULTRASOUND, left lung;  Surgeon: Lajuana Matte, MD;  Location: [facility];  Service: Thoracic;  Laterality:  Left;  . WISDOM TOOTH EXTRACTION      REVIEW OF SYSTEMS:   Review of Systems  Constitutional: Negative for appetite change, chills, fatigue, fever and unexpected weight change.  HENT: Negative for mouth sores, nosebleeds, sore throat and trouble swallowing.   Eyes: Negative for eye problems and icterus.  Respiratory: Negative for cough, hemoptysis, shortness of breath and wheezing.   Cardiovascular: Negative for chest pain and leg swelling.  Gastrointestinal: Positive for occasional nausea. Negative for abdominal pain, constipation, diarrhea, and vomiting.  Genitourinary: Negative for bladder incontinence, difficulty urinating, dysuria, frequency and hematuria.   Musculoskeletal: Negative for back pain, gait problem, neck pain and neck stiffness.  Skin: Negative for itching and rash.  Neurological: Negative for dizziness, extremity weakness, gait problem, headaches, light-headedness and seizures.  Hematological: Negative for adenopathy. Does not bruise/bleed easily.  Psychiatric/Behavioral: Negative for confusion, depression and sleep disturbance. The patient is not nervous/anxious.     PHYSICAL EXAMINATION:  There were no vitals taken for this visit.  ECOG PERFORMANCE STATUS: 1 - Symptomatic but completely ambulatory  Physical Exam  Constitutional: Oriented to person, place, and time and well-developed, well-nourished, and in no distress.  HENT:  Head: Normocephalic and atraumatic.  Mouth/Throat: Oropharynx is clear and  moist. No oropharyngeal exudate.  Eyes: Conjunctivae are normal. Right eye exhibits no discharge. Left eye exhibits no discharge. No scleral icterus.  Neck: Normal range of motion. Neck supple.  Cardiovascular: Normal rate, regular rhythm, normal heart sounds and intact distal pulses.   Pulmonary/Chest: Effort normal and breath sounds normal. No respiratory distress. No wheezes. No rales.  Abdominal: Soft. Bowel sounds are normal. Exhibits no distension and no mass. There is no tenderness.  Musculoskeletal: Normal range of motion. Exhibits no edema.  Lymphadenopathy:    No cervical adenopathy.  Neurological: Alert and oriented to person, place, and time. Exhibits normal muscle tone. Gait normal. Coordination normal.  Skin: Skin is warm and dry. No rash noted. Not diaphoretic. No erythema. No pallor.  Psychiatric: Mood, memory and judgment normal.  Vitals reviewed.  LABORATORY DATA: Lab Results  Component Value Date   WBC 4.2 12/28/2018   HGB 9.4 (L) 12/28/2018   HCT 30.5 (L) 12/28/2018   MCV 80.1 12/28/2018   PLT 318 12/28/2018      Chemistry      Component Value Date/Time   NA 141 12/28/2018 1000   K 3.5 12/28/2018 1000   CL 101 12/28/2018 1000   CO2 27 12/28/2018 1000   BUN 9 12/28/2018 1000   CREATININE 0.98 12/28/2018 1000      Component Value Date/Time   CALCIUM 9.2 12/28/2018 1000   ALKPHOS 82 12/28/2018 1000   AST 23 12/28/2018 1000   ALT 17 12/28/2018 1000   BILITOT <0.2 (L) 12/28/2018 1000       RADIOGRAPHIC STUDIES:  Mr Jeri Cos TI Contrast  Result Date: 12/22/2018 CLINICAL DATA:  Metastatic small cell lung cancer. Whole brain radiotherapy. Follow-up. Eight brain metastases seen previously. EXAM: MRI HEAD WITHOUT AND WITH CONTRAST TECHNIQUE: Multiplanar, multiecho pulse sequences of the brain and surrounding structures were obtained without and with intravenous contrast. CONTRAST:  36m MULTIHANCE GADOBENATE DIMEGLUMINE 529 MG/ML IV SOLN COMPARISON:  A  08/28/2018 FINDINGS: BRAIN New Lesions: None. Larger lesions: None. Stable or Smaller lesions: 8 previously seen metastatic lesions scattered throughout the cerebral hemispheres are completely visible on today's study. Previous locations are marked with single arrows, there is enhancing disease. It has cyst Other Brain findings: Few punctate nonenhancing white  matter foci seen within the cerebral hemispheric white matter, unchanged. Evidence of recent infarction, hemorrhage, hydrocephalus or extra-axial collection. Vascular: Major vessels at the base of the brain show flow. Skull and upper cervical spine: Negative Sinuses/Orbits: Clear/normal Other: Severe none IMPRESSION: Complete resolution of the 8 enhancing foci previously demonstrated in August. No new or progressive disease. Electronically Signed   By: Nelson Chimes M.D.   On: 12/22/2018 10:26     ASSESSMENT/PLAN:  This is a very pleasant 63 year old African-American female with stage IV (T1c, N1,M1c)lung cancer. She presented with a dominant left upper lobe lung nodule in addition to a right upper lobe pulmonary nodule with left hilar lymphadenopathy. She also has metastatic disease to the brain.She has no actionable mutations and her PDL1 expression is 1%. She was diagnosed in July 2020.  The patient completed SBRT to the metastatic brain lesion under the care of Dr. Isidore Moos in August 2020.   The patient is currently undergoing systemic chemotherapy withcarboplatin for an AUC of 5, Alimta 500 mg/m, and Keytruda 200 mg IV every 3 weeks. She is status post 5 cycles of treatment. Starting from cycle #5, she has been on maintenance Alimta 500 mg/m2 and Keytruda 200 mg IV. She tolerated her treatment well without any concerning adverse side effects.    Labs were reviewed.  Recommend that she proceed with cycle #6 today scheduled.  I will arrange for the patient to have a restaging CT scan performed prior to her next cycle of  chemotherapy.  We will see the patient back for follow-up visit in 3 weeks for evaluation and to review her scan results before starting cycle #7.  I have sent a refill of compazine to the patient's pharmacy.   The patient was advised to call immediately if she has any concerning symptoms in the interval. The patient voices understanding of current disease status and treatment options and is in agreement with the current care plan. All questions were answered. The patient knows to call the clinic with any problems, questions or concerns. We can certainly see the patient much sooner if necessary    No orders of the defined types were placed in this encounter.    Drevon Plog L Burnice Vassel, PA-C 01/17/19

## 2019-01-18 ENCOUNTER — Inpatient Hospital Stay: Payer: Medicare Other | Attending: Physician Assistant | Admitting: Physician Assistant

## 2019-01-18 ENCOUNTER — Other Ambulatory Visit: Payer: Self-pay

## 2019-01-18 ENCOUNTER — Inpatient Hospital Stay: Payer: Medicare Other

## 2019-01-18 VITALS — BP 137/66 | HR 63 | Temp 98.3°F | Resp 17 | Ht 60.0 in | Wt 179.5 lb

## 2019-01-18 DIAGNOSIS — C3492 Malignant neoplasm of unspecified part of left bronchus or lung: Secondary | ICD-10-CM

## 2019-01-18 DIAGNOSIS — C3490 Malignant neoplasm of unspecified part of unspecified bronchus or lung: Secondary | ICD-10-CM

## 2019-01-18 DIAGNOSIS — Z79899 Other long term (current) drug therapy: Secondary | ICD-10-CM | POA: Insufficient documentation

## 2019-01-18 DIAGNOSIS — C3412 Malignant neoplasm of upper lobe, left bronchus or lung: Secondary | ICD-10-CM | POA: Insufficient documentation

## 2019-01-18 DIAGNOSIS — Z5111 Encounter for antineoplastic chemotherapy: Secondary | ICD-10-CM | POA: Insufficient documentation

## 2019-01-18 DIAGNOSIS — C7931 Secondary malignant neoplasm of brain: Secondary | ICD-10-CM | POA: Insufficient documentation

## 2019-01-18 DIAGNOSIS — R5382 Chronic fatigue, unspecified: Secondary | ICD-10-CM

## 2019-01-18 DIAGNOSIS — Z5112 Encounter for antineoplastic immunotherapy: Secondary | ICD-10-CM | POA: Insufficient documentation

## 2019-01-18 LAB — CMP (CANCER CENTER ONLY)
ALT: 32 U/L (ref 0–44)
AST: 44 U/L — ABNORMAL HIGH (ref 15–41)
Albumin: 3.7 g/dL (ref 3.5–5.0)
Alkaline Phosphatase: 75 U/L (ref 38–126)
Anion gap: 11 (ref 5–15)
BUN: 10 mg/dL (ref 8–23)
CO2: 30 mmol/L (ref 22–32)
Calcium: 9.1 mg/dL (ref 8.9–10.3)
Chloride: 103 mmol/L (ref 98–111)
Creatinine: 1.16 mg/dL — ABNORMAL HIGH (ref 0.44–1.00)
GFR, Est AFR Am: 57 mL/min — ABNORMAL LOW (ref >60)
GFR, Estimated: 49 mL/min — ABNORMAL LOW (ref >60)
Glucose, Bld: 98 mg/dL (ref 70–99)
Potassium: 3.8 mmol/L (ref 3.5–5.1)
Sodium: 144 mmol/L (ref 135–145)
Total Bilirubin: 0.4 mg/dL (ref 0.3–1.2)
Total Protein: 7.4 g/dL (ref 6.5–8.1)

## 2019-01-18 LAB — CBC WITH DIFFERENTIAL (CANCER CENTER ONLY)
Abs Immature Granulocytes: 0.02 10*3/uL (ref 0.00–0.07)
Basophils Absolute: 0 10*3/uL (ref 0.0–0.1)
Basophils Relative: 0 %
Eosinophils Absolute: 0.2 10*3/uL (ref 0.0–0.5)
Eosinophils Relative: 3 %
HCT: 31.2 % — ABNORMAL LOW (ref 36.0–46.0)
Hemoglobin: 9.6 g/dL — ABNORMAL LOW (ref 12.0–15.0)
Immature Granulocytes: 0 %
Lymphocytes Relative: 20 %
Lymphs Abs: 1.3 10*3/uL (ref 0.7–4.0)
MCH: 25.1 pg — ABNORMAL LOW (ref 26.0–34.0)
MCHC: 30.8 g/dL (ref 30.0–36.0)
MCV: 81.5 fL (ref 80.0–100.0)
Monocytes Absolute: 1 10*3/uL (ref 0.1–1.0)
Monocytes Relative: 15 %
Neutro Abs: 3.8 10*3/uL (ref 1.7–7.7)
Neutrophils Relative %: 62 %
Platelet Count: 332 10*3/uL (ref 150–400)
RBC: 3.83 MIL/uL — ABNORMAL LOW (ref 3.87–5.11)
RDW: 20.5 % — ABNORMAL HIGH (ref 11.5–15.5)
WBC Count: 6.3 10*3/uL (ref 4.0–10.5)
nRBC: 0 % (ref 0.0–0.2)

## 2019-01-18 LAB — TSH: TSH: 1.627 u[IU]/mL (ref 0.308–3.960)

## 2019-01-18 MED ORDER — SODIUM CHLORIDE 0.9 % IV SOLN
200.0000 mg | Freq: Once | INTRAVENOUS | Status: AC
  Administered 2019-01-18: 200 mg via INTRAVENOUS
  Filled 2019-01-18: qty 8

## 2019-01-18 MED ORDER — SODIUM CHLORIDE 0.9 % IV SOLN
Freq: Once | INTRAVENOUS | Status: AC
  Administered 2019-01-18: 10:00:00 via INTRAVENOUS
  Filled 2019-01-18: qty 250

## 2019-01-18 MED ORDER — HEPARIN SOD (PORK) LOCK FLUSH 100 UNIT/ML IV SOLN
500.0000 [IU] | Freq: Once | INTRAVENOUS | Status: AC | PRN
  Administered 2019-01-18: 500 [IU]
  Filled 2019-01-18: qty 5

## 2019-01-18 MED ORDER — CYANOCOBALAMIN 1000 MCG/ML IJ SOLN
INTRAMUSCULAR | Status: AC
  Filled 2019-01-18: qty 1

## 2019-01-18 MED ORDER — PROCHLORPERAZINE MALEATE 10 MG PO TABS
ORAL_TABLET | ORAL | Status: AC
  Filled 2019-01-18: qty 1

## 2019-01-18 MED ORDER — SODIUM CHLORIDE 0.9% FLUSH
10.0000 mL | INTRAVENOUS | Status: DC | PRN
  Administered 2019-01-18: 10 mL
  Filled 2019-01-18: qty 10

## 2019-01-18 MED ORDER — CYANOCOBALAMIN 1000 MCG/ML IJ SOLN
1000.0000 ug | Freq: Once | INTRAMUSCULAR | Status: AC
  Administered 2019-01-18: 11:00:00 1000 ug via INTRAMUSCULAR

## 2019-01-18 MED ORDER — PROCHLORPERAZINE MALEATE 10 MG PO TABS
10.0000 mg | ORAL_TABLET | Freq: Once | ORAL | Status: AC
  Administered 2019-01-18: 10 mg via ORAL

## 2019-01-18 MED ORDER — SODIUM CHLORIDE 0.9 % IV SOLN
485.0000 mg/m2 | Freq: Once | INTRAVENOUS | Status: AC
  Administered 2019-01-18: 900 mg via INTRAVENOUS
  Filled 2019-01-18: qty 20

## 2019-01-18 MED ORDER — PROCHLORPERAZINE MALEATE 10 MG PO TABS: 10.0000 mg | ORAL_TABLET | Freq: Four times a day (QID) | ORAL | 2 refills | Status: DC | PRN

## 2019-01-18 NOTE — Patient Instructions (Signed)
[facility] Discharge Instructions for Patients Receiving Chemotherapy  Today you received the following chemotherapy agents Pembrolizumab (KEYTRUDA) & Pemetrexed (ALIMTA).  To help prevent nausea and vomiting after your treatment, we encourage you to take your nausea medication as prescribed.   If you develop nausea and vomiting that is not controlled by your nausea medication, call the clinic.   BELOW ARE SYMPTOMS THAT SHOULD BE REPORTED IMMEDIATELY:  *FEVER GREATER THAN 100.5 F  *CHILLS WITH OR WITHOUT FEVER  NAUSEA AND VOMITING THAT IS NOT CONTROLLED WITH YOUR NAUSEA MEDICATION  *UNUSUAL SHORTNESS OF BREATH  *UNUSUAL BRUISING OR BLEEDING  TENDERNESS IN MOUTH AND THROAT WITH OR WITHOUT PRESENCE OF ULCERS  *URINARY PROBLEMS  *BOWEL PROBLEMS  UNUSUAL RASH Items with * indicate a potential emergency and should be followed up as soon as possible.  Feel free to call the clinic should you have any questions or concerns. The clinic phone number is [phone].  Please show the [facility] at check-in to the Emergency Department and triage nurse.  Coronavirus (COVID-19) Are you at risk?  Are you at risk for the Coronavirus (COVID-19)?  To be considered HIGH RISK for Coronavirus (COVID-19), you have to meet the following criteria:  . Traveled to [place], [place], [place], [place] or [place]; or in the [place] to [place], [place], [place], or [place]; and have fever, cough, and shortness of breath within the last 2 weeks of travel OR . Been in close contact with a person diagnosed with COVID-19 within the last 2 weeks and have fever, cough, and shortness of breath . IF YOU DO NOT MEET THESE CRITERIA, YOU ARE CONSIDERED LOW RISK FOR COVID-19.  What to do if you are HIGH RISK for COVID-19?  Marland Kitchen If you are having a medical emergency, call 911. . Seek medical care right away. Before you go to a doctor's office, urgent care or emergency department,  call ahead and tell them about your recent travel, contact with someone diagnosed with COVID-19, and your symptoms. You should receive instructions from your physician's office regarding next steps of care.  . When you arrive at healthcare provider, tell the healthcare staff immediately you have returned from visiting [place], [place], [place], [place] or [place]; or traveled in the [place] to [place], [place], [place], or [place]; in the last two weeks or you have been in close contact with a person diagnosed with COVID-19 in the last 2 weeks.   . Tell the health care staff about your symptoms: fever, cough and shortness of breath. . After you have been seen by a medical provider, you will be either: o Tested for (COVID-19) and discharged home on quarantine except to seek medical care if symptoms worsen, and asked to  - Stay home and avoid contact with others until you get your results (4-5 days)  - Avoid travel on public transportation if possible (such as bus, train, or airplane) or o Sent to the Emergency Department by EMS for evaluation, COVID-19 testing, and possible admission depending on your condition and test results.  What to do if you are LOW RISK for COVID-19?  Reduce your risk of any infection by using the same precautions used for avoiding the common cold or flu:  Marland Kitchen Wash your hands often with soap and warm water for at least 20 seconds.  If soap and water are not readily available, use an alcohol-based hand sanitizer with at least 60% alcohol.  Marland Kitchen  If coughing or sneezing, cover your mouth and nose by coughing or sneezing into the elbow areas of your shirt or coat, into a tissue or into your sleeve (not your hands). . Avoid shaking hands with others and consider head nods or verbal greetings only. . Avoid touching your eyes, nose, or mouth with unwashed hands.  . Avoid close contact with people who are sick. . Avoid places or events with large numbers of people in one  location, like concerts or sporting events. . Carefully consider travel plans you have or are making. . If you are planning any travel outside or inside the [place], visit the CDC's Travelers' Health webpage for the latest health notices. . If you have some symptoms but not all symptoms, continue to monitor at home and seek medical attention if your symptoms worsen. . If you are having a medical emergency, call 911.   [employer] / e-Visit: [email or web address]         [facility]: [facility]: [phone]                   [facility]: [phone]

## 2019-01-19 ENCOUNTER — Telehealth: Payer: Self-pay | Admitting: Physician Assistant

## 2019-01-19 NOTE — Telephone Encounter (Signed)
Scheduled per los. Called and spoke with patient. Confirmed upcoming appts

## 2019-02-01 ENCOUNTER — Encounter: Payer: Self-pay | Admitting: *Deleted

## 2019-02-05 ENCOUNTER — Other Ambulatory Visit: Payer: Self-pay

## 2019-02-05 ENCOUNTER — Inpatient Hospital Stay: Payer: Medicare Other

## 2019-02-05 DIAGNOSIS — Z5111 Encounter for antineoplastic chemotherapy: Secondary | ICD-10-CM | POA: Diagnosis not present

## 2019-02-05 DIAGNOSIS — R5382 Chronic fatigue, unspecified: Secondary | ICD-10-CM

## 2019-02-05 DIAGNOSIS — C3492 Malignant neoplasm of unspecified part of left bronchus or lung: Secondary | ICD-10-CM

## 2019-02-05 DIAGNOSIS — Z95828 Presence of other vascular implants and grafts: Secondary | ICD-10-CM

## 2019-02-05 DIAGNOSIS — C3490 Malignant neoplasm of unspecified part of unspecified bronchus or lung: Secondary | ICD-10-CM

## 2019-02-05 LAB — CMP (CANCER CENTER ONLY)
ALT: 30 U/L (ref 0–44)
AST: 40 U/L (ref 15–41)
Albumin: 3.6 g/dL (ref 3.5–5.0)
Alkaline Phosphatase: 69 U/L (ref 38–126)
Anion gap: 10 (ref 5–15)
BUN: 14 mg/dL (ref 8–23)
CO2: 28 mmol/L (ref 22–32)
Calcium: 9.1 mg/dL (ref 8.9–10.3)
Chloride: 104 mmol/L (ref 98–111)
Creatinine: 1.29 mg/dL — ABNORMAL HIGH (ref 0.44–1.00)
GFR, Est AFR Am: 50 mL/min — ABNORMAL LOW (ref >60)
GFR, Estimated: 43 mL/min — ABNORMAL LOW (ref >60)
Glucose, Bld: 87 mg/dL (ref 70–99)
Potassium: 3.6 mmol/L (ref 3.5–5.1)
Sodium: 142 mmol/L (ref 135–145)
Total Bilirubin: 0.3 mg/dL (ref 0.3–1.2)
Total Protein: 7.4 g/dL (ref 6.5–8.1)

## 2019-02-05 LAB — CBC WITH DIFFERENTIAL (CANCER CENTER ONLY)
Abs Immature Granulocytes: 0.01 10*3/uL (ref 0.00–0.07)
Basophils Absolute: 0 10*3/uL (ref 0.0–0.1)
Basophils Relative: 0 %
Eosinophils Absolute: 0.2 10*3/uL (ref 0.0–0.5)
Eosinophils Relative: 4 %
HCT: 29.3 % — ABNORMAL LOW (ref 36.0–46.0)
Hemoglobin: 9 g/dL — ABNORMAL LOW (ref 12.0–15.0)
Immature Granulocytes: 0 %
Lymphocytes Relative: 23 %
Lymphs Abs: 1.2 10*3/uL (ref 0.7–4.0)
MCH: 25.9 pg — ABNORMAL LOW (ref 26.0–34.0)
MCHC: 30.7 g/dL (ref 30.0–36.0)
MCV: 84.2 fL (ref 80.0–100.0)
Monocytes Absolute: 0.9 10*3/uL (ref 0.1–1.0)
Monocytes Relative: 18 %
Neutro Abs: 2.9 10*3/uL (ref 1.7–7.7)
Neutrophils Relative %: 55 %
Platelet Count: 229 10*3/uL (ref 150–400)
RBC: 3.48 MIL/uL — ABNORMAL LOW (ref 3.87–5.11)
RDW: 20 % — ABNORMAL HIGH (ref 11.5–15.5)
WBC Count: 5.2 10*3/uL (ref 4.0–10.5)
nRBC: 0 % (ref 0.0–0.2)

## 2019-02-05 LAB — TSH: TSH: 1.377 u[IU]/mL (ref 0.308–3.960)

## 2019-02-05 MED ORDER — HEPARIN SOD (PORK) LOCK FLUSH 100 UNIT/ML IV SOLN
500.0000 [IU] | Freq: Once | INTRAVENOUS | Status: AC | PRN
  Administered 2019-02-05: 500 [IU]
  Filled 2019-02-05: qty 5

## 2019-02-05 MED ORDER — SODIUM CHLORIDE 0.9% FLUSH
10.0000 mL | INTRAVENOUS | Status: DC | PRN
  Administered 2019-02-05: 11:00:00 10 mL
  Filled 2019-02-05: qty 10

## 2019-02-06 ENCOUNTER — Other Ambulatory Visit: Payer: Self-pay | Admitting: *Deleted

## 2019-02-06 DIAGNOSIS — C7931 Secondary malignant neoplasm of brain: Secondary | ICD-10-CM

## 2019-02-07 ENCOUNTER — Telehealth: Payer: Self-pay | Admitting: Physician Assistant

## 2019-02-07 ENCOUNTER — Ambulatory Visit (HOSPITAL_COMMUNITY)
Admission: RE | Admit: 2019-02-07 | Discharge: 2019-02-07 | Disposition: A | Discharge status: Home / Self Care | Payer: Medicare Other | Source: Ambulatory Visit | Attending: Physician Assistant | Admitting: Physician Assistant

## 2019-02-07 ENCOUNTER — Other Ambulatory Visit: Payer: Self-pay | Admitting: Physician Assistant

## 2019-02-07 ENCOUNTER — Other Ambulatory Visit: Payer: Self-pay

## 2019-02-07 DIAGNOSIS — C3492 Malignant neoplasm of unspecified part of left bronchus or lung: Secondary | ICD-10-CM | POA: Diagnosis present

## 2019-02-07 DIAGNOSIS — N12 Tubulo-interstitial nephritis, not specified as acute or chronic: Secondary | ICD-10-CM

## 2019-02-07 MED ORDER — SODIUM CHLORIDE (PF) 0.9 % IJ SOLN
INTRAMUSCULAR | Status: AC
  Filled 2019-02-07: qty 50

## 2019-02-07 MED ORDER — HEPARIN SOD (PORK) LOCK FLUSH 100 UNIT/ML IV SOLN
500.0000 [IU] | Freq: Once | INTRAVENOUS | Status: AC
  Administered 2019-02-07: 500 [IU] via INTRAVENOUS

## 2019-02-07 MED ORDER — HEPARIN SOD (PORK) LOCK FLUSH 100 UNIT/ML IV SOLN
INTRAVENOUS | Status: AC
  Filled 2019-02-07: qty 5

## 2019-02-07 MED ORDER — IOHEXOL 300 MG/ML  SOLN
100.0000 mL | Freq: Once | INTRAMUSCULAR | Status: AC | PRN
  Administered 2019-02-07: 100 mL via INTRAVENOUS

## 2019-02-07 MED ORDER — CIPROFLOXACIN HCL 500 MG PO TABS: 500.0000 mg | ORAL_TABLET | Freq: Two times a day (BID) | ORAL | 0 refills | Status: DC

## 2019-02-07 NOTE — Telephone Encounter (Signed)
Called the patient to discuss her recent CT scan findings. Her scan noted suggested pyelonephritis. The patient denies any dysuria, fever, urinary frequency, or incomplete voiding. She note mild back pain and her urine having a funny odor. I sent a prescription for cipro 500 mg BID to her pharmacy. We will also check a UA and culture when she comes in the morning.

## 2019-02-08 ENCOUNTER — Inpatient Hospital Stay: Payer: Medicare Other

## 2019-02-08 ENCOUNTER — Other Ambulatory Visit: Payer: Self-pay | Admitting: Internal Medicine

## 2019-02-08 ENCOUNTER — Inpatient Hospital Stay (HOSPITAL_BASED_OUTPATIENT_CLINIC_OR_DEPARTMENT_OTHER): Payer: Medicare Other | Admitting: Internal Medicine

## 2019-02-08 ENCOUNTER — Encounter: Payer: Self-pay | Admitting: *Deleted

## 2019-02-08 ENCOUNTER — Other Ambulatory Visit: Payer: Medicare Other

## 2019-02-08 ENCOUNTER — Encounter: Payer: Self-pay | Admitting: Internal Medicine

## 2019-02-08 VITALS — BP 137/54 | HR 72 | Temp 98.7°F | Resp 17 | Ht 60.0 in | Wt 179.5 lb

## 2019-02-08 DIAGNOSIS — Z5111 Encounter for antineoplastic chemotherapy: Secondary | ICD-10-CM

## 2019-02-08 DIAGNOSIS — I25118 Atherosclerotic heart disease of native coronary artery with other forms of angina pectoris: Secondary | ICD-10-CM | POA: Diagnosis not present

## 2019-02-08 DIAGNOSIS — N12 Tubulo-interstitial nephritis, not specified as acute or chronic: Secondary | ICD-10-CM

## 2019-02-08 DIAGNOSIS — Z5112 Encounter for antineoplastic immunotherapy: Secondary | ICD-10-CM

## 2019-02-08 DIAGNOSIS — C3492 Malignant neoplasm of unspecified part of left bronchus or lung: Secondary | ICD-10-CM | POA: Diagnosis not present

## 2019-02-08 DIAGNOSIS — C7931 Secondary malignant neoplasm of brain: Secondary | ICD-10-CM | POA: Diagnosis not present

## 2019-02-08 LAB — URINALYSIS, COMPLETE (UACMP) WITH MICROSCOPIC
Bilirubin Urine: NEGATIVE
Glucose, UA: NEGATIVE mg/dL
Ketones, ur: NEGATIVE mg/dL
Leukocytes,Ua: NEGATIVE
Nitrite: NEGATIVE
Protein, ur: NEGATIVE mg/dL
Specific Gravity, Urine: 1.015 (ref 1.005–1.030)
pH: 6 (ref 5.0–8.0)

## 2019-02-08 MED ORDER — SODIUM CHLORIDE 0.9 % IV SOLN
200.0000 mg | Freq: Once | INTRAVENOUS | Status: AC
  Administered 2019-02-08: 12:00:00 200 mg via INTRAVENOUS
  Filled 2019-02-08: qty 8

## 2019-02-08 MED ORDER — SODIUM CHLORIDE 0.9 % IV SOLN
Freq: Once | INTRAVENOUS | Status: AC
  Filled 2019-02-08: qty 250

## 2019-02-08 MED ORDER — PROCHLORPERAZINE MALEATE 10 MG PO TABS
10.0000 mg | ORAL_TABLET | Freq: Once | ORAL | Status: AC
  Administered 2019-02-08: 11:00:00 10 mg via ORAL

## 2019-02-08 MED ORDER — CYANOCOBALAMIN 1000 MCG/ML IJ SOLN: 1000.0000 ug | Freq: Once | INTRAMUSCULAR | Status: DC

## 2019-02-08 MED ORDER — HEPARIN SOD (PORK) LOCK FLUSH 100 UNIT/ML IV SOLN
500.0000 [IU] | Freq: Once | INTRAVENOUS | Status: AC | PRN
  Administered 2019-02-08: 500 [IU]
  Filled 2019-02-08: qty 5

## 2019-02-08 MED ORDER — SODIUM CHLORIDE 0.9 % IV SOLN
485.0000 mg/m2 | Freq: Once | INTRAVENOUS | Status: AC
  Administered 2019-02-08: 900 mg via INTRAVENOUS
  Filled 2019-02-08: qty 20

## 2019-02-08 MED ORDER — PROCHLORPERAZINE MALEATE 10 MG PO TABS
ORAL_TABLET | ORAL | Status: AC
  Filled 2019-02-08: qty 1

## 2019-02-08 MED ORDER — SODIUM CHLORIDE 0.9% FLUSH
10.0000 mL | INTRAVENOUS | Status: DC | PRN
  Administered 2019-02-08: 10 mL
  Filled 2019-02-08: qty 10

## 2019-02-08 NOTE — Patient Instructions (Signed)
[facility] Discharge Instructions for Patients Receiving Chemotherapy  Today you received the following chemotherapy agents: Pembrolizumab (Keytruda) and Pemetrexed (Alimta)  To help prevent nausea and vomiting after your treatment, we encourage you to take your nausea medication as directed.   If you develop nausea and vomiting that is not controlled by your nausea medication, call the clinic.   BELOW ARE SYMPTOMS THAT SHOULD BE REPORTED IMMEDIATELY:  *FEVER GREATER THAN 100.5 F  *CHILLS WITH OR WITHOUT FEVER  NAUSEA AND VOMITING THAT IS NOT CONTROLLED WITH YOUR NAUSEA MEDICATION  *UNUSUAL SHORTNESS OF BREATH  *UNUSUAL BRUISING OR BLEEDING  TENDERNESS IN MOUTH AND THROAT WITH OR WITHOUT PRESENCE OF ULCERS  *URINARY PROBLEMS  *BOWEL PROBLEMS  UNUSUAL RASH Items with * indicate a potential emergency and should be followed up as soon as possible.  Feel free to call the clinic should you have any questions or concerns. The clinic phone number is [phone].  Please show the [place] at check-in to the Emergency Department and triage nurse.  Coronavirus (COVID-19) Are you at risk?  Are you at risk for the Coronavirus (COVID-19)?  To be considered HIGH RISK for Coronavirus (COVID-19), you have to meet the following criteria:  . Traveled to [place], [place], [place], [place] or [place]; or in the [place] to [place], [place], [place], or [place]; and have fever, cough, and shortness of breath within the last 2 weeks of travel OR . Been in close contact with a person diagnosed with COVID-19 within the last 2 weeks and have fever, cough, and shortness of breath . IF YOU DO NOT MEET THESE CRITERIA, YOU ARE CONSIDERED LOW RISK FOR COVID-19.  What to do if you are HIGH RISK for COVID-19?  Marland Kitchen If you are having a medical emergency, call 911. . Seek medical care right away. Before you go to a doctor's office, urgent care or emergency department,  call ahead and tell them about your recent travel, contact with someone diagnosed with COVID-19, and your symptoms. You should receive instructions from your physician's office regarding next steps of care.  . When you arrive at healthcare provider, tell the healthcare staff immediately you have returned from visiting [place], [place], [place], [place] or [place]; or traveled in the [place] to [place], [place], [place], or [place]; in the last two weeks or you have been in close contact with a person diagnosed with COVID-19 in the last 2 weeks.   . Tell the health care staff about your symptoms: fever, cough and shortness of breath. . After you have been seen by a medical provider, you will be either: o Tested for (COVID-19) and discharged home on quarantine except to seek medical care if symptoms worsen, and asked to  - Stay home and avoid contact with others until you get your results (4-5 days)  - Avoid travel on public transportation if possible (such as bus, train, or airplane) or o Sent to the Emergency Department by EMS for evaluation, COVID-19 testing, and possible admission depending on your condition and test results.  What to do if you are LOW RISK for COVID-19?  Reduce your risk of any infection by using the same precautions used for avoiding the common cold or flu:  Marland Kitchen Wash your hands often with soap and warm water for at least 20 seconds.  If soap and water are not readily available, use an alcohol-based hand sanitizer with at least 60% alcohol.  Marland Kitchen  If coughing or sneezing, cover your mouth and nose by coughing or sneezing into the elbow areas of your shirt or coat, into a tissue or into your sleeve (not your hands). . Avoid shaking hands with others and consider head nods or verbal greetings only. . Avoid touching your eyes, nose, or mouth with unwashed hands.  . Avoid close contact with people who are sick. . Avoid places or events with large numbers of people in one  location, like concerts or sporting events. . Carefully consider travel plans you have or are making. . If you are planning any travel outside or inside the [place], visit the CDC's Travelers' Health webpage for the latest health notices. . If you have some symptoms but not all symptoms, continue to monitor at home and seek medical attention if your symptoms worsen. . If you are having a medical emergency, call 911.   [place] / e-Visit: [email or web address]         [facility]: [facility]: [phone]                   [facility]: [phone]

## 2019-02-08 NOTE — Progress Notes (Signed)
[place] [phone]   [phone]  OFFICE PROGRESS NOTE  [name], MD [address] [place] [address]  DIAGNOSIS: Stage IV (T1c, N1, M1c ) non-small cell lung cancer, adenocarcinoma diagnosed in July 2020 and presented with left upper lobe lung nodule in addition to right upper lobe lung nodule with left hilar lymphadenopathy as well as multiple brain metastasis.  Biomarker Findings Microsatellite status - MS-Stable Tumor Mutational Burden - 4 Muts/Mb Genomic Findings For a complete list of the genes assayed, please refer to the Appendix. ATM G204* KRAS G12C PDGFRA P122T SMO R199W 7 Disease relevant genes with no reportable alterations: ALK, BRAF, EGFR, ERBB2, MET, RET, ROS1  PDL 1 expression was 1%  PRIOR THERAPY: SBRT to multiple brain metastasis under the care of Dr. Isidore Moos.  CURRENT THERAPY: Systemic chemotherapy with carboplatin for AUC of 5, Alimta 500 mg/M2 and Keytruda 200 mg IV every 3 weeks.  First dose October 05, 2018.  Status post 6 cycles.  Starting from cycle #5 the patient will be on treatment with maintenance Alimta 500 mg/M2 and Keytruda 200 mg IV every 3 weeks.  INTERVAL HISTORY: Carolyn [name] 63 y.o. female returns to the clinic today for follow-up visit.  The patient is feeling fine today with no concerning complaints except for change in the smell of her urine and flank pain started few days ago.  She denied having any hematuria or dysuria.  She denied having any chest pain, shortness of breath, cough or hemoptysis.  She has no nausea, vomiting, diarrhea or constipation.  She denied having any headache or visual changes.  She has no fever or chills.  The patient had repeat CT scan of the chest, abdomen pelvis performed recently and she is here for evaluation and discussion of her scan results.  MEDICAL HISTORY: Past Medical History:  Diagnosis Date  . Anginal pain ([place])    " with exertion "   . Arthritis    " 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 "  . Asthma    h/o  . Coronary artery disease   . Diabetes mellitus without complication ([place])    Type II  . GERD (gastroesophageal reflux disease)   . H/O hiatal hernia   . Heart murmur   . History of radiation therapy 09/22/2018   stereotactic radiosurgery   . 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 Hypertension   . nscl ca dx'd 08/07/18  . Persistent headaches    h/o  . Pneumonia    hx of PNA  . Shortness of breath   . Vaginal dryness    h/o    ALLERGIES:  is allergic to lisinopril.  MEDICATIONS:  Current Outpatient Medications  Medication Sig Dispense Refill  . acetaminophen (TYLENOL) 500 MG tablet Take 1,000 mg by mouth every 8 (eight) hours as needed for mild pain, fever or headache.     Marland Kitchen amLODipine (NORVASC) 5 MG tablet Take 5 mg by mouth at bedtime.     Marland Kitchen aspirin EC 81 MG tablet Take 81 mg by mouth daily.    . carvedilol (COREG) 25 MG tablet Take 12.5 mg by mouth 2 (two) times daily with a meal.     . cetirizine (ZYRTEC) 10 MG tablet Take 10 mg by mouth daily.    . Cholecalciferol (VITAMIN D-3) 125 MCG (5000 UT) TABS Take 5,000 Units by mouth daily.    . Chromium Picolinate 1000 MCG TABS Take 1,000 mg by  mouth daily.    . ciprofloxacin (CIPRO) 500 MG tablet Take 1 tablet (500 mg total) by mouth 2 (two) times daily. 14 tablet 0  . co-enzyme Q-10 30 MG capsule Take 30 mg by mouth daily.     Marland Kitchen esomeprazole (NEXIUM) 20 MG capsule Take 20 mg by mouth daily at 12 noon.    . ferrous sulfate 325 (65 FE) MG tablet Take 325 mg by mouth daily with breakfast.    . folic acid (FOLVITE) 1 MG tablet Take 1 tablet (1 mg total) by mouth daily. 30 tablet 4  . hydrochlorothiazide (MICROZIDE) 12.5 MG capsule Take 12.5 mg by mouth daily.    Marland Kitchen lidocaine-prilocaine (EMLA) cream Apply to the Port-A-Cath site 30 to 60 minutes before chemotherapy. 30 g 0  . metFORMIN (GLUCOPHAGE) 500 MG tablet Take 500 mg by mouth 2 (two) times daily with a meal.     . methocarbamol  (ROBAXIN) 500 MG tablet Take 1 tablet (500 mg total) by mouth every 6 (six) hours as needed for muscle spasms. 21 tablet 0  . nitroGLYCERIN (NITROSTAT) 0.4 MG SL tablet Place 1 tablet (0.4 mg total) under the tongue every 5 (five) minutes as needed for chest pain. 30 tablet 12  . ONETOUCH ULTRA test strip USE 1 STRIP TO CHECK GLUCOSE THREE TIMES DAILY    . Pitavastatin Calcium (LIVALO) 4 MG TABS Take 4 mg by mouth at bedtime.     Vladimir Faster Glycol-Propyl Glycol (SYSTANE) 0.4-0.3 % SOLN Place 1 drop into both eyes every 6 (six) hours as needed (dry eyes).     . prochlorperazine (COMPAZINE) 10 MG tablet Take 1 tablet (10 mg total) by mouth every 6 (six) hours as needed for nausea or vomiting. 30 tablet 2  . psyllium (METAMUCIL) 58.6 % packet Take 1 packet by mouth at bedtime as needed (constipation).     Marland Kitchen telmisartan (MICARDIS) 40 MG tablet Take 40 mg by mouth daily.    Marland Kitchen zolpidem (AMBIEN) 10 MG tablet Take 2.5 mg by mouth at bedtime.      No current facility-administered medications for this visit.    SURGICAL HISTORY:  Past Surgical History:  Procedure Laterality Date  . ABDOMINAL HYSTERECTOMY    . bone spur    . CARDIAC CATHETERIZATION  06/13/2012  . carpel tunnel surgery Left   . COLONOSCOPY     9 years ago  . CORONARY ANGIOPLASTY  06/13/2012  . EYE SURGERY     cyst left eye   . IR IMAGING GUIDED PORT INSERTION  10/10/2018  . LEFT HEART CATHETERIZATION WITH CORONARY ANGIOGRAM N/A 06/13/2012   Procedure: LEFT HEART CATHETERIZATION WITH CORONARY ANGIOGRAM;  Surgeon: Laverda Page, MD;  Location: [facility] CATH LAB;  Service: Cardiovascular;  Laterality: N/A;  . NECK SURGERY    . rotator cuff surgery Right 2015  . VIDEO BRONCHOSCOPY WITH ENDOBRONCHIAL NAVIGATION N/A 09/06/2018   Procedure: VIDEO BRONCHOSCOPY WITH ENDOBRONCHIAL NAVIGATION, left upper lung;  Surgeon: Lajuana Matte, MD;  Location: [facility];  Service: Thoracic;  Laterality: N/A;  . VIDEO BRONCHOSCOPY WITH ENDOBRONCHIAL  ULTRASOUND Left 09/06/2018   Procedure: VIDEO BRONCHOSCOPY WITH ENDOBRONCHIAL ULTRASOUND, left lung;  Surgeon: Lajuana Matte, MD;  Location: [facility];  Service: Thoracic;  Laterality: Left;  . WISDOM TOOTH EXTRACTION      REVIEW OF SYSTEMS:  Constitutional: negative Eyes: negative Ears, nose, mouth, throat, and face: negative Respiratory: negative Cardiovascular: negative Gastrointestinal: negative Genitourinary:positive for frequency Integument/breast: negative Hematologic/lymphatic: negative Musculoskeletal:negative Neurological: negative Behavioral/Psych:  negative Endocrine: negative Allergic/Immunologic: negative   PHYSICAL EXAMINATION: General appearance: alert, cooperative and no distress Head: Normocephalic, without obvious abnormality, atraumatic Neck: no adenopathy, no JVD, supple, symmetrical, trachea midline and thyroid not enlarged, symmetric, no tenderness/mass/nodules Lymph nodes: Cervical, supraclavicular, and axillary nodes normal. Resp: clear to auscultation bilaterally Back: symmetric, no curvature. ROM normal. No CVA tenderness. Cardio: regular rate and rhythm, S1, S2 normal, no murmur, click, rub or gallop GI: soft, non-tender; bowel sounds normal; no masses,  no organomegaly Extremities: extremities normal, atraumatic, no cyanosis or edema Neurologic: Alert and oriented X 3, normal strength and tone. Normal symmetric reflexes. Normal coordination and gait  ECOG PERFORMANCE STATUS: 1 - Symptomatic but completely ambulatory  Blood pressure (!) 137/54, pulse 72, temperature 98.7 F (37.1 C), temperature source Temporal, resp. rate 17, height 5' (1.524 m), weight 179 lb 8 oz (81.4 kg), SpO2 99 %.  LABORATORY DATA: Lab Results  Component Value Date   WBC 5.2 02/05/2019   HGB 9.0 (L) 02/05/2019   HCT 29.3 (L) 02/05/2019   MCV 84.2 02/05/2019   PLT 229 02/05/2019      Chemistry      Component Value Date/Time   NA 142 02/05/2019 1032   K 3.6  02/05/2019 1032   CL 104 02/05/2019 1032   CO2 28 02/05/2019 1032   BUN 14 02/05/2019 1032   CREATININE 1.29 (H) 02/05/2019 1032      Component Value Date/Time   CALCIUM 9.1 02/05/2019 1032   ALKPHOS 69 02/05/2019 1032   AST 40 02/05/2019 1032   ALT 30 02/05/2019 1032   BILITOT 0.3 02/05/2019 1032       RADIOGRAPHIC STUDIES: CT Chest W Contrast  Result Date: 02/07/2019 CLINICAL DATA:  Adenocarcinoma of left lung. Brain metastasis. Left upper lobe primary. Status post chemotherapy and immunotherapy. EXAM: CT CHEST, ABDOMEN, AND PELVIS WITH CONTRAST TECHNIQUE: Multidetector CT imaging of the chest, abdomen and pelvis was performed following the standard protocol during bolus administration of intravenous contrast. CONTRAST:  149m OMNIPAQUE IOHEXOL 300 MG/ML  SOLN COMPARISON:  12/05/2018 FINDINGS: CT CHEST FINDINGS Cardiovascular: Right Port-A-Cath tip at high right atrium. Bovine arch. Tortuous thoracic aorta. Mild cardiomegaly, with minimal, likely physiologic pericardial fluid. Multivessel coronary artery atherosclerosis. No central pulmonary embolism, on this non-dedicated study. Mediastinum/Nodes: No supraclavicular adenopathy. No mediastinal or hilar adenopathy. Tiny hiatal hernia. Lungs/Pleura: No pleural fluid. Subpleural right upper lobe pulmonary nodule measures 7 mm on 45/6 and is similar to on the prior exam (when remeasured). Right lower lobe vague subpleural 3 mm pulmonary nodule on 70/6, similar. Spiculated posterior left upper lobe pulmonary nodule measures 9 x 9 mm on 55/6 and is similar to on the prior exam (when remeasured). A vague subpleural anterior left lower lobe 4 mm nodule on 70/6 is unchanged. Musculoskeletal: No acute osseous abnormality. Lower cervical spine fixation. CT ABDOMEN PELVIS FINDINGS Hepatobiliary: Normal liver. Normal gallbladder, without biliary ductal dilatation. Pancreas: Normal, without mass or ductal dilatation. Spleen: Normal in size, without focal  abnormality. Adrenals/Urinary Tract: Normal adrenal glands. Development of bilateral heterogeneous renal enhancement, most apparent on delayed images, including 18/7. No hydronephrosis. Too small to characterize left renal lesions. Equivocal edema anterior to the urinary bladder on 101/2. Stomach/Bowel: Normal stomach, without wall thickening. Transverse duodenal diverticulum. Otherwise normal small bowel. Normal terminal ileum and appendix. Vascular/Lymphatic: Advanced aortic and branch vessel atherosclerosis. No abdominopelvic adenopathy. Reproductive: Hysterectomy.  No adnexal mass. Other: No significant free fluid. Mild pelvic floor laxity. No evidence of omental or peritoneal  disease. Musculoskeletal: Similar grade 1 L4-5 anterolisthesis, likely degenerative. Lumbosacral spondylosis. IMPRESSION: 1. Similar bilateral pulmonary nodules, including the dominant left upper lobe pulmonary nodule. 2. No thoracic adenopathy. 3. No new or progressive disease. 4. Bilateral heterogeneous renal enhancement, consistent with pyelonephritis. Equivocal pericystic edema which could represent cystitis. 5. Age advanced coronary artery atherosclerosis. Recommend assessment of coronary risk factors and consideration of medical therapy. 6.  Aortic Atherosclerosis (ICD10-I70.0). These results will be called to the ordering clinician or representative by the Radiologist Assistant, and communication documented in the PACS or zVision Dashboard. Electronically Signed   By: Abigail Miyamoto M.D.   On: 02/07/2019 16:39   CT Abdomen Pelvis W Contrast  Result Date: 02/07/2019 CLINICAL DATA:  Adenocarcinoma of left lung. Brain metastasis. Left upper lobe primary. Status post chemotherapy and immunotherapy. EXAM: CT CHEST, ABDOMEN, AND PELVIS WITH CONTRAST TECHNIQUE: Multidetector CT imaging of the chest, abdomen and pelvis was performed following the standard protocol during bolus administration of intravenous contrast. CONTRAST:  178m  OMNIPAQUE IOHEXOL 300 MG/ML  SOLN COMPARISON:  12/05/2018 FINDINGS: CT CHEST FINDINGS Cardiovascular: Right Port-A-Cath tip at high right atrium. Bovine arch. Tortuous thoracic aorta. Mild cardiomegaly, with minimal, likely physiologic pericardial fluid. Multivessel coronary artery atherosclerosis. No central pulmonary embolism, on this non-dedicated study. Mediastinum/Nodes: No supraclavicular adenopathy. No mediastinal or hilar adenopathy. Tiny hiatal hernia. Lungs/Pleura: No pleural fluid. Subpleural right upper lobe pulmonary nodule measures 7 mm on 45/6 and is similar to on the prior exam (when remeasured). Right lower lobe vague subpleural 3 mm pulmonary nodule on 70/6, similar. Spiculated posterior left upper lobe pulmonary nodule measures 9 x 9 mm on 55/6 and is similar to on the prior exam (when remeasured). A vague subpleural anterior left lower lobe 4 mm nodule on 70/6 is unchanged. Musculoskeletal: No acute osseous abnormality. Lower cervical spine fixation. CT ABDOMEN PELVIS FINDINGS Hepatobiliary: Normal liver. Normal gallbladder, without biliary ductal dilatation. Pancreas: Normal, without mass or ductal dilatation. Spleen: Normal in size, without focal abnormality. Adrenals/Urinary Tract: Normal adrenal glands. Development of bilateral heterogeneous renal enhancement, most apparent on delayed images, including 18/7. No hydronephrosis. Too small to characterize left renal lesions. Equivocal edema anterior to the urinary bladder on 101/2. Stomach/Bowel: Normal stomach, without wall thickening. Transverse duodenal diverticulum. Otherwise normal small bowel. Normal terminal ileum and appendix. Vascular/Lymphatic: Advanced aortic and branch vessel atherosclerosis. No abdominopelvic adenopathy. Reproductive: Hysterectomy.  No adnexal mass. Other: No significant free fluid. Mild pelvic floor laxity. No evidence of omental or peritoneal disease. Musculoskeletal: Similar grade 1 L4-5 anterolisthesis, likely  degenerative. Lumbosacral spondylosis. IMPRESSION: 1. Similar bilateral pulmonary nodules, including the dominant left upper lobe pulmonary nodule. 2. No thoracic adenopathy. 3. No new or progressive disease. 4. Bilateral heterogeneous renal enhancement, consistent with pyelonephritis. Equivocal pericystic edema which could represent cystitis. 5. Age advanced coronary artery atherosclerosis. Recommend assessment of coronary risk factors and consideration of medical therapy. 6.  Aortic Atherosclerosis (ICD10-I70.0). These results will be called to the ordering clinician or representative by the Radiologist Assistant, and communication documented in the PACS or zVision Dashboard. Electronically Signed   By: KAbigail MiyamotoM.D.   On: 02/07/2019 16:39    ASSESSMENT AND PLAN: This is a very pleasant 63years old African-American female with likely stage IV (T1c, N1, M1C) lung cancer pending tissue diagnosis and presented with left upper lobe lung nodule in addition to right upper lobe pulmonary nodule as well as left hilar adenopathy and metastatic lesion to the brain. Molecular studies showed no actionable mutation  and PDL 1 expression was 1%. The patient underwent SBRT to the metastatic brain lesion under the care of Dr. Isidore Moos. The patient is currently undergoing systemic chemotherapy with carboplatin for AUC of 5, Alimta 500 mg/M2 and Keytruda 200 mg IV every 3 weeks is status post 6 cycles.  The patient continues to tolerate this treatment well with no concerning adverse effects. She had repeat CT scan of the chest, abdomen pelvis performed recently.  I personally and independently reviewed the scans and discussed the results with the patient today. Her scan showed no concerning findings for disease progression but there was some signs of pyelonephritis. I recommended for the patient to proceed with her cycle #7 of the treatment today as planned. For the pyelonephritis we started the patient empirically on  Cipro 500 mg p.o. twice daily and we will check her urinalysis and culture and sensitivity today. She will come back for follow-up visit in 3 weeks for evaluation before starting cycle #8.  The patient was advised to call immediately if she has any concerning symptoms in the interval. The patient voices understanding of current disease status and treatment options and is in agreement with the current care plan. All questions were answered. The patient knows to call the clinic with any problems, questions or concerns. We can certainly see the patient much sooner if necessary.  Disclaimer: This note was dictated with voice recognition software. Similar sounding words can inadvertently be transcribed and may not be corrected upon review.

## 2019-02-08 NOTE — Progress Notes (Signed)
Oncology Nurse Navigator Documentation  Oncology Nurse Navigator Flowsheets 02/08/2019  Abnormal Finding Date -  Confirmed Diagnosis Date -  Diagnosis Status -  Planned Course of Treatment -  Phase of Treatment -  Chemotherapy Actual Start Date: -  Navigator Follow Up Date: -  Navigator Follow Up Reason: -  Navigation Complete Date: -  Reason Not Navigating Patient: -  Navigator Location [facility]-  Referral Date to RadOnc/MedOnc -  Navigator Encounter Type Clinic/MDC  Telephone -  Multidisiplinary Clinic Date -  Multidisiplinary Clinic Type -  Patient Visit Type -  Treatment Phase Treatment  Barriers/Navigation Needs Education  Education Other  Interventions Education  Acuity Level 2-Minimal Needs (1-2 Barriers Identified)  Coordination of Care -  Education Method Verbal  Time Spent with Patient 15

## 2019-02-09 LAB — URINE CULTURE: Culture: <10000 — AB

## 2019-02-21 ENCOUNTER — Other Ambulatory Visit: Payer: Self-pay | Admitting: Radiation Therapy

## 2019-02-26 DIAGNOSIS — E119 Type 2 diabetes mellitus without complications: Secondary | ICD-10-CM | POA: Diagnosis not present

## 2019-02-26 DIAGNOSIS — I10 Essential (primary) hypertension: Secondary | ICD-10-CM | POA: Diagnosis not present

## 2019-03-01 ENCOUNTER — Inpatient Hospital Stay: Payer: Medicare Other | Attending: Physician Assistant | Admitting: Internal Medicine

## 2019-03-01 ENCOUNTER — Inpatient Hospital Stay: Payer: Medicare Other

## 2019-03-01 ENCOUNTER — Other Ambulatory Visit: Payer: Self-pay

## 2019-03-01 ENCOUNTER — Encounter: Payer: Self-pay | Admitting: Internal Medicine

## 2019-03-01 VITALS — BP 138/70 | HR 70 | Temp 98.7°F | Resp 16 | Ht 60.0 in | Wt 180.6 lb

## 2019-03-01 DIAGNOSIS — Z5111 Encounter for antineoplastic chemotherapy: Secondary | ICD-10-CM | POA: Diagnosis not present

## 2019-03-01 DIAGNOSIS — C3492 Malignant neoplasm of unspecified part of left bronchus or lung: Secondary | ICD-10-CM

## 2019-03-01 DIAGNOSIS — R5382 Chronic fatigue, unspecified: Secondary | ICD-10-CM

## 2019-03-01 DIAGNOSIS — I10 Essential (primary) hypertension: Secondary | ICD-10-CM | POA: Diagnosis not present

## 2019-03-01 DIAGNOSIS — Z5112 Encounter for antineoplastic immunotherapy: Secondary | ICD-10-CM | POA: Diagnosis not present

## 2019-03-01 DIAGNOSIS — C3412 Malignant neoplasm of upper lobe, left bronchus or lung: Secondary | ICD-10-CM | POA: Insufficient documentation

## 2019-03-01 DIAGNOSIS — C7931 Secondary malignant neoplasm of brain: Secondary | ICD-10-CM | POA: Diagnosis not present

## 2019-03-01 DIAGNOSIS — C3490 Malignant neoplasm of unspecified part of unspecified bronchus or lung: Secondary | ICD-10-CM

## 2019-03-01 DIAGNOSIS — Z95828 Presence of other vascular implants and grafts: Secondary | ICD-10-CM

## 2019-03-01 DIAGNOSIS — Z79899 Other long term (current) drug therapy: Secondary | ICD-10-CM | POA: Diagnosis not present

## 2019-03-01 LAB — CMP (CANCER CENTER ONLY)
ALT: 24 U/L (ref 0–44)
AST: 37 U/L (ref 15–41)
Albumin: 3.7 g/dL (ref 3.5–5.0)
Alkaline Phosphatase: 67 U/L (ref 38–126)
Anion gap: 11 (ref 5–15)
BUN: 11 mg/dL (ref 8–23)
CO2: 27 mmol/L (ref 22–32)
Calcium: 8.7 mg/dL — ABNORMAL LOW (ref 8.9–10.3)
Chloride: 104 mmol/L (ref 98–111)
Creatinine: 1.26 mg/dL — ABNORMAL HIGH (ref 0.44–1.00)
GFR, Est AFR Am: 52 mL/min — ABNORMAL LOW (ref >60)
GFR, Estimated: 45 mL/min — ABNORMAL LOW (ref >60)
Glucose, Bld: 117 mg/dL — ABNORMAL HIGH (ref 70–99)
Potassium: 3.4 mmol/L — ABNORMAL LOW (ref 3.5–5.1)
Sodium: 142 mmol/L (ref 135–145)
Total Bilirubin: 0.3 mg/dL (ref 0.3–1.2)
Total Protein: 7.4 g/dL (ref 6.5–8.1)

## 2019-03-01 LAB — CBC WITH DIFFERENTIAL (CANCER CENTER ONLY)
Abs Immature Granulocytes: 0.02 10*3/uL (ref 0.00–0.07)
Basophils Absolute: 0 10*3/uL (ref 0.0–0.1)
Basophils Relative: 1 %
Eosinophils Absolute: 0.2 10*3/uL (ref 0.0–0.5)
Eosinophils Relative: 4 %
HCT: 28.2 % — ABNORMAL LOW (ref 36.0–46.0)
Hemoglobin: 8.8 g/dL — ABNORMAL LOW (ref 12.0–15.0)
Immature Granulocytes: 0 %
Lymphocytes Relative: 23 %
Lymphs Abs: 1.3 10*3/uL (ref 0.7–4.0)
MCH: 26.6 pg (ref 26.0–34.0)
MCHC: 31.2 g/dL (ref 30.0–36.0)
MCV: 85.2 fL (ref 80.0–100.0)
Monocytes Absolute: 0.9 10*3/uL (ref 0.1–1.0)
Monocytes Relative: 15 %
Neutro Abs: 3.3 10*3/uL (ref 1.7–7.7)
Neutrophils Relative %: 57 %
Platelet Count: 295 10*3/uL (ref 150–400)
RBC: 3.31 MIL/uL — ABNORMAL LOW (ref 3.87–5.11)
RDW: 18.6 % — ABNORMAL HIGH (ref 11.5–15.5)
WBC Count: 5.8 10*3/uL (ref 4.0–10.5)
nRBC: 0 % (ref 0.0–0.2)

## 2019-03-01 LAB — TSH: TSH: 3.136 u[IU]/mL (ref 0.308–3.960)

## 2019-03-01 MED ORDER — SODIUM CHLORIDE 0.9% FLUSH
10.0000 mL | INTRAVENOUS | Status: DC | PRN
  Administered 2019-03-01: 10 mL
  Filled 2019-03-01: qty 10

## 2019-03-01 MED ORDER — PROCHLORPERAZINE MALEATE 10 MG PO TABS
10.0000 mg | ORAL_TABLET | Freq: Once | ORAL | Status: AC
  Administered 2019-03-01: 10 mg via ORAL

## 2019-03-01 MED ORDER — SODIUM CHLORIDE 0.9 % IV SOLN
500.0000 mg/m2 | Freq: Once | INTRAVENOUS | Status: AC
  Administered 2019-03-01: 900 mg via INTRAVENOUS
  Filled 2019-03-01: qty 20

## 2019-03-01 MED ORDER — SODIUM CHLORIDE 0.9 % IV SOLN
200.0000 mg | Freq: Once | INTRAVENOUS | Status: AC
  Administered 2019-03-01: 200 mg via INTRAVENOUS
  Filled 2019-03-01: qty 8

## 2019-03-01 MED ORDER — HEPARIN SOD (PORK) LOCK FLUSH 100 UNIT/ML IV SOLN
500.0000 [IU] | Freq: Once | INTRAVENOUS | Status: AC | PRN
  Administered 2019-03-01: 500 [IU]
  Filled 2019-03-01: qty 5

## 2019-03-01 MED ORDER — SODIUM CHLORIDE 0.9 % IV SOLN
Freq: Once | INTRAVENOUS | Status: DC
  Filled 2019-03-01: qty 250

## 2019-03-01 MED ORDER — SODIUM CHLORIDE 0.9 % IV SOLN
Freq: Once | INTRAVENOUS | Status: AC
  Filled 2019-03-01: qty 250

## 2019-03-01 MED ORDER — PROCHLORPERAZINE MALEATE 10 MG PO TABS
ORAL_TABLET | ORAL | Status: AC
  Filled 2019-03-01: qty 1

## 2019-03-01 NOTE — Patient Instructions (Signed)
[place] Discharge Instructions for Patients Receiving Chemotherapy  Today you received the following chemotherapy agents:  Alimta & immunotherapy Keytruda  To help prevent nausea and vomiting after your treatment, we encourage you to take your nausea medication as prescribed.    If you develop nausea and vomiting that is not controlled by your nausea medication, call the clinic.   BELOW ARE SYMPTOMS THAT SHOULD BE REPORTED IMMEDIATELY:  *FEVER GREATER THAN 100.5 F  *CHILLS WITH OR WITHOUT FEVER  NAUSEA AND VOMITING THAT IS NOT CONTROLLED WITH YOUR NAUSEA MEDICATION  *UNUSUAL SHORTNESS OF BREATH  *UNUSUAL BRUISING OR BLEEDING  TENDERNESS IN MOUTH AND THROAT WITH OR WITHOUT PRESENCE OF ULCERS  *URINARY PROBLEMS  *BOWEL PROBLEMS  UNUSUAL RASH Items with * indicate a potential emergency and should be followed up as soon as possible.  Feel free to call the clinic should you have any questions or concerns. The clinic phone number is [phone].  Please show the [place] at check-in to the Emergency Department and triage nurse.  Take your oral iron & include iron rich foods along with potassium rich foods in your diet.  Drink plenty of fluids.   Iron-Rich Diet  Iron is a mineral that helps your body to produce hemoglobin. Hemoglobin is a protein in red blood cells that carries oxygen to your body's tissues. Eating too little iron may cause you to feel weak and tired, and it can increase your risk of infection. Iron is naturally found in many foods, and many foods have iron added to them (iron-fortified foods). You may need to follow an iron-rich diet if you do not have enough iron in your body due to certain medical conditions. The amount of iron that you need each day depends on your age, your sex, and any medical conditions you have. Follow instructions from your health care provider or a diet and nutrition specialist (dietitian) about how much iron you  should eat each day. What are tips for following this plan? Reading food labels  Check food labels to see how many milligrams (mg) of iron are in each serving. Cooking  Cook foods in pots and pans that are made from iron.  Take these steps to make it easier for your body to absorb iron from certain foods: ? Soak beans overnight before cooking. ? Soak whole grains overnight and drain them before using. ? Ferment flours before baking, such as by using yeast in bread dough. Meal planning  When you eat foods that contain iron, you should eat them with foods that are high in vitamin C. These include oranges, peppers, tomatoes, potatoes, and mango. Vitamin C helps your body to absorb iron. General information  Take iron supplements only as told by your health care provider. An overdose of iron can be life-threatening. If you were prescribed iron supplements, take them with orange juice or a vitamin C supplement.  When you eat iron-fortified foods or take an iron supplement, you should also eat foods that naturally contain iron, such as meat, poultry, and fish. Eating naturally iron-rich foods helps your body to absorb the iron that is added to other foods or contained in a supplement.  Certain foods and drinks prevent your body from absorbing iron properly. Avoid eating these foods in the same meal as iron-rich foods or with iron supplements. These foods include: ? Coffee, black tea, and red wine. ? Milk, dairy products, and foods that are high in calcium. ? Beans and soybeans. ?  Whole grains. What foods should I eat? Fruits Prunes. Raisins. Eat fruits high in vitamin C, such as oranges, grapefruits, and strawberries, alongside iron-rich foods. Vegetables Spinach (cooked). Green peas. Broccoli. Fermented vegetables. Eat vegetables high in vitamin C, such as leafy greens, potatoes, bell peppers, and tomatoes, alongside iron-rich foods. Grains Iron-fortified breakfast cereal.  Iron-fortified whole-wheat bread. Enriched rice. Sprouted grains. Meats and other proteins Beef liver. Oysters. Beef. Shrimp. [place]. Chicken. [place]. Sardines. Chickpeas. Nuts. Tofu. Pumpkin seeds. Beverages Tomato juice. Fresh orange juice. Prune juice. Hibiscus tea. Fortified instant breakfast shakes. Sweets and desserts Blackstrap molasses. Seasonings and condiments Tahini. Fermented soy sauce. Other foods Wheat germ. The items listed above may not be a complete list of recommended foods and beverages. Contact a dietitian for more information. What foods should I avoid? Grains Whole grains. Bran cereal. Bran flour. Oats. Meats and other proteins Soybeans. Products made from soy protein. Black beans. Lentils. Mung beans. Split peas. Dairy Milk. Cream. Cheese. Yogurt. Cottage cheese. Beverages Coffee. Black tea. Red wine. Sweets and desserts Cocoa. Chocolate. Ice cream. Other foods Basil. Oregano. Large amounts of parsley. The items listed above may not be a complete list of foods and beverages to avoid. Contact a dietitian for more information. Summary  Iron is a mineral that helps your body to produce hemoglobin. Hemoglobin is a protein in red blood cells that carries oxygen to your body's tissues.  Iron is naturally found in many foods, and many foods have iron added to them (iron-fortified foods).  When you eat foods that contain iron, you should eat them with foods that are high in vitamin C. Vitamin C helps your body to absorb iron.  Certain foods and drinks prevent your body from absorbing iron properly, such as whole grains and dairy products. You should avoid eating these foods in the same meal as iron-rich foods or with iron supplements. This information is not intended to replace advice given to you by your health care provider. Make sure you discuss any questions you have with your health care provider. Document Revised: 01/07/2017 Document Reviewed: 12/21/2016  Elsevier Patient Education  2020 [employer].  See separate list of potassium rich foods.

## 2019-03-01 NOTE — Progress Notes (Signed)
[facility] [phone]   [phone]  OFFICE PROGRESS NOTE  [name], MD [address] [facility] [address]  DIAGNOSIS: Stage IV (T1c, N1, M1c ) non-small cell lung cancer, adenocarcinoma diagnosed in July 2020 and presented with left upper lobe lung nodule in addition to right upper lobe lung nodule with left hilar lymphadenopathy as well as multiple brain metastasis.  Biomarker Findings Microsatellite status - MS-Stable Tumor Mutational Burden - 4 Muts/Mb Genomic Findings For a complete list of the genes assayed, please refer to the Appendix. ATM G204* KRAS G12C PDGFRA P122T SMO R199W 7 Disease relevant genes with no reportable alterations: ALK, BRAF, EGFR, ERBB2, MET, RET, ROS1  PDL 1 expression was 1%  PRIOR THERAPY: SBRT to multiple brain metastasis under the care of Dr. Isidore Moos.  CURRENT THERAPY: Systemic chemotherapy with carboplatin for AUC of 5, Alimta 500 mg/M2 and Keytruda 200 mg IV every 3 weeks.  First dose October 05, 2018.  Status post 7 cycles.  Starting from cycle #5 the patient will be on treatment with maintenance Alimta 500 mg/M2 and Keytruda 200 mg IV every 3 weeks.  INTERVAL HISTORY: Carolyn [name] 64 y.o. female returns to the clinic today for follow-up visit.  The patient is feeling fine today with no concerning complaints except for mild fatigue.  She denied having any current chest pain, shortness of breath, cough or hemoptysis.  She denied having any fever or chills.  She has no nausea, vomiting, diarrhea or constipation.  She continues to tolerate her maintenance treatment with Alimta and Keytruda fairly well.  She is here today for evaluation before starting cycle #8 of her treatment.   MEDICAL HISTORY: Past Medical History:  Diagnosis Date  . Anginal pain ([place])    " with exertion "  . Arthritis    " 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 "  . Asthma    h/o  . Coronary artery disease   . Diabetes mellitus without  complication ([place])    Type II  . GERD (gastroesophageal reflux disease)   . H/O hiatal hernia   . Heart murmur   . History of radiation therapy 09/22/2018   stereotactic radiosurgery   . 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 Hypertension   . nscl ca dx'd 08/07/18  . Persistent headaches    h/o  . Pneumonia    hx of PNA  . Shortness of breath   . Vaginal dryness    h/o    ALLERGIES:  is allergic to lisinopril.  MEDICATIONS:  Current Outpatient Medications  Medication Sig Dispense Refill  . acetaminophen (TYLENOL) 500 MG tablet Take 1,000 mg by mouth every 8 (eight) hours as needed for mild pain, fever or headache.     Marland Kitchen amLODipine (NORVASC) 5 MG tablet Take 5 mg by mouth at bedtime.     Marland Kitchen aspirin EC 81 MG tablet Take 81 mg by mouth daily.    . carvedilol (COREG) 25 MG tablet Take 12.5 mg by mouth 2 (two) times daily with a meal.     . cetirizine (ZYRTEC) 10 MG tablet Take 10 mg by mouth daily.    . Cholecalciferol (VITAMIN D-3) 125 MCG (5000 UT) TABS Take 5,000 Units by mouth daily.    . Chromium Picolinate 1000 MCG TABS Take 1,000 mg by mouth daily.    Marland Kitchen esomeprazole (NEXIUM) 20 MG capsule Take 20 mg by mouth daily at 12 noon.    Marland Kitchen  folic acid (FOLVITE) 1 MG tablet Take 1 mg by mouth daily.    . hydrochlorothiazide (MICROZIDE) 12.5 MG capsule Take 12.5 mg by mouth daily.    Marland Kitchen lidocaine-prilocaine (EMLA) cream Apply to the Port-A-Cath site 30 to 60 minutes before chemotherapy. 30 g 0  . metFORMIN (GLUCOPHAGE) 500 MG tablet Take 500 mg by mouth 2 (two) times daily with a meal.     . methocarbamol (ROBAXIN) 500 MG tablet Take 1 tablet (500 mg total) by mouth every 6 (six) hours as needed for muscle spasms. 21 tablet 0  . ONETOUCH ULTRA test strip USE 1 STRIP TO CHECK GLUCOSE THREE TIMES DAILY    . Pitavastatin Calcium (LIVALO) 4 MG TABS Take 4 mg by mouth at bedtime.     Vladimir Faster Glycol-Propyl Glycol (SYSTANE) 0.4-0.3 % SOLN Place 1 drop into both eyes every 6 (six) hours as needed (dry  eyes).     . psyllium (METAMUCIL) 58.6 % packet Take 1 packet by mouth at bedtime as needed (constipation).     Marland Kitchen telmisartan (MICARDIS) 40 MG tablet Take 40 mg by mouth daily.    Marland Kitchen zolpidem (AMBIEN) 10 MG tablet Take 2.5 mg by mouth at bedtime.     . nitroGLYCERIN (NITROSTAT) 0.4 MG SL tablet Place 1 tablet (0.4 mg total) under the tongue every 5 (five) minutes as needed for chest pain. (Patient not taking: Reported on 03/01/2019) 30 tablet 12  . prochlorperazine (COMPAZINE) 10 MG tablet Take 1 tablet (10 mg total) by mouth every 6 (six) hours as needed for nausea or vomiting. (Patient not taking: Reported on 03/01/2019) 30 tablet 2   No current facility-administered medications for this visit.    SURGICAL HISTORY:  Past Surgical History:  Procedure Laterality Date  . ABDOMINAL HYSTERECTOMY    . bone spur    . CARDIAC CATHETERIZATION  06/13/2012  . carpel tunnel surgery Left   . COLONOSCOPY     9 years ago  . CORONARY ANGIOPLASTY  06/13/2012  . EYE SURGERY     cyst left eye   . IR IMAGING GUIDED PORT INSERTION  10/10/2018  . LEFT HEART CATHETERIZATION WITH CORONARY ANGIOGRAM N/A 06/13/2012   Procedure: LEFT HEART CATHETERIZATION WITH CORONARY ANGIOGRAM;  Surgeon: Laverda Page, MD;  Location: [facility] CATH LAB;  Service: Cardiovascular;  Laterality: N/A;  . NECK SURGERY    . rotator cuff surgery Right 2015  . VIDEO BRONCHOSCOPY WITH ENDOBRONCHIAL NAVIGATION N/A 09/06/2018   Procedure: VIDEO BRONCHOSCOPY WITH ENDOBRONCHIAL NAVIGATION, left upper lung;  Surgeon: Lajuana Matte, MD;  Location: [facility];  Service: Thoracic;  Laterality: N/A;  . VIDEO BRONCHOSCOPY WITH ENDOBRONCHIAL ULTRASOUND Left 09/06/2018   Procedure: VIDEO BRONCHOSCOPY WITH ENDOBRONCHIAL ULTRASOUND, left lung;  Surgeon: Lajuana Matte, MD;  Location: [facility];  Service: Thoracic;  Laterality: Left;  . WISDOM TOOTH EXTRACTION      REVIEW OF SYSTEMS:  A comprehensive review of systems was negative except for:  Constitutional: positive for fatigue   PHYSICAL EXAMINATION: General appearance: alert, cooperative and no distress Head: Normocephalic, without obvious abnormality, atraumatic Neck: no adenopathy, no JVD, supple, symmetrical, trachea midline and thyroid not enlarged, symmetric, no tenderness/mass/nodules Lymph nodes: Cervical, supraclavicular, and axillary nodes normal. Resp: clear to auscultation bilaterally Back: symmetric, no curvature. ROM normal. No CVA tenderness. Cardio: regular rate and rhythm, S1, S2 normal, no murmur, click, rub or gallop GI: soft, non-tender; bowel sounds normal; no masses,  no organomegaly Extremities: extremities normal, atraumatic, no cyanosis or edema  ECOG PERFORMANCE STATUS: 1 - Symptomatic but completely ambulatory  Blood pressure 138/70, pulse 70, temperature 98.7 F (37.1 C), temperature source Temporal, resp. rate 16, height 5' (1.524 m), weight 180 lb 9.6 oz (81.9 kg), SpO2 100 %.  LABORATORY DATA: Lab Results  Component Value Date   WBC 5.8 03/01/2019   HGB 8.8 (L) 03/01/2019   HCT 28.2 (L) 03/01/2019   MCV 85.2 03/01/2019   PLT 295 03/01/2019      Chemistry      Component Value Date/Time   NA 142 02/05/2019 1032   K 3.6 02/05/2019 1032   CL 104 02/05/2019 1032   CO2 28 02/05/2019 1032   BUN 14 02/05/2019 1032   CREATININE 1.29 (H) 02/05/2019 1032      Component Value Date/Time   CALCIUM 9.1 02/05/2019 1032   ALKPHOS 69 02/05/2019 1032   AST 40 02/05/2019 1032   ALT 30 02/05/2019 1032   BILITOT 0.3 02/05/2019 1032       RADIOGRAPHIC STUDIES: CT Chest W Contrast  Result Date: 02/07/2019 CLINICAL DATA:  Adenocarcinoma of left lung. Brain metastasis. Left upper lobe primary. Status post chemotherapy and immunotherapy. EXAM: CT CHEST, ABDOMEN, AND PELVIS WITH CONTRAST TECHNIQUE: Multidetector CT imaging of the chest, abdomen and pelvis was performed following the standard protocol during bolus administration of intravenous  contrast. CONTRAST:  131m OMNIPAQUE IOHEXOL 300 MG/ML  SOLN COMPARISON:  12/05/2018 FINDINGS: CT CHEST FINDINGS Cardiovascular: Right Port-A-Cath tip at high right atrium. Bovine arch. Tortuous thoracic aorta. Mild cardiomegaly, with minimal, likely physiologic pericardial fluid. Multivessel coronary artery atherosclerosis. No central pulmonary embolism, on this non-dedicated study. Mediastinum/Nodes: No supraclavicular adenopathy. No mediastinal or hilar adenopathy. Tiny hiatal hernia. Lungs/Pleura: No pleural fluid. Subpleural right upper lobe pulmonary nodule measures 7 mm on 45/6 and is similar to on the prior exam (when remeasured). Right lower lobe vague subpleural 3 mm pulmonary nodule on 70/6, similar. Spiculated posterior left upper lobe pulmonary nodule measures 9 x 9 mm on 55/6 and is similar to on the prior exam (when remeasured). A vague subpleural anterior left lower lobe 4 mm nodule on 70/6 is unchanged. Musculoskeletal: No acute osseous abnormality. Lower cervical spine fixation. CT ABDOMEN PELVIS FINDINGS Hepatobiliary: Normal liver. Normal gallbladder, without biliary ductal dilatation. Pancreas: Normal, without mass or ductal dilatation. Spleen: Normal in size, without focal abnormality. Adrenals/Urinary Tract: Normal adrenal glands. Development of bilateral heterogeneous renal enhancement, most apparent on delayed images, including 18/7. No hydronephrosis. Too small to characterize left renal lesions. Equivocal edema anterior to the urinary bladder on 101/2. Stomach/Bowel: Normal stomach, without wall thickening. Transverse duodenal diverticulum. Otherwise normal small bowel. Normal terminal ileum and appendix. Vascular/Lymphatic: Advanced aortic and branch vessel atherosclerosis. No abdominopelvic adenopathy. Reproductive: Hysterectomy.  No adnexal mass. Other: No significant free fluid. Mild pelvic floor laxity. No evidence of omental or peritoneal disease. Musculoskeletal: Similar grade 1  L4-5 anterolisthesis, likely degenerative. Lumbosacral spondylosis. IMPRESSION: 1. Similar bilateral pulmonary nodules, including the dominant left upper lobe pulmonary nodule. 2. No thoracic adenopathy. 3. No new or progressive disease. 4. Bilateral heterogeneous renal enhancement, consistent with pyelonephritis. Equivocal pericystic edema which could represent cystitis. 5. Age advanced coronary artery atherosclerosis. Recommend assessment of coronary risk factors and consideration of medical therapy. 6.  Aortic Atherosclerosis (ICD10-I70.0). These results will be called to the ordering clinician or representative by the Radiologist Assistant, and communication documented in the PACS or zVision Dashboard. Electronically Signed   By: KAbigail MiyamotoM.D.   On: 02/07/2019 16:39  CT Abdomen Pelvis W Contrast  Result Date: 02/07/2019 CLINICAL DATA:  Adenocarcinoma of left lung. Brain metastasis. Left upper lobe primary. Status post chemotherapy and immunotherapy. EXAM: CT CHEST, ABDOMEN, AND PELVIS WITH CONTRAST TECHNIQUE: Multidetector CT imaging of the chest, abdomen and pelvis was performed following the standard protocol during bolus administration of intravenous contrast. CONTRAST:  172m OMNIPAQUE IOHEXOL 300 MG/ML  SOLN COMPARISON:  12/05/2018 FINDINGS: CT CHEST FINDINGS Cardiovascular: Right Port-A-Cath tip at high right atrium. Bovine arch. Tortuous thoracic aorta. Mild cardiomegaly, with minimal, likely physiologic pericardial fluid. Multivessel coronary artery atherosclerosis. No central pulmonary embolism, on this non-dedicated study. Mediastinum/Nodes: No supraclavicular adenopathy. No mediastinal or hilar adenopathy. Tiny hiatal hernia. Lungs/Pleura: No pleural fluid. Subpleural right upper lobe pulmonary nodule measures 7 mm on 45/6 and is similar to on the prior exam (when remeasured). Right lower lobe vague subpleural 3 mm pulmonary nodule on 70/6, similar. Spiculated posterior left upper lobe  pulmonary nodule measures 9 x 9 mm on 55/6 and is similar to on the prior exam (when remeasured). A vague subpleural anterior left lower lobe 4 mm nodule on 70/6 is unchanged. Musculoskeletal: No acute osseous abnormality. Lower cervical spine fixation. CT ABDOMEN PELVIS FINDINGS Hepatobiliary: Normal liver. Normal gallbladder, without biliary ductal dilatation. Pancreas: Normal, without mass or ductal dilatation. Spleen: Normal in size, without focal abnormality. Adrenals/Urinary Tract: Normal adrenal glands. Development of bilateral heterogeneous renal enhancement, most apparent on delayed images, including 18/7. No hydronephrosis. Too small to characterize left renal lesions. Equivocal edema anterior to the urinary bladder on 101/2. Stomach/Bowel: Normal stomach, without wall thickening. Transverse duodenal diverticulum. Otherwise normal small bowel. Normal terminal ileum and appendix. Vascular/Lymphatic: Advanced aortic and branch vessel atherosclerosis. No abdominopelvic adenopathy. Reproductive: Hysterectomy.  No adnexal mass. Other: No significant free fluid. Mild pelvic floor laxity. No evidence of omental or peritoneal disease. Musculoskeletal: Similar grade 1 L4-5 anterolisthesis, likely degenerative. Lumbosacral spondylosis. IMPRESSION: 1. Similar bilateral pulmonary nodules, including the dominant left upper lobe pulmonary nodule. 2. No thoracic adenopathy. 3. No new or progressive disease. 4. Bilateral heterogeneous renal enhancement, consistent with pyelonephritis. Equivocal pericystic edema which could represent cystitis. 5. Age advanced coronary artery atherosclerosis. Recommend assessment of coronary risk factors and consideration of medical therapy. 6.  Aortic Atherosclerosis (ICD10-I70.0). These results will be called to the ordering clinician or representative by the Radiologist Assistant, and communication documented in the PACS or zVision Dashboard. Electronically Signed   By: KAbigail MiyamotoM.D.    On: 02/07/2019 16:39    ASSESSMENT AND PLAN: This is a very pleasant 64years old African-American female with likely stage IV (T1c, N1, M1C) lung cancer pending tissue diagnosis and presented with left upper lobe lung nodule in addition to right upper lobe pulmonary nodule as well as left hilar adenopathy and metastatic lesion to the brain. Molecular studies showed no actionable mutation and PDL 1 expression was 1%. The patient underwent SBRT to the metastatic brain lesion under the care of Dr. SIsidore Moos The patient is currently undergoing systemic chemotherapy with carboplatin for AUC of 5, Alimta 500 mg/M2 and Keytruda 200 mg IV every 3 weeks is status post 7 cycles.  The patient has been tolerating this treatment well with no concerning complaints except for fatigue 2 days after her cycle. I recommended for her to proceed with cycle #8 today as planned. I will see her back for follow-up visit in 3 weeks for evaluation before starting cycle #9. For the anemia she was advised to take oral iron tablets at  regular basis. The patient was advised to call immediately if she has any concerning symptoms in the interval. The patient voices understanding of current disease status and treatment options and is in agreement with the current care plan. All questions were answered. The patient knows to call the clinic with any problems, questions or concerns. We can certainly see the patient much sooner if necessary.  Disclaimer: This note was dictated with voice recognition software. Similar sounding words can inadvertently be transcribed and may not be corrected upon review.

## 2019-03-05 ENCOUNTER — Telehealth: Payer: Self-pay | Admitting: Internal Medicine

## 2019-03-05 DIAGNOSIS — E7800 Pure hypercholesterolemia, unspecified: Secondary | ICD-10-CM | POA: Diagnosis not present

## 2019-03-05 DIAGNOSIS — N183 Chronic kidney disease, stage 3 unspecified: Secondary | ICD-10-CM | POA: Diagnosis not present

## 2019-03-05 DIAGNOSIS — E119 Type 2 diabetes mellitus without complications: Secondary | ICD-10-CM | POA: Diagnosis not present

## 2019-03-05 DIAGNOSIS — C3490 Malignant neoplasm of unspecified part of unspecified bronchus or lung: Secondary | ICD-10-CM | POA: Diagnosis not present

## 2019-03-05 DIAGNOSIS — I10 Essential (primary) hypertension: Secondary | ICD-10-CM | POA: Diagnosis not present

## 2019-03-05 NOTE — Telephone Encounter (Signed)
Scheduled per los. Called and left msg. Mailed printout  °

## 2019-03-09 ENCOUNTER — Other Ambulatory Visit: Payer: Self-pay

## 2019-03-09 ENCOUNTER — Ambulatory Visit (INDEPENDENT_AMBULATORY_CARE_PROVIDER_SITE_OTHER): Payer: Medicare Other | Admitting: Cardiology

## 2019-03-09 ENCOUNTER — Encounter: Payer: Self-pay | Admitting: Cardiology

## 2019-03-09 VITALS — BP 120/66 | HR 81 | Temp 98.0°F | Ht 60.0 in | Wt 176.0 lb

## 2019-03-09 DIAGNOSIS — E118 Type 2 diabetes mellitus with unspecified complications: Secondary | ICD-10-CM | POA: Diagnosis not present

## 2019-03-09 DIAGNOSIS — N1831 Chronic kidney disease, stage 3a: Secondary | ICD-10-CM

## 2019-03-09 DIAGNOSIS — I25118 Atherosclerotic heart disease of native coronary artery with other forms of angina pectoris: Secondary | ICD-10-CM

## 2019-03-09 DIAGNOSIS — I129 Hypertensive chronic kidney disease with stage 1 through stage 4 chronic kidney disease, or unspecified chronic kidney disease: Secondary | ICD-10-CM

## 2019-03-09 DIAGNOSIS — I10 Essential (primary) hypertension: Secondary | ICD-10-CM

## 2019-03-09 MED ORDER — NITROGLYCERIN 0.4 MG SL SUBL: 0.4000 mg | SUBLINGUAL_TABLET | SUBLINGUAL | 2 refills | Status: DC | PRN

## 2019-03-09 NOTE — Progress Notes (Signed)
Primary Physician/Referring:  [name], MD  Patient ID: Carolyn [name], female    DOB: [birth year], 64 y.o.   MRN: [id number]  Chief Complaint  Patient presents with  . Hypertension  . Follow-up    1 year   HPI:    HPI: Carolyn [name]  is a 64 y.o. 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  Has had 2 episodes of burning sensation in her chest while she was physically active about 2 to 3 weeks ago that lasted a few minutes and subsided spontaneously.  She has not had any recurrence.  Denies dyspnea, PND or orthopnea.  Past Medical History:  Diagnosis Date  . Anginal pain ([place])    " with exertion "  . Arthritis    " 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 "  . Asthma    h/o  . Coronary artery disease   . Diabetes mellitus without complication ([place])    Type II  . GERD (gastroesophageal reflux disease)   . H/O hiatal hernia   . Heart murmur   . History of radiation therapy 09/22/2018   stereotactic radiosurgery   . 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 Hypertension   . nscl ca dx'd 08/07/18  . Persistent headaches    h/o  . Pneumonia    hx of PNA  . Shortness of breath   . Vaginal dryness    h/o   Past Surgical History:  Procedure Laterality Date  . ABDOMINAL HYSTERECTOMY    . bone spur    . CARDIAC CATHETERIZATION  06/13/2012  . carpel tunnel surgery Left   . COLONOSCOPY     9 years ago  . CORONARY ANGIOPLASTY  06/13/2012  . EYE SURGERY     cyst left eye   . IR IMAGING GUIDED PORT INSERTION  10/10/2018  . LEFT HEART CATHETERIZATION WITH CORONARY ANGIOGRAM N/A 06/13/2012   Procedure: LEFT HEART CATHETERIZATION WITH  CORONARY ANGIOGRAM;  Surgeon: Laverda Page, MD;  Location: [facility] CATH LAB;  Service: Cardiovascular;  Laterality: N/A;  . NECK SURGERY    . rotator cuff surgery Right 2015  . VIDEO BRONCHOSCOPY WITH ENDOBRONCHIAL NAVIGATION N/A 09/06/2018   Procedure: VIDEO BRONCHOSCOPY WITH ENDOBRONCHIAL NAVIGATION, left upper lung;  Surgeon: Lajuana Matte, MD;  Location: [place];  Service: Thoracic;  Laterality: N/A;  . VIDEO BRONCHOSCOPY WITH ENDOBRONCHIAL ULTRASOUND Left 09/06/2018   Procedure: VIDEO BRONCHOSCOPY WITH ENDOBRONCHIAL ULTRASOUND, left lung;  Surgeon: Lajuana Matte, MD;  Location: [place];  Service: Thoracic;  Laterality: Left;  . WISDOM TOOTH EXTRACTION     Social History   Socioeconomic History  . Marital status: Married    Spouse name: Not on file  . Number of children: 3  . Years of education: Not on file  . Highest education level: Not on file  Occupational History  . Not on file  Tobacco Use  . Smoking status: Former Smoker    Packs/day: 0.50    Years: 30.00    Pack years: 15.00    Quit date: 02/09/1999    Years since quitting: 20.0  . Smokeless tobacco: Never  Used  Substance and Sexual Activity  . Alcohol use: No  . Drug use: No  . Sexual activity: Yes    Birth control/protection: Post-menopausal  Other Topics Concern  . Not on file  Social History Narrative  . Not on file   Social Determinants of Health   Financial Resource Strain:   . Difficulty of Paying Living Expenses: Not on file  Food Insecurity:   . Worried About Charity fundraiser in the Last Year: Not on file  . Ran Out of Food in the Last Year: Not on file  Transportation Needs: No Transportation Needs  . Lack of Transportation (Medical): No  . Lack of Transportation (Non-Medical): No  Physical Activity:   . Days of Exercise per Week: Not on file  . Minutes of Exercise per Session: Not on file  Stress:   . Feeling of Stress : Not on file  Social Connections:   . Frequency of  Communication with Friends and Family: Not on file  . Frequency of Social Gatherings with Friends and Family: Not on file  . Attends Religious Services: Not on file  . Active Member of Clubs or Organizations: Not on file  . Attends Archivist Meetings: Not on file  . Marital Status: Not on file  Intimate Partner Violence: Not At Risk  . Fear of Current or Ex-Partner: No  . Emotionally Abused: No  . Physically Abused: No  . Sexually Abused: No   ROS  Review of Systems  Constitution: Negative for weight gain.  Cardiovascular: Negative for dyspnea on exertion, leg swelling and syncope.  Respiratory: Negative for hemoptysis.   Endocrine: Negative for cold intolerance.  Hematologic/Lymphatic: Does not bruise/bleed easily.  Musculoskeletal: Positive for arthritis.  Gastrointestinal: Negative for hematochezia and melena.  Neurological: Negative for headaches and light-headedness.   Objective  Blood pressure 120/66, pulse 81, temperature 98 F (36.7 C), height 5' (1.524 m), weight 176 lb (79.8 kg), SpO2 97 %. Body mass index is 34.37 kg/m.   Vitals with BMI 03/09/2019 03/01/2019 02/08/2019  Height 5\' 0"  5\' 0"  5\' 0"   Weight 176 lbs 180 lbs 10 oz 179 lbs 8 oz  BMI 34.37 88.41 66.06  Systolic 301 601 093  Diastolic 66 70 54  Pulse 81 70 72    Physical Exam  Constitutional: She is oriented to person, place, and time. Vital signs are normal.  Moderately built and mildly obese  HENT:  Head: Normocephalic and atraumatic.  Cardiovascular: Normal rate, regular rhythm, normal heart sounds and intact distal pulses.  No JVD, No leg edema  Pulmonary/Chest: Effort normal and breath sounds normal. No accessory muscle usage. No respiratory distress.  Abdominal: Soft. Bowel sounds are normal.  Musculoskeletal:        General: Normal range of motion.     Cervical back: Normal range of motion.  Neurological: She is alert and oriented to person, place, and time.  Skin: Skin is dry.   Vitals reviewed.  Radiology: No results found.  Laboratory examination:   Recent Labs    01/18/19 0900 02/05/19 1032 03/01/19 0809  NA 144 142 142  K 3.8 3.6 3.4*  CL 103 104 104  CO2 30 28 27   GLUCOSE 98 87 117*  BUN 10 14 11   CREATININE 1.16* 1.29* 1.26*  CALCIUM 9.1 9.1 8.7*  GFRNONAA 49* 43* 45*  GFRAA 57* 50* 52*   CMP Latest Ref Rng & Units 03/01/2019 02/05/2019 01/18/2019  Glucose 70 - 99 mg/dL 117(H) 87  98  BUN 8 - 23 mg/dL 11 14 10   Creatinine 0.44 - 1.00 mg/dL 1.26(H) 1.29(H) 1.16(H)  Sodium 135 - 145 mmol/L 142 142 144  Potassium 3.5 - 5.1 mmol/L 3.4(L) 3.6 3.8  Chloride 98 - 111 mmol/L 104 104 103  CO2 22 - 32 mmol/L 27 28 30   Calcium 8.9 - 10.3 mg/dL 8.7(L) 9.1 9.1  Total Protein 6.5 - 8.1 g/dL 7.4 7.4 7.4  Total Bilirubin 0.3 - 1.2 mg/dL 0.3 0.3 0.4  Alkaline Phos 38 - 126 U/L 67 69 75  AST 15 - 41 U/L 37 40 44(H)  ALT 0 - 44 U/L 24 30 32   CBC Latest Ref Rng & Units 03/01/2019 02/05/2019 01/18/2019  WBC 4.0 - 10.5 K/uL 5.8 5.2 6.3  Hemoglobin 12.0 - 15.0 g/dL 8.8(L) 9.0(L) 9.6(L)  Hematocrit 36.0 - 46.0 % 28.2(L) 29.3(L) 31.2(L)  Platelets 150 - 400 K/uL 295 229 332   Lipid Panel  No results found for: CHOL, TRIG, HDL, CHOLHDL, VLDL, LDLCALC, LDLDIRECT HEMOGLOBIN A1C Lab Results  Component Value Date   HGBA1C 6.6 (H) 09/04/2018   MPG 142.72 09/04/2018   TSH Recent Labs    01/18/19 0901 02/05/19 1032 03/01/19 0809  TSH 1.627 1.377 3.136   External Labs 02/20/2018 Cholesterol, total 144.000 M 02/20/2018 HDL 63.000 02/26/2019 LDL N/D Triglycerides 48.000 08/25/2018  A1C 6.600 09/04/2018 Hemoglobin 8.800 03/01/2019. Platelets 295.000 03/01/2019 Creatinine, Serum 1.260 03/01/2019 Potassium 3.400 03/01/2019 Magnesium N/D ALT (SGPT) 24.000 03/01/2019 TSH 3.136 03/01/2019 INR 1.000 10/10/2018   Medications   Current Outpatient Medications  Medication Instructions  . acetaminophen (TYLENOL) 1,000 mg, Oral, Every 8 hours PRN  . amLODipine  (NORVASC) 5 mg, Oral, Daily at bedtime  . aspirin EC 81 mg, Daily  . carvedilol (COREG) 12.5 mg, Oral, 2 times daily with meals  . cetirizine (ZYRTEC) 10 mg, Daily  . Chromium Picolinate 1,000 mg, Oral, Daily  . esomeprazole (NEXIUM) 20 mg, Oral, Daily  . folic acid (FOLVITE) 1 mg, Oral, Daily  . glimepiride (AMARYL) 0.5 mg, Oral, Daily PRN  . hydrochlorothiazide (MICROZIDE) 12.5 mg, Oral, Daily  . lidocaine-prilocaine (EMLA) cream Apply to the Port-A-Cath site 30 to 60 minutes before chemotherapy.  . Livalo 4 mg, Oral, Daily at bedtime  . metFORMIN (GLUCOPHAGE) 500 mg, Oral, 2 times daily with meals  . methocarbamol (ROBAXIN) 500 mg, Oral, Every 6 hours PRN  . nitroGLYCERIN (NITROSTAT) 0.4 mg, Sublingual, Every 5 min PRN  . ONETOUCH ULTRA test strip USE 1 STRIP TO CHECK GLUCOSE THREE TIMES DAILY  . Polyethyl Glycol-Propyl Glycol (SYSTANE) 0.4-0.3 % SOLN 1 drop, Both Eyes, Every 6 hours PRN  . prochlorperazine (COMPAZINE) 10 mg, Oral, Every 6 hours PRN  . psyllium (METAMUCIL) 58.6 % packet 1 packet, Oral, At bedtime PRN  . telmisartan (MICARDIS) 40 mg, Oral, Daily  . Vitamin D-3 5,000 Units, Oral, Daily  . zolpidem (AMBIEN) 2.5 mg, Oral, Daily at bedtime    Cardiac Studies:   cardiac catheterization on 06/13/2012: High-grade 99% mid LAD stenosis, successful PTCA and stenting with 4.0 x 24 mm Veriflex non drug eluting stent.  Assessment     ICD-10-CM   1. Atherosclerosis of native coronary artery of native heart with stable angina pectoris (HCC)  I25.118 nitroGLYCERIN (NITROSTAT) 0.4 MG SL tablet  2. Primary hypertension  I10 EKG 12-Lead  3. Controlled type 2 diabetes mellitus with complication, without long-term current use of insulin (HCC)  E11.8   4. Stage 3a chronic kidney disease  N18.31  EKG 03/09/2019: Normal sinus rhythm with rate of 60 bpm, normal axis.  Nonspecific T wave flattening.  Low-voltage complexes, pulmonary disease pattern. No significant change from  EKG  01/18/2018.  Recommendations:    Carolyn [name] is a 64 y.o.  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   Patient has had 2 episode of what appears to be anginal episodes but no recurrence 2 weeks ago.  I simply reassured her.  No change in her physical exam or EKG.  Blood pressure is well controlled.  Diabetes mellitus with stage III chronic kidney disease is now much improved and lipids are controlled, external labs reviewed. I will see her back in a year. NTG prescribed.   Adrian Prows, MD, [facility] 03/09/2019, 12:24 PM [facility]

## 2019-03-14 ENCOUNTER — Other Ambulatory Visit: Payer: Medicare Other

## 2019-03-14 ENCOUNTER — Ambulatory Visit: Payer: Medicare Other

## 2019-03-14 ENCOUNTER — Ambulatory Visit: Payer: Medicare Other | Admitting: Internal Medicine

## 2019-03-20 ENCOUNTER — Other Ambulatory Visit: Payer: Self-pay

## 2019-03-20 ENCOUNTER — Other Ambulatory Visit: Payer: Self-pay | Admitting: Radiation Therapy

## 2019-03-20 ENCOUNTER — Ambulatory Visit
Admission: RE | Admit: 2019-03-20 | Discharge: 2019-03-20 | Disposition: A | Discharge status: Home / Self Care | Payer: Medicare Other | Source: Ambulatory Visit | Attending: Radiation Oncology | Admitting: Radiation Oncology

## 2019-03-20 ENCOUNTER — Encounter: Payer: Self-pay | Admitting: Radiation Therapy

## 2019-03-20 DIAGNOSIS — C7931 Secondary malignant neoplasm of brain: Secondary | ICD-10-CM

## 2019-03-20 DIAGNOSIS — C7949 Secondary malignant neoplasm of other parts of nervous system: Secondary | ICD-10-CM

## 2019-03-20 MED ORDER — SODIUM CHLORIDE 0.9% FLUSH: 10.0000 mL | INTRAVENOUS | Status: DC | PRN

## 2019-03-20 MED ORDER — HEPARIN SOD (PORK) LOCK FLUSH 100 UNIT/ML IV SOLN: 500.0000 [IU] | Freq: Once | INTRAVENOUS | Status: DC

## 2019-03-20 NOTE — Progress Notes (Signed)
Order placed on 2/9 for port access with flush at [facility] for upcoming brain MRI.

## 2019-03-21 NOTE — Progress Notes (Signed)
[place] OFFICE PROGRESS NOTE  Carolyn Gravel, MD [address] [place] [address]  DIAGNOSIS: Stage IV(T1c, N1, M1 C)non-small cell lung cancer, adenocarcinoma. Shepresented with left upper lobe lung nodule in addition to right upper lobe lung nodule with left hilar lymphadenopathy as well as multiple brain metastasis.She was diagnosed in July of 2020  Biomarker Findings Microsatellite status - MS-Stable Tumor Mutational Burden - 4 Muts/Mb Genomic Findings For a complete list of the genes assayed, please refer to the Appendix. ATM G204* KRAS G12C PDGFRA P122T SMO R199W 7 Disease relevant genes with no reportable alterations: ALK, BRAF, EGFR, ERBB2, MET, RET, ROS1  PDL 1 expressionwas 1%  PRIOR THERAPY: SBRT to multiple brain metastasis under the care of Dr. Isidore Moos.Completed in August 2020.  CURRENT THERAPY: Systemic chemotherapy with Carboplatin for an AUC of 5, Alimta 500 mg/m2, and Keytruda 200 mg IV every 3 weeks. Status post8cycles of treatment. First dose on 10/05/2018.  INTERVAL HISTORY: Carolyn [name] 64 y.o. [name] returns to the clinic for a follow up visit. The patient is feeling fair today. She is reporting occasional mild body aches but nothing that cannot be controlled with tylenol. She denies any fever, chills, night sweats, or weight loss. Denies any chest pain, shortness of breath, cough, or hemoptysis. She reports occasional nausea without vomiting. She still is reporting dark urine but denies dysuria, cloudy urine, or back pain. She has some baseline constipation secondary to her iron tablets for which she uses stool softners. Denies any headache or visual changes. She follows closely with radiation oncology for her history of metastatic brain lesions. Denies any rashes or skin changes. The patient is here today for evaluation prior to starting cycle # 9.   MEDICAL HISTORY: Past Medical History:  Diagnosis Date  . Anginal  pain ([place])    " with exertion "  . Arthritis    " 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 "  . Asthma    h/o  . Coronary artery disease   . Diabetes mellitus without complication ([place])    Type II  . GERD (gastroesophageal reflux disease)   . H/O hiatal hernia   . Heart murmur   . History of radiation therapy 09/22/2018   stereotactic radiosurgery   . HPV in [name]    h/o  . Hypertension   . nscl ca dx'd 08/07/18  . Persistent headaches    h/o  . Pneumonia    hx of PNA  . Shortness of breath   . Vaginal dryness    h/o    ALLERGIES:  is allergic to lisinopril.  MEDICATIONS:  Current Outpatient Medications  Medication Sig Dispense Refill  . acetaminophen (TYLENOL) 500 MG tablet Take 1,000 mg by mouth every 8 (eight) hours as needed for mild pain, fever or headache.     Marland Kitchen amLODipine (NORVASC) 5 MG tablet Take 5 mg by mouth at bedtime.     Marland Kitchen aspirin EC 81 MG tablet Take 81 mg by mouth daily.    . carvedilol (COREG) 25 MG tablet Take 12.5 mg by mouth 2 (two) times daily with a meal.     . cetirizine (ZYRTEC) 10 MG tablet Take 10 mg by mouth daily.    . Cholecalciferol (VITAMIN D-3) 125 MCG (5000 UT) TABS Take 5,000 Units by mouth daily.    . Chromium Picolinate 1000 MCG TABS Take 1,000 mg by mouth daily.    Marland Kitchen esomeprazole (NEXIUM) 20 MG capsule Take 20 mg by mouth daily at  12 noon.    . folic acid (FOLVITE) 1 MG tablet Take 1 tablet (1 mg total) by mouth daily. 30 tablet 2  . glimepiride (AMARYL) 1 MG tablet Take 0.5 mg by mouth daily as needed.    . hydrochlorothiazide (MICROZIDE) 12.5 MG capsule Take 12.5 mg by mouth daily.    Marland Kitchen lidocaine-prilocaine (EMLA) cream Apply to the Port-A-Cath site 30 to 60 minutes before chemotherapy. 30 g 2  . metFORMIN (GLUCOPHAGE) 500 MG tablet Take 500 mg by mouth 2 (two) times daily with a meal.     . methocarbamol (ROBAXIN) 500 MG tablet Take 1 tablet (500 mg total) by mouth every 6 (six) hours as needed for muscle spasms. 21 tablet 0  . ONETOUCH ULTRA test strip USE 1  STRIP TO CHECK GLUCOSE THREE TIMES DAILY    . Pitavastatin Calcium (LIVALO) 4 MG TABS Take 4 mg by mouth at bedtime.     Vladimir Faster Glycol-Propyl Glycol (SYSTANE) 0.4-0.3 % SOLN Place 1 drop into both eyes every 6 (six) hours as needed (dry eyes).     . prochlorperazine (COMPAZINE) 10 MG tablet Take 1 tablet (10 mg total) by mouth every 6 (six) hours as needed for nausea or vomiting. 30 tablet 2  . psyllium (METAMUCIL) 58.6 % packet Take 1 packet by mouth at bedtime as needed (constipation).     Marland Kitchen telmisartan (MICARDIS) 40 MG tablet Take 40 mg by mouth daily.    Marland Kitchen zolpidem (AMBIEN) 10 MG tablet Take 2.5 mg by mouth at bedtime.     . nitroGLYCERIN (NITROSTAT) 0.4 MG SL tablet Place 1 tablet (0.4 mg total) under the tongue every 5 (five) minutes as needed for chest pain. (Patient not taking: Reported on 03/22/2019) 25 tablet 2   No current facility-administered medications for this visit.   Facility-Administered Medications Ordered in Other Visits  Medication Dose Route Frequency Provider Last Rate Last Admin  . sodium chloride flush (NS) 0.9 % injection 10 mL  10 mL Intracatheter PRN Curt Bears, MD   10 mL at 03/22/19 1322    SURGICAL HISTORY:  Past Surgical History:  Procedure Laterality Date  . ABDOMINAL HYSTERECTOMY    . bone spur    . CARDIAC CATHETERIZATION  06/13/2012  . carpel tunnel surgery Left   . COLONOSCOPY     9 years ago  . CORONARY ANGIOPLASTY  06/13/2012  . EYE SURGERY     cyst left eye   . IR IMAGING GUIDED PORT INSERTION  10/10/2018  . LEFT HEART CATHETERIZATION WITH CORONARY ANGIOGRAM N/A 06/13/2012   Procedure: LEFT HEART CATHETERIZATION WITH CORONARY ANGIOGRAM;  Surgeon: Laverda Page, MD;  Location: [facility] CATH LAB;  Service: Cardiovascular;  Laterality: N/A;  . NECK SURGERY    . rotator cuff surgery Right 2015  . VIDEO BRONCHOSCOPY WITH ENDOBRONCHIAL NAVIGATION N/A 09/06/2018   Procedure: VIDEO BRONCHOSCOPY WITH ENDOBRONCHIAL NAVIGATION, left upper lung;   Surgeon: Lajuana Matte, MD;  Location: [place];  Service: Thoracic;  Laterality: N/A;  . VIDEO BRONCHOSCOPY WITH ENDOBRONCHIAL ULTRASOUND Left 09/06/2018   Procedure: VIDEO BRONCHOSCOPY WITH ENDOBRONCHIAL ULTRASOUND, left lung;  Surgeon: Lajuana Matte, MD;  Location: [place];  Service: Thoracic;  Laterality: Left;  . WISDOM TOOTH EXTRACTION      REVIEW OF SYSTEMS:   Review of Systems  Constitutional: Negative for appetite change, chills, fatigue, fever and unexpected weight change.  HENT: Negative for mouth sores, nosebleeds, sore throat and trouble swallowing.  Eyes: Negative for eye problems and  icterus.  Respiratory: Negative for cough, hemoptysis, shortness of breath and wheezing.   Cardiovascular: Negative for chest pain and leg swelling.  Gastrointestinal: Positive for baseline occasional constipation. Positive for occassional nausea. Negative for abdominal pain, diarrhea, and vomiting.  Genitourinary: Positive for urine reportedly darker in color. Negative for bladder incontinence, difficulty urinating, dysuria, frequency and hematuria.   Musculoskeletal: Negative for back pain, gait problem, neck pain and neck stiffness.  Skin: Negative for itching and rash.  Neurological: Negative for dizziness, extremity weakness, gait problem, headaches, light-headedness and seizures.  Hematological: Negative for adenopathy. Does not bruise/bleed easily.  Psychiatric/Behavioral: Negative for confusion, depression and sleep disturbance. The patient is not nervous/anxious.     PHYSICAL EXAMINATION:  Blood pressure (!) 140/58, pulse 64, temperature 98.3 F (36.8 C), temperature source Temporal, resp. rate 18, height 5' (1.524 m), weight 177 lb 4.8 oz (80.4 kg), SpO2 100 %.  ECOG PERFORMANCE STATUS: 1 - Symptomatic but completely ambulatory  Physical Exam  Constitutional: Oriented to person, place, and time and well-developed, well-nourished, and in no distress.  HENT:  Head:  Normocephalic and atraumatic.  Mouth/Throat: Oropharynx is clear and moist. No oropharyngeal exudate.  Eyes: Conjunctivae are normal. Right eye exhibits no discharge. Left eye exhibits no discharge. No scleral icterus.  Neck: Normal range of motion. Neck supple.  Cardiovascular: Normal rate, regular rhythm, normal heart sounds and intact distal pulses.   Pulmonary/Chest: Effort normal and breath sounds normal. No respiratory distress. No wheezes. No rales.  Abdominal: Soft. Bowel sounds are normal. Exhibits no distension and no mass. There is no tenderness.  Musculoskeletal: Normal range of motion. Exhibits no edema.  Lymphadenopathy:    No cervical adenopathy.  Neurological: Alert and oriented to person, place, and time. Exhibits normal muscle tone. Gait normal. Coordination normal.  Skin: Skin is warm and dry. No rash noted. Not diaphoretic. No erythema. No pallor.  Psychiatric: Mood, memory and judgment normal.  Vitals reviewed.  LABORATORY DATA: Lab Results  Component Value Date   WBC 5.5 03/22/2019   HGB 9.2 (L) 03/22/2019   HCT 29.8 (L) 03/22/2019   MCV 86.1 03/22/2019   PLT 343 03/22/2019      Chemistry      Component Value Date/Time   NA 142 03/22/2019 1038   K 3.7 03/22/2019 1038   CL 103 03/22/2019 1038   CO2 29 03/22/2019 1038   BUN 10 03/22/2019 1038   CREATININE 1.51 (H) 03/22/2019 1038      Component Value Date/Time   CALCIUM 9.3 03/22/2019 1038   ALKPHOS 73 03/22/2019 1038   AST 36 03/22/2019 1038   ALT 24 03/22/2019 1038   BILITOT 0.3 03/22/2019 1038       RADIOGRAPHIC STUDIES:  No results found.   ASSESSMENT/PLAN:  This is a very pleasant 64 year old African-American [name] with stage IV (T1c, N1,M1c)lung cancer. She presented with a dominant left upper lobe lung nodule in addition to a right upper lobe pulmonary nodule with left hilar lymphadenopathy. She also has metastatic disease to the brain.She has no actionable mutations and her PDL1  expression is 1%. She was diagnosed in July 2020.  The patient completed SBRT to the metastatic brain lesion under the care of Dr. Isidore Moos in August 2020.   The patient is currently undergoing systemic chemotherapy withcarboplatin for an AUC of 5, Alimta 500 mg/m, and Keytruda 200 mg IV every 3 weeks. She is status post8 cyclesof treatment. Starting from cycle #5, she has been on maintenance Alimta 500 mg/m2 and  Keytruda 200 mg IV. She tolerated her treatment well without any concerning adverse side effects.   Labs were reviewed. Her creatinine is elevated today. She will not receive alimta today. She will proceed with cycle #9 with Keytruda only. I will arrange for a UA to be performed today as well.   I will arrange for the patient to have a restaging CT scan performed prior to her next cycle of chemotherapy. Labs will be performed the day of her scan to assess whether she will perform her scan with or without contrast.   We will see the patient back for follow-up visit in 3 weeks for evaluation and to review her scan results before starting cycle #10.  I sent a refill of folic acid, compazine, and EMLA cream.   The patient was advised to call immediately if she has any concerning symptoms in the interval. The patient voices understanding of current disease status and treatment options and is in agreement with the current care plan. All questions were answered. The patient knows to call the clinic with any problems, questions or concerns. We can certainly see the patient much sooner if necessary  Orders Placed This Encounter  Procedures  . CT Chest W Contrast    Standing Status:   Future    Standing Expiration Date:   03/21/2020    Order Specific Question:   ** REASON FOR EXAM (FREE TEXT)    Answer:   Restaging Lung Cancer    Order Specific Question:   If indicated for the ordered procedure, I authorize the administration of contrast media per Radiology protocol    Answer:   Yes     Order Specific Question:   Preferred imaging location?    Answer:   [facility]    Order Specific Question:   Radiology Contrast Protocol - do NOT remove file path    Answer:   [email or web address]  . CT Abdomen Pelvis W Contrast    Standing Status:   Future    Standing Expiration Date:   03/21/2020    Order Specific Question:   ** REASON FOR EXAM (FREE TEXT)    Answer:   Restaging Lung Cancer    Order Specific Question:   If indicated for the ordered procedure, I authorize the administration of contrast media per Radiology protocol    Answer:   Yes    Order Specific Question:   Preferred imaging location?    Answer:   [facility]    Order Specific Question:   Is Oral Contrast requested for this exam?    Answer:   Yes, Per Radiology protocol    Order Specific Question:   Radiology Contrast Protocol - do NOT remove file path    Answer:   [email or web address]  . Urinalysis, Complete w Microscopic    Standing Status:   Future    Number of Occurrences:   1    Standing Expiration Date:   03/21/2020     Croy Drumwright L Cashus Halterman, PA-C 03/22/19

## 2019-03-22 ENCOUNTER — Inpatient Hospital Stay: Payer: Medicare Other

## 2019-03-22 ENCOUNTER — Inpatient Hospital Stay: Payer: Medicare Other | Attending: Physician Assistant | Admitting: Physician Assistant

## 2019-03-22 ENCOUNTER — Telehealth: Payer: Self-pay | Admitting: *Deleted

## 2019-03-22 ENCOUNTER — Other Ambulatory Visit: Payer: Self-pay

## 2019-03-22 ENCOUNTER — Encounter: Payer: Self-pay | Admitting: Physician Assistant

## 2019-03-22 VITALS — BP 140/58 | HR 64 | Temp 98.3°F | Resp 18 | Ht 60.0 in | Wt 177.3 lb

## 2019-03-22 DIAGNOSIS — Z5112 Encounter for antineoplastic immunotherapy: Secondary | ICD-10-CM | POA: Insufficient documentation

## 2019-03-22 DIAGNOSIS — C3490 Malignant neoplasm of unspecified part of unspecified bronchus or lung: Secondary | ICD-10-CM

## 2019-03-22 DIAGNOSIS — C3492 Malignant neoplasm of unspecified part of left bronchus or lung: Secondary | ICD-10-CM

## 2019-03-22 DIAGNOSIS — Z79899 Other long term (current) drug therapy: Secondary | ICD-10-CM | POA: Insufficient documentation

## 2019-03-22 DIAGNOSIS — C7931 Secondary malignant neoplasm of brain: Secondary | ICD-10-CM | POA: Insufficient documentation

## 2019-03-22 DIAGNOSIS — R5382 Chronic fatigue, unspecified: Secondary | ICD-10-CM

## 2019-03-22 DIAGNOSIS — C3412 Malignant neoplasm of upper lobe, left bronchus or lung: Secondary | ICD-10-CM | POA: Diagnosis not present

## 2019-03-22 DIAGNOSIS — Z95828 Presence of other vascular implants and grafts: Secondary | ICD-10-CM

## 2019-03-22 LAB — CMP (CANCER CENTER ONLY)
ALT: 24 U/L (ref 0–44)
AST: 36 U/L (ref 15–41)
Albumin: 3.6 g/dL (ref 3.5–5.0)
Alkaline Phosphatase: 73 U/L (ref 38–126)
Anion gap: 10 (ref 5–15)
BUN: 10 mg/dL (ref 8–23)
CO2: 29 mmol/L (ref 22–32)
Calcium: 9.3 mg/dL (ref 8.9–10.3)
Chloride: 103 mmol/L (ref 98–111)
Creatinine: 1.51 mg/dL — ABNORMAL HIGH (ref 0.44–1.00)
GFR, Est AFR Am: 42 mL/min — ABNORMAL LOW (ref >60)
GFR, Estimated: 36 mL/min — ABNORMAL LOW (ref >60)
Glucose, Bld: 93 mg/dL (ref 70–99)
Potassium: 3.7 mmol/L (ref 3.5–5.1)
Sodium: 142 mmol/L (ref 135–145)
Total Bilirubin: 0.3 mg/dL (ref 0.3–1.2)
Total Protein: 7.5 g/dL (ref 6.5–8.1)

## 2019-03-22 LAB — URINALYSIS, COMPLETE (UACMP) WITH MICROSCOPIC
Bilirubin Urine: NEGATIVE
Glucose, UA: NEGATIVE mg/dL
Hgb urine dipstick: NEGATIVE
Ketones, ur: NEGATIVE mg/dL
Nitrite: NEGATIVE
Protein, ur: NEGATIVE mg/dL
Specific Gravity, Urine: 1.004 — ABNORMAL LOW (ref 1.005–1.030)
pH: 6 (ref 5.0–8.0)

## 2019-03-22 LAB — CBC WITH DIFFERENTIAL (CANCER CENTER ONLY)
Abs Immature Granulocytes: 0.02 10*3/uL (ref 0.00–0.07)
Basophils Absolute: 0 10*3/uL (ref 0.0–0.1)
Basophils Relative: 0 %
Eosinophils Absolute: 0.2 10*3/uL (ref 0.0–0.5)
Eosinophils Relative: 4 %
HCT: 29.8 % — ABNORMAL LOW (ref 36.0–46.0)
Hemoglobin: 9.2 g/dL — ABNORMAL LOW (ref 12.0–15.0)
Immature Granulocytes: 0 %
Lymphocytes Relative: 23 %
Lymphs Abs: 1.2 10*3/uL (ref 0.7–4.0)
MCH: 26.6 pg (ref 26.0–34.0)
MCHC: 30.9 g/dL (ref 30.0–36.0)
MCV: 86.1 fL (ref 80.0–100.0)
Monocytes Absolute: 0.9 10*3/uL (ref 0.1–1.0)
Monocytes Relative: 17 %
Neutro Abs: 3.1 10*3/uL (ref 1.7–7.7)
Neutrophils Relative %: 56 %
Platelet Count: 343 10*3/uL (ref 150–400)
RBC: 3.46 MIL/uL — ABNORMAL LOW (ref 3.87–5.11)
RDW: 17.1 % — ABNORMAL HIGH (ref 11.5–15.5)
WBC Count: 5.5 10*3/uL (ref 4.0–10.5)
nRBC: 0 % (ref 0.0–0.2)

## 2019-03-22 LAB — TSH: TSH: 1.677 u[IU]/mL (ref 0.308–3.960)

## 2019-03-22 MED ORDER — CYANOCOBALAMIN 1000 MCG/ML IJ SOLN: 1000.0000 ug | Freq: Once | INTRAMUSCULAR | Status: DC

## 2019-03-22 MED ORDER — HEPARIN SOD (PORK) LOCK FLUSH 100 UNIT/ML IV SOLN
500.0000 [IU] | Freq: Once | INTRAVENOUS | Status: AC | PRN
  Administered 2019-03-22: 13:00:00 500 [IU]
  Filled 2019-03-22: qty 5

## 2019-03-22 MED ORDER — SODIUM CHLORIDE 0.9 % IV SOLN
200.0000 mg | Freq: Once | INTRAVENOUS | Status: AC
  Administered 2019-03-22: 200 mg via INTRAVENOUS
  Filled 2019-03-22: qty 8

## 2019-03-22 MED ORDER — CYANOCOBALAMIN 1000 MCG/ML IJ SOLN
INTRAMUSCULAR | Status: AC
  Filled 2019-03-22: qty 1

## 2019-03-22 MED ORDER — SODIUM CHLORIDE 0.9 % IV SOLN
Freq: Once | INTRAVENOUS | Status: AC
  Filled 2019-03-22: qty 250

## 2019-03-22 MED ORDER — SODIUM CHLORIDE 0.9% FLUSH
10.0000 mL | INTRAVENOUS | Status: DC | PRN
  Administered 2019-03-22: 10 mL
  Filled 2019-03-22: qty 10

## 2019-03-22 MED ORDER — FOLIC ACID 1 MG PO TABS: 1.0000 mg | ORAL_TABLET | Freq: Every day | ORAL | 2 refills | Status: DC

## 2019-03-22 MED ORDER — LIDOCAINE-PRILOCAINE 2.5-2.5 % EX CREA: TOPICAL_CREAM | CUTANEOUS | 2 refills | Status: DC

## 2019-03-22 MED ORDER — PROCHLORPERAZINE MALEATE 10 MG PO TABS: 10.0000 mg | ORAL_TABLET | Freq: Four times a day (QID) | ORAL | 2 refills | Status: DC | PRN

## 2019-03-22 NOTE — Progress Notes (Signed)
Per Cassandra Heilingoetter, PA, patient to only get pembrolizumab today.  Ok given to hold compazine as premed if patient is not nausea. Upon assessment, patient is not experiencing nausea and wishes to hold the compazine for today's treatment.

## 2019-03-22 NOTE — Progress Notes (Signed)
No pemetrexed today per Cassandra Heilingoetter, PA-C. Treatment plan updated to reflect changes for today's treatment.   Leron Croak, PharmD, BCPS PGY2 Hematology/Oncology Pharmacy Resident 03/22/2019 11:51 AM

## 2019-03-22 NOTE — Patient Instructions (Signed)
[facility] Discharge Instructions for Patients Receiving Chemotherapy  Today you received the following chemotherapy agents: immunotherapy Keytruda  To help prevent nausea and vomiting after your treatment, we encourage you to take your nausea medication as prescribed.    If you develop nausea and vomiting that is not controlled by your nausea medication, call the clinic.   BELOW ARE SYMPTOMS THAT SHOULD BE REPORTED IMMEDIATELY:  *FEVER GREATER THAN 100.5 F  *CHILLS WITH OR WITHOUT FEVER  NAUSEA AND VOMITING THAT IS NOT CONTROLLED WITH YOUR NAUSEA MEDICATION  *UNUSUAL SHORTNESS OF BREATH  *UNUSUAL BRUISING OR BLEEDING  TENDERNESS IN MOUTH AND THROAT WITH OR WITHOUT PRESENCE OF ULCERS  *URINARY PROBLEMS  *BOWEL PROBLEMS  UNUSUAL RASH Items with * indicate a potential emergency and should be followed up as soon as possible.  Feel free to call the clinic should you have any questions or concerns. The clinic phone number is [phone].  Please show the [facility] at check-in to the Emergency Department and triage nurse.

## 2019-03-22 NOTE — Progress Notes (Signed)
Keytruda ONLY today 03/22/2019.  Infusion suite will attempt to get urine specimen for processing.

## 2019-03-22 NOTE — Telephone Encounter (Signed)
Called patient to relay results from urinalysis.  Per Cassie no signs of infection.

## 2019-03-23 ENCOUNTER — Encounter: Payer: Self-pay | Admitting: General Practice

## 2019-03-23 NOTE — Progress Notes (Signed)
[facility] Spiritual Care Note  Referred by Cassie Heilingoetter/PA for emotional support, as Ms Roppolo was having a down day at her appointment. Ms Shambaugh was very welcoming of pastoral call and shared about how her faith keeps her buoyed up almost all the time. Normalized ups and downs in life and particularly in coping with cancer. She noted that she's feeling more upbeat again today, has cooked for the weekend already, and looks forward to relaxing. We laughed together several times. Faith and prayer are huge coping tools for Ms Senkbeil--and a big part of her extended family's meaning-making. She sees her children (and usually 2/3 grands) most weeks, which is another source of meaning and encouragement.  We plan to meet in person at her next treatment (3/4) for further conversation about faith, coping, and meaning-making.  [place], [place], [facility] Pager [phone] Voicemail [phone]

## 2019-03-26 ENCOUNTER — Other Ambulatory Visit: Payer: Self-pay | Admitting: Radiation Therapy

## 2019-03-26 DIAGNOSIS — C7949 Secondary malignant neoplasm of other parts of nervous system: Secondary | ICD-10-CM

## 2019-03-26 DIAGNOSIS — C7931 Secondary malignant neoplasm of brain: Secondary | ICD-10-CM

## 2019-03-26 MED ORDER — SODIUM CHLORIDE 0.9% FLUSH: 10.0000 mL | INTRAVENOUS | Status: DC | PRN

## 2019-03-26 MED ORDER — HEPARIN SOD (PORK) LOCK FLUSH 100 UNIT/ML IV SOLN: 500.0000 [IU] | Freq: Once | INTRAVENOUS | Status: DC

## 2019-03-26 NOTE — Addendum Note (Signed)
Addended by: Pincus Large on: 03/26/2019 12:44 PM   Modules accepted: Miquel Dunn

## 2019-03-26 NOTE — Progress Notes (Signed)
Opened in error

## 2019-04-04 ENCOUNTER — Ambulatory Visit: Payer: Medicare Other

## 2019-04-04 ENCOUNTER — Ambulatory Visit: Payer: Medicare Other | Admitting: Physician Assistant

## 2019-04-04 ENCOUNTER — Other Ambulatory Visit: Payer: Medicare Other

## 2019-04-05 ENCOUNTER — Inpatient Hospital Stay: Admission: RE | Admit: 2019-04-05 | Payer: Medicare Other | Source: Ambulatory Visit

## 2019-04-05 ENCOUNTER — Other Ambulatory Visit: Payer: Self-pay

## 2019-04-05 ENCOUNTER — Ambulatory Visit
Admission: RE | Admit: 2019-04-05 | Discharge: 2019-04-05 | Disposition: A | Discharge status: Home / Self Care | Payer: Medicare Other | Source: Ambulatory Visit | Attending: Radiation Oncology | Admitting: Radiation Oncology

## 2019-04-05 DIAGNOSIS — C7931 Secondary malignant neoplasm of brain: Secondary | ICD-10-CM | POA: Diagnosis not present

## 2019-04-05 DIAGNOSIS — C3490 Malignant neoplasm of unspecified part of unspecified bronchus or lung: Secondary | ICD-10-CM | POA: Diagnosis not present

## 2019-04-05 MED ORDER — HEPARIN SOD (PORK) LOCK FLUSH 100 UNIT/ML IV SOLN
500.0000 [IU] | Freq: Once | INTRAVENOUS | Status: AC
  Administered 2019-04-05: 500 [IU] via INTRAVENOUS

## 2019-04-05 MED ORDER — GADOBENATE DIMEGLUMINE 529 MG/ML IV SOLN
8.0000 mL | Freq: Once | INTRAVENOUS | Status: AC | PRN
  Administered 2019-04-05: 8 mL via INTRAVENOUS

## 2019-04-10 ENCOUNTER — Inpatient Hospital Stay: Payer: Medicare Other

## 2019-04-10 ENCOUNTER — Other Ambulatory Visit: Payer: Self-pay | Admitting: Physician Assistant

## 2019-04-10 ENCOUNTER — Other Ambulatory Visit: Payer: Self-pay

## 2019-04-10 ENCOUNTER — Ambulatory Visit (HOSPITAL_COMMUNITY)
Admission: RE | Admit: 2019-04-10 | Discharge: 2019-04-10 | Disposition: A | Discharge status: Home / Self Care | Payer: Medicare Other | Source: Ambulatory Visit | Attending: Physician Assistant | Admitting: Physician Assistant

## 2019-04-10 ENCOUNTER — Inpatient Hospital Stay: Payer: Medicare Other | Attending: Physician Assistant

## 2019-04-10 ENCOUNTER — Encounter: Payer: Self-pay | Admitting: Radiation Oncology

## 2019-04-10 ENCOUNTER — Ambulatory Visit
Admission: RE | Admit: 2019-04-10 | Discharge: 2019-04-10 | Disposition: A | Discharge status: Home / Self Care | Payer: Medicare Other | Source: Ambulatory Visit | Attending: Radiation Oncology | Admitting: Radiation Oncology

## 2019-04-10 DIAGNOSIS — I313 Pericardial effusion (noninflammatory): Secondary | ICD-10-CM | POA: Diagnosis not present

## 2019-04-10 DIAGNOSIS — C3492 Malignant neoplasm of unspecified part of left bronchus or lung: Secondary | ICD-10-CM | POA: Insufficient documentation

## 2019-04-10 DIAGNOSIS — M4316 Spondylolisthesis, lumbar region: Secondary | ICD-10-CM | POA: Diagnosis not present

## 2019-04-10 DIAGNOSIS — I700 Atherosclerosis of aorta: Secondary | ICD-10-CM | POA: Diagnosis not present

## 2019-04-10 DIAGNOSIS — C3490 Malignant neoplasm of unspecified part of unspecified bronchus or lung: Secondary | ICD-10-CM | POA: Diagnosis not present

## 2019-04-10 DIAGNOSIS — Z95828 Presence of other vascular implants and grafts: Secondary | ICD-10-CM

## 2019-04-10 DIAGNOSIS — C7931 Secondary malignant neoplasm of brain: Secondary | ICD-10-CM | POA: Insufficient documentation

## 2019-04-10 DIAGNOSIS — R5382 Chronic fatigue, unspecified: Secondary | ICD-10-CM

## 2019-04-10 DIAGNOSIS — R918 Other nonspecific abnormal finding of lung field: Secondary | ICD-10-CM | POA: Insufficient documentation

## 2019-04-10 DIAGNOSIS — Z79899 Other long term (current) drug therapy: Secondary | ICD-10-CM | POA: Diagnosis not present

## 2019-04-10 DIAGNOSIS — I2510 Atherosclerotic heart disease of native coronary artery without angina pectoris: Secondary | ICD-10-CM | POA: Insufficient documentation

## 2019-04-10 DIAGNOSIS — C3412 Malignant neoplasm of upper lobe, left bronchus or lung: Secondary | ICD-10-CM | POA: Insufficient documentation

## 2019-04-10 DIAGNOSIS — Z5112 Encounter for antineoplastic immunotherapy: Secondary | ICD-10-CM | POA: Insufficient documentation

## 2019-04-10 DIAGNOSIS — Z08 Encounter for follow-up examination after completed treatment for malignant neoplasm: Secondary | ICD-10-CM | POA: Diagnosis not present

## 2019-04-10 LAB — CBC WITH DIFFERENTIAL (CANCER CENTER ONLY)
Abs Immature Granulocytes: 0.01 10*3/uL (ref 0.00–0.07)
Basophils Absolute: 0 10*3/uL (ref 0.0–0.1)
Basophils Relative: 1 %
Eosinophils Absolute: 0.3 10*3/uL (ref 0.0–0.5)
Eosinophils Relative: 6 %
HCT: 30.9 % — ABNORMAL LOW (ref 36.0–46.0)
Hemoglobin: 9.7 g/dL — ABNORMAL LOW (ref 12.0–15.0)
Immature Granulocytes: 0 %
Lymphocytes Relative: 25 %
Lymphs Abs: 1.5 10*3/uL (ref 0.7–4.0)
MCH: 27.2 pg (ref 26.0–34.0)
MCHC: 31.4 g/dL (ref 30.0–36.0)
MCV: 86.8 fL (ref 80.0–100.0)
Monocytes Absolute: 0.8 10*3/uL (ref 0.1–1.0)
Monocytes Relative: 14 %
Neutro Abs: 3.2 10*3/uL (ref 1.7–7.7)
Neutrophils Relative %: 54 %
Platelet Count: 206 10*3/uL (ref 150–400)
RBC: 3.56 MIL/uL — ABNORMAL LOW (ref 3.87–5.11)
RDW: 15.6 % — ABNORMAL HIGH (ref 11.5–15.5)
WBC Count: 5.9 10*3/uL (ref 4.0–10.5)
nRBC: 0 % (ref 0.0–0.2)

## 2019-04-10 LAB — CMP (CANCER CENTER ONLY)
ALT: 13 U/L (ref 0–44)
AST: 33 U/L (ref 15–41)
Albumin: 3.6 g/dL (ref 3.5–5.0)
Alkaline Phosphatase: 72 U/L (ref 38–126)
Anion gap: 11 (ref 5–15)
BUN: 14 mg/dL (ref 8–23)
CO2: 29 mmol/L (ref 22–32)
Calcium: 9.5 mg/dL (ref 8.9–10.3)
Chloride: 102 mmol/L (ref 98–111)
Creatinine: 1.72 mg/dL — ABNORMAL HIGH (ref 0.44–1.00)
GFR, Est AFR Am: 36 mL/min — ABNORMAL LOW (ref >60)
GFR, Estimated: 31 mL/min — ABNORMAL LOW (ref >60)
Glucose, Bld: 87 mg/dL (ref 70–99)
Potassium: 3.7 mmol/L (ref 3.5–5.1)
Sodium: 142 mmol/L (ref 135–145)
Total Bilirubin: 0.3 mg/dL (ref 0.3–1.2)
Total Protein: 7.8 g/dL (ref 6.5–8.1)

## 2019-04-10 LAB — TSH: TSH: 2.012 u[IU]/mL (ref 0.308–3.960)

## 2019-04-10 MED ORDER — HEPARIN SOD (PORK) LOCK FLUSH 100 UNIT/ML IV SOLN
INTRAVENOUS | Status: AC
  Filled 2019-04-10: qty 5

## 2019-04-10 MED ORDER — SODIUM CHLORIDE 0.9% FLUSH
10.0000 mL | INTRAVENOUS | Status: DC | PRN
  Administered 2019-04-10: 10 mL
  Filled 2019-04-10: qty 10

## 2019-04-10 NOTE — Progress Notes (Signed)
[facility] OFFICE PROGRESS NOTE  [name], MD [address] [place] [address]  DIAGNOSIS: Stage IV(T1c, N1, M1 C)non-small cell lung cancer, adenocarcinoma. Shepresented with left upper lobe lung nodule in addition to right upper lobe lung nodule with left hilar lymphadenopathy as well as multiple brain metastasis.She was diagnosed in July of 2020  Biomarker Findings Microsatellite status - MS-Stable Tumor Mutational Burden - 4 Muts/Mb Genomic Findings For a complete list of the genes assayed, please refer to the Appendix. ATM G204* KRAS G12C PDGFRA P122T SMO R199W 7 Disease relevant genes with no reportable alterations: ALK, BRAF, EGFR, ERBB2, MET, RET, ROS1  PDL 1 expressionwas 1%  PRIOR THERAPY: SBRT to multiple brain metastasis under the care of Dr. Isidore Moos.Completed in August 2020.  CURRENT THERAPY: Systemic chemotherapy with Carboplatin for an AUC of 5, Alimta 500 mg/m2, and Keytruda 200 mg IV every 3 weeks. Status post9cyclesof treatment. First dose on 10/05/2018.  INTERVAL HISTORY: VIERA OKONSKI 64 y.o. female returns to the clinic for a follow up visit. The patient is feeling well today without any concerning complaints. She recently had the COVID-19 vaccine and developed significant arthralgias for several days following this injection. Her symptoms have subsided at this time. The patient continues to tolerate treatment with well without any adverse side effects except her creatinine has been elevating recently. Alimta was held with her last cycle of chemotherapy. A UA was performed x2 over the last few months which was unremarkable. She states she has been drinking plenty of water recently. She even cut out other beverages that are not water. She denies NSAID use. Denies dysuria, cloudy urine, or changes in urinary frequency. She notes it occasionally has an unusual odor. Denies any fever, chills, night sweats, or weight loss.  Denies any chest pain, shortness of breath, cough, or hemoptysis. Denies any nausea, vomiting, diarrhea, or constipation. Denies any headache or visual changes. Had a follow up appointment with Dr. Isidore Moos recently which was negative for any concerning findings for recurrence/progressive findings for her history of metastatic disease to the brain. Denies any rashes or skin changes. She recently had a restaging CT scan performed. The patient is here today for evaluation and to review her scan prior to starting cycle #10   MEDICAL HISTORY: Past Medical History:  Diagnosis Date  . Anginal pain ([place])    " with exertion "  . Arthritis    " 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 "  . Asthma    h/o  . Coronary artery disease   . Diabetes mellitus without complication ([place])    Type II  . GERD (gastroesophageal reflux disease)   . H/O hiatal hernia   . Heart murmur   . History of radiation therapy 09/22/2018   stereotactic radiosurgery   . 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 Hypertension   . nscl ca dx'd 08/07/18  . Persistent headaches    h/o  . Pneumonia    hx of PNA  . Shortness of breath   . Vaginal dryness    h/o    ALLERGIES:  is allergic to lisinopril.  MEDICATIONS:  Current Outpatient Medications  Medication Sig Dispense Refill  . acetaminophen (TYLENOL) 500 MG tablet Take 1,000 mg by mouth every 8 (eight) hours as needed for mild pain, fever or headache.     Marland Kitchen amLODipine (NORVASC) 5 MG tablet Take 5 mg by mouth at bedtime.     Marland Kitchen aspirin EC 81 MG tablet Take  81 mg by mouth daily.    . carvedilol (COREG) 25 MG tablet Take 12.5 mg by mouth 2 (two) times daily with a meal.     . cetirizine (ZYRTEC) 10 MG tablet Take 10 mg by mouth daily.    . Cholecalciferol (VITAMIN D-3) 125 MCG (5000 UT) TABS Take 5,000 Units by mouth daily.    . Chromium Picolinate 1000 MCG TABS Take 1,000 mg by mouth daily.    Marland Kitchen esomeprazole (NEXIUM) 20 MG capsule Take 20 mg by mouth daily at 12 noon.    . folic acid (FOLVITE) 1 MG tablet Take  1 tablet (1 mg total) by mouth daily. 30 tablet 2  . glimepiride (AMARYL) 1 MG tablet Take 0.5 mg by mouth daily as needed.    . hydrochlorothiazide (MICROZIDE) 12.5 MG capsule Take 12.5 mg by mouth daily.    Marland Kitchen lidocaine-prilocaine (EMLA) cream Apply to the Port-A-Cath site 30 to 60 minutes before chemotherapy. 30 g 2  . metFORMIN (GLUCOPHAGE) 500 MG tablet Take 500 mg by mouth 2 (two) times daily with a meal.     . methocarbamol (ROBAXIN) 500 MG tablet Take 1 tablet (500 mg total) by mouth every 6 (six) hours as needed for muscle spasms. 21 tablet 0  . nitroGLYCERIN (NITROSTAT) 0.4 MG SL tablet Place 1 tablet (0.4 mg total) under the tongue every 5 (five) minutes as needed for chest pain. (Patient not taking: Reported on 03/22/2019) 25 tablet 2  . ONETOUCH ULTRA test strip USE 1 STRIP TO CHECK GLUCOSE THREE TIMES DAILY    . Pitavastatin Calcium (LIVALO) 4 MG TABS Take 4 mg by mouth at bedtime.     Vladimir Faster Glycol-Propyl Glycol (SYSTANE) 0.4-0.3 % SOLN Place 1 drop into both eyes every 6 (six) hours as needed (dry eyes).     . prochlorperazine (COMPAZINE) 10 MG tablet Take 1 tablet (10 mg total) by mouth every 6 (six) hours as needed for nausea or vomiting. 30 tablet 2  . psyllium (METAMUCIL) 58.6 % packet Take 1 packet by mouth at bedtime as needed (constipation).     Marland Kitchen telmisartan (MICARDIS) 40 MG tablet Take 40 mg by mouth daily.    Marland Kitchen zolpidem (AMBIEN) 10 MG tablet Take 2.5 mg by mouth at bedtime.      No current facility-administered medications for this visit.    SURGICAL HISTORY:  Past Surgical History:  Procedure Laterality Date  . ABDOMINAL HYSTERECTOMY    . bone spur    . CARDIAC CATHETERIZATION  06/13/2012  . carpel tunnel surgery Left   . COLONOSCOPY     9 years ago  . CORONARY ANGIOPLASTY  06/13/2012  . EYE SURGERY     cyst left eye   . IR IMAGING GUIDED PORT INSERTION  10/10/2018  . LEFT HEART CATHETERIZATION WITH CORONARY ANGIOGRAM N/A 06/13/2012   Procedure: LEFT HEART  CATHETERIZATION WITH CORONARY ANGIOGRAM;  Surgeon: Laverda Page, MD;  [facility] C[facility] LAB;  Service: Cardiovascular;  Laterality: N/A;  . NECK SURGERY    . rotator cuff surgery Right 2015  . VIDEO BRONCHOSCOPY WITH ENDOBRONCHIAL NAVIGATION N/A 09/06/2018   Procedure: VIDEO BRONCHOSCOPY WITH ENDOBRONCHIAL NAVIGATION, left upper lung;  Surgeon: Lajuana Matte, MD;  Location: [facility];  Service: Thoracic;  Laterality: N/A;  . VIDEO BRONCHOSCOPY WITH ENDOBRONCHIAL ULTRASOUND Left 09/06/2018   Procedure: VIDEO BRONCHOSCOPY WITH ENDOBRONCHIAL ULTRASOUND, left lung;  Surgeon: Lajuana Matte, MD;  Location: [facility];  Service: Thoracic;  Laterality: Left;  . WISDOM  TOOTH EXTRACTION      REVIEW OF SYSTEMS:   Review of Systems  Constitutional: Negative for appetite change, chills, fatigue, fever and unexpected weight change.  HENT: Negative for mouth sores, nosebleeds, sore throat and trouble swallowing.   Eyes: Negative for eye problems and icterus.  Respiratory: Negative for cough, hemoptysis, shortness of breath and wheezing.   Cardiovascular: Negative for chest pain and leg swelling.  Gastrointestinal: Negative for abdominal pain, constipation, diarrhea, nausea and vomiting.  Genitourinary: Negative for bladder incontinence, difficulty urinating, dysuria, frequency and hematuria.   Musculoskeletal: Negative for back pain, gait problem, neck pain and neck stiffness.  Skin: Negative for itching and rash.  Neurological: Negative for dizziness, extremity weakness, gait problem, headaches, light-headedness and seizures.  Hematological: Negative for adenopathy. Does not bruise/bleed easily.  Psychiatric/Behavioral: Negative for confusion, depression and sleep disturbance. The patient is not nervous/anxious.     PHYSICAL EXAMINATION:  Blood pressure 134/74, pulse 67, temperature 98.2 F (36.8 C), temperature source Temporal, resp. rate 18, height 5' (1.524 m), weight 173 lb 11.2 oz (78.8  kg), SpO2 100 %.  ECOG PERFORMANCE STATUS: 1 - Symptomatic but completely ambulatory  Physical Exam  Constitutional: Oriented to person, place, and time and well-developed, well-nourished, and in no distress.  HENT:  Head: Normocephalic and atraumatic.  Mouth/Throat: Oropharynx is clear and moist. No oropharyngeal exudate.  Eyes: Conjunctivae are normal. Right eye exhibits no discharge. Left eye exhibits no discharge. No scleral icterus.  Neck: Normal range of motion. Neck supple.  Cardiovascular: Normal rate, regular rhythm, normal heart sounds and intact distal pulses.   Pulmonary/Chest: Effort normal and breath sounds normal. No respiratory distress. No wheezes. No rales.  Abdominal: Soft. Bowel sounds are normal. Exhibits no distension and no mass. There is no tenderness.  Musculoskeletal: Normal range of motion. Exhibits no edema.  Lymphadenopathy:    No cervical adenopathy.  Neurological: Alert and oriented to person, place, and time. Exhibits normal muscle tone. Gait normal. Coordination normal.  Skin: Skin is warm and dry. No rash noted. Not diaphoretic. No erythema. No pallor.  Psychiatric: Mood, memory and judgment normal.  Vitals reviewed.  LABORATORY DATA: Lab Results  Component Value Date   WBC 5.9 04/10/2019   HGB 9.7 (L) 04/10/2019   HCT 30.9 (L) 04/10/2019   MCV 86.8 04/10/2019   PLT 206 04/10/2019      Chemistry      Component Value Date/Time   NA 142 04/10/2019 0933   K 3.7 04/10/2019 0933   CL 102 04/10/2019 0933   CO2 29 04/10/2019 0933   BUN 14 04/10/2019 0933   CREATININE 1.72 (H) 04/10/2019 0933      Component Value Date/Time   CALCIUM 9.5 04/10/2019 0933   ALKPHOS 72 04/10/2019 0933   AST 33 04/10/2019 0933   ALT 13 04/10/2019 0933   BILITOT 0.3 04/10/2019 0933       RADIOGRAPHIC STUDIES:  CT ABDOMEN PELVIS WO CONTRAST  Result Date: 04/10/2019 CLINICAL DATA:  Adenocarcinoma of the lung. Restaging. EXAM: CT CHEST, ABDOMEN AND PELVIS  WITHOUT CONTRAST TECHNIQUE: Multidetector CT imaging of the chest, abdomen and pelvis was performed following the standard protocol without IV contrast. COMPARISON:  02/07/2019 FINDINGS: CT CHEST FINDINGS Cardiovascular: There is a small pericardial effusion which is new from previous exam, image 40/2. Aortic atherosclerosis. Three vessel coronary artery calcifications. Mediastinum/Nodes: No enlarged mediastinal, hilar, or axillary lymph nodes. Thyroid gland, trachea, and esophagus demonstrate no significant findings. Lungs/Pleura: No pleural effusion identified. No airspace consolidation,  atelectasis or pneumothorax. -index subpleural right upper lobe pulmonary nodule measures 8 mm, image 42/6. Previously 7 mm. -index right lower lobe subpleural nodule measures 4 mm, image 66/3. -index spiculated posterior left upper lobe lung nodule measures 9 mm, image 47/6. Stable. -Faint 4 mm lung nodule in the superior segment of left lower lobe is unchanged, image 61/6. No new or progressive nodules. Musculoskeletal: No chest wall mass or suspicious bone lesions identified. CT ABDOMEN PELVIS FINDINGS Hepatobiliary: No focal liver abnormality is seen. No gallstones, gallbladder wall thickening, or biliary dilatation. Pancreas: Unremarkable. No pancreatic ductal dilatation or surrounding inflammatory changes. Spleen: Normal in size without focal abnormality. Adrenals/Urinary Tract: Normal appearance of the adrenal glands. No kidney mass or hydronephrosis. Stomach/Bowel: Stomach is within normal limits. Appendix appears normal. No evidence of bowel wall thickening, distention, or inflammatory changes. Vascular/Lymphatic: Aortic atherosclerosis. No abdominopelvic adenopathy. Reproductive: Status post hysterectomy. No adnexal masses. Other: No free fluid or fluid collections. Musculoskeletal: No acute or significant osseous findings. Anterolisthesis of L4 on L5 is identified measuring 0.9 cm. Bilateral facet hypertrophy and  degenerative change. IMPRESSION: 1. No specific findings identified to suggest recurrent or metastatic disease within the chest, abdomen or pelvis. 2. Bilateral pulmonary nodules are again noted. These are not significantly changed in the interval. 3. New small pericardial effusion. 4. Aortic atherosclerosis and 3 vessel coronary artery calcifications. 5. Anterolisthesis of L4 on L5. Aortic Atherosclerosis (ICD10-I70.0). Electronically Signed   By: Kerby Moors M.D.   On: 04/10/2019 13:34   CT CHEST WO CONTRAST  Result Date: 04/10/2019 CLINICAL DATA:  Adenocarcinoma of the lung. Restaging. EXAM: CT CHEST, ABDOMEN AND PELVIS WITHOUT CONTRAST TECHNIQUE: Multidetector CT imaging of the chest, abdomen and pelvis was performed following the standard protocol without IV contrast. COMPARISON:  02/07/2019 FINDINGS: CT CHEST FINDINGS Cardiovascular: There is a small pericardial effusion which is new from previous exam, image 40/2. Aortic atherosclerosis. Three vessel coronary artery calcifications. Mediastinum/Nodes: No enlarged mediastinal, hilar, or axillary lymph nodes. Thyroid gland, trachea, and esophagus demonstrate no significant findings. Lungs/Pleura: No pleural effusion identified. No airspace consolidation, atelectasis or pneumothorax. -index subpleural right upper lobe pulmonary nodule measures 8 mm, image 42/6. Previously 7 mm. -index right lower lobe subpleural nodule measures 4 mm, image 66/3. -index spiculated posterior left upper lobe lung nodule measures 9 mm, image 47/6. Stable. -Faint 4 mm lung nodule in the superior segment of left lower lobe is unchanged, image 61/6. No new or progressive nodules. Musculoskeletal: No chest wall mass or suspicious bone lesions identified. CT ABDOMEN PELVIS FINDINGS Hepatobiliary: No focal liver abnormality is seen. No gallstones, gallbladder wall thickening, or biliary dilatation. Pancreas: Unremarkable. No pancreatic ductal dilatation or surrounding inflammatory  changes. Spleen: Normal in size without focal abnormality. Adrenals/Urinary Tract: Normal appearance of the adrenal glands. No kidney mass or hydronephrosis. Stomach/Bowel: Stomach is within normal limits. Appendix appears normal. No evidence of bowel wall thickening, distention, or inflammatory changes. Vascular/Lymphatic: Aortic atherosclerosis. No abdominopelvic adenopathy. Reproductive: Status post hysterectomy. No adnexal masses. Other: No free fluid or fluid collections. Musculoskeletal: No acute or significant osseous findings. Anterolisthesis of L4 on L5 is identified measuring 0.9 cm. Bilateral facet hypertrophy and degenerative change. IMPRESSION: 1. No specific findings identified to suggest recurrent or metastatic disease within the chest, abdomen or pelvis. 2. Bilateral pulmonary nodules are again noted. These are not significantly changed in the interval. 3. New small pericardial effusion. 4. Aortic atherosclerosis and 3 vessel coronary artery calcifications. 5. Anterolisthesis of L4 on L5. Aortic Atherosclerosis (ICD10-I70.0).  Electronically Signed   By: Kerby Moors M.D.   On: 04/10/2019 13:34   MR Brain W Wo Contrast  Result Date: 04/05/2019 CLINICAL DATA:  Brain metastasis. Follow-up treatment; brain/CNS neoplasm, surveillance. 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 EXAM: MRI HEAD WITHOUT AND WITH CONTRAST TECHNIQUE: Multiplanar, multiecho pulse sequences of the brain and surrounding structures were obtained without and with intravenous contrast. CONTRAST:  47m MULTIHANCE GADOBENATE DIMEGLUMINE 529 MG/ML IV SOLN COMPARISON:  Brain MRI 12/22/2018 FINDINGS: Brain: No abnormal enhancement is demonstrated to suggest active intracranial metastatic disease. There is no evidence of acute infarct. No midline shift or extra-axial fluid collection. No chronic intracranial blood products. A few  punctate nonenhancing T2/FLAIR hyperintense white matter foci within the bilateral cerebral hemispheres are unchanged. Redemonstrated encephalomalacia within the inferior cerebellar hemispheres (greater on the left). Background cerebral volume is normal for age. Vascular: No hyperdense vessel. Skull and upper cervical spine: No focal marrow lesion. ACDF hardware within the partially imaged cervical spine. Sinuses/Orbits: Visualized orbits demonstrate no acute abnormality. Mild ethmoid sinus mucosal thickening. Trace bilateral mastoid effusions. IMPRESSION: No abnormal enhancement to suggest active intracranial metastatic disease. Redemonstrated encephalomalacia within the inferior cerebellar hemispheres, of uncertain etiology. Mild ethmoid sinus mucosal thickening. Trace bilateral mastoid effusions. Electronically Signed   By: KKellie SimmeringDO   On: 04/05/2019 14:25     ASSESSMENT/PLAN:  This is a very pleasant 64year old African-American female with stage IV (T1c, N1,M1c)lung cancer. She presented with a dominant left upper lobe lung nodule in addition to a right upper lobe pulmonary nodule with left hilar lymphadenopathy. She also has metastatic disease to the brain.She has no actionable mutations and her PDL1 expression is 1%. She was diagnosed in July 2020.  The patient completed SBRT to the metastatic brain lesion under the care of Dr. SIsidore Moosin August 2020.   The patient is currently undergoing systemic chemotherapy withcarboplatin for an AUC of 5, Alimta 500 mg/m, and Keytruda 200 mg IV every 3 weeks. She is status post9cyclesof treatment. Starting from cycle #5, she has been on maintenance Alimta 500 mg/m2 and Keytruda 200 mg IV.She tolerated her treatment well without anyconcerningadverse side effects.Alimta was held for cycle #9 due to increasing creatinine.  The patient recently had a restaging CT scan performed. Dr. MJulien Nordmannpersonally and independently reviewed the scan and  discussed the results with the patient. The scan did not show any evidence of disease progression.   Dr. MJulien Nordmannrecommends that she continue on the same treatment at the same dose. Due to her elevated creatinine,. Dr. MJulien Nordmannrecommends that she proceed with only Keytruda today. If her creatinine continues to elevate on routine labs, we will refer her to nephrology for further evaluation. She was encouraged to continue to drink plenty of water.   We will see her back for a follow up visit in 3 weeks for evaluation and repeat lab work before starting cycle #11.   The patient was advised to call immediately if she has any concerning symptoms in the interval. The patient voices understanding of current disease status and treatment options and is in agreement with the current care plan. All questions were answered. The patient knows to call the clinic with any problems, questions or concerns. We can certainly see the patient much sooner if necessary     No orders of the defined types were placed in this encounter.    Tateanna Bach L Serayah Yazdani, PA-C 04/12/19  ADDENDUM: Hematology/Oncology Attending: I had a face-to-face encounter with the patient today.  I recommended her care plan.  This is a very pleasant 64 years old African-American female with a stage IV non-small cell lung cancer, adenocarcinoma with no actionable mutation and with brain metastases status post SRS to brain lesions in August 2020.  The patient started systemic chemotherapy with carboplatin, Alimta and Keytruda status post 4 cycles and she is currently on maintenance treatment with Alimta and Keytruda every 3 weeks status post 9 more cycles of this treatment. The patient has been tolerating her treatment well with no concerning adverse effects. She had repeat CT scan of the chest, abdomen pelvis performed recently.  I personally and independently reviewed the scans and discussed the results with the patient today. Her scan  showed no concerning findings for disease progression. I recommended for the patient to continue her current maintenance treatment but she will receive only single agent Keytruda for this cycle because of the renal insufficiency. We will continue to monitor her renal function closely and if she has improvement in the future, we will resume her treatment with Alimta. The patient will come back for follow-up visit in 3 weeks for evaluation before the next cycle of her treatment. She was advised to call immediately if she has any concerning symptoms in the interval.  Disclaimer: This note was dictated with voice recognition software. Similar sounding words can inadvertently be transcribed and may be missed upon review. Eilleen Kempf, MD 04/12/19

## 2019-04-10 NOTE — Progress Notes (Signed)
Radiation Oncology         [phone] ________________________________  Name: Carolyn [name] MRN: [id number]  Date: 04/10/2019  DOB: July 25, 1955  Follow-Up Visit Note by phone as she cannot access MyChart video during pandemic precautions today  CC: [name], MD  Curt Bears, MD  Diagnosis and Prior Radiotherapy:    ICD-10-CM   1. Brain metastases ([place])  C79.31     CHIEF COMPLAINT: Here for follow-up and surveillance of brain metastases  Narrative:  She is doing well without any new symptomatic concerns. I personally reviewed her MRI brain images. She has no evidence of active metastatic disease in her brain. She also underwent CT images today which show no evidence of relapse or progression. She has been receiving Keytruda and Alimta and carboplatin in medical oncology.       ALLERGIES:  is allergic to lisinopril.  Meds: Current Outpatient Medications  Medication Sig Dispense Refill  . acetaminophen (TYLENOL) 500 MG tablet Take 1,000 mg by mouth every 8 (eight) hours as needed for mild pain, fever or headache.     Marland Kitchen amLODipine (NORVASC) 5 MG tablet Take 5 mg by mouth at bedtime.     Marland Kitchen aspirin EC 81 MG tablet Take 81 mg by mouth daily.    . carvedilol (COREG) 25 MG tablet Take 12.5 mg by mouth 2 (two) times daily with a meal.     . cetirizine (ZYRTEC) 10 MG tablet Take 10 mg by mouth daily.    . Cholecalciferol (VITAMIN D-3) 125 MCG (5000 UT) TABS Take 5,000 Units by mouth daily.    . Chromium Picolinate 1000 MCG TABS Take 1,000 mg by mouth daily.    Marland Kitchen esomeprazole (NEXIUM) 20 MG capsule Take 20 mg by mouth daily at 12 noon.    . folic acid (FOLVITE) 1 MG tablet Take 1 tablet (1 mg total) by mouth daily. 30 tablet 2  . glimepiride (AMARYL) 1 MG tablet Take 0.5 mg by mouth daily as needed.    . hydrochlorothiazide (MICROZIDE) 12.5 MG capsule Take 12.5 mg by mouth daily.    Marland Kitchen lidocaine-prilocaine (EMLA) cream Apply to the Port-A-Cath site 30 to 60 minutes before  chemotherapy. 30 g 2  . metFORMIN (GLUCOPHAGE) 500 MG tablet Take 500 mg by mouth 2 (two) times daily with a meal.     . methocarbamol (ROBAXIN) 500 MG tablet Take 1 tablet (500 mg total) by mouth every 6 (six) hours as needed for muscle spasms. 21 tablet 0  . nitroGLYCERIN (NITROSTAT) 0.4 MG SL tablet Place 1 tablet (0.4 mg total) under the tongue every 5 (five) minutes as needed for chest pain. (Patient not taking: Reported on 03/22/2019) 25 tablet 2  . ONETOUCH ULTRA test strip USE 1 STRIP TO CHECK GLUCOSE THREE TIMES DAILY    . Pitavastatin Calcium (LIVALO) 4 MG TABS Take 4 mg by mouth at bedtime.     Vladimir Faster Glycol-Propyl Glycol (SYSTANE) 0.4-0.3 % SOLN Place 1 drop into both eyes every 6 (six) hours as needed (dry eyes).     . prochlorperazine (COMPAZINE) 10 MG tablet Take 1 tablet (10 mg total) by mouth every 6 (six) hours as needed for nausea or vomiting. 30 tablet 2  . psyllium (METAMUCIL) 58.6 % packet Take 1 packet by mouth at bedtime as needed (constipation).     Marland Kitchen telmisartan (MICARDIS) 40 MG tablet Take 40 mg by mouth daily.    Marland Kitchen zolpidem (AMBIEN) 10 MG tablet Take 2.5 mg by mouth at  bedtime.      No current facility-administered medications for this encounter.   Facility-Administered Medications Ordered in Other Encounters  Medication Dose Route Frequency Provider Last Rate Last Admin  . heparin lock flush 100 UNIT/ML injection             Physical Findings: The patient is in no acute distress. Patient is alert and oriented.  vitals were not taken for this visit. .      Lab Findings: Lab Results  Component Value Date   WBC 5.9 04/10/2019   HGB 9.7 (L) 04/10/2019   HCT 30.9 (L) 04/10/2019   MCV 86.8 04/10/2019   PLT 206 04/10/2019    Radiographic Findings: CT ABDOMEN PELVIS WO CONTRAST  Result Date: 04/10/2019 CLINICAL DATA:  Adenocarcinoma of the lung. Restaging. EXAM: CT CHEST, ABDOMEN AND PELVIS WITHOUT CONTRAST TECHNIQUE: Multidetector CT imaging of the chest,  abdomen and pelvis was performed following the standard protocol without IV contrast. COMPARISON:  02/07/2019 FINDINGS: CT CHEST FINDINGS Cardiovascular: There is a small pericardial effusion which is new from previous exam, image 40/2. Aortic atherosclerosis. Three vessel coronary artery calcifications. Mediastinum/Nodes: No enlarged mediastinal, hilar, or axillary lymph nodes. Thyroid gland, trachea, and esophagus demonstrate no significant findings. Lungs/Pleura: No pleural effusion identified. No airspace consolidation, atelectasis or pneumothorax. -index subpleural right upper lobe pulmonary nodule measures 8 mm, image 42/6. Previously 7 mm. -index right lower lobe subpleural nodule measures 4 mm, image 66/3. -index spiculated posterior left upper lobe lung nodule measures 9 mm, image 47/6. Stable. -Faint 4 mm lung nodule in the superior segment of left lower lobe is unchanged, image 61/6. No new or progressive nodules. Musculoskeletal: No chest wall mass or suspicious bone lesions identified. CT ABDOMEN PELVIS FINDINGS Hepatobiliary: No focal liver abnormality is seen. No gallstones, gallbladder wall thickening, or biliary dilatation. Pancreas: Unremarkable. No pancreatic ductal dilatation or surrounding inflammatory changes. Spleen: Normal in size without focal abnormality. Adrenals/Urinary Tract: Normal appearance of the adrenal glands. No kidney mass or hydronephrosis. Stomach/Bowel: Stomach is within normal limits. Appendix appears normal. No evidence of bowel wall thickening, distention, or inflammatory changes. Vascular/Lymphatic: Aortic atherosclerosis. No abdominopelvic adenopathy. Reproductive: Status post hysterectomy. No adnexal masses. Other: No free fluid or fluid collections. Musculoskeletal: No acute or significant osseous findings. Anterolisthesis of L4 on L5 is identified measuring 0.9 cm. Bilateral facet hypertrophy and degenerative change. IMPRESSION: 1. No specific findings identified to  suggest recurrent or metastatic disease within the chest, abdomen or pelvis. 2. Bilateral pulmonary nodules are again noted. These are not significantly changed in the interval. 3. New small pericardial effusion. 4. Aortic atherosclerosis and 3 vessel coronary artery calcifications. 5. Anterolisthesis of L4 on L5. Aortic Atherosclerosis (ICD10-I70.0). Electronically Signed   By: Kerby Moors M.D.   On: 04/10/2019 13:34   CT CHEST WO CONTRAST  Result Date: 04/10/2019 CLINICAL DATA:  Adenocarcinoma of the lung. Restaging. EXAM: CT CHEST, ABDOMEN AND PELVIS WITHOUT CONTRAST TECHNIQUE: Multidetector CT imaging of the chest, abdomen and pelvis was performed following the standard protocol without IV contrast. COMPARISON:  02/07/2019 FINDINGS: CT CHEST FINDINGS Cardiovascular: There is a small pericardial effusion which is new from previous exam, image 40/2. Aortic atherosclerosis. Three vessel coronary artery calcifications. Mediastinum/Nodes: No enlarged mediastinal, hilar, or axillary lymph nodes. Thyroid gland, trachea, and esophagus demonstrate no significant findings. Lungs/Pleura: No pleural effusion identified. No airspace consolidation, atelectasis or pneumothorax. -index subpleural right upper lobe pulmonary nodule measures 8 mm, image 42/6. Previously 7 mm. -index right lower lobe subpleural nodule  measures 4 mm, image 66/3. -index spiculated posterior left upper lobe lung nodule measures 9 mm, image 47/6. Stable. -Faint 4 mm lung nodule in the superior segment of left lower lobe is unchanged, image 61/6. No new or progressive nodules. Musculoskeletal: No chest wall mass or suspicious bone lesions identified. CT ABDOMEN PELVIS FINDINGS Hepatobiliary: No focal liver abnormality is seen. No gallstones, gallbladder wall thickening, or biliary dilatation. Pancreas: Unremarkable. No pancreatic ductal dilatation or surrounding inflammatory changes. Spleen: Normal in size without focal abnormality.  Adrenals/Urinary Tract: Normal appearance of the adrenal glands. No kidney mass or hydronephrosis. Stomach/Bowel: Stomach is within normal limits. Appendix appears normal. No evidence of bowel wall thickening, distention, or inflammatory changes. Vascular/Lymphatic: Aortic atherosclerosis. No abdominopelvic adenopathy. Reproductive: Status post hysterectomy. No adnexal masses. Other: No free fluid or fluid collections. Musculoskeletal: No acute or significant osseous findings. Anterolisthesis of L4 on L5 is identified measuring 0.9 cm. Bilateral facet hypertrophy and degenerative change. IMPRESSION: 1. No specific findings identified to suggest recurrent or metastatic disease within the chest, abdomen or pelvis. 2. Bilateral pulmonary nodules are again noted. These are not significantly changed in the interval. 3. New small pericardial effusion. 4. Aortic atherosclerosis and 3 vessel coronary artery calcifications. 5. Anterolisthesis of L4 on L5. Aortic Atherosclerosis (ICD10-I70.0). Electronically Signed   By: Kerby Moors M.D.   On: 04/10/2019 13:34   MR Brain W Wo Contrast  Result Date: 04/05/2019 CLINICAL DATA:  Brain metastasis. Follow-up treatment; brain/CNS neoplasm, surveillance. 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 EXAM: MRI HEAD WITHOUT AND WITH CONTRAST TECHNIQUE: Multiplanar, multiecho pulse sequences of the brain and surrounding structures were obtained without and with intravenous contrast. CONTRAST:  100mL MULTIHANCE GADOBENATE DIMEGLUMINE 529 MG/ML IV SOLN COMPARISON:  Brain MRI 12/22/2018 FINDINGS: Brain: No abnormal enhancement is demonstrated to suggest active intracranial metastatic disease. There is no evidence of acute infarct. No midline shift or extra-axial fluid collection. No chronic intracranial blood products. A few punctate nonenhancing T2/FLAIR hyperintense white matter foci  within the bilateral cerebral hemispheres are unchanged. Redemonstrated encephalomalacia within the inferior cerebellar hemispheres (greater on the left). Background cerebral volume is normal for age. Vascular: No hyperdense vessel. Skull and upper cervical spine: No focal marrow lesion. ACDF hardware within the partially imaged cervical spine. Sinuses/Orbits: Visualized orbits demonstrate no acute abnormality. Mild ethmoid sinus mucosal thickening. Trace bilateral mastoid effusions. IMPRESSION: No abnormal enhancement to suggest active intracranial metastatic disease. Redemonstrated encephalomalacia within the inferior cerebellar hemispheres, of uncertain etiology. Mild ethmoid sinus mucosal thickening. Trace bilateral mastoid effusions. Electronically Signed   By: Kellie Simmering DO   On: 04/05/2019 14:25    Impression/Plan: Doing well without evidence of recurrence or progression   [name] RT will arrange imaging in 18months in the form of: brain MRI. With f/u to see Dr. Saintclair Halsted after.  I'll see her after her next MRI.  She is pleased with this plan. She will continue to follow closely with medical oncology.  This encounter was provided by telemedicine platform phone as she could not access Webex.  The patient has given verbal consent for this type of encounter and has been advised to only accept a meeting of this type in a secure network environment. On date of service, in total, I spent 20 minutes on this encounter. The attendants for this meeting include Eppie Gibson  and [name].  During the encounter, Eppie Gibson was located at [facility].  [name] was located at home.    _____________________________________   Eppie Gibson, MD

## 2019-04-12 ENCOUNTER — Other Ambulatory Visit: Payer: Self-pay

## 2019-04-12 ENCOUNTER — Inpatient Hospital Stay: Payer: Medicare Other

## 2019-04-12 ENCOUNTER — Encounter: Payer: Self-pay | Admitting: Physician Assistant

## 2019-04-12 ENCOUNTER — Inpatient Hospital Stay (HOSPITAL_BASED_OUTPATIENT_CLINIC_OR_DEPARTMENT_OTHER): Payer: Medicare Other | Admitting: Physician Assistant

## 2019-04-12 ENCOUNTER — Encounter: Payer: Self-pay | Admitting: General Practice

## 2019-04-12 VITALS — BP 134/74 | HR 67 | Temp 98.2°F | Resp 18 | Ht 60.0 in | Wt 173.7 lb

## 2019-04-12 DIAGNOSIS — C7931 Secondary malignant neoplasm of brain: Secondary | ICD-10-CM | POA: Diagnosis not present

## 2019-04-12 DIAGNOSIS — C3412 Malignant neoplasm of upper lobe, left bronchus or lung: Secondary | ICD-10-CM | POA: Diagnosis not present

## 2019-04-12 DIAGNOSIS — Z95828 Presence of other vascular implants and grafts: Secondary | ICD-10-CM

## 2019-04-12 DIAGNOSIS — Z79899 Other long term (current) drug therapy: Secondary | ICD-10-CM | POA: Diagnosis not present

## 2019-04-12 DIAGNOSIS — C3492 Malignant neoplasm of unspecified part of left bronchus or lung: Secondary | ICD-10-CM | POA: Diagnosis not present

## 2019-04-12 DIAGNOSIS — R7989 Other specified abnormal findings of blood chemistry: Secondary | ICD-10-CM | POA: Diagnosis not present

## 2019-04-12 DIAGNOSIS — I25118 Atherosclerotic heart disease of native coronary artery with other forms of angina pectoris: Secondary | ICD-10-CM | POA: Diagnosis not present

## 2019-04-12 DIAGNOSIS — Z5112 Encounter for antineoplastic immunotherapy: Secondary | ICD-10-CM

## 2019-04-12 MED ORDER — SODIUM CHLORIDE 0.9 % IV SOLN
Freq: Once | INTRAVENOUS | Status: AC
  Filled 2019-04-12: qty 250

## 2019-04-12 MED ORDER — SODIUM CHLORIDE 0.9 % IV SOLN
200.0000 mg | Freq: Once | INTRAVENOUS | Status: AC
  Administered 2019-04-12: 13:00:00 200 mg via INTRAVENOUS
  Filled 2019-04-12: qty 8

## 2019-04-12 MED ORDER — HEPARIN SOD (PORK) LOCK FLUSH 100 UNIT/ML IV SOLN
500.0000 [IU] | Freq: Once | INTRAVENOUS | Status: AC | PRN
  Administered 2019-04-12: 500 [IU]
  Filled 2019-04-12: qty 5

## 2019-04-12 MED ORDER — SODIUM CHLORIDE 0.9% FLUSH
10.0000 mL | INTRAVENOUS | Status: DC | PRN
  Administered 2019-04-12: 10 mL
  Filled 2019-04-12: qty 10

## 2019-04-12 NOTE — Progress Notes (Signed)
Patient will not receive pemetrexed on 04/12/19 per Cassandra Heilingoetter, PA-C. Treatment plan has already been updated to reflect changes for today.  Leron Croak, PharmD, BCPS PGY2 Hematology/Oncology Pharmacy Resident 04/12/2019 11:33 AM

## 2019-04-12 NOTE — Progress Notes (Signed)
[facility] Spiritual Care Note  Followed up with Carolyn [name] in infusion as planned, bringing a prayer shawl and coordinating bone pillow as tangible signs of care and encouragement. Carolyn [name] was pleased to have a pastoral check-in and reports that the rough day (on which I got the referral) was luckily a one-off occasion; she is back to her upbeat self today. Prayer, Scripture, maintaining a positive attitude, and resting as needed (rather than pushing) are all coping tools that help her make meaning and manage stress/tiredness.  Carolyn [name] has my brochure and knows to contact Team whenever desired, but please also page if needs arise or circumstances change. Thank you.   [place], [place], [facility] Pager [phone] Voicemail [phone]

## 2019-04-12 NOTE — Patient Instructions (Signed)
[facility] Discharge Instructions for Patients Receiving Chemotherapy  Today you received the following chemotherapy agents: immunotherapy Keytruda  To help prevent nausea and vomiting after your treatment, we encourage you to take your nausea medication as prescribed.    If you develop nausea and vomiting that is not controlled by your nausea medication, call the clinic.   BELOW ARE SYMPTOMS THAT SHOULD BE REPORTED IMMEDIATELY:  *FEVER GREATER THAN 100.5 F  *CHILLS WITH OR WITHOUT FEVER  NAUSEA AND VOMITING THAT IS NOT CONTROLLED WITH YOUR NAUSEA MEDICATION  *UNUSUAL SHORTNESS OF BREATH  *UNUSUAL BRUISING OR BLEEDING  TENDERNESS IN MOUTH AND THROAT WITH OR WITHOUT PRESENCE OF ULCERS  *URINARY PROBLEMS  *BOWEL PROBLEMS  UNUSUAL RASH Items with * indicate a potential emergency and should be followed up as soon as possible.  Feel free to call the clinic should you have any questions or concerns. The clinic phone number is [phone].  Please show the [place] at check-in to the Emergency Department and triage nurse.

## 2019-04-13 ENCOUNTER — Telehealth: Payer: Self-pay | Admitting: Internal Medicine

## 2019-04-13 NOTE — Telephone Encounter (Signed)
Scheduled per los. Called and spoke with patient. Confirmed appts  

## 2019-04-17 ENCOUNTER — Telehealth: Payer: Self-pay | Admitting: Medical Oncology

## 2019-04-17 NOTE — Telephone Encounter (Signed)
FYI-Low back pain  3-4 days ago.She denies UTI symptoms . Pain improves   with Tylenol . I told her to monitor for now and to call for any UTI symptoms.

## 2019-04-27 NOTE — Progress Notes (Signed)
Pharmacist Chemotherapy Monitoring - Follow Up Assessment    I verify that I have reviewed each item in the below checklist:  . Regimen for the patient is scheduled for the appropriate day and plan matches scheduled date. Marland Kitchen Appropriate non-routine labs are ordered dependent on drug ordered. . If applicable, additional medications reviewed and ordered per protocol based on lifetime cumulative doses and/or treatment regimen.   Plan for follow-up and/or issues identified: Yes . I-vent associated with next due treatment: Yes . MD and/or nursing notified: No  Tora Kindred 04/27/2019 2:50 PM

## 2019-05-03 ENCOUNTER — Other Ambulatory Visit: Payer: Self-pay | Admitting: Internal Medicine

## 2019-05-03 ENCOUNTER — Encounter: Payer: Self-pay | Admitting: Internal Medicine

## 2019-05-03 ENCOUNTER — Inpatient Hospital Stay: Payer: Medicare Other

## 2019-05-03 ENCOUNTER — Other Ambulatory Visit: Payer: Self-pay

## 2019-05-03 ENCOUNTER — Inpatient Hospital Stay (HOSPITAL_BASED_OUTPATIENT_CLINIC_OR_DEPARTMENT_OTHER): Payer: Medicare Other | Admitting: Internal Medicine

## 2019-05-03 VITALS — BP 128/65 | HR 65 | Temp 98.7°F | Resp 18 | Ht 60.0 in | Wt 172.5 lb

## 2019-05-03 DIAGNOSIS — C7931 Secondary malignant neoplasm of brain: Secondary | ICD-10-CM | POA: Diagnosis not present

## 2019-05-03 DIAGNOSIS — R5382 Chronic fatigue, unspecified: Secondary | ICD-10-CM

## 2019-05-03 DIAGNOSIS — C3490 Malignant neoplasm of unspecified part of unspecified bronchus or lung: Secondary | ICD-10-CM

## 2019-05-03 DIAGNOSIS — Z79899 Other long term (current) drug therapy: Secondary | ICD-10-CM | POA: Diagnosis not present

## 2019-05-03 DIAGNOSIS — I10 Essential (primary) hypertension: Secondary | ICD-10-CM | POA: Diagnosis not present

## 2019-05-03 DIAGNOSIS — C3492 Malignant neoplasm of unspecified part of left bronchus or lung: Secondary | ICD-10-CM

## 2019-05-03 DIAGNOSIS — C3412 Malignant neoplasm of upper lobe, left bronchus or lung: Secondary | ICD-10-CM | POA: Diagnosis not present

## 2019-05-03 DIAGNOSIS — Z5111 Encounter for antineoplastic chemotherapy: Secondary | ICD-10-CM

## 2019-05-03 DIAGNOSIS — Z5112 Encounter for antineoplastic immunotherapy: Secondary | ICD-10-CM | POA: Diagnosis not present

## 2019-05-03 DIAGNOSIS — I25118 Atherosclerotic heart disease of native coronary artery with other forms of angina pectoris: Secondary | ICD-10-CM | POA: Diagnosis not present

## 2019-05-03 LAB — CBC WITH DIFFERENTIAL (CANCER CENTER ONLY)
Abs Immature Granulocytes: 0.01 10*3/uL (ref 0.00–0.07)
Basophils Absolute: 0 10*3/uL (ref 0.0–0.1)
Basophils Relative: 1 %
Eosinophils Absolute: 0.2 10*3/uL (ref 0.0–0.5)
Eosinophils Relative: 4 %
HCT: 31.7 % — ABNORMAL LOW (ref 36.0–46.0)
Hemoglobin: 9.7 g/dL — ABNORMAL LOW (ref 12.0–15.0)
Immature Granulocytes: 0 %
Lymphocytes Relative: 24 %
Lymphs Abs: 1.3 10*3/uL (ref 0.7–4.0)
MCH: 26.4 pg (ref 26.0–34.0)
MCHC: 30.6 g/dL (ref 30.0–36.0)
MCV: 86.4 fL (ref 80.0–100.0)
Monocytes Absolute: 0.6 10*3/uL (ref 0.1–1.0)
Monocytes Relative: 10 %
Neutro Abs: 3.4 10*3/uL (ref 1.7–7.7)
Neutrophils Relative %: 61 %
Platelet Count: 264 10*3/uL (ref 150–400)
RBC: 3.67 MIL/uL — ABNORMAL LOW (ref 3.87–5.11)
RDW: 14.9 % (ref 11.5–15.5)
WBC Count: 5.6 10*3/uL (ref 4.0–10.5)
nRBC: 0 % (ref 0.0–0.2)

## 2019-05-03 LAB — CMP (CANCER CENTER ONLY)
ALT: 13 U/L (ref 0–44)
AST: 25 U/L (ref 15–41)
Albumin: 3.6 g/dL (ref 3.5–5.0)
Alkaline Phosphatase: 70 U/L (ref 38–126)
Anion gap: 13 (ref 5–15)
BUN: 13 mg/dL (ref 8–23)
CO2: 26 mmol/L (ref 22–32)
Calcium: 9.3 mg/dL (ref 8.9–10.3)
Chloride: 103 mmol/L (ref 98–111)
Creatinine: 1.74 mg/dL — ABNORMAL HIGH (ref 0.44–1.00)
GFR, Est AFR Am: 35 mL/min — ABNORMAL LOW (ref >60)
GFR, Estimated: 30 mL/min — ABNORMAL LOW (ref >60)
Glucose, Bld: 116 mg/dL — ABNORMAL HIGH (ref 70–99)
Potassium: 3.5 mmol/L (ref 3.5–5.1)
Sodium: 142 mmol/L (ref 135–145)
Total Bilirubin: 0.3 mg/dL (ref 0.3–1.2)
Total Protein: 7.4 g/dL (ref 6.5–8.1)

## 2019-05-03 LAB — TSH: TSH: 2.085 u[IU]/mL (ref 0.308–3.960)

## 2019-05-03 MED ORDER — SODIUM CHLORIDE 0.9% FLUSH
10.0000 mL | INTRAVENOUS | Status: DC | PRN
  Administered 2019-05-03: 10 mL
  Filled 2019-05-03: qty 10

## 2019-05-03 MED ORDER — PROCHLORPERAZINE MALEATE 10 MG PO TABS: 10.0000 mg | ORAL_TABLET | Freq: Once | ORAL | Status: DC

## 2019-05-03 MED ORDER — SODIUM CHLORIDE 0.9 % IV SOLN
Freq: Once | INTRAVENOUS | Status: AC
  Filled 2019-05-03: qty 250

## 2019-05-03 MED ORDER — SODIUM CHLORIDE 0.9 % IV SOLN
200.0000 mg | Freq: Once | INTRAVENOUS | Status: AC
  Administered 2019-05-03: 200 mg via INTRAVENOUS
  Filled 2019-05-03: qty 8

## 2019-05-03 MED ORDER — HEPARIN SOD (PORK) LOCK FLUSH 100 UNIT/ML IV SOLN
500.0000 [IU] | Freq: Once | INTRAVENOUS | Status: AC | PRN
  Administered 2019-05-03: 500 [IU]
  Filled 2019-05-03: qty 5

## 2019-05-03 NOTE — Patient Instructions (Signed)
[facility] Discharge Instructions for Patients Receiving Chemotherapy  Today you received the following chemotherapy agents: immunotherapy Keytruda  To help prevent nausea and vomiting after your treatment, we encourage you to take your nausea medication as prescribed.    If you develop nausea and vomiting that is not controlled by your nausea medication, call the clinic.   BELOW ARE SYMPTOMS THAT SHOULD BE REPORTED IMMEDIATELY:  *FEVER GREATER THAN 100.5 F  *CHILLS WITH OR WITHOUT FEVER  NAUSEA AND VOMITING THAT IS NOT CONTROLLED WITH YOUR NAUSEA MEDICATION  *UNUSUAL SHORTNESS OF BREATH  *UNUSUAL BRUISING OR BLEEDING  TENDERNESS IN MOUTH AND THROAT WITH OR WITHOUT PRESENCE OF ULCERS  *URINARY PROBLEMS  *BOWEL PROBLEMS  UNUSUAL RASH Items with * indicate a potential emergency and should be followed up as soon as possible.  Feel free to call the clinic should you have any questions or concerns. The clinic phone number is [phone].  Please show the [place] at check-in to the Emergency Department and triage nurse.

## 2019-05-03 NOTE — Progress Notes (Signed)
[place] [phone]   [phone]  OFFICE PROGRESS NOTE  [name], MD [address] [address]  DIAGNOSIS: Stage IV (T1c, N1, M1c ) non-small cell lung cancer, adenocarcinoma diagnosed in July 2020 and presented with left upper lobe lung nodule in addition to right upper lobe lung nodule with left hilar lymphadenopathy as well as multiple brain metastasis.  Biomarker Findings Microsatellite status - MS-Stable Tumor Mutational Burden - 4 Muts/Mb Genomic Findings For a complete list of the genes assayed, please refer to the Appendix. ATM G204* KRAS G12C PDGFRA P122T SMO R199W 7 Disease relevant genes with no reportable alterations: ALK, BRAF, EGFR, ERBB2, MET, RET, ROS1  PDL 1 expression was 1%  PRIOR THERAPY: SBRT to multiple brain metastasis under the care of Dr. Isidore Moos.  CURRENT THERAPY: Systemic chemotherapy with carboplatin for AUC of 5, Alimta 500 mg/M2 and Keytruda 200 mg IV every 3 weeks.  First dose October 05, 2018.  Status post 10 cycles.  Starting from cycle #5 the patient will be on treatment with maintenance Alimta 500 mg/M2 and Keytruda 200 mg IV every 3 weeks.  INTERVAL HISTORY: Carolyn [name] 64 y.o. female returns to the clinic today for follow-up visit.  The patient is feeling fine today with no concerning complaints except for intermittent dizzy spells.  She was started recently by her primary care physician on Coreg but discontinued it after 2 days because of worsening dizzy spells.  She denied having any current chest pain, shortness of breath, cough or hemoptysis.  She denied having any fever or chills.  She has no nausea, vomiting, diarrhea or constipation.  She denied having any headache or visual changes.  She is here today for evaluation before starting cycle #11 of her treatment.    MEDICAL HISTORY: Past Medical History:  Diagnosis Date  . Anginal pain ([place])    " with exertion "   . Arthritis    " 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 "  . Asthma    h/o  . Coronary artery disease   . Diabetes mellitus without complication ([place])    Type II  . GERD (gastroesophageal reflux disease)   . H/O hiatal hernia   . Heart murmur   . History of radiation therapy 09/22/2018   stereotactic radiosurgery   . 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 Hypertension   . nscl ca dx'd 08/07/18  . Persistent headaches    h/o  . Pneumonia    hx of PNA  . Shortness of breath   . Vaginal dryness    h/o    ALLERGIES:  is allergic to lisinopril.  MEDICATIONS:  Current Outpatient Medications  Medication Sig Dispense Refill  . acetaminophen (TYLENOL) 500 MG tablet Take 1,000 mg by mouth every 8 (eight) hours as needed for mild pain, fever or headache.     Marland Kitchen amLODipine (NORVASC) 5 MG tablet Take 5 mg by mouth at bedtime.     Marland Kitchen aspirin EC 81 MG tablet Take 81 mg by mouth daily.    . cetirizine (ZYRTEC) 10 MG tablet Take 10 mg by mouth daily.    . Cholecalciferol (VITAMIN D-3) 125 MCG (5000 UT) TABS Take 5,000 Units by mouth daily.    . Chromium Picolinate 1000 MCG TABS Take 1,000 mg by mouth daily.    Marland Kitchen esomeprazole (NEXIUM) 20 MG capsule Take 20 mg by mouth daily at 12 noon.    . folic acid (  FOLVITE) 1 MG tablet Take 1 tablet (1 mg total) by mouth daily. 30 tablet 2  . glimepiride (AMARYL) 1 MG tablet Take 0.5 mg by mouth daily as needed.    . hydrochlorothiazide (MICROZIDE) 12.5 MG capsule Take 12.5 mg by mouth daily.    Marland Kitchen lidocaine-prilocaine (EMLA) cream Apply to the Port-A-Cath site 30 to 60 minutes before chemotherapy. 30 g 2  . metFORMIN (GLUCOPHAGE) 500 MG tablet Take 500 mg by mouth 2 (two) times daily with a meal.     . methocarbamol (ROBAXIN) 500 MG tablet Take 1 tablet (500 mg total) by mouth every 6 (six) hours as needed for muscle spasms. 21 tablet 0  . ONETOUCH ULTRA test strip USE 1 STRIP TO CHECK GLUCOSE THREE TIMES DAILY    . Pitavastatin Calcium (LIVALO) 4 MG TABS Take 4 mg by mouth at bedtime.     Vladimir Faster Glycol-Propyl Glycol (SYSTANE) 0.4-0.3 % SOLN Place 1 drop into both eyes every 6 (six) hours as needed (dry eyes).     . prochlorperazine (COMPAZINE) 10 MG tablet Take 1 tablet (10 mg total) by mouth every 6 (six) hours as needed for nausea or vomiting. 30 tablet 2  . psyllium (METAMUCIL) 58.6 % packet Take 1 packet by mouth at bedtime as needed (constipation).     Marland Kitchen telmisartan (MICARDIS) 40 MG tablet Take 40 mg by mouth daily.    Marland Kitchen zolpidem (AMBIEN) 10 MG tablet Take 2.5 mg by mouth at bedtime.     . carvedilol (COREG) 25 MG tablet Take 12.5 mg by mouth 2 (two) times daily with a meal.     . nitroGLYCERIN (NITROSTAT) 0.4 MG SL tablet Place 1 tablet (0.4 mg total) under the tongue every 5 (five) minutes as needed for chest pain. (Patient not taking: Reported on 03/22/2019) 25 tablet 2   No current facility-administered medications for this visit.    SURGICAL HISTORY:  Past Surgical History:  Procedure Laterality Date  . ABDOMINAL HYSTERECTOMY    . bone spur    . CARDIAC CATHETERIZATION  06/13/2012  . carpel tunnel surgery Left   . COLONOSCOPY     9 years ago  . CORONARY ANGIOPLASTY  06/13/2012  . EYE SURGERY     cyst left eye   . IR IMAGING GUIDED PORT INSERTION  10/10/2018  . LEFT HEART CATHETERIZATION WITH CORONARY ANGIOGRAM N/A 06/13/2012   Procedure: LEFT HEART CATHETERIZATION WITH CORONARY ANGIOGRAM;  Surgeon: Laverda Page, MD;  Location: [facility] CATH LAB;  Service: Cardiovascular;  Laterality: N/A;  . NECK SURGERY    . rotator cuff surgery Right 2015  . VIDEO BRONCHOSCOPY WITH ENDOBRONCHIAL NAVIGATION N/A 09/06/2018   Procedure: VIDEO BRONCHOSCOPY WITH ENDOBRONCHIAL NAVIGATION, left upper lung;  Surgeon: Lajuana Matte, MD;  Location: [facility];  Service: Thoracic;  Laterality: N/A;  . VIDEO BRONCHOSCOPY WITH ENDOBRONCHIAL ULTRASOUND Left 09/06/2018   Procedure: VIDEO BRONCHOSCOPY WITH ENDOBRONCHIAL ULTRASOUND, left lung;  Surgeon: Lajuana Matte, MD;  Location: [facility];   Service: Thoracic;  Laterality: Left;  . WISDOM TOOTH EXTRACTION      REVIEW OF SYSTEMS:  A comprehensive review of systems was negative except for: Constitutional: positive for fatigue Neurological: positive for dizziness   PHYSICAL EXAMINATION: General appearance: alert, cooperative and no distress Head: Normocephalic, without obvious abnormality, atraumatic Neck: no adenopathy, no JVD, supple, symmetrical, trachea midline and thyroid not enlarged, symmetric, no tenderness/mass/nodules Lymph nodes: Cervical, supraclavicular, and axillary nodes normal. Resp: clear to auscultation bilaterally Back: symmetric, no  curvature. ROM normal. No CVA tenderness. Cardio: regular rate and rhythm, S1, S2 normal, no murmur, click, rub or gallop GI: soft, non-tender; bowel sounds normal; no masses,  no organomegaly Extremities: extremities normal, atraumatic, no cyanosis or edema  ECOG PERFORMANCE STATUS: 1 - Symptomatic but completely ambulatory  Blood pressure 128/65, pulse 65, temperature 98.7 F (37.1 C), temperature source Oral, resp. rate 18, height 5' (1.524 m), weight 172 lb 8 oz (78.2 kg), SpO2 100 %.  LABORATORY DATA: Lab Results  Component Value Date   WBC 5.6 05/03/2019   HGB 9.7 (L) 05/03/2019   HCT 31.7 (L) 05/03/2019   MCV 86.4 05/03/2019   PLT 264 05/03/2019      Chemistry      Component Value Date/Time   NA 142 04/10/2019 0933   K 3.7 04/10/2019 0933   CL 102 04/10/2019 0933   CO2 29 04/10/2019 0933   BUN 14 04/10/2019 0933   CREATININE 1.72 (H) 04/10/2019 0933      Component Value Date/Time   CALCIUM 9.5 04/10/2019 0933   ALKPHOS 72 04/10/2019 0933   AST 33 04/10/2019 0933   ALT 13 04/10/2019 0933   BILITOT 0.3 04/10/2019 0933       RADIOGRAPHIC STUDIES: CT ABDOMEN PELVIS WO CONTRAST  Result Date: 04/10/2019 CLINICAL DATA:  Adenocarcinoma of the lung. Restaging. EXAM: CT CHEST, ABDOMEN AND PELVIS WITHOUT CONTRAST TECHNIQUE: Multidetector CT imaging of the  chest, abdomen and pelvis was performed following the standard protocol without IV contrast. COMPARISON:  02/07/2019 FINDINGS: CT CHEST FINDINGS Cardiovascular: There is a small pericardial effusion which is new from previous exam, image 40/2. Aortic atherosclerosis. Three vessel coronary artery calcifications. Mediastinum/Nodes: No enlarged mediastinal, hilar, or axillary lymph nodes. Thyroid gland, trachea, and esophagus demonstrate no significant findings. Lungs/Pleura: No pleural effusion identified. No airspace consolidation, atelectasis or pneumothorax. -index subpleural right upper lobe pulmonary nodule measures 8 mm, image 42/6. Previously 7 mm. -index right lower lobe subpleural nodule measures 4 mm, image 66/3. -index spiculated posterior left upper lobe lung nodule measures 9 mm, image 47/6. Stable. -Faint 4 mm lung nodule in the superior segment of left lower lobe is unchanged, image 61/6. No new or progressive nodules. Musculoskeletal: No chest wall mass or suspicious bone lesions identified. CT ABDOMEN PELVIS FINDINGS Hepatobiliary: No focal liver abnormality is seen. No gallstones, gallbladder wall thickening, or biliary dilatation. Pancreas: Unremarkable. No pancreatic ductal dilatation or surrounding inflammatory changes. Spleen: Normal in size without focal abnormality. Adrenals/Urinary Tract: Normal appearance of the adrenal glands. No kidney mass or hydronephrosis. Stomach/Bowel: Stomach is within normal limits. Appendix appears normal. No evidence of bowel wall thickening, distention, or inflammatory changes. Vascular/Lymphatic: Aortic atherosclerosis. No abdominopelvic adenopathy. Reproductive: Status post hysterectomy. No adnexal masses. Other: No free fluid or fluid collections. Musculoskeletal: No acute or significant osseous findings. Anterolisthesis of L4 on L5 is identified measuring 0.9 cm. Bilateral facet hypertrophy and degenerative change. IMPRESSION: 1. No specific findings  identified to suggest recurrent or metastatic disease within the chest, abdomen or pelvis. 2. Bilateral pulmonary nodules are again noted. These are not significantly changed in the interval. 3. New small pericardial effusion. 4. Aortic atherosclerosis and 3 vessel coronary artery calcifications. 5. Anterolisthesis of L4 on L5. Aortic Atherosclerosis (ICD10-I70.0). Electronically Signed   By: Kerby Moors M.D.   On: 04/10/2019 13:34   CT CHEST WO CONTRAST  Result Date: 04/10/2019 CLINICAL DATA:  Adenocarcinoma of the lung. Restaging. EXAM: CT CHEST, ABDOMEN AND PELVIS WITHOUT CONTRAST TECHNIQUE: Multidetector CT imaging  of the chest, abdomen and pelvis was performed following the standard protocol without IV contrast. COMPARISON:  02/07/2019 FINDINGS: CT CHEST FINDINGS Cardiovascular: There is a small pericardial effusion which is new from previous exam, image 40/2. Aortic atherosclerosis. Three vessel coronary artery calcifications. Mediastinum/Nodes: No enlarged mediastinal, hilar, or axillary lymph nodes. Thyroid gland, trachea, and esophagus demonstrate no significant findings. Lungs/Pleura: No pleural effusion identified. No airspace consolidation, atelectasis or pneumothorax. -index subpleural right upper lobe pulmonary nodule measures 8 mm, image 42/6. Previously 7 mm. -index right lower lobe subpleural nodule measures 4 mm, image 66/3. -index spiculated posterior left upper lobe lung nodule measures 9 mm, image 47/6. Stable. -Faint 4 mm lung nodule in the superior segment of left lower lobe is unchanged, image 61/6. No new or progressive nodules. Musculoskeletal: No chest wall mass or suspicious bone lesions identified. CT ABDOMEN PELVIS FINDINGS Hepatobiliary: No focal liver abnormality is seen. No gallstones, gallbladder wall thickening, or biliary dilatation. Pancreas: Unremarkable. No pancreatic ductal dilatation or surrounding inflammatory changes. Spleen: Normal in size without focal abnormality.  Adrenals/Urinary Tract: Normal appearance of the adrenal glands. No kidney mass or hydronephrosis. Stomach/Bowel: Stomach is within normal limits. Appendix appears normal. No evidence of bowel wall thickening, distention, or inflammatory changes. Vascular/Lymphatic: Aortic atherosclerosis. No abdominopelvic adenopathy. Reproductive: Status post hysterectomy. No adnexal masses. Other: No free fluid or fluid collections. Musculoskeletal: No acute or significant osseous findings. Anterolisthesis of L4 on L5 is identified measuring 0.9 cm. Bilateral facet hypertrophy and degenerative change. IMPRESSION: 1. No specific findings identified to suggest recurrent or metastatic disease within the chest, abdomen or pelvis. 2. Bilateral pulmonary nodules are again noted. These are not significantly changed in the interval. 3. New small pericardial effusion. 4. Aortic atherosclerosis and 3 vessel coronary artery calcifications. 5. Anterolisthesis of L4 on L5. Aortic Atherosclerosis (ICD10-I70.0). Electronically Signed   By: Kerby Moors M.D.   On: 04/10/2019 13:34   MR Brain W Wo Contrast  Result Date: 04/05/2019 CLINICAL DATA:  Brain metastasis. Follow-up treatment; brain/CNS neoplasm, surveillance. 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 EXAM: MRI HEAD WITHOUT AND WITH CONTRAST TECHNIQUE: Multiplanar, multiecho pulse sequences of the brain and surrounding structures were obtained without and with intravenous contrast. CONTRAST:  62m MULTIHANCE GADOBENATE DIMEGLUMINE 529 MG/ML IV SOLN COMPARISON:  Brain MRI 12/22/2018 FINDINGS: Brain: No abnormal enhancement is demonstrated to suggest active intracranial metastatic disease. There is no evidence of acute infarct. No midline shift or extra-axial fluid collection. No chronic intracranial blood products. A few punctate nonenhancing T2/FLAIR hyperintense white matter foci  within the bilateral cerebral hemispheres are unchanged. Redemonstrated encephalomalacia within the inferior cerebellar hemispheres (greater on the left). Background cerebral volume is normal for age. Vascular: No hyperdense vessel. Skull and upper cervical spine: No focal marrow lesion. ACDF hardware within the partially imaged cervical spine. Sinuses/Orbits: Visualized orbits demonstrate no acute abnormality. Mild ethmoid sinus mucosal thickening. Trace bilateral mastoid effusions. IMPRESSION: No abnormal enhancement to suggest active intracranial metastatic disease. Redemonstrated encephalomalacia within the inferior cerebellar hemispheres, of uncertain etiology. Mild ethmoid sinus mucosal thickening. Trace bilateral mastoid effusions. Electronically Signed   By: KKellie SimmeringDO   On: 04/05/2019 14:25    ASSESSMENT AND PLAN: This is a very pleasant 64years old African-American female with likely stage IV (T1c, N1, M1C) lung cancer pending tissue diagnosis and presented with left upper lobe lung nodule in addition to right upper lobe pulmonary nodule as well  as left hilar adenopathy and metastatic lesion to the brain. Molecular studies showed no actionable mutation and PDL 1 expression was 1%. The patient underwent SBRT to the metastatic brain lesion under the care of Dr. Isidore Moos. The patient is currently undergoing systemic chemotherapy with carboplatin for AUC of 5, Alimta 500 mg/M2 and Keytruda 200 mg IV every 3 weeks is status post 10 cycles.  Starting from cycle #5 she is on maintenance treatment with Alimta and Keytruda every 3 weeks. I recommended for the patient to proceed with cycle #11 of her treatment with [facility] today as planned.  We will make a decision regarding resuming her treatment with Alimta based on the kidney function which is currently pending. I will see her back for follow-up visit in 3 weeks for evaluation before starting cycle #12. For the anemia, she will continue on the oral  iron tablets for now. The patient was advised to call immediately if she has any concerning symptoms in the interval.  The patient voices understanding of current disease status and treatment options and is in agreement with the current care plan. All questions were answered. The patient knows to call the clinic with any problems, questions or concerns. We can certainly see the patient much sooner if necessary.  Disclaimer: This note was dictated with voice recognition software. Similar sounding words can inadvertently be transcribed and may not be corrected upon review.

## 2019-05-03 NOTE — Progress Notes (Signed)
Since no Alimta, no need for Compazine premed.  Pt aware and agrees. Compazine order d/c'd.  Kennith Center, Pharm.D., CPP 05/03/2019@1 :18 PM

## 2019-05-03 NOTE — Progress Notes (Signed)
Pt's creatnine is 1.74 today, Dr. Julien Nordmann informed, no alimta today.

## 2019-05-04 ENCOUNTER — Telehealth: Payer: Self-pay | Admitting: Internal Medicine

## 2019-05-04 NOTE — Telephone Encounter (Signed)
Scheduled per los. Called and left msg. Mailed printout  °

## 2019-05-09 ENCOUNTER — Other Ambulatory Visit: Payer: Self-pay | Admitting: Radiation Therapy

## 2019-05-09 DIAGNOSIS — C7931 Secondary malignant neoplasm of brain: Secondary | ICD-10-CM

## 2019-05-10 ENCOUNTER — Telehealth: Payer: Self-pay | Admitting: Radiation Therapy

## 2019-05-10 ENCOUNTER — Telehealth: Payer: Self-pay | Admitting: Medical Oncology

## 2019-05-10 NOTE — Telephone Encounter (Signed)
Left a detailed message on her home voicemail about the upcoming brain MRI in May and follow-up visit with Dr. Mickeal Skinner.   [name] R.T.(R)(T) Radiation Special Procedures Navigator

## 2019-05-10 NOTE — Telephone Encounter (Signed)
appt

## 2019-05-18 ENCOUNTER — Other Ambulatory Visit: Payer: Self-pay | Admitting: Radiation Therapy

## 2019-05-18 DIAGNOSIS — C7931 Secondary malignant neoplasm of brain: Secondary | ICD-10-CM

## 2019-05-18 NOTE — Progress Notes (Signed)
Orders placed for port access at GI the day of her brain MRI.   [name] R.T.(R)(T) Radiation Special Procedures Navigator

## 2019-05-21 ENCOUNTER — Telehealth: Payer: Self-pay | Admitting: Physician Assistant

## 2019-05-21 NOTE — Progress Notes (Signed)
Pharmacist Chemotherapy Monitoring - Follow Up Assessment    I verify that I have reviewed each item in the below checklist:  . Regimen for the patient is scheduled for the appropriate day and plan matches scheduled date. Marland Kitchen Appropriate non-routine labs are ordered dependent on drug ordered. . If applicable, additional medications reviewed and ordered per protocol based on lifetime cumulative doses and/or treatment regimen.   Plan for follow-up and/or issues identified: Yes . I-vent associated with next due treatment: Yes . MD and/or nursing notified: No  Romualdo Bolk [facility] 05/21/2019 4:21 PM

## 2019-05-21 NOTE — Telephone Encounter (Signed)
R/s appt per 4/9 sch message- pt aware of new appt

## 2019-05-24 ENCOUNTER — Other Ambulatory Visit: Payer: Self-pay

## 2019-05-24 ENCOUNTER — Inpatient Hospital Stay: Payer: Medicare Other | Attending: Physician Assistant | Admitting: Internal Medicine

## 2019-05-24 ENCOUNTER — Encounter: Payer: Self-pay | Admitting: Internal Medicine

## 2019-05-24 ENCOUNTER — Inpatient Hospital Stay: Payer: Medicare Other

## 2019-05-24 VITALS — BP 144/72 | HR 62 | Temp 98.5°F | Resp 17 | Ht 60.0 in | Wt 172.5 lb

## 2019-05-24 DIAGNOSIS — C3490 Malignant neoplasm of unspecified part of unspecified bronchus or lung: Secondary | ICD-10-CM

## 2019-05-24 DIAGNOSIS — I10 Essential (primary) hypertension: Secondary | ICD-10-CM | POA: Diagnosis not present

## 2019-05-24 DIAGNOSIS — C7931 Secondary malignant neoplasm of brain: Secondary | ICD-10-CM | POA: Diagnosis not present

## 2019-05-24 DIAGNOSIS — Z79899 Other long term (current) drug therapy: Secondary | ICD-10-CM | POA: Diagnosis not present

## 2019-05-24 DIAGNOSIS — C3412 Malignant neoplasm of upper lobe, left bronchus or lung: Secondary | ICD-10-CM | POA: Insufficient documentation

## 2019-05-24 DIAGNOSIS — R5382 Chronic fatigue, unspecified: Secondary | ICD-10-CM

## 2019-05-24 DIAGNOSIS — Z5111 Encounter for antineoplastic chemotherapy: Secondary | ICD-10-CM | POA: Diagnosis not present

## 2019-05-24 DIAGNOSIS — Z5112 Encounter for antineoplastic immunotherapy: Secondary | ICD-10-CM | POA: Insufficient documentation

## 2019-05-24 DIAGNOSIS — I25118 Atherosclerotic heart disease of native coronary artery with other forms of angina pectoris: Secondary | ICD-10-CM | POA: Diagnosis not present

## 2019-05-24 DIAGNOSIS — C3492 Malignant neoplasm of unspecified part of left bronchus or lung: Secondary | ICD-10-CM

## 2019-05-24 DIAGNOSIS — Z95828 Presence of other vascular implants and grafts: Secondary | ICD-10-CM

## 2019-05-24 LAB — TSH: TSH: 1.891 u[IU]/mL (ref 0.308–3.960)

## 2019-05-24 LAB — CBC WITH DIFFERENTIAL (CANCER CENTER ONLY)
Abs Immature Granulocytes: 0.01 10*3/uL (ref 0.00–0.07)
Basophils Absolute: 0 10*3/uL (ref 0.0–0.1)
Basophils Relative: 1 %
Eosinophils Absolute: 0.3 10*3/uL (ref 0.0–0.5)
Eosinophils Relative: 4 %
HCT: 33.2 % — ABNORMAL LOW (ref 36.0–46.0)
Hemoglobin: 10 g/dL — ABNORMAL LOW (ref 12.0–15.0)
Immature Granulocytes: 0 %
Lymphocytes Relative: 27 %
Lymphs Abs: 1.6 10*3/uL (ref 0.7–4.0)
MCH: 25.7 pg — ABNORMAL LOW (ref 26.0–34.0)
MCHC: 30.1 g/dL (ref 30.0–36.0)
MCV: 85.3 fL (ref 80.0–100.0)
Monocytes Absolute: 0.6 10*3/uL (ref 0.1–1.0)
Monocytes Relative: 10 %
Neutro Abs: 3.4 10*3/uL (ref 1.7–7.7)
Neutrophils Relative %: 58 %
Platelet Count: 254 10*3/uL (ref 150–400)
RBC: 3.89 MIL/uL (ref 3.87–5.11)
RDW: 14.6 % (ref 11.5–15.5)
WBC Count: 5.9 10*3/uL (ref 4.0–10.5)
nRBC: 0 % (ref 0.0–0.2)

## 2019-05-24 LAB — CMP (CANCER CENTER ONLY)
ALT: 10 U/L (ref 0–44)
AST: 21 U/L (ref 15–41)
Albumin: 3.5 g/dL (ref 3.5–5.0)
Alkaline Phosphatase: 71 U/L (ref 38–126)
Anion gap: 10 (ref 5–15)
BUN: 19 mg/dL (ref 8–23)
CO2: 27 mmol/L (ref 22–32)
Calcium: 9.3 mg/dL (ref 8.9–10.3)
Chloride: 104 mmol/L (ref 98–111)
Creatinine: 1.88 mg/dL — ABNORMAL HIGH (ref 0.44–1.00)
GFR, Est AFR Am: 32 mL/min — ABNORMAL LOW (ref >60)
GFR, Estimated: 28 mL/min — ABNORMAL LOW (ref >60)
Glucose, Bld: 118 mg/dL — ABNORMAL HIGH (ref 70–99)
Potassium: 3.7 mmol/L (ref 3.5–5.1)
Sodium: 141 mmol/L (ref 135–145)
Total Bilirubin: 0.3 mg/dL (ref 0.3–1.2)
Total Protein: 7.5 g/dL (ref 6.5–8.1)

## 2019-05-24 MED ORDER — CYANOCOBALAMIN 1000 MCG/ML IJ SOLN
1000.0000 ug | Freq: Once | INTRAMUSCULAR | Status: AC
  Administered 2019-05-24: 1000 ug via INTRAMUSCULAR

## 2019-05-24 MED ORDER — CYANOCOBALAMIN 1000 MCG/ML IJ SOLN
INTRAMUSCULAR | Status: AC
  Filled 2019-05-24: qty 1

## 2019-05-24 MED ORDER — PROCHLORPERAZINE MALEATE 10 MG PO TABS
ORAL_TABLET | ORAL | Status: AC
  Filled 2019-05-24: qty 1

## 2019-05-24 MED ORDER — SODIUM CHLORIDE 0.9% FLUSH
10.0000 mL | INTRAVENOUS | Status: DC | PRN
  Administered 2019-05-24: 10 mL
  Filled 2019-05-24: qty 10

## 2019-05-24 MED ORDER — SODIUM CHLORIDE 0.9 % IV SOLN
Freq: Once | INTRAVENOUS | Status: AC
  Filled 2019-05-24: qty 250

## 2019-05-24 MED ORDER — HEPARIN SOD (PORK) LOCK FLUSH 100 UNIT/ML IV SOLN
500.0000 [IU] | Freq: Once | INTRAVENOUS | Status: AC | PRN
  Administered 2019-05-24: 500 [IU]
  Filled 2019-05-24: qty 5

## 2019-05-24 MED ORDER — CYANOCOBALAMIN 1000 MCG/ML IJ SOLN: 1000.0000 ug | Freq: Once | INTRAMUSCULAR | Status: DC

## 2019-05-24 MED ORDER — SODIUM CHLORIDE 0.9 % IV SOLN
200.0000 mg | Freq: Once | INTRAVENOUS | Status: AC
  Administered 2019-05-24: 200 mg via INTRAVENOUS
  Filled 2019-05-24: qty 8

## 2019-05-24 MED ORDER — PROCHLORPERAZINE MALEATE 10 MG PO TABS: 10.0000 mg | ORAL_TABLET | Freq: Once | ORAL | Status: DC

## 2019-05-24 NOTE — Progress Notes (Signed)
Carolyn [name] [phone]   [phone]  OFFICE PROGRESS NOTE  [name], MD [address] [place] [address]  DIAGNOSIS: Stage IV (T1c, N1, M1c ) non-small cell lung cancer, adenocarcinoma diagnosed in July 2020 and presented with left upper lobe lung nodule in addition to right upper lobe lung nodule with left hilar lymphadenopathy as well as multiple brain metastasis.  Biomarker Findings Microsatellite status - MS-Stable Tumor Mutational Burden - 4 Muts/Mb Genomic Findings For a complete list of the genes assayed, please refer to the Appendix. ATM G204* KRAS G12C PDGFRA P122T SMO R199W 7 Disease relevant genes with no reportable alterations: ALK, BRAF, EGFR, ERBB2, MET, RET, ROS1  PDL 1 expression was 1%  PRIOR THERAPY: SBRT to multiple brain metastasis under the care of Dr. Isidore Moos.  CURRENT THERAPY: Systemic chemotherapy with carboplatin for AUC of 5, Alimta 500 mg/M2 and Keytruda 200 mg IV every 3 weeks.  First dose October 05, 2018.  Status post 10 cycles.  Starting from cycle #5 the patient will be on treatment with maintenance Alimta 500 mg/M2 and Keytruda 200 mg IV every 3 weeks.  She has been on treatment with single agent Keytruda recently secondary to renal insufficiency.  INTERVAL HISTORY: Carolyn [name] 64 y.o. female returns to the clinic today for follow-up visit.  The patient is feeling fine today with no concerning complaints except for mild fatigue.  She denied having any current chest pain, shortness of breath, cough or hemoptysis.  She denied having any fever or chills.  She has no nausea, vomiting, diarrhea or constipation.  She has no headache or visual changes.  She continues to tolerate her treatment well.  She has been on treatment with only single agent Keytruda secondary to renal insufficiency.  The patient is here today for evaluation before starting cycle #12 of her treatment.  MEDICAL  HISTORY: Past Medical History:  Diagnosis Date  . Anginal pain ([place])    " with exertion "  . Arthritis    " 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 "  . Asthma    h/o  . Coronary artery disease   . Diabetes mellitus without complication ([facility])    Type II  . GERD (gastroesophageal reflux disease)   . H/O hiatal hernia   . Heart murmur   . History of radiation therapy 09/22/2018   stereotactic radiosurgery   . 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 Hypertension   . nscl ca dx'd 08/07/18  . Persistent headaches    h/o  . Pneumonia    hx of PNA  . Shortness of breath   . Vaginal dryness    h/o    ALLERGIES:  is allergic to lisinopril.  MEDICATIONS:  Current Outpatient Medications  Medication Sig Dispense Refill  . acetaminophen (TYLENOL) 500 MG tablet Take 1,000 mg by mouth every 8 (eight) hours as needed for mild pain, fever or headache.     Marland Kitchen amLODipine (NORVASC) 5 MG tablet Take 5 mg by mouth at bedtime.     Marland Kitchen aspirin EC 81 MG tablet Take 81 mg by mouth daily.    . carvedilol (COREG) 25 MG tablet Take 12.5 mg by mouth 2 (two) times daily with a meal.     . cetirizine (ZYRTEC) 10 MG tablet Take 10 mg by mouth daily.    . Cholecalciferol (VITAMIN D-3) 125 MCG (5000 UT) TABS Take 5,000 Units by mouth daily.    . Chromium  Picolinate 1000 MCG TABS Take 1,000 mg by mouth daily.    Marland Kitchen esomeprazole (NEXIUM) 20 MG capsule Take 20 mg by mouth daily at 12 noon.    . folic acid (FOLVITE) 1 MG tablet Take 1 tablet (1 mg total) by mouth daily. 30 tablet 2  . glimepiride (AMARYL) 1 MG tablet Take 0.5 mg by mouth daily as needed.    . hydrochlorothiazide (MICROZIDE) 12.5 MG capsule Take 12.5 mg by mouth daily.    Marland Kitchen lidocaine-prilocaine (EMLA) cream Apply to the Port-A-Cath site 30 to 60 minutes before chemotherapy. 30 g 2  . metFORMIN (GLUCOPHAGE) 500 MG tablet Take 500 mg by mouth 2 (two) times daily with a meal.     . methocarbamol (ROBAXIN) 500 MG tablet Take 1 tablet (500 mg total) by mouth every 6 (six) hours as needed for  muscle spasms. 21 tablet 0  . nitroGLYCERIN (NITROSTAT) 0.4 MG SL tablet Place 1 tablet (0.4 mg total) under the tongue every 5 (five) minutes as needed for chest pain. 25 tablet 2  . ONETOUCH ULTRA test strip USE 1 STRIP TO CHECK GLUCOSE THREE TIMES DAILY    . Pitavastatin Calcium (LIVALO) 4 MG TABS Take 4 mg by mouth at bedtime.     Vladimir Faster Glycol-Propyl Glycol (SYSTANE) 0.4-0.3 % SOLN Place 1 drop into both eyes every 6 (six) hours as needed (dry eyes).     . prochlorperazine (COMPAZINE) 10 MG tablet Take 1 tablet (10 mg total) by mouth every 6 (six) hours as needed for nausea or vomiting. 30 tablet 2  . psyllium (METAMUCIL) 58.6 % packet Take 1 packet by mouth at bedtime as needed (constipation).     Marland Kitchen telmisartan (MICARDIS) 40 MG tablet Take 40 mg by mouth daily.    Marland Kitchen zolpidem (AMBIEN) 10 MG tablet Take 2.5 mg by mouth at bedtime.      No current facility-administered medications for this visit.    SURGICAL HISTORY:  Past Surgical History:  Procedure Laterality Date  . ABDOMINAL HYSTERECTOMY    . bone spur    . CARDIAC CATHETERIZATION  06/13/2012  . carpel tunnel surgery Left   . COLONOSCOPY     9 years ago  . CORONARY ANGIOPLASTY  06/13/2012  . EYE SURGERY     cyst left eye   . IR IMAGING GUIDED PORT INSERTION  10/10/2018  . LEFT HEART CATHETERIZATION WITH CORONARY ANGIOGRAM N/A 06/13/2012   Procedure: LEFT HEART CATHETERIZATION WITH CORONARY ANGIOGRAM;  Surgeon: Laverda Page, MD;  Location: [facility] CATH LAB;  Service: Cardiovascular;  Laterality: N/A;  . NECK SURGERY    . rotator cuff surgery Right 2015  . VIDEO BRONCHOSCOPY WITH ENDOBRONCHIAL NAVIGATION N/A 09/06/2018   Procedure: VIDEO BRONCHOSCOPY WITH ENDOBRONCHIAL NAVIGATION, left upper lung;  Surgeon: Lajuana Matte, MD;  Location: [facility];  Service: Thoracic;  Laterality: N/A;  . VIDEO BRONCHOSCOPY WITH ENDOBRONCHIAL ULTRASOUND Left 09/06/2018   Procedure: VIDEO BRONCHOSCOPY WITH ENDOBRONCHIAL ULTRASOUND, left lung;   Surgeon: Lajuana Matte, MD;  Location: [facility];  Service: Thoracic;  Laterality: Left;  . WISDOM TOOTH EXTRACTION      REVIEW OF SYSTEMS:  A comprehensive review of systems was negative except for: Constitutional: positive for fatigue   PHYSICAL EXAMINATION: General appearance: alert, cooperative and no distress Head: Normocephalic, without obvious abnormality, atraumatic Neck: no adenopathy, no JVD, supple, symmetrical, trachea midline and thyroid not enlarged, symmetric, no tenderness/mass/nodules Lymph nodes: Cervical, supraclavicular, and axillary nodes normal. Resp: clear to auscultation bilaterally Back:  symmetric, no curvature. ROM normal. No CVA tenderness. Cardio: regular rate and rhythm, S1, S2 normal, no murmur, click, rub or gallop GI: soft, non-tender; bowel sounds normal; no masses,  no organomegaly Extremities: extremities normal, atraumatic, no cyanosis or edema  ECOG PERFORMANCE STATUS: 1 - Symptomatic but completely ambulatory  Blood pressure (!) 144/72, pulse 62, temperature 98.5 F (36.9 C), temperature source Oral, resp. rate 17, height 5' (1.524 m), weight 172 lb 8 oz (78.2 kg).  LABORATORY DATA: Lab Results  Component Value Date   WBC 5.9 05/24/2019   HGB 10.0 (L) 05/24/2019   HCT 33.2 (L) 05/24/2019   MCV 85.3 05/24/2019   PLT 254 05/24/2019      Chemistry      Component Value Date/Time   NA 141 05/24/2019 0924   K 3.7 05/24/2019 0924   CL 104 05/24/2019 0924   CO2 27 05/24/2019 0924   BUN 19 05/24/2019 0924   CREATININE 1.88 (H) 05/24/2019 0924      Component Value Date/Time   CALCIUM 9.3 05/24/2019 0924   ALKPHOS 71 05/24/2019 0924   AST 21 05/24/2019 0924   ALT 10 05/24/2019 0924   BILITOT 0.3 05/24/2019 0924       RADIOGRAPHIC STUDIES: No results found.  ASSESSMENT AND PLAN: This is a very pleasant 64 years old African-American female with likely stage IV (T1c, N1, M1C) lung cancer pending tissue diagnosis and presented with left  upper lobe lung nodule in addition to right upper lobe pulmonary nodule as well as left hilar adenopathy and metastatic lesion to the brain. Molecular studies showed no actionable mutation and PDL 1 expression was 1%. The patient underwent SBRT to the metastatic brain lesion under the care of Dr. Isidore Moos. The patient is currently undergoing systemic chemotherapy with carboplatin for AUC of 5, Alimta 500 mg/M2 and Keytruda 200 mg IV every 3 weeks is status post 10 cycles.  Starting from cycle #5 she is on maintenance treatment with Alimta and Keytruda every 3 weeks with only Keytruda on the last few cycles because of the renal insufficiency. I recommended for the patient to proceed with cycle #12 today with single agent Keytruda. She will come back for follow-up visit in 3 weeks for evaluation with repeat CT scan of the chest, abdomen pelvis for restaging of her disease. For the insufficiency, she will discuss with her primary care physician her lab results and recommendation regarding referral to nephrology. The patient was advised to call immediately if she has any concerning symptoms in the interval. The patient voices understanding of current disease status and treatment options and is in agreement with the current care plan. All questions were answered. The patient knows to call the clinic with any problems, questions or concerns. We can certainly see the patient much sooner if necessary.  Disclaimer: This note was dictated with voice recognition software. Similar sounding words can inadvertently be transcribed and may not be corrected upon review.

## 2019-05-24 NOTE — Progress Notes (Signed)
Okay to treat with Keytruda only with Creat of 1.88 per Dr. Julien Nordmann. Continue with B12 injection today per Dr. Julien Nordmann.

## 2019-05-24 NOTE — Patient Instructions (Signed)
[facility] Discharge Instructions for Patients Receiving Chemotherapy  Today you received the following chemotherapy agents: immunotherapy Keytruda  To help prevent nausea and vomiting after your treatment, we encourage you to take your nausea medication as prescribed.    If you develop nausea and vomiting that is not controlled by your nausea medication, call the clinic.   BELOW ARE SYMPTOMS THAT SHOULD BE REPORTED IMMEDIATELY:  *FEVER GREATER THAN 100.5 F  *CHILLS WITH OR WITHOUT FEVER  NAUSEA AND VOMITING THAT IS NOT CONTROLLED WITH YOUR NAUSEA MEDICATION  *UNUSUAL SHORTNESS OF BREATH  *UNUSUAL BRUISING OR BLEEDING  TENDERNESS IN MOUTH AND THROAT WITH OR WITHOUT PRESENCE OF ULCERS  *URINARY PROBLEMS  *BOWEL PROBLEMS  UNUSUAL RASH Items with * indicate a potential emergency and should be followed up as soon as possible.  Feel free to call the clinic should you have any questions or concerns. The clinic phone number is [phone].  Please show the [facility] at check-in to the Emergency Department and triage nurse.

## 2019-05-28 DIAGNOSIS — E1165 Type 2 diabetes mellitus with hyperglycemia: Secondary | ICD-10-CM | POA: Diagnosis not present

## 2019-05-28 DIAGNOSIS — I10 Essential (primary) hypertension: Secondary | ICD-10-CM | POA: Diagnosis not present

## 2019-05-28 DIAGNOSIS — E119 Type 2 diabetes mellitus without complications: Secondary | ICD-10-CM | POA: Diagnosis not present

## 2019-06-01 ENCOUNTER — Other Ambulatory Visit: Payer: Self-pay | Admitting: Cardiology

## 2019-06-04 DIAGNOSIS — E611 Iron deficiency: Secondary | ICD-10-CM | POA: Diagnosis not present

## 2019-06-04 DIAGNOSIS — R918 Other nonspecific abnormal finding of lung field: Secondary | ICD-10-CM | POA: Diagnosis not present

## 2019-06-04 DIAGNOSIS — I2510 Atherosclerotic heart disease of native coronary artery without angina pectoris: Secondary | ICD-10-CM | POA: Diagnosis not present

## 2019-06-04 DIAGNOSIS — E1165 Type 2 diabetes mellitus with hyperglycemia: Secondary | ICD-10-CM | POA: Diagnosis not present

## 2019-06-04 DIAGNOSIS — E559 Vitamin D deficiency, unspecified: Secondary | ICD-10-CM | POA: Diagnosis not present

## 2019-06-04 DIAGNOSIS — K219 Gastro-esophageal reflux disease without esophagitis: Secondary | ICD-10-CM | POA: Diagnosis not present

## 2019-06-04 DIAGNOSIS — N183 Chronic kidney disease, stage 3 unspecified: Secondary | ICD-10-CM | POA: Diagnosis not present

## 2019-06-04 DIAGNOSIS — F5101 Primary insomnia: Secondary | ICD-10-CM | POA: Diagnosis not present

## 2019-06-04 DIAGNOSIS — E785 Hyperlipidemia, unspecified: Secondary | ICD-10-CM | POA: Diagnosis not present

## 2019-06-04 DIAGNOSIS — I10 Essential (primary) hypertension: Secondary | ICD-10-CM | POA: Diagnosis not present

## 2019-06-08 NOTE — Progress Notes (Signed)
Pharmacist Chemotherapy Monitoring - Follow Up Assessment    I verify that I have reviewed each item in the below checklist:  . Regimen for the patient is scheduled for the appropriate day and plan matches scheduled date. Marland Kitchen Appropriate non-routine labs are ordered dependent on drug ordered. . If applicable, additional medications reviewed and ordered per protocol based on lifetime cumulative doses and/or treatment regimen.   Plan for follow-up and/or issues identified: Yes . I-vent associated with next due treatment: Yes . MD and/or nursing notified: No  Romualdo Bolk [facility] 06/08/2019 10:37 AM

## 2019-06-12 ENCOUNTER — Encounter (HOSPITAL_COMMUNITY): Payer: Self-pay

## 2019-06-12 ENCOUNTER — Other Ambulatory Visit: Payer: Self-pay

## 2019-06-12 ENCOUNTER — Ambulatory Visit (HOSPITAL_COMMUNITY)
Admission: RE | Admit: 2019-06-12 | Discharge: 2019-06-12 | Disposition: A | Discharge status: Home / Self Care | Payer: Medicare Other | Source: Ambulatory Visit | Attending: Internal Medicine | Admitting: Internal Medicine

## 2019-06-12 DIAGNOSIS — C3490 Malignant neoplasm of unspecified part of unspecified bronchus or lung: Secondary | ICD-10-CM | POA: Insufficient documentation

## 2019-06-14 ENCOUNTER — Encounter: Payer: Self-pay | Admitting: Internal Medicine

## 2019-06-14 ENCOUNTER — Inpatient Hospital Stay: Payer: Medicare Other

## 2019-06-14 ENCOUNTER — Other Ambulatory Visit: Payer: Self-pay

## 2019-06-14 ENCOUNTER — Inpatient Hospital Stay: Payer: Medicare Other | Attending: Physician Assistant

## 2019-06-14 ENCOUNTER — Inpatient Hospital Stay (HOSPITAL_BASED_OUTPATIENT_CLINIC_OR_DEPARTMENT_OTHER): Payer: Medicare Other | Admitting: Internal Medicine

## 2019-06-14 VITALS — BP 158/72 | HR 55 | Temp 98.7°F | Resp 17 | Ht 60.0 in | Wt 172.9 lb

## 2019-06-14 DIAGNOSIS — N289 Disorder of kidney and ureter, unspecified: Secondary | ICD-10-CM | POA: Diagnosis not present

## 2019-06-14 DIAGNOSIS — Z95828 Presence of other vascular implants and grafts: Secondary | ICD-10-CM

## 2019-06-14 DIAGNOSIS — C3492 Malignant neoplasm of unspecified part of left bronchus or lung: Secondary | ICD-10-CM

## 2019-06-14 DIAGNOSIS — C7931 Secondary malignant neoplasm of brain: Secondary | ICD-10-CM | POA: Diagnosis not present

## 2019-06-14 DIAGNOSIS — C3412 Malignant neoplasm of upper lobe, left bronchus or lung: Secondary | ICD-10-CM | POA: Insufficient documentation

## 2019-06-14 DIAGNOSIS — Z5112 Encounter for antineoplastic immunotherapy: Secondary | ICD-10-CM | POA: Insufficient documentation

## 2019-06-14 DIAGNOSIS — I10 Essential (primary) hypertension: Secondary | ICD-10-CM

## 2019-06-14 DIAGNOSIS — C3491 Malignant neoplasm of unspecified part of right bronchus or lung: Secondary | ICD-10-CM | POA: Diagnosis not present

## 2019-06-14 DIAGNOSIS — I25118 Atherosclerotic heart disease of native coronary artery with other forms of angina pectoris: Secondary | ICD-10-CM

## 2019-06-14 LAB — CMP (CANCER CENTER ONLY)
ALT: 10 U/L (ref 0–44)
AST: 21 U/L (ref 15–41)
Albumin: 3.5 g/dL (ref 3.5–5.0)
Alkaline Phosphatase: 70 U/L (ref 38–126)
Anion gap: 10 (ref 5–15)
BUN: 15 mg/dL (ref 8–23)
CO2: 25 mmol/L (ref 22–32)
Calcium: 9.3 mg/dL (ref 8.9–10.3)
Chloride: 108 mmol/L (ref 98–111)
Creatinine: 1.69 mg/dL — ABNORMAL HIGH (ref 0.44–1.00)
GFR, Est AFR Am: 36 mL/min — ABNORMAL LOW (ref >60)
GFR, Estimated: 31 mL/min — ABNORMAL LOW (ref >60)
Glucose, Bld: 87 mg/dL (ref 70–99)
Potassium: 4.2 mmol/L (ref 3.5–5.1)
Sodium: 143 mmol/L (ref 135–145)
Total Bilirubin: 0.4 mg/dL (ref 0.3–1.2)
Total Protein: 7.5 g/dL (ref 6.5–8.1)

## 2019-06-14 LAB — CBC WITH DIFFERENTIAL (CANCER CENTER ONLY)
Abs Immature Granulocytes: 0.01 10*3/uL (ref 0.00–0.07)
Basophils Absolute: 0 10*3/uL (ref 0.0–0.1)
Basophils Relative: 1 %
Eosinophils Absolute: 0.2 10*3/uL (ref 0.0–0.5)
Eosinophils Relative: 4 %
HCT: 33.9 % — ABNORMAL LOW (ref 36.0–46.0)
Hemoglobin: 10.2 g/dL — ABNORMAL LOW (ref 12.0–15.0)
Immature Granulocytes: 0 %
Lymphocytes Relative: 25 %
Lymphs Abs: 1.3 10*3/uL (ref 0.7–4.0)
MCH: 25.4 pg — ABNORMAL LOW (ref 26.0–34.0)
MCHC: 30.1 g/dL (ref 30.0–36.0)
MCV: 84.5 fL (ref 80.0–100.0)
Monocytes Absolute: 0.5 10*3/uL (ref 0.1–1.0)
Monocytes Relative: 10 %
Neutro Abs: 3.1 10*3/uL (ref 1.7–7.7)
Neutrophils Relative %: 60 %
Platelet Count: 247 10*3/uL (ref 150–400)
RBC: 4.01 MIL/uL (ref 3.87–5.11)
RDW: 14.6 % (ref 11.5–15.5)
WBC Count: 5.2 10*3/uL (ref 4.0–10.5)
nRBC: 0 % (ref 0.0–0.2)

## 2019-06-14 MED ORDER — SODIUM CHLORIDE 0.9 % IV SOLN
200.0000 mg | Freq: Once | INTRAVENOUS | Status: AC
  Administered 2019-06-14: 200 mg via INTRAVENOUS
  Filled 2019-06-14: qty 8

## 2019-06-14 MED ORDER — PROCHLORPERAZINE MALEATE 10 MG PO TABS: 10.0000 mg | ORAL_TABLET | Freq: Once | ORAL | Status: DC

## 2019-06-14 MED ORDER — SODIUM CHLORIDE 0.9% FLUSH
10.0000 mL | INTRAVENOUS | Status: DC | PRN
  Administered 2019-06-14: 10 mL
  Filled 2019-06-14: qty 10

## 2019-06-14 MED ORDER — SODIUM CHLORIDE 0.9 % IV SOLN
Freq: Once | INTRAVENOUS | Status: AC
  Filled 2019-06-14: qty 250

## 2019-06-14 MED ORDER — HEPARIN SOD (PORK) LOCK FLUSH 100 UNIT/ML IV SOLN
500.0000 [IU] | Freq: Once | INTRAVENOUS | Status: AC | PRN
  Administered 2019-06-14: 500 [IU]
  Filled 2019-06-14: qty 5

## 2019-06-14 NOTE — Progress Notes (Signed)
[facility] [phone]   [phone]  OFFICE PROGRESS NOTE  [name], MD [address] [place] [address]  DIAGNOSIS: Stage IV (T1c, N1, M1c ) non-small cell lung cancer, adenocarcinoma diagnosed in July 2020 and presented with left upper lobe lung nodule in addition to right upper lobe lung nodule with left hilar lymphadenopathy as well as multiple brain metastasis.  Biomarker Findings Microsatellite status - MS-Stable Tumor Mutational Burden - 4 Muts/Mb Genomic Findings For a complete list of the genes assayed, please refer to the Appendix. ATM G204* KRAS G12C PDGFRA P122T SMO R199W 7 Disease relevant genes with no reportable alterations: ALK, BRAF, EGFR, ERBB2, MET, RET, ROS1  PDL 1 expression was 1%  PRIOR THERAPY: SBRT to multiple brain metastasis under the care of Dr. Isidore Moos.  CURRENT THERAPY: Systemic chemotherapy with carboplatin for AUC of 5, Alimta 500 mg/M2 and Keytruda 200 mg IV every 3 weeks.  First dose October 05, 2018.  Status post 12 cycles.  Starting from cycle #5 the patient will be on treatment with maintenance Alimta 500 mg/M2 and Keytruda 200 mg IV every 3 weeks.  She has been on treatment with single agent Keytruda recently secondary to renal insufficiency.  INTERVAL HISTORY: Carolyn [name] 64 y.o. female returns to the clinic today for follow-up visit.  The patient is feeling fine today with no concerning complaints.  She was seen recently by her primary care physician for the renal insufficiency and he recommended for the patient to stop her treatment with hydrochlorothiazide.  He also discontinued Nexium and started her on famotidine.  The patient denied having any current chest pain, shortness of breath, cough or hemoptysis.  She denied having any fever or chills.  She has no nausea, vomiting, diarrhea or constipation.  She denied having any headache or visual changes.  The patient has no recent  weight loss or night sweats.  She tolerated the last cycle of her treatment with single agent Keytruda fairly well.  She is here today for evaluation with repeat CT scan of the chest, abdomen pelvis for restaging of her disease.  MEDICAL HISTORY: Past Medical History:  Diagnosis Date  . Anginal pain ([place])    " with exertion "  . Arthritis    " 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 "  . Asthma    h/o  . Coronary artery disease   . Diabetes mellitus without complication ([place])    Type II  . GERD (gastroesophageal reflux disease)   . H/O hiatal hernia   . Heart murmur   . History of radiation therapy 09/22/2018   stereotactic radiosurgery   . 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 Hypertension   . nscl ca dx'd 08/07/18  . Persistent headaches    h/o  . Pneumonia    hx of PNA  . Shortness of breath   . Vaginal dryness    h/o    ALLERGIES:  is allergic to lisinopril.  MEDICATIONS:  Current Outpatient Medications  Medication Sig Dispense Refill  . acetaminophen (TYLENOL) 500 MG tablet Take 1,000 mg by mouth every 8 (eight) hours as needed for mild pain, fever or headache.     Marland Kitchen amLODipine (NORVASC) 5 MG tablet Take 1 tablet by mouth once daily 90 tablet 2  . aspirin EC 81 MG tablet Take 81 mg by mouth daily.    . carvedilol (COREG) 25 MG tablet Take 12.5 mg by mouth 2 (two) times  daily with a meal.     . cetirizine (ZYRTEC) 10 MG tablet Take 10 mg by mouth daily.    . Cholecalciferol (VITAMIN D-3) 125 MCG (5000 UT) TABS Take 5,000 Units by mouth daily.    . Chromium Picolinate 1000 MCG TABS Take 1,000 mg by mouth daily.    Marland Kitchen esomeprazole (NEXIUM) 20 MG capsule Take 20 mg by mouth daily at 12 noon.    . folic acid (FOLVITE) 1 MG tablet Take 1 tablet (1 mg total) by mouth daily. 30 tablet 2  . glimepiride (AMARYL) 1 MG tablet Take 0.5 mg by mouth daily as needed.    . hydrochlorothiazide (MICROZIDE) 12.5 MG capsule Take 12.5 mg by mouth daily.    Marland Kitchen lidocaine-prilocaine (EMLA) cream Apply to the Port-A-Cath site 30 to  60 minutes before chemotherapy. 30 g 2  . metFORMIN (GLUCOPHAGE) 500 MG tablet Take 500 mg by mouth 2 (two) times daily with a meal.     . methocarbamol (ROBAXIN) 500 MG tablet Take 1 tablet (500 mg total) by mouth every 6 (six) hours as needed for muscle spasms. 21 tablet 0  . nitroGLYCERIN (NITROSTAT) 0.4 MG SL tablet Place 1 tablet (0.4 mg total) under the tongue every 5 (five) minutes as needed for chest pain. 25 tablet 2  . ONETOUCH ULTRA test strip USE 1 STRIP TO CHECK GLUCOSE THREE TIMES DAILY    . Pitavastatin Calcium (LIVALO) 4 MG TABS Take 4 mg by mouth at bedtime.     Vladimir Faster Glycol-Propyl Glycol (SYSTANE) 0.4-0.3 % SOLN Place 1 drop into both eyes every 6 (six) hours as needed (dry eyes).     . prochlorperazine (COMPAZINE) 10 MG tablet Take 1 tablet (10 mg total) by mouth every 6 (six) hours as needed for nausea or vomiting. 30 tablet 2  . psyllium (METAMUCIL) 58.6 % packet Take 1 packet by mouth at bedtime as needed (constipation).     Marland Kitchen telmisartan (MICARDIS) 40 MG tablet Take 40 mg by mouth daily.    Marland Kitchen zolpidem (AMBIEN) 10 MG tablet Take 2.5 mg by mouth at bedtime.      No current facility-administered medications for this visit.    SURGICAL HISTORY:  Past Surgical History:  Procedure Laterality Date  . ABDOMINAL HYSTERECTOMY    . bone spur    . CARDIAC CATHETERIZATION  06/13/2012  . carpel tunnel surgery Left   . COLONOSCOPY     9 years ago  . CORONARY ANGIOPLASTY  06/13/2012  . EYE SURGERY     cyst left eye   . IR IMAGING GUIDED PORT INSERTION  10/10/2018  . LEFT HEART CATHETERIZATION WITH CORONARY ANGIOGRAM N/A 06/13/2012   Procedure: LEFT HEART CATHETERIZATION WITH CORONARY ANGIOGRAM;  Surgeon: Laverda Page, MD;  Location: [facility] C[facility] LAB;  Service: Cardiovascular;  Laterality: N/A;  . NECK SURGERY    . rotator cuff surgery Right 2015  . VIDEO BRONCHOSCOPY WITH ENDOBRONCHIAL NAVIGATION N/A 09/06/2018   Procedure: VIDEO BRONCHOSCOPY WITH ENDOBRONCHIAL NAVIGATION, left  upper lung;  Surgeon: Lajuana Matte, MD;  Location: [facility];  Service: Thoracic;  Laterality: N/A;  . VIDEO BRONCHOSCOPY WITH ENDOBRONCHIAL ULTRASOUND Left 09/06/2018   Procedure: VIDEO BRONCHOSCOPY WITH ENDOBRONCHIAL ULTRASOUND, left lung;  Surgeon: Lajuana Matte, MD;  Location: [facility];  Service: Thoracic;  Laterality: Left;  . WISDOM TOOTH EXTRACTION      REVIEW OF SYSTEMS:  Constitutional: negative Eyes: negative Ears, nose, mouth, throat, and face: negative Respiratory: negative Cardiovascular: negative Gastrointestinal: negative  Genitourinary:negative Integument/breast: negative Hematologic/lymphatic: negative Musculoskeletal:positive for arthralgias Neurological: negative Behavioral/Psych: negative Endocrine: negative Allergic/Immunologic: negative   PHYSICAL EXAMINATION: General appearance: alert, cooperative and no distress Head: Normocephalic, without obvious abnormality, atraumatic Neck: no adenopathy, no JVD, supple, symmetrical, trachea midline and thyroid not enlarged, symmetric, no tenderness/mass/nodules Lymph nodes: Cervical, supraclavicular, and axillary nodes normal. Resp: clear to auscultation bilaterally Back: symmetric, no curvature. ROM normal. No CVA tenderness. Cardio: regular rate and rhythm, S1, S2 normal, no murmur, click, rub or gallop GI: soft, non-tender; bowel sounds normal; no masses,  no organomegaly Extremities: extremities normal, atraumatic, no cyanosis or edema Neurologic: Alert and oriented X 3, normal strength and tone. Normal symmetric reflexes. Normal coordination and gait  ECOG PERFORMANCE STATUS: 1 - Symptomatic but completely ambulatory  Blood pressure (!) 158/72, pulse (!) 55, temperature 98.7 F (37.1 C), temperature source Temporal, resp. rate 17, height 5' (1.524 m), weight 172 lb 14.4 oz (78.4 kg), SpO2 100 %.  LABORATORY DATA: Lab Results  Component Value Date   WBC 5.2 06/14/2019   HGB 10.2 (L) 06/14/2019   HCT  33.9 (L) 06/14/2019   MCV 84.5 06/14/2019   PLT 247 06/14/2019      Chemistry      Component Value Date/Time   NA 143 06/14/2019 1130   K 4.2 06/14/2019 1130   CL 108 06/14/2019 1130   CO2 25 06/14/2019 1130   BUN 15 06/14/2019 1130   CREATININE 1.69 (H) 06/14/2019 1130      Component Value Date/Time   CALCIUM 9.3 06/14/2019 1130   ALKPHOS 70 06/14/2019 1130   AST 21 06/14/2019 1130   ALT 10 06/14/2019 1130   BILITOT 0.4 06/14/2019 1130       RADIOGRAPHIC STUDIES: CT Abdomen Pelvis Wo Contrast  Result Date: 06/12/2019 CLINICAL DATA:  Restaging non-small cell lung cancer. Undergoing immunotherapy. EXAM: CT CHEST, ABDOMEN AND PELVIS WITHOUT CONTRAST TECHNIQUE: Multidetector CT imaging of the chest, abdomen and pelvis was performed following the standard protocol without IV contrast. COMPARISON:  CT scan 04/10/2019 FINDINGS: CT CHEST FINDINGS Cardiovascular: The heart is normal in size. Small stable pericardial effusion. Stable age advanced aortic calcifications. No aneurysm. Stable three-vessel coronary artery calcifications. The right-sided Port-A-Cath tip is in good position in the distal SVC. Mediastinum/Nodes: No mediastinal or hilar mass or adenopathy. The esophagus is grossly normal. Lungs/Pleura: Stable 10 mm nodular lesion in the left upper lobe on image 45/6. Surrounding interstitial changes likely radiation related. Stable 7 mm right upper lobe subpleural sub solid nodular lesion on image 38/6. Stable 3 mm left lower lobe nodule on image 60/6 Stable 4 mm nodule along the right major fissure in the right lower lobe on image 61/6 which is likely a perifissural lymph node. No new pulmonary lesions or pulmonary nodules. No acute overlying pulmonary findings. Stable mild emphysematous changes. No pleural effusions or pleural nodules. Musculoskeletal: No breast masses, supraclavicular or axillary adenopathy. The thyroid gland appears normal. The bony thorax is intact. No worrisome bone  lesions. CT ABDOMEN PELVIS FINDINGS Hepatobiliary: No focal hepatic lesions are identified without contrast. The gallbladder appears normal. No intra or extrahepatic biliary dilatation. Pancreas: No mass, inflammation or ductal dilatation. Spleen: Normal size. No focal lesions. Adrenals/Urinary Tract: The adrenal glands are normal. No renal, ureteral or bladder calculi or mass is identified without contrast. Stomach/Bowel: The stomach, duodenum, small bowel and colon are unremarkable. No acute inflammatory changes, mass lesions or obstructive findings. The terminal ileum is normal. The appendix is normal. Vascular/Lymphatic: Age advanced  atherosclerotic calcifications involving the abdominal aorta, branch vessel ostia and iliac arteries. No aneurysm. No mesenteric or retroperitoneal mass or adenopathy Reproductive: The uterus is surgically absent. Both ovaries are still present and appear normal. Other: No pelvic mass or adenopathy. No free pelvic fluid collections. No inguinal mass or adenopathy. No abdominal wall hernia or subcutaneous lesions. Musculoskeletal: No significant bony findings. Stable degenerative changes involving the lumbar spine with degenerative anterolisthesis of L4. IMPRESSION: 1. Stable 10 mm left upper lobe pulmonary nodule with surrounding interstitial changes, likely radiation related. 2. Stable small bilateral pulmonary nodules. 3. No mediastinal or hilar mass or adenopathy. 4. Stable small pericardial effusion. 5. No findings for abdominal/pelvic metastatic disease or osseous metastatic disease. 6. Age advanced atherosclerotic calcifications involving the thoracic and abdominal aorta and branch vessels including the coronary arteries. Aortic Atherosclerosis (ICD10-I70.0). Electronically Signed   By: Marijo Sanes M.D.   On: 06/12/2019 15:12   CT Chest Wo Contrast  Result Date: 06/12/2019 CLINICAL DATA:  Restaging non-small cell lung cancer. Undergoing immunotherapy. EXAM: CT CHEST,  ABDOMEN AND PELVIS WITHOUT CONTRAST TECHNIQUE: Multidetector CT imaging of the chest, abdomen and pelvis was performed following the standard protocol without IV contrast. COMPARISON:  CT scan 04/10/2019 FINDINGS: CT CHEST FINDINGS Cardiovascular: The heart is normal in size. Small stable pericardial effusion. Stable age advanced aortic calcifications. No aneurysm. Stable three-vessel coronary artery calcifications. The right-sided Port-A-Cath tip is in good position in the distal SVC. Mediastinum/Nodes: No mediastinal or hilar mass or adenopathy. The esophagus is grossly normal. Lungs/Pleura: Stable 10 mm nodular lesion in the left upper lobe on image 45/6. Surrounding interstitial changes likely radiation related. Stable 7 mm right upper lobe subpleural sub solid nodular lesion on image 38/6. Stable 3 mm left lower lobe nodule on image 60/6 Stable 4 mm nodule along the right major fissure in the right lower lobe on image 61/6 which is likely a perifissural lymph node. No new pulmonary lesions or pulmonary nodules. No acute overlying pulmonary findings. Stable mild emphysematous changes. No pleural effusions or pleural nodules. Musculoskeletal: No breast masses, supraclavicular or axillary adenopathy. The thyroid gland appears normal. The bony thorax is intact. No worrisome bone lesions. CT ABDOMEN PELVIS FINDINGS Hepatobiliary: No focal hepatic lesions are identified without contrast. The gallbladder appears normal. No intra or extrahepatic biliary dilatation. Pancreas: No mass, inflammation or ductal dilatation. Spleen: Normal size. No focal lesions. Adrenals/Urinary Tract: The adrenal glands are normal. No renal, ureteral or bladder calculi or mass is identified without contrast. Stomach/Bowel: The stomach, duodenum, small bowel and colon are unremarkable. No acute inflammatory changes, mass lesions or obstructive findings. The terminal ileum is normal. The appendix is normal. Vascular/Lymphatic: Age advanced  atherosclerotic calcifications involving the abdominal aorta, branch vessel ostia and iliac arteries. No aneurysm. No mesenteric or retroperitoneal mass or adenopathy Reproductive: The uterus is surgically absent. Both ovaries are still present and appear normal. Other: No pelvic mass or adenopathy. No free pelvic fluid collections. No inguinal mass or adenopathy. No abdominal wall hernia or subcutaneous lesions. Musculoskeletal: No significant bony findings. Stable degenerative changes involving the lumbar spine with degenerative anterolisthesis of L4. IMPRESSION: 1. Stable 10 mm left upper lobe pulmonary nodule with surrounding interstitial changes, likely radiation related. 2. Stable small bilateral pulmonary nodules. 3. No mediastinal or hilar mass or adenopathy. 4. Stable small pericardial effusion. 5. No findings for abdominal/pelvic metastatic disease or osseous metastatic disease. 6. Age advanced atherosclerotic calcifications involving the thoracic and abdominal aorta and branch vessels including the coronary  arteries. Aortic Atherosclerosis (ICD10-I70.0). Electronically Signed   By: Marijo Sanes M.D.   On: 06/12/2019 15:12    ASSESSMENT AND PLAN: This is a very pleasant 64 years old African-American female with likely stage IV (T1c, N1, M1C) lung cancer pending tissue diagnosis and presented with left upper lobe lung nodule in addition to right upper lobe pulmonary nodule as well as left hilar adenopathy and metastatic lesion to the brain. Molecular studies showed no actionable mutation and PDL 1 expression was 1%. The patient underwent SBRT to the metastatic brain lesion under the care of Dr. Isidore Moos. The patient is currently undergoing systemic chemotherapy with carboplatin for AUC of 5, Alimta 500 mg/M2 and Keytruda 200 mg IV every 3 weeks is status post 12 cycles.  Starting from cycle #5 she is on maintenance treatment with Alimta and Keytruda every 3 weeks with only Keytruda on the last few  cycles because of the renal insufficiency. The patient has been tolerating her treatment with Keytruda fairly well with no concerning adverse effects. She had repeat CT scan of the chest, abdomen pelvis performed recently.  I personally and independently reviewed the scans and discussed the results with the patient today. Her scan showed no concerning findings for disease progression. I recommended for her to continue her current treatment with single agent Keytruda and she will proceed with cycle #13 today. For hypertension I strongly recommend for the patient to take her blood pressure medication as prescribed and to monitor it closely at home. She will come back for follow-up visit in 3 weeks for evaluation before starting cycle #14. The patient was advised to call immediately if she has any concerning symptoms in the interval. The patient voices understanding of current disease status and treatment options and is in agreement with the current care plan. All questions were answered. The patient knows to call the clinic with any problems, questions or concerns. We can certainly see the patient much sooner if necessary.  Disclaimer: This note was dictated with voice recognition software. Similar sounding words can inadvertently be transcribed and may not be corrected upon review.

## 2019-06-14 NOTE — Patient Instructions (Signed)
[facility] Discharge Instructions for Patients Receiving Chemotherapy  Today you received the following chemotherapy agents:  Keytruda.  To help prevent nausea and vomiting after your treatment, we encourage you to take your nausea medication as directed.   If you develop nausea and vomiting that is not controlled by your nausea medication, call the clinic.   BELOW ARE SYMPTOMS THAT SHOULD BE REPORTED IMMEDIATELY:  *FEVER GREATER THAN 100.5 F  *CHILLS WITH OR WITHOUT FEVER  NAUSEA AND VOMITING THAT IS NOT CONTROLLED WITH YOUR NAUSEA MEDICATION  *UNUSUAL SHORTNESS OF BREATH  *UNUSUAL BRUISING OR BLEEDING  TENDERNESS IN MOUTH AND THROAT WITH OR WITHOUT PRESENCE OF ULCERS  *URINARY PROBLEMS  *BOWEL PROBLEMS  UNUSUAL RASH Items with * indicate a potential emergency and should be followed up as soon as possible.  Feel free to call the clinic should you have any questions or concerns. The clinic phone number is [phone].  Please show the CHEMO ALERT CARD at check-in to the Emergency Department and triage nurse.    

## 2019-06-15 ENCOUNTER — Other Ambulatory Visit: Payer: Medicare Other

## 2019-06-15 ENCOUNTER — Ambulatory Visit: Payer: Medicare Other | Admitting: Physician Assistant

## 2019-06-15 ENCOUNTER — Ambulatory Visit: Payer: Medicare Other

## 2019-06-19 ENCOUNTER — Telehealth: Payer: Self-pay | Admitting: Internal Medicine

## 2019-06-19 DIAGNOSIS — M1990 Unspecified osteoarthritis, unspecified site: Secondary | ICD-10-CM | POA: Diagnosis not present

## 2019-06-19 DIAGNOSIS — M549 Dorsalgia, unspecified: Secondary | ICD-10-CM | POA: Diagnosis not present

## 2019-06-19 DIAGNOSIS — M2550 Pain in unspecified joint: Secondary | ICD-10-CM | POA: Diagnosis not present

## 2019-06-19 DIAGNOSIS — C3490 Malignant neoplasm of unspecified part of unspecified bronchus or lung: Secondary | ICD-10-CM | POA: Diagnosis not present

## 2019-06-19 DIAGNOSIS — R768 Other specified abnormal immunological findings in serum: Secondary | ICD-10-CM | POA: Diagnosis not present

## 2019-06-19 NOTE — Telephone Encounter (Signed)
Scheduled per los. Called and left msg. Mailed printout  °

## 2019-06-28 DIAGNOSIS — E118 Type 2 diabetes mellitus with unspecified complications: Secondary | ICD-10-CM | POA: Diagnosis not present

## 2019-06-28 DIAGNOSIS — N189 Chronic kidney disease, unspecified: Secondary | ICD-10-CM | POA: Diagnosis not present

## 2019-06-28 NOTE — Progress Notes (Signed)
Pharmacist Chemotherapy Monitoring - Follow Up Assessment    I verify that I have reviewed each item in the below checklist:  . Regimen for the patient is scheduled for the appropriate day and plan matches scheduled date. Marland Kitchen Appropriate non-routine labs are ordered dependent on drug ordered. . If applicable, additional medications reviewed and ordered per protocol based on lifetime cumulative doses and/or treatment regimen.   Plan for follow-up and/or issues identified: No . I-vent associated with next due treatment: No . MD and/or nursing notified: No  Carolyn [name] 06/28/2019 12:26 PM

## 2019-07-02 ENCOUNTER — Other Ambulatory Visit: Payer: Self-pay

## 2019-07-02 ENCOUNTER — Ambulatory Visit
Admission: RE | Admit: 2019-07-02 | Discharge: 2019-07-02 | Disposition: A | Discharge status: Home / Self Care | Payer: Medicare Other | Source: Ambulatory Visit | Attending: Radiation Oncology | Admitting: Radiation Oncology

## 2019-07-02 DIAGNOSIS — C7931 Secondary malignant neoplasm of brain: Secondary | ICD-10-CM

## 2019-07-02 DIAGNOSIS — C719 Malignant neoplasm of brain, unspecified: Secondary | ICD-10-CM | POA: Diagnosis not present

## 2019-07-02 MED ORDER — HEPARIN SOD (PORK) LOCK FLUSH 100 UNIT/ML IV SOLN
500.0000 [IU] | Freq: Once | INTRAVENOUS | Status: AC
  Administered 2019-07-02: 500 [IU] via INTRAVENOUS

## 2019-07-02 MED ORDER — GADOBENATE DIMEGLUMINE 529 MG/ML IV SOLN
15.0000 mL | Freq: Once | INTRAVENOUS | Status: AC | PRN
  Administered 2019-07-02: 15 mL via INTRAVENOUS

## 2019-07-02 MED ORDER — SODIUM CHLORIDE 0.9% FLUSH
10.0000 mL | INTRAVENOUS | Status: DC | PRN
  Administered 2019-07-02: 10 mL via INTRAVENOUS

## 2019-07-03 ENCOUNTER — Other Ambulatory Visit: Payer: Self-pay | Admitting: Cardiology

## 2019-07-04 ENCOUNTER — Other Ambulatory Visit: Payer: Self-pay

## 2019-07-04 ENCOUNTER — Encounter: Payer: Self-pay | Admitting: Internal Medicine

## 2019-07-04 ENCOUNTER — Inpatient Hospital Stay: Payer: Medicare Other

## 2019-07-04 ENCOUNTER — Inpatient Hospital Stay (HOSPITAL_BASED_OUTPATIENT_CLINIC_OR_DEPARTMENT_OTHER): Payer: Medicare Other | Admitting: Internal Medicine

## 2019-07-04 VITALS — BP 144/66 | HR 63 | Temp 97.5°F | Resp 20 | Ht 60.0 in | Wt 175.0 lb

## 2019-07-04 DIAGNOSIS — I10 Essential (primary) hypertension: Secondary | ICD-10-CM

## 2019-07-04 DIAGNOSIS — C7931 Secondary malignant neoplasm of brain: Secondary | ICD-10-CM

## 2019-07-04 DIAGNOSIS — C3412 Malignant neoplasm of upper lobe, left bronchus or lung: Secondary | ICD-10-CM | POA: Diagnosis not present

## 2019-07-04 DIAGNOSIS — Z5112 Encounter for antineoplastic immunotherapy: Secondary | ICD-10-CM

## 2019-07-04 DIAGNOSIS — N289 Disorder of kidney and ureter, unspecified: Secondary | ICD-10-CM | POA: Diagnosis not present

## 2019-07-04 DIAGNOSIS — C3492 Malignant neoplasm of unspecified part of left bronchus or lung: Secondary | ICD-10-CM

## 2019-07-04 DIAGNOSIS — I25118 Atherosclerotic heart disease of native coronary artery with other forms of angina pectoris: Secondary | ICD-10-CM

## 2019-07-04 DIAGNOSIS — Z95828 Presence of other vascular implants and grafts: Secondary | ICD-10-CM

## 2019-07-04 LAB — CBC WITH DIFFERENTIAL (CANCER CENTER ONLY)
Abs Immature Granulocytes: 0.01 10*3/uL (ref 0.00–0.07)
Basophils Absolute: 0 10*3/uL (ref 0.0–0.1)
Basophils Relative: 1 %
Eosinophils Absolute: 0.3 10*3/uL (ref 0.0–0.5)
Eosinophils Relative: 5 %
HCT: 32.3 % — ABNORMAL LOW (ref 36.0–46.0)
Hemoglobin: 9.8 g/dL — ABNORMAL LOW (ref 12.0–15.0)
Immature Granulocytes: 0 %
Lymphocytes Relative: 25 %
Lymphs Abs: 1.4 10*3/uL (ref 0.7–4.0)
MCH: 25.1 pg — ABNORMAL LOW (ref 26.0–34.0)
MCHC: 30.3 g/dL (ref 30.0–36.0)
MCV: 82.6 fL (ref 80.0–100.0)
Monocytes Absolute: 0.5 10*3/uL (ref 0.1–1.0)
Monocytes Relative: 10 %
Neutro Abs: 3.5 10*3/uL (ref 1.7–7.7)
Neutrophils Relative %: 59 %
Platelet Count: 237 10*3/uL (ref 150–400)
RBC: 3.91 MIL/uL (ref 3.87–5.11)
RDW: 15 % (ref 11.5–15.5)
WBC Count: 5.7 10*3/uL (ref 4.0–10.5)
nRBC: 0 % (ref 0.0–0.2)

## 2019-07-04 LAB — CMP (CANCER CENTER ONLY)
ALT: 10 U/L (ref 0–44)
AST: 18 U/L (ref 15–41)
Albumin: 3.4 g/dL — ABNORMAL LOW (ref 3.5–5.0)
Alkaline Phosphatase: 70 U/L (ref 38–126)
Anion gap: 10 (ref 5–15)
BUN: 16 mg/dL (ref 8–23)
CO2: 26 mmol/L (ref 22–32)
Calcium: 9.1 mg/dL (ref 8.9–10.3)
Chloride: 106 mmol/L (ref 98–111)
Creatinine: 1.81 mg/dL — ABNORMAL HIGH (ref 0.44–1.00)
GFR, Est AFR Am: 33 mL/min — ABNORMAL LOW (ref >60)
GFR, Estimated: 29 mL/min — ABNORMAL LOW (ref >60)
Glucose, Bld: 109 mg/dL — ABNORMAL HIGH (ref 70–99)
Potassium: 3.7 mmol/L (ref 3.5–5.1)
Sodium: 142 mmol/L (ref 135–145)
Total Bilirubin: 0.3 mg/dL (ref 0.3–1.2)
Total Protein: 7.3 g/dL (ref 6.5–8.1)

## 2019-07-04 MED ORDER — SODIUM CHLORIDE 0.9% FLUSH
10.0000 mL | INTRAVENOUS | Status: DC | PRN
  Administered 2019-07-04: 10 mL
  Filled 2019-07-04: qty 10

## 2019-07-04 MED ORDER — SODIUM CHLORIDE 0.9 % IV SOLN
Freq: Once | INTRAVENOUS | Status: AC
  Filled 2019-07-04: qty 250

## 2019-07-04 MED ORDER — HEPARIN SOD (PORK) LOCK FLUSH 100 UNIT/ML IV SOLN
500.0000 [IU] | Freq: Once | INTRAVENOUS | Status: AC | PRN
  Administered 2019-07-04: 500 [IU]
  Filled 2019-07-04: qty 5

## 2019-07-04 MED ORDER — SODIUM CHLORIDE 0.9 % IV SOLN
200.0000 mg | Freq: Once | INTRAVENOUS | Status: AC
  Administered 2019-07-04: 200 mg via INTRAVENOUS
  Filled 2019-07-04: qty 8

## 2019-07-04 NOTE — Progress Notes (Signed)
Per Dr. Julien Nordmann, ok to remove Compazine as a premed from patient's treatment plan. Called and spoke to pharmacy, who will remove it from patient's treatment plan.

## 2019-07-04 NOTE — Progress Notes (Signed)
[place] [phone]   [phone]  OFFICE PROGRESS NOTE  [name], MD [address] [place] [address]  DIAGNOSIS: Stage IV (T1c, N1, M1c ) non-small cell lung cancer, adenocarcinoma diagnosed in July 2020 and presented with left upper lobe lung nodule in addition to right upper lobe lung nodule with left hilar lymphadenopathy as well as multiple brain metastasis.  Biomarker Findings Microsatellite status - MS-Stable Tumor Mutational Burden - 4 Muts/Mb Genomic Findings For a complete list of the genes assayed, please refer to the Appendix. ATM G204* KRAS G12C PDGFRA P122T SMO R199W 7 Disease relevant genes with no reportable alterations: ALK, BRAF, EGFR, ERBB2, MET, RET, ROS1  PDL 1 expression was 1%  PRIOR THERAPY: SBRT to multiple brain metastasis under the care of Dr. Isidore Moos.  CURRENT THERAPY: Systemic chemotherapy with carboplatin for AUC of 5, Alimta 500 mg/M2 and Keytruda 200 mg IV every 3 weeks.  First dose October 05, 2018.  Status post 12 cycles.  Starting from cycle #5 the patient will be on treatment with maintenance Alimta 500 mg/M2 and Keytruda 200 mg IV every 3 weeks.  She has been on treatment with single agent Keytruda recently secondary to renal insufficiency.  INTERVAL HISTORY: Carolyn [name] 64 y.o. female returns to the clinic today for follow-up visit.  The patient is feeling fine today with no concerning complaints except for fatigue.  She tolerated the last cycle of her treatment with single agent Keytruda fairly well.  She was seen recently by her primary care physician and he discontinued HCTZ.  She has no current chest pain, shortness of breath, cough or hemoptysis.  She denied having any fever or chills.  She has no nausea, vomiting, diarrhea or constipation.  She has no headache or visual changes.  She is here today for evaluation before starting cycle #14 of her treatment.  She had MRI of the  brain performed 2 days ago that was unremarkable for any disease progression in the brain.  MEDICAL HISTORY: Past Medical History:  Diagnosis Date  . Anginal pain ([place])    " with exertion "  . Arthritis    " 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 "  . Asthma    h/o  . Coronary artery disease   . Diabetes mellitus without complication ([place])    Type II  . GERD (gastroesophageal reflux disease)   . H/O hiatal hernia   . Heart murmur   . History of radiation therapy 09/22/2018   stereotactic radiosurgery   . 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 Hypertension   . nscl ca dx'd 08/07/18  . Persistent headaches    h/o  . Pneumonia    hx of PNA  . Shortness of breath   . Vaginal dryness    h/o    ALLERGIES:  is allergic to lisinopril.  MEDICATIONS:  Current Outpatient Medications  Medication Sig Dispense Refill  . acetaminophen (TYLENOL) 500 MG tablet Take 1,000 mg by mouth every 8 (eight) hours as needed for mild pain, fever or headache.     Marland Kitchen amLODipine (NORVASC) 5 MG tablet Take 1 tablet by mouth once daily 90 tablet 2  . aspirin EC 81 MG tablet Take 81 mg by mouth daily.    . carvedilol (COREG) 25 MG tablet Take 1/2 (one-half) tablet by mouth twice daily 180 tablet 0  . cetirizine (ZYRTEC) 10 MG tablet Take 10 mg by mouth daily.    Marland Kitchen  Cholecalciferol (VITAMIN D-3) 125 MCG (5000 UT) TABS Take 5,000 Units by mouth daily.    . Chromium Picolinate 1000 MCG TABS Take 1,000 mg by mouth daily.    . folic acid (FOLVITE) 1 MG tablet Take 1 tablet (1 mg total) by mouth daily. 30 tablet 2  . glimepiride (AMARYL) 1 MG tablet Take 0.5 mg by mouth daily as needed.    . lidocaine-prilocaine (EMLA) cream Apply to the Port-A-Cath site 30 to 60 minutes before chemotherapy. 30 g 2  . metFORMIN (GLUCOPHAGE) 500 MG tablet Take 500 mg by mouth 2 (two) times daily with a meal.     . methocarbamol (ROBAXIN) 500 MG tablet Take 1 tablet (500 mg total) by mouth every 6 (six) hours as needed for muscle spasms. 21 tablet 0  . nitroGLYCERIN  (NITROSTAT) 0.4 MG SL tablet Place 1 tablet (0.4 mg total) under the tongue every 5 (five) minutes as needed for chest pain. 25 tablet 2  . ONETOUCH ULTRA test strip USE 1 STRIP TO CHECK GLUCOSE THREE TIMES DAILY    . Pitavastatin Calcium (LIVALO) 4 MG TABS Take 4 mg by mouth at bedtime.     Vladimir Faster Glycol-Propyl Glycol (SYSTANE) 0.4-0.3 % SOLN Place 1 drop into both eyes every 6 (six) hours as needed (dry eyes).     . prochlorperazine (COMPAZINE) 10 MG tablet Take 1 tablet (10 mg total) by mouth every 6 (six) hours as needed for nausea or vomiting. 30 tablet 2  . psyllium (METAMUCIL) 58.6 % packet Take 1 packet by mouth at bedtime as needed (constipation).     Marland Kitchen telmisartan (MICARDIS) 40 MG tablet Take 40 mg by mouth daily.    Marland Kitchen zolpidem (AMBIEN) 10 MG tablet Take 2.5 mg by mouth at bedtime.      No current facility-administered medications for this visit.    SURGICAL HISTORY:  Past Surgical History:  Procedure Laterality Date  . ABDOMINAL HYSTERECTOMY    . bone spur    . CARDIAC CATHETERIZATION  06/13/2012  . carpel tunnel surgery Left   . COLONOSCOPY     9 years ago  . CORONARY ANGIOPLASTY  06/13/2012  . EYE SURGERY     cyst left eye   . IR IMAGING GUIDED PORT INSERTION  10/10/2018  . LEFT HEART CATHETERIZATION WITH CORONARY ANGIOGRAM N/A 06/13/2012   Procedure: LEFT HEART CATHETERIZATION WITH CORONARY ANGIOGRAM;  Surgeon: Laverda Page, MD;  Location[facility] CATH LAB;  Service: Cardiovascular;  Laterality: N/A;  . NECK SURGERY    . rotator cuff surgery Right 2015  . VIDEO BRONCHOSCOPY WITH ENDOBRONCHIAL NAVIGATION N/A 09/06/2018   Procedure: VIDEO BRONCHOSCOPY WITH ENDOBRONCHIAL NAVIGATION, left upper lung;  Surgeon: Lajuana Matte, MD;  Location: [place];  Service: Thoracic;  Laterality: N/A;  . VIDEO BRONCHOSCOPY WITH ENDOBRONCHIAL ULTRASOUND Left 09/06/2018   Procedure: VIDEO BRONCHOSCOPY WITH ENDOBRONCHIAL ULTRASOUND, left lung;  Surgeon: Lajuana Matte, MD;  Location:  [place];  Service: Thoracic;  Laterality: Left;  . WISDOM TOOTH EXTRACTION      REVIEW OF SYSTEMS:  A comprehensive review of systems was negative except for: Constitutional: positive for fatigue   PHYSICAL EXAMINATION: General appearance: alert, cooperative and no distress Head: Normocephalic, without obvious abnormality, atraumatic Neck: no adenopathy, no JVD, supple, symmetrical, trachea midline and thyroid not enlarged, symmetric, no tenderness/mass/nodules Lymph nodes: Cervical, supraclavicular, and axillary nodes normal. Resp: clear to auscultation bilaterally Back: symmetric, no curvature. ROM normal. No CVA tenderness. Cardio: regular rate and rhythm, S1,  S2 normal, no murmur, click, rub or gallop GI: soft, non-tender; bowel sounds normal; no masses,  no organomegaly Extremities: extremities normal, atraumatic, no cyanosis or edema  ECOG PERFORMANCE STATUS: 1 - Symptomatic but completely ambulatory  Blood pressure (!) 144/66, pulse 63, temperature (!) 97.5 F (36.4 C), temperature source Temporal, resp. rate 20, height 5' (1.524 m), weight 175 lb (79.4 kg), SpO2 100 %.  LABORATORY DATA: Lab Results  Component Value Date   WBC 5.7 07/04/2019   HGB 9.8 (L) 07/04/2019   HCT 32.3 (L) 07/04/2019   MCV 82.6 07/04/2019   PLT 237 07/04/2019      Chemistry      Component Value Date/Time   NA 142 07/04/2019 0850   K 3.7 07/04/2019 0850   CL 106 07/04/2019 0850   CO2 26 07/04/2019 0850   BUN 16 07/04/2019 0850   CREATININE 1.81 (H) 07/04/2019 0850      Component Value Date/Time   CALCIUM 9.1 07/04/2019 0850   ALKPHOS 70 07/04/2019 0850   AST 18 07/04/2019 0850   ALT 10 07/04/2019 0850   BILITOT 0.3 07/04/2019 0850       RADIOGRAPHIC STUDIES: CT Abdomen Pelvis Wo Contrast  Result Date: 06/12/2019 CLINICAL DATA:  Restaging non-small cell lung cancer. Undergoing immunotherapy. EXAM: CT CHEST, ABDOMEN AND PELVIS WITHOUT CONTRAST TECHNIQUE: Multidetector CT imaging of the  chest, abdomen and pelvis was performed following the standard protocol without IV contrast. COMPARISON:  CT scan 04/10/2019 FINDINGS: CT CHEST FINDINGS Cardiovascular: The heart is normal in size. Small stable pericardial effusion. Stable age advanced aortic calcifications. No aneurysm. Stable three-vessel coronary artery calcifications. The right-sided Port-A-Cath tip is in good position in the distal SVC. Mediastinum/Nodes: No mediastinal or hilar mass or adenopathy. The esophagus is grossly normal. Lungs/Pleura: Stable 10 mm nodular lesion in the left upper lobe on image 45/6. Surrounding interstitial changes likely radiation related. Stable 7 mm right upper lobe subpleural sub solid nodular lesion on image 38/6. Stable 3 mm left lower lobe nodule on image 60/6 Stable 4 mm nodule along the right major fissure in the right lower lobe on image 61/6 which is likely a perifissural lymph node. No new pulmonary lesions or pulmonary nodules. No acute overlying pulmonary findings. Stable mild emphysematous changes. No pleural effusions or pleural nodules. Musculoskeletal: No breast masses, supraclavicular or axillary adenopathy. The thyroid gland appears normal. The bony thorax is intact. No worrisome bone lesions. CT ABDOMEN PELVIS FINDINGS Hepatobiliary: No focal hepatic lesions are identified without contrast. The gallbladder appears normal. No intra or extrahepatic biliary dilatation. Pancreas: No mass, inflammation or ductal dilatation. Spleen: Normal size. No focal lesions. Adrenals/Urinary Tract: The adrenal glands are normal. No renal, ureteral or bladder calculi or mass is identified without contrast. Stomach/Bowel: The stomach, duodenum, small bowel and colon are unremarkable. No acute inflammatory changes, mass lesions or obstructive findings. The terminal ileum is normal. The appendix is normal. Vascular/Lymphatic: Age advanced atherosclerotic calcifications involving the abdominal aorta, branch vessel ostia  and iliac arteries. No aneurysm. No mesenteric or retroperitoneal mass or adenopathy Reproductive: The uterus is surgically absent. Both ovaries are still present and appear normal. Other: No pelvic mass or adenopathy. No free pelvic fluid collections. No inguinal mass or adenopathy. No abdominal wall hernia or subcutaneous lesions. Musculoskeletal: No significant bony findings. Stable degenerative changes involving the lumbar spine with degenerative anterolisthesis of L4. IMPRESSION: 1. Stable 10 mm left upper lobe pulmonary nodule with surrounding interstitial changes, likely radiation related. 2. Stable small bilateral  pulmonary nodules. 3. No mediastinal or hilar mass or adenopathy. 4. Stable small pericardial effusion. 5. No findings for abdominal/pelvic metastatic disease or osseous metastatic disease. 6. Age advanced atherosclerotic calcifications involving the thoracic and abdominal aorta and branch vessels including the coronary arteries. Aortic Atherosclerosis (ICD10-I70.0). Electronically Signed   By: Marijo Sanes M.D.   On: 06/12/2019 15:12   CT Chest Wo Contrast  Result Date: 06/12/2019 CLINICAL DATA:  Restaging non-small cell lung cancer. Undergoing immunotherapy. EXAM: CT CHEST, ABDOMEN AND PELVIS WITHOUT CONTRAST TECHNIQUE: Multidetector CT imaging of the chest, abdomen and pelvis was performed following the standard protocol without IV contrast. COMPARISON:  CT scan 04/10/2019 FINDINGS: CT CHEST FINDINGS Cardiovascular: The heart is normal in size. Small stable pericardial effusion. Stable age advanced aortic calcifications. No aneurysm. Stable three-vessel coronary artery calcifications. The right-sided Port-A-Cath tip is in good position in the distal SVC. Mediastinum/Nodes: No mediastinal or hilar mass or adenopathy. The esophagus is grossly normal. Lungs/Pleura: Stable 10 mm nodular lesion in the left upper lobe on image 45/6. Surrounding interstitial changes likely radiation related. Stable  7 mm right upper lobe subpleural sub solid nodular lesion on image 38/6. Stable 3 mm left lower lobe nodule on image 60/6 Stable 4 mm nodule along the right major fissure in the right lower lobe on image 61/6 which is likely a perifissural lymph node. No new pulmonary lesions or pulmonary nodules. No acute overlying pulmonary findings. Stable mild emphysematous changes. No pleural effusions or pleural nodules. Musculoskeletal: No breast masses, supraclavicular or axillary adenopathy. The thyroid gland appears normal. The bony thorax is intact. No worrisome bone lesions. CT ABDOMEN PELVIS FINDINGS Hepatobiliary: No focal hepatic lesions are identified without contrast. The gallbladder appears normal. No intra or extrahepatic biliary dilatation. Pancreas: No mass, inflammation or ductal dilatation. Spleen: Normal size. No focal lesions. Adrenals/Urinary Tract: The adrenal glands are normal. No renal, ureteral or bladder calculi or mass is identified without contrast. Stomach/Bowel: The stomach, duodenum, small bowel and colon are unremarkable. No acute inflammatory changes, mass lesions or obstructive findings. The terminal ileum is normal. The appendix is normal. Vascular/Lymphatic: Age advanced atherosclerotic calcifications involving the abdominal aorta, branch vessel ostia and iliac arteries. No aneurysm. No mesenteric or retroperitoneal mass or adenopathy Reproductive: The uterus is surgically absent. Both ovaries are still present and appear normal. Other: No pelvic mass or adenopathy. No free pelvic fluid collections. No inguinal mass or adenopathy. No abdominal wall hernia or subcutaneous lesions. Musculoskeletal: No significant bony findings. Stable degenerative changes involving the lumbar spine with degenerative anterolisthesis of L4. IMPRESSION: 1. Stable 10 mm left upper lobe pulmonary nodule with surrounding interstitial changes, likely radiation related. 2. Stable small bilateral pulmonary nodules. 3.  No mediastinal or hilar mass or adenopathy. 4. Stable small pericardial effusion. 5. No findings for abdominal/pelvic metastatic disease or osseous metastatic disease. 6. Age advanced atherosclerotic calcifications involving the thoracic and abdominal aorta and branch vessels including the coronary arteries. Aortic Atherosclerosis (ICD10-I70.0). Electronically Signed   By: Marijo Sanes M.D.   On: 06/12/2019 15:12   MR Brain W Wo Contrast  Result Date: 07/02/2019 CLINICAL DATA:  Brain/CNS neoplasm, surveillance 3T SRS Protocol - follow-up of treated metastatic disease. EXAM: MRI HEAD WITHOUT AND WITH CONTRAST TECHNIQUE: Multiplanar, multiecho pulse sequences of the brain and surrounding structures were obtained without and with intravenous contrast. CONTRAST:  61m MULTIHANCE GADOBENATE DIMEGLUMINE 529 MG/ML IV SOLN COMPARISON:  MRI of the brain April 05, 2019 FINDINGS: Brain: No acute infarction, hemorrhage, hydrocephalus, extra-axial  collection or mass lesion. Unchanged encephalomalacia within the bilateral inferior cerebellar hemispheres, left greater than right. No focus of abnormal contrast enhancement seen. Vascular: Normal flow voids. Skull and upper cervical spine: Postsurgical changes from ACDF noted in the visualized upper cervical spine. Sinuses/Orbits: Mild mucosal thickening of the bilateral ethmoid cells. The orbits are maintained. Other: Bilateral mastoid effusions. IMPRESSION: 1. No evidence of intracranial metastatic disease. 2. Unchanged encephalomalacia within the bilateral inferior cerebellar hemispheres, left greater than right. 3. Bilateral mastoid effusions. Electronically Signed   By: Pedro Earls M.D.   On: 07/02/2019 13:07    ASSESSMENT AND PLAN: This is a very pleasant 64 years old African-American female with likely stage IV (T1c, N1, M1C) lung cancer pending tissue diagnosis and presented with left upper lobe lung nodule in addition to right upper lobe  pulmonary nodule as well as left hilar adenopathy and metastatic lesion to the brain. Molecular studies showed no actionable mutation and PDL 1 expression was 1%. The patient underwent SBRT to the metastatic brain lesion under the care of Dr. Isidore Moos. The patient is currently undergoing systemic chemotherapy with carboplatin for AUC of 5, Alimta 500 mg/M2 and Keytruda 200 mg IV every 3 weeks is status post 13 cycles.  Starting from cycle #5 she is on maintenance treatment with Alimta and Keytruda every 3 weeks with only Keytruda on the last few cycles because of the renal insufficiency. The patient continues to tolerate her treatment fairly well with no concerning adverse effects. I recommended for her to proceed with cycle #14 today as planned. She will come back for follow-up visit in 3 weeks for evaluation before the next cycle of her treatment. She was advised to call immediately if she has any concerning symptoms in the interval. The patient voices understanding of current disease status and treatment options and is in agreement with the current care plan. All questions were answered. The patient knows to call the clinic with any problems, questions or concerns. We can certainly see the patient much sooner if necessary.  Disclaimer: This note was dictated with voice recognition software. Similar sounding words can inadvertently be transcribed and may not be corrected upon review.

## 2019-07-04 NOTE — Patient Instructions (Signed)
[facility] Discharge Instructions for Patients Receiving Chemotherapy  Today you received the following chemotherapy agents:  Keytruda.  To help prevent nausea and vomiting after your treatment, we encourage you to take your nausea medication as directed.   If you develop nausea and vomiting that is not controlled by your nausea medication, call the clinic.   BELOW ARE SYMPTOMS THAT SHOULD BE REPORTED IMMEDIATELY:  *FEVER GREATER THAN 100.5 F  *CHILLS WITH OR WITHOUT FEVER  NAUSEA AND VOMITING THAT IS NOT CONTROLLED WITH YOUR NAUSEA MEDICATION  *UNUSUAL SHORTNESS OF BREATH  *UNUSUAL BRUISING OR BLEEDING  TENDERNESS IN MOUTH AND THROAT WITH OR WITHOUT PRESENCE OF ULCERS  *URINARY PROBLEMS  *BOWEL PROBLEMS  UNUSUAL RASH Items with * indicate a potential emergency and should be followed up as soon as possible.  Feel free to call the clinic should you have any questions or concerns. The clinic phone number is [phone].  Please show the CHEMO ALERT CARD at check-in to the Emergency Department and triage nurse.    

## 2019-07-05 ENCOUNTER — Inpatient Hospital Stay (HOSPITAL_BASED_OUTPATIENT_CLINIC_OR_DEPARTMENT_OTHER): Payer: Medicare Other | Admitting: Internal Medicine

## 2019-07-05 ENCOUNTER — Other Ambulatory Visit: Payer: Self-pay

## 2019-07-05 VITALS — BP 138/70 | HR 60 | Temp 98.5°F | Resp 18 | Wt 173.3 lb

## 2019-07-05 DIAGNOSIS — K219 Gastro-esophageal reflux disease without esophagitis: Secondary | ICD-10-CM | POA: Diagnosis not present

## 2019-07-05 DIAGNOSIS — F5101 Primary insomnia: Secondary | ICD-10-CM | POA: Diagnosis not present

## 2019-07-05 DIAGNOSIS — E785 Hyperlipidemia, unspecified: Secondary | ICD-10-CM | POA: Diagnosis not present

## 2019-07-05 DIAGNOSIS — C3412 Malignant neoplasm of upper lobe, left bronchus or lung: Secondary | ICD-10-CM | POA: Diagnosis not present

## 2019-07-05 DIAGNOSIS — C7931 Secondary malignant neoplasm of brain: Secondary | ICD-10-CM

## 2019-07-05 DIAGNOSIS — M1990 Unspecified osteoarthritis, unspecified site: Secondary | ICD-10-CM | POA: Diagnosis not present

## 2019-07-05 DIAGNOSIS — N289 Disorder of kidney and ureter, unspecified: Secondary | ICD-10-CM | POA: Diagnosis not present

## 2019-07-05 DIAGNOSIS — E119 Type 2 diabetes mellitus without complications: Secondary | ICD-10-CM | POA: Diagnosis not present

## 2019-07-05 DIAGNOSIS — Z5112 Encounter for antineoplastic immunotherapy: Secondary | ICD-10-CM | POA: Diagnosis not present

## 2019-07-05 DIAGNOSIS — N189 Chronic kidney disease, unspecified: Secondary | ICD-10-CM | POA: Diagnosis not present

## 2019-07-05 DIAGNOSIS — I10 Essential (primary) hypertension: Secondary | ICD-10-CM | POA: Diagnosis not present

## 2019-07-05 NOTE — Progress Notes (Signed)
[facility] at [place], [place] [address] [phone]   Interval Evaluation  Date of Service: 07/05/19 Patient Name: Carolyn [name] Patient MRN: [id number] Patient DOB: 06/26/1955 Provider: Ventura Sellers, MD  Identifying Statement:  Carolyn [name] is a 64 y.o. female with Brain metastases ([place]) [C79.31]   Primary Cancer:  Oncologic History: Oncology History  Adenocarcinoma, lung ([place])  09/12/2018 Initial Diagnosis   Adenocarcinoma, lung ([place])   10/05/2018 -  Chemotherapy   The patient had palonosetron (ALOXI) injection 0.25 mg, 0.25 mg, Intravenous,  Once, 4 of 4 cycles Administration: 0.25 mg (10/05/2018), 0.25 mg (11/16/2018), 0.25 mg (12/07/2018), 0.25 mg (10/26/2018) PEMEtrexed (ALIMTA) 900 mg in sodium chloride 0.9 % 100 mL chemo infusion, 485 mg/m2 = 925 mg, Intravenous,  Once, 8 of 8 cycles Administration: 900 mg (10/05/2018), 900 mg (11/16/2018), 900 mg (12/07/2018), 900 mg (12/28/2018), 900 mg (01/18/2019), 900 mg (10/26/2018), 900 mg (02/08/2019), 900 mg (03/01/2019) CARBOplatin (PARAPLATIN) 440 mg in sodium chloride 0.9 % 250 mL chemo infusion, 440 mg (100 % of original dose 442.5 mg), Intravenous,  Once, 4 of 4 cycles Dose modification: 442.5 mg (original dose 442.5 mg, Cycle 1), 480.5 mg (original dose 480.5 mg, Cycle 3), 467 mg (original dose 467 mg, Cycle 4), 484 mg (original dose 484 mg, Cycle 2) Administration: 440 mg (10/05/2018), 480 mg (11/16/2018), 470 mg (12/07/2018), 480 mg (10/26/2018) pembrolizumab (KEYTRUDA) 200 mg in sodium chloride 0.9 % 50 mL chemo infusion, 200 mg, Intravenous, Once, 14 of 21 cycles Administration: 200 mg (10/05/2018), 200 mg (11/16/2018), 200 mg (12/07/2018), 200 mg (12/28/2018), 200 mg (01/18/2019), 200 mg (10/26/2018), 200 mg (02/08/2019), 200 mg (03/01/2019), 200 mg (03/22/2019), 200 mg (04/12/2019), 200 mg (05/03/2019), 200 mg (05/24/2019), 200 mg (06/14/2019), 200 mg (07/04/2019) fosaprepitant  (EMEND) 150 mg, dexamethasone (DECADRON) 12 mg in sodium chloride 0.9 % 145 mL IVPB, , Intravenous,  Once, 4 of 4 cycles Administration:  (10/05/2018),  (11/16/2018),  (12/07/2018),  (10/26/2018)  for chemotherapy treatment.     CNS Oncologic History: 09/22/18: Completes SRS to 4 sub-cm metastases Carolyn [name])  Interval History: The patient's records from the referring physician were obtained and reviewed and the patient interviewed to confirm this HPI.  Carolyn [name] presents today for follow up after recent MRI brain.  She describes no new or progressive neurologic deficits.  No headaches or seizures.  Continues with Keytruda through Dr. Julien Nordmann.  Medications: Current Outpatient Medications on File Prior to Visit  Medication Sig Dispense Refill  . acetaminophen (TYLENOL) 500 MG tablet Take 1,000 mg by mouth every 8 (eight) hours as needed for mild pain, fever or headache.     Marland Kitchen amLODipine (NORVASC) 5 MG tablet Take 1 tablet by mouth once daily 90 tablet 2  . aspirin EC 81 MG tablet Take 81 mg by mouth daily.    . carvedilol (COREG) 25 MG tablet Take 1/2 (one-half) tablet by mouth twice daily 180 tablet 0  . cetirizine (ZYRTEC) 10 MG tablet Take 10 mg by mouth daily.    . Cholecalciferol (VITAMIN D-3) 125 MCG (5000 UT) TABS Take 5,000 Units by mouth daily.    . Chromium Picolinate 1000 MCG TABS Take 1,000 mg by mouth daily.    . folic acid (FOLVITE) 1 MG tablet Take 1 tablet (1 mg total) by mouth daily. 30 tablet 2  . glimepiride (AMARYL) 1 MG tablet Take 0.5 mg by mouth daily as needed.    . lidocaine-prilocaine (EMLA)  cream Apply to the Port-A-Cath site 30 to 60 minutes before chemotherapy. 30 g 2  . metFORMIN (GLUCOPHAGE) 500 MG tablet Take 500 mg by mouth 2 (two) times daily with a meal.     . methocarbamol (ROBAXIN) 500 MG tablet Take 1 tablet (500 mg total) by mouth every 6 (six) hours as needed for muscle spasms. 21 tablet 0  . nitroGLYCERIN (NITROSTAT) 0.4 MG SL tablet Place 1 tablet  (0.4 mg total) under the tongue every 5 (five) minutes as needed for chest pain. 25 tablet 2  . ONETOUCH ULTRA test strip USE 1 STRIP TO CHECK GLUCOSE THREE TIMES DAILY    . Pitavastatin Calcium (LIVALO) 4 MG TABS Take 4 mg by mouth at bedtime.     Carolyn [name] Glycol-Propyl Glycol (SYSTANE) 0.4-0.3 % SOLN Place 1 drop into both eyes every 6 (six) hours as needed (dry eyes).     . prochlorperazine (COMPAZINE) 10 MG tablet Take 1 tablet (10 mg total) by mouth every 6 (six) hours as needed for nausea or vomiting. 30 tablet 2  . psyllium (METAMUCIL) 58.6 % packet Take 1 packet by mouth at bedtime as needed (constipation).     Marland Kitchen telmisartan (MICARDIS) 40 MG tablet Take 40 mg by mouth daily.    Marland Kitchen zolpidem (AMBIEN) 10 MG tablet Take 2.5 mg by mouth at bedtime.      No current facility-administered medications on file prior to visit.    Allergies:  Allergies  Allergen Reactions  . Lisinopril Swelling    Lips and face swelled up.   Past Medical History:  Past Medical History:  Diagnosis Date  . Anginal pain ([place])    " with exertion "  . Arthritis    " 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 "  . Asthma    h/o  . Coronary artery disease   . Diabetes mellitus without complication ([place])    Type II  . GERD (gastroesophageal reflux disease)   . H/O hiatal hernia   . Heart murmur   . History of radiation therapy 09/22/2018   stereotactic radiosurgery   . 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 Hypertension   . nscl ca dx'd 08/07/18  . Persistent headaches    h/o  . Pneumonia    hx of PNA  . Shortness of breath   . Vaginal dryness    h/o   Past Surgical History:  Past Surgical History:  Procedure Laterality Date  . ABDOMINAL HYSTERECTOMY    . bone spur    . CARDIAC CATHETERIZATION  06/13/2012  . carpel tunnel surgery Left   . COLONOSCOPY     9 years ago  . CORONARY ANGIOPLASTY  06/13/2012  . EYE SURGERY     cyst left eye   . IR IMAGING GUIDED PORT INSERTION  10/10/2018  . LEFT HEART CATHETERIZATION WITH CORONARY  ANGIOGRAM N/A 06/13/2012   Procedure: LEFT HEART CATHETERIZATION WITH CORONARY ANGIOGRAM;  Surgeon: Laverda Page, MD;  Location: [facility] CATH LAB;  Service: Cardiovascular;  Laterality: N/A;  . NECK SURGERY    . rotator cuff surgery Right 2015  . VIDEO BRONCHOSCOPY WITH ENDOBRONCHIAL NAVIGATION N/A 09/06/2018   Procedure: VIDEO BRONCHOSCOPY WITH ENDOBRONCHIAL NAVIGATION, left upper lung;  Surgeon: Lajuana Matte, MD;  Location: [facility];  Service: Thoracic;  Laterality: N/A;  . VIDEO BRONCHOSCOPY WITH ENDOBRONCHIAL ULTRASOUND Left 09/06/2018   Procedure: VIDEO BRONCHOSCOPY WITH ENDOBRONCHIAL ULTRASOUND, left lung;  Surgeon: Lajuana Matte, MD;  Location: [facility];  Service: Thoracic;  Laterality: Left;  . WISDOM TOOTH EXTRACTION     Social History:  Social History   Socioeconomic History  . Marital status: Married    Spouse name: Not on file  . Number of children: 3  . Years of education: Not on file  . Highest education level: Not on file  Occupational History  . Not on file  Tobacco Use  . Smoking status: Former Smoker    Packs/day: 0.50    Years: 30.00    Pack years: 15.00    Quit date: 02/09/1999    Years since quitting: 20.4  . Smokeless tobacco: Never Used  Substance and Sexual Activity  . Alcohol use: No  . Drug use: No  . Sexual activity: Yes    Birth control/protection: Post-menopausal  Other Topics Concern  . Not on file  Social History Narrative  . Not on file   Social Determinants of Health   Financial Resource Strain:   . Difficulty of Paying Living Expenses:   Food Insecurity:   . Worried About Charity fundraiser in the Last Year:   . Arboriculturist in the Last Year:   Transportation Needs: No Transportation Needs  . Lack of Transportation (Medical): No  . Lack of Transportation (Non-Medical): No  Physical Activity:   . Days of Exercise per Week:   . Minutes of Exercise per Session:   Stress:   . Feeling of Stress :   Social Connections:   .  Frequency of Communication with Friends and Family:   . Frequency of Social Gatherings with Friends and Family:   . Attends Religious Services:   . Active Member of Clubs or Organizations:   . Attends Archivist Meetings:   Marland Kitchen Marital Status:   Intimate Partner Violence: Not At Risk  . Fear of Current or Ex-Partner: No  . Emotionally Abused: No  . Physically Abused: No  . Sexually Abused: No   Family History:  Family History  Problem Relation Age of Onset  . Breast cancer Other   . Diabetes Mother   . Glaucoma Mother   . Hypertension Mother   . Hypertension Father   . Asthma Father   . Diabetes Sister   . Diabetes Brother   . Hypertension Sister     Review of Systems: Constitutional: Doesn't report fevers, chills or abnormal weight loss Eyes: Doesn't report blurriness of vision Ears, nose, mouth, throat, and face: Doesn't report sore throat Respiratory: Doesn't report cough, dyspnea or wheezes Cardiovascular: Doesn't report palpitation, chest discomfort  Gastrointestinal:  Doesn't report nausea, constipation, diarrhea GU: Doesn't report incontinence Skin: Doesn't report skin rashes Neurological: Per HPI Musculoskeletal: Doesn't report joint pain Behavioral/Psych: Doesn't report anxiety  Physical Exam: Vitals:   07/05/19 1135 07/05/19 1149  BP: (!) 153/59 138/70  Pulse: 60   Resp: 18   Temp: 98.5 F (36.9 C)   SpO2: 100%    KPS: 90. General: Alert, cooperative, pleasant, in no acute distress Head: Normal EENT: No conjunctival injection or scleral icterus.  Lungs: Resp effort normal Cardiac: Regular rate Abdomen: Non-distended abdomen Skin: No rashes cyanosis or petechiae. Extremities: No clubbing or edema  Neurologic Exam: Mental Status: Awake, alert, attentive to examiner. Oriented to self and environment. Language is fluent with intact comprehension.  Cranial Nerves: Visual acuity is grossly normal. Visual fields are full. Extra-ocular movements  intact. No ptosis. Face is symmetric Motor: Tone and bulk are normal. Power is full in both arms and legs. Reflexes are  symmetric, no pathologic reflexes present.  Sensory: Intact to light touch Gait: Normal.   Labs: I have reviewed the data as listed    Component Value Date/Time   NA 142 07/04/2019 0850   K 3.7 07/04/2019 0850   CL 106 07/04/2019 0850   CO2 26 07/04/2019 0850   GLUCOSE 109 (H) 07/04/2019 0850   BUN 16 07/04/2019 0850   CREATININE 1.81 (H) 07/04/2019 0850   CALCIUM 9.1 07/04/2019 0850   PROT 7.3 07/04/2019 0850   ALBUMIN 3.4 (L) 07/04/2019 0850   AST 18 07/04/2019 0850   ALT 10 07/04/2019 0850   ALKPHOS 70 07/04/2019 0850   BILITOT 0.3 07/04/2019 0850   GFRNONAA 29 (L) 07/04/2019 0850   GFRAA 33 (L) 07/04/2019 0850   Lab Results  Component Value Date   WBC 5.7 07/04/2019   NEUTROABS 3.5 07/04/2019   HGB 9.8 (L) 07/04/2019   HCT 32.3 (L) 07/04/2019   MCV 82.6 07/04/2019   PLT 237 07/04/2019    Imaging:  CT Abdomen Pelvis Wo Contrast  Result Date: 06/12/2019 CLINICAL DATA:  Restaging non-small cell lung cancer. Undergoing immunotherapy. EXAM: CT CHEST, ABDOMEN AND PELVIS WITHOUT CONTRAST TECHNIQUE: Multidetector CT imaging of the chest, abdomen and pelvis was performed following the standard protocol without IV contrast. COMPARISON:  CT scan 04/10/2019 FINDINGS: CT CHEST FINDINGS Cardiovascular: The heart is normal in size. Small stable pericardial effusion. Stable age advanced aortic calcifications. No aneurysm. Stable three-vessel coronary artery calcifications. The right-sided Port-A-Cath tip is in good position in the distal SVC. Mediastinum/Nodes: No mediastinal or hilar mass or adenopathy. The esophagus is grossly normal. Lungs/Pleura: Stable 10 mm nodular lesion in the left upper lobe on image 45/6. Surrounding interstitial changes likely radiation related. Stable 7 mm right upper lobe subpleural sub solid nodular lesion on image 38/6. Stable 3 mm left  lower lobe nodule on image 60/6 Stable 4 mm nodule along the right major fissure in the right lower lobe on image 61/6 which is likely a perifissural lymph node. No new pulmonary lesions or pulmonary nodules. No acute overlying pulmonary findings. Stable mild emphysematous changes. No pleural effusions or pleural nodules. Musculoskeletal: No breast masses, supraclavicular or axillary adenopathy. The thyroid gland appears normal. The bony thorax is intact. No worrisome bone lesions. CT ABDOMEN PELVIS FINDINGS Hepatobiliary: No focal hepatic lesions are identified without contrast. The gallbladder appears normal. No intra or extrahepatic biliary dilatation. Pancreas: No mass, inflammation or ductal dilatation. Spleen: Normal size. No focal lesions. Adrenals/Urinary Tract: The adrenal glands are normal. No renal, ureteral or bladder calculi or mass is identified without contrast. Stomach/Bowel: The stomach, duodenum, small bowel and colon are unremarkable. No acute inflammatory changes, mass lesions or obstructive findings. The terminal ileum is normal. The appendix is normal. Vascular/Lymphatic: Age advanced atherosclerotic calcifications involving the abdominal aorta, branch vessel ostia and iliac arteries. No aneurysm. No mesenteric or retroperitoneal mass or adenopathy Reproductive: The uterus is surgically absent. Both ovaries are still present and appear normal. Other: No pelvic mass or adenopathy. No free pelvic fluid collections. No inguinal mass or adenopathy. No abdominal wall hernia or subcutaneous lesions. Musculoskeletal: No significant bony findings. Stable degenerative changes involving the lumbar spine with degenerative anterolisthesis of L4. IMPRESSION: 1. Stable 10 mm left upper lobe pulmonary nodule with surrounding interstitial changes, likely radiation related. 2. Stable small bilateral pulmonary nodules. 3. No mediastinal or hilar mass or adenopathy. 4. Stable small pericardial effusion. 5. No  findings for abdominal/pelvic metastatic disease or osseous metastatic disease.  6. Age advanced atherosclerotic calcifications involving the thoracic and abdominal aorta and branch vessels including the coronary arteries. Aortic Atherosclerosis (ICD10-I70.0). Electronically Signed   By: Marijo Sanes M.D.   On: 06/12/2019 15:12   CT Chest Wo Contrast  Result Date: 06/12/2019 CLINICAL DATA:  Restaging non-small cell lung cancer. Undergoing immunotherapy. EXAM: CT CHEST, ABDOMEN AND PELVIS WITHOUT CONTRAST TECHNIQUE: Multidetector CT imaging of the chest, abdomen and pelvis was performed following the standard protocol without IV contrast. COMPARISON:  CT scan 04/10/2019 FINDINGS: CT CHEST FINDINGS Cardiovascular: The heart is normal in size. Small stable pericardial effusion. Stable age advanced aortic calcifications. No aneurysm. Stable three-vessel coronary artery calcifications. The right-sided Port-A-Cath tip is in good position in the distal SVC. Mediastinum/Nodes: No mediastinal or hilar mass or adenopathy. The esophagus is grossly normal. Lungs/Pleura: Stable 10 mm nodular lesion in the left upper lobe on image 45/6. Surrounding interstitial changes likely radiation related. Stable 7 mm right upper lobe subpleural sub solid nodular lesion on image 38/6. Stable 3 mm left lower lobe nodule on image 60/6 Stable 4 mm nodule along the right major fissure in the right lower lobe on image 61/6 which is likely a perifissural lymph node. No new pulmonary lesions or pulmonary nodules. No acute overlying pulmonary findings. Stable mild emphysematous changes. No pleural effusions or pleural nodules. Musculoskeletal: No breast masses, supraclavicular or axillary adenopathy. The thyroid gland appears normal. The bony thorax is intact. No worrisome bone lesions. CT ABDOMEN PELVIS FINDINGS Hepatobiliary: No focal hepatic lesions are identified without contrast. The gallbladder appears normal. No intra or extrahepatic  biliary dilatation. Pancreas: No mass, inflammation or ductal dilatation. Spleen: Normal size. No focal lesions. Adrenals/Urinary Tract: The adrenal glands are normal. No renal, ureteral or bladder calculi or mass is identified without contrast. Stomach/Bowel: The stomach, duodenum, small bowel and colon are unremarkable. No acute inflammatory changes, mass lesions or obstructive findings. The terminal ileum is normal. The appendix is normal. Vascular/Lymphatic: Age advanced atherosclerotic calcifications involving the abdominal aorta, branch vessel ostia and iliac arteries. No aneurysm. No mesenteric or retroperitoneal mass or adenopathy Reproductive: The uterus is surgically absent. Both ovaries are still present and appear normal. Other: No pelvic mass or adenopathy. No free pelvic fluid collections. No inguinal mass or adenopathy. No abdominal wall hernia or subcutaneous lesions. Musculoskeletal: No significant bony findings. Stable degenerative changes involving the lumbar spine with degenerative anterolisthesis of L4. IMPRESSION: 1. Stable 10 mm left upper lobe pulmonary nodule with surrounding interstitial changes, likely radiation related. 2. Stable small bilateral pulmonary nodules. 3. No mediastinal or hilar mass or adenopathy. 4. Stable small pericardial effusion. 5. No findings for abdominal/pelvic metastatic disease or osseous metastatic disease. 6. Age advanced atherosclerotic calcifications involving the thoracic and abdominal aorta and branch vessels including the coronary arteries. Aortic Atherosclerosis (ICD10-I70.0). Electronically Signed   By: Marijo Sanes M.D.   On: 06/12/2019 15:12   MR Brain W Wo Contrast  Result Date: 07/02/2019 CLINICAL DATA:  Brain/CNS neoplasm, surveillance 3T SRS Protocol - follow-up of treated metastatic disease. EXAM: MRI HEAD WITHOUT AND WITH CONTRAST TECHNIQUE: Multiplanar, multiecho pulse sequences of the brain and surrounding structures were obtained without  and with intravenous contrast. CONTRAST:  61mL MULTIHANCE GADOBENATE DIMEGLUMINE 529 MG/ML IV SOLN COMPARISON:  MRI of the brain April 05, 2019 FINDINGS: Brain: No acute infarction, hemorrhage, hydrocephalus, extra-axial collection or mass lesion. Unchanged encephalomalacia within the bilateral inferior cerebellar hemispheres, left greater than right. No focus of abnormal contrast enhancement seen. Vascular: Normal flow  voids. Skull and upper cervical spine: Postsurgical changes from ACDF noted in the visualized upper cervical spine. Sinuses/Orbits: Mild mucosal thickening of the bilateral ethmoid cells. The orbits are maintained. Other: Bilateral mastoid effusions. IMPRESSION: 1. No evidence of intracranial metastatic disease. 2. Unchanged encephalomalacia within the bilateral inferior cerebellar hemispheres, left greater than right. 3. Bilateral mastoid effusions. Electronically Signed   By: Pedro Earls M.D.   On: 07/02/2019 13:07    [facility] Clinician Interpretation: I have personally reviewed the radiological images as listed.  My interpretation, in the context of the patient's clinical presentation, is stable disease   Assessment/Plan Brain metastases ([place]) [C79.31]  Carolyn [name] is clinicallly and radiographically stable today.   We ask that Carolyn [name] return to clinic in 3 months following next brain MRI, or sooner as needed.  We spent twenty additional minutes teaching regarding the natural history, biology, and historical experience in the treatment of neurologic complications of cancer.   We appreciate the opportunity to participate in the care of Carolyn [name].   All questions were answered. The patient knows to call the clinic with any problems, questions or concerns. No barriers to learning were detected.  The total time spent in the encounter was 40 minutes and more than 50% was on counseling and review of test results   Ventura Sellers,  MD Medical Director of Neuro-Oncology [facility] at [place] 07/05/19 11:31 AM

## 2019-07-10 NOTE — Addendum Note (Signed)
Addended by: Ventura Sellers on: 07/10/2019 02:59 PM   Modules accepted: Orders

## 2019-07-26 ENCOUNTER — Encounter: Payer: Self-pay | Admitting: Internal Medicine

## 2019-07-26 ENCOUNTER — Other Ambulatory Visit: Payer: Self-pay

## 2019-07-26 ENCOUNTER — Inpatient Hospital Stay: Payer: Medicare Other | Attending: Physician Assistant | Admitting: Internal Medicine

## 2019-07-26 ENCOUNTER — Inpatient Hospital Stay: Payer: Medicare Other

## 2019-07-26 VITALS — BP 133/65 | HR 59 | Temp 97.7°F | Resp 18 | Ht 60.0 in | Wt 173.6 lb

## 2019-07-26 DIAGNOSIS — C3491 Malignant neoplasm of unspecified part of right bronchus or lung: Secondary | ICD-10-CM

## 2019-07-26 DIAGNOSIS — C3492 Malignant neoplasm of unspecified part of left bronchus or lung: Secondary | ICD-10-CM

## 2019-07-26 DIAGNOSIS — I25118 Atherosclerotic heart disease of native coronary artery with other forms of angina pectoris: Secondary | ICD-10-CM

## 2019-07-26 DIAGNOSIS — Z5112 Encounter for antineoplastic immunotherapy: Secondary | ICD-10-CM

## 2019-07-26 DIAGNOSIS — C7931 Secondary malignant neoplasm of brain: Secondary | ICD-10-CM

## 2019-07-26 DIAGNOSIS — C3412 Malignant neoplasm of upper lobe, left bronchus or lung: Secondary | ICD-10-CM | POA: Insufficient documentation

## 2019-07-26 DIAGNOSIS — Z95828 Presence of other vascular implants and grafts: Secondary | ICD-10-CM

## 2019-07-26 LAB — CMP (CANCER CENTER ONLY)
ALT: 9 U/L (ref 0–44)
AST: 17 U/L (ref 15–41)
Albumin: 3.5 g/dL (ref 3.5–5.0)
Alkaline Phosphatase: 74 U/L (ref 38–126)
Anion gap: 12 (ref 5–15)
BUN: 21 mg/dL (ref 8–23)
CO2: 24 mmol/L (ref 22–32)
Calcium: 9.2 mg/dL (ref 8.9–10.3)
Chloride: 105 mmol/L (ref 98–111)
Creatinine: 1.95 mg/dL — ABNORMAL HIGH (ref 0.44–1.00)
GFR, Est AFR Am: 31 mL/min — ABNORMAL LOW (ref >60)
GFR, Estimated: 26 mL/min — ABNORMAL LOW (ref >60)
Glucose, Bld: 135 mg/dL — ABNORMAL HIGH (ref 70–99)
Potassium: 3.9 mmol/L (ref 3.5–5.1)
Sodium: 141 mmol/L (ref 135–145)
Total Bilirubin: 0.3 mg/dL (ref 0.3–1.2)
Total Protein: 7.3 g/dL (ref 6.5–8.1)

## 2019-07-26 LAB — CBC WITH DIFFERENTIAL (CANCER CENTER ONLY)
Abs Immature Granulocytes: 0.01 10*3/uL (ref 0.00–0.07)
Basophils Absolute: 0 10*3/uL (ref 0.0–0.1)
Basophils Relative: 1 %
Eosinophils Absolute: 0.3 10*3/uL (ref 0.0–0.5)
Eosinophils Relative: 5 %
HCT: 34 % — ABNORMAL LOW (ref 36.0–46.0)
Hemoglobin: 10.3 g/dL — ABNORMAL LOW (ref 12.0–15.0)
Immature Granulocytes: 0 %
Lymphocytes Relative: 26 %
Lymphs Abs: 1.5 10*3/uL (ref 0.7–4.0)
MCH: 24.4 pg — ABNORMAL LOW (ref 26.0–34.0)
MCHC: 30.3 g/dL (ref 30.0–36.0)
MCV: 80.6 fL (ref 80.0–100.0)
Monocytes Absolute: 0.6 10*3/uL (ref 0.1–1.0)
Monocytes Relative: 10 %
Neutro Abs: 3.4 10*3/uL (ref 1.7–7.7)
Neutrophils Relative %: 58 %
Platelet Count: 232 10*3/uL (ref 150–400)
RBC: 4.22 MIL/uL (ref 3.87–5.11)
RDW: 15.3 % (ref 11.5–15.5)
WBC Count: 5.9 10*3/uL (ref 4.0–10.5)
nRBC: 0 % (ref 0.0–0.2)

## 2019-07-26 MED ORDER — SODIUM CHLORIDE 0.9% FLUSH
10.0000 mL | INTRAVENOUS | Status: DC | PRN
  Administered 2019-07-26: 10 mL
  Filled 2019-07-26: qty 10

## 2019-07-26 MED ORDER — HEPARIN SOD (PORK) LOCK FLUSH 100 UNIT/ML IV SOLN
500.0000 [IU] | Freq: Once | INTRAVENOUS | Status: AC | PRN
  Administered 2019-07-26: 500 [IU]
  Filled 2019-07-26: qty 5

## 2019-07-26 MED ORDER — SODIUM CHLORIDE 0.9 % IV SOLN
Freq: Once | INTRAVENOUS | Status: AC
  Filled 2019-07-26: qty 250

## 2019-07-26 MED ORDER — SODIUM CHLORIDE 0.9 % IV SOLN
200.0000 mg | Freq: Once | INTRAVENOUS | Status: AC
  Administered 2019-07-26: 200 mg via INTRAVENOUS
  Filled 2019-07-26: qty 8

## 2019-07-26 NOTE — Patient Instructions (Signed)
[facility] Discharge Instructions for Patients Receiving Chemotherapy  Today you received the following chemotherapy agents:  Keytruda.  To help prevent nausea and vomiting after your treatment, we encourage you to take your nausea medication as directed.   If you develop nausea and vomiting that is not controlled by your nausea medication, call the clinic.   BELOW ARE SYMPTOMS THAT SHOULD BE REPORTED IMMEDIATELY:  *FEVER GREATER THAN 100.5 F  *CHILLS WITH OR WITHOUT FEVER  NAUSEA AND VOMITING THAT IS NOT CONTROLLED WITH YOUR NAUSEA MEDICATION  *UNUSUAL SHORTNESS OF BREATH  *UNUSUAL BRUISING OR BLEEDING  TENDERNESS IN MOUTH AND THROAT WITH OR WITHOUT PRESENCE OF ULCERS  *URINARY PROBLEMS  *BOWEL PROBLEMS  UNUSUAL RASH Items with * indicate a potential emergency and should be followed up as soon as possible.  Feel free to call the clinic should you have any questions or concerns. The clinic phone number is [phone].  Please show the CHEMO ALERT CARD at check-in to the Emergency Department and triage nurse.    

## 2019-07-26 NOTE — Progress Notes (Signed)
[place] [phone]   [phone]  OFFICE PROGRESS NOTE  [name], MD [address] [place] [address]  DIAGNOSIS: Stage IV (T1c, N1, M1c ) non-small cell lung cancer, adenocarcinoma diagnosed in July 2020 and presented with left upper lobe lung nodule in addition to right upper lobe lung nodule with left hilar lymphadenopathy as well as multiple brain metastasis.  Biomarker Findings Microsatellite status - MS-Stable Tumor Mutational Burden - 4 Muts/Mb Genomic Findings For a complete list of the genes assayed, please refer to the Appendix. ATM G204* KRAS G12C PDGFRA P122T SMO R199W 7 Disease relevant genes with no reportable alterations: ALK, BRAF, EGFR, ERBB2, MET, RET, ROS1  PDL 1 expression was 1%  PRIOR THERAPY: SBRT to multiple brain metastasis under the care of Dr. Isidore Moos.  CURRENT THERAPY: Systemic chemotherapy with carboplatin for AUC of 5, Alimta 500 mg/M2 and Keytruda 200 mg IV every 3 weeks.  First dose October 05, 2018.  Status post 12 cycles.  Starting from cycle #5 the patient will be on treatment with maintenance Alimta 500 mg/M2 and Keytruda 200 mg IV every 3 weeks.  She has been on treatment with single agent Keytruda recently secondary to renal insufficiency.  INTERVAL HISTORY: Carolyn [name] 64 y.o. female returns to the clinic today for follow-up visit.  The patient is feeling fine today with no concerning complaints.  She denied having any chest pain, shortness of breath, cough or hemoptysis.  She denied having any fever or chills.  She continues to have mild fatigue.  The patient continues to tolerate her treatment with [facility] fairly well.  She is scheduled to see Dr. Moshe Cipro for evaluation of her renal insufficiency later today.  The patient is here today for evaluation before starting cycle #15 of her treatment.  MEDICAL HISTORY: Past Medical History:  Diagnosis Date  . Anginal pain  ([place])    " with exertion "  . Arthritis    " 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 "  . Asthma    h/o  . Coronary artery disease   . Diabetes mellitus without complication ([place])    Type II  . GERD (gastroesophageal reflux disease)   . H/O hiatal hernia   . Heart murmur   . History of radiation therapy 09/22/2018   stereotactic radiosurgery   . 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 Hypertension   . nscl ca dx'd 08/07/18  . Persistent headaches    h/o  . Pneumonia    hx of PNA  . Shortness of breath   . Vaginal dryness    h/o    ALLERGIES:  is allergic to lisinopril.  MEDICATIONS:  Current Outpatient Medications  Medication Sig Dispense Refill  . acetaminophen (TYLENOL) 500 MG tablet Take 1,000 mg by mouth every 8 (eight) hours as needed for mild pain, fever or headache.     Marland Kitchen amLODipine (NORVASC) 5 MG tablet Take 1 tablet by mouth once daily 90 tablet 2  . aspirin EC 81 MG tablet Take 81 mg by mouth daily.    . carvedilol (COREG) 25 MG tablet Take 1/2 (one-half) tablet by mouth twice daily 180 tablet 0  . cetirizine (ZYRTEC) 10 MG tablet Take 10 mg by mouth daily.    . Cholecalciferol (VITAMIN D-3) 125 MCG (5000 UT) TABS Take 5,000 Units by mouth daily.    . Chromium Picolinate 1000 MCG TABS Take 1,000 mg by mouth daily.    Marland Kitchen  folic acid (FOLVITE) 1 MG tablet Take 1 tablet (1 mg total) by mouth daily. 30 tablet 2  . glimepiride (AMARYL) 1 MG tablet Take 0.5 mg by mouth daily as needed.    . lidocaine-prilocaine (EMLA) cream Apply to the Port-A-Cath site 30 to 60 minutes before chemotherapy. 30 g 2  . metFORMIN (GLUCOPHAGE) 500 MG tablet Take 500 mg by mouth 2 (two) times daily with a meal.     . methocarbamol (ROBAXIN) 500 MG tablet Take 1 tablet (500 mg total) by mouth every 6 (six) hours as needed for muscle spasms. 21 tablet 0  . nitroGLYCERIN (NITROSTAT) 0.4 MG SL tablet Place 1 tablet (0.4 mg total) under the tongue every 5 (five) minutes as needed for chest pain. 25 tablet 2  . ONETOUCH ULTRA test strip USE  1 STRIP TO CHECK GLUCOSE THREE TIMES DAILY    . Pitavastatin Calcium (LIVALO) 4 MG TABS Take 4 mg by mouth at bedtime.     Vladimir Faster Glycol-Propyl Glycol (SYSTANE) 0.4-0.3 % SOLN Place 1 drop into both eyes every 6 (six) hours as needed (dry eyes).     . prochlorperazine (COMPAZINE) 10 MG tablet Take 1 tablet (10 mg total) by mouth every 6 (six) hours as needed for nausea or vomiting. 30 tablet 2  . psyllium (METAMUCIL) 58.6 % packet Take 1 packet by mouth at bedtime as needed (constipation).     Marland Kitchen telmisartan (MICARDIS) 40 MG tablet Take 40 mg by mouth daily.    Marland Kitchen zolpidem (AMBIEN) 10 MG tablet Take 2.5 mg by mouth at bedtime.      No current facility-administered medications for this visit.   Facility-Administered Medications Ordered in Other Visits  Medication Dose Route Frequency Provider Last Rate Last Admin  . sodium chloride flush (NS) 0.9 % injection 10 mL  10 mL Intracatheter PRN Curt Bears, MD   10 mL at 07/26/19 6606    SURGICAL HISTORY:  Past Surgical History:  Procedure Laterality Date  . ABDOMINAL HYSTERECTOMY    . bone spur    . CARDIAC CATHETERIZATION  06/13/2012  . carpel tunnel surgery Left   . COLONOSCOPY     9 years ago  . CORONARY ANGIOPLASTY  06/13/2012  . EYE SURGERY     cyst left eye   . IR IMAGING GUIDED PORT INSERTION  10/10/2018  . LEFT HEART CATHETERIZATION WITH CORONARY ANGIOGRAM N/A 06/13/2012   Procedure: LEFT HEART CATHETERIZATION WITH CORONARY ANGIOGRAM;  Surgeon: Laverda Page, MD;  Location: [facility] CATH LAB;  Service: Cardiovascular;  Laterality: N/A;  . NECK SURGERY    . rotator cuff surgery Right 2015  . VIDEO BRONCHOSCOPY WITH ENDOBRONCHIAL NAVIGATION N/A 09/06/2018   Procedure: VIDEO BRONCHOSCOPY WITH ENDOBRONCHIAL NAVIGATION, left upper lung;  Surgeon: Lajuana Matte, MD;  Location: [facility];  Service: Thoracic;  Laterality: N/A;  . VIDEO BRONCHOSCOPY WITH ENDOBRONCHIAL ULTRASOUND Left 09/06/2018   Procedure: VIDEO BRONCHOSCOPY WITH  ENDOBRONCHIAL ULTRASOUND, left lung;  Surgeon: Lajuana Matte, MD;  Location: [facility];  Service: Thoracic;  Laterality: Left;  . WISDOM TOOTH EXTRACTION      REVIEW OF SYSTEMS:  A comprehensive review of systems was negative except for: Constitutional: positive for fatigue   PHYSICAL EXAMINATION: General appearance: alert, cooperative, fatigued and no distress Head: Normocephalic, without obvious abnormality, atraumatic Neck: no adenopathy, no JVD, supple, symmetrical, trachea midline and thyroid not enlarged, symmetric, no tenderness/mass/nodules Lymph nodes: Cervical, supraclavicular, and axillary nodes normal. Resp: clear to auscultation bilaterally Back: symmetric, no  curvature. ROM normal. No CVA tenderness. Cardio: regular rate and rhythm, S1, S2 normal, no murmur, click, rub or gallop GI: soft, non-tender; bowel sounds normal; no masses,  no organomegaly Extremities: extremities normal, atraumatic, no cyanosis or edema  ECOG PERFORMANCE STATUS: 1 - Symptomatic but completely ambulatory  Blood pressure 133/65, pulse (!) 59, temperature 97.7 F (36.5 C), temperature source Temporal, resp. rate 18, height 5' (1.524 m), weight 173 lb 9.6 oz (78.7 kg), SpO2 100 %.  LABORATORY DATA: Lab Results  Component Value Date   WBC 5.7 07/04/2019   HGB 9.8 (L) 07/04/2019   HCT 32.3 (L) 07/04/2019   MCV 82.6 07/04/2019   PLT 237 07/04/2019      Chemistry      Component Value Date/Time   NA 142 07/04/2019 0850   K 3.7 07/04/2019 0850   CL 106 07/04/2019 0850   CO2 26 07/04/2019 0850   BUN 16 07/04/2019 0850   CREATININE 1.81 (H) 07/04/2019 0850      Component Value Date/Time   CALCIUM 9.1 07/04/2019 0850   ALKPHOS 70 07/04/2019 0850   AST 18 07/04/2019 0850   ALT 10 07/04/2019 0850   BILITOT 0.3 07/04/2019 0850       RADIOGRAPHIC STUDIES: MR Brain W Wo Contrast  Result Date: 07/02/2019 CLINICAL DATA:  Brain/CNS neoplasm, surveillance 3T SRS Protocol - follow-up of  treated metastatic disease. EXAM: MRI HEAD WITHOUT AND WITH CONTRAST TECHNIQUE: Multiplanar, multiecho pulse sequences of the brain and surrounding structures were obtained without and with intravenous contrast. CONTRAST:  62m MULTIHANCE GADOBENATE DIMEGLUMINE 529 MG/ML IV SOLN COMPARISON:  MRI of the brain April 05, 2019 FINDINGS: Brain: No acute infarction, hemorrhage, hydrocephalus, extra-axial collection or mass lesion. Unchanged encephalomalacia within the bilateral inferior cerebellar hemispheres, left greater than right. No focus of abnormal contrast enhancement seen. Vascular: Normal flow voids. Skull and upper cervical spine: Postsurgical changes from ACDF noted in the visualized upper cervical spine. Sinuses/Orbits: Mild mucosal thickening of the bilateral ethmoid cells. The orbits are maintained. Other: Bilateral mastoid effusions. IMPRESSION: 1. No evidence of intracranial metastatic disease. 2. Unchanged encephalomalacia within the bilateral inferior cerebellar hemispheres, left greater than right. 3. Bilateral mastoid effusions. Electronically Signed   By: KPedro EarlsM.D.   On: 07/02/2019 13:07    ASSESSMENT AND PLAN: This is a very pleasant 64years old African-American female with likely stage IV (T1c, N1, M1C) lung cancer pending tissue diagnosis and presented with left upper lobe lung nodule in addition to right upper lobe pulmonary nodule as well as left hilar adenopathy and metastatic lesion to the brain. Molecular studies showed no actionable mutation and PDL 1 expression was 1%. The patient underwent SBRT to the metastatic brain lesion under the care of Dr. SIsidore Moos The patient is currently undergoing systemic chemotherapy with carboplatin for AUC of 5, Alimta 500 mg/M2 and Keytruda 200 mg IV every 3 weeks is status post 14 cycles.  Starting from cycle #5 she is on maintenance treatment with Alimta and Keytruda every 3 weeks with only Keytruda on the last few cycles  because of the renal insufficiency. I recommended for the patient to proceed with cycle #15 of her treatment today as planned.  This is likely to be with single agent Keytruda unless she has significant improvement of her renal insufficiency. The patient will come back for follow-up visit in 3 weeks for evaluation before the next cycle of her treatment. For the renal insufficiency, she is scheduled to see Dr.  Golds[place] from nephrology later today. The patient was advised to call immediately if she has any concerning symptoms in the interval. The patient voices understanding of current disease status and treatment options and is in agreement with the current care plan. All questions were answered. The patient knows to call the clinic with any problems, questions or concerns. We can certainly see the patient much sooner if necessary.  Disclaimer: This note was dictated with voice recognition software. Similar sounding words can inadvertently be transcribed and may not be corrected upon review.

## 2019-07-26 NOTE — Progress Notes (Signed)
Per Dr. Earlie Server, [place] to treat with elevated creatinine.

## 2019-08-10 ENCOUNTER — Telehealth: Payer: Self-pay | Admitting: Physician Assistant

## 2019-08-10 ENCOUNTER — Other Ambulatory Visit: Payer: Self-pay | Admitting: Physician Assistant

## 2019-08-10 DIAGNOSIS — C3492 Malignant neoplasm of unspecified part of left bronchus or lung: Secondary | ICD-10-CM

## 2019-08-10 NOTE — Telephone Encounter (Signed)
Received a refill request for folic acid. Called the patient and let her know that since she is no longer taking alimta, she does not need folic acid.

## 2019-08-15 NOTE — Progress Notes (Signed)
[place] OFFICE PROGRESS NOTE  [name], MD [address] [place] [address]  DIAGNOSIS: Stage IV(T1c, N1, M1 C)non-small cell lung cancer, adenocarcinoma. Shepresented with left upper lobe lung nodule in addition to right upper lobe lung nodule with left hilar lymphadenopathy as well as multiple brain metastasis.She was diagnosed in July of 2020  Biomarker Findings Microsatellite status - MS-Stable Tumor Mutational Burden - 4 Muts/Mb Genomic Findings For a complete list of the genes assayed, please refer to the Appendix. ATM G204* KRAS G12C PDGFRA P122T SMO R199W 7 Disease relevant genes with no reportable alterations: ALK, BRAF, EGFR, ERBB2, MET, RET, ROS1  PDL 1 expressionwas 1%  PRIOR THERAPY: SBRT to multiple brain metastasis under the care of Dr. Isidore Moos.Completed in August 2020.  CURRENT THERAPY: Systemic chemotherapy with Carboplatin for an AUC of 5, Alimta 500 mg/m2, and Keytruda 200 mg IV every 3 weeks. Status post15cyclesof treatment. First dose on 10/05/2018. She started maintenance Keytruda and Alimta starting from cycle #5. Alimta was discontinued due to renal insufficiency.   INTERVAL HISTORY: Carolyn [name] 64 y.o. female returns to the clinic for a follow up visit. The patient is feeling well today without any concerning complaints. The patient recently met with Dr. Moshe Cipro  from nephrology for her renal insufficiency. They are going to continue to monitor her for now. The patient continues to tolerate treatment with single agent Alimta well without any adverse side effects. Denies any fever, chills, night sweats, or weight loss. Denies any chest pain, shortness of breath, cough, or hemoptysis. Denies any nausea, vomiting, diarrhea, or constipation. Denies any headache or visual changes. Denies any rashes or skin changes. The patient is here today for evaluation prior to starting cycle # 16.  MEDICAL HISTORY: Past  Medical History:  Diagnosis Date  . Anginal pain ([place])    " with exertion "  . Arthritis    " 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 "  . Asthma    h/o  . Coronary artery disease   . Diabetes mellitus without complication ([place])    Type II  . GERD (gastroesophageal reflux disease)   . H/O hiatal hernia   . Heart murmur   . History of radiation therapy 09/22/2018   stereotactic radiosurgery   . 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 Hypertension   . nscl ca dx'd 08/07/18  . Persistent headaches    h/o  . Pneumonia    hx of PNA  . Shortness of breath   . Vaginal dryness    h/o    ALLERGIES:  is allergic to lisinopril.  MEDICATIONS:  Current Outpatient Medications  Medication Sig Dispense Refill  . acetaminophen (TYLENOL) 500 MG tablet Take 1,000 mg by mouth every 8 (eight) hours as needed for mild pain, fever or headache.     Marland Kitchen amLODipine (NORVASC) 5 MG tablet Take 1 tablet by mouth once daily 90 tablet 2  . aspirin EC 81 MG tablet Take 81 mg by mouth daily.    . carvedilol (COREG) 25 MG tablet Take 1/2 (one-half) tablet by mouth twice daily 180 tablet 0  . Cholecalciferol (VITAMIN D-3) 125 MCG (5000 UT) TABS Take 5,000 Units by mouth daily.    . Chromium Picolinate 1000 MCG TABS Take 1,000 mg by mouth daily.    Marland Kitchen lidocaine-prilocaine (EMLA) cream Apply to the Port-A-Cath site 30 to 60 minutes before chemotherapy. 30 g 2  . metFORMIN (GLUCOPHAGE) 500 MG tablet Take 500 mg by mouth  2 (two) times daily with a meal.     . ONETOUCH ULTRA test strip USE 1 STRIP TO CHECK GLUCOSE THREE TIMES DAILY    . Pitavastatin Calcium (LIVALO) 4 MG TABS Take 4 mg by mouth at bedtime.     Vladimir Faster Glycol-Propyl Glycol (SYSTANE) 0.4-0.3 % SOLN Place 1 drop into both eyes every 6 (six) hours as needed (dry eyes).     . psyllium (METAMUCIL) 58.6 % packet Take 1 packet by mouth at bedtime as needed (constipation).     Marland Kitchen telmisartan (MICARDIS) 40 MG tablet Take 40 mg by mouth daily.    . cetirizine (ZYRTEC) 10 MG tablet Take 10 mg by  mouth daily. (Patient not taking: Reported on 08/16/2019)    . folic acid (FOLVITE) 1 MG tablet Take 1 tablet (1 mg total) by mouth daily. (Patient not taking: Reported on 08/16/2019) 30 tablet 2  . glimepiride (AMARYL) 1 MG tablet Take 0.5 mg by mouth daily as needed. (Patient not taking: Reported on 07/26/2019)    . methocarbamol (ROBAXIN) 500 MG tablet Take 1 tablet (500 mg total) by mouth every 6 (six) hours as needed for muscle spasms. (Patient not taking: Reported on 08/16/2019) 21 tablet 0  . nitroGLYCERIN (NITROSTAT) 0.4 MG SL tablet Place 1 tablet (0.4 mg total) under the tongue every 5 (five) minutes as needed for chest pain. (Patient not taking: Reported on 08/16/2019) 25 tablet 2  . prochlorperazine (COMPAZINE) 10 MG tablet Take 1 tablet (10 mg total) by mouth every 6 (six) hours as needed for nausea or vomiting. (Patient not taking: Reported on 08/16/2019) 30 tablet 2  . zolpidem (AMBIEN) 10 MG tablet Take 2.5 mg by mouth at bedtime.  (Patient not taking: Reported on 08/16/2019)     No current facility-administered medications for this visit.   Facility-Administered Medications Ordered in Other Visits  Medication Dose Route Frequency Provider Last Rate Last Admin  . 0.9 %  sodium chloride infusion   Intravenous Once Curt Bears, MD      . pembrolizumab [facility]) 200 mg in sodium chloride 0.9 % 50 mL chemo infusion  200 mg Intravenous Once Curt Bears, MD        SURGICAL HISTORY:  Past Surgical History:  Procedure Laterality Date  . ABDOMINAL HYSTERECTOMY    . bone spur    . CARDIAC CATHETERIZATION  06/13/2012  . carpel tunnel surgery Left   . COLONOSCOPY     9 years ago  . CORONARY ANGIOPLASTY  06/13/2012  . EYE SURGERY     cyst left eye   . IR IMAGING GUIDED PORT INSERTION  10/10/2018  . LEFT HEART CATHETERIZATION WITH CORONARY ANGIOGRAM N/A 06/13/2012   Procedure: LEFT HEART CATHETERIZATION WITH CORONARY ANGIOGRAM;  Surgeon: Laverda Page, MD;  Location: [facility] CATH LAB;   Service: Cardiovascular;  Laterality: N/A;  . NECK SURGERY    . rotator cuff surgery Right 2015  . VIDEO BRONCHOSCOPY WITH ENDOBRONCHIAL NAVIGATION N/A 09/06/2018   Procedure: VIDEO BRONCHOSCOPY WITH ENDOBRONCHIAL NAVIGATION, left upper lung;  Surgeon: Lajuana Matte, MD;  Location: [facility];  Service: Thoracic;  Laterality: N/A;  . VIDEO BRONCHOSCOPY WITH ENDOBRONCHIAL ULTRASOUND Left 09/06/2018   Procedure: VIDEO BRONCHOSCOPY WITH ENDOBRONCHIAL ULTRASOUND, left lung;  Surgeon: Lajuana Matte, MD;  Location: [facility];  Service: Thoracic;  Laterality: Left;  . WISDOM TOOTH EXTRACTION      REVIEW OF SYSTEMS:   Review of Systems  Constitutional: Negative for appetite change, chills, fatigue, fever and unexpected  weight change.  HENT: Negative for mouth sores, nosebleeds, sore throat and trouble swallowing.   Eyes: Negative for eye problems and icterus.  Respiratory: Negative for cough, hemoptysis, shortness of breath and wheezing.   Cardiovascular: Negative for chest pain and leg swelling.  Gastrointestinal: Negative for abdominal pain, constipation, diarrhea, nausea and vomiting.  Genitourinary: Negative for bladder incontinence, difficulty urinating, dysuria, frequency and hematuria.   Musculoskeletal: Negative for back pain, gait problem, neck pain and neck stiffness.  Skin: Negative for itching and rash.  Neurological: Negative for dizziness, extremity weakness, gait problem, headaches, light-headedness and seizures.  Hematological: Negative for adenopathy. Does not bruise/bleed easily.  Psychiatric/Behavioral: Negative for confusion, depression and sleep disturbance. The patient is not nervous/anxious.     PHYSICAL EXAMINATION:  Blood pressure 134/67, pulse (!) 55, temperature (!) 97.5 F (36.4 C), temperature source Temporal, resp. rate 17, height 5' (1.524 m), weight 173 lb 11.2 oz (78.8 kg), SpO2 100 %.  ECOG PERFORMANCE STATUS: 1 - Symptomatic but completely  ambulatory  Physical Exam  Constitutional: Oriented to person, place, and time and well-developed, well-nourished, and in no distress.  HENT:  Head: Normocephalic and atraumatic.  Mouth/Throat: Oropharynx is clear and moist. No oropharyngeal exudate.  Eyes: Conjunctivae are normal. Right eye exhibits no discharge. Left eye exhibits no discharge. No scleral icterus.  Neck: Normal range of motion. Neck supple.  Cardiovascular: Normal rate, regular rhythm, normal heart sounds and intact distal pulses.   Pulmonary/Chest: Effort normal and breath sounds normal. No respiratory distress. No wheezes. No rales.  Abdominal: Soft. Bowel sounds are normal. Exhibits no distension and no mass. There is no tenderness.  Musculoskeletal: Normal range of motion. Exhibits no edema.  Lymphadenopathy:    No cervical adenopathy.  Neurological: Alert and oriented to person, place, and time. Exhibits normal muscle tone. Gait normal. Coordination normal.  Skin: Skin is warm and dry. No rash noted. Not diaphoretic. No erythema. No pallor.  Psychiatric: Mood, memory and judgment normal.  Vitals reviewed.  LABORATORY DATA: Lab Results  Component Value Date   WBC 5.6 08/16/2019   HGB 10.5 (L) 08/16/2019   HCT 34.0 (L) 08/16/2019   MCV 81.1 08/16/2019   PLT 260 08/16/2019      Chemistry      Component Value Date/Time   NA 143 08/16/2019 0906   K 4.0 08/16/2019 0906   CL 105 08/16/2019 0906   CO2 27 08/16/2019 0906   BUN 17 08/16/2019 0906   CREATININE 1.84 (H) 08/16/2019 0906      Component Value Date/Time   CALCIUM 9.6 08/16/2019 0906   ALKPHOS 74 08/16/2019 0906   AST 19 08/16/2019 0906   ALT 13 08/16/2019 0906   BILITOT 0.3 08/16/2019 0906       RADIOGRAPHIC STUDIES:  No results found.   ASSESSMENT/PLAN:  This is a very pleasant 64 year old African-American female with stage IV (T1c, N1,M1c)lung cancer. She presented with a dominant left upper lobe lung nodule in addition to a right  upper lobe pulmonary nodule with left hilar lymphadenopathy. She also has metastatic disease to the brain.She has no actionable mutations and her PDL1 expression is 1%. She was diagnosed in July 2020.   The patient completed SBRT to the metastatic brain lesion under the care of Dr. Isidore Moos in August 2020.   The patient is currently undergoing systemic chemotherapy withcarboplatin for an AUC of 5, Alimta 500 mg/m, and Keytruda 200 mg IV every 3 weeks. She is status post15cyclesof treatment. Starting from cycle #5,  she has been on maintenance Alimta 500 mg/m2 and Keytruda 200 mg IV.She tolerated her treatment well without anyconcerningadverse side effects.Alimta was discontinued due to renal insufficiency.   Labs were reviewed. Recommend that she proceed with cycle #16 today as scheduled.   I will arrange for a restaging CT scan of the chest, abdomen, and pelvis prior to her next cycle of treatment. This will be ordered without contrast due to her renal insufficiency.   We will see her back for a follow up visit in 3 weeks for evaluation before starting cycle #17.   The patient was advised to call immediately if she has any concerning symptoms in the interval. The patient voices understanding of current disease status and treatment options and is in agreement with the current care plan. All questions were answered. The patient knows to call the clinic with any problems, questions or concerns. We can certainly see the patient much sooner if necessary  Orders Placed This Encounter  Procedures  . CT Chest Wo Contrast    Standing Status:   Future    Standing Expiration Date:   08/15/2020    Order Specific Question:   ** REASON FOR EXAM (FREE TEXT)    Answer:   Restaging Lung Cancer    Order Specific Question:   Preferred imaging location?    Answer:   [facility]    Order Specific Question:   Radiology Contrast Protocol - do NOT remove file path    Answer:    [email or web address]  . CT Abdomen Pelvis Wo Contrast    Standing Status:   Future    Standing Expiration Date:   08/15/2020    Order Specific Question:   ** REASON FOR EXAM (FREE TEXT)    Answer:   Restaging Lung Cancer    Order Specific Question:   Preferred imaging location?    Answer:   [facility]    Order Specific Question:   Is Oral Contrast requested for this exam?    Answer:   Yes, Per Radiology protocol    Order Specific Question:   Radiology Contrast Protocol - do NOT remove file path    Answer:   [email or web address]     [place], PA-C 08/16/19

## 2019-08-16 ENCOUNTER — Inpatient Hospital Stay: Payer: Medicare Other

## 2019-08-16 ENCOUNTER — Inpatient Hospital Stay: Payer: Medicare Other | Attending: Physician Assistant | Admitting: Physician Assistant

## 2019-08-16 ENCOUNTER — Other Ambulatory Visit: Payer: Self-pay

## 2019-08-16 VITALS — BP 134/67 | HR 55 | Temp 97.5°F | Resp 17 | Ht 60.0 in | Wt 173.7 lb

## 2019-08-16 DIAGNOSIS — Z5112 Encounter for antineoplastic immunotherapy: Secondary | ICD-10-CM | POA: Diagnosis not present

## 2019-08-16 DIAGNOSIS — C3412 Malignant neoplasm of upper lobe, left bronchus or lung: Secondary | ICD-10-CM | POA: Diagnosis not present

## 2019-08-16 DIAGNOSIS — C7931 Secondary malignant neoplasm of brain: Secondary | ICD-10-CM | POA: Diagnosis not present

## 2019-08-16 DIAGNOSIS — C3492 Malignant neoplasm of unspecified part of left bronchus or lung: Secondary | ICD-10-CM | POA: Diagnosis not present

## 2019-08-16 DIAGNOSIS — Z95828 Presence of other vascular implants and grafts: Secondary | ICD-10-CM

## 2019-08-16 LAB — CMP (CANCER CENTER ONLY)
ALT: 13 U/L (ref 0–44)
AST: 19 U/L (ref 15–41)
Albumin: 3.5 g/dL (ref 3.5–5.0)
Alkaline Phosphatase: 74 U/L (ref 38–126)
Anion gap: 11 (ref 5–15)
BUN: 17 mg/dL (ref 8–23)
CO2: 27 mmol/L (ref 22–32)
Calcium: 9.6 mg/dL (ref 8.9–10.3)
Chloride: 105 mmol/L (ref 98–111)
Creatinine: 1.84 mg/dL — ABNORMAL HIGH (ref 0.44–1.00)
GFR, Est AFR Am: 33 mL/min — ABNORMAL LOW (ref >60)
GFR, Estimated: 28 mL/min — ABNORMAL LOW (ref >60)
Glucose, Bld: 107 mg/dL — ABNORMAL HIGH (ref 70–99)
Potassium: 4 mmol/L (ref 3.5–5.1)
Sodium: 143 mmol/L (ref 135–145)
Total Bilirubin: 0.3 mg/dL (ref 0.3–1.2)
Total Protein: 7.4 g/dL (ref 6.5–8.1)

## 2019-08-16 LAB — CBC WITH DIFFERENTIAL (CANCER CENTER ONLY)
Abs Immature Granulocytes: 0.01 10*3/uL (ref 0.00–0.07)
Basophils Absolute: 0 10*3/uL (ref 0.0–0.1)
Basophils Relative: 1 %
Eosinophils Absolute: 0.3 10*3/uL (ref 0.0–0.5)
Eosinophils Relative: 6 %
HCT: 34 % — ABNORMAL LOW (ref 36.0–46.0)
Hemoglobin: 10.5 g/dL — ABNORMAL LOW (ref 12.0–15.0)
Immature Granulocytes: 0 %
Lymphocytes Relative: 26 %
Lymphs Abs: 1.5 10*3/uL (ref 0.7–4.0)
MCH: 25.1 pg — ABNORMAL LOW (ref 26.0–34.0)
MCHC: 30.9 g/dL (ref 30.0–36.0)
MCV: 81.1 fL (ref 80.0–100.0)
Monocytes Absolute: 0.7 10*3/uL (ref 0.1–1.0)
Monocytes Relative: 12 %
Neutro Abs: 3.1 10*3/uL (ref 1.7–7.7)
Neutrophils Relative %: 55 %
Platelet Count: 260 10*3/uL (ref 150–400)
RBC: 4.19 MIL/uL (ref 3.87–5.11)
RDW: 15.8 % — ABNORMAL HIGH (ref 11.5–15.5)
WBC Count: 5.6 10*3/uL (ref 4.0–10.5)
nRBC: 0 % (ref 0.0–0.2)

## 2019-08-16 MED ORDER — HEPARIN SOD (PORK) LOCK FLUSH 100 UNIT/ML IV SOLN
500.0000 [IU] | Freq: Once | INTRAVENOUS | Status: AC | PRN
  Administered 2019-08-16: 500 [IU]
  Filled 2019-08-16: qty 5

## 2019-08-16 MED ORDER — SODIUM CHLORIDE 0.9 % IV SOLN
200.0000 mg | Freq: Once | INTRAVENOUS | Status: AC
  Administered 2019-08-16: 200 mg via INTRAVENOUS
  Filled 2019-08-16: qty 8

## 2019-08-16 MED ORDER — SODIUM CHLORIDE 0.9% FLUSH
10.0000 mL | INTRAVENOUS | Status: DC | PRN
  Administered 2019-08-16: 10 mL
  Filled 2019-08-16: qty 10

## 2019-08-16 MED ORDER — SODIUM CHLORIDE 0.9 % IV SOLN
Freq: Once | INTRAVENOUS | Status: AC
  Filled 2019-08-16: qty 250

## 2019-08-16 NOTE — Patient Instructions (Signed)

## 2019-08-16 NOTE — Progress Notes (Signed)
Per Cassandra Hellingoetter, PA-C, Ok to treat with elevated creatinine.

## 2019-08-16 NOTE — Patient Instructions (Signed)
[facility] Discharge Instructions for Patients Receiving Chemotherapy  Today you received the following chemotherapy agents:  Keytruda.  To help prevent nausea and vomiting after your treatment, we encourage you to take your nausea medication as directed.   If you develop nausea and vomiting that is not controlled by your nausea medication, call the clinic.   BELOW ARE SYMPTOMS THAT SHOULD BE REPORTED IMMEDIATELY:  *FEVER GREATER THAN 100.5 F  *CHILLS WITH OR WITHOUT FEVER  NAUSEA AND VOMITING THAT IS NOT CONTROLLED WITH YOUR NAUSEA MEDICATION  *UNUSUAL SHORTNESS OF BREATH  *UNUSUAL BRUISING OR BLEEDING  TENDERNESS IN MOUTH AND THROAT WITH OR WITHOUT PRESENCE OF ULCERS  *URINARY PROBLEMS  *BOWEL PROBLEMS  UNUSUAL RASH Items with * indicate a potential emergency and should be followed up as soon as possible.  Feel free to call the clinic should you have any questions or concerns. The clinic phone number is [phone].  Please show the CHEMO ALERT CARD at check-in to the Emergency Department and triage nurse.    

## 2019-08-17 ENCOUNTER — Other Ambulatory Visit: Payer: Medicare Other

## 2019-08-23 DIAGNOSIS — E1122 Type 2 diabetes mellitus with diabetic chronic kidney disease: Secondary | ICD-10-CM | POA: Diagnosis not present

## 2019-08-23 DIAGNOSIS — I2510 Atherosclerotic heart disease of native coronary artery without angina pectoris: Secondary | ICD-10-CM | POA: Diagnosis not present

## 2019-08-23 DIAGNOSIS — C3492 Malignant neoplasm of unspecified part of left bronchus or lung: Secondary | ICD-10-CM | POA: Diagnosis not present

## 2019-08-23 DIAGNOSIS — N183 Chronic kidney disease, stage 3 unspecified: Secondary | ICD-10-CM | POA: Diagnosis not present

## 2019-09-04 ENCOUNTER — Ambulatory Visit (HOSPITAL_COMMUNITY)
Admission: RE | Admit: 2019-09-04 | Discharge: 2019-09-04 | Disposition: A | Discharge status: Home / Self Care | Payer: Medicare Other | Source: Ambulatory Visit | Attending: Physician Assistant | Admitting: Physician Assistant

## 2019-09-04 ENCOUNTER — Other Ambulatory Visit: Payer: Self-pay

## 2019-09-04 ENCOUNTER — Encounter (HOSPITAL_COMMUNITY): Payer: Self-pay

## 2019-09-04 DIAGNOSIS — C3492 Malignant neoplasm of unspecified part of left bronchus or lung: Secondary | ICD-10-CM | POA: Diagnosis not present

## 2019-09-04 DIAGNOSIS — C3490 Malignant neoplasm of unspecified part of unspecified bronchus or lung: Secondary | ICD-10-CM | POA: Diagnosis not present

## 2019-09-06 ENCOUNTER — Other Ambulatory Visit: Payer: Self-pay

## 2019-09-06 ENCOUNTER — Inpatient Hospital Stay: Payer: Medicare Other

## 2019-09-06 ENCOUNTER — Inpatient Hospital Stay (HOSPITAL_BASED_OUTPATIENT_CLINIC_OR_DEPARTMENT_OTHER): Payer: Medicare Other | Admitting: Internal Medicine

## 2019-09-06 ENCOUNTER — Encounter: Payer: Self-pay | Admitting: Internal Medicine

## 2019-09-06 VITALS — BP 127/70 | HR 69 | Temp 98.2°F | Resp 18 | Ht 60.0 in | Wt 174.2 lb

## 2019-09-06 DIAGNOSIS — C3492 Malignant neoplasm of unspecified part of left bronchus or lung: Secondary | ICD-10-CM

## 2019-09-06 DIAGNOSIS — I10 Essential (primary) hypertension: Secondary | ICD-10-CM | POA: Diagnosis not present

## 2019-09-06 DIAGNOSIS — C3412 Malignant neoplasm of upper lobe, left bronchus or lung: Secondary | ICD-10-CM | POA: Diagnosis not present

## 2019-09-06 DIAGNOSIS — C7931 Secondary malignant neoplasm of brain: Secondary | ICD-10-CM

## 2019-09-06 DIAGNOSIS — R7989 Other specified abnormal findings of blood chemistry: Secondary | ICD-10-CM

## 2019-09-06 DIAGNOSIS — C3491 Malignant neoplasm of unspecified part of right bronchus or lung: Secondary | ICD-10-CM | POA: Diagnosis not present

## 2019-09-06 DIAGNOSIS — Z95828 Presence of other vascular implants and grafts: Secondary | ICD-10-CM

## 2019-09-06 DIAGNOSIS — I25118 Atherosclerotic heart disease of native coronary artery with other forms of angina pectoris: Secondary | ICD-10-CM

## 2019-09-06 DIAGNOSIS — Z5112 Encounter for antineoplastic immunotherapy: Secondary | ICD-10-CM

## 2019-09-06 LAB — CMP (CANCER CENTER ONLY)
ALT: 8 U/L (ref 0–44)
AST: 17 U/L (ref 15–41)
Albumin: 3.5 g/dL (ref 3.5–5.0)
Alkaline Phosphatase: 75 U/L (ref 38–126)
Anion gap: 10 (ref 5–15)
BUN: 14 mg/dL (ref 8–23)
CO2: 25 mmol/L (ref 22–32)
Calcium: 10.2 mg/dL (ref 8.9–10.3)
Chloride: 107 mmol/L (ref 98–111)
Creatinine: 1.9 mg/dL — ABNORMAL HIGH (ref 0.44–1.00)
GFR, Est AFR Am: 31 mL/min — ABNORMAL LOW (ref >60)
GFR, Estimated: 27 mL/min — ABNORMAL LOW (ref >60)
Glucose, Bld: 111 mg/dL — ABNORMAL HIGH (ref 70–99)
Potassium: 3.8 mmol/L (ref 3.5–5.1)
Sodium: 142 mmol/L (ref 135–145)
Total Bilirubin: 0.3 mg/dL (ref 0.3–1.2)
Total Protein: 7.4 g/dL (ref 6.5–8.1)

## 2019-09-06 LAB — CBC WITH DIFFERENTIAL (CANCER CENTER ONLY)
Abs Immature Granulocytes: 0.01 10*3/uL (ref 0.00–0.07)
Basophils Absolute: 0 10*3/uL (ref 0.0–0.1)
Basophils Relative: 1 %
Eosinophils Absolute: 0.4 10*3/uL (ref 0.0–0.5)
Eosinophils Relative: 6 %
HCT: 33.9 % — ABNORMAL LOW (ref 36.0–46.0)
Hemoglobin: 10.4 g/dL — ABNORMAL LOW (ref 12.0–15.0)
Immature Granulocytes: 0 %
Lymphocytes Relative: 23 %
Lymphs Abs: 1.4 10*3/uL (ref 0.7–4.0)
MCH: 24.2 pg — ABNORMAL LOW (ref 26.0–34.0)
MCHC: 30.7 g/dL (ref 30.0–36.0)
MCV: 79 fL — ABNORMAL LOW (ref 80.0–100.0)
Monocytes Absolute: 0.7 10*3/uL (ref 0.1–1.0)
Monocytes Relative: 11 %
Neutro Abs: 3.6 10*3/uL (ref 1.7–7.7)
Neutrophils Relative %: 59 %
Platelet Count: 224 10*3/uL (ref 150–400)
RBC: 4.29 MIL/uL (ref 3.87–5.11)
RDW: 15.9 % — ABNORMAL HIGH (ref 11.5–15.5)
WBC Count: 6.1 10*3/uL (ref 4.0–10.5)
nRBC: 0 % (ref 0.0–0.2)

## 2019-09-06 MED ORDER — SODIUM CHLORIDE 0.9% FLUSH
10.0000 mL | INTRAVENOUS | Status: DC | PRN
  Administered 2019-09-06: 10 mL
  Filled 2019-09-06: qty 10

## 2019-09-06 MED ORDER — SODIUM CHLORIDE 0.9 % IV SOLN
200.0000 mg | Freq: Once | INTRAVENOUS | Status: AC
  Administered 2019-09-06: 200 mg via INTRAVENOUS
  Filled 2019-09-06: qty 8

## 2019-09-06 MED ORDER — SODIUM CHLORIDE 0.9 % IV SOLN
Freq: Once | INTRAVENOUS | Status: AC
  Filled 2019-09-06: qty 250

## 2019-09-06 MED ORDER — HEPARIN SOD (PORK) LOCK FLUSH 100 UNIT/ML IV SOLN
500.0000 [IU] | Freq: Once | INTRAVENOUS | Status: AC | PRN
  Administered 2019-09-06: 500 [IU]
  Filled 2019-09-06: qty 5

## 2019-09-06 NOTE — Progress Notes (Signed)
[place] [phone]   [phone]  OFFICE PROGRESS NOTE  [name], MD [address] [place] [address]  DIAGNOSIS: Stage IV (T1c, N1, M1c ) non-small cell lung cancer, adenocarcinoma diagnosed in July 2020 and presented with left upper lobe lung nodule in addition to right upper lobe lung nodule with left hilar lymphadenopathy as well as multiple brain metastasis.  Biomarker Findings Microsatellite status - MS-Stable Tumor Mutational Burden - 4 Muts/Mb Genomic Findings For a complete list of the genes assayed, please refer to the Appendix. ATM G204* KRAS G12C PDGFRA P122T SMO R199W 7 Disease relevant genes with no reportable alterations: ALK, BRAF, EGFR, ERBB2, MET, RET, ROS1  PDL 1 expression was 1%  PRIOR THERAPY: SBRT to multiple brain metastasis under the care of Dr. Isidore Moos.  CURRENT THERAPY: Systemic chemotherapy with carboplatin for AUC of 5, Alimta 500 mg/M2 and Keytruda 200 mg IV every 3 weeks.  First dose October 05, 2018.  Status post 16 cycles.  Starting from cycle #5 the patient will be on treatment with maintenance Alimta 500 mg/M2 and Keytruda 200 mg IV every 3 weeks.  She has been on treatment with single agent Keytruda recently secondary to renal insufficiency.  INTERVAL HISTORY: Carolyn [name] 64 y.o. female returns to the clinic today for follow-up visit.  The patient is feeling fine today with no concerning complaints.  She has been tolerating her treatment with Keytruda fairly well.  The patient denied having any current chest pain, shortness of breath, cough or hemoptysis.  She denied having any fever or chills.  She has no nausea, vomiting, diarrhea or constipation.  She denied having any visual changes but has intermittent headache recently and she is scheduled to have MRI of the brain next months.  The patient had repeat CT scan of the chest, abdomen pelvis performed recently and she is here for  evaluation and discussion of her risk her results.  MEDICAL HISTORY: Past Medical History:  Diagnosis Date  . Anginal pain ([place])    " with exertion "  . Arthritis    " 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 "  . Asthma    h/o  . Coronary artery disease   . Diabetes mellitus without complication ([place])    Type II  . GERD (gastroesophageal reflux disease)   . H/O hiatal hernia   . Heart murmur   . History of radiation therapy 09/22/2018   stereotactic radiosurgery   . 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 Hypertension   . nscl ca dx'd 08/07/18  . Persistent headaches    h/o  . Pneumonia    hx of PNA  . Shortness of breath   . Vaginal dryness    h/o    ALLERGIES:  is allergic to lisinopril.  MEDICATIONS:  Current Outpatient Medications  Medication Sig Dispense Refill  . acetaminophen (TYLENOL) 500 MG tablet Take 1,000 mg by mouth every 8 (eight) hours as needed for mild pain, fever or headache.     Marland Kitchen amLODipine (NORVASC) 5 MG tablet Take 1 tablet by mouth once daily 90 tablet 2  . aspirin EC 81 MG tablet Take 81 mg by mouth daily.    . carvedilol (COREG) 25 MG tablet Take 1/2 (one-half) tablet by mouth twice daily 180 tablet 0  . cetirizine (ZYRTEC) 10 MG tablet Take 10 mg by mouth daily. (Patient not taking: Reported on 08/16/2019)    . Cholecalciferol (VITAMIN D-3) 125  MCG (5000 UT) TABS Take 5,000 Units by mouth daily.    . Chromium Picolinate 1000 MCG TABS Take 1,000 mg by mouth daily.    . folic acid (FOLVITE) 1 MG tablet Take 1 tablet (1 mg total) by mouth daily. (Patient not taking: Reported on 08/16/2019) 30 tablet 2  . glimepiride (AMARYL) 1 MG tablet Take 0.5 mg by mouth daily as needed. (Patient not taking: Reported on 07/26/2019)    . lidocaine-prilocaine (EMLA) cream Apply to the Port-A-Cath site 30 to 60 minutes before chemotherapy. 30 g 2  . metFORMIN (GLUCOPHAGE) 500 MG tablet Take 500 mg by mouth 2 (two) times daily with a meal.     . methocarbamol (ROBAXIN) 500 MG tablet Take 1 tablet (500 mg total)  by mouth every 6 (six) hours as needed for muscle spasms. (Patient not taking: Reported on 08/16/2019) 21 tablet 0  . nitroGLYCERIN (NITROSTAT) 0.4 MG SL tablet Place 1 tablet (0.4 mg total) under the tongue every 5 (five) minutes as needed for chest pain. (Patient not taking: Reported on 08/16/2019) 25 tablet 2  . ONETOUCH ULTRA test strip USE 1 STRIP TO CHECK GLUCOSE THREE TIMES DAILY    . Pitavastatin Calcium (LIVALO) 4 MG TABS Take 4 mg by mouth at bedtime.     Vladimir Faster Glycol-Propyl Glycol (SYSTANE) 0.4-0.3 % SOLN Place 1 drop into both eyes every 6 (six) hours as needed (dry eyes).     . prochlorperazine (COMPAZINE) 10 MG tablet Take 1 tablet (10 mg total) by mouth every 6 (six) hours as needed for nausea or vomiting. (Patient not taking: Reported on 08/16/2019) 30 tablet 2  . psyllium (METAMUCIL) 58.6 % packet Take 1 packet by mouth at bedtime as needed (constipation).     Marland Kitchen telmisartan (MICARDIS) 40 MG tablet Take 40 mg by mouth daily.    Marland Kitchen zolpidem (AMBIEN) 10 MG tablet Take 2.5 mg by mouth at bedtime.  (Patient not taking: Reported on 08/16/2019)     No current facility-administered medications for this visit.   Facility-Administered Medications Ordered in Other Visits  Medication Dose Route Frequency Provider Last Rate Last Admin  . sodium chloride flush (NS) 0.9 % injection 10 mL  10 mL Intracatheter PRN Curt Bears, MD   10 mL at 09/06/19 0831    SURGICAL HISTORY:  Past Surgical History:  Procedure Laterality Date  . ABDOMINAL HYSTERECTOMY    . bone spur    . CARDIAC CATHETERIZATION  06/13/2012  . carpel tunnel surgery Left   . COLONOSCOPY     9 years ago  . CORONARY ANGIOPLASTY  06/13/2012  . EYE SURGERY     cyst left eye   . IR IMAGING GUIDED PORT INSERTION  10/10/2018  . LEFT HEART CATHETERIZATION WITH CORONARY ANGIOGRAM N/A 06/13/2012   Procedure: LEFT HEART CATHETERIZATION WITH CORONARY ANGIOGRAM;  Surgeon: Laverda Page, MD;  Location: [facility] CATH LAB;  Service:  Cardiovascular;  Laterality: N/A;  . NECK SURGERY    . rotator cuff surgery Right 2015  . VIDEO BRONCHOSCOPY WITH ENDOBRONCHIAL NAVIGATION N/A 09/06/2018   Procedure: VIDEO BRONCHOSCOPY WITH ENDOBRONCHIAL NAVIGATION, left upper lung;  Surgeon: Lajuana Matte, MD;  Location: [place];  Service: Thoracic;  Laterality: N/A;  . VIDEO BRONCHOSCOPY WITH ENDOBRONCHIAL ULTRASOUND Left 09/06/2018   Procedure: VIDEO BRONCHOSCOPY WITH ENDOBRONCHIAL ULTRASOUND, left lung;  Surgeon: Lajuana Matte, MD;  Location: [place];  Service: Thoracic;  Laterality: Left;  . WISDOM TOOTH EXTRACTION      REVIEW OF  SYSTEMS:  Constitutional: positive for fatigue Eyes: negative Ears, nose, mouth, throat, and face: negative Respiratory: negative Cardiovascular: negative Gastrointestinal: negative Genitourinary:negative Integument/breast: negative Hematologic/lymphatic: negative Musculoskeletal:negative Neurological: negative Behavioral/Psych: negative Endocrine: negative Allergic/Immunologic: negative   PHYSICAL EXAMINATION: General appearance: alert, cooperative, fatigued and no distress Head: Normocephalic, without obvious abnormality, atraumatic Neck: no adenopathy, no JVD, supple, symmetrical, trachea midline and thyroid not enlarged, symmetric, no tenderness/mass/nodules Lymph nodes: Cervical, supraclavicular, and axillary nodes normal. Resp: clear to auscultation bilaterally Back: symmetric, no curvature. ROM normal. No CVA tenderness. Cardio: regular rate and rhythm, S1, S2 normal, no murmur, click, rub or gallop GI: soft, non-tender; bowel sounds normal; no masses,  no organomegaly Extremities: extremities normal, atraumatic, no cyanosis or edema Neurologic: Alert and oriented X 3, normal strength and tone. Normal symmetric reflexes. Normal coordination and gait  ECOG PERFORMANCE STATUS: 1 - Symptomatic but completely ambulatory  Blood pressure 127/70, pulse 69, temperature 98.2 F (36.8 C),  temperature source Temporal, resp. rate 18, height 5' (1.524 m), weight 174 lb 3.2 oz (79 kg), SpO2 100 %.  LABORATORY DATA: Lab Results  Component Value Date   WBC 5.6 08/16/2019   HGB 10.5 (L) 08/16/2019   HCT 34.0 (L) 08/16/2019   MCV 81.1 08/16/2019   PLT 260 08/16/2019      Chemistry      Component Value Date/Time   NA 143 08/16/2019 0906   K 4.0 08/16/2019 0906   CL 105 08/16/2019 0906   CO2 27 08/16/2019 0906   BUN 17 08/16/2019 0906   CREATININE 1.84 (H) 08/16/2019 0906      Component Value Date/Time   CALCIUM 9.6 08/16/2019 0906   ALKPHOS 74 08/16/2019 0906   AST 19 08/16/2019 0906   ALT 13 08/16/2019 0906   BILITOT 0.3 08/16/2019 0906       RADIOGRAPHIC STUDIES: CT Abdomen Pelvis Wo Contrast  Result Date: 09/04/2019 CLINICAL DATA:  Non-small-cell lung cancer.  Restaging. EXAM: CT CHEST, ABDOMEN AND PELVIS WITHOUT CONTRAST TECHNIQUE: Multidetector CT imaging of the chest, abdomen and pelvis was performed following the standard protocol without IV contrast. COMPARISON:  06/12/2019 FINDINGS: CT CHEST FINDINGS Cardiovascular: The heart size is normal. Stable small pericardial effusion. Coronary artery calcification is evident. Atherosclerotic calcification is noted in the wall of the thoracic aorta. Right Port-A-Cath tip is positioned in the SVC/RA junction. Mediastinum/Nodes: No mediastinal lymphadenopathy. No evidence for gross hilar lymphadenopathy although assessment is limited by the lack of intravenous contrast on today's study. The esophagus has normal imaging features. There is no axillary lymphadenopathy. Lungs/Pleura: 8 mm left upper lobe pulmonary nodule on 45/6 was 10 mm previously. Stable 7 mm right upper lobe nodule on 37/6. Subtle perifissural 3 mm nodule identified previously in the left lower lobe is stable on 58/6. Subtle 4 mm right lower lobe perifissural nodule identified previously is stable on 62/6. Other scattered tiny pulmonary nodules are similar. No  new suspicious pulmonary nodule or mass. No focal airspace consolidation. No pleural effusion. Musculoskeletal: No worrisome lytic or sclerotic osseous abnormality. CT ABDOMEN PELVIS FINDINGS Hepatobiliary: No focal abnormality in the liver on this study without intravenous contrast. There is no evidence for gallstones, gallbladder wall thickening, or pericholecystic fluid. No intrahepatic or extrahepatic biliary dilation. Pancreas: No focal mass lesion. No dilatation of the main duct. No intraparenchymal cyst. No peripancreatic edema. Spleen: No splenomegaly. No focal mass lesion. Adrenals/Urinary Tract: No adrenal nodule or mass. Unremarkable noncontrast appearance of the kidneys. No evidence for hydroureter. The urinary bladder appears normal  for the degree of distention. Stomach/Bowel: Stomach is unremarkable. No gastric wall thickening. No evidence of outlet obstruction. Duodenum is normally positioned as is the ligament of Treitz. No small bowel wall thickening. No small bowel dilatation. The terminal ileum is normal. The appendix is normal. No gross colonic mass. No colonic wall thickening. Vascular/Lymphatic: There is abdominal aortic atherosclerosis without aneurysm. There is no gastrohepatic or hepatoduodenal ligament lymphadenopathy. No retroperitoneal or mesenteric lymphadenopathy. No pelvic sidewall lymphadenopathy. Reproductive: The uterus is surgically absent. Other: No intraperitoneal free fluid. Musculoskeletal: No worrisome lytic or sclerotic osseous abnormality. IMPRESSION: 1. Stable exam. No new or progressive findings to suggest recurrent or metastatic disease in the chest, abdomen, or pelvis. 2. Scattered bilateral pulmonary nodules are stable in the interval. 3. Stable small pericardial effusion. 4. Aortic Atherosclerosis (ICD10-I70.0). Electronically Signed   By: Misty Stanley M.D.   On: 09/04/2019 08:09   CT Chest Wo Contrast  Result Date: 09/04/2019 CLINICAL DATA:  Non-small-cell lung  cancer.  Restaging. EXAM: CT CHEST, ABDOMEN AND PELVIS WITHOUT CONTRAST TECHNIQUE: Multidetector CT imaging of the chest, abdomen and pelvis was performed following the standard protocol without IV contrast. COMPARISON:  06/12/2019 FINDINGS: CT CHEST FINDINGS Cardiovascular: The heart size is normal. Stable small pericardial effusion. Coronary artery calcification is evident. Atherosclerotic calcification is noted in the wall of the thoracic aorta. Right Port-A-Cath tip is positioned in the SVC/RA junction. Mediastinum/Nodes: No mediastinal lymphadenopathy. No evidence for gross hilar lymphadenopathy although assessment is limited by the lack of intravenous contrast on today's study. The esophagus has normal imaging features. There is no axillary lymphadenopathy. Lungs/Pleura: 8 mm left upper lobe pulmonary nodule on 45/6 was 10 mm previously. Stable 7 mm right upper lobe nodule on 37/6. Subtle perifissural 3 mm nodule identified previously in the left lower lobe is stable on 58/6. Subtle 4 mm right lower lobe perifissural nodule identified previously is stable on 62/6. Other scattered tiny pulmonary nodules are similar. No new suspicious pulmonary nodule or mass. No focal airspace consolidation. No pleural effusion. Musculoskeletal: No worrisome lytic or sclerotic osseous abnormality. CT ABDOMEN PELVIS FINDINGS Hepatobiliary: No focal abnormality in the liver on this study without intravenous contrast. There is no evidence for gallstones, gallbladder wall thickening, or pericholecystic fluid. No intrahepatic or extrahepatic biliary dilation. Pancreas: No focal mass lesion. No dilatation of the main duct. No intraparenchymal cyst. No peripancreatic edema. Spleen: No splenomegaly. No focal mass lesion. Adrenals/Urinary Tract: No adrenal nodule or mass. Unremarkable noncontrast appearance of the kidneys. No evidence for hydroureter. The urinary bladder appears normal for the degree of distention. Stomach/Bowel:  Stomach is unremarkable. No gastric wall thickening. No evidence of outlet obstruction. Duodenum is normally positioned as is the ligament of Treitz. No small bowel wall thickening. No small bowel dilatation. The terminal ileum is normal. The appendix is normal. No gross colonic mass. No colonic wall thickening. Vascular/Lymphatic: There is abdominal aortic atherosclerosis without aneurysm. There is no gastrohepatic or hepatoduodenal ligament lymphadenopathy. No retroperitoneal or mesenteric lymphadenopathy. No pelvic sidewall lymphadenopathy. Reproductive: The uterus is surgically absent. Other: No intraperitoneal free fluid. Musculoskeletal: No worrisome lytic or sclerotic osseous abnormality. IMPRESSION: 1. Stable exam. No new or progressive findings to suggest recurrent or metastatic disease in the chest, abdomen, or pelvis. 2. Scattered bilateral pulmonary nodules are stable in the interval. 3. Stable small pericardial effusion. 4. Aortic Atherosclerosis (ICD10-I70.0). Electronically Signed   By: Misty Stanley M.D.   On: 09/04/2019 08:09    ASSESSMENT AND PLAN: This is a very  pleasant 64 years old African-American female with likely stage IV (T1c, N1, M1C) lung cancer pending tissue diagnosis and presented with left upper lobe lung nodule in addition to right upper lobe pulmonary nodule as well as left hilar adenopathy and metastatic lesion to the brain. Molecular studies showed no actionable mutation and PDL 1 expression was 1%. The patient underwent SBRT to the metastatic brain lesion under the care of Dr. Isidore Moos. The patient is currently undergoing systemic chemotherapy with carboplatin for AUC of 5, Alimta 500 mg/M2 and Keytruda 200 mg IV every 3 weeks is status post 16 cycles.  Starting from cycle #5 she is on maintenance treatment with Alimta and Keytruda every 3 weeks with only Keytruda on the last few cycles because of the renal insufficiency. The patient continues to tolerate her treatment with  [facility] fairly well. She had repeat CT scan of the chest, abdomen pelvis performed recently.  I personally and independently reviewed the scan and discussed the results with the patient today. Her scan showed no concerning findings for disease progression. I recommended for her to continue her current treatment with [facility] and she will proceed with cycle #17 today. The patient will come back for follow-up visit in 3 weeks for evaluation before the next cycle of her treatment. For the renal insufficiency she is followed by nephrology. She was advised to call immediately if she has any concerning symptoms in the interval. The patient voices understanding of current disease status and treatment options and is in agreement with the current care plan. All questions were answered. The patient knows to call the clinic with any problems, questions or concerns. We can certainly see the patient much sooner if necessary.  Disclaimer: This note was dictated with voice recognition software. Similar sounding words can inadvertently be transcribed and may not be corrected upon review.

## 2019-09-06 NOTE — Patient Instructions (Signed)

## 2019-09-06 NOTE — Progress Notes (Signed)
Per Dr Julien Nordmann, ok to treat with creatinine 1.9

## 2019-09-06 NOTE — Patient Instructions (Signed)
[facility] Discharge Instructions for Patients Receiving Chemotherapy  Today you received the following chemotherapy agents: pembrolizumab.  To help prevent nausea and vomiting after your treatment, we encourage you to take your nausea medication as directed.   If you develop nausea and vomiting that is not controlled by your nausea medication, call the clinic.   BELOW ARE SYMPTOMS THAT SHOULD BE REPORTED IMMEDIATELY:  *FEVER GREATER THAN 100.5 F  *CHILLS WITH OR WITHOUT FEVER  NAUSEA AND VOMITING THAT IS NOT CONTROLLED WITH YOUR NAUSEA MEDICATION  *UNUSUAL SHORTNESS OF BREATH  *UNUSUAL BRUISING OR BLEEDING  TENDERNESS IN MOUTH AND THROAT WITH OR WITHOUT PRESENCE OF ULCERS  *URINARY PROBLEMS  *BOWEL PROBLEMS  UNUSUAL RASH Items with * indicate a potential emergency and should be followed up as soon as possible.  Feel free to call the clinic should you have any questions or concerns. The clinic phone number is [phone].  Please show the CHEMO ALERT CARD at check-in to the Emergency Department and triage nurse.   

## 2019-09-25 NOTE — Progress Notes (Signed)
[place] OFFICE PROGRESS NOTE  [name], MD [address] [place] [address]  DIAGNOSIS: Stage IV(T1c, N1, M1 C)non-small cell lung cancer, adenocarcinoma. Shepresented with left upper lobe lung nodule in addition to right upper lobe lung nodule with left hilar lymphadenopathy as well as multiple brain metastasis.She was diagnosed in July of 2020  Biomarker Findings Microsatellite status - MS-Stable Tumor Mutational Burden - 4 Muts/Mb Genomic Findings For a complete list of the genes assayed, please refer to the Appendix. ATM G204* KRAS G12C PDGFRA P122T SMO R199W 7 Disease relevant genes with no reportable alterations: ALK, BRAF, EGFR, ERBB2, MET, RET, ROS1  PDL 1 expressionwas 1%  PRIOR THERAPY: SBRT to multiple brain metastasis under the care of Dr. Isidore Moos.Completed in August 2020.  CURRENT THERAPY: Systemic chemotherapy with Carboplatin for an AUC of 5, Alimta 500 mg/m2, and Keytruda 200 mg IV every 3 weeks. Status post17cyclesof treatment. First dose on 10/05/2018. She started maintenance Keytruda and Alimta starting from cycle #5. Alimta was discontinued due to renal insufficiency.   INTERVAL HISTORY: EDIN SKARDA 64 y.o. female returns to the clinic for a follow up visit. The patient is feeling well today without any concerning complaints. The patient continues to tolerate treatment with single agent Alimta well without any adverse side effects. Denies any fever, chills, night sweats, or weight loss. She inquired about the Keto diet. Denies any chest pain, shortness of breath, cough, or hemoptysis. Denies any nausea, vomiting, or diarrhea. She sometimes has mild constipation and is wondering if we can refill an old prescription for Linzess. She also wants to know if we would recommend the COVID booster. Denies any headache or visual changes. She has a follow up brain MRI next week due to her history of metastatic disease to the  brain. Denies any rashes or skin changes. The patient is here today for evaluation prior to starting cycle # 18.   MEDICAL HISTORY: Past Medical History:  Diagnosis Date  . Anginal pain ([place])    " with exertion "  . Arthritis    " 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 "  . Asthma    h/o  . Coronary artery disease   . Diabetes mellitus without complication ([place])    Type II  . GERD (gastroesophageal reflux disease)   . H/O hiatal hernia   . Heart murmur   . History of radiation therapy 09/22/2018   stereotactic radiosurgery   . 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 Hypertension   . nscl ca dx'd 08/07/18  . Persistent headaches    h/o  . Pneumonia    hx of PNA  . Shortness of breath   . Vaginal dryness    h/o    ALLERGIES:  is allergic to lisinopril.  MEDICATIONS:  Current Outpatient Medications  Medication Sig Dispense Refill  . acetaminophen (TYLENOL) 500 MG tablet Take 1,000 mg by mouth every 8 (eight) hours as needed for mild pain, fever or headache.     Marland Kitchen amLODipine (NORVASC) 5 MG tablet Take 1 tablet by mouth once daily 90 tablet 2  . aspirin EC 81 MG tablet Take 81 mg by mouth daily.    . carvedilol (COREG) 25 MG tablet Take 1/2 (one-half) tablet by mouth twice daily 180 tablet 0  . cetirizine (ZYRTEC) 10 MG tablet Take 10 mg by mouth daily.     . Cholecalciferol (VITAMIN D-3) 125 MCG (5000 UT) TABS Take 5,000 Units by mouth daily.    Marland Kitchen  Chromium Picolinate 1000 MCG TABS Take 1,000 mg by mouth daily.    Marland Kitchen lidocaine-prilocaine (EMLA) cream Apply to the Port-A-Cath site 30 to 60 minutes before chemotherapy. 30 g 2  . metFORMIN (GLUCOPHAGE) 500 MG tablet Take 500 mg by mouth 2 (two) times daily with a meal.     . methocarbamol (ROBAXIN) 500 MG tablet Take 1 tablet (500 mg total) by mouth every 6 (six) hours as needed for muscle spasms. 21 tablet 0  . nitroGLYCERIN (NITROSTAT) 0.4 MG SL tablet Place 1 tablet (0.4 mg total) under the tongue every 5 (five) minutes as needed for chest pain. 25 tablet 2  .  ONETOUCH ULTRA test strip USE 1 STRIP TO CHECK GLUCOSE THREE TIMES DAILY    . Pitavastatin Calcium (LIVALO) 4 MG TABS Take 4 mg by mouth at bedtime.     Vladimir Faster Glycol-Propyl Glycol (SYSTANE) 0.4-0.3 % SOLN Place 1 drop into both eyes every 6 (six) hours as needed (dry eyes).     . prochlorperazine (COMPAZINE) 10 MG tablet Take 1 tablet (10 mg total) by mouth every 6 (six) hours as needed for nausea or vomiting. 30 tablet 2  . psyllium (METAMUCIL) 58.6 % packet Take 1 packet by mouth at bedtime as needed (constipation).     Marland Kitchen telmisartan (MICARDIS) 40 MG tablet Take 40 mg by mouth daily.    . folic acid (FOLVITE) 1 MG tablet Take 1 tablet (1 mg total) by mouth daily. (Patient not taking: Reported on 09/27/2019) 30 tablet 2  . glimepiride (AMARYL) 1 MG tablet Take 0.5 mg by mouth daily as needed.  (Patient not taking: Reported on 09/27/2019)    . zolpidem (AMBIEN) 10 MG tablet Take 2.5 mg by mouth at bedtime.  (Patient not taking: Reported on 09/27/2019)     No current facility-administered medications for this visit.    SURGICAL HISTORY:  Past Surgical History:  Procedure Laterality Date  . ABDOMINAL HYSTERECTOMY    . bone spur    . CARDIAC CATHETERIZATION  06/13/2012  . carpel tunnel surgery Left   . COLONOSCOPY     9 years ago  . CORONARY ANGIOPLASTY  06/13/2012  . EYE SURGERY     cyst left eye   . IR IMAGING GUIDED PORT INSERTION  10/10/2018  . LEFT HEART CATHETERIZATION WITH CORONARY ANGIOGRAM N/A 06/13/2012   Procedure: LEFT HEART CATHETERIZATION WITH CORONARY ANGIOGRAM;  Surgeon: Laverda Page, MD;  Location: [facility] CATH LAB;  Service: Cardiovascular;  Laterality: N/A;  . NECK SURGERY    . rotator cuff surgery Right 2015  . VIDEO BRONCHOSCOPY WITH ENDOBRONCHIAL NAVIGATION N/A 09/06/2018   Procedure: VIDEO BRONCHOSCOPY WITH ENDOBRONCHIAL NAVIGATION, left upper lung;  Surgeon: Lajuana Matte, MD;  Location: [place];  Service: Thoracic;  Laterality: N/A;  . VIDEO BRONCHOSCOPY WITH  ENDOBRONCHIAL ULTRASOUND Left 09/06/2018   Procedure: VIDEO BRONCHOSCOPY WITH ENDOBRONCHIAL ULTRASOUND, left lung;  Surgeon: Lajuana Matte, MD;  Location: [place];  Service: Thoracic;  Laterality: Left;  . WISDOM TOOTH EXTRACTION      REVIEW OF SYSTEMS:   Review of Systems  Constitutional: Negative for appetite change, chills, fatigue, fever and unexpected weight change.  HENT: Negative for mouth sores, nosebleeds, sore throat and trouble swallowing.   Eyes: Negative for eye problems and icterus.  Respiratory: Negative for cough, hemoptysis, shortness of breath and wheezing.   Cardiovascular: Negative for chest pain and leg swelling.  Gastrointestinal: Positive for mild constipation. Negative for abdominal pain, diarrhea, nausea and vomiting.  Genitourinary: Negative for bladder incontinence, difficulty urinating, dysuria, frequency and hematuria.   Musculoskeletal: Negative for back pain, gait problem, neck pain and neck stiffness.  Skin: Negative for itching and rash.  Neurological: Negative for dizziness, extremity weakness, gait problem, headaches, light-headedness and seizures.  Hematological: Negative for adenopathy. Does not bruise/bleed easily.  Psychiatric/Behavioral: Negative for confusion, depression and sleep disturbance. The patient is not nervous/anxious.     PHYSICAL EXAMINATION:  Blood pressure (!) 149/64, pulse 60, temperature 98.5 F (36.9 C), temperature source Temporal, resp. rate 18, weight 175 lb 14.4 oz (79.8 kg), SpO2 100 %.  ECOG PERFORMANCE STATUS: 1 - Symptomatic but completely ambulatory  Physical Exam  Constitutional: Oriented to person, place, and time and well-developed, well-nourished, and in no distress.   HENT:  Head: Normocephalic and atraumatic.  Mouth/Throat: Oropharynx is clear and moist. No oropharyngeal exudate.  Eyes: Conjunctivae are normal. Right eye exhibits no discharge. Left eye exhibits no discharge. No scleral icterus.  Neck: Normal  range of motion. Neck supple.  Cardiovascular: Normal rate, regular rhythm, normal heart sounds and intact distal pulses.   Pulmonary/Chest: Effort normal and breath sounds normal. No respiratory distress. No wheezes. No rales.  Abdominal: Soft. Bowel sounds are normal. Exhibits no distension and no mass. There is no tenderness.  Musculoskeletal: Normal range of motion. Exhibits no edema.  Lymphadenopathy:    No cervical adenopathy.  Neurological: Alert and oriented to person, place, and time. Exhibits normal muscle tone. Gait normal. Coordination normal.  Skin: Skin is warm and dry. No rash noted. Not diaphoretic. No erythema. No pallor.  Psychiatric: Mood, memory and judgment normal.  Vitals reviewed.  LABORATORY DATA: Lab Results  Component Value Date   WBC 6.4 09/27/2019   HGB 10.2 (L) 09/27/2019   HCT 33.4 (L) 09/27/2019   MCV 80.3 09/27/2019   PLT 250 09/27/2019      Chemistry      Component Value Date/Time   NA 142 09/27/2019 0842   K 3.6 09/27/2019 0842   CL 105 09/27/2019 0842   CO2 26 09/27/2019 0842   BUN 20 09/27/2019 0842   CREATININE 1.79 (H) 09/27/2019 0842      Component Value Date/Time   CALCIUM 9.8 09/27/2019 0842   ALKPHOS 73 09/27/2019 0842   AST 19 09/27/2019 0842   ALT 9 09/27/2019 0842   BILITOT 0.3 09/27/2019 0842       RADIOGRAPHIC STUDIES:  CT Abdomen Pelvis Wo Contrast  Result Date: 09/04/2019 CLINICAL DATA:  Non-small-cell lung cancer.  Restaging. EXAM: CT CHEST, ABDOMEN AND PELVIS WITHOUT CONTRAST TECHNIQUE: Multidetector CT imaging of the chest, abdomen and pelvis was performed following the standard protocol without IV contrast. COMPARISON:  06/12/2019 FINDINGS: CT CHEST FINDINGS Cardiovascular: The heart size is normal. Stable small pericardial effusion. Coronary artery calcification is evident. Atherosclerotic calcification is noted in the wall of the thoracic aorta. Right Port-A-Cath tip is positioned in the SVC/RA junction.  Mediastinum/Nodes: No mediastinal lymphadenopathy. No evidence for gross hilar lymphadenopathy although assessment is limited by the lack of intravenous contrast on today's study. The esophagus has normal imaging features. There is no axillary lymphadenopathy. Lungs/Pleura: 8 mm left upper lobe pulmonary nodule on 45/6 was 10 mm previously. Stable 7 mm right upper lobe nodule on 37/6. Subtle perifissural 3 mm nodule identified previously in the left lower lobe is stable on 58/6. Subtle 4 mm right lower lobe perifissural nodule identified previously is stable on 62/6. Other scattered tiny pulmonary nodules are similar. No new suspicious  pulmonary nodule or mass. No focal airspace consolidation. No pleural effusion. Musculoskeletal: No worrisome lytic or sclerotic osseous abnormality. CT ABDOMEN PELVIS FINDINGS Hepatobiliary: No focal abnormality in the liver on this study without intravenous contrast. There is no evidence for gallstones, gallbladder wall thickening, or pericholecystic fluid. No intrahepatic or extrahepatic biliary dilation. Pancreas: No focal mass lesion. No dilatation of the main duct. No intraparenchymal cyst. No peripancreatic edema. Spleen: No splenomegaly. No focal mass lesion. Adrenals/Urinary Tract: No adrenal nodule or mass. Unremarkable noncontrast appearance of the kidneys. No evidence for hydroureter. The urinary bladder appears normal for the degree of distention. Stomach/Bowel: Stomach is unremarkable. No gastric wall thickening. No evidence of outlet obstruction. Duodenum is normally positioned as is the ligament of Treitz. No small bowel wall thickening. No small bowel dilatation. The terminal ileum is normal. The appendix is normal. No gross colonic mass. No colonic wall thickening. Vascular/Lymphatic: There is abdominal aortic atherosclerosis without aneurysm. There is no gastrohepatic or hepatoduodenal ligament lymphadenopathy. No retroperitoneal or mesenteric lymphadenopathy. No  pelvic sidewall lymphadenopathy. Reproductive: The uterus is surgically absent. Other: No intraperitoneal free fluid. Musculoskeletal: No worrisome lytic or sclerotic osseous abnormality. IMPRESSION: 1. Stable exam. No new or progressive findings to suggest recurrent or metastatic disease in the chest, abdomen, or pelvis. 2. Scattered bilateral pulmonary nodules are stable in the interval. 3. Stable small pericardial effusion. 4. Aortic Atherosclerosis (ICD10-I70.0). Electronically Signed   By: Misty Stanley M.D.   On: 09/04/2019 08:09   CT Chest Wo Contrast  Result Date: 09/04/2019 CLINICAL DATA:  Non-small-cell lung cancer.  Restaging. EXAM: CT CHEST, ABDOMEN AND PELVIS WITHOUT CONTRAST TECHNIQUE: Multidetector CT imaging of the chest, abdomen and pelvis was performed following the standard protocol without IV contrast. COMPARISON:  06/12/2019 FINDINGS: CT CHEST FINDINGS Cardiovascular: The heart size is normal. Stable small pericardial effusion. Coronary artery calcification is evident. Atherosclerotic calcification is noted in the wall of the thoracic aorta. Right Port-A-Cath tip is positioned in the SVC/RA junction. Mediastinum/Nodes: No mediastinal lymphadenopathy. No evidence for gross hilar lymphadenopathy although assessment is limited by the lack of intravenous contrast on today's study. The esophagus has normal imaging features. There is no axillary lymphadenopathy. Lungs/Pleura: 8 mm left upper lobe pulmonary nodule on 45/6 was 10 mm previously. Stable 7 mm right upper lobe nodule on 37/6. Subtle perifissural 3 mm nodule identified previously in the left lower lobe is stable on 58/6. Subtle 4 mm right lower lobe perifissural nodule identified previously is stable on 62/6. Other scattered tiny pulmonary nodules are similar. No new suspicious pulmonary nodule or mass. No focal airspace consolidation. No pleural effusion. Musculoskeletal: No worrisome lytic or sclerotic osseous abnormality. CT ABDOMEN  PELVIS FINDINGS Hepatobiliary: No focal abnormality in the liver on this study without intravenous contrast. There is no evidence for gallstones, gallbladder wall thickening, or pericholecystic fluid. No intrahepatic or extrahepatic biliary dilation. Pancreas: No focal mass lesion. No dilatation of the main duct. No intraparenchymal cyst. No peripancreatic edema. Spleen: No splenomegaly. No focal mass lesion. Adrenals/Urinary Tract: No adrenal nodule or mass. Unremarkable noncontrast appearance of the kidneys. No evidence for hydroureter. The urinary bladder appears normal for the degree of distention. Stomach/Bowel: Stomach is unremarkable. No gastric wall thickening. No evidence of outlet obstruction. Duodenum is normally positioned as is the ligament of Treitz. No small bowel wall thickening. No small bowel dilatation. The terminal ileum is normal. The appendix is normal. No gross colonic mass. No colonic wall thickening. Vascular/Lymphatic: There is abdominal aortic atherosclerosis without aneurysm.  There is no gastrohepatic or hepatoduodenal ligament lymphadenopathy. No retroperitoneal or mesenteric lymphadenopathy. No pelvic sidewall lymphadenopathy. Reproductive: The uterus is surgically absent. Other: No intraperitoneal free fluid. Musculoskeletal: No worrisome lytic or sclerotic osseous abnormality. IMPRESSION: 1. Stable exam. No new or progressive findings to suggest recurrent or metastatic disease in the chest, abdomen, or pelvis. 2. Scattered bilateral pulmonary nodules are stable in the interval. 3. Stable small pericardial effusion. 4. Aortic Atherosclerosis (ICD10-I70.0). Electronically Signed   By: Misty Stanley M.D.   On: 09/04/2019 08:09     ASSESSMENT/PLAN:  This is a very pleasant 64 year old African-American female with stage IV (T1c, N1,M1c) non-small celllung cancer, adenocarcinoma. She presented with a dominant left upper lobe lung nodule in addition to a right upper lobe pulmonary  nodule with left hilar lymphadenopathy. She also has metastatic disease to the brain.She has no actionable mutations and her PDL1 expression is 1%. She was diagnosed in July 2020.   The patient completed SBRT to the metastatic brain lesion under the care of Dr. Isidore Moos in August 2020.   The patient is currently undergoing systemic chemotherapy withcarboplatin for an AUC of 5, Alimta 500 mg/m, and Keytruda 200 mg IV every 3 weeks. She is status post17cyclesof treatment. Starting from cycle #5, she has been on maintenance Alimta 500 mg/m2 and Keytruda 200 mg IV.She tolerated her treatment well without anyconcerningadverse side effects.Alimta was discontinued due to renal insufficiency.   Labs were reviewed. Recommend that she proceed with cycle #18 today as scheduled.   We will see her back for a follow up visit in 3 weeks for evaluation before starting cycle #19.   I will place a referral to the dietician. Discussed with Dr. Julien Nordmann who would not recommend the Keto diet for someone on active treatment. I will place a referral to the nutritionist o see the patient to discuss appropriate ways to have a healthy diet. The patient also has a walking buddy and is going to start walking again.   I will refill her EMLA cream.   Recommend using stool softeners and laxatives if needed for constipation. Additionally, would recommend fruits/veggies, drinking water, and being active to reduce the risk of constipation.   Discussed we would recommend the COVID booster vaccine if she is able to have this performed.   The patient was advised to call immediately if she has any concerning symptoms in the interval. The patient voices understanding of current disease status and treatment options and is in agreement with the current care plan. All questions were answered. The patient knows to call the clinic with any problems, questions or concerns. We can certainly see the patient much sooner if  necessary   Orders Placed This Encounter  Procedures  . TSH    Standing Status:   Standing    Number of Occurrences:   18    Standing Expiration Date:   09/26/2020  . Ambulatory referral to Nutrition and Diabetic E    Referral Priority:   Routine    Referral Type:   Consultation    Referral Reason:   Specialty Services Required    Number of Visits Requested:   [place], PA-C 09/27/19

## 2019-09-27 ENCOUNTER — Inpatient Hospital Stay: Payer: Medicare Other

## 2019-09-27 ENCOUNTER — Other Ambulatory Visit: Payer: Self-pay

## 2019-09-27 ENCOUNTER — Encounter: Payer: Self-pay | Admitting: Physician Assistant

## 2019-09-27 ENCOUNTER — Inpatient Hospital Stay: Payer: Medicare Other | Attending: Physician Assistant | Admitting: Physician Assistant

## 2019-09-27 VITALS — BP 149/64 | HR 60 | Temp 98.5°F | Resp 18 | Wt 175.9 lb

## 2019-09-27 DIAGNOSIS — C3492 Malignant neoplasm of unspecified part of left bronchus or lung: Secondary | ICD-10-CM

## 2019-09-27 DIAGNOSIS — N289 Disorder of kidney and ureter, unspecified: Secondary | ICD-10-CM

## 2019-09-27 DIAGNOSIS — R5383 Other fatigue: Secondary | ICD-10-CM | POA: Diagnosis not present

## 2019-09-27 DIAGNOSIS — C7931 Secondary malignant neoplasm of brain: Secondary | ICD-10-CM | POA: Insufficient documentation

## 2019-09-27 DIAGNOSIS — Z95828 Presence of other vascular implants and grafts: Secondary | ICD-10-CM

## 2019-09-27 DIAGNOSIS — Z79899 Other long term (current) drug therapy: Secondary | ICD-10-CM | POA: Diagnosis not present

## 2019-09-27 DIAGNOSIS — Z90710 Acquired absence of both cervix and uterus: Secondary | ICD-10-CM

## 2019-09-27 DIAGNOSIS — C3412 Malignant neoplasm of upper lobe, left bronchus or lung: Secondary | ICD-10-CM | POA: Diagnosis not present

## 2019-09-27 DIAGNOSIS — Z5112 Encounter for antineoplastic immunotherapy: Secondary | ICD-10-CM | POA: Diagnosis not present

## 2019-09-27 DIAGNOSIS — K5900 Constipation, unspecified: Secondary | ICD-10-CM | POA: Insufficient documentation

## 2019-09-27 LAB — CMP (CANCER CENTER ONLY)
ALT: 9 U/L (ref 0–44)
AST: 19 U/L (ref 15–41)
Albumin: 3.4 g/dL — ABNORMAL LOW (ref 3.5–5.0)
Alkaline Phosphatase: 73 U/L (ref 38–126)
Anion gap: 11 (ref 5–15)
BUN: 20 mg/dL (ref 8–23)
CO2: 26 mmol/L (ref 22–32)
Calcium: 9.8 mg/dL (ref 8.9–10.3)
Chloride: 105 mmol/L (ref 98–111)
Creatinine: 1.79 mg/dL — ABNORMAL HIGH (ref 0.44–1.00)
GFR, Est AFR Am: 34 mL/min — ABNORMAL LOW (ref >60)
GFR, Estimated: 29 mL/min — ABNORMAL LOW (ref >60)
Glucose, Bld: 123 mg/dL — ABNORMAL HIGH (ref 70–99)
Potassium: 3.6 mmol/L (ref 3.5–5.1)
Sodium: 142 mmol/L (ref 135–145)
Total Bilirubin: 0.3 mg/dL (ref 0.3–1.2)
Total Protein: 7 g/dL (ref 6.5–8.1)

## 2019-09-27 LAB — CBC WITH DIFFERENTIAL (CANCER CENTER ONLY)
Abs Immature Granulocytes: 0.01 10*3/uL (ref 0.00–0.07)
Basophils Absolute: 0 10*3/uL (ref 0.0–0.1)
Basophils Relative: 0 %
Eosinophils Absolute: 0.3 10*3/uL (ref 0.0–0.5)
Eosinophils Relative: 5 %
HCT: 33.4 % — ABNORMAL LOW (ref 36.0–46.0)
Hemoglobin: 10.2 g/dL — ABNORMAL LOW (ref 12.0–15.0)
Immature Granulocytes: 0 %
Lymphocytes Relative: 24 %
Lymphs Abs: 1.5 10*3/uL (ref 0.7–4.0)
MCH: 24.5 pg — ABNORMAL LOW (ref 26.0–34.0)
MCHC: 30.5 g/dL (ref 30.0–36.0)
MCV: 80.3 fL (ref 80.0–100.0)
Monocytes Absolute: 0.7 10*3/uL (ref 0.1–1.0)
Monocytes Relative: 11 %
Neutro Abs: 3.8 10*3/uL (ref 1.7–7.7)
Neutrophils Relative %: 60 %
Platelet Count: 250 10*3/uL (ref 150–400)
RBC: 4.16 MIL/uL (ref 3.87–5.11)
RDW: 16 % — ABNORMAL HIGH (ref 11.5–15.5)
WBC Count: 6.4 10*3/uL (ref 4.0–10.5)
nRBC: 0 % (ref 0.0–0.2)

## 2019-09-27 MED ORDER — SODIUM CHLORIDE 0.9 % IV SOLN
Freq: Once | INTRAVENOUS | Status: AC
  Filled 2019-09-27: qty 250

## 2019-09-27 MED ORDER — LIDOCAINE-PRILOCAINE 2.5-2.5 % EX CREA: TOPICAL_CREAM | CUTANEOUS | 2 refills | Status: DC

## 2019-09-27 MED ORDER — HEPARIN SOD (PORK) LOCK FLUSH 100 UNIT/ML IV SOLN
500.0000 [IU] | Freq: Once | INTRAVENOUS | Status: AC | PRN
  Administered 2019-09-27: 500 [IU]
  Filled 2019-09-27: qty 5

## 2019-09-27 MED ORDER — SODIUM CHLORIDE 0.9 % IV SOLN
200.0000 mg | Freq: Once | INTRAVENOUS | Status: AC
  Administered 2019-09-27: 200 mg via INTRAVENOUS
  Filled 2019-09-27: qty 8

## 2019-09-27 MED ORDER — SODIUM CHLORIDE 0.9% FLUSH
10.0000 mL | INTRAVENOUS | Status: DC | PRN
  Administered 2019-09-27: 10 mL
  Filled 2019-09-27: qty 10

## 2019-09-27 NOTE — Patient Instructions (Signed)
[facility] Discharge Instructions for Patients Receiving Chemotherapy  Today you received the following chemotherapy agents: pembrolizumab.  To help prevent nausea and vomiting after your treatment, we encourage you to take your nausea medication as directed.   If you develop nausea and vomiting that is not controlled by your nausea medication, call the clinic.   BELOW ARE SYMPTOMS THAT SHOULD BE REPORTED IMMEDIATELY:  *FEVER GREATER THAN 100.5 F  *CHILLS WITH OR WITHOUT FEVER  NAUSEA AND VOMITING THAT IS NOT CONTROLLED WITH YOUR NAUSEA MEDICATION  *UNUSUAL SHORTNESS OF BREATH  *UNUSUAL BRUISING OR BLEEDING  TENDERNESS IN MOUTH AND THROAT WITH OR WITHOUT PRESENCE OF ULCERS  *URINARY PROBLEMS  *BOWEL PROBLEMS  UNUSUAL RASH Items with * indicate a potential emergency and should be followed up as soon as possible.  Feel free to call the clinic should you have any questions or concerns. The clinic phone number is [phone].  Please show the CHEMO ALERT CARD at check-in to the Emergency Department and triage nurse.   

## 2019-09-27 NOTE — Patient Instructions (Signed)

## 2019-09-27 NOTE — Progress Notes (Signed)
Scr,1.79, okay to treat per [employer], [employer]

## 2019-10-03 ENCOUNTER — Telehealth: Payer: Self-pay | Admitting: Internal Medicine

## 2019-10-03 NOTE — Telephone Encounter (Signed)
Scheduled apt per 8/24 sch msg - left message for patient with appt date and time .

## 2019-10-05 ENCOUNTER — Other Ambulatory Visit: Payer: Self-pay | Admitting: Radiation Therapy

## 2019-10-05 ENCOUNTER — Other Ambulatory Visit: Payer: Self-pay

## 2019-10-05 ENCOUNTER — Ambulatory Visit
Admission: RE | Admit: 2019-10-05 | Discharge: 2019-10-05 | Disposition: A | Discharge status: Home / Self Care | Payer: Medicare Other | Source: Ambulatory Visit | Attending: Internal Medicine | Admitting: Internal Medicine

## 2019-10-05 DIAGNOSIS — C7949 Secondary malignant neoplasm of other parts of nervous system: Secondary | ICD-10-CM

## 2019-10-05 DIAGNOSIS — C3490 Malignant neoplasm of unspecified part of unspecified bronchus or lung: Secondary | ICD-10-CM | POA: Diagnosis not present

## 2019-10-05 DIAGNOSIS — C7931 Secondary malignant neoplasm of brain: Secondary | ICD-10-CM

## 2019-10-05 MED ORDER — HEPARIN SOD (PORK) LOCK FLUSH 100 UNIT/ML IV SOLN
500.0000 [IU] | Freq: Once | INTRAVENOUS | Status: AC
  Administered 2019-10-05: 500 [IU] via INTRAVENOUS

## 2019-10-05 MED ORDER — GADOBENATE DIMEGLUMINE 529 MG/ML IV SOLN
16.0000 mL | Freq: Once | INTRAVENOUS | Status: AC | PRN
  Administered 2019-10-05: 16 mL via INTRAVENOUS

## 2019-10-05 MED ORDER — SODIUM CHLORIDE 0.9% FLUSH: 10.0000 mL | INTRAVENOUS | Status: DC | PRN

## 2019-10-05 NOTE — Progress Notes (Signed)
Orders entered to access port at GI the day of brain MRI.  [name]

## 2019-10-08 ENCOUNTER — Inpatient Hospital Stay: Payer: Medicare Other

## 2019-10-09 ENCOUNTER — Other Ambulatory Visit: Payer: Self-pay

## 2019-10-09 ENCOUNTER — Inpatient Hospital Stay (HOSPITAL_BASED_OUTPATIENT_CLINIC_OR_DEPARTMENT_OTHER): Payer: Medicare Other | Admitting: Internal Medicine

## 2019-10-09 VITALS — BP 140/65 | HR 69 | Temp 98.2°F | Resp 18 | Ht 60.0 in | Wt 173.6 lb

## 2019-10-09 DIAGNOSIS — Z5112 Encounter for antineoplastic immunotherapy: Secondary | ICD-10-CM | POA: Diagnosis not present

## 2019-10-09 DIAGNOSIS — C7931 Secondary malignant neoplasm of brain: Secondary | ICD-10-CM

## 2019-10-09 DIAGNOSIS — Z79899 Other long term (current) drug therapy: Secondary | ICD-10-CM | POA: Diagnosis not present

## 2019-10-09 DIAGNOSIS — K5900 Constipation, unspecified: Secondary | ICD-10-CM | POA: Diagnosis not present

## 2019-10-09 DIAGNOSIS — C3412 Malignant neoplasm of upper lobe, left bronchus or lung: Secondary | ICD-10-CM | POA: Diagnosis not present

## 2019-10-09 NOTE — Progress Notes (Signed)
[name], [place] [address] [phone]   Interval Evaluation  Date of Service: 10/09/19 Patient Name: Carolyn [name] Patient MRN: [id number] Patient DOB: 1955/06/15 Provider: Ventura Sellers, MD  Identifying Statement:  Carolyn [name] is a 64 y.o. female with Brain metastases ([place]) [C79.31]   Primary Cancer:  Oncologic History: Oncology History  Adenocarcinoma, lung ([place])  09/12/2018 Initial Diagnosis   Adenocarcinoma, lung ([place])   10/05/2018 -  Chemotherapy   The patient had palonosetron (ALOXI) injection 0.25 mg, 0.25 mg, Intravenous,  Once, 4 of 4 cycles Administration: 0.25 mg (10/05/2018), 0.25 mg (11/16/2018), 0.25 mg (12/07/2018), 0.25 mg (10/26/2018) PEMEtrexed (ALIMTA) 900 mg in sodium chloride 0.9 % 100 mL chemo infusion, 485 mg/m2 = 925 mg, Intravenous,  Once, 8 of 8 cycles Administration: 900 mg (10/05/2018), 900 mg (11/16/2018), 900 mg (12/07/2018), 900 mg (12/28/2018), 900 mg (01/18/2019), 900 mg (10/26/2018), 900 mg (02/08/2019), 900 mg (03/01/2019) CARBOplatin (PARAPLATIN) 440 mg in sodium chloride 0.9 % 250 mL chemo infusion, 440 mg (100 % of original dose 442.5 mg), Intravenous,  Once, 4 of 4 cycles Dose modification: 442.5 mg (original dose 442.5 mg, Cycle 1), 480.5 mg (original dose 480.5 mg, Cycle 3), 467 mg (original dose 467 mg, Cycle 4), 484 mg (original dose 484 mg, Cycle 2) Administration: 440 mg (10/05/2018), 480 mg (11/16/2018), 470 mg (12/07/2018), 480 mg (10/26/2018) pembrolizumab (KEYTRUDA) 200 mg in sodium chloride 0.9 % 50 mL chemo infusion, 200 mg, Intravenous, Once, 18 of 23 cycles Administration: 200 mg (10/05/2018), 200 mg (11/16/2018), 200 mg (12/07/2018), 200 mg (12/28/2018), 200 mg (01/18/2019), 200 mg (10/26/2018), 200 mg (02/08/2019), 200 mg (03/01/2019), 200 mg (03/22/2019), 200 mg (04/12/2019), 200 mg (05/03/2019), 200 mg (05/24/2019), 200 mg (06/14/2019), 200 mg (07/04/2019), 200 mg  (07/26/2019), 200 mg (08/16/2019), 200 mg (09/06/2019), 200 mg (09/27/2019) fosaprepitant (EMEND) 150 mg, dexamethasone (DECADRON) 12 mg in sodium chloride 0.9 % 145 mL IVPB, , Intravenous,  Once, 4 of 4 cycles Administration:  (10/05/2018),  (11/16/2018),  (12/07/2018),  (10/26/2018)  for chemotherapy treatment.     CNS Oncologic History: 09/22/18: Completes SRS to 9 sub-cm metastases Isidore Moos)  Interval History: The patient's records from the referring physician were obtained and reviewed and the patient interviewed to confirm this HPI.  Carolyn [name] presents today for follow up after recent MRI brain.  No new or progressive neurologic deficits.  No migraine headaches but occasional tension type, typically self limited. Continues with Keytruda through Dr. Julien Nordmann.  Medications: Current Outpatient Medications on File Prior to Visit  Medication Sig Dispense Refill  . acetaminophen (TYLENOL) 500 MG tablet Take 1,000 mg by mouth every 8 (eight) hours as needed for mild pain, fever or headache.     Marland Kitchen amLODipine (NORVASC) 5 MG tablet Take 1 tablet by mouth once daily 90 tablet 2  . aspirin EC 81 MG tablet Take 81 mg by mouth daily.    . carvedilol (COREG) 25 MG tablet Take 1/2 (one-half) tablet by mouth twice daily 180 tablet 0  . cetirizine (ZYRTEC) 10 MG tablet Take 10 mg by mouth daily.     . Cholecalciferol (VITAMIN D-3) 125 MCG (5000 UT) TABS Take 5,000 Units by mouth daily.    . Chromium Picolinate 1000 MCG TABS Take 1,000 mg by mouth daily.    . folic acid (FOLVITE) 1 MG tablet Take 1 tablet (1 mg total) by mouth daily. (Patient not taking: Reported on 09/27/2019) 30 tablet  2  . glimepiride (AMARYL) 1 MG tablet Take 0.5 mg by mouth daily as needed.  (Patient not taking: Reported on 09/27/2019)    . lidocaine-prilocaine (EMLA) cream Apply to the Port-A-Cath site 30 to 60 minutes before chemotherapy. 30 g 2  . metFORMIN (GLUCOPHAGE) 500 MG tablet Take 500 mg by mouth 2 (two) times daily with a  meal.     . methocarbamol (ROBAXIN) 500 MG tablet Take 1 tablet (500 mg total) by mouth every 6 (six) hours as needed for muscle spasms. 21 tablet 0  . nitroGLYCERIN (NITROSTAT) 0.4 MG SL tablet Place 1 tablet (0.4 mg total) under the tongue every 5 (five) minutes as needed for chest pain. 25 tablet 2  . ONETOUCH ULTRA test strip USE 1 STRIP TO CHECK GLUCOSE THREE TIMES DAILY    . Pitavastatin Calcium (LIVALO) 4 MG TABS Take 4 mg by mouth at bedtime.     Vladimir Faster Glycol-Propyl Glycol (SYSTANE) 0.4-0.3 % SOLN Place 1 drop into both eyes every 6 (six) hours as needed (dry eyes).     . prochlorperazine (COMPAZINE) 10 MG tablet Take 1 tablet (10 mg total) by mouth every 6 (six) hours as needed for nausea or vomiting. 30 tablet 2  . psyllium (METAMUCIL) 58.6 % packet Take 1 packet by mouth at bedtime as needed (constipation).     Marland Kitchen telmisartan (MICARDIS) 40 MG tablet Take 40 mg by mouth daily.    Marland Kitchen zolpidem (AMBIEN) 10 MG tablet Take 2.5 mg by mouth at bedtime.  (Patient not taking: Reported on 09/27/2019)     No current facility-administered medications on file prior to visit.    Allergies:  Allergies  Allergen Reactions  . Lisinopril Swelling    Lips and face swelled up.   Past Medical History:  Past Medical History:  Diagnosis Date  . Anginal pain ([place])    " with exertion "  . Arthritis    " 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 "  . Asthma    h/o  . Coronary artery disease   . Diabetes mellitus without complication ([place])    Type II  . GERD (gastroesophageal reflux disease)   . H/O hiatal hernia   . Heart murmur   . History of radiation therapy 09/22/2018   stereotactic radiosurgery   . 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 Hypertension   . nscl ca dx'd 08/07/18  . Persistent headaches    h/o  . Pneumonia    hx of PNA  . Shortness of breath   . Vaginal dryness    h/o   Past Surgical History:  Past Surgical History:  Procedure Laterality Date  . ABDOMINAL HYSTERECTOMY    . bone spur    . CARDIAC  CATHETERIZATION  06/13/2012  . carpel tunnel surgery Left   . COLONOSCOPY     9 years ago  . CORONARY ANGIOPLASTY  06/13/2012  . EYE SURGERY     cyst left eye   . IR IMAGING GUIDED PORT INSERTION  10/10/2018  . LEFT HEART CATHETERIZATION WITH CORONARY ANGIOGRAM N/A 06/13/2012   Procedure: LEFT HEART CATHETERIZATION WITH CORONARY ANGIOGRAM;  Surgeon: Laverda Page, MD;  Location: [facility] CATH LAB;  Service: Cardiovascular;  Laterality: N/A;  . NECK SURGERY    . rotator cuff surgery Right 2015  . VIDEO BRONCHOSCOPY WITH ENDOBRONCHIAL NAVIGATION N/A 09/06/2018   Procedure: VIDEO BRONCHOSCOPY WITH ENDOBRONCHIAL NAVIGATION, left upper lung;  Surgeon: Lajuana Matte, MD;  Location: [facility];  Service: Thoracic;  Laterality: N/A;  . VIDEO BRONCHOSCOPY WITH ENDOBRONCHIAL ULTRASOUND Left 09/06/2018   Procedure: VIDEO BRONCHOSCOPY WITH ENDOBRONCHIAL ULTRASOUND, left lung;  Surgeon: Lajuana Matte, MD;  Location: [facility];  Service: Thoracic;  Laterality: Left;  . WISDOM TOOTH EXTRACTION     Social History:  Social History   Socioeconomic History  . Marital status: Married    Spouse name: Not on file  . Number of children: 3  . Years of education: Not on file  . Highest education level: Not on file  Occupational History  . Not on file  Tobacco Use  . Smoking status: Former Smoker    Packs/day: 0.50    Years: 30.00    Pack years: 15.00    Quit date: 02/09/1999    Years since quitting: 20.6  . Smokeless tobacco: Never Used  Vaping Use  . Vaping Use: Never used  Substance and Sexual Activity  . Alcohol use: No  . Drug use: No  . Sexual activity: Yes    Birth control/protection: Post-menopausal  Other Topics Concern  . Not on file  Social History Narrative  . Not on file   Social Determinants of Health   Financial Resource Strain:   . Difficulty of Paying Living Expenses: Not on file  Food Insecurity:   . Worried About Charity fundraiser in the Last Year: Not on file  . Ran Out of  Food in the Last Year: Not on file  Transportation Needs:   . Lack of Transportation (Medical): Not on file  . Lack of Transportation (Non-Medical): Not on file  Physical Activity:   . Days of Exercise per Week: Not on file  . Minutes of Exercise per Session: Not on file  Stress:   . Feeling of Stress : Not on file  Social Connections:   . Frequency of Communication with Friends and Family: Not on file  . Frequency of Social Gatherings with Friends and Family: Not on file  . Attends Religious Services: Not on file  . Active Member of Clubs or Organizations: Not on file  . Attends Archivist Meetings: Not on file  . Marital Status: Not on file  Intimate Partner Violence:   . Fear of Current or Ex-Partner: Not on file  . Emotionally Abused: Not on file  . Physically Abused: Not on file  . Sexually Abused: Not on file   Family History:  Family History  Problem Relation Age of Onset  . Breast cancer Other   . Diabetes Mother   . Glaucoma Mother   . Hypertension Mother   . Hypertension Father   . Asthma Father   . Diabetes Sister   . Diabetes Brother   . Hypertension Sister     Review of Systems: Constitutional: Doesn't report fevers, chills or abnormal weight loss Eyes: Doesn't report blurriness of vision Ears, nose, mouth, throat, and face: Doesn't report sore throat Respiratory: Doesn't report cough, dyspnea or wheezes Cardiovascular: Doesn't report palpitation, chest discomfort  Gastrointestinal:  Doesn't report nausea, constipation, diarrhea GU: Doesn't report incontinence Skin: Doesn't report skin rashes Neurological: Per HPI Musculoskeletal: Doesn't report joint pain Behavioral/Psych: Doesn't report anxiety  Physical Exam: Vitals:   10/09/19 1200  BP: 140/65  Pulse: 69  Resp: 18  Temp: 98.2 F (36.8 C)  SpO2: 100%   KPS: 90. General: Alert, cooperative, pleasant, in no acute distress Head: Normal EENT: No conjunctival injection or scleral  icterus.  Lungs: Resp effort normal Cardiac: Regular rate Abdomen: Non-distended abdomen  Skin: No rashes cyanosis or petechiae. Extremities: No clubbing or edema  Neurologic Exam: Mental Status: Awake, alert, attentive to examiner. Oriented to self and environment. Language is fluent with intact comprehension.  Cranial Nerves: Visual acuity is grossly normal. Visual fields are full. Extra-ocular movements intact. No ptosis. Face is symmetric Motor: Tone and bulk are normal. Power is full in both arms and legs. Reflexes are symmetric, no pathologic reflexes present.  Sensory: Intact to light touch Gait: Normal.   Labs: I have reviewed the data as listed    Component Value Date/Time   NA 142 09/27/2019 0842   K 3.6 09/27/2019 0842   CL 105 09/27/2019 0842   CO2 26 09/27/2019 0842   GLUCOSE 123 (H) 09/27/2019 0842   BUN 20 09/27/2019 0842   CREATININE 1.79 (H) 09/27/2019 0842   CALCIUM 9.8 09/27/2019 0842   PROT 7.0 09/27/2019 0842   ALBUMIN 3.4 (L) 09/27/2019 0842   AST 19 09/27/2019 0842   ALT 9 09/27/2019 0842   ALKPHOS 73 09/27/2019 0842   BILITOT 0.3 09/27/2019 0842   GFRNONAA 29 (L) 09/27/2019 0842   GFRAA 34 (L) 09/27/2019 0842   Lab Results  Component Value Date   WBC 6.4 09/27/2019   NEUTROABS 3.8 09/27/2019   HGB 10.2 (L) 09/27/2019   HCT 33.4 (L) 09/27/2019   MCV 80.3 09/27/2019   PLT 250 09/27/2019    Imaging:  MR BRAIN W WO CONTRAST  Addendum Date: 10/05/2019   ADDENDUM REPORT: 10/05/2019 16:30 ADDENDUM: Contrast was administered via indwelling port accessed at [facility] at [address] Electronically Signed   By: Macy Mis M.D.   On: 10/05/2019 16:30   Result Date: 10/05/2019 CLINICAL DATA:  Metastatic lung cancer, prior radiation, follow-up EXAM: MRI HEAD WITHOUT AND WITH CONTRAST TECHNIQUE: Multiplanar, multiecho pulse sequences of the brain and surrounding structures were obtained without and with intravenous contrast. CONTRAST:   57mL MULTIHANCE GADOBENATE DIMEGLUMINE 529 MG/ML IV SOLN COMPARISON:  07/02/2019 FINDINGS: Brain: There is no acute infarction or intracranial hemorrhage. There is no intracranial mass, mass effect, or edema. There is no hydrocephalus or extra-axial fluid collection. Few small foci of T2 hyperintensity in the supratentorial white matter, which may reflect minor chronic microvascular ischemic changes. Unchanged bilateral inferior cerebellar encephalomalacia. Ventricles and sulci are normal in size and configuration. No abnormal enhancement. Vascular: Major vessel flow voids at the skull base are preserved. Skull and upper cervical spine: Normal marrow signal is preserved. Postsurgical changes of anterior fusion extending inferiorly from C3. Sinuses/Orbits: Minor mucosal thickening.  Orbits are unremarkable. Other: Sella is unremarkable.  Mastoid air cells are clear. IMPRESSION: No evidence of intracranial metastatic disease. Electronically Signed: By: Macy Mis M.D. On: 10/05/2019 10:06    [facility] Clinician Interpretation: I have personally reviewed the radiological images as listed.  My interpretation, in the context of the patient's clinical presentation, is stable disease   Assessment/Plan Brain metastases ([place]) [C79.31]  Joanne Chars Barthel is clinicallly and radiographically stable today.  MRI does not demonstrate any metastatic burden, nor radiation leukoencephalopathy.  We ask that Carolyn [name] return to clinic in 4 months following next brain MRI, or sooner as needed.  We spent twenty additional minutes teaching regarding the natural history, biology, and historical experience in the treatment of neurologic complications of cancer.   We appreciate the opportunity to participate in the care of Carolyn [name].   All questions were answered. The patient knows to call the clinic with any  problems, questions or concerns. No barriers to learning were detected.  I have spent a total of 30  minutes of face-to-face and non-face-to-face time, excluding clinical staff time, preparing to see patient, ordering tests and/or medications, counseling the patient, and independently interpreting results and communicating results to the patient/family/caregiver    Ventura Sellers, MD Medical Director of Neuro-Oncology [facility] at [place] 10/09/19 11:56 AM

## 2019-10-10 ENCOUNTER — Telehealth: Payer: Self-pay | Admitting: Internal Medicine

## 2019-10-10 DIAGNOSIS — M533 Sacrococcygeal disorders, not elsewhere classified: Secondary | ICD-10-CM | POA: Diagnosis not present

## 2019-10-10 DIAGNOSIS — M5416 Radiculopathy, lumbar region: Secondary | ICD-10-CM | POA: Diagnosis not present

## 2019-10-10 NOTE — Telephone Encounter (Signed)
Scheduled per 8/31 los. Pt is aware of appt on 02/19/2020.

## 2019-10-18 ENCOUNTER — Inpatient Hospital Stay: Payer: Medicare Other

## 2019-10-18 ENCOUNTER — Other Ambulatory Visit: Payer: Self-pay

## 2019-10-18 ENCOUNTER — Inpatient Hospital Stay: Payer: Medicare Other | Attending: Physician Assistant | Admitting: Nutrition

## 2019-10-18 ENCOUNTER — Inpatient Hospital Stay (HOSPITAL_BASED_OUTPATIENT_CLINIC_OR_DEPARTMENT_OTHER): Payer: Medicare Other | Admitting: Internal Medicine

## 2019-10-18 ENCOUNTER — Encounter: Payer: Self-pay | Admitting: Internal Medicine

## 2019-10-18 VITALS — BP 142/68 | HR 54 | Temp 97.5°F | Resp 20 | Ht 60.0 in | Wt 173.3 lb

## 2019-10-18 DIAGNOSIS — C3492 Malignant neoplasm of unspecified part of left bronchus or lung: Secondary | ICD-10-CM

## 2019-10-18 DIAGNOSIS — I25118 Atherosclerotic heart disease of native coronary artery with other forms of angina pectoris: Secondary | ICD-10-CM | POA: Diagnosis not present

## 2019-10-18 DIAGNOSIS — Z5112 Encounter for antineoplastic immunotherapy: Secondary | ICD-10-CM

## 2019-10-18 DIAGNOSIS — Z95828 Presence of other vascular implants and grafts: Secondary | ICD-10-CM

## 2019-10-18 DIAGNOSIS — Z79899 Other long term (current) drug therapy: Secondary | ICD-10-CM | POA: Diagnosis not present

## 2019-10-18 DIAGNOSIS — C3412 Malignant neoplasm of upper lobe, left bronchus or lung: Secondary | ICD-10-CM | POA: Insufficient documentation

## 2019-10-18 DIAGNOSIS — C3491 Malignant neoplasm of unspecified part of right bronchus or lung: Secondary | ICD-10-CM

## 2019-10-18 DIAGNOSIS — I10 Essential (primary) hypertension: Secondary | ICD-10-CM | POA: Diagnosis not present

## 2019-10-18 DIAGNOSIS — R5383 Other fatigue: Secondary | ICD-10-CM

## 2019-10-18 DIAGNOSIS — C7931 Secondary malignant neoplasm of brain: Secondary | ICD-10-CM | POA: Diagnosis not present

## 2019-10-18 LAB — CBC WITH DIFFERENTIAL (CANCER CENTER ONLY)
Abs Immature Granulocytes: 0.01 10*3/uL (ref 0.00–0.07)
Basophils Absolute: 0 10*3/uL (ref 0.0–0.1)
Basophils Relative: 1 %
Eosinophils Absolute: 0.4 10*3/uL (ref 0.0–0.5)
Eosinophils Relative: 6 %
HCT: 35 % — ABNORMAL LOW (ref 36.0–46.0)
Hemoglobin: 10.7 g/dL — ABNORMAL LOW (ref 12.0–15.0)
Immature Granulocytes: 0 %
Lymphocytes Relative: 25 %
Lymphs Abs: 1.5 10*3/uL (ref 0.7–4.0)
MCH: 24.2 pg — ABNORMAL LOW (ref 26.0–34.0)
MCHC: 30.6 g/dL (ref 30.0–36.0)
MCV: 79 fL — ABNORMAL LOW (ref 80.0–100.0)
Monocytes Absolute: 0.7 10*3/uL (ref 0.1–1.0)
Monocytes Relative: 12 %
Neutro Abs: 3.3 10*3/uL (ref 1.7–7.7)
Neutrophils Relative %: 56 %
Platelet Count: 272 10*3/uL (ref 150–400)
RBC: 4.43 MIL/uL (ref 3.87–5.11)
RDW: 16 % — ABNORMAL HIGH (ref 11.5–15.5)
WBC Count: 5.9 10*3/uL (ref 4.0–10.5)
nRBC: 0 % (ref 0.0–0.2)

## 2019-10-18 LAB — CMP (CANCER CENTER ONLY)
ALT: 9 U/L (ref 0–44)
AST: 19 U/L (ref 15–41)
Albumin: 3.6 g/dL (ref 3.5–5.0)
Alkaline Phosphatase: 74 U/L (ref 38–126)
Anion gap: 8 (ref 5–15)
BUN: 15 mg/dL (ref 8–23)
CO2: 27 mmol/L (ref 22–32)
Calcium: 9.6 mg/dL (ref 8.9–10.3)
Chloride: 106 mmol/L (ref 98–111)
Creatinine: 1.76 mg/dL — ABNORMAL HIGH (ref 0.44–1.00)
GFR, Est AFR Am: 34 mL/min — ABNORMAL LOW (ref >60)
GFR, Estimated: 30 mL/min — ABNORMAL LOW (ref >60)
Glucose, Bld: 102 mg/dL — ABNORMAL HIGH (ref 70–99)
Potassium: 3.9 mmol/L (ref 3.5–5.1)
Sodium: 141 mmol/L (ref 135–145)
Total Bilirubin: 0.4 mg/dL (ref 0.3–1.2)
Total Protein: 7.6 g/dL (ref 6.5–8.1)

## 2019-10-18 LAB — TSH: TSH: 1.045 u[IU]/mL (ref 0.308–3.960)

## 2019-10-18 MED ORDER — SODIUM CHLORIDE 0.9% FLUSH
10.0000 mL | INTRAVENOUS | Status: DC | PRN
  Administered 2019-10-18: 10 mL
  Filled 2019-10-18: qty 10

## 2019-10-18 MED ORDER — SODIUM CHLORIDE 0.9 % IV SOLN
200.0000 mg | Freq: Once | INTRAVENOUS | Status: AC
  Administered 2019-10-18: 200 mg via INTRAVENOUS
  Filled 2019-10-18: qty 8

## 2019-10-18 MED ORDER — SODIUM CHLORIDE 0.9 % IV SOLN
Freq: Once | INTRAVENOUS | Status: AC
  Filled 2019-10-18: qty 250

## 2019-10-18 MED ORDER — HEPARIN SOD (PORK) LOCK FLUSH 100 UNIT/ML IV SOLN
500.0000 [IU] | Freq: Once | INTRAVENOUS | Status: AC | PRN
  Administered 2019-10-18: 500 [IU]
  Filled 2019-10-18: qty 5

## 2019-10-18 NOTE — Progress Notes (Signed)
64 year old female diagnosed with metastatic non-small cell lung cancer in July 2020.  She is followed by Dr. Julien Nordmann.  She is receiving Keytruda.  Past medical history includes CAD, diabetes type 2, GERD, hiatal hernia, and hypertension.  Medications include vitamin D, folic acid, Amaryl, Glucophage, Compazine, and Metamucil.  Labs were reviewed.  Height: 5 feet 0 inches. Weight: 173.6 pounds on August 31. Usual body weight: 180 pounds in January 2021. BMI: 33.9.  Patient is interested in weight loss and wanted to follow a keto diet.  MD does not recommend therefore consult received for healthy diet information.  Nutrition diagnosis: Food and nutrition related knowledge deficit related to and obesity as evidenced by BMI of 33.9.  Intervention: Educated patient on healthy plant-based diet with adequate calories and protein to provide slow, safe weight loss of 1 pound per week. Reviewed no concentrated sweet diet with patient Provided fact sheets. Questions were answered.  Teach back method used.  Contact information given.  Food and nutrition related knowledge deficit resolved.  **Disclaimer: This note was dictated with voice recognition software. Similar sounding words can inadvertently be transcribed and this note may contain transcription errors which may not have been corrected upon publication of note.**

## 2019-10-18 NOTE — Patient Instructions (Signed)

## 2019-10-18 NOTE — Patient Instructions (Signed)
[facility] Discharge Instructions for Patients Receiving Chemotherapy  Today you received the following chemotherapy agents:  Keytruda.  To help prevent nausea and vomiting after your treatment, we encourage you to take your nausea medication as directed.   If you develop nausea and vomiting that is not controlled by your nausea medication, call the clinic.   BELOW ARE SYMPTOMS THAT SHOULD BE REPORTED IMMEDIATELY:  *FEVER GREATER THAN 100.5 F  *CHILLS WITH OR WITHOUT FEVER  NAUSEA AND VOMITING THAT IS NOT CONTROLLED WITH YOUR NAUSEA MEDICATION  *UNUSUAL SHORTNESS OF BREATH  *UNUSUAL BRUISING OR BLEEDING  TENDERNESS IN MOUTH AND THROAT WITH OR WITHOUT PRESENCE OF ULCERS  *URINARY PROBLEMS  *BOWEL PROBLEMS  UNUSUAL RASH Items with * indicate a potential emergency and should be followed up as soon as possible.  Feel free to call the clinic should you have any questions or concerns. The clinic phone number is [phone].  Please show the CHEMO ALERT CARD at check-in to the Emergency Department and triage nurse.    

## 2019-10-18 NOTE — Progress Notes (Signed)
Okay to treat with elevated creat per Dr. Julien Nordmann

## 2019-10-18 NOTE — Progress Notes (Signed)
[place] [phone]   [phone]  OFFICE PROGRESS NOTE  [name], MD [address] [place] [address]  DIAGNOSIS: Stage IV (T1c, N1, M1c ) non-small cell lung cancer, adenocarcinoma diagnosed in July 2020 and presented with left upper lobe lung nodule in addition to right upper lobe lung nodule with left hilar lymphadenopathy as well as multiple brain metastasis.  Biomarker Findings Microsatellite status - MS-Stable Tumor Mutational Burden - 4 Muts/Mb Genomic Findings For a complete list of the genes assayed, please refer to the Appendix. ATM G204* KRAS G12C PDGFRA P122T SMO R199W 7 Disease relevant genes with no reportable alterations: ALK, BRAF, EGFR, ERBB2, MET, RET, ROS1  PDL 1 expression was 1%  PRIOR THERAPY: SBRT to multiple brain metastasis under the care of Dr. Isidore Moos.  CURRENT THERAPY: Systemic chemotherapy with carboplatin for AUC of 5, Alimta 500 mg/M2 and Keytruda 200 mg IV every 3 weeks.  First dose October 05, 2018.  Status post 18 cycles.  Starting from cycle #5 the patient will be on treatment with maintenance Alimta 500 mg/M2 and Keytruda 200 mg IV every 3 weeks.  She has been on treatment with single agent Keytruda recently secondary to renal insufficiency.  INTERVAL HISTORY: Carolyn [name] 64 y.o. female returns to the clinic today for follow-up visit.  The patient is feeling fine today with no concerning complaints except for fatigue and shortness of breath with exertion.  She denied having any current chest pain, cough or hemoptysis.  She denied having any nausea, vomiting, diarrhea or constipation.  She denied having any headache or visual changes.  She has no weight loss or night sweats.  She has no fever or chills.  The patient is here today for evaluation before starting cycle #19 of her treatment.  MEDICAL HISTORY: Past Medical History:  Diagnosis Date  . Anginal pain ([place])    " with  exertion "  . Arthritis    " 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 "  . Asthma    h/o  . Coronary artery disease   . Diabetes mellitus without complication ([place])    Type II  . GERD (gastroesophageal reflux disease)   . H/O hiatal hernia   . Heart murmur   . History of radiation therapy 09/22/2018   stereotactic radiosurgery   . 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 Hypertension   . nscl ca dx'd 08/07/18  . Persistent headaches    h/o  . Pneumonia    hx of PNA  . Shortness of breath   . Vaginal dryness    h/o    ALLERGIES:  is allergic to lisinopril.  MEDICATIONS:  Current Outpatient Medications  Medication Sig Dispense Refill  . acetaminophen (TYLENOL) 500 MG tablet Take 1,000 mg by mouth every 8 (eight) hours as needed for mild pain, fever or headache.     Marland Kitchen amLODipine (NORVASC) 5 MG tablet Take 1 tablet by mouth once daily 90 tablet 2  . aspirin EC 81 MG tablet Take 81 mg by mouth daily.    . carvedilol (COREG) 25 MG tablet Take 1/2 (one-half) tablet by mouth twice daily 180 tablet 0  . cetirizine (ZYRTEC) 10 MG tablet Take 10 mg by mouth daily.     . Cholecalciferol (VITAMIN D-3) 125 MCG (5000 UT) TABS Take 5,000 Units by mouth daily.    . Chromium Picolinate 1000 MCG TABS Take 1,000 mg by mouth daily.    Marland Kitchen  folic acid (FOLVITE) 1 MG tablet Take 1 tablet (1 mg total) by mouth daily. (Patient not taking: Reported on 09/27/2019) 30 tablet 2  . glimepiride (AMARYL) 1 MG tablet Take 0.5 mg by mouth daily as needed.  (Patient not taking: Reported on 09/27/2019)    . lidocaine-prilocaine (EMLA) cream Apply to the Port-A-Cath site 30 to 60 minutes before chemotherapy. 30 g 2  . metFORMIN (GLUCOPHAGE) 500 MG tablet Take 500 mg by mouth 2 (two) times daily with a meal.     . methocarbamol (ROBAXIN) 500 MG tablet Take 1 tablet (500 mg total) by mouth every 6 (six) hours as needed for muscle spasms. 21 tablet 0  . nitroGLYCERIN (NITROSTAT) 0.4 MG SL tablet Place 1 tablet (0.4 mg total) under the tongue every 5 (five)  minutes as needed for chest pain. 25 tablet 2  . ONETOUCH ULTRA test strip USE 1 STRIP TO CHECK GLUCOSE THREE TIMES DAILY    . Pitavastatin Calcium (LIVALO) 4 MG TABS Take 4 mg by mouth at bedtime.     Vladimir Faster Glycol-Propyl Glycol (SYSTANE) 0.4-0.3 % SOLN Place 1 drop into both eyes every 6 (six) hours as needed (dry eyes).     . prochlorperazine (COMPAZINE) 10 MG tablet Take 1 tablet (10 mg total) by mouth every 6 (six) hours as needed for nausea or vomiting. 30 tablet 2  . psyllium (METAMUCIL) 58.6 % packet Take 1 packet by mouth at bedtime as needed (constipation).     Marland Kitchen telmisartan (MICARDIS) 40 MG tablet Take 40 mg by mouth daily.    Marland Kitchen zolpidem (AMBIEN) 10 MG tablet Take 2.5 mg by mouth at bedtime.  (Patient not taking: Reported on 09/27/2019)     No current facility-administered medications for this visit.    SURGICAL HISTORY:  Past Surgical History:  Procedure Laterality Date  . ABDOMINAL HYSTERECTOMY    . bone spur    . CARDIAC CATHETERIZATION  06/13/2012  . carpel tunnel surgery Left   . COLONOSCOPY     9 years ago  . CORONARY ANGIOPLASTY  06/13/2012  . EYE SURGERY     cyst left eye   . IR IMAGING GUIDED PORT INSERTION  10/10/2018  . LEFT HEART CATHETERIZATION WITH CORONARY ANGIOGRAM N/A 06/13/2012   Procedure: LEFT HEART CATHETERIZATION WITH CORONARY ANGIOGRAM;  Surgeon: Laverda Page, MD;  Location: [facility] CATH LAB;  Service: Cardiovascular;  Laterality: N/A;  . NECK SURGERY    . rotator cuff surgery Right 2015  . VIDEO BRONCHOSCOPY WITH ENDOBRONCHIAL NAVIGATION N/A 09/06/2018   Procedure: VIDEO BRONCHOSCOPY WITH ENDOBRONCHIAL NAVIGATION, left upper lung;  Surgeon: Lajuana Matte, MD;  Location: [facility];  Service: Thoracic;  Laterality: N/A;  . VIDEO BRONCHOSCOPY WITH ENDOBRONCHIAL ULTRASOUND Left 09/06/2018   Procedure: VIDEO BRONCHOSCOPY WITH ENDOBRONCHIAL ULTRASOUND, left lung;  Surgeon: Lajuana Matte, MD;  Location: [facility];  Service: Thoracic;  Laterality: Left;   . WISDOM TOOTH EXTRACTION      REVIEW OF SYSTEMS:  A comprehensive review of systems was negative except for: Constitutional: positive for fatigue Respiratory: positive for dyspnea on exertion   PHYSICAL EXAMINATION: General appearance: alert, cooperative, fatigued and no distress Head: Normocephalic, without obvious abnormality, atraumatic Neck: no adenopathy, no JVD, supple, symmetrical, trachea midline and thyroid not enlarged, symmetric, no tenderness/mass/nodules Lymph nodes: Cervical, supraclavicular, and axillary nodes normal. Resp: clear to auscultation bilaterally Back: symmetric, no curvature. ROM normal. No CVA tenderness. Cardio: regular rate and rhythm, S1, S2 normal, no murmur, click, rub or  gallop GI: soft, non-tender; bowel sounds normal; no masses,  no organomegaly Extremities: extremities normal, atraumatic, no cyanosis or edema  ECOG PERFORMANCE STATUS: 1 - Symptomatic but completely ambulatory  Blood pressure (!) 142/68, pulse (!) 54, temperature (!) 97.5 F (36.4 C), temperature source Tympanic, resp. rate 20, height 5' (1.524 m), weight 173 lb 4.8 oz (78.6 kg), SpO2 100 %.  LABORATORY DATA: Lab Results  Component Value Date   WBC 5.9 10/18/2019   HGB 10.7 (L) 10/18/2019   HCT 35.0 (L) 10/18/2019   MCV 79.0 (L) 10/18/2019   PLT 272 10/18/2019      Chemistry      Component Value Date/Time   NA 141 10/18/2019 1150   K 3.9 10/18/2019 1150   CL 106 10/18/2019 1150   CO2 27 10/18/2019 1150   BUN 15 10/18/2019 1150   CREATININE 1.76 (H) 10/18/2019 1150      Component Value Date/Time   CALCIUM 9.6 10/18/2019 1150   ALKPHOS 74 10/18/2019 1150   AST 19 10/18/2019 1150   ALT 9 10/18/2019 1150   BILITOT 0.4 10/18/2019 1150       RADIOGRAPHIC STUDIES: MR BRAIN W WO CONTRAST  Addendum Date: 10/05/2019   ADDENDUM REPORT: 10/05/2019 16:30 ADDENDUM: Contrast was administered via indwelling port accessed at [place] [facility] at [address]  Electronically Signed   By: Macy Mis M.D.   On: 10/05/2019 16:30   Result Date: 10/05/2019 CLINICAL DATA:  Metastatic lung cancer, prior radiation, follow-up EXAM: MRI HEAD WITHOUT AND WITH CONTRAST TECHNIQUE: Multiplanar, multiecho pulse sequences of the brain and surrounding structures were obtained without and with intravenous contrast. CONTRAST:  68mL MULTIHANCE GADOBENATE DIMEGLUMINE 529 MG/ML IV SOLN COMPARISON:  07/02/2019 FINDINGS: Brain: There is no acute infarction or intracranial hemorrhage. There is no intracranial mass, mass effect, or edema. There is no hydrocephalus or extra-axial fluid collection. Few small foci of T2 hyperintensity in the supratentorial white matter, which may reflect minor chronic microvascular ischemic changes. Unchanged bilateral inferior cerebellar encephalomalacia. Ventricles and sulci are normal in size and configuration. No abnormal enhancement. Vascular: Major vessel flow voids at the skull base are preserved. Skull and upper cervical spine: Normal marrow signal is preserved. Postsurgical changes of anterior fusion extending inferiorly from C3. Sinuses/Orbits: Minor mucosal thickening.  Orbits are unremarkable. Other: Sella is unremarkable.  Mastoid air cells are clear. IMPRESSION: No evidence of intracranial metastatic disease. Electronically Signed: By: Macy Mis M.D. On: 10/05/2019 10:06    ASSESSMENT AND PLAN: This is a very pleasant 64 years old African-American female with likely stage IV (T1c, N1, M1C) non-small cell lung cancer, adenocarcinoma with K-ras G12C mutation presented with left upper lobe lung nodule in addition to right upper lobe pulmonary nodule as well as left hilar adenopathy and metastatic lesion to the brain. Molecular studies showed no actionable mutation and PDL 1 expression was 1%. The patient underwent SBRT to the metastatic brain lesion under the care of Dr. Isidore Moos. The patient is currently undergoing systemic chemotherapy  with carboplatin for AUC of 5, Alimta 500 mg/M2 and Keytruda 200 mg IV every 3 weeks is status post 18 cycles.  Starting from cycle #5 she is on maintenance treatment with Alimta and Keytruda every 3 weeks with only Keytruda on the last few cycles because of the renal insufficiency. The patient has been tolerating her treatment well with no concerning adverse effects. I recommended for her to proceed with cycle #19 today as planned. The patient will come back  for follow-up visit in 3 weeks for evaluation before the next cycle of her treatment. For the renal insufficiency she is followed by nephrology. She was advised to call immediately if she has any concerning symptoms in the interval. The patient voices understanding of current disease status and treatment options and is in agreement with the current care plan. All questions were answered. The patient knows to call the clinic with any problems, questions or concerns. We can certainly see the patient much sooner if necessary.  Disclaimer: This note was dictated with voice recognition software. Similar sounding words can inadvertently be transcribed and may not be corrected upon review.

## 2019-10-25 ENCOUNTER — Other Ambulatory Visit: Payer: Self-pay | Admitting: *Deleted

## 2019-11-05 ENCOUNTER — Other Ambulatory Visit: Payer: Self-pay | Admitting: Radiation Therapy

## 2019-11-05 DIAGNOSIS — N1832 Chronic kidney disease, stage 3b: Secondary | ICD-10-CM | POA: Diagnosis not present

## 2019-11-05 DIAGNOSIS — C3492 Malignant neoplasm of unspecified part of left bronchus or lung: Secondary | ICD-10-CM | POA: Diagnosis not present

## 2019-11-05 DIAGNOSIS — Z23 Encounter for immunization: Secondary | ICD-10-CM | POA: Diagnosis not present

## 2019-11-05 DIAGNOSIS — E1122 Type 2 diabetes mellitus with diabetic chronic kidney disease: Secondary | ICD-10-CM | POA: Diagnosis not present

## 2019-11-05 DIAGNOSIS — I129 Hypertensive chronic kidney disease with stage 1 through stage 4 chronic kidney disease, or unspecified chronic kidney disease: Secondary | ICD-10-CM | POA: Diagnosis not present

## 2019-11-06 NOTE — Progress Notes (Signed)
[place] OFFICE PROGRESS NOTE  Carolyn Gravel, MD [address] [place] [address]  DIAGNOSIS: Stage IV(T1c, N1, M1 C)non-small cell lung cancer, adenocarcinoma. Shepresented with left upper lobe lung nodule in addition to right upper lobe lung nodule with left hilar lymphadenopathy as well as multiple brain metastasis.She was diagnosed in July of 2020  Biomarker Findings Microsatellite status - MS-Stable Tumor Mutational Burden - 4 Muts/Mb Genomic Findings For a complete list of the genes assayed, please refer to the Appendix. ATM G204* KRAS G12C PDGFRA P122T SMO R199W 7 Disease relevant genes with no reportable alterations: ALK, BRAF, EGFR, ERBB2, MET, RET, ROS1  PDL 1 expressionwas 1%  PRIOR THERAPY:  SBRT to multiple brain metastasis under the care of Dr. Isidore [name].Completed in August 2020.  CURRENT THERAPY: Systemic chemotherapy with Carboplatin for an AUC of 5, Alimta 500 mg/m2, and Keytruda 200 mg IV every 3 weeks. Status post19 cyclesof treatment. First dose on 10/05/2018.She started maintenance Keytruda and Alimta starting from cycle #5. Alimta was discontinued due to renal insufficiency.  INTERVAL HISTORY: Carolyn [name] 65 y.o. female returns to the clinic today for a follow up visit. The patient is feeling well today without any concerning complaints.She had a headache last night for which she has tylenol with her if needed. She follows closely with Dr. Mickeal [name], neuro-oncologist, for her history of brain metastases. She had follow up imaging a few weeks ago which was stable. The patient continues to tolerate treatment with single agent Keytrud[place] without any adverse sideeffects. He had a follow up recently with her nephrologist which was stable. Denies any fever, chills, night sweats, or weight loss. Denies any chest pain, shortness of breath, cough, or hemoptysis. Denies any nausea, vomiting, constipation, or diarrhea.  She denies  rashes or skin changes. She is inquiring about other iron rich foods to eat. She is here for evaluation before starting cycle #20.   MEDICAL HISTORY: Past Medical History:  Diagnosis Date  . Anginal pain ([place])    " with exertion "  . Arthritis    " 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 "  . Asthma    h/o  . Coronary artery disease   . Diabetes mellitus without complication ([place])    Type II  . GERD (gastroesophageal reflux disease)   . H/O hiatal hernia   . Heart murmur   . History of radiation therapy 09/22/2018   stereotactic radiosurgery   . 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 Hypertension   . nscl ca dx'd 08/07/18  . Persistent headaches    h/o  . Pneumonia    hx of PNA  . Shortness of breath   . Vaginal dryness    h/o    ALLERGIES:  is allergic to lisinopril.  MEDICATIONS:  Current Outpatient Medications  Medication Sig Dispense Refill  . acetaminophen (TYLENOL) 500 MG tablet Take 1,000 mg by mouth every 8 (eight) hours as needed for mild pain, fever or headache.     Marland Kitchen amLODipine (NORVASC) 5 MG tablet Take 1 tablet by mouth once daily 90 tablet 2  . aspirin EC 81 MG tablet Take 81 mg by mouth daily.    . carvedilol (COREG) 25 MG tablet Take 1/2 (one-half) tablet by mouth twice daily 180 tablet 0  . cetirizine (ZYRTEC) 10 MG tablet Take 10 mg by mouth daily.     . Cholecalciferol (VITAMIN D-3) 125 MCG (5000 UT) TABS Take 5,000 Units by mouth daily.    Marland Kitchen  Chromium Picolinate 1000 MCG TABS Take 1,000 mg by mouth daily.    . famotidine (PEPCID) 20 MG tablet Take 20 mg by mouth 2 (two) times daily.    . folic acid (FOLVITE) 1 MG tablet Take 1 tablet (1 mg total) by mouth daily. (Patient not taking: Reported on 09/27/2019) 30 tablet 2  . glimepiride (AMARYL) 1 MG tablet Take 0.5 mg by mouth daily as needed.  (Patient not taking: Reported on 09/27/2019)    . Lancets (ONETOUCH DELICA PLUS [id number]) MISC SMARTSIG:1 Topical 3 Times Daily    . lidocaine-prilocaine (EMLA) cream Apply to the Port-A-Cath site 30 to 60  minutes before chemotherapy. 30 g 2  . metFORMIN (GLUCOPHAGE) 500 MG tablet Take 500 mg by mouth 2 (two) times daily with a meal.     . methocarbamol (ROBAXIN) 500 MG tablet Take 1 tablet (500 mg total) by mouth every 6 (six) hours as needed for muscle spasms. 21 tablet 0  . nitroGLYCERIN (NITROSTAT) 0.4 MG SL tablet Place 1 tablet (0.4 mg total) under the tongue every 5 (five) minutes as needed for chest pain. 25 tablet 2  . ONETOUCH ULTRA test strip USE 1 STRIP TO CHECK GLUCOSE THREE TIMES DAILY    . Pitavastatin Calcium (LIVALO) 4 MG TABS Take 4 mg by mouth at bedtime.     Vladimir Faster Glycol-Propyl Glycol (SYSTANE) 0.4-0.3 % SOLN Place 1 drop into both eyes every 6 (six) hours as needed (dry eyes).     . prochlorperazine (COMPAZINE) 10 MG tablet Take 1 tablet (10 mg total) by mouth every 6 (six) hours as needed for nausea or vomiting. 30 tablet 2  . psyllium (METAMUCIL) 58.6 % packet Take 1 packet by mouth at bedtime as needed (constipation).     Marland Kitchen telmisartan (MICARDIS) 40 MG tablet Take 40 mg by mouth daily.    Marland Kitchen zolpidem (AMBIEN) 10 MG tablet Take 2.5 mg by mouth at bedtime.  (Patient not taking: Reported on 09/27/2019)     No current facility-administered medications for this visit.    SURGICAL HISTORY:  Past Surgical History:  Procedure Laterality Date  . ABDOMINAL HYSTERECTOMY    . bone spur    . CARDIAC CATHETERIZATION  06/13/2012  . carpel tunnel surgery Left   . COLONOSCOPY     9 years ago  . CORONARY ANGIOPLASTY  06/13/2012  . EYE SURGERY     cyst left eye   . IR IMAGING GUIDED PORT INSERTION  10/10/2018  . LEFT HEART CATHETERIZATION WITH CORONARY ANGIOGRAM N/A 06/13/2012   Procedure: LEFT HEART CATHETERIZATION WITH CORONARY ANGIOGRAM;  Surgeon: Laverda Page, MD;  Location: [facility] CATH LAB;  Service: Cardiovascular;  Laterality: N/A;  . NECK SURGERY    . rotator cuff surgery Right 2015  . VIDEO BRONCHOSCOPY WITH ENDOBRONCHIAL NAVIGATION N/A 09/06/2018   Procedure: VIDEO  BRONCHOSCOPY WITH ENDOBRONCHIAL NAVIGATION, left upper lung;  Surgeon: Lajuana Matte, MD;  Location: [facility];  Service: Thoracic;  Laterality: N/A;  . VIDEO BRONCHOSCOPY WITH ENDOBRONCHIAL ULTRASOUND Left 09/06/2018   Procedure: VIDEO BRONCHOSCOPY WITH ENDOBRONCHIAL ULTRASOUND, left lung;  Surgeon: Lajuana Matte, MD;  Location: [facility];  Service: Thoracic;  Laterality: Left;  . WISDOM TOOTH EXTRACTION      REVIEW OF SYSTEMS:   Review of Systems  Constitutional: Negative for appetite change, chills, fatigue, fever and unexpected weight change.  HENT:   Negative for mouth sores, nosebleeds, sore throat and trouble swallowing.   Eyes: Negative for eye problems and icterus.  Respiratory: Negative for cough, hemoptysis, shortness of breath and wheezing.   Cardiovascular: Negative for chest pain and leg swelling.  Gastrointestinal: Negative for abdominal pain, constipation, diarrhea, nausea and vomiting.  Genitourinary: Negative for bladder incontinence, difficulty urinating, dysuria, frequency and hematuria.   Musculoskeletal: Negative for back pain, gait problem, neck pain and neck stiffness.  Skin: Negative for itching and rash.  Neurological: Positive for headache x1 last night. Negative for dizziness, extremity weakness, gait problem, light-headedness and seizures.  Hematological: Negative for adenopathy. Does not bruise/bleed easily.  Psychiatric/Behavioral: Negative for confusion, depression and sleep disturbance. The patient is not nervous/anxious.     PHYSICAL EXAMINATION:  There were no vitals taken for this visit.  ECOG PERFORMANCE STATUS: 1 - Symptomatic but completely ambulatory  Physical Exam  Constitutional: Oriented to person, place, and time and well-developed, well-nourished, and in no distress.   HENT:  Head: Normocephalic and atraumatic.  Mouth/Throat: Oropharynx is clear and moist. No oropharyngeal exudate.  Eyes: Conjunctivae are normal. Right eye exhibits no  discharge. Left eye exhibits no discharge. No scleral icterus.  Neck: Normal range of motion. Neck supple.  Cardiovascular: Normal rate, regular rhythm, normal heart sounds and intact distal pulses.   Pulmonary/Chest: Effort normal and breath sounds normal. No respiratory distress. No wheezes. No rales.  Abdominal: Soft. Bowel sounds are normal. Exhibits no distension and no mass. There is no tenderness.  Musculoskeletal: Normal range of motion. Exhibits no edema.  Lymphadenopathy:    No cervical adenopathy.  Neurological: Alert and oriented to person, place, and time. Exhibits normal muscle tone. Gait normal. Coordination normal.  Skin: Skin is warm and dry. No rash noted. Not diaphoretic. No erythema. No pallor.  Psychiatric: Mood, memory and judgment normal.  Vitals reviewed.  LABORATORY DATA: Lab Results  Component Value Date   WBC 5.9 10/18/2019   HGB 10.7 (L) 10/18/2019   HCT 35.0 (L) 10/18/2019   MCV 79.0 (L) 10/18/2019   PLT 272 10/18/2019      Chemistry      Component Value Date/Time   NA 141 10/18/2019 1150   K 3.9 10/18/2019 1150   CL 106 10/18/2019 1150   CO2 27 10/18/2019 1150   BUN 15 10/18/2019 1150   CREATININE 1.76 (H) 10/18/2019 1150      Component Value Date/Time   CALCIUM 9.6 10/18/2019 1150   ALKPHOS 74 10/18/2019 1150   AST 19 10/18/2019 1150   ALT 9 10/18/2019 1150   BILITOT 0.4 10/18/2019 1150       RADIOGRAPHIC STUDIES:  No results found.   ASSESSMENT/PLAN:  This is a very pleasant 64 year old African-American female with stage IV (T1c, N1,M1c) non-small celllung cancer, adenocarcinoma. She presented with a dominant left upper lobe lung nodule in addition to a right upper lobe pulmonary nodule with left hilar lymphadenopathy. She also has metastatic disease to the brain.She has no actionable mutations and her PDL1 expression is 1%. She was diagnosed in July 2020.   The patient completed SBRT to the metastatic brain lesion under the care  of Dr. Isidore [name] in August 2020.   The patient is currently undergoing systemic chemotherapy withcarboplatin for an AUC of 5, Alimta 500 mg/m, and Keytruda 200 mg IV every 3 weeks. She is status post19cyclesof treatment. Starting from cycle #5, she has been on maintenance Alimta 500 mg/m2 and Keytruda 200 mg IV.She tolerated her treatment well without anyconcerningadverse side effects.Alimta wasdiscontinued due to renal insufficiency.   Labs were reviewed. Recommend that sheproceedwith cycle #20 today as  scheduled.  I will arrange for restaging CT scan to be performed prior to her next cycle of treatment. I will order it without contrast due to her renal insuffiencey.   We will see her back for follow-up visit in 3 weeks for evaluation and to review her scan results before starting cycle #21.  She was given a handout on iron rich diets on her AVS today.   She will take tylenol for her headache.   The patient was advised to call immediately if she has any concerning symptoms in the interval. The patient voices understanding of current disease status and treatment options and is in agreement with the current care plan. All questions were answered. The patient knows to call the clinic with any problems, questions or concerns. We can certainly see the patient much sooner if necessary    No orders of the defined types were placed in this encounter.    Teller Wakefield L Kanav Kazmierczak, PA-C 11/06/19

## 2019-11-08 ENCOUNTER — Inpatient Hospital Stay: Payer: Medicare Other

## 2019-11-08 ENCOUNTER — Inpatient Hospital Stay (HOSPITAL_BASED_OUTPATIENT_CLINIC_OR_DEPARTMENT_OTHER): Payer: Medicare Other | Admitting: Physician Assistant

## 2019-11-08 ENCOUNTER — Other Ambulatory Visit: Payer: Self-pay

## 2019-11-08 VITALS — BP 130/61 | HR 77 | Temp 97.8°F | Resp 19 | Ht 60.0 in | Wt 174.8 lb

## 2019-11-08 DIAGNOSIS — C3491 Malignant neoplasm of unspecified part of right bronchus or lung: Secondary | ICD-10-CM

## 2019-11-08 DIAGNOSIS — C3492 Malignant neoplasm of unspecified part of left bronchus or lung: Secondary | ICD-10-CM

## 2019-11-08 DIAGNOSIS — C7931 Secondary malignant neoplasm of brain: Secondary | ICD-10-CM | POA: Diagnosis not present

## 2019-11-08 DIAGNOSIS — R5383 Other fatigue: Secondary | ICD-10-CM

## 2019-11-08 DIAGNOSIS — Z79899 Other long term (current) drug therapy: Secondary | ICD-10-CM | POA: Diagnosis not present

## 2019-11-08 DIAGNOSIS — C3412 Malignant neoplasm of upper lobe, left bronchus or lung: Secondary | ICD-10-CM | POA: Diagnosis not present

## 2019-11-08 DIAGNOSIS — Z5112 Encounter for antineoplastic immunotherapy: Secondary | ICD-10-CM | POA: Diagnosis not present

## 2019-11-08 DIAGNOSIS — Z95828 Presence of other vascular implants and grafts: Secondary | ICD-10-CM

## 2019-11-08 LAB — CBC WITH DIFFERENTIAL (CANCER CENTER ONLY)
Abs Immature Granulocytes: 0.02 10*3/uL (ref 0.00–0.07)
Basophils Absolute: 0 10*3/uL (ref 0.0–0.1)
Basophils Relative: 1 %
Eosinophils Absolute: 0.4 10*3/uL (ref 0.0–0.5)
Eosinophils Relative: 6 %
HCT: 34.5 % — ABNORMAL LOW (ref 36.0–46.0)
Hemoglobin: 10.6 g/dL — ABNORMAL LOW (ref 12.0–15.0)
Immature Granulocytes: 0 %
Lymphocytes Relative: 25 %
Lymphs Abs: 1.5 10*3/uL (ref 0.7–4.0)
MCH: 24.7 pg — ABNORMAL LOW (ref 26.0–34.0)
MCHC: 30.7 g/dL (ref 30.0–36.0)
MCV: 80.2 fL (ref 80.0–100.0)
Monocytes Absolute: 0.6 10*3/uL (ref 0.1–1.0)
Monocytes Relative: 10 %
Neutro Abs: 3.4 10*3/uL (ref 1.7–7.7)
Neutrophils Relative %: 58 %
Platelet Count: 229 10*3/uL (ref 150–400)
RBC: 4.3 MIL/uL (ref 3.87–5.11)
RDW: 16 % — ABNORMAL HIGH (ref 11.5–15.5)
WBC Count: 5.9 10*3/uL (ref 4.0–10.5)
nRBC: 0 % (ref 0.0–0.2)

## 2019-11-08 LAB — CMP (CANCER CENTER ONLY)
ALT: 12 U/L (ref 0–44)
AST: 19 U/L (ref 15–41)
Albumin: 3.4 g/dL — ABNORMAL LOW (ref 3.5–5.0)
Alkaline Phosphatase: 74 U/L (ref 38–126)
Anion gap: 8 (ref 5–15)
BUN: 15 mg/dL (ref 8–23)
CO2: 29 mmol/L (ref 22–32)
Calcium: 9.2 mg/dL (ref 8.9–10.3)
Chloride: 104 mmol/L (ref 98–111)
Creatinine: 1.67 mg/dL — ABNORMAL HIGH (ref 0.44–1.00)
GFR, Est AFR Am: 37 mL/min — ABNORMAL LOW (ref >60)
GFR, Estimated: 32 mL/min — ABNORMAL LOW (ref >60)
Glucose, Bld: 131 mg/dL — ABNORMAL HIGH (ref 70–99)
Potassium: 3.9 mmol/L (ref 3.5–5.1)
Sodium: 141 mmol/L (ref 135–145)
Total Bilirubin: 0.3 mg/dL (ref 0.3–1.2)
Total Protein: 7.2 g/dL (ref 6.5–8.1)

## 2019-11-08 LAB — TSH: TSH: 1.681 u[IU]/mL (ref 0.308–3.960)

## 2019-11-08 MED ORDER — SODIUM CHLORIDE 0.9% FLUSH
10.0000 mL | INTRAVENOUS | Status: DC | PRN
  Administered 2019-11-08: 10 mL
  Filled 2019-11-08: qty 10

## 2019-11-08 MED ORDER — SODIUM CHLORIDE 0.9 % IV SOLN
200.0000 mg | Freq: Once | INTRAVENOUS | Status: AC
  Administered 2019-11-08: 200 mg via INTRAVENOUS
  Filled 2019-11-08: qty 8

## 2019-11-08 MED ORDER — SODIUM CHLORIDE 0.9 % IV SOLN
Freq: Once | INTRAVENOUS | Status: AC
  Filled 2019-11-08: qty 250

## 2019-11-08 MED ORDER — HEPARIN SOD (PORK) LOCK FLUSH 100 UNIT/ML IV SOLN
500.0000 [IU] | Freq: Once | INTRAVENOUS | Status: AC | PRN
  Administered 2019-11-08: 500 [IU]
  Filled 2019-11-08: qty 5

## 2019-11-08 NOTE — Patient Instructions (Signed)
[facility] Discharge Instructions for Patients Receiving Chemotherapy  Today you received the following chemotherapy agents:  Keytruda.  To help prevent nausea and vomiting after your treatment, we encourage you to take your nausea medication as directed.   If you develop nausea and vomiting that is not controlled by your nausea medication, call the clinic.   BELOW ARE SYMPTOMS THAT SHOULD BE REPORTED IMMEDIATELY:  *FEVER GREATER THAN 100.5 F  *CHILLS WITH OR WITHOUT FEVER  NAUSEA AND VOMITING THAT IS NOT CONTROLLED WITH YOUR NAUSEA MEDICATION  *UNUSUAL SHORTNESS OF BREATH  *UNUSUAL BRUISING OR BLEEDING  TENDERNESS IN MOUTH AND THROAT WITH OR WITHOUT PRESENCE OF ULCERS  *URINARY PROBLEMS  *BOWEL PROBLEMS  UNUSUAL RASH Items with * indicate a potential emergency and should be followed up as soon as possible.  Feel free to call the clinic should you have any questions or concerns. The clinic phone number is [phone].  Please show the CHEMO ALERT CARD at check-in to the Emergency Department and triage nurse.    

## 2019-11-08 NOTE — Patient Instructions (Signed)

## 2019-11-08 NOTE — Patient Instructions (Signed)

## 2019-11-08 NOTE — Progress Notes (Signed)
Per Cassie, PA okay for patient to receive treatment today with creatine 1.67.

## 2019-11-12 ENCOUNTER — Encounter (HOSPITAL_COMMUNITY): Payer: Self-pay | Admitting: *Deleted

## 2019-11-12 ENCOUNTER — Emergency Department (HOSPITAL_COMMUNITY): Payer: Medicare Other

## 2019-11-12 ENCOUNTER — Emergency Department (HOSPITAL_COMMUNITY)
Admission: EM | Admit: 2019-11-12 | Discharge: 2019-11-12 | Disposition: A | Discharge status: Home / Self Care | Payer: Medicare Other | Attending: Emergency Medicine | Admitting: Emergency Medicine

## 2019-11-12 ENCOUNTER — Other Ambulatory Visit: Payer: Self-pay

## 2019-11-12 DIAGNOSIS — Z87891 Personal history of nicotine dependence: Secondary | ICD-10-CM | POA: Insufficient documentation

## 2019-11-12 DIAGNOSIS — R079 Chest pain, unspecified: Secondary | ICD-10-CM | POA: Diagnosis not present

## 2019-11-12 DIAGNOSIS — Z7984 Long term (current) use of oral hypoglycemic drugs: Secondary | ICD-10-CM | POA: Insufficient documentation

## 2019-11-12 DIAGNOSIS — Z7982 Long term (current) use of aspirin: Secondary | ICD-10-CM | POA: Diagnosis not present

## 2019-11-12 DIAGNOSIS — E119 Type 2 diabetes mellitus without complications: Secondary | ICD-10-CM | POA: Diagnosis not present

## 2019-11-12 DIAGNOSIS — I10 Essential (primary) hypertension: Secondary | ICD-10-CM | POA: Diagnosis not present

## 2019-11-12 DIAGNOSIS — I2510 Atherosclerotic heart disease of native coronary artery without angina pectoris: Secondary | ICD-10-CM | POA: Diagnosis not present

## 2019-11-12 DIAGNOSIS — J90 Pleural effusion, not elsewhere classified: Secondary | ICD-10-CM | POA: Diagnosis not present

## 2019-11-12 DIAGNOSIS — Z79899 Other long term (current) drug therapy: Secondary | ICD-10-CM | POA: Insufficient documentation

## 2019-11-12 DIAGNOSIS — R918 Other nonspecific abnormal finding of lung field: Secondary | ICD-10-CM | POA: Diagnosis not present

## 2019-11-12 DIAGNOSIS — R0789 Other chest pain: Secondary | ICD-10-CM

## 2019-11-12 LAB — BASIC METABOLIC PANEL
Anion gap: 10 (ref 5–15)
BUN: 16 mg/dL (ref 8–23)
CO2: 28 mmol/L (ref 22–32)
Calcium: 9.2 mg/dL (ref 8.9–10.3)
Chloride: 102 mmol/L (ref 98–111)
Creatinine, Ser: 1.64 mg/dL — ABNORMAL HIGH (ref 0.44–1.00)
GFR calc Af Amer: 37 mL/min — ABNORMAL LOW (ref >60)
GFR calc non Af Amer: 32 mL/min — ABNORMAL LOW (ref >60)
Glucose, Bld: 99 mg/dL (ref 70–99)
Potassium: 4 mmol/L (ref 3.5–5.1)
Sodium: 140 mmol/L (ref 135–145)

## 2019-11-12 LAB — CBC
HCT: 36 % (ref 36.0–46.0)
Hemoglobin: 10.8 g/dL — ABNORMAL LOW (ref 12.0–15.0)
MCH: 24.3 pg — ABNORMAL LOW (ref 26.0–34.0)
MCHC: 30 g/dL (ref 30.0–36.0)
MCV: 80.9 fL (ref 80.0–100.0)
Platelets: 258 10*3/uL (ref 150–400)
RBC: 4.45 MIL/uL (ref 3.87–5.11)
RDW: 15.8 % — ABNORMAL HIGH (ref 11.5–15.5)
WBC: 5.8 10*3/uL (ref 4.0–10.5)
nRBC: 0 % (ref 0.0–0.2)

## 2019-11-12 LAB — TROPONIN I (HIGH SENSITIVITY)
Troponin I (High Sensitivity): 2 ng/L (ref <18)
Troponin I (High Sensitivity): <2 ng/L (ref <18)
Troponin I (High Sensitivity): 2 ng/L (ref <18)

## 2019-11-12 NOTE — ED Triage Notes (Signed)
Pt c/o intermittent mid chest pain that started yesterday morning. Denies SOB, n/v, dizziness, diaphoresis. Pt reports increased sneezing since Friday due to allergies and thought maybe that was why her chest was hurting but wanted to be checked out.

## 2019-11-12 NOTE — ED Provider Notes (Signed)
[facility] EMERGENCY DEPARTMENT Provider Note   CSN: [id number] Arrival date & time: 11/12/19  4010     History Chief Complaint  Patient presents with  . Chest Pain    Carolyn [name] is a 64 y.o. female.  64 year old female with history of non small cell lung cancer, metastatic disease, CAD, diabetes, HTN, presents with complaint of chest soreness. Patient reports her husband using a spray on Friday that caused her to sneeze all day. Since Saturday, she has experienced right and left side mid chest soreness off and on, lasting for a few minutes at a time.  Denies any associated shortness of breath or palpitations, chest wall pain, nausea, vomiting, diaphoresis.  Pain does not radiate and is not positional.  No other complaints or concerns.     HPI: A 64 year old patient with a history of treated diabetes, hypertension, hypercholesterolemia and obesity presents for evaluation of chest pain. Initial onset of pain was more than 6 hours ago. The patient's chest pain is well-localized and is not worse with exertion. The patient's chest pain is not middle- or left-sided, is not described as heaviness/pressure/tightness, is not sharp and does not radiate to the arms/jaw/neck. The patient does not complain of nausea and denies diaphoresis. The patient has no history of stroke, has no history of peripheral artery disease, has not smoked in the past 90 days and has no relevant family history of coronary artery disease (first degree relative at less than age 62).   Past Medical History:  Diagnosis Date  . Anginal pain ([place])    " with exertion "  . Arthritis    " 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 "  . Asthma    h/o  . Coronary artery disease   . Diabetes mellitus without complication ([place])    Type II  . GERD (gastroesophageal reflux disease)   . H/O hiatal hernia   . Heart murmur   . History of radiation therapy 09/22/2018   stereotactic radiosurgery   . 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 Hypertension   . nscl ca  dx'd 08/07/18  . Persistent headaches    h/o  . Pneumonia    hx of PNA  . Shortness of breath   . Vaginal dryness    h/o    Patient Active Problem List   Diagnosis Date Noted  . Elevated serum creatinine 04/12/2019  . Pyelonephritis 02/08/2019  . Port-A-Cath in place 10/19/2018  . Encounter for antineoplastic chemotherapy 09/28/2018  . Encounter for antineoplastic immunotherapy 09/28/2018  . Goals of care, counseling/discussion 09/28/2018  . Adenocarcinoma, lung ([place]) 09/12/2018  . Brain metastases ([place]) 08/30/2018  . Mass of upper lobe of left lung 08/17/2018  . Angina pectoris ([place]) 06/14/2012  . CAD (coronary artery disease), native coronary artery 06/14/2012  . S/P PTCA (percutaneous transluminal coronary angioplasty) 06/14/2012  . Hyperlipidemia 06/14/2012  . Essential hypertension 06/14/2012  . Pre-diabetes 06/14/2012    Past Surgical History:  Procedure Laterality Date  . ABDOMINAL HYSTERECTOMY    . bone spur    . CARDIAC CATHETERIZATION  06/13/2012  . carpel tunnel surgery Left   . COLONOSCOPY     9 years ago  . CORONARY ANGIOPLASTY  06/13/2012  . EYE SURGERY     cyst left eye   . IR IMAGING GUIDED PORT INSERTION  10/10/2018  . LEFT HEART CATHETERIZATION WITH CORONARY ANGIOGRAM N/A 06/13/2012   Procedure: LEFT HEART CATHETERIZATION WITH CORONARY ANGIOGRAM;  Surgeon: Laverda Page, MD;  Location: [facility];  Service: Cardiovascular;  Laterality: N/A;  . NECK SURGERY    . rotator cuff surgery Right 2015  . VIDEO BRONCHOSCOPY WITH ENDOBRONCHIAL NAVIGATION N/A 09/06/2018   Procedure: VIDEO BRONCHOSCOPY WITH ENDOBRONCHIAL NAVIGATION, left upper lung;  Surgeon: Lajuana Matte, MD;  Location: [facility];  Service: Thoracic;  Laterality: N/A;  . VIDEO BRONCHOSCOPY WITH ENDOBRONCHIAL ULTRASOUND Left 09/06/2018   Procedure: VIDEO BRONCHOSCOPY WITH ENDOBRONCHIAL ULTRASOUND, left lung;  Surgeon: Lajuana Matte, MD;  Location: [facility];  Service: Thoracic;  Laterality:  Left;  . WISDOM TOOTH EXTRACTION       OB History    Gravida  3   Para      Term      Preterm      AB      Living  3     SAB      TAB      Ectopic      Multiple      Live Births              Family History  Problem Relation Age of Onset  . Breast cancer Other   . Diabetes Mother   . Glaucoma Mother   . Hypertension Mother   . Hypertension Father   . Asthma Father   . Diabetes Sister   . Diabetes Brother   . Hypertension Sister     Social History   Tobacco Use  . Smoking status: Former Smoker    Packs/day: 0.50    Years: 30.00    Pack years: 15.00    Quit date: 02/09/1999    Years since quitting: 20.7  . Smokeless tobacco: Never Used  Vaping Use  . Vaping Use: Never used  Substance Use Topics  . Alcohol use: Not Currently  . Drug use: Not Currently    Home Medications Prior to Admission medications   Medication Sig Start Date End Date Taking? Authorizing Provider  acetaminophen (TYLENOL) 500 MG tablet Take 1,000 mg by mouth every 8 (eight) hours as needed for mild pain, fever or headache.    Yes [provider]  amLODipine (NORVASC) 5 MG tablet Take 1 tablet by mouth once daily 06/01/19  Yes Adrian Prows, MD  aspirin EC 81 MG tablet Take 81 mg by mouth daily.   Yes [provider]  carvedilol (COREG) 25 MG tablet Take 1/2 (one-half) tablet by mouth twice daily 07/03/19  Yes Adrian Prows, MD  cetirizine (ZYRTEC) 10 MG tablet Take 10 mg by mouth daily.    Yes [provider]  Cholecalciferol (VITAMIN D-3) 125 MCG (5000 UT) TABS Take 5,000 Units by mouth daily.   Yes [provider]  Chromium Picolinate 1000 MCG TABS Take 1,000 mg by mouth daily.   Yes [provider]  famotidine (PEPCID) 20 MG tablet Take 20 mg by mouth 2 (two) times daily. 08/28/19  Yes [provider]  lidocaine-prilocaine (EMLA) cream Apply to the Port-A-Cath site 30 to 60 minutes before chemotherapy. 09/27/19  Yes Heilingoetter,  Cassandra L, PA-C  metFORMIN (GLUCOPHAGE) 500 MG tablet Take 500 mg by mouth 2 (two) times daily with a meal.    Yes [provider]  nitroGLYCERIN (NITROSTAT) 0.4 MG SL tablet Place 1 tablet (0.4 mg total) under the tongue every 5 (five) minutes as needed for chest pain. 03/09/19  Yes Adrian Prows, MD  Pitavastatin Calcium (LIVALO) 4 MG TABS Take 4 mg by mouth at bedtime.    Yes [provider]  Polyethyl Glycol-Propyl Glycol (SYSTANE) 0.4-0.3 % SOLN Place 1 drop into both eyes every 6 (six) hours as needed (dry eyes).    Yes [provider]  Lancets (ONETOUCH DELICA PLUS [id number]) [place] SMARTSIG:1 Topical 3 Times Daily 08/01/19   [provider]  methocarbamol (ROBAXIN) 500 MG tablet Take 1 tablet (500 mg total) by mouth every 6 (six) hours as needed for muscle spasms. Patient not taking: Reported on 11/12/2019 03/25/17   Lily Kocher, PA-C  ONETOUCH ULTRA test strip USE 1 STRIP TO [place] DAILY 08/10/18   [provider]  prochlorperazine (COMPAZINE) 10 MG tablet Take 1 tablet (10 mg total) by mouth every 6 (six) hours as needed for nausea or vomiting. Patient not taking: Reported on 11/12/2019 03/22/19   Heilingoetter, Cassandra L, PA-C    Allergies    Lisinopril  Review of Systems   Review of Systems  Constitutional: Negative for fever.  Respiratory: Negative for chest tightness and shortness of breath.   Cardiovascular: Positive for chest pain. Negative for palpitations and leg swelling.  Gastrointestinal: Negative for nausea and vomiting.  Musculoskeletal: Negative for arthralgias and myalgias.  Skin: Negative for rash and wound.  Allergic/Immunologic: Positive for immunocompromised state.  Neurological: Negative for dizziness, weakness and numbness.  Psychiatric/Behavioral: Negative for confusion.  All other systems reviewed and are negative.   Physical Exam Updated Vital Signs BP (!) 151/78 (BP Location: Left Arm)    Pulse (!) 54   Temp 98 F (36.7 C) (Oral)   Resp 16   Ht 5\' 1"  (1.549 m)   Wt 78.9 kg   SpO2 100%   BMI 32.88 kg/m   Physical Exam Vitals and nursing note reviewed.  Constitutional:      General: She is not in acute distress.    Appearance: She is well-developed. She is not diaphoretic.  HENT:     Head: Normocephalic and atraumatic.  Cardiovascular:     Rate and Rhythm: Normal rate and regular rhythm.     Heart sounds: Normal heart sounds. No murmur heard.   Pulmonary:     Effort: Pulmonary effort is normal.     Breath sounds: Normal breath sounds. No decreased breath sounds.  Chest:     Chest wall: No mass, tenderness or crepitus.  Musculoskeletal:     Right lower leg: No edema.     Left lower leg: No edema.  Skin:    General: Skin is warm and dry.     Findings: No ecchymosis, erythema or rash.  Neurological:     Mental Status: She is alert and oriented to person, place, and time.  Psychiatric:        Behavior: Behavior normal.     ED Results / Procedures / Treatments   Labs (all labs ordered are listed, but only abnormal results are displayed) Labs Reviewed  BASIC METABOLIC PANEL - Abnormal; Notable for the following components:      Result Value   Creatinine, Ser 1.64 (*)    GFR calc non Af Amer 32 (*)    GFR calc Af Amer 37 (*)    All other components within normal limits  CBC - Abnormal; Notable for the following components:   Hemoglobin 10.8 (*)    MCH 24.3 (*)    RDW 15.8 (*)    All other components within normal limits  TROPONIN I (HIGH SENSITIVITY)  TROPONIN I (HIGH SENSITIVITY)  TROPONIN I (HIGH SENSITIVITY)    EKG EKG Interpretation  Date/Time:  Monday November 12 2019 09:03:22 EDT Ventricular Rate:  56 PR Interval:  158 QRS Duration: 72 QT Interval:  398 QTC Calculation: 384 R Axis:   61 Text Interpretation: Sinus bradycardia Nonspecific T wave abnormality Abnormal ECG No significant change since prior 6/20 Confirmed by Aletta Edouard  [phone]) on 11/12/2019 3:27:18 PM   Radiology DG Chest 2 View  Result Date: 11/12/2019 CLINICAL DATA:  Chest pain. EXAM: CHEST - 2 VIEW COMPARISON:  September 04, 2018. FINDINGS: The heart size and mediastinal contours are within normal limits. No pneumothorax or pleural effusion is noted. Right internal jugular Port-A-Cath is noted. Left upper lobe mass noted on prior exam is not well visualized currently. No definite acute abnormality is noted. The visualized skeletal structures are unremarkable. IMPRESSION: No active cardiopulmonary disease. Left upper lobe mass noted on prior exam is not well visualized currently. Electronically Signed   By: Marijo Conception M.D.   On: 11/12/2019 09:58    Procedures Procedures (including critical care time)  Medications Ordered in ED Medications - No data to display  ED Course  I have reviewed the triage vital signs and the nursing notes.  Pertinent labs & imaging results that were available during my care of the patient were reviewed by me and considered in my medical decision making (see chart for details).  Clinical Course as of Nov 11 1699  Mon Nov 12, 1139  [id number] 64 year old female with right and left mid chest soreness for the past 2 days. Well appearing on exam, no chest wall tenderness. Labs reassuring, no changes compared to prior CBC, BMP. Troponin x 2 negative, EKG without ischemic changes. HEAR score 5, discussed with patient, do not suspect cardiac etiology, recommend follow up with PCP, patient is agreeable, will return to ER for worsening or concerning symptoms.    [LM]    Clinical Course User Index [LM] Roque Lias   MDM Rules/Calculators/A&P HEAR Score: 5                        Final Clinical Impression(s) / ED Diagnoses Final diagnoses:  Atypical chest pain    Rx / DC Orders ED Discharge Orders    None       Tacy Learn, PA-C 11/12/19 1701    Milton Ferguson, MD 11/14/19 [phone]

## 2019-11-12 NOTE — Discharge Instructions (Addendum)
Follow-up with your primary care provider.  Return to ER for worsening or concerning symptoms.

## 2019-11-15 DIAGNOSIS — I2510 Atherosclerotic heart disease of native coronary artery without angina pectoris: Secondary | ICD-10-CM | POA: Diagnosis not present

## 2019-11-15 DIAGNOSIS — N183 Chronic kidney disease, stage 3 unspecified: Secondary | ICD-10-CM | POA: Diagnosis not present

## 2019-11-15 DIAGNOSIS — E1122 Type 2 diabetes mellitus with diabetic chronic kidney disease: Secondary | ICD-10-CM | POA: Diagnosis not present

## 2019-11-22 DIAGNOSIS — I2510 Atherosclerotic heart disease of native coronary artery without angina pectoris: Secondary | ICD-10-CM | POA: Diagnosis not present

## 2019-11-22 DIAGNOSIS — C3492 Malignant neoplasm of unspecified part of left bronchus or lung: Secondary | ICD-10-CM | POA: Diagnosis not present

## 2019-11-22 DIAGNOSIS — E559 Vitamin D deficiency, unspecified: Secondary | ICD-10-CM | POA: Diagnosis not present

## 2019-11-22 DIAGNOSIS — I10 Essential (primary) hypertension: Secondary | ICD-10-CM | POA: Diagnosis not present

## 2019-11-22 DIAGNOSIS — E1122 Type 2 diabetes mellitus with diabetic chronic kidney disease: Secondary | ICD-10-CM | POA: Diagnosis not present

## 2019-11-22 DIAGNOSIS — N183 Chronic kidney disease, stage 3 unspecified: Secondary | ICD-10-CM | POA: Diagnosis not present

## 2019-11-23 DIAGNOSIS — Z1231 Encounter for screening mammogram for malignant neoplasm of breast: Secondary | ICD-10-CM | POA: Diagnosis not present

## 2019-11-26 ENCOUNTER — Ambulatory Visit (HOSPITAL_COMMUNITY)
Admission: RE | Admit: 2019-11-26 | Discharge: 2019-11-26 | Disposition: A | Discharge status: Home / Self Care | Payer: Medicare Other | Source: Ambulatory Visit | Attending: Physician Assistant | Admitting: Physician Assistant

## 2019-11-26 ENCOUNTER — Other Ambulatory Visit: Payer: Self-pay

## 2019-11-26 ENCOUNTER — Encounter (HOSPITAL_COMMUNITY): Payer: Self-pay

## 2019-11-26 DIAGNOSIS — C3491 Malignant neoplasm of unspecified part of right bronchus or lung: Secondary | ICD-10-CM | POA: Insufficient documentation

## 2019-11-26 DIAGNOSIS — I2510 Atherosclerotic heart disease of native coronary artery without angina pectoris: Secondary | ICD-10-CM | POA: Diagnosis not present

## 2019-11-26 DIAGNOSIS — I708 Atherosclerosis of other arteries: Secondary | ICD-10-CM | POA: Diagnosis not present

## 2019-11-26 DIAGNOSIS — I700 Atherosclerosis of aorta: Secondary | ICD-10-CM | POA: Diagnosis not present

## 2019-11-26 DIAGNOSIS — M47816 Spondylosis without myelopathy or radiculopathy, lumbar region: Secondary | ICD-10-CM | POA: Diagnosis not present

## 2019-11-26 DIAGNOSIS — I313 Pericardial effusion (noninflammatory): Secondary | ICD-10-CM | POA: Diagnosis not present

## 2019-11-26 DIAGNOSIS — C3490 Malignant neoplasm of unspecified part of unspecified bronchus or lung: Secondary | ICD-10-CM | POA: Diagnosis not present

## 2019-11-29 ENCOUNTER — Inpatient Hospital Stay: Payer: Medicare Other

## 2019-11-29 ENCOUNTER — Other Ambulatory Visit: Payer: Self-pay | Admitting: Internal Medicine

## 2019-11-29 ENCOUNTER — Encounter: Payer: Self-pay | Admitting: Internal Medicine

## 2019-11-29 ENCOUNTER — Inpatient Hospital Stay: Payer: Medicare Other | Attending: Physician Assistant | Admitting: Internal Medicine

## 2019-11-29 ENCOUNTER — Other Ambulatory Visit: Payer: Self-pay

## 2019-11-29 VITALS — BP 145/68 | HR 61 | Ht 61.0 in | Wt 176.6 lb

## 2019-11-29 DIAGNOSIS — Z5112 Encounter for antineoplastic immunotherapy: Secondary | ICD-10-CM | POA: Diagnosis not present

## 2019-11-29 DIAGNOSIS — C3491 Malignant neoplasm of unspecified part of right bronchus or lung: Secondary | ICD-10-CM | POA: Diagnosis not present

## 2019-11-29 DIAGNOSIS — Z79899 Other long term (current) drug therapy: Secondary | ICD-10-CM | POA: Diagnosis not present

## 2019-11-29 DIAGNOSIS — I10 Essential (primary) hypertension: Secondary | ICD-10-CM

## 2019-11-29 DIAGNOSIS — C7931 Secondary malignant neoplasm of brain: Secondary | ICD-10-CM | POA: Diagnosis not present

## 2019-11-29 DIAGNOSIS — Z95828 Presence of other vascular implants and grafts: Secondary | ICD-10-CM

## 2019-11-29 DIAGNOSIS — C3412 Malignant neoplasm of upper lobe, left bronchus or lung: Secondary | ICD-10-CM | POA: Insufficient documentation

## 2019-11-29 DIAGNOSIS — I25118 Atherosclerotic heart disease of native coronary artery with other forms of angina pectoris: Secondary | ICD-10-CM

## 2019-11-29 DIAGNOSIS — R5383 Other fatigue: Secondary | ICD-10-CM

## 2019-11-29 DIAGNOSIS — C3492 Malignant neoplasm of unspecified part of left bronchus or lung: Secondary | ICD-10-CM

## 2019-11-29 LAB — CBC WITH DIFFERENTIAL (CANCER CENTER ONLY)
Abs Immature Granulocytes: 0.01 10*3/uL (ref 0.00–0.07)
Basophils Absolute: 0 10*3/uL (ref 0.0–0.1)
Basophils Relative: 1 %
Eosinophils Absolute: 0.5 10*3/uL (ref 0.0–0.5)
Eosinophils Relative: 8 %
HCT: 34.1 % — ABNORMAL LOW (ref 36.0–46.0)
Hemoglobin: 10.4 g/dL — ABNORMAL LOW (ref 12.0–15.0)
Immature Granulocytes: 0 %
Lymphocytes Relative: 21 %
Lymphs Abs: 1.4 10*3/uL (ref 0.7–4.0)
MCH: 24.5 pg — ABNORMAL LOW (ref 26.0–34.0)
MCHC: 30.5 g/dL (ref 30.0–36.0)
MCV: 80.4 fL (ref 80.0–100.0)
Monocytes Absolute: 0.6 10*3/uL (ref 0.1–1.0)
Monocytes Relative: 10 %
Neutro Abs: 4 10*3/uL (ref 1.7–7.7)
Neutrophils Relative %: 60 %
Platelet Count: 244 10*3/uL (ref 150–400)
RBC: 4.24 MIL/uL (ref 3.87–5.11)
RDW: 15.9 % — ABNORMAL HIGH (ref 11.5–15.5)
WBC Count: 6.5 10*3/uL (ref 4.0–10.5)
nRBC: 0 % (ref 0.0–0.2)

## 2019-11-29 LAB — CMP (CANCER CENTER ONLY)
ALT: 10 U/L (ref 0–44)
AST: 17 U/L (ref 15–41)
Albumin: 3.4 g/dL — ABNORMAL LOW (ref 3.5–5.0)
Alkaline Phosphatase: 72 U/L (ref 38–126)
Anion gap: 9 (ref 5–15)
BUN: 19 mg/dL (ref 8–23)
CO2: 28 mmol/L (ref 22–32)
Calcium: 9.2 mg/dL (ref 8.9–10.3)
Chloride: 105 mmol/L (ref 98–111)
Creatinine: 1.78 mg/dL — ABNORMAL HIGH (ref 0.44–1.00)
GFR, Estimated: 31 mL/min — ABNORMAL LOW (ref >60)
Glucose, Bld: 138 mg/dL — ABNORMAL HIGH (ref 70–99)
Potassium: 3.8 mmol/L (ref 3.5–5.1)
Sodium: 142 mmol/L (ref 135–145)
Total Bilirubin: 0.3 mg/dL (ref 0.3–1.2)
Total Protein: 7 g/dL (ref 6.5–8.1)

## 2019-11-29 LAB — TSH: TSH: 1.624 u[IU]/mL (ref 0.308–3.960)

## 2019-11-29 MED ORDER — SODIUM CHLORIDE 0.9% FLUSH
10.0000 mL | INTRAVENOUS | Status: DC | PRN
  Administered 2019-11-29: 10 mL
  Filled 2019-11-29: qty 10

## 2019-11-29 MED ORDER — SODIUM CHLORIDE 0.9 % IV SOLN
Freq: Once | INTRAVENOUS | Status: AC
  Filled 2019-11-29: qty 250

## 2019-11-29 MED ORDER — HEPARIN SOD (PORK) LOCK FLUSH 100 UNIT/ML IV SOLN
500.0000 [IU] | Freq: Once | INTRAVENOUS | Status: AC | PRN
  Administered 2019-11-29: 500 [IU]
  Filled 2019-11-29: qty 5

## 2019-11-29 MED ORDER — SODIUM CHLORIDE 0.9 % IV SOLN
200.0000 mg | Freq: Once | INTRAVENOUS | Status: AC
  Administered 2019-11-29: 200 mg via INTRAVENOUS
  Filled 2019-11-29: qty 8

## 2019-11-29 NOTE — Patient Instructions (Signed)

## 2019-11-29 NOTE — Patient Instructions (Signed)
[facility] Discharge Instructions for Patients Receiving Chemotherapy  Today you received the following chemotherapy agents: pembrolizumab.  To help prevent nausea and vomiting after your treatment, we encourage you to take your nausea medication as directed.   If you develop nausea and vomiting that is not controlled by your nausea medication, call the clinic.   BELOW ARE SYMPTOMS THAT SHOULD BE REPORTED IMMEDIATELY:  *FEVER GREATER THAN 100.5 F  *CHILLS WITH OR WITHOUT FEVER  NAUSEA AND VOMITING THAT IS NOT CONTROLLED WITH YOUR NAUSEA MEDICATION  *UNUSUAL SHORTNESS OF BREATH  *UNUSUAL BRUISING OR BLEEDING  TENDERNESS IN MOUTH AND THROAT WITH OR WITHOUT PRESENCE OF ULCERS  *URINARY PROBLEMS  *BOWEL PROBLEMS  UNUSUAL RASH Items with * indicate a potential emergency and should be followed up as soon as possible.  Feel free to call the clinic should you have any questions or concerns. The clinic phone number is [phone].  Please show the CHEMO ALERT CARD at check-in to the Emergency Department and triage nurse.   

## 2019-11-29 NOTE — Progress Notes (Signed)
Per Dr Julien Nordmann, ok to treat with creatinine 1.78

## 2019-11-29 NOTE — Progress Notes (Signed)
[place] [phone]   [phone]  OFFICE PROGRESS NOTE  [name], MD [address] [place] [address]  DIAGNOSIS: Stage IV (T1c, N1, M1c ) non-small cell lung cancer, adenocarcinoma diagnosed in July 2020 and presented with left upper lobe lung nodule in addition to right upper lobe lung nodule with left hilar lymphadenopathy as well as multiple brain metastasis.  Biomarker Findings Microsatellite status - MS-Stable Tumor Mutational Burden - 4 Muts/Mb Genomic Findings For a complete list of the genes assayed, please refer to the Appendix. ATM G204* KRAS G12C PDGFRA P122T SMO R199W 7 Disease relevant genes with no reportable alterations: ALK, BRAF, EGFR, ERBB2, MET, RET, ROS1  PDL 1 expression was 1%  PRIOR THERAPY: SBRT to multiple brain metastasis under the care of Dr. Isidore Moos.  CURRENT THERAPY: Systemic chemotherapy with carboplatin for AUC of 5, Alimta 500 mg/M2 and Keytruda 200 mg IV every 3 weeks.  First dose October 05, 2018.  Status post 20 cycles.  Starting from cycle #5 the patient will be on treatment with maintenance Alimta 500 mg/M2 and Keytruda 200 mg IV every 3 weeks.  She has been on treatment with single agent Keytruda recently secondary to renal insufficiency.  INTERVAL HISTORY: Carolyn [name] 64 y.o. female returns to the clinic today for follow-up visit.  The patient is feeling fine today with no concerning complaints except for mild fatigue.  She denied having any current chest pain, shortness of breath, cough or hemoptysis.  She denied having any nausea, vomiting, diarrhea or constipation.  She has no headache or visual changes.  She denied having any significant weight loss or night sweats.  The patient continues to tolerate her treatment with [facility] fairly well.  She has not received Alimta for the last few cycles because of her renal insufficiency.  She had repeat CT scan of the chest, abdomen  pelvis performed recently and she is here for evaluation and discussion of her risk her results.   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  MEDICATIONS:  Current Outpatient Medications  Medication Sig Dispense Refill   acetaminophen (TYLENOL) 500 MG tablet Take 1,000 mg by mouth every 8 (eight) hours as needed for mild pain, fever or headache.      amLODipine (NORVASC) 5 MG tablet Take 1 tablet by mouth once daily 90 tablet 2   aspirin EC 81 MG tablet Take 81 mg by mouth daily.     carvedilol (COREG) 25 MG tablet Take 1/2 (one-half) tablet by mouth twice daily 180 tablet 0   cetirizine (ZYRTEC) 10 MG tablet Take 10 mg by mouth daily.      Cholecalciferol (  VITAMIN D-3) 125 MCG (5000 UT) TABS Take 5,000 Units by mouth daily.     Chromium Picolinate 1000 MCG TABS Take 1,000 mg by mouth daily.     famotidine (PEPCID) 20 MG tablet Take 20 mg by mouth 2 (two) times daily.     Lancets (ONETOUCH DELICA PLUS LANCET33G) MISC SMARTSIG:1 Topical 3 Times Daily     lidocaine-prilocaine (EMLA) cream Apply to the Port-A-Cath site 30 to 60 minutes before chemotherapy. 30 g 2   metFORMIN (GLUCOPHAGE) 500 MG tablet Take 500 mg by mouth 2 (two) times daily with a meal.      methocarbamol (ROBAXIN) 500 MG tablet Take 1 tablet (500 mg total) by mouth every 6 (six) hours as needed for muscle spasms. (Patient not taking:  Reported on 11/12/2019) 21 tablet 0   nitroGLYCERIN (NITROSTAT) 0.4 MG SL tablet Place 1 tablet (0.4 mg total) under the tongue every 5 (five) minutes as needed for chest pain. 25 tablet 2   ONETOUCH ULTRA test strip USE 1 STRIP TO CHECK GLUCOSE THREE TIMES DAILY     Pitavastatin Calcium (LIVALO) 4 MG TABS Take 4 mg by mouth at bedtime.      Polyethyl Glycol-Propyl Glycol (SYSTANE) 0.4-0.3 % SOLN Place 1 drop into both eyes every 6 (six) hours as needed (dry eyes).      prochlorperazine (COMPAZINE) 10 MG tablet Take 1 tablet (10 mg total) by mouth every 6 (six) hours as needed for nausea or vomiting. (Patient not taking: Reported on 11/12/2019) 30 tablet 2   No current facility-administered medications for this visit.    SURGICAL HISTORY:  Past Surgical History:  Procedure Laterality Date   ABDOMINAL HYSTERECTOMY     bone spur     CARDIAC CATHETERIZATION  06/13/2012   carpel tunnel surgery Left    COLONOSCOPY     9 years ago   CORONARY ANGIOPLASTY  06/13/2012   EYE SURGERY     cyst left eye    IR IMAGING GUIDED PORT INSERTION  10/10/2018   LEFT HEART CATHETERIZATION WITH CORONARY ANGIOGRAM N/A 06/13/2012   Procedure: LEFT HEART CATHETERIZATION WITH CORONARY ANGIOGRAM;  Surgeon: Pamella Pert, MD;  Location: [facility] CATH LAB;  Service: Cardiovascular;  Laterality: N/A;   NECK SURGERY     rotator cuff surgery Right 2015   VIDEO BRONCHOSCOPY WITH ENDOBRONCHIAL NAVIGATION N/A 09/06/2018   Procedure: VIDEO BRONCHOSCOPY WITH ENDOBRONCHIAL NAVIGATION, left upper lung;  Surgeon: Corliss Skains, MD;  Location: [facility] OR;  Service: Thoracic;  Laterality: N/A;   VIDEO BRONCHOSCOPY WITH ENDOBRONCHIAL ULTRASOUND Left 09/06/2018   Procedure: VIDEO BRONCHOSCOPY WITH ENDOBRONCHIAL ULTRASOUND, left lung;  Surgeon: Corliss Skains, MD;  Location: [facility] OR;  Service: Thoracic;  Laterality: Left;   WISDOM TOOTH EXTRACTION      REVIEW OF SYSTEMS:  Constitutional: positive for fatigue Eyes:  negative Ears, nose, mouth, throat, and face: negative Respiratory: negative Cardiovascular: negative Gastrointestinal: negative Genitourinary:negative Integument/breast: negative Hematologic/lymphatic: negative Musculoskeletal:negative Neurological: negative Behavioral/Psych: negative Endocrine: negative Allergic/Immunologic: negative   PHYSICAL EXAMINATION: General appearance: alert, cooperative, fatigued and no distress Head: Normocephalic, without obvious abnormality, atraumatic Neck: no adenopathy, no JVD, supple, symmetrical, trachea midline and thyroid not enlarged, symmetric, no tenderness/mass/nodules Lymph nodes: Cervical, supraclavicular, and axillary nodes normal. Resp: clear to auscultation bilaterally Back: symmetric, no curvature. ROM normal. No CVA tenderness. Cardio: regular rate and rhythm, S1, S2 normal, no murmur, click, rub or gallop GI: soft, non-tender; bowel sounds normal; no masses,  no organomegaly Extremities: extremities normal, atraumatic, no  cyanosis or edema Neurologic: Alert and oriented X 3, normal strength and tone. Normal symmetric reflexes. Normal coordination and gait  ECOG PERFORMANCE STATUS: 1 - Symptomatic but completely ambulatory  Blood pressure (!) 145/68, pulse 61, height $RemoveBe'5\' 1"'YYUYiGbLm$  (1.549 m), weight 176 lb 9.6 oz (80.1 kg).  LABORATORY DATA: Lab Results  Component Value Date   WBC 6.5 11/29/2019   HGB 10.4 (L) 11/29/2019   HCT 34.1 (L) 11/29/2019   MCV 80.4 11/29/2019   PLT 244 11/29/2019      Chemistry      Component Value Date/Time   NA 140 11/12/2019 0917   K 4.0 11/12/2019 0917   CL 102 11/12/2019 0917   CO2 28 11/12/2019 0917   BUN 16 11/12/2019 0917   CREATININE 1.64 (H) 11/12/2019 0917   CREATININE 1.67 (H) 11/08/2019 0900      Component Value Date/Time   CALCIUM 9.2 11/12/2019 0917   ALKPHOS 74 11/08/2019 0900   AST 19 11/08/2019 0900   ALT 12 11/08/2019 0900   BILITOT 0.3 11/08/2019 0900       RADIOGRAPHIC  STUDIES: CT Abdomen Pelvis Wo Contrast  Result Date: 11/26/2019 CLINICAL DATA:  Non-small cell lung cancer. Assess treatment response EXAM: CT CHEST, ABDOMEN AND PELVIS WITHOUT CONTRAST TECHNIQUE: Multidetector CT imaging of the chest, abdomen and pelvis was performed following the standard protocol without IV contrast. COMPARISON:  09/04/2019. FINDINGS: CT CHEST FINDINGS Cardiovascular: Coronary artery calcification and aortic atherosclerotic calcification. Small pericardial effusion unchanged. Mediastinum/Nodes: Port in the anterior chest wall with tip in distal SVC. No axillary or supraclavicular adenopathy. No mediastinal or hilar adenopathy. Esophagus normal. Lungs/Pleura: Nodular thickening in the LEFT upper lobe measuring 7 mm is unchanged from 8 mm on comparison. Nodular pleural thickening in the RIGHT upper lobe measuring 8 mm also unchanged. Small cavitary nodule in the superior segment of the LEFT lower lobe measuring 2 mm (image 54/6 is not changed from 3 mm. Musculoskeletal: No aggressive osseous lesion. CT ABDOMEN AND PELVIS FINDINGS Hepatobiliary: No focal hepatic lesion. No biliary ductal dilatation. Gallbladder is normal. Common bile duct is normal. Pancreas: Pancreas is normal. No ductal dilatation. No pancreatic inflammation. Spleen: Normal spleen Adrenals/urinary tract: Adrenal glands and kidneys are normal. The ureters and bladder normal. Stomach/Bowel: Stomach, small bowel, appendix, and cecum are normal. Periampullary duodenum diverticulum again noted. The colon and rectosigmoid colon are normal. Vascular/Lymphatic: Abdominal aorta is normal caliber with atherosclerotic calcification. There is no retroperitoneal or periportal lymphadenopathy. No pelvic lymphadenopathy. Reproductive: Post hysterectomy.  Adnexa unremarkable Other: No free fluid. Musculoskeletal: No aggressive osseous lesion. Chronic anterolisthesis of L3 on L4. IMPRESSION: Chest Impression: 1. Stable nodular thickening in  the LEFT upper lobe. Additional smaller nodules in the LEFT lower lobe and RIGHT upper lobe are also unchanged. No new pulmonary nodularity. 2. No mediastinal lymphadenopathy. 3. Stable small pericardial effusion. 4. Coronary artery calcification and Aortic Atherosclerosis (ICD10-I70.0). Abdomen / Pelvis Impression: 1. No evidence of metastatic disease in the abdomen pelvis. 2. Stable degenerative change of the lumbar spine. Electronically Signed   By: Suzy Bouchard M.D.   On: 11/26/2019 08:24   DG Chest 2 View  Result Date: 11/12/2019 CLINICAL DATA:  Chest pain. EXAM: CHEST - 2 VIEW COMPARISON:  September 04, 2018. FINDINGS: The heart size and mediastinal contours are within normal limits. No pneumothorax or pleural effusion is noted. Right internal jugular Port-A-Cath is noted. Left upper lobe mass noted on prior exam is not well visualized currently. No definite acute abnormality is noted.  The visualized skeletal structures are unremarkable. IMPRESSION: No active cardiopulmonary disease. Left upper lobe mass noted on prior exam is not well visualized currently. Electronically Signed   By: Marijo Conception M.D.   On: 11/12/2019 09:58   CT Chest Wo Contrast  Result Date: 11/26/2019 CLINICAL DATA:  Non-small cell lung cancer. Assess treatment response EXAM: CT CHEST, ABDOMEN AND PELVIS WITHOUT CONTRAST TECHNIQUE: Multidetector CT imaging of the chest, abdomen and pelvis was performed following the standard protocol without IV contrast. COMPARISON:  09/04/2019. FINDINGS: CT CHEST FINDINGS Cardiovascular: Coronary artery calcification and aortic atherosclerotic calcification. Small pericardial effusion unchanged. Mediastinum/Nodes: Port in the anterior chest wall with tip in distal SVC. No axillary or supraclavicular adenopathy. No mediastinal or hilar adenopathy. Esophagus normal. Lungs/Pleura: Nodular thickening in the LEFT upper lobe measuring 7 mm is unchanged from 8 mm on comparison. Nodular pleural  thickening in the RIGHT upper lobe measuring 8 mm also unchanged. Small cavitary nodule in the superior segment of the LEFT lower lobe measuring 2 mm (image 54/6 is not changed from 3 mm. Musculoskeletal: No aggressive osseous lesion. CT ABDOMEN AND PELVIS FINDINGS Hepatobiliary: No focal hepatic lesion. No biliary ductal dilatation. Gallbladder is normal. Common bile duct is normal. Pancreas: Pancreas is normal. No ductal dilatation. No pancreatic inflammation. Spleen: Normal spleen Adrenals/urinary tract: Adrenal glands and kidneys are normal. The ureters and bladder normal. Stomach/Bowel: Stomach, small bowel, appendix, and cecum are normal. Periampullary duodenum diverticulum again noted. The colon and rectosigmoid colon are normal. Vascular/Lymphatic: Abdominal aorta is normal caliber with atherosclerotic calcification. There is no retroperitoneal or periportal lymphadenopathy. No pelvic lymphadenopathy. Reproductive: Post hysterectomy.  Adnexa unremarkable Other: No free fluid. Musculoskeletal: No aggressive osseous lesion. Chronic anterolisthesis of L3 on L4. IMPRESSION: Chest Impression: 1. Stable nodular thickening in the LEFT upper lobe. Additional smaller nodules in the LEFT lower lobe and RIGHT upper lobe are also unchanged. No new pulmonary nodularity. 2. No mediastinal lymphadenopathy. 3. Stable small pericardial effusion. 4. Coronary artery calcification and Aortic Atherosclerosis (ICD10-I70.0). Abdomen / Pelvis Impression: 1. No evidence of metastatic disease in the abdomen pelvis. 2. Stable degenerative change of the lumbar spine. Electronically Signed   By: Suzy Bouchard M.D.   On: 11/26/2019 08:24    ASSESSMENT AND PLAN: This is a very pleasant 64 years old African-American female with likely stage IV (T1c, N1, M1C) non-small cell lung cancer, adenocarcinoma with K-ras G12C mutation presented with left upper lobe lung nodule in addition to right upper lobe pulmonary nodule as well as left  hilar adenopathy and metastatic lesion to the brain. Molecular studies showed no actionable mutation and PDL 1 expression was 1%. The patient underwent SBRT to the metastatic brain lesion under the care of Dr. Isidore Moos. The patient is currently undergoing systemic chemotherapy with carboplatin for AUC of 5, Alimta 500 mg/M2 and Keytruda 200 mg IV every 3 weeks is status post 20 cycles.  Starting from cycle #5 she is on maintenance treatment with Alimta and Keytruda every 3 weeks with only Keytruda on the last few cycles because of the renal insufficiency. The patient tolerated the last few cycle with single agent Keytruda fairly well. She had repeat CT scan of the chest, abdomen pelvis performed recently.  I personally and independently reviewed the scans and discussed the results with the patient today. Her scan showed no concerning findings for disease progression. I recommended for her to continue with her current treatment with single agent Keytruda for now. I will see her back for  follow-up visit in 3 weeks for evaluation before the next cycle of her treatment. For the renal insufficiency she is currently followed by nephrology. She was advised to call immediately if she has any concerning symptoms in the interval. The patient voices understanding of current disease status and treatment options and is in agreement with the current care plan. All questions were answered. The patient knows to call the clinic with any problems, questions or concerns. We can certainly see the patient much sooner if necessary.  Disclaimer: This note was dictated with voice recognition software. Similar sounding words can inadvertently be transcribed and may not be corrected upon review.

## 2019-12-17 NOTE — Progress Notes (Signed)
[facility] OFFICE PROGRESS NOTE  Pearson Grippe, MD [address] [place] [address]  DIAGNOSIS: Stage IV(T1c, N1, M1 C)non-small cell lung cancer, adenocarcinoma. Shepresented with left upper lobe lung nodule in addition to right upper lobe lung nodule with left hilar lymphadenopathy as well as multiple brain metastasis.She was diagnosed in July of 2020  Biomarker Findings Microsatellite status - MS-Stable Tumor Mutational Burden - 4 Muts/Mb Genomic Findings For a complete list of the genes assayed, please refer to the Appendix. ATM G204* KRAS G12C PDGFRA P122T SMO R199W 7 Disease relevant genes with no reportable alterations: ALK, BRAF, EGFR, ERBB2, MET, RET, ROS1  PDL 1 expressionwas 1%  PRIOR THERAPY: SBRT to multiple brain metastasis under the care of Dr. Basilio Cairo.Completed in August 2020  CURRENT THERAPY: Systemic chemotherapy with Carboplatin for an AUC of 5, Alimta 500 mg/m2, and Keytruda 200 mg IV every 3 weeks. Status post21 cyclesof treatment. First dose on 10/05/2018.She started maintenance Keytruda and Alimta starting from cycle #5. Alimta was discontinued due to renal insufficiency.  INTERVAL HISTORY: Carolyn [name] 64 y.o. female returns to the clinic today for a follow up visit. The patient is feeling well today without any concerning complaints except heartburn. She took nexium in the past with relief but this was discontinued by her PCP and switched to pepcid. She does not have adequate relief with pepcid. She was reportedly instructed to check with her nephrologist to see if she can take a PPI again a few days a week again to help control her symptoms. She also has some joint pain in one of the fingers in her right hand and is wondering if she is able to get a joint injection. The patient continues to tolerate treatment with single agent Keytrudawell without any adverse sideeffects. Alimta was discontinued due to renal  insufficiency and she follows with nephrology. Denies any fever, chills, night sweats, or weight loss. Denies any chest pain, shortness of breath, cough, or hemoptysis. Denies any nausea, vomiting, constipation, or diarrhea.  She denies rashes or skin changes. She denies any headaches or visual changes but reports a few seconds of having an abnormal head/scalp sensation when she stands up. It happens "once in a blue moon" and she estimates that it occurs 1-2x per week. Denies changes in her balance. She follows closely with Dr. Barbaraann Cao, neuro-oncologist, for her history of brain metastases. Her next MRI is scheduled for January 2022. She is here for evaluation before starting cycle #22  MEDICAL HISTORY: Past Medical History:  Diagnosis Date  . Anginal pain (HCC)    " with exertion "  . Arthritis    " 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 "  . Asthma    h/o  . Coronary artery disease   . Diabetes mellitus without complication (HCC)    Type II  . GERD (gastroesophageal reflux disease)   . H/O hiatal hernia   . Heart murmur   . History of radiation therapy 09/22/2018   stereotactic radiosurgery   . 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 Hypertension   . nscl ca dx'd 08/07/18  . Persistent headaches    h/o  . Pneumonia    hx of PNA  . Shortness of breath   . Vaginal dryness    h/o    ALLERGIES:  is allergic to lisinopril.  MEDICATIONS:  Current Outpatient Medications  Medication Sig Dispense Refill  . acetaminophen (TYLENOL) 500 MG tablet Take 1,000 mg by mouth every 8 (eight)  hours as needed for mild pain, fever or headache.     Marland Kitchen amLODipine (NORVASC) 5 MG tablet Take 1 tablet by mouth once daily 90 tablet 2  . aspirin EC 81 MG tablet Take 81 mg by mouth daily.    . carvedilol (COREG) 25 MG tablet Take 1/2 (one-half) tablet by mouth twice daily 180 tablet 0  . cetirizine (ZYRTEC) 10 MG tablet Take 10 mg by mouth daily.     . Cholecalciferol (VITAMIN D-3) 125 MCG (5000 UT) TABS Take 5,000 Units by mouth daily.    .  Chromium Picolinate 1000 MCG TABS Take 1,000 mg by mouth daily.    . famotidine (PEPCID) 20 MG tablet Take 20 mg by mouth 2 (two) times daily.    . Lancets (ONETOUCH DELICA PLUS LANCET33G) MISC SMARTSIG:1 Topical 3 Times Daily    . lidocaine-prilocaine (EMLA) cream Apply to the Port-A-Cath site 30 to 60 minutes before chemotherapy. 30 g 2  . metFORMIN (GLUCOPHAGE) 500 MG tablet Take 500 mg by mouth 2 (two) times daily with a meal.     . methocarbamol (ROBAXIN) 500 MG tablet Take 1 tablet (500 mg total) by mouth every 6 (six) hours as needed for muscle spasms. (Patient not taking: Reported on 11/12/2019) 21 tablet 0  . nitroGLYCERIN (NITROSTAT) 0.4 MG SL tablet Place 1 tablet (0.4 mg total) under the tongue every 5 (five) minutes as needed for chest pain. 25 tablet 2  . ONETOUCH ULTRA test strip USE 1 STRIP TO CHECK GLUCOSE THREE TIMES DAILY    . Pitavastatin Calcium (LIVALO) 4 MG TABS Take 4 mg by mouth at bedtime.     Bertram Gala Glycol-Propyl Glycol (SYSTANE) 0.4-0.3 % SOLN Place 1 drop into both eyes every 6 (six) hours as needed (dry eyes).     . prochlorperazine (COMPAZINE) 10 MG tablet Take 1 tablet (10 mg total) by mouth every 6 (six) hours as needed for nausea or vomiting. (Patient not taking: Reported on 11/12/2019) 30 tablet 2   No current facility-administered medications for this visit.    SURGICAL HISTORY:  Past Surgical History:  Procedure Laterality Date  . ABDOMINAL HYSTERECTOMY    . bone spur    . CARDIAC CATHETERIZATION  06/13/2012  . carpel tunnel surgery Left   . COLONOSCOPY     9 years ago  . CORONARY ANGIOPLASTY  06/13/2012  . EYE SURGERY     cyst left eye   . IR IMAGING GUIDED PORT INSERTION  10/10/2018  . LEFT HEART CATHETERIZATION WITH CORONARY ANGIOGRAM N/A 06/13/2012   Procedure: LEFT HEART CATHETERIZATION WITH CORONARY ANGIOGRAM;  Surgeon: Pamella Pert, MD;  Location[facility] C[facility] LAB;  Service: Cardiovascular;  Laterality: N/A;  . NECK SURGERY    . rotator cuff  surgery Right 2015  . VIDEO BRONCHOSCOPY WITH ENDOBRONCHIAL NAVIGATION N/A 09/06/2018   Procedure: VIDEO BRONCHOSCOPY WITH ENDOBRONCHIAL NAVIGATION, left upper lung;  Surgeon: Corliss Skains, MD;  Location: [facility] OR;  Service: Thoracic;  Laterality: N/A;  . VIDEO BRONCHOSCOPY WITH ENDOBRONCHIAL ULTRASOUND Left 09/06/2018   Procedure: VIDEO BRONCHOSCOPY WITH ENDOBRONCHIAL ULTRASOUND, left lung;  Surgeon: Corliss Skains, MD;  Location: [facility] OR;  Service: Thoracic;  Laterality: Left;  . WISDOM TOOTH EXTRACTION      REVIEW OF SYSTEMS:   Review of Systems  Constitutional: Negative for appetite change, chills, fatigue, fever and unexpected weight change.  HENT: Negative for mouth sores, nosebleeds, sore throat and trouble swallowing.   Eyes: Negative for eye problems and icterus.  Respiratory: Negative for cough, hemoptysis, shortness of breath and wheezing.   Cardiovascular: Negative for chest pain and leg swelling.  Gastrointestinal: Negative for abdominal pain, constipation, diarrhea, nausea and vomiting.  Genitourinary: Negative for bladder incontinence, difficulty urinating, dysuria, frequency and hematuria.   Musculoskeletal: Negative for back pain, gait problem, neck pain and neck stiffness.  Skin: Negative for itching and rash.  Neurological: Positive for mild lightheadedness. Negative for dizziness, extremity weakness, gait problem, headaches, and seizures.  Hematological: Negative for adenopathy. Does not bruise/bleed easily.  Psychiatric/Behavioral: Negative for confusion, depression and sleep disturbance. The patient is not nervous/anxious.     PHYSICAL EXAMINATION:  There were no vitals taken for this visit.  ECOG PERFORMANCE STATUS: 1 - Symptomatic but completely ambulatory  Physical Exam  Constitutional: Oriented to person, place, and time and well-developed, well-nourished, and in no distress. HENT:  Head: Normocephalic and atraumatic.  Mouth/Throat: Oropharynx is  clear and moist. No oropharyngeal exudate.  Eyes: Conjunctivae are normal. Right eye exhibits no discharge. Left eye exhibits no discharge. No scleral icterus.  Neck: Normal range of motion. Neck supple.  Cardiovascular: Normal rate, regular rhythm, normal heart sounds and intact distal pulses.   Pulmonary/Chest: Effort normal and breath sounds normal. No respiratory distress. No wheezes. No rales.  Abdominal: Soft. Bowel sounds are normal. Exhibits no distension and no mass. There is no tenderness.  Musculoskeletal: Normal range of motion. Exhibits no edema.  Lymphadenopathy:    No cervical adenopathy.  Neurological: Alert and oriented to person, place, and time. Exhibits normal muscle tone. Gait normal. Coordination normal.  Skin: Skin is warm and dry. No rash noted. Not diaphoretic. No erythema. No pallor.  Psychiatric: Mood, memory and judgment normal.  Vitals reviewed.  LABORATORY DATA: Lab Results  Component Value Date   WBC 6.5 11/29/2019   HGB 10.4 (L) 11/29/2019   HCT 34.1 (L) 11/29/2019   MCV 80.4 11/29/2019   PLT 244 11/29/2019      Chemistry      Component Value Date/Time   NA 142 11/29/2019 0910   K 3.8 11/29/2019 0910   CL 105 11/29/2019 0910   CO2 28 11/29/2019 0910   BUN 19 11/29/2019 0910   CREATININE 1.78 (H) 11/29/2019 0910      Component Value Date/Time   CALCIUM 9.2 11/29/2019 0910   ALKPHOS 72 11/29/2019 0910   AST 17 11/29/2019 0910   ALT 10 11/29/2019 0910   BILITOT 0.3 11/29/2019 0910       RADIOGRAPHIC STUDIES:  CT Abdomen Pelvis Wo Contrast  Result Date: 11/26/2019 CLINICAL DATA:  Non-small cell lung cancer. Assess treatment response EXAM: CT CHEST, ABDOMEN AND PELVIS WITHOUT CONTRAST TECHNIQUE: Multidetector CT imaging of the chest, abdomen and pelvis was performed following the standard protocol without IV contrast. COMPARISON:  09/04/2019. FINDINGS: CT CHEST FINDINGS Cardiovascular: Coronary artery calcification and aortic atherosclerotic  calcification. Small pericardial effusion unchanged. Mediastinum/Nodes: Port in the anterior chest wall with tip in distal SVC. No axillary or supraclavicular adenopathy. No mediastinal or hilar adenopathy. Esophagus normal. Lungs/Pleura: Nodular thickening in the LEFT upper lobe measuring 7 mm is unchanged from 8 mm on comparison. Nodular pleural thickening in the RIGHT upper lobe measuring 8 mm also unchanged. Small cavitary nodule in the superior segment of the LEFT lower lobe measuring 2 mm (image 54/6 is not changed from 3 mm. Musculoskeletal: No aggressive osseous lesion. CT ABDOMEN AND PELVIS FINDINGS Hepatobiliary: No focal hepatic lesion. No biliary ductal dilatation. Gallbladder is normal. Common bile duct is  normal. Pancreas: Pancreas is normal. No ductal dilatation. No pancreatic inflammation. Spleen: Normal spleen Adrenals/urinary tract: Adrenal glands and kidneys are normal. The ureters and bladder normal. Stomach/Bowel: Stomach, small bowel, appendix, and cecum are normal. Periampullary duodenum diverticulum again noted. The colon and rectosigmoid colon are normal. Vascular/Lymphatic: Abdominal aorta is normal caliber with atherosclerotic calcification. There is no retroperitoneal or periportal lymphadenopathy. No pelvic lymphadenopathy. Reproductive: Post hysterectomy.  Adnexa unremarkable Other: No free fluid. Musculoskeletal: No aggressive osseous lesion. Chronic anterolisthesis of L3 on L4. IMPRESSION: Chest Impression: 1. Stable nodular thickening in the LEFT upper lobe. Additional smaller nodules in the LEFT lower lobe and RIGHT upper lobe are also unchanged. No new pulmonary nodularity. 2. No mediastinal lymphadenopathy. 3. Stable small pericardial effusion. 4. Coronary artery calcification and Aortic Atherosclerosis (ICD10-I70.0). Abdomen / Pelvis Impression: 1. No evidence of metastatic disease in the abdomen pelvis. 2. Stable degenerative change of the lumbar spine. Electronically Signed    By: Suzy Bouchard M.D.   On: 11/26/2019 08:24   CT Chest Wo Contrast  Result Date: 11/26/2019 CLINICAL DATA:  Non-small cell lung cancer. Assess treatment response EXAM: CT CHEST, ABDOMEN AND PELVIS WITHOUT CONTRAST TECHNIQUE: Multidetector CT imaging of the chest, abdomen and pelvis was performed following the standard protocol without IV contrast. COMPARISON:  09/04/2019. FINDINGS: CT CHEST FINDINGS Cardiovascular: Coronary artery calcification and aortic atherosclerotic calcification. Small pericardial effusion unchanged. Mediastinum/Nodes: Port in the anterior chest wall with tip in distal SVC. No axillary or supraclavicular adenopathy. No mediastinal or hilar adenopathy. Esophagus normal. Lungs/Pleura: Nodular thickening in the LEFT upper lobe measuring 7 mm is unchanged from 8 mm on comparison. Nodular pleural thickening in the RIGHT upper lobe measuring 8 mm also unchanged. Small cavitary nodule in the superior segment of the LEFT lower lobe measuring 2 mm (image 54/6 is not changed from 3 mm. Musculoskeletal: No aggressive osseous lesion. CT ABDOMEN AND PELVIS FINDINGS Hepatobiliary: No focal hepatic lesion. No biliary ductal dilatation. Gallbladder is normal. Common bile duct is normal. Pancreas: Pancreas is normal. No ductal dilatation. No pancreatic inflammation. Spleen: Normal spleen Adrenals/urinary tract: Adrenal glands and kidneys are normal. The ureters and bladder normal. Stomach/Bowel: Stomach, small bowel, appendix, and cecum are normal. Periampullary duodenum diverticulum again noted. The colon and rectosigmoid colon are normal. Vascular/Lymphatic: Abdominal aorta is normal caliber with atherosclerotic calcification. There is no retroperitoneal or periportal lymphadenopathy. No pelvic lymphadenopathy. Reproductive: Post hysterectomy.  Adnexa unremarkable Other: No free fluid. Musculoskeletal: No aggressive osseous lesion. Chronic anterolisthesis of L3 on L4. IMPRESSION: Chest Impression:  1. Stable nodular thickening in the LEFT upper lobe. Additional smaller nodules in the LEFT lower lobe and RIGHT upper lobe are also unchanged. No new pulmonary nodularity. 2. No mediastinal lymphadenopathy. 3. Stable small pericardial effusion. 4. Coronary artery calcification and Aortic Atherosclerosis (ICD10-I70.0). Abdomen / Pelvis Impression: 1. No evidence of metastatic disease in the abdomen pelvis. 2. Stable degenerative change of the lumbar spine. Electronically Signed   By: Suzy Bouchard M.D.   On: 11/26/2019 08:24     ASSESSMENT/PLAN:  This is a very pleasant 64 year old African-American female with stage IV (T1c, N1,M1c)non-small celllung cancer, adenocarcinoma. She presented with a dominant left upper lobe lung nodule in addition to a right upper lobe pulmonary nodule with left hilar lymphadenopathy. She also has metastatic disease to the brain.She has no actionable mutations and her PDL1 expression is 1%. She was diagnosed in July 2020.   The patient completed SBRT to the metastatic brain lesion under the care of  Dr. Isidore Moos in August 2020.   The patient is currently undergoing systemic chemotherapy withcarboplatin for an AUC of 5, Alimta 500 mg/m, and Keytruda 200 mg IV every 3 weeks. She is status post21cyclesof treatment. Starting from cycle #5, she has been on maintenance Alimta 500 mg/m2 and Keytruda 200 mg IV.She tolerated her treatment well without anyconcerningadverse side effects.Alimta wasdiscontinued due to renal insufficiency.   Labs were reviewed. Recommend that sheproceedwith cycle #22today as scheduled.  We will see her back for follow-up visit in 3 weeks for evaluation before starting cycle #23.  For her heartburn, she is going to check with her nephrologist to see if she can take nexium again. This was reportedly discontinued in the past due to her renal insufficiency.   Regarding the brief abnormal sensation in her head, we will monitor for  now but she was instructed to call us with any new or worsening symptoms such as headaches, visual changes, increased frequency of symptoms/severity, gait changes, or balance changes to consider if further imaging is warranted.   The patient was advised to call immediately if she has any concerning symptoms in the interval. The patient voices understanding of current disease status and treatment options and is in agreement with the current care plan. All questions were answered. The patient knows to call the clinic with any problems, questions or concerns. We can certainly see the patient much sooner if necessary  No orders of the defined types were placed in this encounter.    Margurette Brener L Analiz Tvedt, PA-C 12/17/19

## 2019-12-18 DIAGNOSIS — K219 Gastro-esophageal reflux disease without esophagitis: Secondary | ICD-10-CM | POA: Diagnosis not present

## 2019-12-20 ENCOUNTER — Inpatient Hospital Stay: Payer: Medicare Other | Attending: Physician Assistant | Admitting: Physician Assistant

## 2019-12-20 ENCOUNTER — Inpatient Hospital Stay: Payer: Medicare Other

## 2019-12-20 ENCOUNTER — Other Ambulatory Visit: Payer: Self-pay

## 2019-12-20 VITALS — BP 145/64 | HR 52 | Temp 97.5°F | Resp 17 | Ht 61.0 in | Wt 177.2 lb

## 2019-12-20 DIAGNOSIS — C3412 Malignant neoplasm of upper lobe, left bronchus or lung: Secondary | ICD-10-CM | POA: Diagnosis not present

## 2019-12-20 DIAGNOSIS — Z79899 Other long term (current) drug therapy: Secondary | ICD-10-CM | POA: Diagnosis not present

## 2019-12-20 DIAGNOSIS — C3491 Malignant neoplasm of unspecified part of right bronchus or lung: Secondary | ICD-10-CM | POA: Diagnosis not present

## 2019-12-20 DIAGNOSIS — Z5112 Encounter for antineoplastic immunotherapy: Secondary | ICD-10-CM | POA: Insufficient documentation

## 2019-12-20 DIAGNOSIS — C3492 Malignant neoplasm of unspecified part of left bronchus or lung: Secondary | ICD-10-CM

## 2019-12-20 DIAGNOSIS — C7931 Secondary malignant neoplasm of brain: Secondary | ICD-10-CM | POA: Diagnosis not present

## 2019-12-20 DIAGNOSIS — Z95828 Presence of other vascular implants and grafts: Secondary | ICD-10-CM

## 2019-12-20 DIAGNOSIS — R5383 Other fatigue: Secondary | ICD-10-CM

## 2019-12-20 LAB — TSH: TSH: 1.664 u[IU]/mL (ref 0.308–3.960)

## 2019-12-20 LAB — CMP (CANCER CENTER ONLY)
ALT: 10 U/L (ref 0–44)
AST: 19 U/L (ref 15–41)
Albumin: 3.5 g/dL (ref 3.5–5.0)
Alkaline Phosphatase: 78 U/L (ref 38–126)
Anion gap: 8 (ref 5–15)
BUN: 15 mg/dL (ref 8–23)
CO2: 28 mmol/L (ref 22–32)
Calcium: 9.2 mg/dL (ref 8.9–10.3)
Chloride: 105 mmol/L (ref 98–111)
Creatinine: 1.82 mg/dL — ABNORMAL HIGH (ref 0.44–1.00)
GFR, Estimated: 30 mL/min — ABNORMAL LOW (ref >60)
Glucose, Bld: 112 mg/dL — ABNORMAL HIGH (ref 70–99)
Potassium: 4 mmol/L (ref 3.5–5.1)
Sodium: 141 mmol/L (ref 135–145)
Total Bilirubin: 0.4 mg/dL (ref 0.3–1.2)
Total Protein: 7.2 g/dL (ref 6.5–8.1)

## 2019-12-20 LAB — CBC WITH DIFFERENTIAL (CANCER CENTER ONLY)
Abs Immature Granulocytes: 0.01 10*3/uL (ref 0.00–0.07)
Basophils Absolute: 0 10*3/uL (ref 0.0–0.1)
Basophils Relative: 1 %
Eosinophils Absolute: 0.6 10*3/uL — ABNORMAL HIGH (ref 0.0–0.5)
Eosinophils Relative: 9 %
HCT: 34.5 % — ABNORMAL LOW (ref 36.0–46.0)
Hemoglobin: 10.6 g/dL — ABNORMAL LOW (ref 12.0–15.0)
Immature Granulocytes: 0 %
Lymphocytes Relative: 22 %
Lymphs Abs: 1.5 10*3/uL (ref 0.7–4.0)
MCH: 24.6 pg — ABNORMAL LOW (ref 26.0–34.0)
MCHC: 30.7 g/dL (ref 30.0–36.0)
MCV: 80 fL (ref 80.0–100.0)
Monocytes Absolute: 0.7 10*3/uL (ref 0.1–1.0)
Monocytes Relative: 10 %
Neutro Abs: 3.9 10*3/uL (ref 1.7–7.7)
Neutrophils Relative %: 58 %
Platelet Count: 255 10*3/uL (ref 150–400)
RBC: 4.31 MIL/uL (ref 3.87–5.11)
RDW: 15.9 % — ABNORMAL HIGH (ref 11.5–15.5)
WBC Count: 6.7 10*3/uL (ref 4.0–10.5)
nRBC: 0 % (ref 0.0–0.2)

## 2019-12-20 MED ORDER — SODIUM CHLORIDE 0.9% FLUSH
10.0000 mL | INTRAVENOUS | Status: DC | PRN
  Administered 2019-12-20: 3 mL
  Filled 2019-12-20: qty 10

## 2019-12-20 MED ORDER — SODIUM CHLORIDE 0.9% FLUSH
10.0000 mL | INTRAVENOUS | Status: DC | PRN
  Administered 2019-12-20: 10 mL
  Filled 2019-12-20: qty 10

## 2019-12-20 MED ORDER — HEPARIN SOD (PORK) LOCK FLUSH 100 UNIT/ML IV SOLN
500.0000 [IU] | Freq: Once | INTRAVENOUS | Status: AC | PRN
  Administered 2019-12-20: 500 [IU]
  Filled 2019-12-20: qty 5

## 2019-12-20 MED ORDER — SODIUM CHLORIDE 0.9 % IV SOLN
200.0000 mg | Freq: Once | INTRAVENOUS | Status: AC
  Administered 2019-12-20: 200 mg via INTRAVENOUS
  Filled 2019-12-20: qty 8

## 2019-12-20 MED ORDER — SODIUM CHLORIDE 0.9 % IV SOLN
Freq: Once | INTRAVENOUS | Status: AC
  Filled 2019-12-20: qty 250

## 2019-12-20 NOTE — Progress Notes (Signed)
Per Cassie Heilingoetter, PA, patient is ok to treat with scr 1.82.

## 2019-12-20 NOTE — Patient Instructions (Signed)
[facility] Discharge Instructions for Patients Receiving Chemotherapy  Today you received the following chemotherapy agents:  Keytruda.  To help prevent nausea and vomiting after your treatment, we encourage you to take your nausea medication as directed.   If you develop nausea and vomiting that is not controlled by your nausea medication, call the clinic.   BELOW ARE SYMPTOMS THAT SHOULD BE REPORTED IMMEDIATELY:  *FEVER GREATER THAN 100.5 F  *CHILLS WITH OR WITHOUT FEVER  NAUSEA AND VOMITING THAT IS NOT CONTROLLED WITH YOUR NAUSEA MEDICATION  *UNUSUAL SHORTNESS OF BREATH  *UNUSUAL BRUISING OR BLEEDING  TENDERNESS IN MOUTH AND THROAT WITH OR WITHOUT PRESENCE OF ULCERS  *URINARY PROBLEMS  *BOWEL PROBLEMS  UNUSUAL RASH Items with * indicate a potential emergency and should be followed up as soon as possible.  Feel free to call the clinic should you have any questions or concerns. The clinic phone number is [phone].  Please show the CHEMO ALERT CARD at check-in to the Emergency Department and triage nurse.    

## 2019-12-21 ENCOUNTER — Telehealth: Payer: Self-pay | Admitting: Physician Assistant

## 2019-12-21 NOTE — Telephone Encounter (Signed)
Scheduled per los. Called, not able to leave msg. Mailed printout  

## 2020-01-10 ENCOUNTER — Encounter: Payer: Self-pay | Admitting: Internal Medicine

## 2020-01-10 ENCOUNTER — Other Ambulatory Visit: Payer: Self-pay

## 2020-01-10 ENCOUNTER — Inpatient Hospital Stay: Payer: Medicare Other

## 2020-01-10 ENCOUNTER — Inpatient Hospital Stay: Payer: Medicare Other | Attending: Physician Assistant | Admitting: Internal Medicine

## 2020-01-10 VITALS — BP 157/71 | HR 60 | Temp 97.6°F | Resp 18 | Ht 61.0 in | Wt 180.6 lb

## 2020-01-10 DIAGNOSIS — Z79899 Other long term (current) drug therapy: Secondary | ICD-10-CM | POA: Insufficient documentation

## 2020-01-10 DIAGNOSIS — C3491 Malignant neoplasm of unspecified part of right bronchus or lung: Secondary | ICD-10-CM | POA: Diagnosis not present

## 2020-01-10 DIAGNOSIS — C7931 Secondary malignant neoplasm of brain: Secondary | ICD-10-CM | POA: Insufficient documentation

## 2020-01-10 DIAGNOSIS — I10 Essential (primary) hypertension: Secondary | ICD-10-CM

## 2020-01-10 DIAGNOSIS — I25118 Atherosclerotic heart disease of native coronary artery with other forms of angina pectoris: Secondary | ICD-10-CM | POA: Diagnosis not present

## 2020-01-10 DIAGNOSIS — R0981 Nasal congestion: Secondary | ICD-10-CM | POA: Diagnosis not present

## 2020-01-10 DIAGNOSIS — Z5112 Encounter for antineoplastic immunotherapy: Secondary | ICD-10-CM | POA: Diagnosis not present

## 2020-01-10 DIAGNOSIS — J019 Acute sinusitis, unspecified: Secondary | ICD-10-CM | POA: Diagnosis not present

## 2020-01-10 DIAGNOSIS — C3490 Malignant neoplasm of unspecified part of unspecified bronchus or lung: Secondary | ICD-10-CM

## 2020-01-10 DIAGNOSIS — C3412 Malignant neoplasm of upper lobe, left bronchus or lung: Secondary | ICD-10-CM | POA: Insufficient documentation

## 2020-01-10 DIAGNOSIS — C3492 Malignant neoplasm of unspecified part of left bronchus or lung: Secondary | ICD-10-CM

## 2020-01-10 DIAGNOSIS — Z95828 Presence of other vascular implants and grafts: Secondary | ICD-10-CM

## 2020-01-10 DIAGNOSIS — R5383 Other fatigue: Secondary | ICD-10-CM

## 2020-01-10 LAB — CMP (CANCER CENTER ONLY)
ALT: 11 U/L (ref 0–44)
AST: 21 U/L (ref 15–41)
Albumin: 3.5 g/dL (ref 3.5–5.0)
Alkaline Phosphatase: 75 U/L (ref 38–126)
Anion gap: 10 (ref 5–15)
BUN: 15 mg/dL (ref 8–23)
CO2: 27 mmol/L (ref 22–32)
Calcium: 9.5 mg/dL (ref 8.9–10.3)
Chloride: 105 mmol/L (ref 98–111)
Creatinine: 1.66 mg/dL — ABNORMAL HIGH (ref 0.44–1.00)
GFR, Estimated: 34 mL/min — ABNORMAL LOW
Glucose, Bld: 99 mg/dL (ref 70–99)
Potassium: 4.1 mmol/L (ref 3.5–5.1)
Sodium: 142 mmol/L (ref 135–145)
Total Bilirubin: 0.3 mg/dL (ref 0.3–1.2)
Total Protein: 7.3 g/dL (ref 6.5–8.1)

## 2020-01-10 LAB — CBC WITH DIFFERENTIAL (CANCER CENTER ONLY)
Abs Immature Granulocytes: 0.02 K/uL (ref 0.00–0.07)
Basophils Absolute: 0.1 K/uL (ref 0.0–0.1)
Basophils Relative: 1 %
Eosinophils Absolute: 0.9 K/uL — ABNORMAL HIGH (ref 0.0–0.5)
Eosinophils Relative: 12 %
HCT: 34.4 % — ABNORMAL LOW (ref 36.0–46.0)
Hemoglobin: 10.6 g/dL — ABNORMAL LOW (ref 12.0–15.0)
Immature Granulocytes: 0 %
Lymphocytes Relative: 23 %
Lymphs Abs: 1.6 K/uL (ref 0.7–4.0)
MCH: 24.5 pg — ABNORMAL LOW (ref 26.0–34.0)
MCHC: 30.8 g/dL (ref 30.0–36.0)
MCV: 79.6 fL — ABNORMAL LOW (ref 80.0–100.0)
Monocytes Absolute: 0.7 K/uL (ref 0.1–1.0)
Monocytes Relative: 10 %
Neutro Abs: 3.9 K/uL (ref 1.7–7.7)
Neutrophils Relative %: 54 %
Platelet Count: 257 K/uL (ref 150–400)
RBC: 4.32 MIL/uL (ref 3.87–5.11)
RDW: 16.1 % — ABNORMAL HIGH (ref 11.5–15.5)
WBC Count: 7.1 K/uL (ref 4.0–10.5)
nRBC: 0 % (ref 0.0–0.2)

## 2020-01-10 LAB — TSH: TSH: 2.092 u[IU]/mL (ref 0.308–3.960)

## 2020-01-10 MED ORDER — SODIUM CHLORIDE 0.9% FLUSH
10.0000 mL | INTRAVENOUS | Status: DC | PRN
  Administered 2020-01-10: 10 mL
  Filled 2020-01-10: qty 10

## 2020-01-10 MED ORDER — HEPARIN SOD (PORK) LOCK FLUSH 100 UNIT/ML IV SOLN
500.0000 [IU] | Freq: Once | INTRAVENOUS | Status: AC | PRN
  Administered 2020-01-10: 500 [IU]
  Filled 2020-01-10: qty 5

## 2020-01-10 MED ORDER — SODIUM CHLORIDE 0.9 % IV SOLN
Freq: Once | INTRAVENOUS | Status: AC
  Filled 2020-01-10: qty 250

## 2020-01-10 MED ORDER — SODIUM CHLORIDE 0.9 % IV SOLN
200.0000 mg | Freq: Once | INTRAVENOUS | Status: AC
  Administered 2020-01-10: 200 mg via INTRAVENOUS
  Filled 2020-01-10 (×3): qty 8

## 2020-01-10 NOTE — Progress Notes (Signed)
Per Dr. Julien Nordmann, ok for treatment today with elevated creatine. Pt. stable for discharge. Left via ambulation, no respiratory distress noted.

## 2020-01-10 NOTE — Patient Instructions (Signed)
[facility] Discharge Instructions for Patients Receiving Chemotherapy  Today you received the following immunotherapy agent: Pembrolizumab Beryle Flock)  To help prevent nausea and vomiting after your treatment, we encourage you to take your nausea medication as directed by your MD.   If you develop nausea and vomiting that is not controlled by your nausea medication, call the clinic.   BELOW ARE SYMPTOMS THAT SHOULD BE REPORTED IMMEDIATELY:  *FEVER GREATER THAN 100.5 F  *CHILLS WITH OR WITHOUT FEVER  NAUSEA AND VOMITING THAT IS NOT CONTROLLED WITH YOUR NAUSEA MEDICATION  *UNUSUAL SHORTNESS OF BREATH  *UNUSUAL BRUISING OR BLEEDING  TENDERNESS IN MOUTH AND THROAT WITH OR WITHOUT PRESENCE OF ULCERS  *URINARY PROBLEMS  *BOWEL PROBLEMS  UNUSUAL RASH Items with * indicate a potential emergency and should be followed up as soon as possible.  Feel free to call the clinic should you have any questions or concerns. The clinic phone number is [phone].  Please show the [place] at check-in to the Emergency Department and triage nurse.

## 2020-01-10 NOTE — Progress Notes (Signed)
[facility] Telephone:([phone]) [phone]   Fax:([phone]) [phone]  OFFICE PROGRESS NOTE  [name], MD [address] [place] [address]  DIAGNOSIS: Stage IV (T1c, N1, M1c ) non-small cell lung cancer, adenocarcinoma diagnosed in July 2020 and presented with left upper lobe lung nodule in addition to right upper lobe lung nodule with left hilar lymphadenopathy as well as multiple brain metastasis.  Biomarker Findings Microsatellite status - MS-Stable Tumor Mutational Burden - 4 Muts/Mb Genomic Findings For a complete list of the genes assayed, please refer to the Appendix. ATM G204* KRAS G12C PDGFRA P122T SMO R199W 7 Disease relevant genes with no reportable alterations: ALK, BRAF, EGFR, ERBB2, MET, RET, ROS1  PDL 1 expression was 1%  PRIOR THERAPY: SBRT to multiple brain metastasis under the care of Dr. Isidore Moos.  CURRENT THERAPY: Systemic chemotherapy with carboplatin for AUC of 5, Alimta 500 mg/M2 and Keytruda 200 mg IV every 3 weeks.  First dose October 05, 2018.  Status post 22 cycles.  Starting from cycle #5 the patient will be on treatment with maintenance Alimta 500 mg/M2 and Keytruda 200 mg IV every 3 weeks.  She has been on treatment with single agent Keytruda recently secondary to renal insufficiency.  INTERVAL HISTORY: Carolyn [name] 64 y.o. female returns to the clinic today for follow-up visit.  The patient is feeling fine today with no concerning complaints except for sinus congestion and occasional headache.  She is planning to see her primary care physician later today for evaluation of this condition.  She denied having any current chest pain, shortness of breath, cough or hemoptysis.  She denied having any fever or chills.  She has no nausea, vomiting, diarrhea or constipation.  She denied having any weight loss or night sweats.  She is here today for evaluation before starting cycle #23 of her treatment.   MEDICAL HISTORY: Past  Medical History:  Diagnosis Date  . Anginal pain ([place])    " with exertion "  . Arthritis    " 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 "  . Asthma    h/o  . Coronary artery disease   . Diabetes mellitus without complication ([place])    Type II  . GERD (gastroesophageal reflux disease)   . H/O hiatal hernia   . Heart murmur   . History of radiation therapy 09/22/2018   stereotactic radiosurgery   . 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 Hypertension   . nscl ca dx'd 08/07/18  . Persistent headaches    h/o  . Pneumonia    hx of PNA  . Shortness of breath   . Vaginal dryness    h/o    ALLERGIES:  is allergic to lisinopril.  MEDICATIONS:  Current Outpatient Medications  Medication Sig Dispense Refill  . acetaminophen (TYLENOL) 500 MG tablet Take 1,000 mg by mouth every 8 (eight) hours as needed for mild pain, fever or headache.     Marland Kitchen amLODipine (NORVASC) 5 MG tablet Take 1 tablet by mouth once daily 90 tablet 2  . aspirin EC 81 MG tablet Take 81 mg by mouth daily.    . carvedilol (COREG) 25 MG tablet Take 1/2 (one-half) tablet by mouth twice daily 180 tablet 0  . cetirizine (ZYRTEC) 10 MG tablet Take 10 mg by mouth daily.     . Cholecalciferol (VITAMIN D-3) 125 MCG (5000 UT) TABS Take 5,000 Units by mouth daily.    . Chromium Picolinate 1000 MCG TABS  Take 1,000 mg by mouth daily.    . famotidine (PEPCID) 20 MG tablet Take 20 mg by mouth 2 (two) times daily.    . Lancets (ONETOUCH DELICA PLUS [id number]) MISC SMARTSIG:1 Topical 3 Times Daily    . lidocaine-prilocaine (EMLA) cream Apply to the Port-A-Cath site 30 to 60 minutes before chemotherapy. 30 g 2  . metFORMIN (GLUCOPHAGE) 500 MG tablet Take 500 mg by mouth 2 (two) times daily with a meal.     . methocarbamol (ROBAXIN) 500 MG tablet Take 1 tablet (500 mg total) by mouth every 6 (six) hours as needed for muscle spasms. (Patient not taking: Reported on 11/12/2019) 21 tablet 0  . nitroGLYCERIN (NITROSTAT) 0.4 MG SL tablet Place 1 tablet (0.4 mg total) under the tongue  every 5 (five) minutes as needed for chest pain. 25 tablet 2  . ONETOUCH ULTRA test strip USE 1 STRIP TO CHECK GLUCOSE THREE TIMES DAILY    . Pitavastatin Calcium (LIVALO) 4 MG TABS Take 4 mg by mouth at bedtime.     Vladimir Faster Glycol-Propyl Glycol (SYSTANE) 0.4-0.3 % SOLN Place 1 drop into both eyes every 6 (six) hours as needed (dry eyes).     . prochlorperazine (COMPAZINE) 10 MG tablet Take 1 tablet (10 mg total) by mouth every 6 (six) hours as needed for nausea or vomiting. (Patient not taking: Reported on 11/12/2019) 30 tablet 2   No current facility-administered medications for this visit.    SURGICAL HISTORY:  Past Surgical History:  Procedure Laterality Date  . ABDOMINAL HYSTERECTOMY    . bone spur    . CARDIAC CATHETERIZATION  06/13/2012  . carpel tunnel surgery Left   . COLONOSCOPY     9 years ago  . CORONARY ANGIOPLASTY  06/13/2012  . EYE SURGERY     cyst left eye   . IR IMAGING GUIDED PORT INSERTION  10/10/2018  . LEFT HEART CATHETERIZATION WITH CORONARY ANGIOGRAM N/A 06/13/2012   Procedure: LEFT HEART CATHETERIZATION WITH CORONARY ANGIOGRAM;  Surgeon: Laverda Page, MD;  Location: [facility] CATH LAB;  Service: Cardiovascular;  Laterality: N/A;  . NECK SURGERY    . rotator cuff surgery Right 2015  . VIDEO BRONCHOSCOPY WITH ENDOBRONCHIAL NAVIGATION N/A 09/06/2018   Procedure: VIDEO BRONCHOSCOPY WITH ENDOBRONCHIAL NAVIGATION, left upper lung;  Surgeon: Lajuana Matte, MD;  Location: [facility];  Service: Thoracic;  Laterality: N/A;  . VIDEO BRONCHOSCOPY WITH ENDOBRONCHIAL ULTRASOUND Left 09/06/2018   Procedure: VIDEO BRONCHOSCOPY WITH ENDOBRONCHIAL ULTRASOUND, left lung;  Surgeon: Lajuana Matte, MD;  Location: [facility];  Service: Thoracic;  Laterality: Left;  . WISDOM TOOTH EXTRACTION      REVIEW OF SYSTEMS:  A comprehensive review of systems was negative except for: Ears, nose, mouth, throat, and face: positive for nasal congestion   PHYSICAL EXAMINATION: General appearance:  alert, cooperative, fatigued and no distress Head: Normocephalic, without obvious abnormality, atraumatic Neck: no adenopathy, no JVD, supple, symmetrical, trachea midline and thyroid not enlarged, symmetric, no tenderness/mass/nodules Lymph nodes: Cervical, supraclavicular, and axillary nodes normal. Resp: clear to auscultation bilaterally Back: symmetric, no curvature. ROM normal. No CVA tenderness. Cardio: regular rate and rhythm, S1, S2 normal, no murmur, click, rub or gallop GI: soft, non-tender; bowel sounds normal; no masses,  no organomegaly Extremities: extremities normal, atraumatic, no cyanosis or edema  ECOG PERFORMANCE STATUS: 1 - Symptomatic but completely ambulatory  Blood pressure (!) 157/71, pulse 60, temperature 97.6 F (36.4 C), temperature source Tympanic, resp. rate 18, height $RemoveBe'5\' 1"'iguoNrXFa$  (1.549 m),  weight 180 lb 9.6 oz (81.9 kg), SpO2 100 %.  LABORATORY DATA: Lab Results  Component Value Date   WBC 7.1 01/10/2020   HGB 10.6 (L) 01/10/2020   HCT 34.4 (L) 01/10/2020   MCV 79.6 (L) 01/10/2020   PLT 257 01/10/2020      Chemistry      Component Value Date/Time   NA 141 12/20/2019 0912   K 4.0 12/20/2019 0912   CL 105 12/20/2019 0912   CO2 28 12/20/2019 0912   BUN 15 12/20/2019 0912   CREATININE 1.82 (H) 12/20/2019 0912      Component Value Date/Time   CALCIUM 9.2 12/20/2019 0912   ALKPHOS 78 12/20/2019 0912   AST 19 12/20/2019 0912   ALT 10 12/20/2019 0912   BILITOT 0.4 12/20/2019 0912       RADIOGRAPHIC STUDIES: No results found.  ASSESSMENT AND PLAN: This is a very pleasant 64 years old African-American female with likely stage IV (T1c, N1, M1C) non-small cell lung cancer, adenocarcinoma with K-ras G12C mutation diagnosed in July 2020 and presented with left upper lobe lung nodule in addition to right upper lobe pulmonary nodule as well as left hilar adenopathy and metastatic lesion to the brain. Molecular studies showed no actionable mutation and PDL 1  expression was 1%. The patient underwent SBRT to the metastatic brain lesion under the care of Dr. Isidore Moos. The patient is currently undergoing systemic chemotherapy with carboplatin for AUC of 5, Alimta 500 mg/M2 and Keytruda 200 mg IV every 3 weeks is status post 22 cycles.  Starting from cycle #5 she is on maintenance treatment with Alimta and Keytruda every 3 weeks with only Keytruda on the last few cycles because of the renal insufficiency. The patient continues to tolerate this treatment well with no concerning adverse effects. I recommended for her to proceed with cycle #23 today as planned. I will see her back for follow-up visit in 3 weeks for evaluation with repeat CT scan of the chest, abdomen pelvis for restaging of her disease. The patient was advised to call immediately if she has any concerning symptoms in the interval. For the renal insufficiency she is currently followed by nephrology.  The patient voices understanding of current disease status and treatment options and is in agreement with the current care plan. All questions were answered. The patient knows to call the clinic with any problems, questions or concerns. We can certainly see the patient much sooner if necessary.  Disclaimer: This note was dictated with voice recognition software. Similar sounding words can inadvertently be transcribed and may not be corrected upon review.

## 2020-01-14 ENCOUNTER — Other Ambulatory Visit: Payer: Self-pay | Admitting: Cardiology

## 2020-01-17 ENCOUNTER — Telehealth: Payer: Self-pay

## 2020-01-17 NOTE — Telephone Encounter (Signed)
Pt called requesting her 01/30/20 infusion appt be moved to earlier in the day. She indicated by the time she is done it will be dark, she is alone and she does not live in [place]. I advised the pt I will send a message to the scheduling department.  Schedule message has been sent.

## 2020-01-23 NOTE — Progress Notes (Signed)
[facility] OFFICE PROGRESS NOTE  [name], MD [address] [place] [address]  DIAGNOSIS: Stage IV(T1c, N1, M1 C)non-small cell lung cancer, adenocarcinoma. Shepresented with left upper lobe lung nodule in addition to right upper lobe lung nodule with left hilar lymphadenopathy as well as multiple brain metastasis.She was diagnosed in July of 2020  Biomarker Findings Microsatellite status - MS-Stable Tumor Mutational Burden - 4 Muts/Mb Genomic Findings For a complete list of the genes assayed, please refer to the Appendix. ATM G204* KRAS G12C PDGFRA P122T SMO R199W 7 Disease relevant genes with no reportable alterations: ALK, BRAF, EGFR, ERBB2, MET, RET, ROS1  PDL 1 expressionwas 1%  PRIOR THERAPY: SBRT to multiple brain metastasis under the care of Dr. Isidore Moos.Completed in August 2020  CURRENT THERAPY: Systemic chemotherapy with Carboplatin for an AUC of 5, Alimta 500 mg/m2, and Keytruda 200 mg IV every 3 weeks. Status post23cyclesof treatment. First dose on 10/05/2018.She started maintenance Keytruda and Alimta starting from cycle #5. Alimta was discontinued due to renal insufficiency.  INTERVAL HISTORY: Carolyn [name] 64 y.o. female returns to the clinic today for a follow-up visit.  The patient is feeling well today without any concerning complaints. The patient continues to tolerate her treatment with single agent Keytruda well without any adverse side effects.  The patient's Alimta was discontinued secondary to renal insufficiency for which she follows with nephrology.  The patient denies any recent fever, chills, or weight loss. She reports baseline night sweats. She denies any chest pain, shortness of breath, cough, or hemoptysis.  She denies any nausea, vomiting, diarrhea, or constipation.  She denies any rashes or skin changes.  She denies any visual changes and she is scheduled for a repeat brain MRI next month in January 2022.  She had one episode of a headache. She follows with neuro oncology for her history of brain metastases and has an appointment with them after her brain MRI.  The patient has a restaging CT scan of the chest, abdomen, and pelvis scheduled for tomorrow.  The patient is here today for evaluation before starting cycle #24 of her treatment.    MEDICAL HISTORY: Past Medical History:  Diagnosis Date  . Anginal pain ([place])    " with exertion "  . Arthritis    " 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 "  . Asthma    h/o  . Coronary artery disease   . Diabetes mellitus without complication ([place])    Type II  . GERD (gastroesophageal reflux disease)   . H/O hiatal hernia   . Heart murmur   . History of radiation therapy 09/22/2018   stereotactic radiosurgery   . 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 Hypertension   . nscl ca dx'd 08/07/18  . Persistent headaches    h/o  . Pneumonia    hx of PNA  . Shortness of breath   . Vaginal dryness    h/o    ALLERGIES:  is allergic to lisinopril.  MEDICATIONS:  Current Outpatient Medications  Medication Sig Dispense Refill  . acetaminophen (TYLENOL) 500 MG tablet Take 1,000 mg by mouth every 8 (eight) hours as needed for mild pain, fever or headache.     Marland Kitchen amLODipine (NORVASC) 5 MG tablet Take 1 tablet by mouth once daily 90 tablet 2  . aspirin EC 81 MG tablet Take 81 mg by mouth daily.    . carvedilol (COREG) 25 MG tablet Take 1/2 (one-half) tablet by mouth twice daily  180 tablet 0  . cetirizine (ZYRTEC) 10 MG tablet Take 10 mg by mouth daily.     . Cholecalciferol (VITAMIN D-3) 125 MCG (5000 UT) TABS Take 5,000 Units by mouth daily.    . Chromium Picolinate 1000 MCG TABS Take 1,000 mg by mouth daily.    . famotidine (PEPCID) 20 MG tablet Take 20 mg by mouth 2 (two) times daily.    . Lancets (ONETOUCH DELICA PLUS [id number]) MISC SMARTSIG:1 Topical 3 Times Daily    . lidocaine-prilocaine (EMLA) cream Apply to the Port-A-Cath site 30 to 60 minutes before chemotherapy. 30 g 2  .  metFORMIN (GLUCOPHAGE) 500 MG tablet Take 500 mg by mouth 2 (two) times daily with a meal.     . methocarbamol (ROBAXIN) 500 MG tablet Take 1 tablet (500 mg total) by mouth every 6 (six) hours as needed for muscle spasms. (Patient not taking: Reported on 11/12/2019) 21 tablet 0  . nitroGLYCERIN (NITROSTAT) 0.4 MG SL tablet Place 1 tablet (0.4 mg total) under the tongue every 5 (five) minutes as needed for chest pain. 25 tablet 2  . ONETOUCH ULTRA test strip USE 1 STRIP TO CHECK GLUCOSE THREE TIMES DAILY    . Pitavastatin Calcium (LIVALO) 4 MG TABS Take 4 mg by mouth at bedtime.     Vladimir Faster Glycol-Propyl Glycol (SYSTANE) 0.4-0.3 % SOLN Place 1 drop into both eyes every 6 (six) hours as needed (dry eyes).     . prochlorperazine (COMPAZINE) 10 MG tablet Take 1 tablet (10 mg total) by mouth every 6 (six) hours as needed for nausea or vomiting. (Patient not taking: Reported on 11/12/2019) 30 tablet 2   No current facility-administered medications for this visit.    SURGICAL HISTORY:  Past Surgical History:  Procedure Laterality Date  . ABDOMINAL HYSTERECTOMY    . bone spur    . CARDIAC CATHETERIZATION  06/13/2012  . carpel tunnel surgery Left   . COLONOSCOPY     9 years ago  . CORONARY ANGIOPLASTY  06/13/2012  . EYE SURGERY     cyst left eye   . IR IMAGING GUIDED PORT INSERTION  10/10/2018  . LEFT HEART CATHETERIZATION WITH CORONARY ANGIOGRAM N/A 06/13/2012   Procedure: LEFT HEART CATHETERIZATION WITH CORONARY ANGIOGRAM;  Surgeon: Laverda Page, MD;  Location: [facility] CATH LAB;  Service: Cardiovascular;  Laterality: N/A;  . NECK SURGERY    . rotator cuff surgery Right 2015  . VIDEO BRONCHOSCOPY WITH ENDOBRONCHIAL NAVIGATION N/A 09/06/2018   Procedure: VIDEO BRONCHOSCOPY WITH ENDOBRONCHIAL NAVIGATION, left upper lung;  Surgeon: Lajuana Matte, MD;  Location: [facility];  Service: Thoracic;  Laterality: N/A;  . VIDEO BRONCHOSCOPY WITH ENDOBRONCHIAL ULTRASOUND Left 09/06/2018   Procedure: VIDEO  BRONCHOSCOPY WITH ENDOBRONCHIAL ULTRASOUND, left lung;  Surgeon: Lajuana Matte, MD;  Location: [facility];  Service: Thoracic;  Laterality: Left;  . WISDOM TOOTH EXTRACTION      REVIEW OF SYSTEMS:   Review of Systems  Constitutional: Negative for appetite change, chills, fatigue, fever and unexpected weight change.  HENT:   Negative for mouth sores, nosebleeds, sore throat and trouble swallowing.   Eyes: Negative for eye problems and icterus.  Respiratory: Negative for cough, hemoptysis, shortness of breath and wheezing.   Cardiovascular: Negative for chest pain and leg swelling.  Gastrointestinal: Negative for abdominal pain, constipation, diarrhea, nausea and vomiting.  Genitourinary: Negative for bladder incontinence, difficulty urinating, dysuria, frequency and hematuria.   Musculoskeletal: Negative for back pain, gait problem, neck pain and neck  stiffness.  Skin: Negative for itching and rash.  Neurological: Negative for dizziness, extremity weakness, gait problem, headaches, light-headedness and seizures.  Hematological: Negative for adenopathy. Does not bruise/bleed easily.  Psychiatric/Behavioral: Negative for confusion, depression and sleep disturbance. The patient is not nervous/anxious.     PHYSICAL EXAMINATION:  There were no vitals taken for this visit.  ECOG PERFORMANCE STATUS: 1 - Symptomatic but completely ambulatory  Physical Exam  Constitutional: Oriented to person, place, and time and well-developed, well-nourished, and in no distress. No distress.  HENT:  Head: Normocephalic and atraumatic.  Mouth/Throat: Oropharynx is clear and moist. No oropharyngeal exudate.  Eyes: Conjunctivae are normal. Right eye exhibits no discharge. Left eye exhibits no discharge. No scleral icterus.  Neck: Normal range of motion. Neck supple.  Cardiovascular: Normal rate, regular rhythm, normal heart sounds and intact distal pulses.   Pulmonary/Chest: Effort normal and breath sounds  normal. No respiratory distress. No wheezes. No rales.  Abdominal: Soft. Bowel sounds are normal. Exhibits no distension and no mass. There is no tenderness.  Musculoskeletal: Normal range of motion. Exhibits no edema.  Lymphadenopathy:    No cervical adenopathy.  Neurological: Alert and oriented to person, place, and time. Exhibits normal muscle tone. Gait normal. Coordination normal.  Skin: Skin is warm and dry. No rash noted. Not diaphoretic. No erythema. No pallor.  Psychiatric: Mood, memory and judgment normal.  Vitals reviewed.  LABORATORY DATA: Lab Results  Component Value Date   WBC 7.1 01/10/2020   HGB 10.6 (L) 01/10/2020   HCT 34.4 (L) 01/10/2020   MCV 79.6 (L) 01/10/2020   PLT 257 01/10/2020      Chemistry      Component Value Date/Time   NA 142 01/10/2020 0929   K 4.1 01/10/2020 0929   CL 105 01/10/2020 0929   CO2 27 01/10/2020 0929   BUN 15 01/10/2020 0929   CREATININE 1.66 (H) 01/10/2020 0929      Component Value Date/Time   CALCIUM 9.5 01/10/2020 0929   ALKPHOS 75 01/10/2020 0929   AST 21 01/10/2020 0929   ALT 11 01/10/2020 0929   BILITOT 0.3 01/10/2020 0929       RADIOGRAPHIC STUDIES:  No results found.   ASSESSMENT/PLAN:  This is a very pleasant 64 year old African-American female with stage IV (T1c, N1,M1c)non-small celllung cancer, adenocarcinoma. She presented with a dominant left upper lobe lung nodule in addition to a right upper lobe pulmonary nodule with left hilar lymphadenopathy. She also has metastatic disease to the brain.She has no actionable mutations and her PDL1 expression is 1%. She was diagnosed in July 2020.   The patient completed SBRT to the metastatic brain lesion under the care of Dr. Isidore Moos in August 2020.   The patient is currently undergoing systemic chemotherapy withcarboplatin for an AUC of 5, Alimta 500 mg/m, and Keytruda 200 mg IV every 3 weeks. She is status post23cyclesof treatment. Starting from cycle #5,  she has been on maintenance Alimta 500 mg/m2 and Keytruda 200 mg IV.She tolerated her treatment well without anyconcerningadverse side effects.Alimta wasdiscontinued due to renal insufficiency.   The patient's restaging CT scan is scheduled for tomorrow. Labs were reviewed today. Recommend that she proceed with cycle #24 today as scheduled.    We will see her back for follow-up visit in 3 weeks for evaluation before starting cycle #25.  She will get her brain MRI next month as scheduled.   The patient was advised to call immediately if she has any concerning symptoms in the  interval. The patient voices understanding of current disease status and treatment options and is in agreement with the current care plan. All questions were answered. The patient knows to call the clinic with any problems, questions or concerns. We can certainly see the patient much sooner if necessary         No orders of the defined types were placed in this encounter.    Abbe Bula L Natoya Viscomi, PA-C 01/23/20

## 2020-01-30 ENCOUNTER — Ambulatory Visit: Payer: Medicare Other | Admitting: Physician Assistant

## 2020-01-30 ENCOUNTER — Other Ambulatory Visit: Payer: Self-pay | Admitting: Physician Assistant

## 2020-01-30 ENCOUNTER — Other Ambulatory Visit: Payer: Medicare Other

## 2020-01-30 ENCOUNTER — Inpatient Hospital Stay: Payer: Medicare Other

## 2020-01-30 ENCOUNTER — Other Ambulatory Visit: Payer: Self-pay

## 2020-01-30 ENCOUNTER — Telehealth: Payer: Self-pay | Admitting: Physician Assistant

## 2020-01-30 ENCOUNTER — Encounter: Payer: Self-pay | Admitting: Physician Assistant

## 2020-01-30 ENCOUNTER — Ambulatory Visit: Payer: Medicare Other

## 2020-01-30 ENCOUNTER — Inpatient Hospital Stay (HOSPITAL_BASED_OUTPATIENT_CLINIC_OR_DEPARTMENT_OTHER): Payer: Medicare Other | Admitting: Physician Assistant

## 2020-01-30 VITALS — BP 135/68 | HR 64 | Temp 97.2°F | Resp 18 | Ht 61.0 in | Wt 180.9 lb

## 2020-01-30 DIAGNOSIS — C3491 Malignant neoplasm of unspecified part of right bronchus or lung: Secondary | ICD-10-CM

## 2020-01-30 DIAGNOSIS — C3492 Malignant neoplasm of unspecified part of left bronchus or lung: Secondary | ICD-10-CM

## 2020-01-30 DIAGNOSIS — Z95828 Presence of other vascular implants and grafts: Secondary | ICD-10-CM

## 2020-01-30 DIAGNOSIS — Z79899 Other long term (current) drug therapy: Secondary | ICD-10-CM | POA: Diagnosis not present

## 2020-01-30 DIAGNOSIS — R5383 Other fatigue: Secondary | ICD-10-CM

## 2020-01-30 DIAGNOSIS — Z5112 Encounter for antineoplastic immunotherapy: Secondary | ICD-10-CM | POA: Diagnosis not present

## 2020-01-30 DIAGNOSIS — C3412 Malignant neoplasm of upper lobe, left bronchus or lung: Secondary | ICD-10-CM | POA: Diagnosis not present

## 2020-01-30 DIAGNOSIS — C7931 Secondary malignant neoplasm of brain: Secondary | ICD-10-CM | POA: Diagnosis not present

## 2020-01-30 LAB — CMP (CANCER CENTER ONLY)
ALT: 11 U/L (ref 0–44)
AST: 18 U/L (ref 15–41)
Albumin: 3.3 g/dL — ABNORMAL LOW (ref 3.5–5.0)
Alkaline Phosphatase: 62 U/L (ref 38–126)
Anion gap: 9 (ref 5–15)
BUN: 20 mg/dL (ref 8–23)
CO2: 24 mmol/L (ref 22–32)
Calcium: 8.9 mg/dL (ref 8.9–10.3)
Chloride: 107 mmol/L (ref 98–111)
Creatinine: 1.71 mg/dL — ABNORMAL HIGH (ref 0.44–1.00)
GFR, Estimated: 33 mL/min — ABNORMAL LOW (ref >60)
Glucose, Bld: 151 mg/dL — ABNORMAL HIGH (ref 70–99)
Potassium: 3.9 mmol/L (ref 3.5–5.1)
Sodium: 140 mmol/L (ref 135–145)
Total Bilirubin: 0.3 mg/dL (ref 0.3–1.2)
Total Protein: 6.7 g/dL (ref 6.5–8.1)

## 2020-01-30 LAB — CBC WITH DIFFERENTIAL (CANCER CENTER ONLY)
Abs Immature Granulocytes: 0.02 10*3/uL (ref 0.00–0.07)
Basophils Absolute: 0 10*3/uL (ref 0.0–0.1)
Basophils Relative: 1 %
Eosinophils Absolute: 0.4 10*3/uL (ref 0.0–0.5)
Eosinophils Relative: 7 %
HCT: 33.6 % — ABNORMAL LOW (ref 36.0–46.0)
Hemoglobin: 10.3 g/dL — ABNORMAL LOW (ref 12.0–15.0)
Immature Granulocytes: 0 %
Lymphocytes Relative: 25 %
Lymphs Abs: 1.5 10*3/uL (ref 0.7–4.0)
MCH: 24.5 pg — ABNORMAL LOW (ref 26.0–34.0)
MCHC: 30.7 g/dL (ref 30.0–36.0)
MCV: 80 fL (ref 80.0–100.0)
Monocytes Absolute: 0.6 10*3/uL (ref 0.1–1.0)
Monocytes Relative: 10 %
Neutro Abs: 3.5 10*3/uL (ref 1.7–7.7)
Neutrophils Relative %: 57 %
Platelet Count: 224 10*3/uL (ref 150–400)
RBC: 4.2 MIL/uL (ref 3.87–5.11)
RDW: 16.2 % — ABNORMAL HIGH (ref 11.5–15.5)
WBC Count: 5.9 10*3/uL (ref 4.0–10.5)
nRBC: 0 % (ref 0.0–0.2)

## 2020-01-30 LAB — TSH: TSH: 2.204 u[IU]/mL (ref 0.308–3.960)

## 2020-01-30 MED ORDER — SODIUM CHLORIDE 0.9% FLUSH
10.0000 mL | INTRAVENOUS | Status: DC | PRN
  Administered 2020-01-30: 08:00:00 10 mL
  Filled 2020-01-30: qty 10

## 2020-01-30 MED ORDER — SODIUM CHLORIDE 0.9% FLUSH
10.0000 mL | INTRAVENOUS | Status: DC | PRN
  Administered 2020-01-30: 11:00:00 10 mL
  Filled 2020-01-30: qty 10

## 2020-01-30 MED ORDER — SODIUM CHLORIDE 0.9 % IV SOLN
Freq: Once | INTRAVENOUS | Status: AC
  Filled 2020-01-30: qty 250

## 2020-01-30 MED ORDER — HEPARIN SOD (PORK) LOCK FLUSH 100 UNIT/ML IV SOLN
500.0000 [IU] | Freq: Once | INTRAVENOUS | Status: AC | PRN
  Administered 2020-01-30: 11:00:00 500 [IU]
  Filled 2020-01-30: qty 5

## 2020-01-30 MED ORDER — SODIUM CHLORIDE 0.9 % IV SOLN
200.0000 mg | Freq: Once | INTRAVENOUS | Status: AC
  Administered 2020-01-30: 10:00:00 200 mg via INTRAVENOUS
  Filled 2020-01-30: qty 8

## 2020-01-30 NOTE — Progress Notes (Signed)
Per Dr. Julien Nordmann, patient is okay to treat with Scr 1.71.

## 2020-01-30 NOTE — Patient Instructions (Signed)
[facility] Discharge Instructions for Patients Receiving Chemotherapy  Today you received the following immunotherapy agent: Pembrolizumab Beryle Flock)  To help prevent nausea and vomiting after your treatment, we encourage you to take your nausea medication as directed by your MD.   If you develop nausea and vomiting that is not controlled by your nausea medication, call the clinic.   BELOW ARE SYMPTOMS THAT SHOULD BE REPORTED IMMEDIATELY:  *FEVER GREATER THAN 100.5 F  *CHILLS WITH OR WITHOUT FEVER  NAUSEA AND VOMITING THAT IS NOT CONTROLLED WITH YOUR NAUSEA MEDICATION  *UNUSUAL SHORTNESS OF BREATH  *UNUSUAL BRUISING OR BLEEDING  TENDERNESS IN MOUTH AND THROAT WITH OR WITHOUT PRESENCE OF ULCERS  *URINARY PROBLEMS  *BOWEL PROBLEMS  UNUSUAL RASH Items with * indicate a potential emergency and should be followed up as soon as possible.  Feel free to call the clinic should you have any questions or concerns. The clinic phone number is [phone].  Please show the [facility] at check-in to the Emergency Department and triage nurse.

## 2020-01-30 NOTE — Telephone Encounter (Signed)
Scheduled appts per 12/22 los. Pt to refer to mychart for appts

## 2020-01-31 ENCOUNTER — Ambulatory Visit (HOSPITAL_COMMUNITY)
Admission: RE | Admit: 2020-01-31 | Discharge: 2020-01-31 | Disposition: A | Discharge status: Home / Self Care | Payer: Medicare Other | Source: Ambulatory Visit | Attending: Internal Medicine | Admitting: Internal Medicine

## 2020-01-31 DIAGNOSIS — M47816 Spondylosis without myelopathy or radiculopathy, lumbar region: Secondary | ICD-10-CM | POA: Diagnosis not present

## 2020-01-31 DIAGNOSIS — I700 Atherosclerosis of aorta: Secondary | ICD-10-CM | POA: Diagnosis not present

## 2020-01-31 DIAGNOSIS — J984 Other disorders of lung: Secondary | ICD-10-CM | POA: Diagnosis not present

## 2020-01-31 DIAGNOSIS — I2510 Atherosclerotic heart disease of native coronary artery without angina pectoris: Secondary | ICD-10-CM | POA: Diagnosis not present

## 2020-01-31 DIAGNOSIS — C3490 Malignant neoplasm of unspecified part of unspecified bronchus or lung: Secondary | ICD-10-CM | POA: Insufficient documentation

## 2020-02-14 ENCOUNTER — Ambulatory Visit
Admission: RE | Admit: 2020-02-14 | Discharge: 2020-02-14 | Disposition: A | Discharge status: Home / Self Care | Payer: Medicare Other | Source: Ambulatory Visit | Attending: Internal Medicine | Admitting: Internal Medicine

## 2020-02-14 DIAGNOSIS — C7931 Secondary malignant neoplasm of brain: Secondary | ICD-10-CM

## 2020-02-14 DIAGNOSIS — R600 Localized edema: Secondary | ICD-10-CM | POA: Diagnosis not present

## 2020-02-14 DIAGNOSIS — G9389 Other specified disorders of brain: Secondary | ICD-10-CM | POA: Diagnosis not present

## 2020-02-14 DIAGNOSIS — Z85118 Personal history of other malignant neoplasm of bronchus and lung: Secondary | ICD-10-CM | POA: Diagnosis not present

## 2020-02-14 MED ORDER — GADOBENATE DIMEGLUMINE 529 MG/ML IV SOLN
20.0000 mL | Freq: Once | INTRAVENOUS | Status: AC | PRN
  Administered 2020-02-14: 15 mL via INTRAVENOUS

## 2020-02-18 NOTE — Progress Notes (Signed)
Carolyn [name] OFFICE PROGRESS NOTE  Carolyn Gravel, MD [address] [place] [address]  DIAGNOSIS: Stage IV(T1c, N1, M1 C)non-small cell lung cancer, adenocarcinoma. Shepresented with left upper lobe lung nodule in addition to right upper lobe lung nodule with left hilar lymphadenopathy as well as multiple brain metastasis.She was diagnosed in July of 2020  Biomarker Findings Microsatellite status - MS-Stable Tumor Mutational Burden - 4 Muts/Mb Genomic Findings For a complete list of the genes assayed, please refer to the Appendix. ATM G204* KRAS G12C PDGFRA P122T SMO R199W 7 Disease relevant genes with no reportable alterations: ALK, BRAF, EGFR, ERBB2, MET, RET, ROS1  PDL 1 expressionwas 1%  PRIOR THERAPY: SBRT to multiple brain metastasis under the care of Dr. Isidore Moos.Completed in August 2020  CURRENT THERAPY: Systemic chemotherapy with Carboplatin for an AUC of 5, Alimta 500 mg/m2, and Keytruda 200 mg IV every 3 weeks. Status post24cyclesof treatment. First dose on 10/05/2018.She started maintenance Keytruda and Alimta starting from cycle #5. Alimta was discontinued due to renal insufficiency  INTERVAL HISTORY: Carolyn [name] 65 y.o. female returns to the clinic today for a follow-up visit.  The patient is feeling well today without any concerning complaints. In the interval since her last treatment, she saw her PCP for sinusitis. She completed prednisone and antibiotics. Her symptoms improved but started up again. She saw her PCP again who recommended Flonase, Singulair, and allegra. The patient continues to tolerate her treatment with single agent Keytruda well without any adverse side effects.  The patient's Alimta was discontinued secondary to renal insufficiency for which she follows with nephrology.  The patient denies any recent fever, chills, or weight loss. She reports baseline night sweats. She denies any chest pain, shortness of breath,  cough, or hemoptysis.  She denies any nausea, vomiting, diarrhea, or constipation.  She denies any rashes or skin changes.  She denies any visual changes. She states sometimes her right eye hurts if she moves it in a certain direction. No abnormalities were seen on recent MRI. She saw an eye doctor in the past for partial retinal detachment and is due for follow up soon. She has headaches every once in awhile. She had a repeat MRI of the brain on 02/14/20. She follows with neuro-oncology. Her scan was stable. The patient had a restaging CT scan of the chest, abdomen, and pelvis. The patient is here today for evaluation and to review hers scan before starting cycle #25 of her treatment.   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  MEDICATIONS:  Current Outpatient Medications  Medication Sig Dispense Refill   acetaminophen (TYLENOL) 500 MG tablet Take 1,000 mg by mouth every 8 (eight)  hours as needed for mild pain, fever or headache.      amLODipine (NORVASC) 5 MG tablet Take 1 tablet by mouth once daily 90 tablet 2   aspirin EC 81 MG tablet Take 81 mg by mouth daily.     carvedilol (COREG) 25 MG tablet Take 1/2 (one-half) tablet by mouth twice daily 180 tablet 0   cetirizine (ZYRTEC) 10 MG tablet Take 10 mg by mouth daily.      Cholecalciferol (VITAMIN D-3) 125 MCG (5000 UT) TABS Take 5,000 Units by mouth daily.      Chromium Picolinate 1000 MCG TABS Take 1,000 mg by mouth daily.     fexofenadine (ALLEGRA) 180 MG tablet 1 tablet swallow whole with water; do not take with fruit juices.     Lancets (ONETOUCH DELICA PLUS [id number]) MISC SMARTSIG:1 Topical 3 Times Daily     lidocaine-prilocaine (EMLA) cream Apply to the Port-A-Cath site 30 to 60 minutes before chemotherapy. 30 g 2   metFORMIN (GLUCOPHAGE) 500 MG tablet Take 500 mg by mouth 2 (two) times daily with a meal.      methocarbamol (ROBAXIN) 500 MG tablet Take 1 tablet (500 mg total) by mouth every 6 (six) hours as needed for muscle spasms. 21 tablet 0   montelukast (SINGULAIR) 10 MG tablet 1 tablet     nitroGLYCERIN (NITROSTAT) 0.4 MG SL tablet Place 1 tablet (0.4 mg total) under the tongue every 5 (five) minutes as needed for chest pain. 25 tablet 2   ONETOUCH ULTRA test strip USE 1 STRIP TO CHECK GLUCOSE THREE TIMES DAILY     Pitavastatin Calcium (LIVALO) 4 MG TABS Take 4 mg by mouth at bedtime.      Polyethyl Glycol-Propyl Glycol 0.4-0.3 % SOLN Place 1 drop into both eyes every 6 (six) hours as needed (dry eyes).      prochlorperazine (COMPAZINE) 10 MG tablet Take 1 tablet (10 mg total) by mouth every 6 (six) hours as needed for nausea or vomiting. 30 tablet 2   No current facility-administered medications for this visit.    SURGICAL HISTORY:  Past Surgical History:  Procedure Laterality Date   ABDOMINAL HYSTERECTOMY     bone spur     CARDIAC CATHETERIZATION  06/13/2012   carpel tunnel surgery Left    COLONOSCOPY     9 years ago   CORONARY ANGIOPLASTY  06/13/2012   EYE SURGERY     cyst left eye    IR IMAGING GUIDED PORT INSERTION  10/10/2018   LEFT HEART CATHETERIZATION WITH CORONARY ANGIOGRAM N/A 06/13/2012   Procedure: LEFT HEART CATHETERIZATION WITH CORONARY ANGIOGRAM;  Surgeon: Laverda Page, MD;  Location: [facility] CATH LAB;  Service: Cardiovascular;  Laterality: N/A;   NECK SURGERY     rotator cuff surgery Right 2015    VIDEO BRONCHOSCOPY WITH ENDOBRONCHIAL NAVIGATION N/A 09/06/2018   Procedure: VIDEO BRONCHOSCOPY WITH ENDOBRONCHIAL NAVIGATION, left upper lung;  Surgeon: Lajuana Matte, MD;  Location: [facility];  Service: Thoracic;  Laterality: N/A;   VIDEO BRONCHOSCOPY WITH ENDOBRONCHIAL ULTRASOUND Left 09/06/2018   Procedure: VIDEO BRONCHOSCOPY WITH ENDOBRONCHIAL ULTRASOUND, left lung;  Surgeon: Lajuana Matte, MD;  Location: [facility];  Service: Thoracic;  Laterality: Left;   WISDOM TOOTH EXTRACTION      REVIEW OF SYSTEMS:   Review of Systems  Constitutional: Negative for appetite change, chills, fatigue, fever and unexpected weight change.  HENT: Negative for mouth sores, nosebleeds, sore throat and trouble swallowing.   Eyes: Occasional R eye discomfort with  certain directions. Negative for eye problems and icterus.  Respiratory: Negative for cough, hemoptysis, shortness of breath and wheezing.   Cardiovascular: Negative for chest pain and leg swelling.  Gastrointestinal: Negative for abdominal pain, constipation, diarrhea, nausea and vomiting.  Genitourinary: Negative for bladder incontinence, difficulty urinating, dysuria, frequency and hematuria.   Musculoskeletal: Negative for back pain, gait problem, neck pain and neck stiffness.  Skin: Negative for itching and rash.  Neurological: Negative for dizziness, extremity weakness, gait problem, unusual headaches, light-headedness and seizures.  Hematological: Negative for adenopathy. Does not bruise/bleed easily.  Psychiatric/Behavioral: Negative for confusion, depression and sleep disturbance. The patient is not nervous/anxious.     PHYSICAL EXAMINATION:  Blood pressure (!) 147/66, pulse 65, temperature (!) 96.2 F (35.7 C), temperature source Tympanic, resp. rate 17, height 5' 1" (1.549 m), weight 182 lb 8 oz (82.8 kg), SpO2 100 %.  ECOG PERFORMANCE STATUS: 1 - Symptomatic but completely ambulatory  Physical Exam  Constitutional: Oriented  to person, place, and time and well-developed, well-nourished, and in no distress.   HENT:  Head: Normocephalic and atraumatic.  Mouth/Throat: Oropharynx is clear and moist. No oropharyngeal exudate.  Eyes: Conjunctivae are normal. Right eye exhibits no discharge. Left eye exhibits no discharge. No scleral icterus.  Neck: Normal range of motion. Neck supple.  Cardiovascular: Normal rate, regular rhythm, normal heart sounds and intact distal pulses.   Pulmonary/Chest: Effort normal and breath sounds normal. No respiratory distress. No wheezes. No rales.  Abdominal: Soft. Bowel sounds are normal. Exhibits no distension and no mass. There is no tenderness.  Musculoskeletal: Normal range of motion. Exhibits no edema.  Lymphadenopathy:    No cervical adenopathy.  Neurological: Alert and oriented to person, place, and time. Exhibits normal muscle tone. Gait normal. Coordination normal.  Skin: Skin is warm and dry. No rash noted. Not diaphoretic. No erythema. No pallor.  Psychiatric: Mood, memory and judgment normal.  Vitals reviewed.  LABORATORY DATA: Lab Results  Component Value Date   WBC 6.5 02/21/2020   HGB 10.7 (L) 02/21/2020   HCT 35.2 (L) 02/21/2020   MCV 80.2 02/21/2020   PLT 258 02/21/2020      Chemistry      Component Value Date/Time   NA 143 02/21/2020 0830   K 3.9 02/21/2020 0830   CL 106 02/21/2020 0830   CO2 29 02/21/2020 0830   BUN 14 02/21/2020 0830   CREATININE 1.66 (H) 02/21/2020 0830      Component Value Date/Time   CALCIUM 9.1 02/21/2020 0830   ALKPHOS 71 02/21/2020 0830   AST 21 02/21/2020 0830   ALT 13 02/21/2020 0830   BILITOT 0.4 02/21/2020 0830       RADIOGRAPHIC STUDIES:  CT Abdomen Pelvis Wo Contrast  Result Date: 01/31/2020 CLINICAL DATA:  Non-small-cell lung cancer.  Restaging. EXAM: CT CHEST, ABDOMEN AND PELVIS WITHOUT CONTRAST TECHNIQUE: Multidetector CT imaging of the chest, abdomen and pelvis was performed following the standard protocol  without IV contrast. COMPARISON:  11/26/2019 FINDINGS: CT CHEST FINDINGS Cardiovascular: The heart size is normal. No substantial pericardial effusion. Coronary artery calcification is evident. Atherosclerotic calcification is noted in the wall of the thoracic aorta. Right Port-A-Cath tip is positioned in the distal SVC. Mediastinum/Nodes: No mediastinal lymphadenopathy. No evidence for gross hilar lymphadenopathy although assessment is limited by the lack of intravenous contrast on today's study. The esophagus has normal imaging features. There is no axillary lymphadenopathy. Lungs/Pleura: Left upper lobe scarring is unchanged including 8 mm nodular component on 50/series 4.  Irregular subpleural nodular opacity in the right upper lobe is unchanged. 10 mm ground-glass opacity posterior right lower lobe is new in the interval (85/4) several additional tiny scattered bilateral pulmonary nodules are stable. No focal airspace consolidation. Insert no effusions Musculoskeletal: No worrisome lytic or sclerotic osseous abnormality. CT ABDOMEN PELVIS FINDINGS Hepatobiliary: No focal abnormality in the liver on this study without intravenous contrast. There is no evidence for gallstones, gallbladder wall thickening, or pericholecystic fluid. No intrahepatic or extrahepatic biliary dilation. Pancreas: No focal mass lesion. No dilatation of the main duct. No intraparenchymal cyst. No peripancreatic edema. Spleen: No splenomegaly. No focal mass lesion. Adrenals/Urinary Tract: No adrenal nodule or mass. Kidneys unremarkable on this noncontrast study. No evidence for hydroureter. The urinary bladder appears normal for the degree of distention. Stomach/Bowel: Stomach decompressed. Duodenum is normally positioned as is the ligament of Treitz. Duodenal diverticulum noted. No small bowel wall thickening. No small bowel dilatation. The terminal ileum is normal. The appendix is normal. No gross colonic mass. No colonic wall thickening.  Vascular/Lymphatic: There is abdominal aortic atherosclerosis without aneurysm. There is no gastrohepatic or hepatoduodenal ligament lymphadenopathy. No retroperitoneal or mesenteric lymphadenopathy. No pelvic sidewall lymphadenopathy. Reproductive: The uterus is surgically absent. There is no adnexal mass. Other: No intraperitoneal free fluid. Musculoskeletal: No worrisome lytic or sclerotic osseous abnormality. Degenerative changes noted lumbar spine. IMPRESSION: 1. Stable exam. No new or progressive findings to suggest recurrent or metastatic disease in the chest, abdomen, or pelvis. 2. 10 mm ground-glass opacity posterior right lower lobe is new in the interval. This is likely infectious/inflammatory. Attention on follow-up recommended. 3. Aortic Atherosclerosis (ICD10-I70.0). Electronically Signed   By: Misty Stanley M.D.   On: 01/31/2020 09:18   CT Chest Wo Contrast  Result Date: 01/31/2020 CLINICAL DATA:  Non-small-cell lung cancer.  Restaging. EXAM: CT CHEST, ABDOMEN AND PELVIS WITHOUT CONTRAST TECHNIQUE: Multidetector CT imaging of the chest, abdomen and pelvis was performed following the standard protocol without IV contrast. COMPARISON:  11/26/2019 FINDINGS: CT CHEST FINDINGS Cardiovascular: The heart size is normal. No substantial pericardial effusion. Coronary artery calcification is evident. Atherosclerotic calcification is noted in the wall of the thoracic aorta. Right Port-A-Cath tip is positioned in the distal SVC. Mediastinum/Nodes: No mediastinal lymphadenopathy. No evidence for gross hilar lymphadenopathy although assessment is limited by the lack of intravenous contrast on today's study. The esophagus has normal imaging features. There is no axillary lymphadenopathy. Lungs/Pleura: Left upper lobe scarring is unchanged including 8 mm nodular component on 50/series 4. Irregular subpleural nodular opacity in the right upper lobe is unchanged. 10 mm ground-glass opacity posterior right lower  lobe is new in the interval (85/4) several additional tiny scattered bilateral pulmonary nodules are stable. No focal airspace consolidation. Insert no effusions Musculoskeletal: No worrisome lytic or sclerotic osseous abnormality. CT ABDOMEN PELVIS FINDINGS Hepatobiliary: No focal abnormality in the liver on this study without intravenous contrast. There is no evidence for gallstones, gallbladder wall thickening, or pericholecystic fluid. No intrahepatic or extrahepatic biliary dilation. Pancreas: No focal mass lesion. No dilatation of the main duct. No intraparenchymal cyst. No peripancreatic edema. Spleen: No splenomegaly. No focal mass lesion. Adrenals/Urinary Tract: No adrenal nodule or mass. Kidneys unremarkable on this noncontrast study. No evidence for hydroureter. The urinary bladder appears normal for the degree of distention. Stomach/Bowel: Stomach decompressed. Duodenum is normally positioned as is the ligament of Treitz. Duodenal diverticulum noted. No small bowel wall thickening. No small bowel dilatation. The terminal ileum is normal. The appendix is normal. No  gross colonic mass. No colonic wall thickening. Vascular/Lymphatic: There is abdominal aortic atherosclerosis without aneurysm. There is no gastrohepatic or hepatoduodenal ligament lymphadenopathy. No retroperitoneal or mesenteric lymphadenopathy. No pelvic sidewall lymphadenopathy. Reproductive: The uterus is surgically absent. There is no adnexal mass. Other: No intraperitoneal free fluid. Musculoskeletal: No worrisome lytic or sclerotic osseous abnormality. Degenerative changes noted lumbar spine. IMPRESSION: 1. Stable exam. No new or progressive findings to suggest recurrent or metastatic disease in the chest, abdomen, or pelvis. 2. 10 mm ground-glass opacity posterior right lower lobe is new in the interval. This is likely infectious/inflammatory. Attention on follow-up recommended. 3. Aortic Atherosclerosis (ICD10-I70.0). Electronically  Signed   By: Misty Stanley M.D.   On: 01/31/2020 09:18   MR BRAIN W WO CONTRAST  Result Date: 02/14/2020 CLINICAL DATA:  History of metastatic small cell lung cancer. Post whole-brain radiation. EXAM: MRI HEAD WITHOUT AND WITH CONTRAST TECHNIQUE: Multiplanar, multiecho pulse sequences of the brain and surrounding structures were obtained without and with intravenous contrast. CONTRAST:  64m MULTIHANCE GADOBENATE DIMEGLUMINE 529 MG/ML IV SOLN COMPARISON:  MRI head 10/05/2019 FINDINGS: Brain: 2 mm enhancing lesion left inferolateral cerebellum unchanged from multiple prior studies including 12/22/2018. Possible small vessel versus treated tumor. No other enhancing lesions identified. No edema. Negative for acute infarct. Few small white matter hyperintensities unchanged. Negative for hemorrhage or hydrocephalus. Encephalomalacia in the inferior cerebellum bilaterally left greater than right is unchanged. Vascular: Normal arterial flow voids Skull and upper cervical spine: No suspicious skeletal lesions. ACDF cervical spine. Sinuses/Orbits: Mucosal edema paranasal sinuses.  Negative orbit Other: None IMPRESSION: 2 mm enhancing left inferolateral cerebellum unchanged from multiple prior studies. This may represent a vessel or a treated metastasis which is stable. Otherwise no enhancing brain lesions identified. Electronically Signed   By: CFranchot GalloM.D.   On: 02/14/2020 10:55     ASSESSMENT/PLAN:  This is a very pleasant 65year old African-American female with stage IV (T1c, N1,M1c)non-small celllung cancer, adenocarcinoma. She presented with a dominant left upper lobe lung nodule in addition to a right upper lobe pulmonary nodule with left hilar lymphadenopathy. She also has metastatic disease to the brain.She has no actionable mutations and her PDL1 expression is 1%. She was diagnosed in July 2020. She also has KRASG12C which is a targetable mutation that can be used in the second line setting.    The patient completed SBRT to the metastatic brain lesion under the care of Dr. SIsidore Moosin August 2020.   The patient is currently undergoing systemic chemotherapy withcarboplatin for an AUC of 5, Alimta 500 mg/m, and Keytruda 200 mg IV every 3 weeks. She is status post24cyclesof treatment. Starting from cycle #5, she has been on maintenance Alimta 500 mg/m2 and Keytruda 200 mg IV.She tolerated her treatment well without anyconcerningadverse side effects.Alimta wasdiscontinued due to renal insufficiency.   The patient recently had a restaging CT scan performed. Dr. MJulien Nordmannpersonally and independently reviewed the scan and discussed the results with the patient. The scan showed no evidence for disease progression. Dr. MJulien Nordmannrecommends that she continue on the same treatment at the same dose.   We will see her back for a follow up visit in 3 weeks for evaluation before starting cycle #26.   The patient was advised to call immediately if she has any concerning symptoms in the interval. The patient voices understanding of current disease status and treatment options and is in agreement with the current care plan. All questions were answered. The patient knows to call the  clinic with any problems, questions or concerns. We can certainly see the patient much sooner if necessary  No orders of the defined types were placed in this encounter.   Asjia Berrios L Allisson Schindel, PA-C 02/21/20  ADDENDUM: Hematology/Oncology Attending: I had a face-to-face encounter with the patient today.  I recommended her care plan.  This is a very pleasant 65 years old white female diagnosed with stage IV non-small cell lung cancer, adenocarcinoma in July 2020 with PD-L1 expression of 1% and K-ras G12C mutation. The patient started systemic chemotherapy with carboplatin, Alimta and Keytruda for 4 cycles followed by maintenance treatment initially with Alimta and Keytruda but the last few cycles she has been  on treatment with single agent Keytruda because of her renal insufficiency status post total of 24 cycles.   The patient has been tolerating this treatment well with no concerning adverse effects. She had repeat CT scan of the chest, abdomen pelvis performed recently.  I personally and independently reviewed the scan and discussed the results with the patient today. Her scan showed no concerning findings for disease progression. I recommended for her to continue her current maintenance treatment with single agent Keytruda for now. The patient will come back for follow-up visit in 3 weeks for evaluation before the next cycle of her treatment. For the visual disturbances, she had a recent MRI of the brain that was not concerning for any disease progression.  She will follow-up with her ophthalmologist for further recommendation. The patient was advised to call immediately if she has any other concerning symptoms in the interval.  Disclaimer: This note was dictated with voice recognition software. Similar sounding words can inadvertently be transcribed and may be missed upon review. Eilleen Kempf, MD 02/21/20

## 2020-02-19 ENCOUNTER — Other Ambulatory Visit: Payer: Self-pay

## 2020-02-19 ENCOUNTER — Inpatient Hospital Stay: Payer: Medicare Other | Attending: Physician Assistant | Admitting: Internal Medicine

## 2020-02-19 VITALS — BP 152/59 | HR 62 | Temp 97.0°F | Resp 18 | Ht 61.0 in | Wt 182.8 lb

## 2020-02-19 DIAGNOSIS — C7931 Secondary malignant neoplasm of brain: Secondary | ICD-10-CM | POA: Insufficient documentation

## 2020-02-19 DIAGNOSIS — Z803 Family history of malignant neoplasm of breast: Secondary | ICD-10-CM | POA: Insufficient documentation

## 2020-02-19 DIAGNOSIS — Z87891 Personal history of nicotine dependence: Secondary | ICD-10-CM | POA: Diagnosis not present

## 2020-02-19 DIAGNOSIS — R519 Headache, unspecified: Secondary | ICD-10-CM | POA: Diagnosis not present

## 2020-02-19 DIAGNOSIS — C3412 Malignant neoplasm of upper lobe, left bronchus or lung: Secondary | ICD-10-CM | POA: Insufficient documentation

## 2020-02-19 DIAGNOSIS — Z5112 Encounter for antineoplastic immunotherapy: Secondary | ICD-10-CM | POA: Insufficient documentation

## 2020-02-19 DIAGNOSIS — Z780 Asymptomatic menopausal state: Secondary | ICD-10-CM | POA: Diagnosis not present

## 2020-02-19 DIAGNOSIS — Z79899 Other long term (current) drug therapy: Secondary | ICD-10-CM | POA: Insufficient documentation

## 2020-02-19 NOTE — Progress Notes (Signed)
[facility] [place], [place] [address] [phone]   Interval Evaluation  Date of Service: 02/19/20 Patient Name: Carolyn [name] Patient MRN: [id number] Patient DOB: [birth year] Provider: Ventura Sellers, MD  Identifying Statement:  Carolyn [name] is a 65 y.o. female with Brain metastases ([place]) [C79.31]   Primary Cancer:  Oncologic History: Oncology History  Adenocarcinoma, lung ([place])  09/12/2018 Initial Diagnosis   Adenocarcinoma, lung ([facility])   10/05/2018 -  Chemotherapy   The patient had palonosetron (ALOXI) injection 0.25 mg, 0.25 mg, Intravenous,  Once, 4 of 4 cycles Administration: 0.25 mg (10/05/2018), 0.25 mg (11/16/2018), 0.25 mg (12/07/2018), 0.25 mg (10/26/2018) PEMEtrexed (ALIMTA) 900 mg in sodium chloride 0.9 % 100 mL chemo infusion, 485 mg/m2 = 925 mg, Intravenous,  Once, 8 of 8 cycles Administration: 900 mg (10/05/2018), 900 mg (11/16/2018), 900 mg (12/07/2018), 900 mg (12/28/2018), 900 mg (01/18/2019), 900 mg (10/26/2018), 900 mg (02/08/2019), 900 mg (03/01/2019) CARBOplatin (PARAPLATIN) 440 mg in sodium chloride 0.9 % 250 mL chemo infusion, 440 mg (100 % of original dose 442.5 mg), Intravenous,  Once, 4 of 4 cycles Dose modification: 442.5 mg (original dose 442.5 mg, Cycle 1), 480.5 mg (original dose 480.5 mg, Cycle 3), 467 mg (original dose 467 mg, Cycle 4), 484 mg (original dose 484 mg, Cycle 2) Administration: 440 mg (10/05/2018), 480 mg (11/16/2018), 470 mg (12/07/2018), 480 mg (10/26/2018) pembrolizumab (KEYTRUDA) 200 mg in sodium chloride 0.9 % 50 mL chemo infusion, 200 mg, Intravenous, Once, 24 of 28 cycles Administration: 200 mg (10/05/2018), 200 mg (11/16/2018), 200 mg (12/07/2018), 200 mg (12/28/2018), 200 mg (01/18/2019), 200 mg (10/26/2018), 200 mg (02/08/2019), 200 mg (03/01/2019), 200 mg (03/22/2019), 200 mg (04/12/2019), 200 mg (05/03/2019), 200 mg (05/24/2019), 200 mg (06/14/2019), 200 mg (07/04/2019), 200 mg  (07/26/2019), 200 mg (08/16/2019), 200 mg (09/06/2019), 200 mg (09/27/2019), 200 mg (10/18/2019), 200 mg (11/08/2019), 200 mg (11/29/2019), 200 mg (12/20/2019), 200 mg (01/10/2020), 200 mg (01/30/2020) fosaprepitant (EMEND) 150 mg, dexamethasone (DECADRON) 12 mg in sodium chloride 0.9 % 145 mL IVPB, , Intravenous,  Once, 4 of 4 cycles Administration:  (10/05/2018),  (11/16/2018),  (12/07/2018),  (10/26/2018)  for chemotherapy treatment.     CNS Oncologic History: 09/22/18: Completes SRS to 9 sub-cm metastases Isidore Moos)  Interval History:  Carolyn [name] presents today for follow up after recent MRI brain.  No new or progressive neurologic deficits.  Headaches have been sporadic and self limited. Remains phsyically and mentally active. Continues with Keytruda through Dr. Julien Nordmann.  Medications: Current Outpatient Medications on File Prior to Visit  Medication Sig Dispense Refill  . acetaminophen (TYLENOL) 500 MG tablet Take 1,000 mg by mouth every 8 (eight) hours as needed for mild pain, fever or headache.     Marland Kitchen amLODipine (NORVASC) 5 MG tablet Take 1 tablet by mouth once daily 90 tablet 2  . aspirin EC 81 MG tablet Take 81 mg by mouth daily.    . carvedilol (COREG) 25 MG tablet Take 1/2 (one-half) tablet by mouth twice daily 180 tablet 0  . cetirizine (ZYRTEC) 10 MG tablet Take 10 mg by mouth daily.     . Cholecalciferol (VITAMIN D-3) 125 MCG (5000 UT) TABS Take 5,000 Units by mouth daily.    . Chromium Picolinate 1000 MCG TABS Take 1,000 mg by mouth daily.    . famotidine (PEPCID) 20 MG tablet Take 20 mg by mouth 2 (two) times daily.    . Lancets ([place]  LANCET33G) MISC SMARTSIG:1 Topical 3 Times Daily    . lidocaine-prilocaine (EMLA) cream Apply to the Port-A-Cath site 30 to 60 minutes before chemotherapy. 30 g 2  . metFORMIN (GLUCOPHAGE) 500 MG tablet Take 500 mg by mouth 2 (two) times daily with a meal.     . methocarbamol (ROBAXIN) 500 MG tablet Take 1 tablet (500 mg total) by mouth  every 6 (six) hours as needed for muscle spasms. (Patient not taking: Reported on 11/12/2019) 21 tablet 0  . nitroGLYCERIN (NITROSTAT) 0.4 MG SL tablet Place 1 tablet (0.4 mg total) under the tongue every 5 (five) minutes as needed for chest pain. 25 tablet 2  . ONETOUCH ULTRA test strip USE 1 STRIP TO CHECK GLUCOSE THREE TIMES DAILY    . Pitavastatin Calcium (LIVALO) 4 MG TABS Take 4 mg by mouth at bedtime.     Vladimir Faster Glycol-Propyl Glycol (SYSTANE) 0.4-0.3 % SOLN Place 1 drop into both eyes every 6 (six) hours as needed (dry eyes).     . prochlorperazine (COMPAZINE) 10 MG tablet Take 1 tablet (10 mg total) by mouth every 6 (six) hours as needed for nausea or vomiting. (Patient not taking: Reported on 11/12/2019) 30 tablet 2   No current facility-administered medications on file prior to visit.    Allergies:  Allergies  Allergen Reactions  . Lisinopril Swelling    Lips and face swelled up.   Past Medical History:  Past Medical History:  Diagnosis Date  . Anginal pain ([facility])    " with exertion "  . Arthritis    " 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 "  . Asthma    h/o  . Coronary artery disease   . Diabetes mellitus without complication ([facility])    Type II  . GERD (gastroesophageal reflux disease)   . H/O hiatal hernia   . Heart murmur   . History of radiation therapy 09/22/2018   stereotactic radiosurgery   . 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 Hypertension   . nscl ca dx'd 08/07/18  . Persistent headaches    h/o  . Pneumonia    hx of PNA  . Shortness of breath   . Vaginal dryness    h/o   Past Surgical History:  Past Surgical History:  Procedure Laterality Date  . ABDOMINAL HYSTERECTOMY    . bone spur    . CARDIAC CATHETERIZATION  06/13/2012  . carpel tunnel surgery Left   . COLONOSCOPY     9 years ago  . CORONARY ANGIOPLASTY  06/13/2012  . EYE SURGERY     cyst left eye   . IR IMAGING GUIDED PORT INSERTION  10/10/2018  . LEFT HEART CATHETERIZATION WITH CORONARY ANGIOGRAM N/A 06/13/2012   Procedure:  LEFT HEART CATHETERIZATION WITH CORONARY ANGIOGRAM;  Surgeon: Laverda Page, MD;  Location: [facility] CATH LAB;  Service: Cardiovascular;  Laterality: N/A;  . NECK SURGERY    . rotator cuff surgery Right 2015  . VIDEO BRONCHOSCOPY WITH ENDOBRONCHIAL NAVIGATION N/A 09/06/2018   Procedure: VIDEO BRONCHOSCOPY WITH ENDOBRONCHIAL NAVIGATION, left upper lung;  Surgeon: Lajuana Matte, MD;  Location: [facility];  Service: Thoracic;  Laterality: N/A;  . VIDEO BRONCHOSCOPY WITH ENDOBRONCHIAL ULTRASOUND Left 09/06/2018   Procedure: VIDEO BRONCHOSCOPY WITH ENDOBRONCHIAL ULTRASOUND, left lung;  Surgeon: Lajuana Matte, MD;  Location: [facility];  Service: Thoracic;  Laterality: Left;  . WISDOM TOOTH EXTRACTION     Social History:  Social History   Socioeconomic History  . Marital status: Married  Spouse name: Not on file  . Number of children: 3  . Years of education: Not on file  . Highest education level: Not on file  Occupational History  . Not on file  Tobacco Use  . Smoking status: Former Smoker    Packs/day: 0.50    Years: 30.00    Pack years: 15.00    Quit date: 02/09/1999    Years since quitting: 21.0  . Smokeless tobacco: Never Used  Vaping Use  . Vaping Use: Never used  Substance and Sexual Activity  . Alcohol use: Not Currently  . Drug use: Not Currently  . Sexual activity: Yes    Birth control/protection: Post-menopausal  Other Topics Concern  . Not on file  Social History Narrative  . Not on file   Social Determinants of Health   Financial Resource Strain: Not on file  Food Insecurity: Not on file  Transportation Needs: Not on file  Physical Activity: Not on file  Stress: Not on file  Social Connections: Not on file  Intimate Partner Violence: Not on file   Family History:  Family History  Problem Relation Age of Onset  . Breast cancer Other   . Diabetes Mother   . Glaucoma Mother   . Hypertension Mother   . Hypertension Father   . Asthma Father   . Diabetes  Sister   . Diabetes Brother   . Hypertension Sister     Review of Systems: Constitutional: Doesn't report fevers, chills or abnormal weight loss Eyes: Doesn't report blurriness of vision Ears, nose, mouth, throat, and face: Doesn't report sore throat Respiratory: Doesn't report cough, dyspnea or wheezes Cardiovascular: Doesn't report palpitation, chest discomfort  Gastrointestinal:  Doesn't report nausea, constipation, diarrhea GU: Doesn't report incontinence Skin: Doesn't report skin rashes Neurological: Per HPI Musculoskeletal: Doesn't report joint pain Behavioral/Psych: Doesn't report anxiety  Physical Exam: Vitals:   02/19/20 0921  BP: (!) 152/59  Pulse: 62  Resp: 18  Temp: (!) 97 F (36.1 C)  SpO2: 100%   KPS: 90. General: Alert, cooperative, pleasant, in no acute distress Head: Normal EENT: No conjunctival injection or scleral icterus.  Lungs: Resp effort normal Cardiac: Regular rate Abdomen: Non-distended abdomen Skin: No rashes cyanosis or petechiae. Extremities: No clubbing or edema  Neurologic Exam: Mental Status: Awake, alert, attentive to examiner. Oriented to self and environment. Language is fluent with intact comprehension.  Cranial Nerves: Visual acuity is grossly normal. Visual fields are full. Extra-ocular movements intact. No ptosis. Face is symmetric Motor: Tone and bulk are normal. Power is full in both arms and legs. Reflexes are symmetric, no pathologic reflexes present.  Sensory: Intact to light touch Gait: Normal.   Labs: I have reviewed the data as listed    Component Value Date/Time   NA 140 01/30/2020 0801   K 3.9 01/30/2020 0801   CL 107 01/30/2020 0801   CO2 24 01/30/2020 0801   GLUCOSE 151 (H) 01/30/2020 0801   BUN 20 01/30/2020 0801   CREATININE 1.71 (H) 01/30/2020 0801   CALCIUM 8.9 01/30/2020 0801   PROT 6.7 01/30/2020 0801   ALBUMIN 3.3 (L) 01/30/2020 0801   AST 18 01/30/2020 0801   ALT 11 01/30/2020 0801   ALKPHOS 62  01/30/2020 0801   BILITOT 0.3 01/30/2020 0801   GFRNONAA 33 (L) 01/30/2020 0801   GFRAA 37 (L) 11/12/2019 0917   GFRAA 37 (L) 11/08/2019 0900   Lab Results  Component Value Date   WBC 5.9 01/30/2020   NEUTROABS 3.5  01/30/2020   HGB 10.3 (L) 01/30/2020   HCT 33.6 (L) 01/30/2020   MCV 80.0 01/30/2020   PLT 224 01/30/2020    Imaging:  CT Abdomen Pelvis Wo Contrast  Result Date: 01/31/2020 CLINICAL DATA:  Non-small-cell lung cancer.  Restaging. EXAM: CT CHEST, ABDOMEN AND PELVIS WITHOUT CONTRAST TECHNIQUE: Multidetector CT imaging of the chest, abdomen and pelvis was performed following the standard protocol without IV contrast. COMPARISON:  11/26/2019 FINDINGS: CT CHEST FINDINGS Cardiovascular: The heart size is normal. No substantial pericardial effusion. Coronary artery calcification is evident. Atherosclerotic calcification is noted in the wall of the thoracic aorta. Right Port-A-Cath tip is positioned in the distal SVC. Mediastinum/Nodes: No mediastinal lymphadenopathy. No evidence for gross hilar lymphadenopathy although assessment is limited by the lack of intravenous contrast on today's study. The esophagus has normal imaging features. There is no axillary lymphadenopathy. Lungs/Pleura: Left upper lobe scarring is unchanged including 8 mm nodular component on 50/series 4. Irregular subpleural nodular opacity in the right upper lobe is unchanged. 10 mm ground-glass opacity posterior right lower lobe is new in the interval (85/4) several additional tiny scattered bilateral pulmonary nodules are stable. No focal airspace consolidation. Insert no effusions Musculoskeletal: No worrisome lytic or sclerotic osseous abnormality. CT ABDOMEN PELVIS FINDINGS Hepatobiliary: No focal abnormality in the liver on this study without intravenous contrast. There is no evidence for gallstones, gallbladder wall thickening, or pericholecystic fluid. No intrahepatic or extrahepatic biliary dilation. Pancreas: No  focal mass lesion. No dilatation of the main duct. No intraparenchymal cyst. No peripancreatic edema. Spleen: No splenomegaly. No focal mass lesion. Adrenals/Urinary Tract: No adrenal nodule or mass. Kidneys unremarkable on this noncontrast study. No evidence for hydroureter. The urinary bladder appears normal for the degree of distention. Stomach/Bowel: Stomach decompressed. Duodenum is normally positioned as is the ligament of Treitz. Duodenal diverticulum noted. No small bowel wall thickening. No small bowel dilatation. The terminal ileum is normal. The appendix is normal. No gross colonic mass. No colonic wall thickening. Vascular/Lymphatic: There is abdominal aortic atherosclerosis without aneurysm. There is no gastrohepatic or hepatoduodenal ligament lymphadenopathy. No retroperitoneal or mesenteric lymphadenopathy. No pelvic sidewall lymphadenopathy. Reproductive: The uterus is surgically absent. There is no adnexal mass. Other: No intraperitoneal free fluid. Musculoskeletal: No worrisome lytic or sclerotic osseous abnormality. Degenerative changes noted lumbar spine. IMPRESSION: 1. Stable exam. No new or progressive findings to suggest recurrent or metastatic disease in the chest, abdomen, or pelvis. 2. 10 mm ground-glass opacity posterior right lower lobe is new in the interval. This is likely infectious/inflammatory. Attention on follow-up recommended. 3. Aortic Atherosclerosis (ICD10-I70.0). Electronically Signed   By: Misty Stanley M.D.   On: 01/31/2020 09:18   CT Chest Wo Contrast  Result Date: 01/31/2020 CLINICAL DATA:  Non-small-cell lung cancer.  Restaging. EXAM: CT CHEST, ABDOMEN AND PELVIS WITHOUT CONTRAST TECHNIQUE: Multidetector CT imaging of the chest, abdomen and pelvis was performed following the standard protocol without IV contrast. COMPARISON:  11/26/2019 FINDINGS: CT CHEST FINDINGS Cardiovascular: The heart size is normal. No substantial pericardial effusion. Coronary artery  calcification is evident. Atherosclerotic calcification is noted in the wall of the thoracic aorta. Right Port-A-Cath tip is positioned in the distal SVC. Mediastinum/Nodes: No mediastinal lymphadenopathy. No evidence for gross hilar lymphadenopathy although assessment is limited by the lack of intravenous contrast on today's study. The esophagus has normal imaging features. There is no axillary lymphadenopathy. Lungs/Pleura: Left upper lobe scarring is unchanged including 8 mm nodular component on 50/series 4. Irregular subpleural nodular opacity in the right  upper lobe is unchanged. 10 mm ground-glass opacity posterior right lower lobe is new in the interval (85/4) several additional tiny scattered bilateral pulmonary nodules are stable. No focal airspace consolidation. Insert no effusions Musculoskeletal: No worrisome lytic or sclerotic osseous abnormality. CT ABDOMEN PELVIS FINDINGS Hepatobiliary: No focal abnormality in the liver on this study without intravenous contrast. There is no evidence for gallstones, gallbladder wall thickening, or pericholecystic fluid. No intrahepatic or extrahepatic biliary dilation. Pancreas: No focal mass lesion. No dilatation of the main duct. No intraparenchymal cyst. No peripancreatic edema. Spleen: No splenomegaly. No focal mass lesion. Adrenals/Urinary Tract: No adrenal nodule or mass. Kidneys unremarkable on this noncontrast study. No evidence for hydroureter. The urinary bladder appears normal for the degree of distention. Stomach/Bowel: Stomach decompressed. Duodenum is normally positioned as is the ligament of Treitz. Duodenal diverticulum noted. No small bowel wall thickening. No small bowel dilatation. The terminal ileum is normal. The appendix is normal. No gross colonic mass. No colonic wall thickening. Vascular/Lymphatic: There is abdominal aortic atherosclerosis without aneurysm. There is no gastrohepatic or hepatoduodenal ligament lymphadenopathy. No  retroperitoneal or mesenteric lymphadenopathy. No pelvic sidewall lymphadenopathy. Reproductive: The uterus is surgically absent. There is no adnexal mass. Other: No intraperitoneal free fluid. Musculoskeletal: No worrisome lytic or sclerotic osseous abnormality. Degenerative changes noted lumbar spine. IMPRESSION: 1. Stable exam. No new or progressive findings to suggest recurrent or metastatic disease in the chest, abdomen, or pelvis. 2. 10 mm ground-glass opacity posterior right lower lobe is new in the interval. This is likely infectious/inflammatory. Attention on follow-up recommended. 3. Aortic Atherosclerosis (ICD10-I70.0). Electronically Signed   By: Misty Stanley M.D.   On: 01/31/2020 09:18   MR BRAIN W WO CONTRAST  Result Date: 02/14/2020 CLINICAL DATA:  History of metastatic small cell lung cancer. Post whole-brain radiation. EXAM: MRI HEAD WITHOUT AND WITH CONTRAST TECHNIQUE: Multiplanar, multiecho pulse sequences of the brain and surrounding structures were obtained without and with intravenous contrast. CONTRAST:  87mL MULTIHANCE GADOBENATE DIMEGLUMINE 529 MG/ML IV SOLN COMPARISON:  MRI head 10/05/2019 FINDINGS: Brain: 2 mm enhancing lesion left inferolateral cerebellum unchanged from multiple prior studies including 12/22/2018. Possible small vessel versus treated tumor. No other enhancing lesions identified. No edema. Negative for acute infarct. Few small white matter hyperintensities unchanged. Negative for hemorrhage or hydrocephalus. Encephalomalacia in the inferior cerebellum bilaterally left greater than right is unchanged. Vascular: Normal arterial flow voids Skull and upper cervical spine: No suspicious skeletal lesions. ACDF cervical spine. Sinuses/Orbits: Mucosal edema paranasal sinuses.  Negative orbit Other: None IMPRESSION: 2 mm enhancing left inferolateral cerebellum unchanged from multiple prior studies. This may represent a vessel or a treated metastasis which is stable. Otherwise  no enhancing brain lesions identified. Electronically Signed   By: Franchot Gallo M.D.   On: 02/14/2020 10:55    [facility] Clinician Interpretation: I have personally reviewed the radiological images as listed.  My interpretation, in the context of the patient's clinical presentation, is stable disease   Assessment/Plan Brain metastases ([place]) [C79.31]  Joanne Chars Cumpian is clinicallly and radiographically stable today.  No new or progressive neurologic deficits.  We ask that [name] return to clinic in 6 months following next brain MRI, or sooner as needed.  May treat headaches with PRN Tylenol as prior.  We appreciate the opportunity to participate in the care of [name].   All questions were answered. The patient knows to call the clinic with any problems, questions or concerns. No barriers to learning were detected.  I have spent a total of 30 minutes of face-to-face and non-face-to-face time, excluding clinical staff time, preparing to see patient, ordering tests and/or medications, counseling the patient, and independently interpreting results and communicating results to the patient/family/caregiver    Ventura Sellers, MD Medical Director of Neuro-Oncology [facility] [place] 02/19/20 9:25 AM

## 2020-02-20 ENCOUNTER — Other Ambulatory Visit: Payer: Self-pay | Admitting: Radiation Therapy

## 2020-02-20 DIAGNOSIS — J309 Allergic rhinitis, unspecified: Secondary | ICD-10-CM | POA: Diagnosis not present

## 2020-02-21 ENCOUNTER — Inpatient Hospital Stay: Payer: Medicare Other

## 2020-02-21 ENCOUNTER — Other Ambulatory Visit: Payer: Self-pay

## 2020-02-21 ENCOUNTER — Encounter: Payer: Self-pay | Admitting: Physician Assistant

## 2020-02-21 ENCOUNTER — Inpatient Hospital Stay (HOSPITAL_BASED_OUTPATIENT_CLINIC_OR_DEPARTMENT_OTHER): Payer: Medicare Other | Admitting: Physician Assistant

## 2020-02-21 VITALS — BP 147/66 | HR 65 | Temp 96.2°F | Resp 17 | Ht 61.0 in | Wt 182.5 lb

## 2020-02-21 DIAGNOSIS — C7931 Secondary malignant neoplasm of brain: Secondary | ICD-10-CM | POA: Diagnosis not present

## 2020-02-21 DIAGNOSIS — R519 Headache, unspecified: Secondary | ICD-10-CM | POA: Diagnosis not present

## 2020-02-21 DIAGNOSIS — C3491 Malignant neoplasm of unspecified part of right bronchus or lung: Secondary | ICD-10-CM

## 2020-02-21 DIAGNOSIS — R5383 Other fatigue: Secondary | ICD-10-CM

## 2020-02-21 DIAGNOSIS — Z79899 Other long term (current) drug therapy: Secondary | ICD-10-CM | POA: Diagnosis not present

## 2020-02-21 DIAGNOSIS — C3492 Malignant neoplasm of unspecified part of left bronchus or lung: Secondary | ICD-10-CM

## 2020-02-21 DIAGNOSIS — C3412 Malignant neoplasm of upper lobe, left bronchus or lung: Secondary | ICD-10-CM | POA: Diagnosis not present

## 2020-02-21 DIAGNOSIS — Z5112 Encounter for antineoplastic immunotherapy: Secondary | ICD-10-CM | POA: Diagnosis not present

## 2020-02-21 DIAGNOSIS — Z780 Asymptomatic menopausal state: Secondary | ICD-10-CM | POA: Diagnosis not present

## 2020-02-21 LAB — CMP (CANCER CENTER ONLY)
ALT: 13 U/L (ref 0–44)
AST: 21 U/L (ref 15–41)
Albumin: 3.5 g/dL (ref 3.5–5.0)
Alkaline Phosphatase: 71 U/L (ref 38–126)
Anion gap: 8 (ref 5–15)
BUN: 14 mg/dL (ref 8–23)
CO2: 29 mmol/L (ref 22–32)
Calcium: 9.1 mg/dL (ref 8.9–10.3)
Chloride: 106 mmol/L (ref 98–111)
Creatinine: 1.66 mg/dL — ABNORMAL HIGH (ref 0.44–1.00)
GFR, Estimated: 34 mL/min — ABNORMAL LOW (ref >60)
Glucose, Bld: 109 mg/dL — ABNORMAL HIGH (ref 70–99)
Potassium: 3.9 mmol/L (ref 3.5–5.1)
Sodium: 143 mmol/L (ref 135–145)
Total Bilirubin: 0.4 mg/dL (ref 0.3–1.2)
Total Protein: 7 g/dL (ref 6.5–8.1)

## 2020-02-21 LAB — CBC WITH DIFFERENTIAL (CANCER CENTER ONLY)
Abs Immature Granulocytes: 0.03 10*3/uL (ref 0.00–0.07)
Basophils Absolute: 0.1 10*3/uL (ref 0.0–0.1)
Basophils Relative: 1 %
Eosinophils Absolute: 0.6 10*3/uL — ABNORMAL HIGH (ref 0.0–0.5)
Eosinophils Relative: 9 %
HCT: 35.2 % — ABNORMAL LOW (ref 36.0–46.0)
Hemoglobin: 10.7 g/dL — ABNORMAL LOW (ref 12.0–15.0)
Immature Granulocytes: 1 %
Lymphocytes Relative: 25 %
Lymphs Abs: 1.6 10*3/uL (ref 0.7–4.0)
MCH: 24.4 pg — ABNORMAL LOW (ref 26.0–34.0)
MCHC: 30.4 g/dL (ref 30.0–36.0)
MCV: 80.2 fL (ref 80.0–100.0)
Monocytes Absolute: 0.6 10*3/uL (ref 0.1–1.0)
Monocytes Relative: 9 %
Neutro Abs: 3.6 10*3/uL (ref 1.7–7.7)
Neutrophils Relative %: 55 %
Platelet Count: 258 10*3/uL (ref 150–400)
RBC: 4.39 MIL/uL (ref 3.87–5.11)
RDW: 15.8 % — ABNORMAL HIGH (ref 11.5–15.5)
WBC Count: 6.5 10*3/uL (ref 4.0–10.5)
nRBC: 0 % (ref 0.0–0.2)

## 2020-02-21 LAB — TSH: TSH: 2.248 u[IU]/mL (ref 0.308–3.960)

## 2020-02-21 MED ORDER — SODIUM CHLORIDE 0.9% FLUSH
10.0000 mL | INTRAVENOUS | Status: DC | PRN
  Administered 2020-02-21: 10 mL
  Filled 2020-02-21: qty 10

## 2020-02-21 MED ORDER — SODIUM CHLORIDE 0.9 % IV SOLN
200.0000 mg | Freq: Once | INTRAVENOUS | Status: AC
  Administered 2020-02-21: 200 mg via INTRAVENOUS
  Filled 2020-02-21: qty 8

## 2020-02-21 MED ORDER — SODIUM CHLORIDE 0.9 % IV SOLN
Freq: Once | INTRAVENOUS | Status: AC
  Filled 2020-02-21: qty 250

## 2020-02-21 MED ORDER — HEPARIN SOD (PORK) LOCK FLUSH 100 UNIT/ML IV SOLN
500.0000 [IU] | Freq: Once | INTRAVENOUS | Status: AC | PRN
  Administered 2020-02-21: 500 [IU]
  Filled 2020-02-21: qty 5

## 2020-02-21 NOTE — Progress Notes (Signed)
OK to treat today w/ creatinine of 1.66 per C. Heilingoetter PA

## 2020-02-21 NOTE — Patient Instructions (Signed)
[facility] Discharge Instructions for Patients Receiving Chemotherapy  Today you received the following immunotherapy agent: Pembrolizumab Beryle Flock)  To help prevent nausea and vomiting after your treatment, we encourage you to take your nausea medication as directed by your MD.   If you develop nausea and vomiting that is not controlled by your nausea medication, call the clinic.   BELOW ARE SYMPTOMS THAT SHOULD BE REPORTED IMMEDIATELY:  *FEVER GREATER THAN 100.5 F  *CHILLS WITH OR WITHOUT FEVER  NAUSEA AND VOMITING THAT IS NOT CONTROLLED WITH YOUR NAUSEA MEDICATION  *UNUSUAL SHORTNESS OF BREATH  *UNUSUAL BRUISING OR BLEEDING  TENDERNESS IN MOUTH AND THROAT WITH OR WITHOUT PRESENCE OF ULCERS  *URINARY PROBLEMS  *BOWEL PROBLEMS  UNUSUAL RASH Items with * indicate a potential emergency and should be followed up as soon as possible.  Feel free to call the clinic should you have any questions or concerns. The clinic phone number is [phone].  Please show the [place] [facility] at check-in to the Emergency Department and triage nurse.

## 2020-02-25 ENCOUNTER — Telehealth: Payer: Self-pay | Admitting: Physician Assistant

## 2020-02-25 NOTE — Telephone Encounter (Signed)
Scheduled appts per 1/13 los. Pt to get updated appt calendar at next visit per appt notes.

## 2020-02-27 DIAGNOSIS — E119 Type 2 diabetes mellitus without complications: Secondary | ICD-10-CM | POA: Diagnosis not present

## 2020-02-27 DIAGNOSIS — H259 Unspecified age-related cataract: Secondary | ICD-10-CM | POA: Diagnosis not present

## 2020-02-27 DIAGNOSIS — H524 Presbyopia: Secondary | ICD-10-CM | POA: Diagnosis not present

## 2020-02-27 DIAGNOSIS — Z135 Encounter for screening for eye and ear disorders: Secondary | ICD-10-CM | POA: Diagnosis not present

## 2020-03-07 ENCOUNTER — Ambulatory Visit: Admitting: Cardiology

## 2020-03-07 NOTE — Progress Notes (Signed)
[facility] OFFICE PROGRESS NOTE  [name], MD [address] [place] [address]  DIAGNOSIS: Stage IV(T1c, N1, M1 C)non-small cell lung cancer, adenocarcinoma. Shepresented with left upper lobe lung nodule in addition to right upper lobe lung nodule with left hilar lymphadenopathy as well as multiple brain metastasis.She was diagnosed in July of 2020  Biomarker Findings Microsatellite status - MS-Stable Tumor Mutational Burden - 4 Muts/Mb Genomic Findings For a complete list of the genes assayed, please refer to the Appendix. ATM G204* KRAS G12C PDGFRA P122T SMO R199W 7 Disease relevant genes with no reportable alterations: ALK, BRAF, EGFR, ERBB2, MET, RET, ROS1  PDL 1 expressionwas 1%  PRIOR THERAPY: SBRT to multiple brain metastasis under the care of Dr. Isidore Moos.Completed in August 2020  CURRENT THERAPY: Systemic chemotherapy with Carboplatin for an AUC of 5, Alimta 500 mg/m2, and Keytruda 200 mg IV every 3 weeks. Status post25cyclesof treatment. First dose on 10/05/2018.She started maintenance Keytruda and Alimta starting from cycle #5. Alimta was discontinued due to renal insufficiency   INTERVAL HISTORY: Carolyn [name] 65 y.o. female returns the clinic today forafollow-up visit. The patient is feeling well today without any concerning complaints. Her chemotherapy with alimta was discontinued due to renal insuffiencey for which she is followed by nephrology. She tolerated her last cycle of treatment with single agent immunotherapy with Keytruda well without any concerning adverse side effects. The patient denies any recent fever, chills, or weight loss. She reports baseline night sweats.She denies any chest pain, shortness of breath, cough, or hemoptysis. She denies any nausea, vomiting, diarrhea, or constipation. She denies any rashes or skin changes. She denies any visual changes. She has baseline occasional headaches.  She had a  repeat MRI of the brain on 02/14/20. She follows with neuro-oncology. Her scan was stable. She is here today for evaluation before starting cycle #26.     MEDICAL HISTORY: Past Medical History:  Diagnosis Date  . Anginal pain ([place])    " with exertion "  . Arthritis    " 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 "  . Asthma    h/o  . Coronary artery disease   . Diabetes mellitus without complication ([place])    Type II  . GERD (gastroesophageal reflux disease)   . H/O hiatal hernia   . Heart murmur   . History of radiation therapy 09/22/2018   stereotactic radiosurgery   . 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 Hypertension   . nscl ca dx'd 08/07/18  . Persistent headaches    h/o  . Pneumonia    hx of PNA  . Shortness of breath   . Vaginal dryness    h/o    ALLERGIES:  is allergic to lisinopril.  MEDICATIONS:  Current Outpatient Medications  Medication Sig Dispense Refill  . acetaminophen (TYLENOL) 500 MG tablet Take 1,000 mg by mouth every 8 (eight) hours as needed for mild pain, fever or headache.     Marland Kitchen amLODipine (NORVASC) 5 MG tablet Take 1 tablet by mouth once daily 90 tablet 2  . aspirin EC 81 MG tablet Take 81 mg by mouth daily.    . carvedilol (COREG) 25 MG tablet Take 1/2 (one-half) tablet by mouth twice daily 180 tablet 0  . cetirizine (ZYRTEC) 10 MG tablet Take 10 mg by mouth daily.     . Cholecalciferol (VITAMIN D-3) 125 MCG (5000 UT) TABS Take 5,000 Units by mouth daily.    . Chromium Picolinate 1000 MCG  TABS Take 1,000 mg by mouth daily.    . fexofenadine (ALLEGRA) 180 MG tablet 1 tablet swallow whole with water; do not take with fruit juices.    . Lancets (ONETOUCH DELICA PLUS OACZYS06T) MISC SMARTSIG:1 Topical 3 Times Daily    . lidocaine-prilocaine (EMLA) cream Apply to the Port-A-Cath site 30 to 60 minutes before chemotherapy. 30 g 2  . metFORMIN (GLUCOPHAGE) 500 MG tablet Take 500 mg by mouth 2 (two) times daily with a meal.     . methocarbamol (ROBAXIN) 500 MG tablet Take 1 tablet (500 mg total)  by mouth every 6 (six) hours as needed for muscle spasms. 21 tablet 0  . montelukast (SINGULAIR) 10 MG tablet 1 tablet    . nitroGLYCERIN (NITROSTAT) 0.4 MG SL tablet Place 1 tablet (0.4 mg total) under the tongue every 5 (five) minutes as needed for chest pain. 25 tablet 2  . ONETOUCH ULTRA test strip USE 1 STRIP TO CHECK GLUCOSE THREE TIMES DAILY    . Pitavastatin Calcium (LIVALO) 4 MG TABS Take 4 mg by mouth at bedtime.     Vladimir Faster Glycol-Propyl Glycol 0.4-0.3 % SOLN Place 1 drop into both eyes every 6 (six) hours as needed (dry eyes).     . prochlorperazine (COMPAZINE) 10 MG tablet Take 1 tablet (10 mg total) by mouth every 6 (six) hours as needed for nausea or vomiting. 30 tablet 2   No current facility-administered medications for this visit.    SURGICAL HISTORY:  Past Surgical History:  Procedure Laterality Date  . ABDOMINAL HYSTERECTOMY    . bone spur    . CARDIAC CATHETERIZATION  06/13/2012  . carpel tunnel surgery Left   . COLONOSCOPY     9 years ago  . CORONARY ANGIOPLASTY  06/13/2012  . EYE SURGERY     cyst left eye   . IR IMAGING GUIDED PORT INSERTION  10/10/2018  . LEFT HEART CATHETERIZATION WITH CORONARY ANGIOGRAM N/A 06/13/2012   Procedure: LEFT HEART CATHETERIZATION WITH CORONARY ANGIOGRAM;  Surgeon: Laverda Page, MD;  Location: [facility] CATH LAB;  Service: Cardiovascular;  Laterality: N/A;  . NECK SURGERY    . rotator cuff surgery Right 2015  . VIDEO BRONCHOSCOPY WITH ENDOBRONCHIAL NAVIGATION N/A 09/06/2018   Procedure: VIDEO BRONCHOSCOPY WITH ENDOBRONCHIAL NAVIGATION, left upper lung;  Surgeon: Lajuana Matte, MD;  Location: [facility];  Service: Thoracic;  Laterality: N/A;  . VIDEO BRONCHOSCOPY WITH ENDOBRONCHIAL ULTRASOUND Left 09/06/2018   Procedure: VIDEO BRONCHOSCOPY WITH ENDOBRONCHIAL ULTRASOUND, left lung;  Surgeon: Lajuana Matte, MD;  Location: [facility];  Service: Thoracic;  Laterality: Left;  . WISDOM TOOTH EXTRACTION      REVIEW OF SYSTEMS:   Review of  Systems  Constitutional: Negative for appetite change, chills, fatigue, fever and unexpected weight change.  HENT:   Negative for mouth sores, nosebleeds, sore throat and trouble swallowing.   Eyes: Negative for eye problems and icterus.  Respiratory: Negative for cough, hemoptysis, shortness of breath and wheezing.   Cardiovascular: Negative for chest pain and leg swelling.  Gastrointestinal: Negative for abdominal pain, constipation, diarrhea, nausea and vomiting.  Genitourinary: Negative for bladder incontinence, difficulty urinating, dysuria, frequency and hematuria.   Musculoskeletal: Negative for back pain, gait problem, neck pain and neck stiffness.  Skin: Negative for itching and rash.  Neurological: Positive for occasional headaches. Negative for dizziness, extremity weakness, gait problem, headaches, light-headedness and seizures.  Hematological: Negative for adenopathy. Does not bruise/bleed easily.  Psychiatric/Behavioral: Negative for confusion, depression and sleep  disturbance. The patient is not nervous/anxious.     PHYSICAL EXAMINATION:  There were no vitals taken for this visit.  ECOG PERFORMANCE STATUS: 1 - Symptomatic but completely ambulatory  Physical Exam  Constitutional: Oriented to person, place, and time and well-developed, well-nourished, and in no distress.   HENT:  Head: Normocephalic and atraumatic.  Mouth/Throat: Oropharynx is clear and moist. No oropharyngeal exudate.  Eyes: Conjunctivae are normal. Right eye exhibits no discharge. Left eye exhibits no discharge. No scleral icterus.  Neck: Normal range of motion. Neck supple.  Cardiovascular: Normal rate, regular rhythm, normal heart sounds and intact distal pulses.  Pulmonary/Chest: Effort normal and breath sounds normal. No respiratory distress. No wheezes. No rales.  Abdominal: Soft. Bowel sounds are normal. Exhibits no distension and no mass. There is no tenderness.  Musculoskeletal: Normal range of  motion. Exhibits no edema.  Lymphadenopathy:  No cervical adenopathy.  Neurological: Alert and oriented to person, place, and time. Exhibits normal muscle tone. Gait normal. Coordination normal.  Skin: Skin is warm and dry. No rash noted. Not diaphoretic. No erythema. No pallor.  Psychiatric: Mood, memory and judgment normal.  Vitals reviewed.  LABORATORY DATA: Lab Results  Component Value Date   WBC 6.5 02/21/2020   HGB 10.7 (L) 02/21/2020   HCT 35.2 (L) 02/21/2020   MCV 80.2 02/21/2020   PLT 258 02/21/2020      Chemistry      Component Value Date/Time   NA 143 02/21/2020 0830   K 3.9 02/21/2020 0830   CL 106 02/21/2020 0830   CO2 29 02/21/2020 0830   BUN 14 02/21/2020 0830   CREATININE 1.66 (H) 02/21/2020 0830      Component Value Date/Time   CALCIUM 9.1 02/21/2020 0830   ALKPHOS 71 02/21/2020 0830   AST 21 02/21/2020 0830   ALT 13 02/21/2020 0830   BILITOT 0.4 02/21/2020 0830       RADIOGRAPHIC STUDIES:  MR BRAIN W WO CONTRAST  Result Date: 02/14/2020 CLINICAL DATA:  History of metastatic small cell lung cancer. Post whole-brain radiation. EXAM: MRI HEAD WITHOUT AND WITH CONTRAST TECHNIQUE: Multiplanar, multiecho pulse sequences of the brain and surrounding structures were obtained without and with intravenous contrast. CONTRAST:  68mL MULTIHANCE GADOBENATE DIMEGLUMINE 529 MG/ML IV SOLN COMPARISON:  MRI head 10/05/2019 FINDINGS: Brain: 2 mm enhancing lesion left inferolateral cerebellum unchanged from multiple prior studies including 12/22/2018. Possible small vessel versus treated tumor. No other enhancing lesions identified. No edema. Negative for acute infarct. Few small white matter hyperintensities unchanged. Negative for hemorrhage or hydrocephalus. Encephalomalacia in the inferior cerebellum bilaterally left greater than right is unchanged. Vascular: Normal arterial flow voids Skull and upper cervical spine: No suspicious skeletal lesions. ACDF cervical spine.  Sinuses/Orbits: Mucosal edema paranasal sinuses.  Negative orbit Other: None IMPRESSION: 2 mm enhancing left inferolateral cerebellum unchanged from multiple prior studies. This may represent a vessel or a treated metastasis which is stable. Otherwise no enhancing brain lesions identified. Electronically Signed   By: Franchot Gallo M.D.   On: 02/14/2020 10:55     ASSESSMENT/PLAN:  This is a very pleasant 65 year old African-American female with stage IV (T1c, N1,M1c)non-small celllung cancer, adenocarcinoma. She presented with a dominant left upper lobe lung nodule in addition to a right upper lobe pulmonary nodule with left hilar lymphadenopathy. She also has metastatic disease to the brain.She has no actionable mutations and her PDL1 expression is 1%. She was diagnosed in July 2020. She also has KRASG12C which is a targetable mutation  that can be used in the second line setting.   The patient completed SBRT to the metastatic brain lesion under the care of Dr. Isidore Moos in August 2020.   The patient is currently undergoing systemic chemotherapy withcarboplatin for an AUC of 5, Alimta 500 mg/m, and Keytruda 200 mg IV every 3 weeks. She is status post25cyclesof treatment. Starting from cycle #5, she has been on maintenance Alimta 500 mg/m2 and Keytruda 200 mg IV.She tolerated her treatment well without anyconcerningadverse side effects.Alimta wasdiscontinued due to renal insufficiency.  Labs were reviewed. Recommend that she proceed with cycle #26 today as scheduled.   We will see her back for a follow up visit in 3 weeks for evaluation before starting cycle #27.   The patient was advised to call immediately if she has any concerning symptoms in the interval. The patient voices understanding of current disease status and treatment options and is in agreement with the current care plan. All questions were answered. The patient knows to call the clinic with any problems, questions or  concerns. We can certainly see the patient much sooner if necessary    No orders of the defined types were placed in this encounter.    I spent 20-29 minutes in this encounter.   [place], PA-C 03/07/20

## 2020-03-12 ENCOUNTER — Encounter: Payer: Self-pay | Admitting: Physician Assistant

## 2020-03-12 ENCOUNTER — Inpatient Hospital Stay (HOSPITAL_BASED_OUTPATIENT_CLINIC_OR_DEPARTMENT_OTHER): Payer: Medicare Other | Admitting: Physician Assistant

## 2020-03-12 ENCOUNTER — Inpatient Hospital Stay: Payer: Medicare Other | Attending: Physician Assistant

## 2020-03-12 ENCOUNTER — Inpatient Hospital Stay: Payer: Medicare Other

## 2020-03-12 ENCOUNTER — Other Ambulatory Visit: Payer: Self-pay

## 2020-03-12 VITALS — BP 154/70 | HR 77 | Temp 97.8°F | Resp 18 | Ht 61.0 in | Wt 183.2 lb

## 2020-03-12 DIAGNOSIS — C3491 Malignant neoplasm of unspecified part of right bronchus or lung: Secondary | ICD-10-CM | POA: Diagnosis not present

## 2020-03-12 DIAGNOSIS — Z5112 Encounter for antineoplastic immunotherapy: Secondary | ICD-10-CM | POA: Diagnosis not present

## 2020-03-12 DIAGNOSIS — C3412 Malignant neoplasm of upper lobe, left bronchus or lung: Secondary | ICD-10-CM | POA: Insufficient documentation

## 2020-03-12 DIAGNOSIS — C7931 Secondary malignant neoplasm of brain: Secondary | ICD-10-CM | POA: Insufficient documentation

## 2020-03-12 DIAGNOSIS — Z95828 Presence of other vascular implants and grafts: Secondary | ICD-10-CM

## 2020-03-12 DIAGNOSIS — R5383 Other fatigue: Secondary | ICD-10-CM

## 2020-03-12 DIAGNOSIS — Z79899 Other long term (current) drug therapy: Secondary | ICD-10-CM | POA: Diagnosis not present

## 2020-03-12 DIAGNOSIS — C3492 Malignant neoplasm of unspecified part of left bronchus or lung: Secondary | ICD-10-CM

## 2020-03-12 LAB — CMP (CANCER CENTER ONLY)
ALT: 11 U/L (ref 0–44)
AST: 18 U/L (ref 15–41)
Albumin: 3.6 g/dL (ref 3.5–5.0)
Alkaline Phosphatase: 78 U/L (ref 38–126)
Anion gap: 8 (ref 5–15)
BUN: 16 mg/dL (ref 8–23)
CO2: 27 mmol/L (ref 22–32)
Calcium: 9.2 mg/dL (ref 8.9–10.3)
Chloride: 105 mmol/L (ref 98–111)
Creatinine: 1.81 mg/dL — ABNORMAL HIGH (ref 0.44–1.00)
GFR, Estimated: 30 mL/min — ABNORMAL LOW (ref >60)
Glucose, Bld: 135 mg/dL — ABNORMAL HIGH (ref 70–99)
Potassium: 3.9 mmol/L (ref 3.5–5.1)
Sodium: 140 mmol/L (ref 135–145)
Total Bilirubin: 0.4 mg/dL (ref 0.3–1.2)
Total Protein: 7.1 g/dL (ref 6.5–8.1)

## 2020-03-12 LAB — CBC WITH DIFFERENTIAL (CANCER CENTER ONLY)
Abs Immature Granulocytes: 0.02 10*3/uL (ref 0.00–0.07)
Basophils Absolute: 0.1 10*3/uL (ref 0.0–0.1)
Basophils Relative: 1 %
Eosinophils Absolute: 0.5 10*3/uL (ref 0.0–0.5)
Eosinophils Relative: 8 %
HCT: 35 % — ABNORMAL LOW (ref 36.0–46.0)
Hemoglobin: 10.7 g/dL — ABNORMAL LOW (ref 12.0–15.0)
Immature Granulocytes: 0 %
Lymphocytes Relative: 24 %
Lymphs Abs: 1.6 10*3/uL (ref 0.7–4.0)
MCH: 24.2 pg — ABNORMAL LOW (ref 26.0–34.0)
MCHC: 30.6 g/dL (ref 30.0–36.0)
MCV: 79.2 fL — ABNORMAL LOW (ref 80.0–100.0)
Monocytes Absolute: 0.6 10*3/uL (ref 0.1–1.0)
Monocytes Relative: 9 %
Neutro Abs: 3.8 10*3/uL (ref 1.7–7.7)
Neutrophils Relative %: 58 %
Platelet Count: 257 10*3/uL (ref 150–400)
RBC: 4.42 MIL/uL (ref 3.87–5.11)
RDW: 15.6 % — ABNORMAL HIGH (ref 11.5–15.5)
WBC Count: 6.6 10*3/uL (ref 4.0–10.5)
nRBC: 0 % (ref 0.0–0.2)

## 2020-03-12 LAB — TSH: TSH: 2.437 u[IU]/mL (ref 0.308–3.960)

## 2020-03-12 MED ORDER — SODIUM CHLORIDE 0.9% FLUSH
10.0000 mL | INTRAVENOUS | Status: DC | PRN
  Administered 2020-03-12: 10 mL
  Filled 2020-03-12: qty 10

## 2020-03-12 MED ORDER — HEPARIN SOD (PORK) LOCK FLUSH 100 UNIT/ML IV SOLN
500.0000 [IU] | Freq: Once | INTRAVENOUS | Status: AC | PRN
Start: 2020-03-12 — End: 2020-03-12
  Administered 2020-03-12: 500 [IU]
  Filled 2020-03-12: qty 5

## 2020-03-12 MED ORDER — SODIUM CHLORIDE 0.9 % IV SOLN
Freq: Once | INTRAVENOUS | Status: AC
  Filled 2020-03-12: qty 250

## 2020-03-12 MED ORDER — SODIUM CHLORIDE 0.9 % IV SOLN
200.0000 mg | Freq: Once | INTRAVENOUS | Status: AC
  Administered 2020-03-12: 200 mg via INTRAVENOUS
  Filled 2020-03-12: qty 8

## 2020-03-12 NOTE — Progress Notes (Signed)
Per Cassie, PA okay to treat with Scr. of 1.81

## 2020-03-12 NOTE — Patient Instructions (Signed)
[facility] Discharge Instructions for Patients Receiving Chemotherapy  Today you received the following chemotherapy agents Pembrolizumab [facility]).  To help prevent nausea and vomiting after your treatment, we encourage you to take your nausea medication as prescribed.   If you develop nausea and vomiting that is not controlled by your nausea medication, call the clinic.   BELOW ARE SYMPTOMS THAT SHOULD BE REPORTED IMMEDIATELY:  *FEVER GREATER THAN 100.5 F  *CHILLS WITH OR WITHOUT FEVER  NAUSEA AND VOMITING THAT IS NOT CONTROLLED WITH YOUR NAUSEA MEDICATION  *UNUSUAL SHORTNESS OF BREATH  *UNUSUAL BRUISING OR BLEEDING  TENDERNESS IN MOUTH AND THROAT WITH OR WITHOUT PRESENCE OF ULCERS  *URINARY PROBLEMS  *BOWEL PROBLEMS  UNUSUAL RASH Items with * indicate a potential emergency and should be followed up as soon as possible.  Feel free to call the clinic should you have any questions or concerns. The clinic phone number is [phone].  Please show the [facility] at check-in to the Emergency Department and triage nurse.

## 2020-03-12 NOTE — Patient Instructions (Signed)

## 2020-03-13 ENCOUNTER — Telehealth: Payer: Self-pay | Admitting: Physician Assistant

## 2020-03-13 NOTE — Telephone Encounter (Signed)
Scheduled appointments per 2/2 los. Will have updated calendar printed for patient at next visit.

## 2020-03-17 DIAGNOSIS — E1165 Type 2 diabetes mellitus with hyperglycemia: Secondary | ICD-10-CM | POA: Diagnosis not present

## 2020-03-17 DIAGNOSIS — M1990 Unspecified osteoarthritis, unspecified site: Secondary | ICD-10-CM | POA: Diagnosis not present

## 2020-03-17 DIAGNOSIS — E559 Vitamin D deficiency, unspecified: Secondary | ICD-10-CM | POA: Diagnosis not present

## 2020-03-17 DIAGNOSIS — I10 Essential (primary) hypertension: Secondary | ICD-10-CM | POA: Diagnosis not present

## 2020-03-17 DIAGNOSIS — Z0000 Encounter for general adult medical examination without abnormal findings: Secondary | ICD-10-CM | POA: Diagnosis not present

## 2020-03-17 DIAGNOSIS — E785 Hyperlipidemia, unspecified: Secondary | ICD-10-CM | POA: Diagnosis not present

## 2020-03-17 DIAGNOSIS — E1122 Type 2 diabetes mellitus with diabetic chronic kidney disease: Secondary | ICD-10-CM | POA: Diagnosis not present

## 2020-03-20 DIAGNOSIS — Z0000 Encounter for general adult medical examination without abnormal findings: Secondary | ICD-10-CM | POA: Diagnosis not present

## 2020-03-20 DIAGNOSIS — E785 Hyperlipidemia, unspecified: Secondary | ICD-10-CM | POA: Diagnosis not present

## 2020-03-20 DIAGNOSIS — I2510 Atherosclerotic heart disease of native coronary artery without angina pectoris: Secondary | ICD-10-CM | POA: Diagnosis not present

## 2020-03-20 DIAGNOSIS — I10 Essential (primary) hypertension: Secondary | ICD-10-CM | POA: Diagnosis not present

## 2020-03-20 DIAGNOSIS — E119 Type 2 diabetes mellitus without complications: Secondary | ICD-10-CM | POA: Diagnosis not present

## 2020-03-20 DIAGNOSIS — F5101 Primary insomnia: Secondary | ICD-10-CM | POA: Diagnosis not present

## 2020-03-20 DIAGNOSIS — C3490 Malignant neoplasm of unspecified part of unspecified bronchus or lung: Secondary | ICD-10-CM | POA: Diagnosis not present

## 2020-03-20 DIAGNOSIS — E559 Vitamin D deficiency, unspecified: Secondary | ICD-10-CM | POA: Diagnosis not present

## 2020-03-20 DIAGNOSIS — N183 Chronic kidney disease, stage 3 unspecified: Secondary | ICD-10-CM | POA: Diagnosis not present

## 2020-03-20 DIAGNOSIS — E611 Iron deficiency: Secondary | ICD-10-CM | POA: Diagnosis not present

## 2020-03-21 DIAGNOSIS — I129 Hypertensive chronic kidney disease with stage 1 through stage 4 chronic kidney disease, or unspecified chronic kidney disease: Secondary | ICD-10-CM | POA: Diagnosis not present

## 2020-03-21 DIAGNOSIS — E1122 Type 2 diabetes mellitus with diabetic chronic kidney disease: Secondary | ICD-10-CM | POA: Diagnosis not present

## 2020-03-21 DIAGNOSIS — C3492 Malignant neoplasm of unspecified part of left bronchus or lung: Secondary | ICD-10-CM | POA: Diagnosis not present

## 2020-03-21 DIAGNOSIS — N1832 Chronic kidney disease, stage 3b: Secondary | ICD-10-CM | POA: Diagnosis not present

## 2020-03-27 NOTE — Progress Notes (Signed)
Primary Physician/Referring:  [name], FNP  Patient ID: Carolyn [name], female    DOB: 1956/01/04, 65 y.o.   MRN: [id number]  Chief Complaint  Patient presents with  . Coronary Artery Disease  . Follow-up    1 year   HPI:    HPI: Carolyn [name]  is a 65 y.o. 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  Extremely positive attitude and has been doing well, last chemo infusion was on 03/12/2020.  She continues to follow with oncology.   She presents for annual visit, states that she is presently doing well.  She has gained weight.  Endorses less activity.  She has not had any further chest discomfort and has not used any sublingual nitroglycerin since last office visit.  She has not had any recurrence.  Denies dyspnea, PND or orthopnea.  Past Medical History:  Diagnosis Date  . Anginal pain ([place])    " with exertion "  . Arthritis    " 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 "  . Asthma    h/o  . Coronary artery disease   . Diabetes mellitus without complication ([place])    Type II  . GERD (gastroesophageal reflux disease)   . H/O hiatal hernia   . Heart murmur   . History of radiation therapy 09/22/2018   stereotactic radiosurgery   . 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 Hypertension   . nscl ca dx'd 08/07/18  . Persistent headaches    h/o  . Pneumonia    hx of PNA  . Shortness of breath   . Vaginal dryness    h/o   Past Surgical History:  Procedure Laterality Date  . ABDOMINAL HYSTERECTOMY    . bone spur    . CARDIAC CATHETERIZATION  06/13/2012  . carpel tunnel surgery Left   . COLONOSCOPY     9 years ago  . CORONARY ANGIOPLASTY  06/13/2012  . EYE SURGERY     cyst left eye   . IR IMAGING GUIDED PORT INSERTION  10/10/2018  . LEFT  HEART CATHETERIZATION WITH CORONARY ANGIOGRAM N/A 06/13/2012   Procedure: LEFT HEART CATHETERIZATION WITH CORONARY ANGIOGRAM;  Surgeon: Laverda Page, MD;  Location: [facility] C[facility] LAB;  Service: Cardiovascular;  Laterality: N/A;  . NECK SURGERY    . rotator cuff surgery Right 2015  . VIDEO BRONCHOSCOPY WITH ENDOBRONCHIAL NAVIGATION N/A 09/06/2018   Procedure: VIDEO BRONCHOSCOPY WITH ENDOBRONCHIAL NAVIGATION, left upper lung;  Surgeon: Lajuana Matte, MD;  Location: [place];  Service: Thoracic;  Laterality: N/A;  . VIDEO BRONCHOSCOPY WITH ENDOBRONCHIAL ULTRASOUND Left 09/06/2018   Procedure: VIDEO BRONCHOSCOPY WITH ENDOBRONCHIAL ULTRASOUND, left lung;  Surgeon: Lajuana Matte, MD;  Location: [place];  Service: Thoracic;  Laterality: Left;  . WISDOM TOOTH EXTRACTION     Social History   Tobacco Use  . Smoking status: Former Smoker    Packs/day: 0.50    Years: 30.00    Pack years: 15.00    Types: Cigarettes    Quit date: 02/09/1999    Years since quitting: 21.1  . Smokeless tobacco: Never Used  Substance Use Topics  . Alcohol use: Not Currently  Marital Status: Married    ROS  Review of Systems  Cardiovascular: Negative  for chest pain, dyspnea on exertion and leg swelling.  Musculoskeletal: Positive for arthritis.  Gastrointestinal: Negative for melena.   Objective  Blood pressure (!) 165/77, pulse (!) 58, temperature 98.2 F (36.8 C), temperature source Temporal, resp. rate 17, height 5\' 1"  (1.549 m), weight 183 lb 1.6 oz (83.1 kg), SpO2 98 %. Body mass index is 34.6 kg/m.   Vitals with BMI 03/28/2020 03/12/2020 02/21/2020  Height 5\' 1"  5\' 1"  5\' 1"   Weight 183 lbs 2 oz 183 lbs 3 oz 182 lbs 8 oz  BMI 34.61 02.54 27.0  Systolic 623 762 831  Diastolic 77 70 66  Pulse 58 77 65    Physical Exam Vitals reviewed.  Constitutional:      Comments: Moderately built and mildly obese  HENT:     Head: Normocephalic and atraumatic.  Cardiovascular:     Rate and Rhythm: Normal rate and  regular rhythm.     Pulses: Intact distal pulses.     Heart sounds: Normal heart sounds.     Comments: No JVD, No leg edema Pulmonary:     Effort: Pulmonary effort is normal. No accessory muscle usage or respiratory distress.     Breath sounds: Normal breath sounds.  Abdominal:     General: Bowel sounds are normal.     Palpations: Abdomen is soft.  Musculoskeletal:        General: Normal range of motion.     Cervical back: Normal range of motion.  Neurological:     General: No focal deficit present.     Mental Status: She is alert and oriented to person, place, and time.    Radiology: No results found.  Laboratory examination:   Recent Labs    10/18/19 1150 11/08/19 0900 11/12/19 0917 11/29/19 0910 01/30/20 0801 02/21/20 0830 03/12/20 0819  NA 141 141 140   < > 140 143 140  K 3.9 3.9 4.0   < > 3.9 3.9 3.9  CL 106 104 102   < > 107 106 105  CO2 27 29 28    < > 24 29 27   GLUCOSE 102* 131* 99   < > 151* 109* 135*  BUN 15 15 16    < > 20 14 16   CREATININE 1.76* 1.67* 1.64*   < > 1.71* 1.66* 1.81*  CALCIUM 9.6 9.2 9.2   < > 8.9 9.1 9.2  GFRNONAA 30* 32* 32*   < > 33* 34* 30*  GFRAA 34* 37* 37*  --   --   --   --    < > = values in this interval not displayed.   CMP Latest Ref Rng & Units 03/12/2020 02/21/2020 01/30/2020  Glucose 70 - 99 mg/dL 135(H) 109(H) 151(H)  BUN 8 - 23 mg/dL 16 14 20   Creatinine 0.44 - 1.00 mg/dL 1.81(H) 1.66(H) 1.71(H)  Sodium 135 - 145 mmol/L 140 143 140  Potassium 3.5 - 5.1 mmol/L 3.9 3.9 3.9  Chloride 98 - 111 mmol/L 105 106 107  CO2 22 - 32 mmol/L 27 29 24   Calcium 8.9 - 10.3 mg/dL 9.2 9.1 8.9  Total Protein 6.5 - 8.1 g/dL 7.1 7.0 6.7  Total Bilirubin 0.3 - 1.2 mg/dL 0.4 0.4 0.3  Alkaline Phos 38 - 126 U/L 78 71 62  AST 15 - 41 U/L 18 21 18   ALT 0 - 44 U/L 11 13 11    CBC Latest Ref Rng & Units 03/12/2020 02/21/2020 01/30/2020  WBC 4.0 - 10.5 K/uL 6.6 6.5 5.9  Hemoglobin  12.0 - 15.0 g/dL 10.7(L) 10.7(L) 10.3(L)  Hematocrit 36.0 - 46.0 %  35.0(L) 35.2(L) 33.6(L)  Platelets 150 - 400 K/uL 257 258 224    TSH Recent Labs    01/30/20 0801 02/21/20 0830 03/12/20 0819  TSH 2.204 2.248 2.437   External Labs 02/20/2018   Labs 05/28/2019:  A1c 6.2%.  Total cholesterol 173, triglycerides 63, HDL 60, LDL 100.  Non-HDL cholesterol 113.  A1C 6.600 09/04/2018 TSH 3.136 03/01/2019  Hemoglobin 8.800 03/01/2019. Platelets 295.000 03/01/2019 Creatinine, Serum 1.260 03/01/2019 Potassium 3.400 03/01/2019 Magnesium N/D ALT (SGPT) 24.000 03/01/2019  INR 1.000 10/10/2018   Medications   Current Outpatient Medications  Medication Instructions  . acetaminophen (TYLENOL) 1,000 mg, Oral, Every 8 hours PRN  . amLODipine (NORVASC) 10 mg, Oral, Daily  . aspirin EC 81 mg, Daily  . carvedilol (COREG) 25 MG tablet Take 1/2 (one-half) tablet by mouth twice daily  . Chromium Picolinate 1,000 mg, Oral, Daily  . esomeprazole (NEXIUM) 20 MG capsule 1 capsule, Oral, Daily  . ezetimibe (ZETIA) 10 mg, Oral, Daily after supper  . fexofenadine (ALLEGRA) 180 MG tablet 1 tablet swallow whole with water; do not take with fruit juices.  . Lancets (ONETOUCH DELICA PLUS [id number]) MISC SMARTSIG:1 Topical 3 Times Daily  . lidocaine-prilocaine (EMLA) cream Apply to the Port-A-Cath site 30 to 60 minutes before chemotherapy.  . Livalo 4 mg, Oral, Daily at bedtime  . metFORMIN (GLUCOPHAGE) 500 mg, Oral, 2 times daily with meals  . methocarbamol (ROBAXIN) 500 mg, Oral, Every 6 hours PRN  . montelukast (SINGULAIR) 10 MG tablet 1 tablet  . nitroGLYCERIN (NITROSTAT) 0.4 mg, Sublingual, Every 5 min PRN  . ONETOUCH ULTRA test strip USE 1 STRIP TO CHECK GLUCOSE THREE TIMES DAILY  . Polyethyl Glycol-Propyl Glycol 0.4-0.3 % SOLN 1 drop, Both Eyes, Every 6 hours PRN  . telmisartan (MICARDIS) 40 MG tablet 1 tablet, Oral, Daily  . Vitamin D-3 5,000 Units, Oral, Daily    Cardiac Studies:   cardiac catheterization on 06/13/2012: High-grade 99% mid LAD stenosis,  successful PTCA and stenting with 4.0 x 24 mm Veriflex non drug eluting stent.  EKG:   EKG 01/25/2021: Sinus bradycardia at rate of 55 bpm, borderline left atrial enlargement, normal axis.  Diffuse nonspecific T abnormality, cannot exclude inferior and lateral ischemia.  T wave inversion more prominent compared to EKG 03/09/2019.  Assessment     ICD-10-CM   1. Atherosclerosis of native coronary artery of native heart with stable angina pectoris ([place])  I25.118 PCV ECHOCARDIOGRAM COMPLETE    PCV MYOCARDIAL PERFUSION WO LEXISCAN  2. Nonspecific abnormal electrocardiogram (ECG) (EKG)  R94.31   3. Primary hypertension  I10 EKG 12-Lead    amLODipine (NORVASC) 10 MG tablet  4. Type 2 diabetes mellitus with stage 3b chronic kidney disease, without long-term current use of insulin (HCC)  E11.22    N18.32   5. Malignant neoplasm of right lung stage 4 (H[facility])  C34.91   6. Hypercholesteremia  E78.00 ezetimibe (ZETIA) 10 MG tablet    Lipid Panel With LDL/HDL Ratio    Lipid Panel With LDL/HDL Ratio    Meds ordered this encounter  Medications  . amLODipine (NORVASC) 10 MG tablet    Sig: Take 1 tablet (10 mg total) by mouth daily.    Dispense:  90 tablet    Refill:  3  . ezetimibe (ZETIA) 10 MG tablet    Sig: Take 1 tablet (10 mg total) by mouth daily after supper.    Dispense:  30 tablet    Refill:  2   Medications Discontinued During This Encounter  Medication Reason  . amLODipine (NORVASC) 5 MG tablet Dose change  . cetirizine (ZYRTEC) 10 MG tablet No longer needed (for PRN medications)  . prochlorperazine (COMPAZINE) 10 MG tablet No longer needed (for PRN medications)    Orders Placed This Encounter  Procedures  . Lipid Panel With LDL/HDL Ratio    Standing Status:   Future    Number of Occurrences:   1    Standing Expiration Date:   03/28/2021  . PCV MYOCARDIAL PERFUSION WO LEXISCAN    Standing Status:   Future    Standing Expiration Date:   05/26/2020  . EKG 12-Lead  . PCV  ECHOCARDIOGRAM COMPLETE    Standing Status:   Future    Standing Expiration Date:   03/28/2021   Recommendations:   Mrs. Frady Taddeo is a 65 y.o.  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  Extremely positive attitude and has been doing well, last chemo infusion was on 03/12/2020.  She continues to follow with oncology.   Patient is presently doing well.  She has not had any recurrence of angina pectoris and has not used any sublingual nitroglycerin in the recent past.  She has not had any symptoms to suggest heart failure either.  I reviewed her labs, LDL is not at goal and also non-HDL cholesterol is also slightly high although her HDL is at goal.  I will increase the dose of amlodipine to 10 mg and also to follow-up on lipids.  I will add Zetia 10 mg and will obtain lipid profile testing in approximately 2 months.  I have congratulated her on her positive attitude towards managing her lung cancer.  Blood pressure is not well controlled, I will increase the dose of amlodipine to 10 mg from 5 mg.  Renal function has remained stable with stage IIIb chronic kidney disease along with stable CBC (mild anemia).  She has not had any cardiac work-up since 2014 PCI.  Also the EKG reveals abnormality with T wave suggestive of ischemia.  We will obtain a nuclear stress test and an echocardiogram to exclude progression of CAD in view of uncontrolled hyperlipidemia.  Diabetes mellitus is well controlled.  Unless tests are abnormal, I will see her back in 3 months to follow-up on hypertension,  This was a 40-minute office visit encounter with evaluation of external records, management of complex medical issues and coordination of care.   Adrian Prows, MD, [facility] 03/28/2020, 10:32 AM Office: [phone] Pager: [phone]

## 2020-03-28 ENCOUNTER — Ambulatory Visit: Payer: Medicare Other | Admitting: Cardiology

## 2020-03-28 ENCOUNTER — Other Ambulatory Visit: Payer: Self-pay

## 2020-03-28 ENCOUNTER — Telehealth: Payer: Self-pay

## 2020-03-28 ENCOUNTER — Encounter: Payer: Self-pay | Admitting: Cardiology

## 2020-03-28 VITALS — BP 165/77 | HR 58 | Temp 98.2°F | Resp 17 | Ht 61.0 in | Wt 183.1 lb

## 2020-03-28 DIAGNOSIS — R9431 Abnormal electrocardiogram [ECG] [EKG]: Secondary | ICD-10-CM

## 2020-03-28 DIAGNOSIS — E7800 Pure hypercholesterolemia, unspecified: Secondary | ICD-10-CM | POA: Diagnosis not present

## 2020-03-28 DIAGNOSIS — I25118 Atherosclerotic heart disease of native coronary artery with other forms of angina pectoris: Secondary | ICD-10-CM

## 2020-03-28 DIAGNOSIS — N1832 Chronic kidney disease, stage 3b: Secondary | ICD-10-CM | POA: Diagnosis not present

## 2020-03-28 DIAGNOSIS — E1122 Type 2 diabetes mellitus with diabetic chronic kidney disease: Secondary | ICD-10-CM | POA: Diagnosis not present

## 2020-03-28 DIAGNOSIS — I10 Essential (primary) hypertension: Secondary | ICD-10-CM

## 2020-03-28 DIAGNOSIS — C3491 Malignant neoplasm of unspecified part of right bronchus or lung: Secondary | ICD-10-CM | POA: Diagnosis not present

## 2020-03-28 MED ORDER — EZETIMIBE 10 MG PO TABS: 10.0000 mg | ORAL_TABLET | Freq: Every day | ORAL | 2 refills | Status: DC

## 2020-03-28 MED ORDER — AMLODIPINE BESYLATE 10 MG PO TABS: 10.0000 mg | ORAL_TABLET | Freq: Every day | ORAL | 3 refills | Status: DC

## 2020-03-28 NOTE — Telephone Encounter (Signed)
Pt called and stated that you told her to start taking sleep aid. She went to the store and said that there are so many, she doesn't know which one to take.

## 2020-03-28 NOTE — Patient Instructions (Signed)
Do not take carvedilol one day before and day of stress test

## 2020-03-28 NOTE — Progress Notes (Signed)
Carolyn [name]  Carolyn [name], [address] [address]  DIAGNOSIS: Stage IV(T1c, N1, M1 C)non-small cell lung cancer, adenocarcinoma. Shepresented with left upper lobe lung nodule in addition to right upper lobe lung nodule with left hilar lymphadenopathy as well as multiple brain metastasis.She was diagnosed in July of 2020  Biomarker Findings Microsatellite status - MS-Stable Tumor Mutational Burden - 4 Muts/Mb Genomic Findings For a complete list of the genes assayed, please refer to the Appendix. ATM G204* KRAS G12C PDGFRA P122T SMO R199W 7 Disease relevant genes with no reportable alterations: ALK, BRAF, EGFR, ERBB2, MET, RET, ROS1  PDL 1 expressionwas 1%  PRIOR THERAPY: SBRT to multiple brain metastasis under the care of Dr. Isidore Moos.Completed in August 2020  CURRENT THERAPY: Systemic chemotherapy with Carboplatin for an AUC of 5, Alimta 500 mg/m2, and Keytruda 200 mg IV every 3 weeks. Status post27cyclesof treatment. First dose on 10/05/2018.She started maintenance Keytruda and Alimta starting from cycle #5. Alimta was discontinued due to renal insufficiency   INTERVAL HISTORY: Carolyn [name] 65 y.o. female returns to the clinic today forafollow-up visit. The patient is feeling well today without any concerning complaints. She states she sometimes has a cramping feeling in her left upper extremity. No traumas or injuries that she is aware of. No swelling or overlying skin changes. She states certain movements exacerbate her pain. She states she has issues in this arm occasionally due to history of having neck surgery. She is followed by an orthopedic physician in [facility].   She also has been having some ongoing issues with sinus pressure. She was referred to an allergist by her PCP and is waiting for an appointment. She takes Human resources officer. Her previous MRIs of the brain shows sinus edema. She is  planning to go by their office today to try to get an appointment.   Her chemotherapy with alimta was discontinued due to renal insuffiencey for which she is followed by nephrology. She tolerated her last cycle of treatment with single agent immunotherapy with Keytruda well without any concerning adverse side effects. The patient denies any recent fever, chills, or weight loss. She reports baseline night sweats.She denies any chest pain, shortness of breath, cough, or hemoptysis. She denies any nausea, vomiting, diarrhea, or constipation. She denies any rashes or skin changes. She denies any visual changes. She has baseline occasional headaches.She had a repeat MRI of the brain on 02/14/20. She follows with neuro-oncology. Her scan was stable. She is here today for evaluation before starting cycle #27.   MEDICAL HISTORY: Past Medical History:  Diagnosis Date  . Anginal pain ([place])    " with exertion "  . Arthritis    " 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 "  . Asthma    h/o  . Coronary artery disease   . Diabetes mellitus without complication ([place])    Type II  . GERD (gastroesophageal reflux disease)   . H/O hiatal hernia   . Heart murmur   . History of radiation therapy 09/22/2018   stereotactic radiosurgery   . 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 Hypertension   . nscl ca dx'd 08/07/18  . Persistent headaches    h/o  . Pneumonia    hx of PNA  . Shortness of breath   . Vaginal dryness    h/o    ALLERGIES:  is allergic to lisinopril.  MEDICATIONS:  Current Outpatient Medications  Medication Sig Dispense Refill  .  acetaminophen (TYLENOL) 500 MG tablet Take 1,000 mg by mouth every 8 (eight) hours as needed for mild pain, fever or headache.     Marland Kitchen amLODipine (NORVASC) 10 MG tablet Take 1 tablet (10 mg total) by mouth daily. 90 tablet 3  . aspirin EC 81 MG tablet Take 81 mg by mouth daily.    . carvedilol (COREG) 25 MG tablet Take 1/2 (one-half) tablet by mouth twice daily 180 tablet 0  . Cholecalciferol (VITAMIN  D-3) 125 MCG (5000 UT) TABS Take 5,000 Units by mouth daily.    . Chromium Picolinate 1000 MCG TABS Take 1,000 mg by mouth daily.    Marland Kitchen esomeprazole (NEXIUM) 20 MG capsule Take 1 capsule by mouth daily.    Marland Kitchen ezetimibe (ZETIA) 10 MG tablet Take 1 tablet (10 mg total) by mouth daily after supper. 30 tablet 2  . fexofenadine (ALLEGRA) 180 MG tablet 1 tablet swallow whole with water; do not take with fruit juices.    . Lancets (ONETOUCH DELICA PLUS [id number]) MISC SMARTSIG:1 Topical 3 Times Daily    . lidocaine-prilocaine (EMLA) cream Apply to the Port-A-Cath site 30 to 60 minutes before chemotherapy. 30 g 2  . metFORMIN (GLUCOPHAGE) 500 MG tablet Take 500 mg by mouth 2 (two) times daily with a meal.     . methocarbamol (ROBAXIN) 500 MG tablet Take 1 tablet (500 mg total) by mouth every 6 (six) hours as needed for muscle spasms. 21 tablet 0  . montelukast (SINGULAIR) 10 MG tablet 1 tablet    . nitroGLYCERIN (NITROSTAT) 0.4 MG SL tablet Place 1 tablet (0.4 mg total) under the tongue every 5 (five) minutes as needed for chest pain. 25 tablet 2  . ONETOUCH ULTRA test strip USE 1 STRIP TO CHECK GLUCOSE THREE TIMES DAILY    . Pitavastatin Calcium (LIVALO) 4 MG TABS Take 4 mg by mouth at bedtime.     Carolyn [name] Glycol-Propyl Glycol 0.4-0.3 % SOLN Place 1 drop into both eyes every 6 (six) hours as needed (dry eyes).     Marland Kitchen telmisartan (MICARDIS) 40 MG tablet Take 1 tablet by mouth daily.     No current facility-administered medications for this visit.   Facility-Administered Medications Ordered in Other Visits  Medication Dose Route Frequency Provider Last Rate Last Admin  . 0.9 %  sodium chloride infusion   Intravenous Once Curt Bears, MD      . heparin lock flush 100 unit/mL  500 Units Intracatheter Once PRN Curt Bears, MD      . pembrolizumab [facility]) 200 mg in sodium chloride 0.9 % 50 mL chemo infusion  200 mg Intravenous Once Curt Bears, MD      . sodium chloride flush (NS) 0.9 %  injection 10 mL  10 mL Intracatheter PRN Curt Bears, MD        SURGICAL HISTORY:  Past Surgical History:  Procedure Laterality Date  . ABDOMINAL HYSTERECTOMY    . bone spur    . CARDIAC CATHETERIZATION  06/13/2012  . carpel tunnel surgery Left   . COLONOSCOPY     9 years ago  . CORONARY ANGIOPLASTY  06/13/2012  . EYE SURGERY     cyst left eye   . IR IMAGING GUIDED PORT INSERTION  10/10/2018  . LEFT HEART CATHETERIZATION WITH CORONARY ANGIOGRAM N/A 06/13/2012   Procedure: LEFT HEART CATHETERIZATION WITH CORONARY ANGIOGRAM;  Surgeon: Laverda Page, MD;  Location[facility] C[facility] LAB;  Service: Cardiovascular;  Laterality: N/A;  . NECK SURGERY    .  rotator cuff surgery Right 2015  . VIDEO BRONCHOSCOPY WITH ENDOBRONCHIAL NAVIGATION N/A 09/06/2018   Procedure: VIDEO BRONCHOSCOPY WITH ENDOBRONCHIAL NAVIGATION, left upper lung;  Surgeon: Lajuana Matte, MD;  Location: [facility];  Service: Thoracic;  Laterality: N/A;  . VIDEO BRONCHOSCOPY WITH ENDOBRONCHIAL ULTRASOUND Left 09/06/2018   Procedure: VIDEO BRONCHOSCOPY WITH ENDOBRONCHIAL ULTRASOUND, left lung;  Surgeon: Lajuana Matte, MD;  Location: [place] [facility];  Service: Thoracic;  Laterality: Left;  . WISDOM TOOTH EXTRACTION      REVIEW OF SYSTEMS:   Review of Systems  Constitutional: Negative for appetite change, chills, fatigue, fever and unexpected weight change.  HENT: Positive for sinus pressure. Negative for mouth sores, nosebleeds, sore throat and trouble swallowing.   Eyes: Negative for eye problems and icterus.  Respiratory: Negative for cough, hemoptysis, shortness of breath and wheezing.   Cardiovascular: Negative for chest pain and leg swelling.  Gastrointestinal: Negative for abdominal pain, constipation, diarrhea, nausea and vomiting.  Genitourinary: Negative for bladder incontinence, difficulty urinating, dysuria, frequency and hematuria.   Musculoskeletal: Positive for left upper extremity pain above elbow. Negative for back  pain, gait problem, neck pain and neck stiffness.  Skin: Negative for itching and rash.  Neurological: Positive for occasional headaches. Negative for dizziness, extremity weakness, gait problem, headaches, light-headedness and seizures.  Hematological: Negative for adenopathy. Does not bruise/bleed easily.  Psychiatric/Behavioral: Negative for confusion, depression and sleep disturbance. The patient is not nervous/anxious.     PHYSICAL EXAMINATION:  Blood pressure (!) 144/68, pulse 65, temperature (!) 97.3 F (36.3 C), temperature source Tympanic, resp. rate 16, height $RemoveBe'5\' 1"'[id number]$  (1.549 m), weight 181 lb 9.6 oz (82.4 kg), SpO2 99 %.  ECOG PERFORMANCE STATUS: 1 - Symptomatic but completely ambulatory  Physical Exam  Constitutional: Oriented to person, place, and time and well-developed, well-nourished, and in no distress.  HENT:  Head: Normocephalic and atraumatic.  Mouth/Throat: Oropharynx is clear and moist. No oropharyngeal exudate.  Eyes: Conjunctivae are normal. Right eye exhibits no discharge. Left eye exhibits no discharge. No scleral icterus.  Neck: Normal range of motion. Neck supple.  Cardiovascular: Normal rate, regular rhythm, normal heart sounds and intact distal pulses.  Pulmonary/Chest: Effort normal and breath sounds normal. No respiratory distress. No wheezes. No rales.  Abdominal: Soft. Bowel sounds are normal. Exhibits no distension and no mass. There is no tenderness.  Musculoskeletal: Tenderness to palpation over musculature in left upper arm. No edema, erythema, or skin breakdown. Normal range of motion. Exhibits no edema.  Lymphadenopathy:  No cervical adenopathy.  Neurological: Alert and oriented to person, place, and time. Exhibits normal muscle tone. Gait normal. Coordination normal.  Skin: Skin is warm and dry. No rash noted. Not diaphoretic. No erythema. No pallor.  Psychiatric: Mood, memory and judgment normal.  Vitals reviewed.  LABORATORY DATA: Lab  Results  Component Value Date   WBC 6.4 04/02/2020   HGB 10.9 (L) 04/02/2020   HCT 35.6 (L) 04/02/2020   MCV 78.9 (L) 04/02/2020   PLT 268 04/02/2020      Chemistry      Component Value Date/Time   NA 141 04/02/2020 0838   K 3.8 04/02/2020 0838   CL 106 04/02/2020 0838   CO2 26 04/02/2020 0838   BUN 16 04/02/2020 0838   CREATININE 1.78 (H) 04/02/2020 0838      Component Value Date/Time   CALCIUM 9.1 04/02/2020 0838   ALKPHOS 75 04/02/2020 0838   AST 19 04/02/2020 0838   ALT 10 04/02/2020 0838   BILITOT  0.4 04/02/2020 0838       RADIOGRAPHIC STUDIES:  No results found.   ASSESSMENT/PLAN:  This is a very pleasant 65 year old African-American female with stage IV (T1c, N1,M1c)non-small celllung cancer, adenocarcinoma. She presented with a dominant left upper lobe lung nodule in addition to a right upper lobe pulmonary nodule with left hilar lymphadenopathy. She also has metastatic disease to the brain.She has no actionable mutations and her PDL1 expression is 1%. She was diagnosed in July 2020.She also has KRASG12C which is a targetable mutation that can be used in the second line setting.  The patient completed SBRT to the metastatic brain lesion under the care of Dr. Isidore Moos in August 2020.   The patient is currently undergoing systemic chemotherapy withcarboplatin for an AUC of 5, Alimta 500 mg/m, and Keytruda 200 mg IV every 3 weeks. She is status post26cyclesof treatment. Starting from cycle #5, she has been on maintenance Alimta 500 mg/m2 and Keytruda 200 mg IV.She tolerated her treatment well without anyconcerningadverse side effects.Alimta wasdiscontinued due to renal insufficiency.  Labs were reviewed. Recommend that she proceed with cycle #27 today as scheduled.  I will arrange for a restaging CT scan of the chest, abdomen, and pelvis prior to starting her next cycle of treatment. I will order it without contrast due to her renal insufficiency.    We will see her back for a follow up visit in 3 weeks for evaluation and to review her scan results before starting cycle #28.   She will follow up with her allergist about her ongoing sinus pressure not responsive to prior therapies.   Regarding her pain in her upper left arm, she was tender to palpation over the musculature. Likely musculoskeletal pain. No swelling or erythema. Encouraged rest of this extremity, ice, and tylenol. If no improvement or worsening symptoms, advised to see her orthopedic physician.     The patient was advised to call immediately if she has any concerning symptoms in the interval. The patient voices understanding of current disease status and treatment options and is in agreement with the current care plan. All questions were answered. The patient knows to call the clinic with any problems, questions or concerns. We can certainly see the patient much sooner if necessary          Orders Placed This Encounter  Procedures  . CT Chest W Contrast    Standing Status:   Future    Standing Expiration Date:   04/02/2021    Order Specific Question:   If indicated for the ordered procedure, I authorize the administration of contrast media per Radiology protocol    Answer:   Yes    Order Specific Question:   Preferred imaging location?    Answer:   [facility]  . CT Abdomen Pelvis W Contrast    Standing Status:   Future    Standing Expiration Date:   04/02/2021    Order Specific Question:   If indicated for the ordered procedure, I authorize the administration of contrast media per Radiology protocol    Answer:   Yes    Order Specific Question:   Preferred imaging location?    Answer:   [facility]    Order Specific Question:   Is Oral Contrast requested for this exam?    Answer:   Yes, Per Radiology protocol     I spent 20-29 minutes in this encounter.   Lavern Crimi L Karsyn Jamie, PA-C 04/02/20

## 2020-04-02 ENCOUNTER — Inpatient Hospital Stay: Payer: Medicare Other

## 2020-04-02 ENCOUNTER — Inpatient Hospital Stay (HOSPITAL_BASED_OUTPATIENT_CLINIC_OR_DEPARTMENT_OTHER): Payer: Medicare Other | Admitting: Physician Assistant

## 2020-04-02 ENCOUNTER — Encounter: Payer: Self-pay | Admitting: Physician Assistant

## 2020-04-02 ENCOUNTER — Other Ambulatory Visit: Payer: Self-pay

## 2020-04-02 VITALS — BP 144/68 | HR 65 | Temp 97.3°F | Resp 16 | Ht 61.0 in | Wt 181.6 lb

## 2020-04-02 DIAGNOSIS — Z79899 Other long term (current) drug therapy: Secondary | ICD-10-CM | POA: Diagnosis not present

## 2020-04-02 DIAGNOSIS — R5383 Other fatigue: Secondary | ICD-10-CM

## 2020-04-02 DIAGNOSIS — C3492 Malignant neoplasm of unspecified part of left bronchus or lung: Secondary | ICD-10-CM

## 2020-04-02 DIAGNOSIS — Z95828 Presence of other vascular implants and grafts: Secondary | ICD-10-CM

## 2020-04-02 DIAGNOSIS — C3491 Malignant neoplasm of unspecified part of right bronchus or lung: Secondary | ICD-10-CM

## 2020-04-02 DIAGNOSIS — C3412 Malignant neoplasm of upper lobe, left bronchus or lung: Secondary | ICD-10-CM | POA: Diagnosis not present

## 2020-04-02 DIAGNOSIS — Z5112 Encounter for antineoplastic immunotherapy: Secondary | ICD-10-CM

## 2020-04-02 DIAGNOSIS — C7931 Secondary malignant neoplasm of brain: Secondary | ICD-10-CM | POA: Diagnosis not present

## 2020-04-02 LAB — CMP (CANCER CENTER ONLY)
ALT: 10 U/L (ref 0–44)
AST: 19 U/L (ref 15–41)
Albumin: 3.6 g/dL (ref 3.5–5.0)
Alkaline Phosphatase: 75 U/L (ref 38–126)
Anion gap: 9 (ref 5–15)
BUN: 16 mg/dL (ref 8–23)
CO2: 26 mmol/L (ref 22–32)
Calcium: 9.1 mg/dL (ref 8.9–10.3)
Chloride: 106 mmol/L (ref 98–111)
Creatinine: 1.78 mg/dL — ABNORMAL HIGH (ref 0.44–1.00)
GFR, Estimated: 31 mL/min — ABNORMAL LOW (ref >60)
Glucose, Bld: 120 mg/dL — ABNORMAL HIGH (ref 70–99)
Potassium: 3.8 mmol/L (ref 3.5–5.1)
Sodium: 141 mmol/L (ref 135–145)
Total Bilirubin: 0.4 mg/dL (ref 0.3–1.2)
Total Protein: 7.2 g/dL (ref 6.5–8.1)

## 2020-04-02 LAB — CBC WITH DIFFERENTIAL (CANCER CENTER ONLY)
Abs Immature Granulocytes: 0.01 10*3/uL (ref 0.00–0.07)
Basophils Absolute: 0.1 10*3/uL (ref 0.0–0.1)
Basophils Relative: 1 %
Eosinophils Absolute: 0.6 10*3/uL — ABNORMAL HIGH (ref 0.0–0.5)
Eosinophils Relative: 9 %
HCT: 35.6 % — ABNORMAL LOW (ref 36.0–46.0)
Hemoglobin: 10.9 g/dL — ABNORMAL LOW (ref 12.0–15.0)
Immature Granulocytes: 0 %
Lymphocytes Relative: 25 %
Lymphs Abs: 1.6 10*3/uL (ref 0.7–4.0)
MCH: 24.2 pg — ABNORMAL LOW (ref 26.0–34.0)
MCHC: 30.6 g/dL (ref 30.0–36.0)
MCV: 78.9 fL — ABNORMAL LOW (ref 80.0–100.0)
Monocytes Absolute: 0.5 10*3/uL (ref 0.1–1.0)
Monocytes Relative: 9 %
Neutro Abs: 3.6 10*3/uL (ref 1.7–7.7)
Neutrophils Relative %: 56 %
Platelet Count: 268 10*3/uL (ref 150–400)
RBC: 4.51 MIL/uL (ref 3.87–5.11)
RDW: 15.6 % — ABNORMAL HIGH (ref 11.5–15.5)
WBC Count: 6.4 10*3/uL (ref 4.0–10.5)
nRBC: 0 % (ref 0.0–0.2)

## 2020-04-02 LAB — TSH: TSH: 1.984 u[IU]/mL (ref 0.308–3.960)

## 2020-04-02 MED ORDER — SODIUM CHLORIDE 0.9% FLUSH
10.0000 mL | INTRAVENOUS | Status: DC | PRN
  Administered 2020-04-02: 10 mL
  Filled 2020-04-02: qty 10

## 2020-04-02 MED ORDER — SODIUM CHLORIDE 0.9 % IV SOLN
Freq: Once | INTRAVENOUS | Status: AC
  Filled 2020-04-02: qty 250

## 2020-04-02 MED ORDER — SODIUM CHLORIDE 0.9 % IV SOLN
200.0000 mg | Freq: Once | INTRAVENOUS | Status: AC
  Administered 2020-04-02: 200 mg via INTRAVENOUS
  Filled 2020-04-02: qty 8

## 2020-04-02 MED ORDER — HEPARIN SOD (PORK) LOCK FLUSH 100 UNIT/ML IV SOLN
500.0000 [IU] | Freq: Once | INTRAVENOUS | Status: AC | PRN
  Administered 2020-04-02: 500 [IU]
  Filled 2020-04-02: qty 5

## 2020-04-02 NOTE — Patient Instructions (Signed)
[place] Discharge Instructions for Patients Receiving Chemotherapy  Today you received the following chemotherapy agents: Pembrolizumab Beryle Flock)  To help prevent nausea and vomiting after your treatment, we encourage you to take your nausea medication  as prescribed.    If you develop nausea and vomiting that is not controlled by your nausea medication, call the clinic.   BELOW ARE SYMPTOMS THAT SHOULD BE REPORTED IMMEDIATELY:  *FEVER GREATER THAN 100.5 F  *CHILLS WITH OR WITHOUT FEVER  NAUSEA AND VOMITING THAT IS NOT CONTROLLED WITH YOUR NAUSEA MEDICATION  *UNUSUAL SHORTNESS OF BREATH  *UNUSUAL BRUISING OR BLEEDING  TENDERNESS IN MOUTH AND THROAT WITH OR WITHOUT PRESENCE OF ULCERS  *URINARY PROBLEMS  *BOWEL PROBLEMS  UNUSUAL RASH Items with * indicate a potential emergency and should be followed up as soon as possible.  Feel free to call the clinic should you have any questions or concerns. The clinic phone number is [phone].  Please show the [facility] at check-in to the Emergency Department and triage nurse.

## 2020-04-02 NOTE — Progress Notes (Signed)
Per Cassie, ok to treat with Scr 1.78.

## 2020-04-02 NOTE — Patient Instructions (Signed)

## 2020-04-07 ENCOUNTER — Ambulatory Visit: Payer: Medicare Other

## 2020-04-07 ENCOUNTER — Other Ambulatory Visit: Payer: Self-pay

## 2020-04-07 DIAGNOSIS — I25118 Atherosclerotic heart disease of native coronary artery with other forms of angina pectoris: Secondary | ICD-10-CM

## 2020-04-09 ENCOUNTER — Ambulatory Visit: Payer: Medicare Other

## 2020-04-09 ENCOUNTER — Other Ambulatory Visit: Payer: Self-pay

## 2020-04-09 DIAGNOSIS — I25118 Atherosclerotic heart disease of native coronary artery with other forms of angina pectoris: Secondary | ICD-10-CM | POA: Diagnosis not present

## 2020-04-11 DIAGNOSIS — R768 Other specified abnormal immunological findings in serum: Secondary | ICD-10-CM | POA: Diagnosis not present

## 2020-04-11 DIAGNOSIS — N1831 Chronic kidney disease, stage 3a: Secondary | ICD-10-CM | POA: Diagnosis not present

## 2020-04-11 DIAGNOSIS — M25552 Pain in left hip: Secondary | ICD-10-CM | POA: Diagnosis not present

## 2020-04-11 DIAGNOSIS — C3490 Malignant neoplasm of unspecified part of unspecified bronchus or lung: Secondary | ICD-10-CM | POA: Diagnosis not present

## 2020-04-11 DIAGNOSIS — M1990 Unspecified osteoarthritis, unspecified site: Secondary | ICD-10-CM | POA: Diagnosis not present

## 2020-04-11 DIAGNOSIS — M7710 Lateral epicondylitis, unspecified elbow: Secondary | ICD-10-CM | POA: Diagnosis not present

## 2020-04-11 DIAGNOSIS — M549 Dorsalgia, unspecified: Secondary | ICD-10-CM | POA: Diagnosis not present

## 2020-04-11 DIAGNOSIS — M25522 Pain in left elbow: Secondary | ICD-10-CM | POA: Diagnosis not present

## 2020-04-11 DIAGNOSIS — M2550 Pain in unspecified joint: Secondary | ICD-10-CM | POA: Diagnosis not present

## 2020-04-14 DIAGNOSIS — Z01419 Encounter for gynecological examination (general) (routine) without abnormal findings: Secondary | ICD-10-CM | POA: Diagnosis not present

## 2020-04-14 DIAGNOSIS — Z6834 Body mass index (BMI) 34.0-34.9, adult: Secondary | ICD-10-CM | POA: Diagnosis not present

## 2020-04-18 NOTE — Progress Notes (Signed)
[facility] OFFICE PROGRESS NOTE  [name], [place] [address]  DIAGNOSIS: Stage IV(T1c, N1, M1 C)non-small cell lung cancer, adenocarcinoma. Shepresented with left upper lobe lung nodule in addition to right upper lobe lung nodule with left hilar lymphadenopathy as well as multiple brain metastasis.She was diagnosed in July of 2020  Biomarker Findings Microsatellite status - MS-Stable Tumor Mutational Burden - 4 Muts/Mb Genomic Findings For a complete list of the genes assayed, please refer to the Appendix. ATM G204* KRAS G12C PDGFRA P122T SMO R199W 7 Disease relevant genes with no reportable alterations: ALK, BRAF, EGFR, ERBB2, MET, RET, ROS1  PDL 1 expressionwas 1%  PRIOR THERAPY: SBRT to multiple brain metastasis under the care of Dr. Isidore Moos.Completed in August 2020  CURRENT THERAPY: Systemic chemotherapy with Carboplatin for an AUC of 5, Alimta 500 mg/m2, and Keytruda 200 mg IV every 3 weeks. Status post27cyclesof treatment. First dose on 10/05/2018.She started maintenance Keytruda and Alimta starting from cycle #5. Alimta was discontinued due to renal insufficiency  INTERVAL HISTORY: MANREET KIERNAN 65 y.o. female returns to the clinic today for a follow up visit. The patient is feeling well today without any concerning complaints except for continued sneezing/sinus trouble. She sees an allergist on Friday. She did not like singular as it dried her nose out too much. She also started walking more and signed up for exercise classes. Her chemotherapy with alimta was discontinued due to renal insuffiencey for which she is followed by nephrology. She tolerated her last cycle of treatment with single agent immunotherapy with Keytruda well without any concerning adverse side effects. The patient denies any recent fever, chills, or weight loss. She reports baseline night sweats.She denies any chest pain, shortness  of breath, cough, or hemoptysis. She denies any nausea, vomiting, diarrhea, or constipation. She denies any rashes or skin changes. She denies any visual changes.She has baseline occasional headaches.She had a repeat MRI of the brain on 02/14/20. She follows with neuro-oncology. She recently had a restaging CT scan. She is here today for evaluation and to review her scan results before starting cycle #28.   MEDICAL HISTORY: Past Medical History:  Diagnosis Date  . Anginal pain ([place])    " with exertion "  . Arthritis    " 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 "  . Asthma    h/o  . Coronary artery disease   . Diabetes mellitus without complication ([facility])    Type II  . GERD (gastroesophageal reflux disease)   . H/O hiatal hernia   . Heart murmur   . History of radiation therapy 09/22/2018   stereotactic radiosurgery   . 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 Hypertension   . nscl ca dx'd 08/07/18  . Persistent headaches    h/o  . Pneumonia    hx of PNA  . Shortness of breath   . Vaginal dryness    h/o    ALLERGIES:  is allergic to lisinopril.  MEDICATIONS:  Current Outpatient Medications  Medication Sig Dispense Refill  . acetaminophen (TYLENOL) 500 MG tablet Take 1,000 mg by mouth every 8 (eight) hours as needed for mild pain, fever or headache.     Marland Kitchen amLODipine (NORVASC) 10 MG tablet Take 1 tablet (10 mg total) by mouth daily. 90 tablet 3  . aspirin EC 81 MG tablet Take 81 mg by mouth daily.    . carvedilol (COREG) 25 MG tablet Take 1/2 (one-half) tablet by mouth  twice daily 180 tablet 0  . Cholecalciferol (VITAMIN D-3) 125 MCG (5000 UT) TABS Take 5,000 Units by mouth daily.    . Chromium Picolinate 1000 MCG TABS Take 1,000 mg by mouth daily.    Marland Kitchen esomeprazole (NEXIUM) 20 MG capsule Take 1 capsule by mouth daily.    Marland Kitchen ezetimibe (ZETIA) 10 MG tablet Take 1 tablet (10 mg total) by mouth daily after supper. 30 tablet 2  . fexofenadine (ALLEGRA) 180 MG tablet 1 tablet swallow whole with water; do not take with  fruit juices.    . folic acid (FOLVITE) 1 MG tablet folic acid 1 mg tablet    . Lancets (ONETOUCH DELICA PLUS [id number]) MISC SMARTSIG:1 Topical 3 Times Daily    . lidocaine-prilocaine (EMLA) cream Apply to the Port-A-Cath site 30 to 60 minutes before chemotherapy. 30 g 2  . metFORMIN (GLUCOPHAGE) 500 MG tablet Take 500 mg by mouth 2 (two) times daily with a meal.     . methocarbamol (ROBAXIN) 500 MG tablet Take 1 tablet (500 mg total) by mouth every 6 (six) hours as needed for muscle spasms. 21 tablet 0  . montelukast (SINGULAIR) 10 MG tablet 1 tablet    . nitroGLYCERIN (NITROSTAT) 0.4 MG SL tablet Place 1 tablet (0.4 mg total) under the tongue every 5 (five) minutes as needed for chest pain. 25 tablet 2  . omega-3 acid ethyl esters (LOVAZA) 1 g capsule Lovaza 1 gram capsule  Take 2 capsules twice a day by oral route.    Glory Rosebush ULTRA test strip USE 1 STRIP TO CHECK GLUCOSE THREE TIMES DAILY    . Pitavastatin Calcium (LIVALO) 4 MG TABS Take 4 mg by mouth at bedtime.     Vladimir Faster Glycol-Propyl Glycol 0.4-0.3 % SOLN Place 1 drop into both eyes every 6 (six) hours as needed (dry eyes).     Marland Kitchen telmisartan (MICARDIS) 40 MG tablet Take 1 tablet by mouth daily.     No current facility-administered medications for this visit.    SURGICAL HISTORY:  Past Surgical History:  Procedure Laterality Date  . ABDOMINAL HYSTERECTOMY    . bone spur    . CARDIAC CATHETERIZATION  06/13/2012  . carpel tunnel surgery Left   . COLONOSCOPY     9 years ago  . CORONARY ANGIOPLASTY  06/13/2012  . EYE SURGERY     cyst left eye   . IR IMAGING GUIDED PORT INSERTION  10/10/2018  . LEFT HEART CATHETERIZATION WITH CORONARY ANGIOGRAM N/A 06/13/2012   Procedure: LEFT HEART CATHETERIZATION WITH CORONARY ANGIOGRAM;  Surgeon: Laverda Page, MD;  Location: [facility] CATH LAB;  Service: Cardiovascular;  Laterality: N/A;  . NECK SURGERY    . rotator cuff surgery Right 2015  . VIDEO BRONCHOSCOPY WITH ENDOBRONCHIAL NAVIGATION  N/A 09/06/2018   Procedure: VIDEO BRONCHOSCOPY WITH ENDOBRONCHIAL NAVIGATION, left upper lung;  Surgeon: Lajuana Matte, MD;  Location: [facility];  Service: Thoracic;  Laterality: N/A;  . VIDEO BRONCHOSCOPY WITH ENDOBRONCHIAL ULTRASOUND Left 09/06/2018   Procedure: VIDEO BRONCHOSCOPY WITH ENDOBRONCHIAL ULTRASOUND, left lung;  Surgeon: Lajuana Matte, MD;  Location: [facility];  Service: Thoracic;  Laterality: Left;  . WISDOM TOOTH EXTRACTION      REVIEW OF SYSTEMS:   Review of Systems  Constitutional: Negative for appetite change, chills, fatigue, fever and unexpected weight change.  HENT: Positive for sinus pressure.Negative for mouth sores, nosebleeds, sore throat and trouble swallowing.  Eyes: Negative for eye problems and icterus.  Respiratory: Negative for cough, hemoptysis,  shortness of breath and wheezing.  Cardiovascular: Negative for chest pain and leg swelling.  Gastrointestinal: Negative for abdominal pain, constipation, diarrhea, nausea and vomiting.  Genitourinary: Negative for bladder incontinence, difficulty urinating, dysuria, frequency and hematuria.  Musculoskeletal: Negative for back pain, gait problem, neck pain and neck stiffness.  Skin: Negative for itching and rash.  Neurological:Positive for occasional headaches.Negative for dizziness, extremity weakness, gait problem, headaches, light-headedness and seizures.  Hematological: Negative for adenopathy. Does not bruise/bleed easily.  Psychiatric/Behavioral: Negative for confusion, depression and sleep disturbance. The patient is not nervous/anxious.   PHYSICAL EXAMINATION:  Blood pressure 125/67, pulse 65, temperature 97.7 F (36.5 C), temperature source Tympanic, resp. rate 14, height $RemoveBe'5\' 1"'yRamzZMSj$  (1.549 m), weight 183 lb 12.8 oz (83.4 kg), SpO2 100 %.  ECOG PERFORMANCE STATUS: 1 - Symptomatic but completely ambulatory  Physical Exam  Constitutional: Oriented to person, place, and time and well-developed,  well-nourished, and in no distress.  HENT:  Head: Normocephalic and atraumatic.  Mouth/Throat: Oropharynx is clear and moist. No oropharyngeal exudate.  Eyes: Conjunctivae are normal. Right eye exhibits no discharge. Left eye exhibits no discharge. No scleral icterus.  Neck: Normal range of motion. Neck supple.  Cardiovascular: Normal rate, regular rhythm, normal heart sounds and intact distal pulses.  Pulmonary/Chest: Effort normal and breath sounds normal. No respiratory distress. No wheezes. No rales.  Abdominal: Soft. Bowel sounds are normal. Exhibits no distension and no mass. There is no tenderness.  Musculoskeletal: Tenderness to palpation over musculature in left upper arm. No edema, erythema, or skin breakdown. Normal range of motion. Exhibits no edema.  Lymphadenopathy:  No cervical adenopathy.  Neurological: Alert and oriented to person, place, and time. Exhibits normal muscle tone. Gait normal. Coordination normal.  Skin: Skin is warm and dry. No rash noted. Not diaphoretic. No erythema. No pallor.  Psychiatric: Mood, memory and judgment normal.  Vitals reviewed.  LABORATORY DATA: Lab Results  Component Value Date   WBC 7.4 04/23/2020   HGB 11.0 (L) 04/23/2020   HCT 35.5 (L) 04/23/2020   MCV 78.7 (L) 04/23/2020   PLT 267 04/23/2020      Chemistry      Component Value Date/Time   NA 139 04/23/2020 0827   K 4.0 04/23/2020 0827   CL 105 04/23/2020 0827   CO2 26 04/23/2020 0827   BUN 16 04/23/2020 0827   CREATININE 1.71 (H) 04/23/2020 0827      Component Value Date/Time   CALCIUM 9.2 04/23/2020 0827   ALKPHOS 73 04/23/2020 0827   AST 16 04/23/2020 0827   ALT 11 04/23/2020 0827   BILITOT 0.2 (L) 04/23/2020 0827       RADIOGRAPHIC STUDIES:  CT Abdomen Pelvis Wo Contrast  Result Date: 04/21/2020 CLINICAL DATA:  Non-small-cell lung cancer.  Restaging. EXAM: CT CHEST, ABDOMEN AND PELVIS WITHOUT CONTRAST TECHNIQUE: Multidetector CT imaging of the chest,  abdomen and pelvis was performed following the standard protocol without IV contrast. COMPARISON:  01/31/2020 FINDINGS: CT CHEST FINDINGS Cardiovascular: The heart size is normal. Tiny anterior pericardial effusion is similar. Coronary artery calcification is evident. Atherosclerotic calcification is noted in the wall of the thoracic aorta. Right Port-A-Cath tip is positioned in the proximal to mid SVC. Mediastinum/Nodes: No mediastinal lymphadenopathy. No evidence for gross hilar lymphadenopathy although assessment is limited by the lack of intravenous contrast on today's study. The esophagus has normal imaging features. There is no axillary lymphadenopathy. Lungs/Pleura: Stable appearance left upper lobe parenchymal scarring including 8 mm nodular component on 43/4. Peripheral right  upper lobe nodular opacity (36/4) is also not appreciably changed in the interval. The 10 mm posterior right lower lobe ground-glass opacity identified as new on the previous exam has resolved in the interval. No new suspicious pulmonary nodule or mass. No focal airspace consolidation. No pleural effusion. Musculoskeletal: No worrisome lytic or sclerotic osseous abnormality. CT ABDOMEN PELVIS FINDINGS Hepatobiliary: No focal abnormality in the liver on this study without intravenous contrast. There is no evidence for gallstones, gallbladder wall thickening, or pericholecystic fluid. No intrahepatic or extrahepatic biliary dilation. Pancreas: No focal mass lesion. No dilatation of the main duct. No intraparenchymal cyst. No peripancreatic edema. Spleen: No splenomegaly. No focal mass lesion. Adrenals/Urinary Tract: No adrenal nodule or mass. Unremarkable noncontrast appearance of the kidneys. No evidence for hydroureter. The urinary bladder appears normal for the degree of distention. Stomach/Bowel: Stomach is distended with contrast material. Duodenum is normally positioned as is the ligament of Treitz. Duodenal diverticulum noted. No  small bowel wall thickening. No small bowel dilatation. The terminal ileum is normal. The appendix is normal. No gross colonic mass. No colonic wall thickening. Vascular/Lymphatic: There is abdominal aortic atherosclerosis without aneurysm. There is no gastrohepatic or hepatoduodenal ligament lymphadenopathy. No retroperitoneal or mesenteric lymphadenopathy. No pelvic sidewall lymphadenopathy. Reproductive: The uterus is surgically absent. There is no adnexal mass. Other: No intraperitoneal free fluid. Musculoskeletal: No worrisome lytic or sclerotic osseous abnormality. IMPRESSION: 1. Stable exam. No new or progressive findings to suggest recurrent or metastatic disease in the chest, abdomen, or pelvis. 2. The 10 mm posterior right lower lobe ground-glass opacity identified as new on the previous exam has resolved in the interval. 3. Aortic Atherosclerosis (ICD10-I70.0). Electronically Signed   By: Misty Stanley M.D.   On: 04/21/2020 08:14   CT Chest Wo Contrast  Result Date: 04/21/2020 CLINICAL DATA:  Non-small-cell lung cancer.  Restaging. EXAM: CT CHEST, ABDOMEN AND PELVIS WITHOUT CONTRAST TECHNIQUE: Multidetector CT imaging of the chest, abdomen and pelvis was performed following the standard protocol without IV contrast. COMPARISON:  01/31/2020 FINDINGS: CT CHEST FINDINGS Cardiovascular: The heart size is normal. Tiny anterior pericardial effusion is similar. Coronary artery calcification is evident. Atherosclerotic calcification is noted in the wall of the thoracic aorta. Right Port-A-Cath tip is positioned in the proximal to mid SVC. Mediastinum/Nodes: No mediastinal lymphadenopathy. No evidence for gross hilar lymphadenopathy although assessment is limited by the lack of intravenous contrast on today's study. The esophagus has normal imaging features. There is no axillary lymphadenopathy. Lungs/Pleura: Stable appearance left upper lobe parenchymal scarring including 8 mm nodular component on 43/4.  Peripheral right upper lobe nodular opacity (36/4) is also not appreciably changed in the interval. The 10 mm posterior right lower lobe ground-glass opacity identified as new on the previous exam has resolved in the interval. No new suspicious pulmonary nodule or mass. No focal airspace consolidation. No pleural effusion. Musculoskeletal: No worrisome lytic or sclerotic osseous abnormality. CT ABDOMEN PELVIS FINDINGS Hepatobiliary: No focal abnormality in the liver on this study without intravenous contrast. There is no evidence for gallstones, gallbladder wall thickening, or pericholecystic fluid. No intrahepatic or extrahepatic biliary dilation. Pancreas: No focal mass lesion. No dilatation of the main duct. No intraparenchymal cyst. No peripancreatic edema. Spleen: No splenomegaly. No focal mass lesion. Adrenals/Urinary Tract: No adrenal nodule or mass. Unremarkable noncontrast appearance of the kidneys. No evidence for hydroureter. The urinary bladder appears normal for the degree of distention. Stomach/Bowel: Stomach is distended with contrast material. Duodenum is normally positioned as is the ligament of  Treitz. Duodenal diverticulum noted. No small bowel wall thickening. No small bowel dilatation. The terminal ileum is normal. The appendix is normal. No gross colonic mass. No colonic wall thickening. Vascular/Lymphatic: There is abdominal aortic atherosclerosis without aneurysm. There is no gastrohepatic or hepatoduodenal ligament lymphadenopathy. No retroperitoneal or mesenteric lymphadenopathy. No pelvic sidewall lymphadenopathy. Reproductive: The uterus is surgically absent. There is no adnexal mass. Other: No intraperitoneal free fluid. Musculoskeletal: No worrisome lytic or sclerotic osseous abnormality. IMPRESSION: 1. Stable exam. No new or progressive findings to suggest recurrent or metastatic disease in the chest, abdomen, or pelvis. 2. The 10 mm posterior right lower lobe ground-glass opacity  identified as new on the previous exam has resolved in the interval. 3. Aortic Atherosclerosis (ICD10-I70.0). Electronically Signed   By: Misty Stanley M.D.   On: 04/21/2020 08:14   PCV ECHOCARDIOGRAM COMPLETE  Result Date: 04/12/2020 Echocardiogram 04/09/2020: Left ventricle cavity is normal in size. Moderate concentric hypertrophy of the left ventricle. Normal global wall motion. Normal LV systolic function with EF 61%. Doppler evidence of grade I (impaired) diastolic dysfunction, normal LAP. Left atrial cavity is mildly dilated. Mild (Grade I) mitral regurgitation. Mild tricuspid regurgitation. No evidence of pulmonary hypertension. Study in 2014 showed mild LVH, trace MR and TR>  PCV MYOCARDIAL PERFUSION WO LEXISCAN  Result Date: 04/07/2020 Exercise tetrofosmin stress test  04/07/2020: Normal ECG stress. The patient exercised for 7 minutes and 58 seconds of a Bruce protocol, achieving approximately 10.11 METs.  No chest pain. NOrmap BP response. Myocardial perfusion is normal. Overall LV systolic function is normal without regional wall motion abnormalities. Stress LV EF: 64%. No previous exam available for comparison. Low risk .    ASSESSMENT/PLAN:  This is a very pleasant 65 year old African-American female with stage IV (T1c, N1,M1c)non-small celllung cancer, adenocarcinoma. She presented with a dominant left upper lobe lung nodule in addition to a right upper lobe pulmonary nodule with left hilar lymphadenopathy. She also has metastatic disease to the brain.She has no actionable mutations and her PDL1 expression is 1%. She was diagnosed in July 2020.She also has KRASG12C which is a targetable mutation that can be used in the second line setting.  The patient completed SBRT to the metastatic brain lesion under the care of Dr. Isidore Moos in August 2020.   The patient is currently undergoing systemic chemotherapy withcarboplatin for an AUC of 5, Alimta 500 mg/m, and Keytruda 200 mg IV every 3  weeks. She is status post27cyclesof treatment. Starting from cycle #5, she has been on maintenance Alimta 500 mg/m2 and Keytruda 200 mg IV.She tolerated her treatment well without anyconcerningadverse side effects.Alimta wasdiscontinued due to renal insufficiency.  The patient recently had a restaging CT scan performed. Dr. Julien Nordmann personally and independently reviewed the scan and discussed the results with the patient. The scan showed no evidence for disease progression. Dr. Julien Nordmann recommends that she proceed with cycle #28 today as scheduled.   We will see her back for a follow up visit in 3 weeks for evaluation before starting cycle #29.   She will follow up with her allergist on Friday for her sneezing and sinus concerns.   The patient was advised to call immediately if she has any concerning symptoms in the interval. The patient voices understanding of current disease status and treatment options and is in agreement with the current care plan. All questions were answered. The patient knows to call the clinic with any problems, questions or concerns. We can certainly see the patient much sooner if  necessary   No orders of the defined types were placed in this encounter.    I spent 20-29 minutes in this encounter.   Cassandra L Heilingoetter, PA-C 04/23/20  ADDENDUM: Hematology/Oncology Attending: I had a face-to-face encounter with the patient today.  I reviewed her record, scan and recommended her care plan.  This is a very pleasant 65 years old African-American female diagnosed with stage IV non-small cell lung cancer, adenocarcinoma with positive K-ras G12C mutation and PD-L1 expression of 1% diagnosed in July 2020.  The patient status post SBRT to brain metastasis followed by induction systemic chemotherapy with 4 cycles of carboplatin, Alimta and Keytruda followed by maintenance treatment with Alimta and Keytruda and recently with single agent Keytruda because of the  renal insufficiency.  She is status post a total of 27 cycles.  The patient has been tolerating this treatment well with no concerning adverse effects. She had repeat CT scan of the chest, abdomen pelvis performed recently.  I personally and independently reviewed the scan and discussed the results with the patient today. Her scan showed no concerning findings for disease progression. I recommended for the patient to continue her current treatment with single agent Keytruda and she will proceed with cycle #28 today. The patient will come back for follow-up visit in 3 weeks for evaluation before the next cycle of her treatment. She was advised to call immediately if she has any other concerning symptoms in the interval.  The total time spent in the appointment was 35 minutes. Disclaimer: This note was dictated with voice recognition software. Similar sounding words can inadvertently be transcribed and may be missed upon review. Eilleen Kempf, MD 04/23/20

## 2020-04-21 ENCOUNTER — Ambulatory Visit (HOSPITAL_COMMUNITY)
Admission: RE | Admit: 2020-04-21 | Discharge: 2020-04-21 | Disposition: A | Discharge status: Home / Self Care | Payer: Medicare Other | Source: Ambulatory Visit | Attending: Physician Assistant | Admitting: Physician Assistant

## 2020-04-21 ENCOUNTER — Other Ambulatory Visit: Payer: Self-pay

## 2020-04-21 ENCOUNTER — Other Ambulatory Visit: Payer: Self-pay | Admitting: Cardiology

## 2020-04-21 DIAGNOSIS — I313 Pericardial effusion (noninflammatory): Secondary | ICD-10-CM | POA: Diagnosis not present

## 2020-04-21 DIAGNOSIS — I2510 Atherosclerotic heart disease of native coronary artery without angina pectoris: Secondary | ICD-10-CM | POA: Diagnosis not present

## 2020-04-21 DIAGNOSIS — C3490 Malignant neoplasm of unspecified part of unspecified bronchus or lung: Secondary | ICD-10-CM | POA: Diagnosis not present

## 2020-04-21 DIAGNOSIS — K3189 Other diseases of stomach and duodenum: Secondary | ICD-10-CM | POA: Diagnosis not present

## 2020-04-21 DIAGNOSIS — J984 Other disorders of lung: Secondary | ICD-10-CM | POA: Diagnosis not present

## 2020-04-21 DIAGNOSIS — I700 Atherosclerosis of aorta: Secondary | ICD-10-CM | POA: Diagnosis not present

## 2020-04-21 DIAGNOSIS — C3491 Malignant neoplasm of unspecified part of right bronchus or lung: Secondary | ICD-10-CM | POA: Insufficient documentation

## 2020-04-21 DIAGNOSIS — K6389 Other specified diseases of intestine: Secondary | ICD-10-CM | POA: Diagnosis not present

## 2020-04-23 ENCOUNTER — Inpatient Hospital Stay (HOSPITAL_BASED_OUTPATIENT_CLINIC_OR_DEPARTMENT_OTHER): Payer: Medicare Other | Admitting: Physician Assistant

## 2020-04-23 ENCOUNTER — Inpatient Hospital Stay: Payer: Medicare Other | Attending: Physician Assistant

## 2020-04-23 ENCOUNTER — Inpatient Hospital Stay: Payer: Medicare Other

## 2020-04-23 ENCOUNTER — Other Ambulatory Visit: Payer: Self-pay

## 2020-04-23 VITALS — BP 125/67 | HR 65 | Temp 97.7°F | Resp 14 | Ht 61.0 in | Wt 183.8 lb

## 2020-04-23 DIAGNOSIS — R5383 Other fatigue: Secondary | ICD-10-CM

## 2020-04-23 DIAGNOSIS — C7931 Secondary malignant neoplasm of brain: Secondary | ICD-10-CM | POA: Diagnosis not present

## 2020-04-23 DIAGNOSIS — Z95828 Presence of other vascular implants and grafts: Secondary | ICD-10-CM

## 2020-04-23 DIAGNOSIS — C3492 Malignant neoplasm of unspecified part of left bronchus or lung: Secondary | ICD-10-CM

## 2020-04-23 DIAGNOSIS — Z5112 Encounter for antineoplastic immunotherapy: Secondary | ICD-10-CM

## 2020-04-23 DIAGNOSIS — C3412 Malignant neoplasm of upper lobe, left bronchus or lung: Secondary | ICD-10-CM | POA: Diagnosis not present

## 2020-04-23 DIAGNOSIS — I25118 Atherosclerotic heart disease of native coronary artery with other forms of angina pectoris: Secondary | ICD-10-CM | POA: Diagnosis not present

## 2020-04-23 DIAGNOSIS — Z79899 Other long term (current) drug therapy: Secondary | ICD-10-CM | POA: Diagnosis not present

## 2020-04-23 DIAGNOSIS — C3491 Malignant neoplasm of unspecified part of right bronchus or lung: Secondary | ICD-10-CM | POA: Diagnosis not present

## 2020-04-23 LAB — CBC WITH DIFFERENTIAL (CANCER CENTER ONLY)
Abs Immature Granulocytes: 0.02 10*3/uL (ref 0.00–0.07)
Basophils Absolute: 0 10*3/uL (ref 0.0–0.1)
Basophils Relative: 1 %
Eosinophils Absolute: 0.8 10*3/uL — ABNORMAL HIGH (ref 0.0–0.5)
Eosinophils Relative: 11 %
HCT: 35.5 % — ABNORMAL LOW (ref 36.0–46.0)
Hemoglobin: 11 g/dL — ABNORMAL LOW (ref 12.0–15.0)
Immature Granulocytes: 0 %
Lymphocytes Relative: 22 %
Lymphs Abs: 1.7 10*3/uL (ref 0.7–4.0)
MCH: 24.4 pg — ABNORMAL LOW (ref 26.0–34.0)
MCHC: 31 g/dL (ref 30.0–36.0)
MCV: 78.7 fL — ABNORMAL LOW (ref 80.0–100.0)
Monocytes Absolute: 0.6 10*3/uL (ref 0.1–1.0)
Monocytes Relative: 9 %
Neutro Abs: 4.3 10*3/uL (ref 1.7–7.7)
Neutrophils Relative %: 57 %
Platelet Count: 267 10*3/uL (ref 150–400)
RBC: 4.51 MIL/uL (ref 3.87–5.11)
RDW: 15.6 % — ABNORMAL HIGH (ref 11.5–15.5)
WBC Count: 7.4 10*3/uL (ref 4.0–10.5)
nRBC: 0 % (ref 0.0–0.2)

## 2020-04-23 LAB — CMP (CANCER CENTER ONLY)
ALT: 11 U/L (ref 0–44)
AST: 16 U/L (ref 15–41)
Albumin: 3.6 g/dL (ref 3.5–5.0)
Alkaline Phosphatase: 73 U/L (ref 38–126)
Anion gap: 8 (ref 5–15)
BUN: 16 mg/dL (ref 8–23)
CO2: 26 mmol/L (ref 22–32)
Calcium: 9.2 mg/dL (ref 8.9–10.3)
Chloride: 105 mmol/L (ref 98–111)
Creatinine: 1.71 mg/dL — ABNORMAL HIGH (ref 0.44–1.00)
GFR, Estimated: 33 mL/min — ABNORMAL LOW (ref >60)
Glucose, Bld: 112 mg/dL — ABNORMAL HIGH (ref 70–99)
Potassium: 4 mmol/L (ref 3.5–5.1)
Sodium: 139 mmol/L (ref 135–145)
Total Bilirubin: 0.2 mg/dL — ABNORMAL LOW (ref 0.3–1.2)
Total Protein: 7.2 g/dL (ref 6.5–8.1)

## 2020-04-23 LAB — TSH: TSH: 2.381 u[IU]/mL (ref 0.308–3.960)

## 2020-04-23 MED ORDER — SODIUM CHLORIDE 0.9 % IV SOLN
200.0000 mg | Freq: Once | INTRAVENOUS | Status: AC
  Administered 2020-04-23: 200 mg via INTRAVENOUS
  Filled 2020-04-23: qty 8

## 2020-04-23 MED ORDER — SODIUM CHLORIDE 0.9 % IV SOLN
Freq: Once | INTRAVENOUS | Status: AC
  Filled 2020-04-23: qty 250

## 2020-04-23 MED ORDER — HEPARIN SOD (PORK) LOCK FLUSH 100 UNIT/ML IV SOLN
500.0000 [IU] | Freq: Once | INTRAVENOUS | Status: AC | PRN
  Administered 2020-04-23: 500 [IU]
  Filled 2020-04-23: qty 5

## 2020-04-23 MED ORDER — SODIUM CHLORIDE 0.9% FLUSH
10.0000 mL | INTRAVENOUS | Status: DC | PRN
  Filled 2020-04-23: qty 10

## 2020-04-23 MED ORDER — SODIUM CHLORIDE 0.9% FLUSH
10.0000 mL | INTRAVENOUS | Status: DC | PRN
  Administered 2020-04-23: 10 mL
  Filled 2020-04-23: qty 10

## 2020-04-23 NOTE — Patient Instructions (Signed)
[facility] Discharge Instructions for Patients Receiving Chemotherapy  Today you received the following chemotherapy agents Pembrolizumab(Keytruda)  To help prevent nausea and vomiting after your treatment, we encourage you to take your nausea medication as directed.   If you develop nausea and vomiting that is not controlled by your nausea medication, call the clinic.   BELOW ARE SYMPTOMS THAT SHOULD BE REPORTED IMMEDIATELY:  *FEVER GREATER THAN 100.5 F  *CHILLS WITH OR WITHOUT FEVER  NAUSEA AND VOMITING THAT IS NOT CONTROLLED WITH YOUR NAUSEA MEDICATION  *UNUSUAL SHORTNESS OF BREATH  *UNUSUAL BRUISING OR BLEEDING  TENDERNESS IN MOUTH AND THROAT WITH OR WITHOUT PRESENCE OF ULCERS  *URINARY PROBLEMS  *BOWEL PROBLEMS  UNUSUAL RASH Items with * indicate a potential emergency and should be followed up as soon as possible.  Feel free to call the clinic should you have any questions or concerns. The clinic phone number is [phone].  Please show the [facility] at check-in to the Emergency Department and triage nurse.

## 2020-04-23 NOTE — Patient Instructions (Signed)

## 2020-04-25 DIAGNOSIS — J3089 Other allergic rhinitis: Secondary | ICD-10-CM | POA: Diagnosis not present

## 2020-04-25 DIAGNOSIS — J309 Allergic rhinitis, unspecified: Secondary | ICD-10-CM | POA: Diagnosis not present

## 2020-05-09 IMAGING — MR MRI HEAD WITHOUT AND WITH CONTRAST
9 of 12 series · 24 of 48 positions shown · IV contrast (gadavist)
Comparison: 08/28/2018
COMPARISON: 08/28/2018

Addendum:
CLINICAL DATA: Non-small cell lung cancer with brain metastases.

EXAM:
MRI HEAD WITHOUT AND WITH CONTRAST
TECHNIQUE: Multiplanar, multiecho pulse sequences of the brain and surrounding
structures were obtained without and with intravenous contrast.
CONTRAST:  8 mL Gadavist

[Series 2: FLAIR · sagittal · 3.0mm · 0.47mm/px · 1 of 36 slices shown (1 of 2)]
[im 1/36]
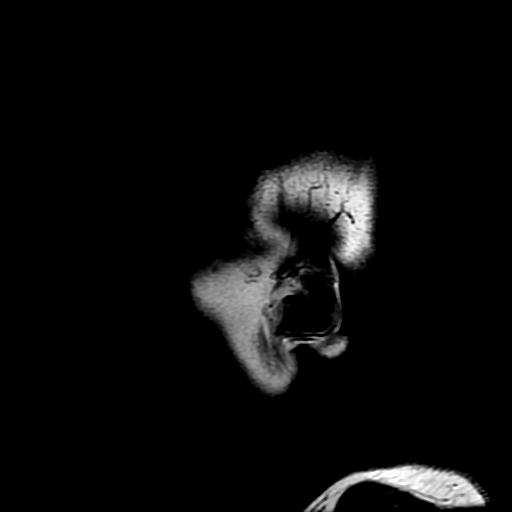

[Series 3: DWI · axial · 3.0mm · 0.94mm/px · z∈[-75,+96]mm · 6 of 116 slices shown]
[im 1/116]
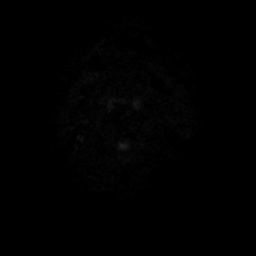
[im 24/116]
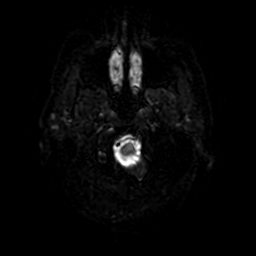
[im 47/116]
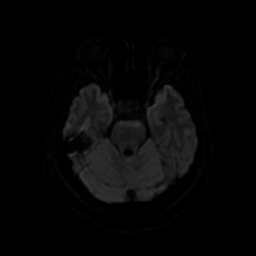
[im 70/116]
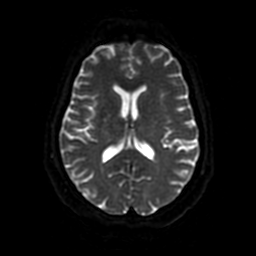
[im 93/116]
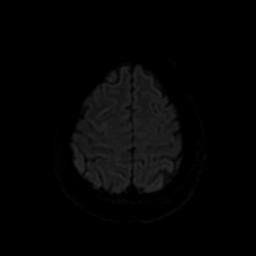
[im 116/116]
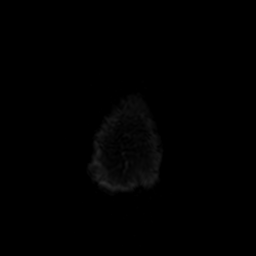

[Series 4: T2 · axial · 5.0mm · 0.47mm/px · 1 of 29 slices shown]
[im 1/29]
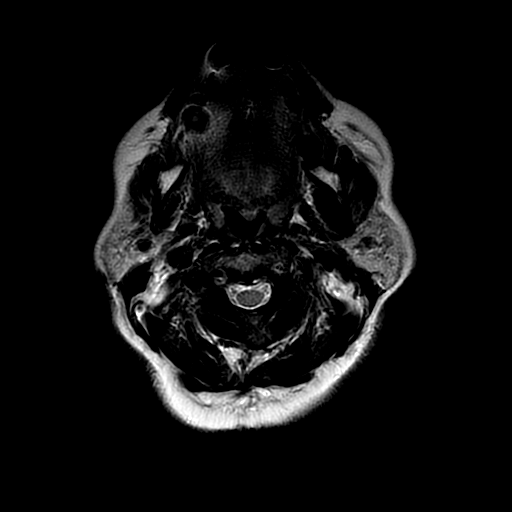

[Series 5: FLAIR · axial · 3.0mm · 0.47mm/px · z∈[-53,+109]mm · 3 of 55 slices shown (2 of 2)]
[im 1/55]
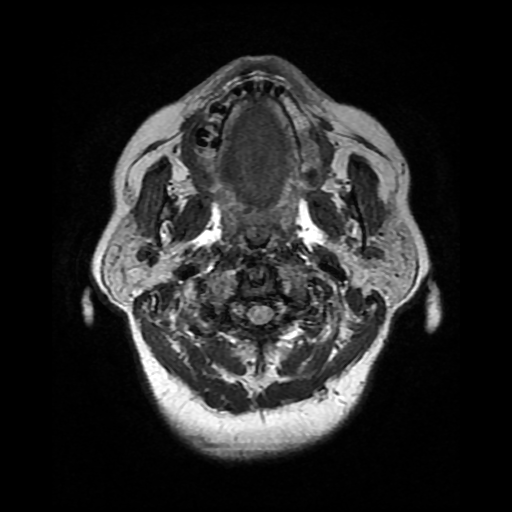
[im 28/55]
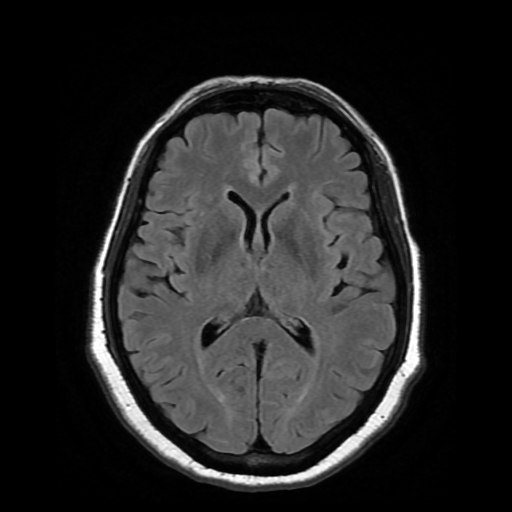
[im 55/55]
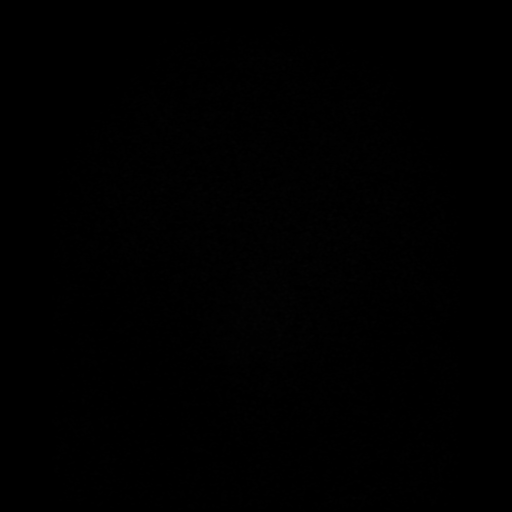

[Series 6: SWI · axial · 3.0mm · 0.47mm/px · z∈[-75,+28]mm · 4 of 116 slices shown]
[im 1/116]
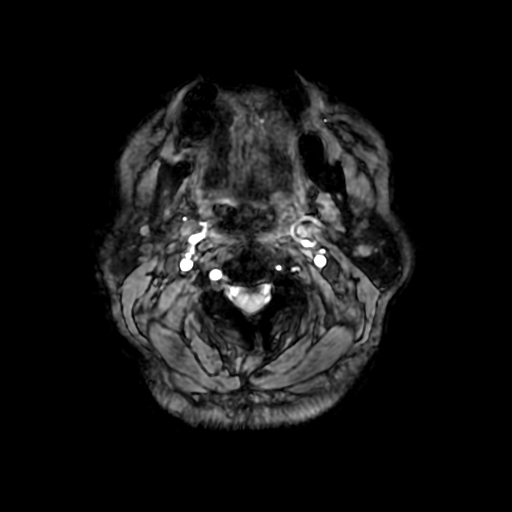
[im 24/116]
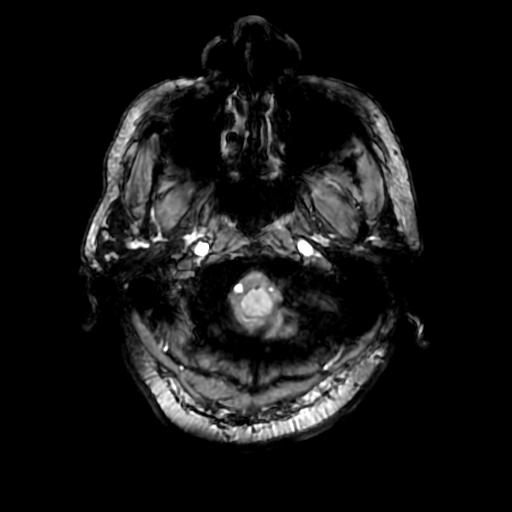
[im 47/116]
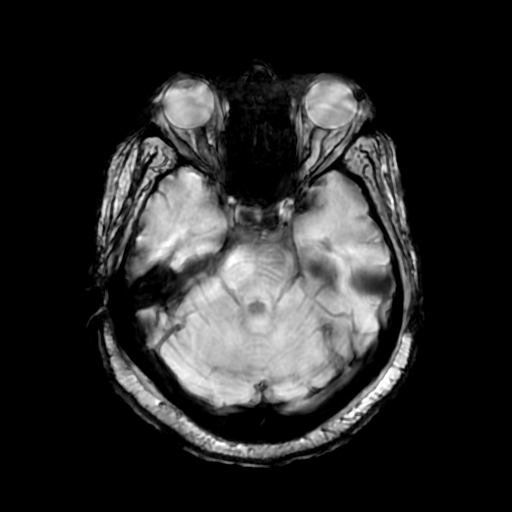
[im 70/116]
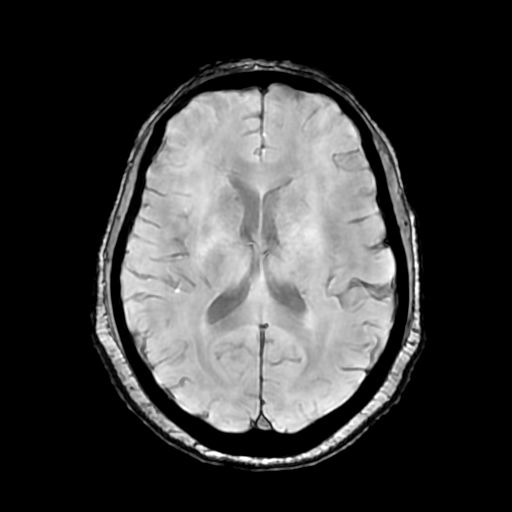

[Series 8: T2 post-contrast · coronal · 3.0mm · 0.39mm/px · 2 of 48 slices shown]
[im 1/48]
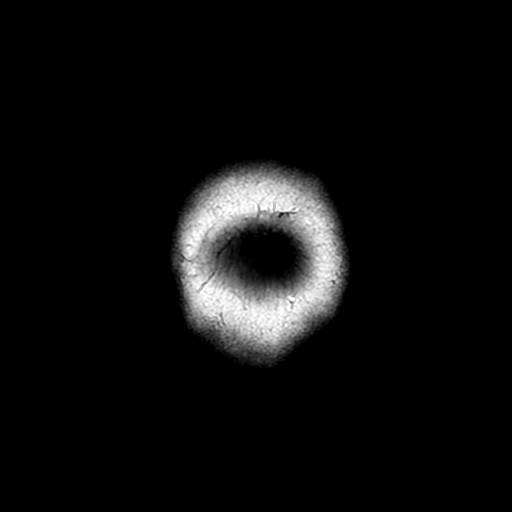
[im 48/48]
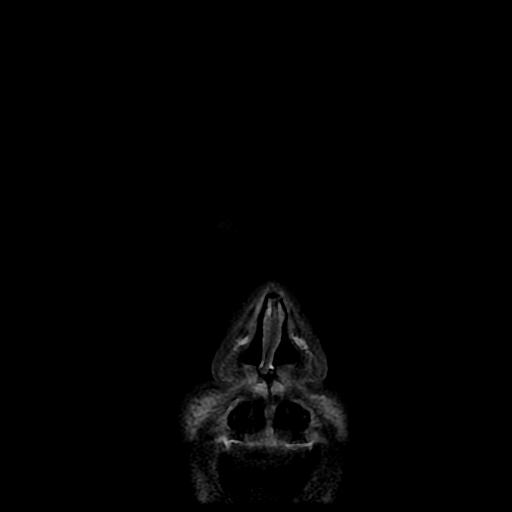

[Series 10: T1 post-contrast · coronal · 3.0mm · 0.39mm/px · 2 of 48 slices shown]
[im 1/48]
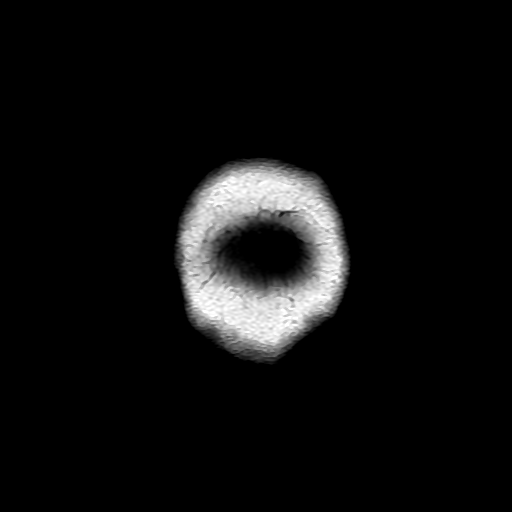
[im 48/48]
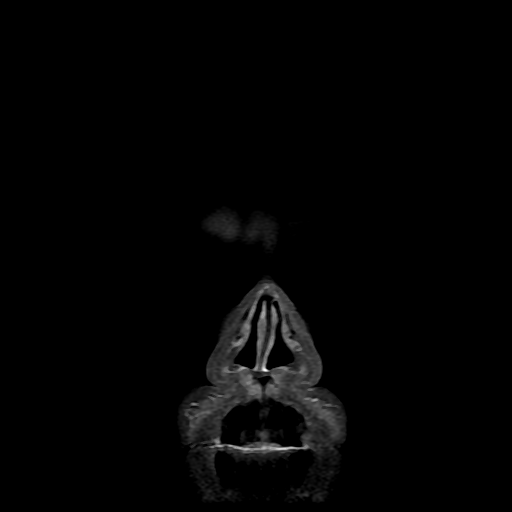

[Series 11: FLAIR post-contrast · sagittal · 3.0mm · 0.47mm/px · 2 of 36 slices shown]
[im 1/36]
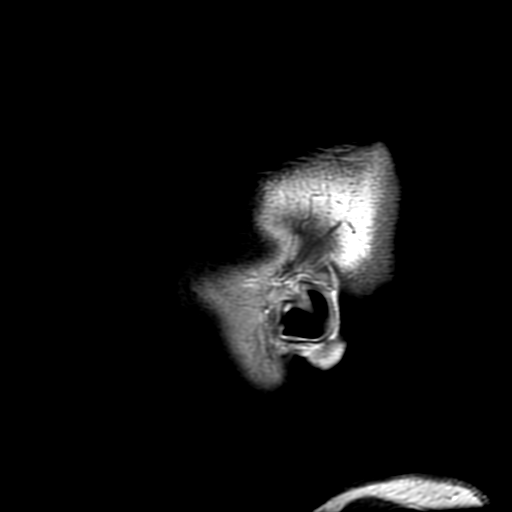
[im 36/36]
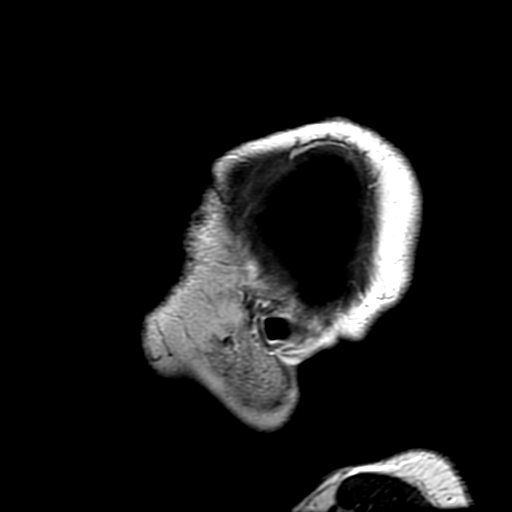

[Series 350: ADC · axial · 3.0mm · 0.94mm/px · z∈[-75,+96]mm · 3 of 58 slices shown]
[im 1/58]
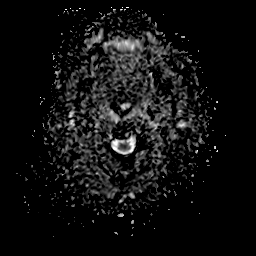
[im 29/58]
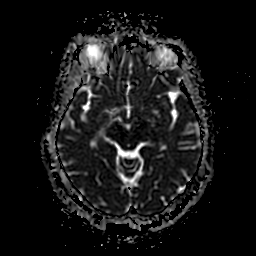
[im 58/58]
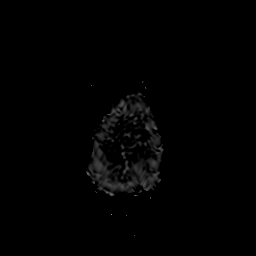

[24 of 48 positions shown; findings below may reference images not displayed]

FINDINGS: Brain: Multiple small enhancing brain lesions as follows (with
references to series [DATE] mm left temporal lobe (image 66)
2. 2 mm left periatrial white matter (image 83)
3. 3 mm left parietal lobe (image 83)
4. 3 mm right parietal lobe (image 109)
5. 6 mm right frontal lobe (image 114)
6. 2 mm posterior left frontal lobe (image 121)
7. 2 mm posterior right frontal lobe (image 125)
8. 5 mm high anterior right frontal lobe (image 130)

Some of these lesions have mildly enlarged while others were not
well demonstrated on the prior 1.5 T study which was mildly motion
degraded. There is mild edema associated with the 6 mm right frontal
lesion which has increased without mass effect. At most minimal
edema is associated with other lesions.

A band of T2 FLAIR hyperintensity in the posterior left cerebellum
is unchanged without an underlying enhancing lesion. No acute
infarct, intracranial hemorrhage, midline shift, or extra-axial
fluid collection is identified. The ventricles and sulci are normal.

Vascular: Major intracranial vascular flow voids are preserved.

Skull and upper cervical spine: No suspicious marrow lesion.
Anterior cervical spine fusion.

Sinuses/Orbits: Unremarkable orbits. Paranasal sinuses and mastoid
air cells are clear.

Other: None.
IMPRESSION: 8 brain metastases with at most mild edema.

ADDENDUM:
The study was reviewed via telephone with Dr. Ime on 09/18/2018
at [DATE] p.m. An additional punctate, subtly enhancing lesion
posteriorly in the left superior temporal gyrus measures 1-2 mm
(series 9, image 74 and series 10, image 21).

*** End of Addendum ***
FINDINGS: Brain: Multiple small enhancing brain lesions as follows (with
references to series [DATE] mm left temporal lobe (image 66)
2. 2 mm left periatrial white matter (image 83)
3. 3 mm left parietal lobe (image 83)
4. 3 mm right parietal lobe (image 109)
5. 6 mm right frontal lobe (image 114)
6. 2 mm posterior left frontal lobe (image 121)
7. 2 mm posterior right frontal lobe (image 125)
8. 5 mm high anterior right frontal lobe (image 130)

Some of these lesions have mildly enlarged while others were not
well demonstrated on the prior 1.5 T study which was mildly motion
degraded. There is mild edema associated with the 6 mm right frontal
lesion which has increased without mass effect. At most minimal
edema is associated with other lesions.

A band of T2 FLAIR hyperintensity in the posterior left cerebellum
is unchanged without an underlying enhancing lesion. No acute
infarct, intracranial hemorrhage, midline shift, or extra-axial
fluid collection is identified. The ventricles and sulci are normal.

Vascular: Major intracranial vascular flow voids are preserved.

Skull and upper cervical spine: No suspicious marrow lesion.
Anterior cervical spine fusion.

Sinuses/Orbits: Unremarkable orbits. Paranasal sinuses and mastoid
air cells are clear.

Other: None.
IMPRESSION: 8 brain metastases with at most mild edema.

## 2020-05-14 ENCOUNTER — Inpatient Hospital Stay: Payer: Medicare Other

## 2020-05-14 ENCOUNTER — Other Ambulatory Visit: Payer: Self-pay

## 2020-05-14 ENCOUNTER — Ambulatory Visit: Payer: Medicare Other | Admitting: Physician Assistant

## 2020-05-14 ENCOUNTER — Inpatient Hospital Stay: Payer: Medicare Other | Attending: Physician Assistant

## 2020-05-14 ENCOUNTER — Inpatient Hospital Stay (HOSPITAL_BASED_OUTPATIENT_CLINIC_OR_DEPARTMENT_OTHER): Payer: Medicare Other | Admitting: Internal Medicine

## 2020-05-14 VITALS — BP 138/58 | HR 66 | Temp 97.9°F | Resp 19 | Ht 61.0 in | Wt 181.3 lb

## 2020-05-14 DIAGNOSIS — C3491 Malignant neoplasm of unspecified part of right bronchus or lung: Secondary | ICD-10-CM

## 2020-05-14 DIAGNOSIS — C3412 Malignant neoplasm of upper lobe, left bronchus or lung: Secondary | ICD-10-CM | POA: Diagnosis not present

## 2020-05-14 DIAGNOSIS — I25118 Atherosclerotic heart disease of native coronary artery with other forms of angina pectoris: Secondary | ICD-10-CM

## 2020-05-14 DIAGNOSIS — Z95828 Presence of other vascular implants and grafts: Secondary | ICD-10-CM

## 2020-05-14 DIAGNOSIS — Z5112 Encounter for antineoplastic immunotherapy: Secondary | ICD-10-CM | POA: Diagnosis not present

## 2020-05-14 DIAGNOSIS — C7931 Secondary malignant neoplasm of brain: Secondary | ICD-10-CM | POA: Insufficient documentation

## 2020-05-14 DIAGNOSIS — R5383 Other fatigue: Secondary | ICD-10-CM

## 2020-05-14 DIAGNOSIS — C3492 Malignant neoplasm of unspecified part of left bronchus or lung: Secondary | ICD-10-CM

## 2020-05-14 DIAGNOSIS — Z79899 Other long term (current) drug therapy: Secondary | ICD-10-CM | POA: Diagnosis not present

## 2020-05-14 LAB — CBC WITH DIFFERENTIAL (CANCER CENTER ONLY)
Abs Immature Granulocytes: 0.01 10*3/uL (ref 0.00–0.07)
Basophils Absolute: 0.1 10*3/uL (ref 0.0–0.1)
Basophils Relative: 1 %
Eosinophils Absolute: 0.7 10*3/uL — ABNORMAL HIGH (ref 0.0–0.5)
Eosinophils Relative: 10 %
HCT: 35.7 % — ABNORMAL LOW (ref 36.0–46.0)
Hemoglobin: 11 g/dL — ABNORMAL LOW (ref 12.0–15.0)
Immature Granulocytes: 0 %
Lymphocytes Relative: 25 %
Lymphs Abs: 1.7 10*3/uL (ref 0.7–4.0)
MCH: 24.5 pg — ABNORMAL LOW (ref 26.0–34.0)
MCHC: 30.8 g/dL (ref 30.0–36.0)
MCV: 79.5 fL — ABNORMAL LOW (ref 80.0–100.0)
Monocytes Absolute: 0.6 10*3/uL (ref 0.1–1.0)
Monocytes Relative: 8 %
Neutro Abs: 3.9 10*3/uL (ref 1.7–7.7)
Neutrophils Relative %: 56 %
Platelet Count: 243 10*3/uL (ref 150–400)
RBC: 4.49 MIL/uL (ref 3.87–5.11)
RDW: 15.6 % — ABNORMAL HIGH (ref 11.5–15.5)
WBC Count: 7 10*3/uL (ref 4.0–10.5)
nRBC: 0 % (ref 0.0–0.2)

## 2020-05-14 LAB — CMP (CANCER CENTER ONLY)
ALT: 13 U/L (ref 0–44)
AST: 20 U/L (ref 15–41)
Albumin: 3.7 g/dL (ref 3.5–5.0)
Alkaline Phosphatase: 72 U/L (ref 38–126)
Anion gap: 13 (ref 5–15)
BUN: 15 mg/dL (ref 8–23)
CO2: 23 mmol/L (ref 22–32)
Calcium: 9.1 mg/dL (ref 8.9–10.3)
Chloride: 105 mmol/L (ref 98–111)
Creatinine: 1.71 mg/dL — ABNORMAL HIGH (ref 0.44–1.00)
GFR, Estimated: 33 mL/min — ABNORMAL LOW (ref >60)
Glucose, Bld: 173 mg/dL — ABNORMAL HIGH (ref 70–99)
Potassium: 3.9 mmol/L (ref 3.5–5.1)
Sodium: 141 mmol/L (ref 135–145)
Total Bilirubin: 0.4 mg/dL (ref 0.3–1.2)
Total Protein: 7.3 g/dL (ref 6.5–8.1)

## 2020-05-14 LAB — TSH: TSH: 2.152 u[IU]/mL (ref 0.308–3.960)

## 2020-05-14 MED ORDER — SODIUM CHLORIDE 0.9% FLUSH
10.0000 mL | INTRAVENOUS | Status: DC | PRN
  Administered 2020-05-14: 10 mL
  Filled 2020-05-14: qty 10

## 2020-05-14 MED ORDER — SODIUM CHLORIDE 0.9 % IV SOLN
200.0000 mg | Freq: Once | INTRAVENOUS | Status: AC
  Administered 2020-05-14: 200 mg via INTRAVENOUS
  Filled 2020-05-14: qty 8

## 2020-05-14 MED ORDER — HEPARIN SOD (PORK) LOCK FLUSH 100 UNIT/ML IV SOLN
500.0000 [IU] | Freq: Once | INTRAVENOUS | Status: AC | PRN
  Administered 2020-05-14: 500 [IU]
  Filled 2020-05-14: qty 5

## 2020-05-14 MED ORDER — SODIUM CHLORIDE 0.9 % IV SOLN
Freq: Once | INTRAVENOUS | Status: AC
  Filled 2020-05-14: qty 250

## 2020-05-14 NOTE — Patient Instructions (Signed)

## 2020-05-14 NOTE — Patient Instructions (Signed)
[facility] Discharge Instructions for Patients Receiving Chemotherapy  Today you received the following chemotherapy agents: Pembrolizumab Beryle Flock)  To help prevent nausea and vomiting after your treatment, we encourage you to take your nausea medication  as prescribed.    If you develop nausea and vomiting that is not controlled by your nausea medication, call the clinic.   BELOW ARE SYMPTOMS THAT SHOULD BE REPORTED IMMEDIATELY:  *FEVER GREATER THAN 100.5 F  *CHILLS WITH OR WITHOUT FEVER  NAUSEA AND VOMITING THAT IS NOT CONTROLLED WITH YOUR NAUSEA MEDICATION  *UNUSUAL SHORTNESS OF BREATH  *UNUSUAL BRUISING OR BLEEDING  TENDERNESS IN MOUTH AND THROAT WITH OR WITHOUT PRESENCE OF ULCERS  *URINARY PROBLEMS  *BOWEL PROBLEMS  UNUSUAL RASH Items with * indicate a potential emergency and should be followed up as soon as possible.  Feel free to call the clinic should you have any questions or concerns. The clinic phone number is [phone].  Please show the [facility] at check-in to the Emergency Department and triage nurse.

## 2020-05-14 NOTE — Progress Notes (Signed)
    [facility] [phone]   [phone]  OFFICE PROGRESS NOTE  [name], Carolyn Ellen, FNP [address] [place] [address]  DIAGNOSIS: Stage IV (T1c, N1, M1c ) non-small cell lung cancer, adenocarcinoma diagnosed in July 2020 and presented with left upper lobe lung nodule in addition to right upper lobe lung nodule with left hilar lymphadenopathy as well as multiple brain metastasis.  Biomarker Findings Microsatellite status - MS-Stable Tumor Mutational Burden - 4 Muts/Mb Genomic Findings For a complete list of the genes assayed, please refer to the Appendix. ATM G204* KRAS G12C PDGFRA P122T SMO R199W 7 Disease relevant genes with no reportable alterations: ALK, BRAF, EGFR, ERBB2, MET, RET, ROS1  PDL 1 expression was 1%  PRIOR THERAPY: SBRT to multiple brain metastasis under the care of Dr. Squire.  CURRENT THERAPY: Systemic chemotherapy with carboplatin for AUC of 5, Alimta 500 mg/M2 and Keytruda 200 mg IV every 3 weeks.  First dose October 05, 2018.  Status post 28 cycles.  Starting from cycle #5 the patient will be on treatment with maintenance Alimta 500 mg/M2 and Keytruda 200 mg IV every 3 weeks.  She has been on treatment with single agent Keytruda recently secondary to renal insufficiency.  INTERVAL HISTORY: Carolyn [name] 65 y.o. female returns to the clinic today for follow-up visit.  The patient is feeling fine today with no concerning complaints except for right wrist pain.  She has a history of similar problem in the past when she was packing boxes.  She denied having any significant chest pain, shortness of breath, cough or hemoptysis.  She denied having any fever or chills.  She has no nausea, vomiting, diarrhea or constipation.  She has no headache or visual changes.  She continues to tolerate her maintenance treatment with single agent Keytruda fairly well.  The patient is here today for evaluation before starting  cycle #29 of her treatment.   MEDICAL HISTORY: Past Medical History:  Diagnosis Date  . Anginal pain (HCC)    " with exertion "  . Arthritis    " 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 "  . Asthma    h/o  . Coronary artery disease   . Diabetes mellitus without complication (HCC)    Type II  . GERD (gastroesophageal reflux disease)   . H/O hiatal hernia   . Heart murmur   . History of radiation therapy 09/22/2018   stereotactic radiosurgery   . 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 Hypertension   . nscl ca dx'd 08/07/18  . Persistent headaches    h/o  . Pneumonia    hx of PNA  . Shortness of breath   . Vaginal dryness    h/o    ALLERGIES:  is allergic to lisinopril.  MEDICATIONS:  Current Outpatient Medications  Medication Sig Dispense Refill  . acetaminophen (TYLENOL) 500 MG tablet Take 1,000 mg by mouth every 8 (eight) hours as needed for mild pain, fever or headache.     . amLODipine (NORVASC) 10 MG tablet Take 1 tablet (10 mg total) by mouth daily. 90 tablet 3  . aspirin EC 81 MG tablet Take 81 mg by mouth daily.    . azelastine (ASTELIN) 0.1 % nasal spray 1 spray ea nostril    . carvedilol (COREG) 25 MG tablet Take 1/2 (one-half) tablet by mouth twice daily 180 tablet 0  . Cholecalciferol (VITAMIN D-3) 125 MCG (5000 UT) TABS Take 5,000 Units by   mouth daily.    . Chromium Picolinate 1000 MCG TABS Take 1,000 mg by mouth daily.    . esomeprazole (NEXIUM) 20 MG capsule Take 1 capsule by mouth daily.    . ezetimibe (ZETIA) 10 MG tablet Take 1 tablet (10 mg total) by mouth daily after supper. 30 tablet 2  . fexofenadine (ALLEGRA) 180 MG tablet 1 tablet swallow whole with water; do not take with fruit juices.    . folic acid (FOLVITE) 1 MG tablet folic acid 1 mg tablet    . Lancets (ONETOUCH DELICA PLUS LANCET33G) MISC SMARTSIG:1 Topical 3 Times Daily    . lidocaine-prilocaine (EMLA) cream Apply to the Port-A-Cath site 30 to 60 minutes before chemotherapy. 30 g 2  . metFORMIN (GLUCOPHAGE) 500 MG tablet Take  500 mg by mouth 2 (two) times daily with a meal.     . methocarbamol (ROBAXIN) 500 MG tablet Take 1 tablet (500 mg total) by mouth every 6 (six) hours as needed for muscle spasms. 21 tablet 0  . montelukast (SINGULAIR) 10 MG tablet 1 tablet    . nitroGLYCERIN (NITROSTAT) 0.4 MG SL tablet Place 1 tablet (0.4 mg total) under the tongue every 5 (five) minutes as needed for chest pain. 25 tablet 2  . omega-3 acid ethyl esters (LOVAZA) 1 g capsule Lovaza 1 gram capsule  Take 2 capsules twice a day by oral route.    . ONETOUCH ULTRA test strip USE 1 STRIP TO CHECK GLUCOSE THREE TIMES DAILY    . Pitavastatin Calcium (LIVALO) 4 MG TABS Take 4 mg by mouth at bedtime.     . Polyethyl Glycol-Propyl Glycol 0.4-0.3 % SOLN Place 1 drop into both eyes every 6 (six) hours as needed (dry eyes).     . telmisartan (MICARDIS) 40 MG tablet Take 1 tablet by mouth daily.     No current facility-administered medications for this visit.    SURGICAL HISTORY:  Past Surgical History:  Procedure Laterality Date  . ABDOMINAL HYSTERECTOMY    . bone spur    . CARDIAC CATHETERIZATION  06/13/2012  . carpel tunnel surgery Left   . COLONOSCOPY     9 years ago  . CORONARY ANGIOPLASTY  06/13/2012  . EYE SURGERY     cyst left eye   . IR IMAGING GUIDED PORT INSERTION  10/10/2018  . LEFT HEART CATHETERIZATION WITH CORONARY ANGIOGRAM N/A 06/13/2012   Procedure: LEFT HEART CATHETERIZATION WITH CORONARY ANGIOGRAM;  Surgeon: Jagadeesh R Ganji, MD;  Location: [facility] CATH LAB;  Service: Cardiovascular;  Laterality: N/A;  . NECK SURGERY    . rotator cuff surgery Right 2015  . VIDEO BRONCHOSCOPY WITH ENDOBRONCHIAL NAVIGATION N/A 09/06/2018   Procedure: VIDEO BRONCHOSCOPY WITH ENDOBRONCHIAL NAVIGATION, left upper lung;  Surgeon: Lightfoot, Harrell O, MD;  Location: [facility] OR;  Service: Thoracic;  Laterality: N/A;  . VIDEO BRONCHOSCOPY WITH ENDOBRONCHIAL ULTRASOUND Left 09/06/2018   Procedure: VIDEO BRONCHOSCOPY WITH ENDOBRONCHIAL ULTRASOUND, left  lung;  Surgeon: Lightfoot, Harrell O, MD;  Location: [facility] OR;  Service: Thoracic;  Laterality: Left;  . WISDOM TOOTH EXTRACTION      REVIEW OF SYSTEMS:  A comprehensive review of systems was negative except for: Musculoskeletal: positive for arthralgias   PHYSICAL EXAMINATION: General appearance: alert, cooperative and no distress Head: Normocephalic, without obvious abnormality, atraumatic Neck: no adenopathy, no JVD, supple, symmetrical, trachea midline and thyroid not enlarged, symmetric, no tenderness/mass/nodules Lymph nodes: Cervical, supraclavicular, and axillary nodes normal. Resp: clear to auscultation bilaterally Back: symmetric, no curvature. ROM normal.   No CVA tenderness. Cardio: regular rate and rhythm, S1, S2 normal, no murmur, click, rub or gallop GI: soft, non-tender; bowel sounds normal; no masses,  no organomegaly Extremities: extremities normal, atraumatic, no cyanosis or edema  ECOG PERFORMANCE STATUS: 1 - Symptomatic but completely ambulatory  Blood pressure (!) 138/58, pulse 66, temperature 97.9 F (36.6 C), temperature source Tympanic, resp. rate 19, height 5' 1" (1.549 m), weight 181 lb 4.8 oz (82.2 kg), SpO2 100 %.  LABORATORY DATA: Lab Results  Component Value Date   WBC 7.0 05/14/2020   HGB 11.0 (L) 05/14/2020   HCT 35.7 (L) 05/14/2020   MCV 79.5 (L) 05/14/2020   PLT 243 05/14/2020      Chemistry      Component Value Date/Time   NA 139 04/23/2020 0827   K 4.0 04/23/2020 0827   CL 105 04/23/2020 0827   CO2 26 04/23/2020 0827   BUN 16 04/23/2020 0827   CREATININE 1.71 (H) 04/23/2020 0827      Component Value Date/Time   CALCIUM 9.2 04/23/2020 0827   ALKPHOS 73 04/23/2020 0827   AST 16 04/23/2020 0827   ALT 11 04/23/2020 0827   BILITOT 0.2 (L) 04/23/2020 0827       RADIOGRAPHIC STUDIES: CT Abdomen Pelvis Wo Contrast  Result Date: 04/21/2020 CLINICAL DATA:  Non-small-cell lung cancer.  Restaging. EXAM: CT CHEST, ABDOMEN AND PELVIS WITHOUT  CONTRAST TECHNIQUE: Multidetector CT imaging of the chest, abdomen and pelvis was performed following the standard protocol without IV contrast. COMPARISON:  01/31/2020 FINDINGS: CT CHEST FINDINGS Cardiovascular: The heart size is normal. Tiny anterior pericardial effusion is similar. Coronary artery calcification is evident. Atherosclerotic calcification is noted in the wall of the thoracic aorta. Right Port-A-Cath tip is positioned in the proximal to mid SVC. Mediastinum/Nodes: No mediastinal lymphadenopathy. No evidence for gross hilar lymphadenopathy although assessment is limited by the lack of intravenous contrast on today's study. The esophagus has normal imaging features. There is no axillary lymphadenopathy. Lungs/Pleura: Stable appearance left upper lobe parenchymal scarring including 8 mm nodular component on 43/4. Peripheral right upper lobe nodular opacity (36/4) is also not appreciably changed in the interval. The 10 mm posterior right lower lobe ground-glass opacity identified as new on the previous exam has resolved in the interval. No new suspicious pulmonary nodule or mass. No focal airspace consolidation. No pleural effusion. Musculoskeletal: No worrisome lytic or sclerotic osseous abnormality. CT ABDOMEN PELVIS FINDINGS Hepatobiliary: No focal abnormality in the liver on this study without intravenous contrast. There is no evidence for gallstones, gallbladder wall thickening, or pericholecystic fluid. No intrahepatic or extrahepatic biliary dilation. Pancreas: No focal mass lesion. No dilatation of the main duct. No intraparenchymal cyst. No peripancreatic edema. Spleen: No splenomegaly. No focal mass lesion. Adrenals/Urinary Tract: No adrenal nodule or mass. Unremarkable noncontrast appearance of the kidneys. No evidence for hydroureter. The urinary bladder appears normal for the degree of distention. Stomach/Bowel: Stomach is distended with contrast material. Duodenum is normally positioned as  is the ligament of Treitz. Duodenal diverticulum noted. No small bowel wall thickening. No small bowel dilatation. The terminal ileum is normal. The appendix is normal. No gross colonic mass. No colonic wall thickening. Vascular/Lymphatic: There is abdominal aortic atherosclerosis without aneurysm. There is no gastrohepatic or hepatoduodenal ligament lymphadenopathy. No retroperitoneal or mesenteric lymphadenopathy. No pelvic sidewall lymphadenopathy. Reproductive: The uterus is surgically absent. There is no adnexal mass. Other: No intraperitoneal free fluid. Musculoskeletal: No worrisome lytic or sclerotic osseous abnormality. IMPRESSION: 1. Stable exam. No new   or progressive findings to suggest recurrent or metastatic disease in the chest, abdomen, or pelvis. 2. The 10 mm posterior right lower lobe ground-glass opacity identified as new on the previous exam has resolved in the interval. 3. Aortic Atherosclerosis (ICD10-I70.0). Electronically Signed   By: Eric  Mansell M.D.   On: 04/21/2020 08:14   CT Chest Wo Contrast  Result Date: 04/21/2020 CLINICAL DATA:  Non-small-cell lung cancer.  Restaging. EXAM: CT CHEST, ABDOMEN AND PELVIS WITHOUT CONTRAST TECHNIQUE: Multidetector CT imaging of the chest, abdomen and pelvis was performed following the standard protocol without IV contrast. COMPARISON:  01/31/2020 FINDINGS: CT CHEST FINDINGS Cardiovascular: The heart size is normal. Tiny anterior pericardial effusion is similar. Coronary artery calcification is evident. Atherosclerotic calcification is noted in the wall of the thoracic aorta. Right Port-A-Cath tip is positioned in the proximal to mid SVC. Mediastinum/Nodes: No mediastinal lymphadenopathy. No evidence for gross hilar lymphadenopathy although assessment is limited by the lack of intravenous contrast on today's study. The esophagus has normal imaging features. There is no axillary lymphadenopathy. Lungs/Pleura: Stable appearance left upper lobe  parenchymal scarring including 8 mm nodular component on 43/4. Peripheral right upper lobe nodular opacity (36/4) is also not appreciably changed in the interval. The 10 mm posterior right lower lobe ground-glass opacity identified as new on the previous exam has resolved in the interval. No new suspicious pulmonary nodule or mass. No focal airspace consolidation. No pleural effusion. Musculoskeletal: No worrisome lytic or sclerotic osseous abnormality. CT ABDOMEN PELVIS FINDINGS Hepatobiliary: No focal abnormality in the liver on this study without intravenous contrast. There is no evidence for gallstones, gallbladder wall thickening, or pericholecystic fluid. No intrahepatic or extrahepatic biliary dilation. Pancreas: No focal mass lesion. No dilatation of the main duct. No intraparenchymal cyst. No peripancreatic edema. Spleen: No splenomegaly. No focal mass lesion. Adrenals/Urinary Tract: No adrenal nodule or mass. Unremarkable noncontrast appearance of the kidneys. No evidence for hydroureter. The urinary bladder appears normal for the degree of distention. Stomach/Bowel: Stomach is distended with contrast material. Duodenum is normally positioned as is the ligament of Treitz. Duodenal diverticulum noted. No small bowel wall thickening. No small bowel dilatation. The terminal ileum is normal. The appendix is normal. No gross colonic mass. No colonic wall thickening. Vascular/Lymphatic: There is abdominal aortic atherosclerosis without aneurysm. There is no gastrohepatic or hepatoduodenal ligament lymphadenopathy. No retroperitoneal or mesenteric lymphadenopathy. No pelvic sidewall lymphadenopathy. Reproductive: The uterus is surgically absent. There is no adnexal mass. Other: No intraperitoneal free fluid. Musculoskeletal: No worrisome lytic or sclerotic osseous abnormality. IMPRESSION: 1. Stable exam. No new or progressive findings to suggest recurrent or metastatic disease in the chest, abdomen, or pelvis. 2.  The 10 mm posterior right lower lobe ground-glass opacity identified as new on the previous exam has resolved in the interval. 3. Aortic Atherosclerosis (ICD10-I70.0). Electronically Signed   By: Eric  Mansell M.D.   On: 04/21/2020 08:14    ASSESSMENT AND PLAN: This is a very pleasant 64 years old African-American female with likely stage IV (T1c, N1, M1C) non-small cell lung cancer, adenocarcinoma with K-ras G12C mutation diagnosed in July 2020 and presented with left upper lobe lung nodule in addition to right upper lobe pulmonary nodule as well as left hilar adenopathy and metastatic lesion to the brain. Molecular studies showed no actionable mutation and PDL 1 expression was 1%. The patient underwent SBRT to the metastatic brain lesion under the care of Dr. Squire. The patient is currently undergoing systemic chemotherapy with carboplatin for AUC of   5, Alimta 500 mg/M2 and Keytruda 200 mg IV every 3 weeks is status post 28 cycles.  Starting from cycle #5 she is on maintenance treatment with Alimta and Keytruda every 3 weeks with only Keytruda on the last few cycles because of the renal insufficiency. The patient continues to tolerate her treatment with Keytruda fairly well. I recommended for her to proceed with cycle #29 today as planned. The patient will come back for follow-up visit in 3 weeks for evaluation before starting cycle #30. For the renal insufficiency she is followed by nephrology. She was advised to call immediately if she has any other concerning symptoms in the interval. The patient voices understanding of current disease status and treatment options and is in agreement with the current care plan. All questions were answered. The patient knows to call the clinic with any problems, questions or concerns. We can certainly see the patient much sooner if necessary.  Disclaimer: This note was dictated with voice recognition software. Similar sounding words can inadvertently be  transcribed and may not be corrected upon review.       

## 2020-05-16 ENCOUNTER — Telehealth: Payer: Self-pay | Admitting: Internal Medicine

## 2020-05-16 NOTE — Telephone Encounter (Signed)
Scheduled per los. Called and left msg. Mailed printout  °

## 2020-05-21 ENCOUNTER — Telehealth: Payer: Self-pay | Admitting: Medical Oncology

## 2020-05-21 DIAGNOSIS — R768 Other specified abnormal immunological findings in serum: Secondary | ICD-10-CM | POA: Diagnosis not present

## 2020-05-21 DIAGNOSIS — M549 Dorsalgia, unspecified: Secondary | ICD-10-CM | POA: Diagnosis not present

## 2020-05-21 DIAGNOSIS — N1831 Chronic kidney disease, stage 3a: Secondary | ICD-10-CM | POA: Diagnosis not present

## 2020-05-21 DIAGNOSIS — M25539 Pain in unspecified wrist: Secondary | ICD-10-CM | POA: Diagnosis not present

## 2020-05-21 DIAGNOSIS — M654 Radial styloid tenosynovitis [de Quervain]: Secondary | ICD-10-CM | POA: Diagnosis not present

## 2020-05-21 DIAGNOSIS — M2550 Pain in unspecified joint: Secondary | ICD-10-CM | POA: Diagnosis not present

## 2020-05-21 DIAGNOSIS — M1990 Unspecified osteoarthritis, unspecified site: Secondary | ICD-10-CM | POA: Diagnosis not present

## 2020-05-21 DIAGNOSIS — C3490 Malignant neoplasm of unspecified part of unspecified bronchus or lung: Secondary | ICD-10-CM | POA: Diagnosis not present

## 2020-05-21 NOTE — Telephone Encounter (Signed)
Is it okay for her to get a steroid injection into her thumb joint? Per Julien Nordmann , I told pt it is okay.

## 2020-06-04 ENCOUNTER — Inpatient Hospital Stay: Payer: Medicare Other

## 2020-06-04 ENCOUNTER — Inpatient Hospital Stay (HOSPITAL_BASED_OUTPATIENT_CLINIC_OR_DEPARTMENT_OTHER): Payer: Medicare Other | Admitting: Internal Medicine

## 2020-06-04 ENCOUNTER — Other Ambulatory Visit: Payer: Medicare Other

## 2020-06-04 ENCOUNTER — Other Ambulatory Visit: Payer: Self-pay

## 2020-06-04 VITALS — BP 141/66 | HR 70 | Temp 97.1°F | Resp 20 | Ht 61.0 in | Wt 181.3 lb

## 2020-06-04 DIAGNOSIS — I25118 Atherosclerotic heart disease of native coronary artery with other forms of angina pectoris: Secondary | ICD-10-CM | POA: Diagnosis not present

## 2020-06-04 DIAGNOSIS — C3412 Malignant neoplasm of upper lobe, left bronchus or lung: Secondary | ICD-10-CM | POA: Diagnosis not present

## 2020-06-04 DIAGNOSIS — Z5112 Encounter for antineoplastic immunotherapy: Secondary | ICD-10-CM | POA: Diagnosis not present

## 2020-06-04 DIAGNOSIS — Z79899 Other long term (current) drug therapy: Secondary | ICD-10-CM | POA: Diagnosis not present

## 2020-06-04 DIAGNOSIS — C3492 Malignant neoplasm of unspecified part of left bronchus or lung: Secondary | ICD-10-CM

## 2020-06-04 DIAGNOSIS — R5383 Other fatigue: Secondary | ICD-10-CM

## 2020-06-04 DIAGNOSIS — C3491 Malignant neoplasm of unspecified part of right bronchus or lung: Secondary | ICD-10-CM

## 2020-06-04 DIAGNOSIS — C7931 Secondary malignant neoplasm of brain: Secondary | ICD-10-CM | POA: Diagnosis not present

## 2020-06-04 LAB — CBC WITH DIFFERENTIAL (CANCER CENTER ONLY)
Abs Immature Granulocytes: 0.04 10*3/uL (ref 0.00–0.07)
Basophils Absolute: 0 10*3/uL (ref 0.0–0.1)
Basophils Relative: 1 %
Eosinophils Absolute: 0.7 10*3/uL — ABNORMAL HIGH (ref 0.0–0.5)
Eosinophils Relative: 8 %
HCT: 36.2 % (ref 36.0–46.0)
Hemoglobin: 10.9 g/dL — ABNORMAL LOW (ref 12.0–15.0)
Immature Granulocytes: 1 %
Lymphocytes Relative: 18 %
Lymphs Abs: 1.6 10*3/uL (ref 0.7–4.0)
MCH: 24.1 pg — ABNORMAL LOW (ref 26.0–34.0)
MCHC: 30.1 g/dL (ref 30.0–36.0)
MCV: 79.9 fL — ABNORMAL LOW (ref 80.0–100.0)
Monocytes Absolute: 0.7 10*3/uL (ref 0.1–1.0)
Monocytes Relative: 8 %
Neutro Abs: 5.7 10*3/uL (ref 1.7–7.7)
Neutrophils Relative %: 64 %
Platelet Count: 262 10*3/uL (ref 150–400)
RBC: 4.53 MIL/uL (ref 3.87–5.11)
RDW: 15.9 % — ABNORMAL HIGH (ref 11.5–15.5)
WBC Count: 8.7 10*3/uL (ref 4.0–10.5)
nRBC: 0 % (ref 0.0–0.2)

## 2020-06-04 LAB — CMP (CANCER CENTER ONLY)
ALT: 15 U/L (ref 0–44)
AST: 28 U/L (ref 15–41)
Albumin: 3.5 g/dL (ref 3.5–5.0)
Alkaline Phosphatase: 82 U/L (ref 38–126)
Anion gap: 11 (ref 5–15)
BUN: 14 mg/dL (ref 8–23)
CO2: 25 mmol/L (ref 22–32)
Calcium: 9.1 mg/dL (ref 8.9–10.3)
Chloride: 106 mmol/L (ref 98–111)
Creatinine: 1.71 mg/dL — ABNORMAL HIGH (ref 0.44–1.00)
GFR, Estimated: 33 mL/min — ABNORMAL LOW (ref >60)
Glucose, Bld: 171 mg/dL — ABNORMAL HIGH (ref 70–99)
Potassium: 3.9 mmol/L (ref 3.5–5.1)
Sodium: 142 mmol/L (ref 135–145)
Total Bilirubin: 0.4 mg/dL (ref 0.3–1.2)
Total Protein: 7.4 g/dL (ref 6.5–8.1)

## 2020-06-04 LAB — TSH: TSH: 2.023 u[IU]/mL (ref 0.308–3.960)

## 2020-06-04 MED ORDER — SODIUM CHLORIDE 0.9 % IV SOLN
200.0000 mg | Freq: Once | INTRAVENOUS | Status: AC
  Administered 2020-06-04: 200 mg via INTRAVENOUS
  Filled 2020-06-04: qty 8

## 2020-06-04 MED ORDER — SODIUM CHLORIDE 0.9 % IV SOLN
Freq: Once | INTRAVENOUS | Status: AC
  Filled 2020-06-04: qty 250

## 2020-06-04 MED ORDER — SODIUM CHLORIDE 0.9% FLUSH
10.0000 mL | INTRAVENOUS | Status: DC | PRN
Start: 2020-06-04 — End: 2020-06-04
  Administered 2020-06-04: 10 mL
  Filled 2020-06-04: qty 10

## 2020-06-04 MED ORDER — HEPARIN SOD (PORK) LOCK FLUSH 100 UNIT/ML IV SOLN
500.0000 [IU] | Freq: Once | INTRAVENOUS | Status: AC | PRN
  Administered 2020-06-04: 500 [IU]
  Filled 2020-06-04: qty 5

## 2020-06-04 NOTE — Progress Notes (Signed)
[place] [phone]   [phone]  OFFICE PROGRESS NOTE  [name], [place] [address]  DIAGNOSIS: Stage IV (T1c, N1, M1c ) non-small cell lung cancer, adenocarcinoma diagnosed in July 2020 and presented with left upper lobe lung nodule in addition to right upper lobe lung nodule with left hilar lymphadenopathy as well as multiple brain metastasis.  Biomarker Findings Microsatellite status - MS-Stable Tumor Mutational Burden - 4 Muts/Mb Genomic Findings For a complete list of the genes assayed, please refer to the Appendix. ATM G204* KRAS G12C PDGFRA P122T SMO R199W 7 Disease relevant genes with no reportable alterations: ALK, BRAF, EGFR, ERBB2, MET, RET, ROS1  PDL 1 expression was 1%  PRIOR THERAPY: SBRT to multiple brain metastasis under the care of Dr. Isidore Moos.  CURRENT THERAPY: Systemic chemotherapy with carboplatin for AUC of 5, Alimta 500 mg/M2 and Keytruda 200 mg IV every 3 weeks.  First dose October 05, 2018.  Status post 29 cycles.  Starting from cycle #5 the patient will be on treatment with maintenance Alimta 500 mg/M2 and Keytruda 200 mg IV every 3 weeks.  She has been on treatment with single agent Keytruda recently secondary to renal insufficiency.  INTERVAL HISTORY: Carolyn [name] 65 y.o. female returns to the clinic today for follow-up visit.  The patient is feeling fine today with no concerning complaints.  She denied having any current chest pain, shortness of breath, cough or hemoptysis.  She denied having any fever or chills.  She has no nausea, vomiting, diarrhea or constipation.  She has no headache or visual changes.  The patient denied having any significant weight loss or night sweats.  She has been tolerating her treatment with Keytruda fairly well.  She is here today for evaluation before starting cycle #30.   MEDICAL HISTORY: Past Medical History:  Diagnosis Date   . Anginal pain ([place])    " with exertion "  . Arthritis    " 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 "  . Asthma    h/o  . Coronary artery disease   . Diabetes mellitus without complication ([place])    Type II  . GERD (gastroesophageal reflux disease)   . H/O hiatal hernia   . Heart murmur   . History of radiation therapy 09/22/2018   stereotactic radiosurgery   . 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 Hypertension   . nscl ca dx'd 08/07/18  . Persistent headaches    h/o  . Pneumonia    hx of PNA  . Shortness of breath   . Vaginal dryness    h/o    ALLERGIES:  is allergic to lisinopril.  MEDICATIONS:  Current Outpatient Medications  Medication Sig Dispense Refill  . acetaminophen (TYLENOL) 500 MG tablet Take 1,000 mg by mouth every 8 (eight) hours as needed for mild pain, fever or headache.     Marland Kitchen amLODipine (NORVASC) 10 MG tablet Take 1 tablet (10 mg total) by mouth daily. 90 tablet 3  . aspirin EC 81 MG tablet Take 81 mg by mouth daily.    Marland Kitchen azelastine (ASTELIN) 0.1 % nasal spray 1 spray ea nostril    . carvedilol (COREG) 25 MG tablet Take 1/2 (one-half) tablet by mouth twice daily 180 tablet 0  . Cholecalciferol (VITAMIN D-3) 125 MCG (5000 UT) TABS Take 5,000 Units by mouth daily.    . Chromium Picolinate 1000 MCG TABS Take 1,000 mg by mouth  daily.    . esomeprazole (NEXIUM) 20 MG capsule Take 1 capsule by mouth daily.    Marland Kitchen ezetimibe (ZETIA) 10 MG tablet Take 1 tablet (10 mg total) by mouth daily after supper. 30 tablet 2  . fexofenadine (ALLEGRA) 180 MG tablet 1 tablet swallow whole with water; do not take with fruit juices.    . folic acid (FOLVITE) 1 MG tablet folic acid 1 mg tablet    . Lancets (ONETOUCH DELICA PLUS [id number]) MISC SMARTSIG:1 Topical 3 Times Daily    . lidocaine-prilocaine (EMLA) cream Apply to the Port-A-Cath site 30 to 60 minutes before chemotherapy. 30 g 2  . metFORMIN (GLUCOPHAGE) 500 MG tablet Take 500 mg by mouth 2 (two) times daily with a meal.     . methocarbamol (ROBAXIN) 500 MG  tablet Take 1 tablet (500 mg total) by mouth every 6 (six) hours as needed for muscle spasms. 21 tablet 0  . montelukast (SINGULAIR) 10 MG tablet 1 tablet    . nitroGLYCERIN (NITROSTAT) 0.4 MG SL tablet Place 1 tablet (0.4 mg total) under the tongue every 5 (five) minutes as needed for chest pain. 25 tablet 2  . omega-3 acid ethyl esters (LOVAZA) 1 g capsule Lovaza 1 gram capsule  Take 2 capsules twice a day by oral route.    Glory Rosebush ULTRA test strip USE 1 STRIP TO CHECK GLUCOSE THREE TIMES DAILY    . Pitavastatin Calcium (LIVALO) 4 MG TABS Take 4 mg by mouth at bedtime.     Vladimir Faster Glycol-Propyl Glycol 0.4-0.3 % SOLN Place 1 drop into both eyes every 6 (six) hours as needed (dry eyes).     Marland Kitchen telmisartan (MICARDIS) 40 MG tablet Take 1 tablet by mouth daily.     No current facility-administered medications for this visit.    SURGICAL HISTORY:  Past Surgical History:  Procedure Laterality Date  . ABDOMINAL HYSTERECTOMY    . bone spur    . CARDIAC CATHETERIZATION  06/13/2012  . carpel tunnel surgery Left   . COLONOSCOPY     9 years ago  . CORONARY ANGIOPLASTY  06/13/2012  . EYE SURGERY     cyst left eye   . IR IMAGING GUIDED PORT INSERTION  10/10/2018  . LEFT HEART CATHETERIZATION WITH CORONARY ANGIOGRAM N/A 06/13/2012   Procedure: LEFT HEART CATHETERIZATION WITH CORONARY ANGIOGRAM;  Surgeon: Laverda Page, MD;  Location: [facility];  Service: Cardiovascular;  Laterality: N/A;  . NECK SURGERY    . rotator cuff surgery Right 2015  . VIDEO BRONCHOSCOPY WITH ENDOBRONCHIAL NAVIGATION N/A 09/06/2018   Procedure: VIDEO BRONCHOSCOPY WITH ENDOBRONCHIAL NAVIGATION, left upper lung;  Surgeon: Lajuana Matte, MD;  Location: [facility];  Service: Thoracic;  Laterality: N/A;  . VIDEO BRONCHOSCOPY WITH ENDOBRONCHIAL ULTRASOUND Left 09/06/2018   Procedure: VIDEO BRONCHOSCOPY WITH ENDOBRONCHIAL ULTRASOUND, left lung;  Surgeon: Lajuana Matte, MD;  Location: [facility];  Service: Thoracic;   Laterality: Left;  . WISDOM TOOTH EXTRACTION      REVIEW OF SYSTEMS:  A comprehensive review of systems was negative.   PHYSICAL EXAMINATION: General appearance: alert, cooperative and no distress Head: Normocephalic, without obvious abnormality, atraumatic Neck: no adenopathy, no JVD, supple, symmetrical, trachea midline and thyroid not enlarged, symmetric, no tenderness/mass/nodules Lymph nodes: Cervical, supraclavicular, and axillary nodes normal. Resp: clear to auscultation bilaterally Back: symmetric, no curvature. ROM normal. No CVA tenderness. Cardio: regular rate and rhythm, S1, S2 normal, no murmur, click, rub or gallop GI: soft, non-tender; bowel sounds  normal; no masses,  no organomegaly Extremities: extremities normal, atraumatic, no cyanosis or edema  ECOG PERFORMANCE STATUS: 1 - Symptomatic but completely ambulatory  Blood pressure (!) 141/66, pulse 70, temperature (!) 97.1 F (36.2 C), temperature source Tympanic, resp. rate 20, height $RemoveBe'5\' 1"'reryFguDg$  (1.549 m), weight 181 lb 4.8 oz (82.2 kg), SpO2 100 %.  LABORATORY DATA: Lab Results  Component Value Date   WBC 8.7 06/04/2020   HGB 10.9 (L) 06/04/2020   HCT 36.2 06/04/2020   MCV 79.9 (L) 06/04/2020   PLT 262 06/04/2020      Chemistry      Component Value Date/Time   NA 141 05/14/2020 0830   K 3.9 05/14/2020 0830   CL 105 05/14/2020 0830   CO2 23 05/14/2020 0830   BUN 15 05/14/2020 0830   CREATININE 1.71 (H) 05/14/2020 0830      Component Value Date/Time   CALCIUM 9.1 05/14/2020 0830   ALKPHOS 72 05/14/2020 0830   AST 20 05/14/2020 0830   ALT 13 05/14/2020 0830   BILITOT 0.4 05/14/2020 0830       RADIOGRAPHIC STUDIES: No results found.  ASSESSMENT AND PLAN: This is a very pleasant 65 years old African-American female with likely stage IV (T1c, N1, M1C) non-small cell lung cancer, adenocarcinoma with K-ras G12C mutation diagnosed in July 2020 and presented with left upper lobe lung nodule in addition to right  upper lobe pulmonary nodule as well as left hilar adenopathy and metastatic lesion to the brain. Molecular studies showed no actionable mutation and PDL 1 expression was 1%. The patient underwent SBRT to the metastatic brain lesion under the care of Dr. Isidore Moos. The patient is currently undergoing systemic chemotherapy with carboplatin for AUC of 5, Alimta 500 mg/M2 and Keytruda 200 mg IV every 3 weeks is status post 29 cycles.  Starting from cycle #5 she is on maintenance treatment with Alimta and Keytruda every 3 weeks with only Keytruda on the last few cycles because of the renal insufficiency. The patient has been tolerating her treatment with single agent Keytruda fairly well with no concerning complaints. I recommended for her to proceed with cycle #30 today as planned. The patient will come back for follow-up visit in 3 weeks for evaluation before the next cycle of her treatment. She was advised to call immediately if she has any concerning symptoms in the interval. The patient voices understanding of current disease status and treatment options and is in agreement with the current care plan. All questions were answered. The patient knows to call the clinic with any problems, questions or concerns. We can certainly see the patient much sooner if necessary.  Disclaimer: This note was dictated with voice recognition software. Similar sounding words can inadvertently be transcribed and may not be corrected upon review.

## 2020-06-04 NOTE — Patient Instructions (Signed)
[facility] MEDICAL [facility]COL[facility]  Discharge Instructions: ?Thank you for choosing [facility] to provide your oncology and hematology care.  ? ?If you have a lab appointment with the Cancer Center, please go directly to the Cancer Center and check in at the registration area. ?  ?Wear comfortable clothing and clothing appropriate for easy access to any Portacath or PICC line.  ? ?We strive to give you quality time with your provider. You may need to reschedule your appointment if you arrive late (15 or more minutes).  Arriving late affects you and other patients whose appointments are after yours.  Also, if you miss three or more appointments without notifying the office, you may be dismissed from the clinic at the provider?s discretion.    ?  ?For prescription refill requests, have your pharmacy contact our office and allow 72 hours for refills to be completed.   ? ?Today you received the following chemotherapy and/or immunotherapy agents: Keytruda ?  ?To help prevent nausea and vomiting after your treatment, we encourage you to take your nausea medication as directed. ? ?BELOW ARE SYMPTOMS THAT SHOULD BE REPORTED IMMEDIATELY: ?*FEVER GREATER THAN 100.4 F (38 ?C) OR HIGHER ?*CHILLS OR SWEATING ?*NAUSEA AND VOMITING THAT IS NOT CONTROLLED WITH YOUR NAUSEA MEDICATION ?*UNUSUAL SHORTNESS OF BREATH ?*UNUSUAL BRUISING OR BLEEDING ?*URINARY PROBLEMS (pain or burning when urinating, or frequent urination) ?*BOWEL PROBLEMS (unusual diarrhea, constipation, pain near the anus) ?TENDERNESS IN MOUTH AND THROAT WITH OR WITHOUT PRESENCE OF ULCERS (sore throat, sores in mouth, or a toothache) ?UNUSUAL RASH, SWELLING OR PAIN  ?UNUSUAL VAGINAL DISCHARGE OR ITCHING  ? ?Items with * indicate a potential emergency and should be followed up as soon as possible or go to the Emergency Department if any problems should occur. ? ?Please show the CHEMOTHERAPY ALERT CARD or IMMUNOTHERAPY ALERT CARD at check-in to the  Emergency Department and triage nurse. ? ?Should you have questions after your visit or need to cancel or reschedule your appointment, please contact [facility]  Dept: [phone]  and follow the prompts.  Office hours are 8:00 a.m. to 4:30 p.m. Monday - Friday. Please note that voicemails left after 4:00 p.m. may not be returned until the following business day.  We are closed weekends and major holidays. You have access to a nurse at all times for urgent questions. Please call the main number to the clinic Dept: [phone] and follow the prompts. ? ? ?For any non-urgent questions, you may also contact your provider using MyChart. We now offer e-Visits for anyone 18 and older to request care online for non-urgent symptoms. For details visit [email or web address]. ?  ?Also download the MyChart app! Go to the app store, search "MyChart", open the app, select [facility], and log in with your MyChart username and password. ? ?Due to Covid, a mask is required upon entering the hospital/clinic. If you do not have a mask, one will be given to you upon arrival. For doctor visits, patients may have 1 support person aged 18 or older with them. For treatment visits, patients cannot have anyone with them due to current Covid guidelines and our immunocompromised population.  ? ?

## 2020-06-05 ENCOUNTER — Other Ambulatory Visit: Payer: Self-pay

## 2020-06-05 ENCOUNTER — Encounter (HOSPITAL_COMMUNITY): Payer: Self-pay

## 2020-06-05 ENCOUNTER — Ambulatory Visit (HOSPITAL_COMMUNITY)
Admission: EM | Admit: 2020-06-05 | Discharge: 2020-06-05 | Disposition: A | Discharge status: Home / Self Care | Payer: Medicare Other

## 2020-06-05 DIAGNOSIS — J069 Acute upper respiratory infection, unspecified: Secondary | ICD-10-CM

## 2020-06-05 NOTE — ED Provider Notes (Signed)
[facility]    CSN: [id number] Arrival date & time: 06/05/20  [id number]      History   Chief Complaint Chief Complaint  Patient presents with  . Nasal Congestion  . Cough  . Sore Throat    HPI Carolyn [name] is a 65 y.o. female.   Patient presents with congestion, productive cough and sore throat beginning 5 days ago.  Throat is dry and scratchy but able to tolerate foods and liquids. Denies fever, chills, headaches, ear pain, shortness of breath, chest pain. Taking coricidin with relief.  Past Medical History:  Diagnosis Date  . Anginal pain ([place])    " with exertion "  . Arthritis    " 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 "  . Asthma    h/o  . Coronary artery disease   . Diabetes mellitus without complication ([facility])    Type II  . GERD (gastroesophageal reflux disease)   . H/O hiatal hernia   . Heart murmur   . History of radiation therapy 09/22/2018   stereotactic radiosurgery   . 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 Hypertension   . nscl ca dx'd 08/07/18  . Persistent headaches    h/o  . Pneumonia    hx of PNA  . Shortness of breath   . Vaginal dryness    h/o    Patient Active Problem List   Diagnosis Date Noted  . Elevated serum creatinine 04/12/2019  . Pyelonephritis 02/08/2019  . Port-A-Cath in place 10/19/2018  . Encounter for antineoplastic chemotherapy 09/28/2018  . Encounter for antineoplastic immunotherapy 09/28/2018  . Goals of care, counseling/discussion 09/28/2018  . Adenocarcinoma, lung ([place]) 09/12/2018  . Brain metastases ([place]) 08/30/2018  . Mass of upper lobe of left lung 08/17/2018  . Angina pectoris ([place]) 06/14/2012  . CAD (coronary artery disease), native coronary artery 06/14/2012  . S/P PTCA (percutaneous transluminal coronary angioplasty) 06/14/2012  . Hyperlipidemia 06/14/2012  . Essential hypertension 06/14/2012  . Pre-diabetes 06/14/2012    Past Surgical History:  Procedure Laterality Date  . ABDOMINAL HYSTERECTOMY    . bone spur    . CARDIAC  CATHETERIZATION  06/13/2012  . carpel tunnel surgery Left   . COLONOSCOPY     9 years ago  . CORONARY ANGIOPLASTY  06/13/2012  . EYE SURGERY     cyst left eye   . IR IMAGING GUIDED PORT INSERTION  10/10/2018  . LEFT HEART CATHETERIZATION WITH CORONARY ANGIOGRAM N/A 06/13/2012   Procedure: LEFT HEART CATHETERIZATION WITH CORONARY ANGIOGRAM;  Surgeon: Laverda Page, MD;  Location: [facility] CATH LAB;  Service: Cardiovascular;  Laterality: N/A;  . NECK SURGERY    . rotator cuff surgery Right 2015  . VIDEO BRONCHOSCOPY WITH ENDOBRONCHIAL NAVIGATION N/A 09/06/2018   Procedure: VIDEO BRONCHOSCOPY WITH ENDOBRONCHIAL NAVIGATION, left upper lung;  Surgeon: Lajuana Matte, MD;  Location: [facility];  Service: Thoracic;  Laterality: N/A;  . VIDEO BRONCHOSCOPY WITH ENDOBRONCHIAL ULTRASOUND Left 09/06/2018   Procedure: VIDEO BRONCHOSCOPY WITH ENDOBRONCHIAL ULTRASOUND, left lung;  Surgeon: Lajuana Matte, MD;  Location: [facility];  Service: Thoracic;  Laterality: Left;  . WISDOM TOOTH EXTRACTION      OB History    Gravida  3   Para      Term      Preterm      AB      Living  3     SAB      IAB      Ectopic  Multiple      Live Births               Home Medications    Prior to Admission medications   Medication Sig Start Date End Date Taking? Authorizing Provider  acetaminophen (TYLENOL) 500 MG tablet Take 1,000 mg by mouth every 8 (eight) hours as needed for mild pain, fever or headache.     [provider]  amLODipine (NORVASC) 10 MG tablet Take 1 tablet (10 mg total) by mouth daily. 03/28/20 03/23/21  Adrian Prows, MD  aspirin EC 81 MG tablet Take 81 mg by mouth daily.    [provider]  azelastine (ASTELIN) 0.1 % nasal spray 1 spray ea nostril 04/25/20   [provider]  carvedilol (COREG) 25 MG tablet Take 1/2 (one-half) tablet by mouth twice daily 04/21/20   Adrian Prows, MD  Cholecalciferol (VITAMIN D-3) 125 MCG (5000 UT) TABS Take 5,000 Units by mouth  daily.    [provider]  Chromium Picolinate 1000 MCG TABS Take 1,000 mg by mouth daily.    [provider]  esomeprazole (NEXIUM) 20 MG capsule Take 1 capsule by mouth daily.    [provider]  ezetimibe (ZETIA) 10 MG tablet Take 1 tablet (10 mg total) by mouth daily after supper. 03/28/20 06/26/20  Adrian Prows, MD  fexofenadine (ALLEGRA) 180 MG tablet 1 tablet swallow whole with water; do not take with fruit juices. 02/20/20   [provider]  folic acid (FOLVITE) 1 MG tablet folic acid 1 mg tablet    [provider]  Lancets (ONETOUCH DELICA PLUS [id number]) [place] SMARTSIG:1 Topical 3 Times Daily 08/01/19   [provider]  lidocaine-prilocaine (EMLA) cream Apply to the Port-A-Cath site 30 to 60 minutes before chemotherapy. 09/27/19   Heilingoetter, Cassandra L, PA-C  metFORMIN (GLUCOPHAGE) 500 MG tablet Take 500 mg by mouth 2 (two) times daily with a meal.     [provider]  methocarbamol (ROBAXIN) 500 MG tablet Take 1 tablet (500 mg total) by mouth every 6 (six) hours as needed for muscle spasms. 03/25/17   Lily Kocher, PA-C  montelukast (SINGULAIR) 10 MG tablet 1 tablet 02/20/20   [provider]  nitroGLYCERIN (NITROSTAT) 0.4 MG SL tablet Place 1 tablet (0.4 mg total) under the tongue every 5 (five) minutes as needed for chest pain. 03/09/19   Adrian Prows, MD  omega-3 acid ethyl esters (LOVAZA) 1 g capsule Lovaza 1 gram capsule  Take 2 capsules twice a day by oral route.    [provider]  [facility] ULTRA test strip USE 1 STRIP TO [place] DAILY 08/10/18   [provider]  Pitavastatin Calcium (LIVALO) 4 MG TABS Take 4 mg by mouth at bedtime.     [provider]  Polyethyl Glycol-Propyl Glycol 0.4-0.3 % SOLN Place 1 drop into both eyes every 6 (six) hours as needed (dry eyes).     [provider]  telmisartan (MICARDIS) 40 MG tablet Take 1 tablet by mouth daily.    [provider]    Family History Family History  Problem Relation Age of Onset  . Breast cancer Other   . Diabetes Mother   . Glaucoma Mother   . Hypertension Mother   . Hypertension Father   . Asthma Father   . Diabetes Sister   . Diabetes Brother   . Hypertension Sister     Social History Social History   Tobacco Use  . Smoking  status: Former Smoker    Packs/day: 0.50    Years: 30.00    Pack years: 15.00    Types: Cigarettes    Quit date: 02/09/1999    Years since quitting: 21.3  . Smokeless tobacco: Never Used  Vaping Use  . Vaping Use: Never used  Substance Use Topics  . Alcohol use: Not Currently  . Drug use: Not Currently     Allergies   Lisinopril   Review of Systems Review of Systems  Constitutional: Negative.   HENT: Positive for congestion, rhinorrhea and sore throat. Negative for dental problem, drooling, ear discharge, ear pain, facial swelling, hearing loss, mouth sores, nosebleeds, postnasal drip, sinus pressure, sinus pain, sneezing, tinnitus, trouble swallowing and voice change.   Respiratory: Negative.   Cardiovascular: Negative.   Gastrointestinal: Negative.   Skin: Negative.   Neurological: Negative.      Physical Exam Triage Vital Signs ED Triage Vitals  Enc Vitals Group     BP 06/05/20 1022 (!) 153/64     Pulse Rate 06/05/20 1022 (!) 58     Resp 06/05/20 1022 19     Temp 06/05/20 1022 98.2 F (36.8 C)     Temp Source 06/05/20 1022 Oral     SpO2 06/05/20 1022 100 %     Weight --      Height --      Head Circumference --      Peak Flow --      Pain Score 06/05/20 1020 0     Pain Loc --      Pain Edu? --      Excl. in [place]? --    No data found.  Updated Vital Signs BP 139/68   Pulse (!) 58   Temp 98.2 F (36.8 C) (Oral)   Resp 19   SpO2 100%   Visual Acuity Right Eye Distance:   Left Eye Distance:   Bilateral Distance:    Right Eye Near:   Left Eye Near:    Bilateral Near:     Physical Exam Constitutional:       Appearance: She is well-developed and normal weight.  HENT:     Head: Normocephalic.     Right Ear: Tympanic membrane and ear canal normal.     Left Ear: Tympanic membrane and ear canal normal.     Nose: Congestion and rhinorrhea present.     Mouth/Throat:     Mouth: Mucous membranes are moist.     Pharynx: Posterior oropharyngeal erythema present.  Eyes:     Extraocular Movements: Extraocular movements intact.  Cardiovascular:     Rate and Rhythm: Normal rate and regular rhythm.  Pulmonary:     Effort: Pulmonary effort is normal.     Breath sounds: Normal breath sounds.  Abdominal:     General: Bowel sounds are normal.     Palpations: Abdomen is soft.  Musculoskeletal:     Cervical back: Normal range of motion.  Lymphadenopathy:     Cervical: Cervical adenopathy present.  Skin:    General: Skin is warm and dry.  Neurological:     Mental Status: She is alert and oriented to person, place, and time. Mental status is at baseline.  Psychiatric:        Mood and Affect: Mood normal.        Behavior: Behavior normal.        Thought Content: Thought content normal.        Judgment: Judgment normal.  UC Treatments / Results  Labs (all labs ordered are listed, but only abnormal results are displayed) Labs Reviewed - No data to display  EKG   Radiology No results found.  Procedures Procedures (including critical care time)  Medications Ordered in UC Medications - No data to display  Initial Impression / Assessment and Plan / UC Course  I have reviewed the triage vital signs and the nursing notes.  Pertinent labs & imaging results that were available during my care of the patient were reviewed by me and considered in my medical decision making (see chart for details).    Viral URI with cough  1. Continue use of otc medications for symptom management  Final Clinical Impressions(s) / UC Diagnoses   Final diagnoses:  Viral URI with cough     Discharge  Instructions     Can continue use of over the counter medication for symptom management    ED Prescriptions    None     PDMP not reviewed this encounter.   Hans Eden, NP 06/05/20 1306

## 2020-06-05 NOTE — ED Triage Notes (Signed)
Pt c/o nasal congestion and cough x 5 days. She states her throat started hurting and c/o on Monday the sxs become worse.

## 2020-06-05 NOTE — Discharge Instructions (Addendum)
Can continue use of over the counter medication for symptom management

## 2020-06-23 NOTE — Progress Notes (Signed)
[facility] OFFICE PROGRESS NOTE  [name], [place] [address]  DIAGNOSIS: Stage IV (T1c, N1, M1c ) non-small cell lung cancer, adenocarcinoma diagnosed in July 2020 and presented with left upper lobe lung nodule in addition to right upper lobe lung nodule with left hilar lymphadenopathy as well as multiple brain metastasis.  Biomarker Findings Microsatellite status - MS-Stable Tumor Mutational Burden - 4 Muts/Mb Genomic Findings For a complete list of the genes assayed, please refer to the Appendix. ATM G204* KRAS G12C PDGFRA P122T SMO R199W 7 Disease relevant genes with no reportable alterations: ALK, BRAF, EGFR, ERBB2, MET, RET, ROS1  PDL 1 expression was 1%  PRIOR THERAPY: SBRT to multiple brain metastasis under the care of Dr. Isidore Moos.Completed in August 2020  CURRENT THERAPY: Systemic chemotherapy with carboplatin for AUC of 5, Alimta 500 mg/M2 and Keytruda 200 mg IV every 3 weeks.  First dose October 05, 2018.  Status post 30 cycles.  Starting from cycle #5 the patient will be on treatment with maintenance Alimta 500 mg/M2 and Keytruda 200 mg IV every 3 weeks.  She has been on treatment with single agent Keytruda secondary to renal insufficiency  INTERVAL HISTORY: Carolyn [name] 65 y.o. female returns to the clinic today for follow-up visit.  The patient is feeling fine today with no concerning complaints. She recently went to the ER on 06/05/20 and was diagnosed with a viral URI. Her symptoms lasted 8 days. She is feeling better at this time. Her chemotherapy with alimta was discontinued due to renal insuffiencey for which she is followed by nephrology. She tolerated her last cycle of treatment with single agent immunotherapy with Keytruda well without any concerning adverse side effects. The patient denies any recent fever, chills, or weight loss. She reports baseline night sweats.She denies any chest pain,  shortness of breath, cough, or hemoptysis. She denies any nausea, vomiting, diarrhea, or constipation. She denies any rashes or skin changes. She denies any visual changes.She has baseline occasional headaches that she describes as a "ping" that lasts a few seconds before resolving. She has had these headaches since before she was diagnosed but she thinks this may have slightly increased in the last 2-3 weeks.Her next brain MRI is scheduled for July 2022. She follows with neuro-oncology. She is here today for evaluation before starting cycle #31   MEDICAL HISTORY: Past Medical History:  Diagnosis Date  . Anginal pain ([place])    " with exertion "  . Arthritis    " 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 "  . Asthma    h/o  . Coronary artery disease   . Diabetes mellitus without complication ([place])    Type II  . GERD (gastroesophageal reflux disease)   . H/O hiatal hernia   . Heart murmur   . History of radiation therapy 09/22/2018   stereotactic radiosurgery   . 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 Hypertension   . nscl ca dx'd 08/07/18  . Persistent headaches    h/o  . Pneumonia    hx of PNA  . Shortness of breath   . Vaginal dryness    h/o    ALLERGIES:  is allergic to lisinopril.  MEDICATIONS:  Current Outpatient Medications  Medication Sig Dispense Refill  . acetaminophen (TYLENOL) 500 MG tablet Take 1,000 mg by mouth every 8 (eight) hours as needed for mild pain, fever or headache.     Marland Kitchen amLODipine (NORVASC) 10 MG tablet  Take 1 tablet (10 mg total) by mouth daily. 90 tablet 3  . aspirin EC 81 MG tablet Take 81 mg by mouth daily.    Marland Kitchen azelastine (ASTELIN) 0.1 % nasal spray 1 spray ea nostril    . carvedilol (COREG) 25 MG tablet Take 1/2 (one-half) tablet by mouth twice daily 180 tablet 0  . Cholecalciferol (VITAMIN D-3) 125 MCG (5000 UT) TABS Take 5,000 Units by mouth daily.    . Chromium Picolinate 1000 MCG TABS Take 1,000 mg by mouth daily.    Marland Kitchen esomeprazole (NEXIUM) 20 MG capsule Take 1 capsule by mouth  daily.    Marland Kitchen ezetimibe (ZETIA) 10 MG tablet Take 1 tablet (10 mg total) by mouth daily after supper. 30 tablet 2  . fexofenadine (ALLEGRA) 180 MG tablet 1 tablet swallow whole with water; do not take with fruit juices.    . folic acid (FOLVITE) 1 MG tablet folic acid 1 mg tablet    . Lancets (ONETOUCH DELICA PLUS [id number]) MISC SMARTSIG:1 Topical 3 Times Daily    . lidocaine-prilocaine (EMLA) cream Apply to the Port-A-Cath site 30 to 60 minutes before chemotherapy. 30 g 2  . metFORMIN (GLUCOPHAGE) 500 MG tablet Take 500 mg by mouth 2 (two) times daily with a meal.     . methocarbamol (ROBAXIN) 500 MG tablet Take 1 tablet (500 mg total) by mouth every 6 (six) hours as needed for muscle spasms. 21 tablet 0  . montelukast (SINGULAIR) 10 MG tablet 1 tablet    . nitroGLYCERIN (NITROSTAT) 0.4 MG SL tablet Place 1 tablet (0.4 mg total) under the tongue every 5 (five) minutes as needed for chest pain. 25 tablet 2  . omega-3 acid ethyl esters (LOVAZA) 1 g capsule Lovaza 1 gram capsule  Take 2 capsules twice a day by oral route.    Glory Rosebush ULTRA test strip USE 1 STRIP TO CHECK GLUCOSE THREE TIMES DAILY    . Pitavastatin Calcium (LIVALO) 4 MG TABS Take 4 mg by mouth at bedtime.     Vladimir Faster Glycol-Propyl Glycol 0.4-0.3 % SOLN Place 1 drop into both eyes every 6 (six) hours as needed (dry eyes).     Marland Kitchen telmisartan (MICARDIS) 40 MG tablet Take 1 tablet by mouth daily.     No current facility-administered medications for this visit.   Facility-Administered Medications Ordered in Other Visits  Medication Dose Route Frequency Provider Last Rate Last Admin  . 0.9 %  sodium chloride infusion   Intravenous Once Curt Bears, MD      . 0.9 %  sodium chloride infusion   Intravenous Once Curt Bears, MD      . heparin lock flush 100 unit/mL  500 Units Intracatheter Once PRN Curt Bears, MD      . pembrolizumab [facility]) 200 mg in sodium chloride 0.9 % 50 mL chemo infusion  200 mg Intravenous  Once Curt Bears, MD      . sodium chloride flush (NS) 0.9 % injection 10 mL  10 mL Intracatheter PRN Curt Bears, MD        SURGICAL HISTORY:  Past Surgical History:  Procedure Laterality Date  . ABDOMINAL HYSTERECTOMY    . bone spur    . CARDIAC CATHETERIZATION  06/13/2012  . carpel tunnel surgery Left   . COLONOSCOPY     9 years ago  . CORONARY ANGIOPLASTY  06/13/2012  . EYE SURGERY     cyst left eye   . IR IMAGING GUIDED PORT INSERTION  10/10/2018  .  LEFT HEART CATHETERIZATION WITH CORONARY ANGIOGRAM N/A 06/13/2012   Procedure: LEFT HEART CATHETERIZATION WITH CORONARY ANGIOGRAM;  Surgeon: Laverda Page, MD;  Location: [facility] CATH LAB;  Service: Cardiovascular;  Laterality: N/A;  . NECK SURGERY    . rotator cuff surgery Right 2015  . VIDEO BRONCHOSCOPY WITH ENDOBRONCHIAL NAVIGATION N/A 09/06/2018   Procedure: VIDEO BRONCHOSCOPY WITH ENDOBRONCHIAL NAVIGATION, left upper lung;  Surgeon: Lajuana Matte, MD;  Location: [facility];  Service: Thoracic;  Laterality: N/A;  . VIDEO BRONCHOSCOPY WITH ENDOBRONCHIAL ULTRASOUND Left 09/06/2018   Procedure: VIDEO BRONCHOSCOPY WITH ENDOBRONCHIAL ULTRASOUND, left lung;  Surgeon: Lajuana Matte, MD;  Location: [facility];  Service: Thoracic;  Laterality: Left;  . WISDOM TOOTH EXTRACTION      REVIEW OF SYSTEMS:   Review of Systems  Constitutional: Negative for appetite change, chills, fatigue, fever and unexpected weight change.  HENT:Negative for mouth sores, nosebleeds, sore throat and trouble swallowing.  Eyes: Negative for eye problems and icterus.  Respiratory: Negative for cough, hemoptysis, shortness of breath and wheezing.  Cardiovascular: Negative for chest pain and leg swelling.  Gastrointestinal: Negative for abdominal pain, constipation, diarrhea, nausea and vomiting.  Genitourinary: Negative for bladder incontinence, difficulty urinating, dysuria, frequency and hematuria.  Musculoskeletal:Negative for back pain, gait  problem, neck pain and neck stiffness.  Skin: Negative for itching and rash.  Neurological:Positive for occasional headaches.Negative for dizziness, extremity weakness, gait problem, headaches, light-headedness and seizures.  Hematological: Negative for adenopathy. Does not bruise/bleed easily.  Psychiatric/Behavioral: Negative for confusion, depression and sleep disturbance. The patient is not nervous/anxious.   PHYSICAL EXAMINATION:  Blood pressure (!) 127/56, pulse 63, temperature (!) 97.5 F (36.4 C), temperature source Tympanic, resp. rate 20, height _0  (1.549 m), weight 180 lb 3.2 oz (81.7 kg), SpO2 100 %.  ECOG PERFORMANCE STATUS: 1 - Symptomatic but completely ambulatory  Physical Exam  Constitutional: Oriented to person, place, and time and well-developed, well-nourished, and in no distress.   HENT:  Head: Normocephalic and atraumatic.  Mouth/Throat: Oropharynx is clear and moist. No oropharyngeal exudate.  Eyes: Conjunctivae are normal. Right eye exhibits no discharge. Left eye exhibits no discharge. No scleral icterus.  Neck: Normal range of motion. Neck supple.  Cardiovascular: Normal rate, regular rhythm, normal heart sounds and intact distal pulses.   Pulmonary/Chest: Effort normal and breath sounds normal. No respiratory distress. No wheezes. No rales.  Abdominal: Soft. Bowel sounds are normal. Exhibits no distension and no mass. There is no tenderness.  Musculoskeletal: Normal range of motion. Exhibits no edema.  Lymphadenopathy:    No cervical adenopathy.  Neurological: Alert and oriented to person, place, and time. Exhibits normal muscle tone. Gait normal. Coordination normal.  Skin: Skin is warm and dry. No rash noted. Not diaphoretic. No erythema. No pallor.  Psychiatric: Mood, memory and judgment normal.  Vitals reviewed.  LABORATORY DATA: Lab Results  Component Value Date   WBC 6.7 06/26/2020   HGB 10.7 (L) 06/26/2020   HCT 34.4 (L) 06/26/2020   MCV  78.4 (L) 06/26/2020   PLT 265 06/26/2020      Chemistry      Component Value Date/Time   NA 142 06/26/2020 0819   K 3.8 06/26/2020 0819   CL 105 06/26/2020 0819   CO2 27 06/26/2020 0819   BUN 14 06/26/2020 0819   CREATININE 1.64 (H) 06/26/2020 0819      Component Value Date/Time   CALCIUM 9.4 06/26/2020 0819   ALKPHOS 76 06/26/2020 0819   AST 21 06/26/2020  0819   ALT 10 06/26/2020 0819   BILITOT 0.3 06/26/2020 0819       RADIOGRAPHIC STUDIES:  No results found.   ASSESSMENT/PLAN:  This is a very pleasant 65 year old African-American female with stage IV (T1c, N1,M1c)non-small celllung cancer, adenocarcinoma. She presented with a dominant left upper lobe lung nodule in addition to a right upper lobe pulmonary nodule with left hilar lymphadenopathy. She also has metastatic disease to the brain.She has no actionable mutations and her PDL1 expression is 1%. She was diagnosed in July 2020.She also has KRASG12C which is a targetable mutation that can be used in the second line setting.  The patient completed SBRT to the metastatic brain lesion under the care of Dr. Isidore Moos in August 2020  The patient is currently undergoing systemic chemotherapy withcarboplatin for an AUC of 5, Alimta 500 mg/m, and Keytruda 200 mg IV every 3 weeks. She is status post30cyclesof treatment. Starting from cycle #5, she has been on maintenance Alimta 500 mg/m2 and Keytruda 200 mg IV.She tolerated her treatment well without anyconcerningadverse side effects.Alimta wasdiscontinued due to renal insufficiency.  Labs were reviewed. Recommend that she proceed with cycle #31 today as scheduled.   We will see her back for a follow up visit in 3 weeks for evaluation before starting cycle #32.   I will arrange for a restaging scan before starting her next cycle of treatment. I will arrange for this to be performed without contrast due to her renal insufficiency.   She mentions that she always  has headaches this entire time that feel like a "ping" before resolving spontaneously. She just thinks they may have been occurring a little more often over the last 2-3 weeks. I advised she monitor but if this continues to increase, last longer durations, or develop new or worsening symptoms she was advised to call. She denies visual changes, balance changes, or visual changes.   The patient was advised to call immediately if she has any concerning symptoms in the interval. The patient voices understanding of current disease status and treatment options and is in agreement with the current care plan. All questions were answered. The patient knows to call the clinic with any problems, questions or concerns. We can certainly see the patient much sooner if necessary    Orders Placed This Encounter  Procedures  . CT Chest Wo Contrast    Standing Status:   Future    Standing Expiration Date:   06/26/2021    Order Specific Question:   Preferred imaging location?    Answer:   [facility]  . CT Abdomen Pelvis Wo Contrast    Standing Status:   Future    Standing Expiration Date:   06/26/2021    Order Specific Question:   Preferred imaging location?    Answer:   [facility]    Order Specific Question:   Is Oral Contrast requested for this exam?    Answer:   Yes, Per Radiology protocol     I spent 20-29 minutes in this encounter.   Aydin Hink L Shawnmichael Parenteau, PA-C 06/26/20

## 2020-06-25 ENCOUNTER — Ambulatory Visit: Admitting: Cardiology

## 2020-06-26 ENCOUNTER — Inpatient Hospital Stay: Payer: Medicare Other

## 2020-06-26 ENCOUNTER — Inpatient Hospital Stay: Payer: Medicare Other | Attending: Physician Assistant | Admitting: Physician Assistant

## 2020-06-26 ENCOUNTER — Other Ambulatory Visit: Payer: Medicare Other

## 2020-06-26 ENCOUNTER — Other Ambulatory Visit: Payer: Self-pay

## 2020-06-26 VITALS — BP 127/56 | HR 63 | Temp 97.5°F | Resp 20 | Ht 61.0 in | Wt 180.2 lb

## 2020-06-26 DIAGNOSIS — C3491 Malignant neoplasm of unspecified part of right bronchus or lung: Secondary | ICD-10-CM

## 2020-06-26 DIAGNOSIS — C3492 Malignant neoplasm of unspecified part of left bronchus or lung: Secondary | ICD-10-CM

## 2020-06-26 DIAGNOSIS — Z95828 Presence of other vascular implants and grafts: Secondary | ICD-10-CM

## 2020-06-26 DIAGNOSIS — R5383 Other fatigue: Secondary | ICD-10-CM

## 2020-06-26 DIAGNOSIS — C7931 Secondary malignant neoplasm of brain: Secondary | ICD-10-CM | POA: Diagnosis not present

## 2020-06-26 DIAGNOSIS — Z5112 Encounter for antineoplastic immunotherapy: Secondary | ICD-10-CM | POA: Insufficient documentation

## 2020-06-26 DIAGNOSIS — C3412 Malignant neoplasm of upper lobe, left bronchus or lung: Secondary | ICD-10-CM | POA: Insufficient documentation

## 2020-06-26 DIAGNOSIS — Z79899 Other long term (current) drug therapy: Secondary | ICD-10-CM | POA: Insufficient documentation

## 2020-06-26 LAB — CMP (CANCER CENTER ONLY)
ALT: 10 U/L (ref 0–44)
AST: 21 U/L (ref 15–41)
Albumin: 3.4 g/dL — ABNORMAL LOW (ref 3.5–5.0)
Alkaline Phosphatase: 76 U/L (ref 38–126)
Anion gap: 10 (ref 5–15)
BUN: 14 mg/dL (ref 8–23)
CO2: 27 mmol/L (ref 22–32)
Calcium: 9.4 mg/dL (ref 8.9–10.3)
Chloride: 105 mmol/L (ref 98–111)
Creatinine: 1.64 mg/dL — ABNORMAL HIGH (ref 0.44–1.00)
GFR, Estimated: 34 mL/min — ABNORMAL LOW (ref >60)
Glucose, Bld: 116 mg/dL — ABNORMAL HIGH (ref 70–99)
Potassium: 3.8 mmol/L (ref 3.5–5.1)
Sodium: 142 mmol/L (ref 135–145)
Total Bilirubin: 0.3 mg/dL (ref 0.3–1.2)
Total Protein: 7.1 g/dL (ref 6.5–8.1)

## 2020-06-26 LAB — CBC WITH DIFFERENTIAL (CANCER CENTER ONLY)
Abs Immature Granulocytes: 0.01 10*3/uL (ref 0.00–0.07)
Basophils Absolute: 0 10*3/uL (ref 0.0–0.1)
Basophils Relative: 0 %
Eosinophils Absolute: 0.6 10*3/uL — ABNORMAL HIGH (ref 0.0–0.5)
Eosinophils Relative: 8 %
HCT: 34.4 % — ABNORMAL LOW (ref 36.0–46.0)
Hemoglobin: 10.7 g/dL — ABNORMAL LOW (ref 12.0–15.0)
Immature Granulocytes: 0 %
Lymphocytes Relative: 21 %
Lymphs Abs: 1.4 10*3/uL (ref 0.7–4.0)
MCH: 24.4 pg — ABNORMAL LOW (ref 26.0–34.0)
MCHC: 31.1 g/dL (ref 30.0–36.0)
MCV: 78.4 fL — ABNORMAL LOW (ref 80.0–100.0)
Monocytes Absolute: 0.6 10*3/uL (ref 0.1–1.0)
Monocytes Relative: 8 %
Neutro Abs: 4.1 10*3/uL (ref 1.7–7.7)
Neutrophils Relative %: 63 %
Platelet Count: 265 10*3/uL (ref 150–400)
RBC: 4.39 MIL/uL (ref 3.87–5.11)
RDW: 16 % — ABNORMAL HIGH (ref 11.5–15.5)
WBC Count: 6.7 10*3/uL (ref 4.0–10.5)
nRBC: 0 % (ref 0.0–0.2)

## 2020-06-26 LAB — TSH: TSH: 2.632 u[IU]/mL (ref 0.308–3.960)

## 2020-06-26 MED ORDER — SODIUM CHLORIDE 0.9% FLUSH
10.0000 mL | INTRAVENOUS | Status: DC | PRN
  Administered 2020-06-26: 10 mL
  Filled 2020-06-26: qty 10

## 2020-06-26 MED ORDER — SODIUM CHLORIDE 0.9 % IV SOLN
Freq: Once | INTRAVENOUS | Status: AC
  Filled 2020-06-26: qty 250

## 2020-06-26 MED ORDER — SODIUM CHLORIDE 0.9 % IV SOLN
200.0000 mg | Freq: Once | INTRAVENOUS | Status: AC
  Administered 2020-06-26: 200 mg via INTRAVENOUS
  Filled 2020-06-26: qty 8

## 2020-06-26 MED ORDER — HEPARIN SOD (PORK) LOCK FLUSH 100 UNIT/ML IV SOLN
500.0000 [IU] | Freq: Once | INTRAVENOUS | Status: AC | PRN
  Administered 2020-06-26: 500 [IU]
  Filled 2020-06-26: qty 5

## 2020-06-26 MED ORDER — SODIUM CHLORIDE 0.9 % IV SOLN
Freq: Once | INTRAVENOUS | Status: DC
  Filled 2020-06-26: qty 250

## 2020-06-26 NOTE — Progress Notes (Signed)
Per Cassia PA, patient is OK to treat with creatinine 1.64

## 2020-06-26 NOTE — Patient Instructions (Signed)
[facility]COL[facility]  Discharge Instructions: ?Thank you for choosing [facility] to provide your oncology and hematology care.  ? ?If you have a lab appointment with the Cancer Center, please go directly to the Cancer Center and check in at the registration area. ?  ?Wear comfortable clothing and clothing appropriate for easy access to any Portacath or PICC line.  ? ?We strive to give you quality time with your provider. You may need to reschedule your appointment if you arrive late (15 or more minutes).  Arriving late affects you and other patients whose appointments are after yours.  Also, if you miss three or more appointments without notifying the office, you may be dismissed from the clinic at the provider?s discretion.    ?  ?For prescription refill requests, have your pharmacy contact our office and allow 72 hours for refills to be completed.   ? ?Today you received the following chemotherapy and/or immunotherapy agents: Keytruda ?  ?To help prevent nausea and vomiting after your treatment, we encourage you to take your nausea medication as directed. ? ?BELOW ARE SYMPTOMS THAT SHOULD BE REPORTED IMMEDIATELY: ?*FEVER GREATER THAN 100.4 F (38 ?C) OR HIGHER ?*CHILLS OR SWEATING ?*NAUSEA AND VOMITING THAT IS NOT CONTROLLED WITH YOUR NAUSEA MEDICATION ?*UNUSUAL SHORTNESS OF BREATH ?*UNUSUAL BRUISING OR BLEEDING ?*URINARY PROBLEMS (pain or burning when urinating, or frequent urination) ?*BOWEL PROBLEMS (unusual diarrhea, constipation, pain near the anus) ?TENDERNESS IN MOUTH AND THROAT WITH OR WITHOUT PRESENCE OF ULCERS (sore throat, sores in mouth, or a toothache) ?UNUSUAL RASH, SWELLING OR PAIN  ?UNUSUAL VAGINAL DISCHARGE OR ITCHING  ? ?Items with * indicate a potential emergency and should be followed up as soon as possible or go to the Emergency Department if any problems should occur. ? ?Please show the CHEMOTHERAPY ALERT CARD or IMMUNOTHERAPY ALERT CARD at check-in to the  Emergency Department and triage nurse. ? ?Should you have questions after your visit or need to cancel or reschedule your appointment, please contact [facility]  Dept: [phone]  and follow the prompts.  Office hours are 8:00 a.m. to 4:30 p.m. Monday - Friday. Please note that voicemails left after 4:00 p.m. may not be returned until the following business day.  We are closed weekends and major holidays. You have access to a nurse at all times for urgent questions. Please call the main number to the clinic Dept: [phone] and follow the prompts. ? ? ?For any non-urgent questions, you may also contact your provider using MyChart. We now offer e-Visits for anyone 18 and older to request care online for non-urgent symptoms. For details visit [email or web address]. ?  ?Also download the MyChart app! Go to the app store, search "MyChart", open the app, select [facility], and log in with your MyChart username and password. ? ?Due to Covid, a mask is required upon entering the hospital/clinic. If you do not have a mask, one will be given to you upon arrival. For doctor visits, patients may have 1 support person aged 18 or older with them. For treatment visits, patients cannot have anyone with them due to current Covid guidelines and our immunocompromised population.  ? ?

## 2020-06-26 NOTE — Patient Instructions (Signed)

## 2020-06-30 ENCOUNTER — Telehealth: Payer: Self-pay | Admitting: Physician Assistant

## 2020-06-30 ENCOUNTER — Other Ambulatory Visit: Payer: Self-pay | Admitting: Cardiology

## 2020-06-30 DIAGNOSIS — E7800 Pure hypercholesterolemia, unspecified: Secondary | ICD-10-CM

## 2020-06-30 NOTE — Telephone Encounter (Signed)
Scheduled per los. Called and left msg. Mailed printout  °

## 2020-07-14 ENCOUNTER — Ambulatory Visit (HOSPITAL_COMMUNITY): Admission: RE | Admit: 2020-07-14 | Payer: Medicare Other | Source: Ambulatory Visit

## 2020-07-14 ENCOUNTER — Other Ambulatory Visit: Payer: Self-pay | Admitting: *Deleted

## 2020-07-14 DIAGNOSIS — Z95828 Presence of other vascular implants and grafts: Secondary | ICD-10-CM

## 2020-07-15 ENCOUNTER — Ambulatory Visit (HOSPITAL_COMMUNITY)
Admission: RE | Admit: 2020-07-15 | Discharge: 2020-07-15 | Disposition: A | Discharge status: Home / Self Care | Payer: Medicare Other | Source: Ambulatory Visit | Attending: Physician Assistant | Admitting: Physician Assistant

## 2020-07-15 ENCOUNTER — Other Ambulatory Visit: Payer: Self-pay

## 2020-07-15 ENCOUNTER — Telehealth: Payer: Self-pay

## 2020-07-15 DIAGNOSIS — I2510 Atherosclerotic heart disease of native coronary artery without angina pectoris: Secondary | ICD-10-CM | POA: Diagnosis not present

## 2020-07-15 DIAGNOSIS — J984 Other disorders of lung: Secondary | ICD-10-CM | POA: Diagnosis not present

## 2020-07-15 DIAGNOSIS — C3491 Malignant neoplasm of unspecified part of right bronchus or lung: Secondary | ICD-10-CM | POA: Diagnosis not present

## 2020-07-15 DIAGNOSIS — I700 Atherosclerosis of aorta: Secondary | ICD-10-CM | POA: Diagnosis not present

## 2020-07-15 DIAGNOSIS — K449 Diaphragmatic hernia without obstruction or gangrene: Secondary | ICD-10-CM | POA: Diagnosis not present

## 2020-07-15 DIAGNOSIS — C3490 Malignant neoplasm of unspecified part of unspecified bronchus or lung: Secondary | ICD-10-CM | POA: Diagnosis not present

## 2020-07-15 NOTE — Telephone Encounter (Signed)
Pt LM stating she needs refills. Unfortunately, the pt did not indicate what she needs refilled and it is unclear by her medication list. I have LM for the pt requesting a return call with the name of medications she is wanting/needing refilled.

## 2020-07-16 ENCOUNTER — Other Ambulatory Visit: Payer: Medicare Other

## 2020-07-16 ENCOUNTER — Other Ambulatory Visit: Payer: Self-pay

## 2020-07-16 ENCOUNTER — Encounter: Payer: Self-pay | Admitting: Internal Medicine

## 2020-07-16 ENCOUNTER — Inpatient Hospital Stay: Payer: Medicare Other

## 2020-07-16 ENCOUNTER — Inpatient Hospital Stay: Payer: Medicare Other | Attending: Physician Assistant | Admitting: Internal Medicine

## 2020-07-16 VITALS — BP 151/65 | HR 64 | Temp 97.2°F | Resp 20 | Ht 61.0 in | Wt 180.7 lb

## 2020-07-16 DIAGNOSIS — C3412 Malignant neoplasm of upper lobe, left bronchus or lung: Secondary | ICD-10-CM | POA: Diagnosis not present

## 2020-07-16 DIAGNOSIS — Z79899 Other long term (current) drug therapy: Secondary | ICD-10-CM | POA: Diagnosis not present

## 2020-07-16 DIAGNOSIS — D649 Anemia, unspecified: Secondary | ICD-10-CM | POA: Diagnosis not present

## 2020-07-16 DIAGNOSIS — C7931 Secondary malignant neoplasm of brain: Secondary | ICD-10-CM | POA: Diagnosis not present

## 2020-07-16 DIAGNOSIS — C3492 Malignant neoplasm of unspecified part of left bronchus or lung: Secondary | ICD-10-CM

## 2020-07-16 DIAGNOSIS — Z95828 Presence of other vascular implants and grafts: Secondary | ICD-10-CM

## 2020-07-16 DIAGNOSIS — C3491 Malignant neoplasm of unspecified part of right bronchus or lung: Secondary | ICD-10-CM

## 2020-07-16 DIAGNOSIS — Z5112 Encounter for antineoplastic immunotherapy: Secondary | ICD-10-CM | POA: Diagnosis not present

## 2020-07-16 DIAGNOSIS — I10 Essential (primary) hypertension: Secondary | ICD-10-CM

## 2020-07-16 DIAGNOSIS — R5383 Other fatigue: Secondary | ICD-10-CM

## 2020-07-16 LAB — CMP (CANCER CENTER ONLY)
ALT: 13 U/L (ref 0–44)
AST: 20 U/L (ref 15–41)
Albumin: 3.4 g/dL — ABNORMAL LOW (ref 3.5–5.0)
Alkaline Phosphatase: 79 U/L (ref 38–126)
Anion gap: 12 (ref 5–15)
BUN: 14 mg/dL (ref 8–23)
CO2: 25 mmol/L (ref 22–32)
Calcium: 9.1 mg/dL (ref 8.9–10.3)
Chloride: 105 mmol/L (ref 98–111)
Creatinine: 1.65 mg/dL — ABNORMAL HIGH (ref 0.44–1.00)
GFR, Estimated: 34 mL/min — ABNORMAL LOW (ref >60)
Glucose, Bld: 150 mg/dL — ABNORMAL HIGH (ref 70–99)
Potassium: 3.9 mmol/L (ref 3.5–5.1)
Sodium: 142 mmol/L (ref 135–145)
Total Bilirubin: 0.3 mg/dL (ref 0.3–1.2)
Total Protein: 7.2 g/dL (ref 6.5–8.1)

## 2020-07-16 LAB — CBC WITH DIFFERENTIAL (CANCER CENTER ONLY)
Abs Immature Granulocytes: 0.02 10*3/uL (ref 0.00–0.07)
Basophils Absolute: 0 10*3/uL (ref 0.0–0.1)
Basophils Relative: 1 %
Eosinophils Absolute: 0.5 10*3/uL (ref 0.0–0.5)
Eosinophils Relative: 8 %
HCT: 34.7 % — ABNORMAL LOW (ref 36.0–46.0)
Hemoglobin: 10.5 g/dL — ABNORMAL LOW (ref 12.0–15.0)
Immature Granulocytes: 0 %
Lymphocytes Relative: 25 %
Lymphs Abs: 1.7 10*3/uL (ref 0.7–4.0)
MCH: 24.1 pg — ABNORMAL LOW (ref 26.0–34.0)
MCHC: 30.3 g/dL (ref 30.0–36.0)
MCV: 79.6 fL — ABNORMAL LOW (ref 80.0–100.0)
Monocytes Absolute: 0.6 10*3/uL (ref 0.1–1.0)
Monocytes Relative: 8 %
Neutro Abs: 4 10*3/uL (ref 1.7–7.7)
Neutrophils Relative %: 58 %
Platelet Count: 262 10*3/uL (ref 150–400)
RBC: 4.36 MIL/uL (ref 3.87–5.11)
RDW: 15.9 % — ABNORMAL HIGH (ref 11.5–15.5)
WBC Count: 6.8 10*3/uL (ref 4.0–10.5)
nRBC: 0 % (ref 0.0–0.2)

## 2020-07-16 LAB — TSH: TSH: 2.148 u[IU]/mL (ref 0.308–3.960)

## 2020-07-16 MED ORDER — ACETAMINOPHEN 325 MG PO TABS
ORAL_TABLET | ORAL | Status: AC
  Filled 2020-07-16: qty 2

## 2020-07-16 MED ORDER — SODIUM CHLORIDE 0.9 % IV SOLN
200.0000 mg | Freq: Once | INTRAVENOUS | Status: AC
  Administered 2020-07-16: 200 mg via INTRAVENOUS
  Filled 2020-07-16: qty 8

## 2020-07-16 MED ORDER — SODIUM CHLORIDE 0.9% FLUSH
10.0000 mL | INTRAVENOUS | Status: DC | PRN
  Administered 2020-07-16: 10 mL
  Filled 2020-07-16: qty 10

## 2020-07-16 MED ORDER — SODIUM CHLORIDE 0.9 % IV SOLN
Freq: Once | INTRAVENOUS | Status: AC
  Filled 2020-07-16: qty 250

## 2020-07-16 MED ORDER — ACETAMINOPHEN 325 MG PO TABS
650.0000 mg | ORAL_TABLET | Freq: Once | ORAL | Status: AC
  Administered 2020-07-16: 650 mg via ORAL

## 2020-07-16 MED ORDER — HEPARIN SOD (PORK) LOCK FLUSH 100 UNIT/ML IV SOLN
500.0000 [IU] | Freq: Once | INTRAVENOUS | Status: AC | PRN
Start: 2020-07-16 — End: 2020-07-16
  Administered 2020-07-16: 500 [IU]
  Filled 2020-07-16: qty 5

## 2020-07-16 NOTE — Patient Instructions (Signed)
[facility] ONCOLOGY  Discharge Instructions: Thank you for choosing [facility] to provide your oncology and hematology care.   If you have a lab appointment with the [facility], please go directly to the [facility] and check in at the registration area.   Wear comfortable clothing and clothing appropriate for easy access to any Portacath or PICC line.   We strive to give you quality time with your provider. You may need to reschedule your appointment if you arrive late (15 or more minutes).  Arriving late affects you and other patients whose appointments are after yours.  Also, if you miss three or more appointments without notifying the office, you may be dismissed from the clinic at the provider's discretion.      For prescription refill requests, have your pharmacy contact our office and allow 72 hours for refills to be completed.    Today you received the following chemotherapy and/or immunotherapy agents Beryle Flock   To help prevent nausea and vomiting after your treatment, we encourage you to take your nausea medication as directed.  BELOW ARE SYMPTOMS THAT SHOULD BE REPORTED IMMEDIATELY: . *FEVER GREATER THAN 100.4 F (38 C) OR HIGHER . *CHILLS OR SWEATING . *NAUSEA AND VOMITING THAT IS NOT CONTROLLED WITH YOUR NAUSEA MEDICATION . *UNUSUAL SHORTNESS OF BREATH . *UNUSUAL BRUISING OR BLEEDING . *URINARY PROBLEMS (pain or burning when urinating, or frequent urination) . *BOWEL PROBLEMS (unusual diarrhea, constipation, pain near the anus) . TENDERNESS IN MOUTH AND THROAT WITH OR WITHOUT PRESENCE OF ULCERS (sore throat, sores in mouth, or a toothache) . UNUSUAL RASH, SWELLING OR PAIN  . UNUSUAL VAGINAL DISCHARGE OR ITCHING   Items with * indicate a potential emergency and should be followed up as soon as possible or go to the Emergency Department if any problems should occur.  Please show the CHEMOTHERAPY ALERT CARD or IMMUNOTHERAPY ALERT CARD  at check-in to the Emergency Department and triage nurse.  Should you have questions after your visit or need to cancel or reschedule your appointment, please contact [facility]  Dept: [phone]  and follow the prompts.  Office hours are 8:00 a.m. to 4:30 p.m. Monday - Friday. Please note that voicemails left after 4:00 p.m. may not be returned until the following business day.  We are closed weekends and major holidays. You have access to a nurse at all times for urgent questions. Please call the main number to the clinic Dept: [phone] and follow the prompts.   For any non-urgent questions, you may also contact your provider using MyChart. We now offer e-Visits for anyone 22 and older to request care online for non-urgent symptoms. For details visit [email or web address].   Also download the MyChart app! Go to the app store, search "MyChart", open the app, select [facility], and log in with your MyChart username and password.  Due to Covid, a mask is required upon entering the hospital/clinic. If you do not have a mask, one will be given to you upon arrival. For doctor visits, patients may have 1 support person aged 67 or older with them. For treatment visits, patients cannot have anyone with them due to current Covid guidelines and our immunocompromised population.

## 2020-07-16 NOTE — Progress Notes (Signed)
Per Dr. Julien Nordmann ,it is okay to treat pt today with Keytruda and creatinine of 1.65.

## 2020-07-16 NOTE — Progress Notes (Signed)
[place] [phone]   [phone]  OFFICE PROGRESS NOTE  [name], [place] [address]  DIAGNOSIS: Stage IV (T1c, N1, M1c ) non-small cell lung cancer, adenocarcinoma diagnosed in July 2020 and presented with left upper lobe lung nodule in addition to right upper lobe lung nodule with left hilar lymphadenopathy as well as multiple brain metastasis.  Biomarker Findings Microsatellite status - MS-Stable Tumor Mutational Burden - 4 Muts/Mb Genomic Findings For a complete list of the genes assayed, please refer to the Appendix. ATM G204* KRAS G12C PDGFRA P122T SMO R199W 7 Disease relevant genes with no reportable alterations: ALK, BRAF, EGFR, ERBB2, MET, RET, ROS1  PDL 1 expression was 1%  PRIOR THERAPY: SBRT to multiple brain metastasis under the care of Dr. Isidore Moos.  CURRENT THERAPY: Systemic chemotherapy with carboplatin for AUC of 5, Alimta 500 mg/M2 and Keytruda 200 mg IV every 3 weeks.  First dose October 05, 2018.  Status post 31 cycles.  Starting from cycle #5 the patient will be on treatment with maintenance Alimta 500 mg/M2 and Keytruda 200 mg IV every 3 weeks.  She has been on treatment with single agent Keytruda recently secondary to renal insufficiency.  INTERVAL HISTORY: Carolyn [name] 65 y.o. female returns to the clinic today for follow-up visit.  The patient is feeling fine today with no concerning complaints.  She denied having any chest pain, shortness of breath, cough or hemoptysis.  She denied having any fever or chills.  She has 1 or 2 episodes of headache recently improved with Tylenol.  She denied having any nausea, vomiting, diarrhea or constipation.  She has no significant weight loss or night sweats.  She continues to tolerate her treatment with Keytruda fairly well.  The patient had repeat CT scan of the chest, abdomen pelvis performed recently and she is here for  evaluation and discussion of her risk her results.  MEDICAL HISTORY: Past Medical History:  Diagnosis Date  . Anginal pain ([place])    " with exertion "  . Arthritis    " 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 "  . Asthma    h/o  . Coronary artery disease   . Diabetes mellitus without complication ([place])    Type II  . GERD (gastroesophageal reflux disease)   . H/O hiatal hernia   . Heart murmur   . History of radiation therapy 09/22/2018   stereotactic radiosurgery   . 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 Hypertension   . nscl ca dx'd 08/07/18  . Persistent headaches    h/o  . Pneumonia    hx of PNA  . Shortness of breath   . Vaginal dryness    h/o    ALLERGIES:  is allergic to lisinopril.  MEDICATIONS:  Current Outpatient Medications  Medication Sig Dispense Refill  . acetaminophen (TYLENOL) 500 MG tablet Take 1,000 mg by mouth every 8 (eight) hours as needed for mild pain, fever or headache.     Marland Kitchen amLODipine (NORVASC) 10 MG tablet Take 1 tablet (10 mg total) by mouth daily. 90 tablet 3  . aspirin EC 81 MG tablet Take 81 mg by mouth daily.    Marland Kitchen azelastine (ASTELIN) 0.1 % nasal spray 1 spray ea nostril    . carvedilol (COREG) 25 MG tablet Take 1/2 (one-half) tablet by mouth twice daily 180 tablet 0  . Cholecalciferol (VITAMIN D-3) 125 MCG (5000 UT) TABS Take 5,000  Units by mouth daily.    . Chromium Picolinate 1000 MCG TABS Take 1,000 mg by mouth daily.    Marland Kitchen esomeprazole (NEXIUM) 20 MG capsule Take 1 capsule by mouth daily.    Marland Kitchen ezetimibe (ZETIA) 10 MG tablet TAKE 1 TABLET BY MOUTH ONCE DAILY AFTER SUPPER 30 tablet 0  . fexofenadine (ALLEGRA) 180 MG tablet 1 tablet swallow whole with water; do not take with fruit juices.    . folic acid (FOLVITE) 1 MG tablet folic acid 1 mg tablet    . Lancets (ONETOUCH DELICA PLUS [id number]) MISC SMARTSIG:1 Topical 3 Times Daily    . lidocaine-prilocaine (EMLA) cream Apply to the Port-A-Cath site 30 to 60 minutes before chemotherapy. 30 g 2  . metFORMIN (GLUCOPHAGE) 500 MG  tablet Take 500 mg by mouth 2 (two) times daily with a meal.     . methocarbamol (ROBAXIN) 500 MG tablet Take 1 tablet (500 mg total) by mouth every 6 (six) hours as needed for muscle spasms. 21 tablet 0  . montelukast (SINGULAIR) 10 MG tablet 1 tablet    . nitroGLYCERIN (NITROSTAT) 0.4 MG SL tablet Place 1 tablet (0.4 mg total) under the tongue every 5 (five) minutes as needed for chest pain. 25 tablet 2  . omega-3 acid ethyl esters (LOVAZA) 1 g capsule Lovaza 1 gram capsule  Take 2 capsules twice a day by oral route.    Glory Rosebush ULTRA test strip USE 1 STRIP TO CHECK GLUCOSE THREE TIMES DAILY    . Pitavastatin Calcium (LIVALO) 4 MG TABS Take 4 mg by mouth at bedtime.     Vladimir Faster Glycol-Propyl Glycol 0.4-0.3 % SOLN Place 1 drop into both eyes every 6 (six) hours as needed (dry eyes).     Marland Kitchen telmisartan (MICARDIS) 40 MG tablet Take 1 tablet by mouth daily.     No current facility-administered medications for this visit.    SURGICAL HISTORY:  Past Surgical History:  Procedure Laterality Date  . ABDOMINAL HYSTERECTOMY    . bone spur    . CARDIAC CATHETERIZATION  06/13/2012  . carpel tunnel surgery Left   . COLONOSCOPY     9 years ago  . CORONARY ANGIOPLASTY  06/13/2012  . EYE SURGERY     cyst left eye   . IR IMAGING GUIDED PORT INSERTION  10/10/2018  . LEFT HEART CATHETERIZATION WITH CORONARY ANGIOGRAM N/A 06/13/2012   Procedure: LEFT HEART CATHETERIZATION WITH CORONARY ANGIOGRAM;  Surgeon: Laverda Page, MD;  Location: [facility] CATH LAB;  Service: Cardiovascular;  Laterality: N/A;  . NECK SURGERY    . rotator cuff surgery Right 2015  . VIDEO BRONCHOSCOPY WITH ENDOBRONCHIAL NAVIGATION N/A 09/06/2018   Procedure: VIDEO BRONCHOSCOPY WITH ENDOBRONCHIAL NAVIGATION, left upper lung;  Surgeon: Lajuana Matte, MD;  Location: [facility];  Service: Thoracic;  Laterality: N/A;  . VIDEO BRONCHOSCOPY WITH ENDOBRONCHIAL ULTRASOUND Left 09/06/2018   Procedure: VIDEO BRONCHOSCOPY WITH ENDOBRONCHIAL  ULTRASOUND, left lung;  Surgeon: Lajuana Matte, MD;  Location: [facility];  Service: Thoracic;  Laterality: Left;  . WISDOM TOOTH EXTRACTION      REVIEW OF SYSTEMS:  Constitutional: positive for fatigue Eyes: negative Ears, nose, mouth, throat, and face: negative Respiratory: negative Cardiovascular: negative Gastrointestinal: negative Genitourinary:negative Integument/breast: negative Hematologic/lymphatic: negative Musculoskeletal:negative Neurological: positive for headaches Behavioral/Psych: negative Endocrine: negative Allergic/Immunologic: negative   PHYSICAL EXAMINATION: General appearance: alert, cooperative, fatigued and no distress Head: Normocephalic, without obvious abnormality, atraumatic Neck: no adenopathy, no JVD, supple, symmetrical, trachea midline and thyroid not  enlarged, symmetric, no tenderness/mass/nodules Lymph nodes: Cervical, supraclavicular, and axillary nodes normal. Resp: clear to auscultation bilaterally Back: symmetric, no curvature. ROM normal. No CVA tenderness. Cardio: regular rate and rhythm, S1, S2 normal, no murmur, click, rub or gallop GI: soft, non-tender; bowel sounds normal; no masses,  no organomegaly Extremities: extremities normal, atraumatic, no cyanosis or edema Neurologic: Alert and oriented X 3, normal strength and tone. Normal symmetric reflexes. Normal coordination and gait  ECOG PERFORMANCE STATUS: 1 - Symptomatic but completely ambulatory  Blood pressure (!) 151/65, pulse 64, temperature (!) 97.2 F (36.2 C), temperature source Tympanic, resp. rate 20, height $RemoveBe'5\' 1"'SXkfzCmnB$  (1.549 m), weight 180 lb 11.2 oz (82 kg), SpO2 100 %.  LABORATORY DATA: Lab Results  Component Value Date   WBC 6.8 07/16/2020   HGB 10.5 (L) 07/16/2020   HCT 34.7 (L) 07/16/2020   MCV 79.6 (L) 07/16/2020   PLT 262 07/16/2020      Chemistry      Component Value Date/Time   NA 142 06/26/2020 0819   K 3.8 06/26/2020 0819   CL 105 06/26/2020 0819   CO2 27  06/26/2020 0819   BUN 14 06/26/2020 0819   CREATININE 1.64 (H) 06/26/2020 0819      Component Value Date/Time   CALCIUM 9.4 06/26/2020 0819   ALKPHOS 76 06/26/2020 0819   AST 21 06/26/2020 0819   ALT 10 06/26/2020 0819   BILITOT 0.3 06/26/2020 0819       RADIOGRAPHIC STUDIES: CT Abdomen Pelvis Wo Contrast  Result Date: 07/15/2020 CLINICAL DATA:  Lung cancer.  Restaging. EXAM: CT CHEST, ABDOMEN AND PELVIS WITHOUT CONTRAST TECHNIQUE: Multidetector CT imaging of the chest, abdomen and pelvis was performed following the standard protocol without IV contrast. COMPARISON:  04/21/2020 FINDINGS: CT CHEST FINDINGS Cardiovascular: The heart size is normal. No substantial pericardial effusion. Coronary artery calcification is evident. Atherosclerotic calcification is noted in the wall of the thoracic aorta. Right Port-A-Cath tip is positioned in the mid SVC. Mediastinum/Nodes: No mediastinal lymphadenopathy. No evidence for gross hilar lymphadenopathy although assessment is limited by the lack of intravenous contrast on today's study. There is no axillary lymphadenopathy. The esophagus has normal imaging features. Lungs/Pleura: Tiny ground-glass nodules in the right upper lobe (image 63/6) and right lower lobe (images 61 and 63 of series 6) are stable in the interval. Irregular scarring in the posterior left upper lobe including 8 mm nodular component (47/6) is stable in the interval. No new suspicious pulmonary nodule or mass. No focal airspace consolidation. No pleural effusion. Musculoskeletal: No worrisome lytic or sclerotic osseous abnormality. CT ABDOMEN PELVIS FINDINGS Hepatobiliary: No focal abnormality in the liver on this study without intravenous contrast. Gallbladder is nondistended. No intrahepatic or extrahepatic biliary dilation. Pancreas: No focal mass lesion. No dilatation of the main duct. No intraparenchymal cyst. No peripancreatic edema. Spleen: No splenomegaly. No focal mass lesion.  Adrenals/Urinary Tract: No adrenal nodule or mass. Kidneys unremarkable. No evidence for hydroureter. The urinary bladder appears normal for the degree of distention. Stomach/Bowel: Tiny hiatal hernia. Stomach otherwise unremarkable. Duodenum is normally positioned as is the ligament of Treitz. Duodenal diverticulum noted. No small bowel wall thickening. No small bowel dilatation. The terminal ileum is normal. The appendix is normal. No gross colonic mass. No colonic wall thickening. Vascular/Lymphatic: There is abdominal aortic atherosclerosis without aneurysm. There is no gastrohepatic or hepatoduodenal ligament lymphadenopathy. No retroperitoneal or mesenteric lymphadenopathy. No pelvic sidewall lymphadenopathy. Reproductive: The uterus is surgically absent. There is no adnexal mass. Other: No  intraperitoneal free fluid. Musculoskeletal: No worrisome lytic or sclerotic osseous abnormality. IMPRESSION: 1. Stable exam. No new or progressive findings to suggest recurrent or metastatic disease in the chest, abdomen, or pelvis. 2. Tiny hiatal hernia. 3. Aortic Atherosclerosis (ICD10-I70.0). Electronically Signed   By: Misty Stanley M.D.   On: 07/15/2020 15:42   CT Chest Wo Contrast  Result Date: 07/15/2020 CLINICAL DATA:  Lung cancer.  Restaging. EXAM: CT CHEST, ABDOMEN AND PELVIS WITHOUT CONTRAST TECHNIQUE: Multidetector CT imaging of the chest, abdomen and pelvis was performed following the standard protocol without IV contrast. COMPARISON:  04/21/2020 FINDINGS: CT CHEST FINDINGS Cardiovascular: The heart size is normal. No substantial pericardial effusion. Coronary artery calcification is evident. Atherosclerotic calcification is noted in the wall of the thoracic aorta. Right Port-A-Cath tip is positioned in the mid SVC. Mediastinum/Nodes: No mediastinal lymphadenopathy. No evidence for gross hilar lymphadenopathy although assessment is limited by the lack of intravenous contrast on today's study. There is no  axillary lymphadenopathy. The esophagus has normal imaging features. Lungs/Pleura: Tiny ground-glass nodules in the right upper lobe (image 63/6) and right lower lobe (images 61 and 63 of series 6) are stable in the interval. Irregular scarring in the posterior left upper lobe including 8 mm nodular component (47/6) is stable in the interval. No new suspicious pulmonary nodule or mass. No focal airspace consolidation. No pleural effusion. Musculoskeletal: No worrisome lytic or sclerotic osseous abnormality. CT ABDOMEN PELVIS FINDINGS Hepatobiliary: No focal abnormality in the liver on this study without intravenous contrast. Gallbladder is nondistended. No intrahepatic or extrahepatic biliary dilation. Pancreas: No focal mass lesion. No dilatation of the main duct. No intraparenchymal cyst. No peripancreatic edema. Spleen: No splenomegaly. No focal mass lesion. Adrenals/Urinary Tract: No adrenal nodule or mass. Kidneys unremarkable. No evidence for hydroureter. The urinary bladder appears normal for the degree of distention. Stomach/Bowel: Tiny hiatal hernia. Stomach otherwise unremarkable. Duodenum is normally positioned as is the ligament of Treitz. Duodenal diverticulum noted. No small bowel wall thickening. No small bowel dilatation. The terminal ileum is normal. The appendix is normal. No gross colonic mass. No colonic wall thickening. Vascular/Lymphatic: There is abdominal aortic atherosclerosis without aneurysm. There is no gastrohepatic or hepatoduodenal ligament lymphadenopathy. No retroperitoneal or mesenteric lymphadenopathy. No pelvic sidewall lymphadenopathy. Reproductive: The uterus is surgically absent. There is no adnexal mass. Other: No intraperitoneal free fluid. Musculoskeletal: No worrisome lytic or sclerotic osseous abnormality. IMPRESSION: 1. Stable exam. No new or progressive findings to suggest recurrent or metastatic disease in the chest, abdomen, or pelvis. 2. Tiny hiatal hernia. 3. Aortic  Atherosclerosis (ICD10-I70.0). Electronically Signed   By: Misty Stanley M.D.   On: 07/15/2020 15:42    ASSESSMENT AND PLAN: This is a very pleasant 65 years old African-American female with likely stage IV (T1c, N1, M1C) non-small cell lung cancer, adenocarcinoma with K-ras G12C mutation diagnosed in July 2020 and presented with left upper lobe lung nodule in addition to right upper lobe pulmonary nodule as well as left hilar adenopathy and metastatic lesion to the brain. Molecular studies showed no actionable mutation and PDL 1 expression was 1%. The patient underwent SBRT to the metastatic brain lesion under the care of Dr. Isidore Moos. The patient is currently undergoing systemic chemotherapy with carboplatin for AUC of 5, Alimta 500 mg/M2 and Keytruda 200 mg IV every 3 weeks is status post 31 cycles.  Starting from cycle #5 she is on maintenance treatment with Alimta and Keytruda every 3 weeks with only Keytruda on the last few cycles  because of the renal insufficiency. The patient continues to tolerate this treatment well with no concerning adverse effects. She had repeat CT scan of the chest, abdomen pelvis performed recently.  I personally and independently reviewed the scans and discussed the results with the patient today. Her scan showed no concerning findings for disease progression. I recommended to continue her current treatment with immunotherapy with single agent Keytruda and she will proceed with cycle #32 today. For the hypertension she was advised to take her blood pressure medication and to monitor it closely at home. For the renal insufficiency, we will continue to monitor her renal function closely. For the anemia, the patient will continue on oral iron tablets. She will come back for follow-up visit in 3 weeks for evaluation before the next cycle of her treatment. The patient was advised to call immediately if she has any other concerning symptoms in the interval. The patient voices  understanding of current disease status and treatment options and is in agreement with the current care plan. All questions were answered. The patient knows to call the clinic with any problems, questions or concerns. We can certainly see the patient much sooner if necessary.  Disclaimer: This note was dictated with voice recognition software. Similar sounding words can inadvertently be transcribed and may not be corrected upon review.

## 2020-07-30 ENCOUNTER — Other Ambulatory Visit: Payer: Self-pay | Admitting: Cardiology

## 2020-07-30 DIAGNOSIS — E7800 Pure hypercholesterolemia, unspecified: Secondary | ICD-10-CM

## 2020-08-04 ENCOUNTER — Ambulatory Visit: Payer: Medicare Other | Admitting: Cardiology

## 2020-08-04 ENCOUNTER — Encounter: Payer: Self-pay | Admitting: Cardiology

## 2020-08-04 ENCOUNTER — Other Ambulatory Visit: Payer: Self-pay

## 2020-08-04 VITALS — BP 148/64 | HR 63 | Temp 98.3°F | Resp 17 | Ht 61.0 in | Wt 179.4 lb

## 2020-08-04 DIAGNOSIS — E1122 Type 2 diabetes mellitus with diabetic chronic kidney disease: Secondary | ICD-10-CM | POA: Diagnosis not present

## 2020-08-04 DIAGNOSIS — I10 Essential (primary) hypertension: Secondary | ICD-10-CM | POA: Diagnosis not present

## 2020-08-04 DIAGNOSIS — I25118 Atherosclerotic heart disease of native coronary artery with other forms of angina pectoris: Secondary | ICD-10-CM

## 2020-08-04 DIAGNOSIS — E7800 Pure hypercholesterolemia, unspecified: Secondary | ICD-10-CM | POA: Diagnosis not present

## 2020-08-04 DIAGNOSIS — N1832 Chronic kidney disease, stage 3b: Secondary | ICD-10-CM | POA: Diagnosis not present

## 2020-08-04 MED ORDER — EMPAGLIFLOZIN 25 MG PO TABS: 25.0000 mg | ORAL_TABLET | Freq: Every day | ORAL | 3 refills | Status: DC

## 2020-08-04 NOTE — Progress Notes (Signed)
Primary Physician/Referring:  [name], FNP  Patient ID: Carolyn [name], female    DOB: May 25, 1955, 65 y.o.   MRN: [id number]  Chief Complaint  Patient presents with   Follow-up    3 month   Hypertension   ed   Abnormal ECG   Hyperlipidemia   HPI:    HPI: Carolyn [name]  is a 65 y.o. 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  Extremely positive attitude and has been doing well. She continues to follow with oncology.   Patient is presently doing well.  She has not had any recurrence of angina pectoris and has not used any sublingual nitroglycerin in the recent past.  She has not had any symptoms of worsening dyspnea, PND or orthopnea..   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   Hypertension    nscl ca dx'd 08/07/18   Persistent headaches    h/o   Pneumonia    hx of PNA   Shortness of breath    Vaginal dryness    h/o   Family History  Problem Relation Age of Onset   Breast cancer Other    Diabetes Mother    Glaucoma Mother    Hypertension Mother    Hypertension Father    Asthma Father    Diabetes Sister    Diabetes Brother    Hypertension Sister    Past Surgical History:  Procedure Laterality Date   ABDOMINAL HYSTERECTOMY     bone spur     CARDIAC CATHETERIZATION  06/13/2012   carpel tunnel surgery Left    COLONOSCOPY     9 years  ago   CORONARY ANGIOPLASTY  06/13/2012   EYE SURGERY     cyst left eye    IR IMAGING GUIDED PORT INSERTION  10/10/2018   LEFT HEART CATHETERIZATION WITH CORONARY ANGIOGRAM N/A 06/13/2012   Procedure: LEFT HEART CATHETERIZATION WITH CORONARY ANGIOGRAM;  Surgeon: Laverda Page, MD;  Location: [facility] CATH LAB;  Service: Cardiovascular;  Laterality: N/A;   NECK SURGERY     rotator cuff surgery Right 2015   VIDEO BRONCHOSCOPY WITH ENDOBRONCHIAL NAVIGATION N/A 09/06/2018   Procedure: VIDEO BRONCHOSCOPY WITH ENDOBRONCHIAL NAVIGATION, left upper lung;  Surgeon: Lajuana Matte, MD;  Location: [place];  Service: Thoracic;  Laterality: N/A;   VIDEO BRONCHOSCOPY WITH ENDOBRONCHIAL ULTRASOUND Left 09/06/2018   Procedure: VIDEO BRONCHOSCOPY WITH ENDOBRONCHIAL ULTRASOUND, left lung;  Surgeon: Lajuana Matte, MD;  Location: [place];  Service: Thoracic;  Laterality: Left;   WISDOM TOOTH EXTRACTION     Social History   Tobacco Use   Smoking status: Former    Packs/day: 0.50    Years: 30.00    Pack years: 15.00    Types: Cigarettes  Quit date: 02/09/1999    Years since quitting: 21.4   Smokeless tobacco: Never  Substance Use Topics   Alcohol use: Not Currently  Marital Status: Married    ROS  Review of Systems  Cardiovascular:  Negative for chest pain, dyspnea on exertion and leg swelling.  Musculoskeletal:  Positive for arthritis.  Gastrointestinal:  Negative for melena.  Objective  Blood pressure (!) 148/64, pulse 63, temperature 98.3 F (36.8 C), temperature source Temporal, resp. rate 17, height 5\' 1"  (1.549 m), weight 179 lb 6.4 oz (81.4 kg), SpO2 100 %. Body mass index is 33.9 kg/m.   Vitals with BMI 08/04/2020 08/04/2020 07/16/2020  Height - 5\' 1"  5\' 1"   Weight - 179 lbs 6 oz 180 lbs 11 oz  BMI - 69.48 54.62  Systolic 703 500 938  Diastolic 64 75 65  Pulse 63 65 64    Physical Exam Vitals reviewed.  Constitutional:      Comments: Moderately built and mildly obese  HENT:      Head: Normocephalic and atraumatic.  Cardiovascular:     Rate and Rhythm: Normal rate and regular rhythm.     Pulses: Intact distal pulses.     Heart sounds: Normal heart sounds.     Comments: No JVD, No leg edema Pulmonary:     Effort: Pulmonary effort is normal. No accessory muscle usage or respiratory distress.     Breath sounds: Normal breath sounds.  Abdominal:     General: Bowel sounds are normal.     Palpations: Abdomen is soft.  Musculoskeletal:        General: Normal range of motion.     Cervical back: Normal range of motion.  Neurological:     General: No focal deficit present.     Mental Status: She is alert and oriented to person, place, and time.    Laboratory examination:   Recent Labs    10/18/19 1150 11/08/19 0900 11/12/19 0917 11/29/19 0910 06/04/20 0802 06/26/20 0819 07/16/20 0759  NA 141 141 140   < > 142 142 142  K 3.9 3.9 4.0   < > 3.9 3.8 3.9  CL 106 104 102   < > 106 105 105  CO2 27 29 28    < > 25 27 25   GLUCOSE 102* 131* 99   < > 171* 116* 150*  BUN 15 15 16    < > 14 14 14   CREATININE 1.76* 1.67* 1.64*   < > 1.71* 1.64* 1.65*  CALCIUM 9.6 9.2 9.2   < > 9.1 9.4 9.1  GFRNONAA 30* 32* 32*   < > 33* 34* 34*  GFRAA 34* 37* 37*  --   --   --   --    < > = values in this interval not displayed.   CMP Latest Ref Rng & Units 07/16/2020 06/26/2020 06/04/2020  Glucose 70 - 99 mg/dL 150(H) 116(H) 171(H)  BUN 8 - 23 mg/dL 14 14 14   Creatinine 0.44 - 1.00 mg/dL 1.65(H) 1.64(H) 1.71(H)  Sodium 135 - 145 mmol/L 142 142 142  Potassium 3.5 - 5.1 mmol/L 3.9 3.8 3.9  Chloride 98 - 111 mmol/L 105 105 106  CO2 22 - 32 mmol/L 25 27 25   Calcium 8.9 - 10.3 mg/dL 9.1 9.4 9.1  Total Protein 6.5 - 8.1 g/dL 7.2 7.1 7.4  Total Bilirubin 0.3 - 1.2 mg/dL 0.3 0.3 0.4  Alkaline Phos 38 - 126 U/L 79 76 82  AST 15 - 41 U/L 20 21  28  ALT 0 - 44 U/L 13 10 15    CBC Latest Ref Rng & Units 07/16/2020 06/26/2020 06/04/2020  WBC 4.0 - 10.5 K/uL 6.8 6.7 8.7  Hemoglobin 12.0 - 15.0  g/dL 10.5(L) 10.7(L) 10.9(L)  Hematocrit 36.0 - 46.0 % 34.7(L) 34.4(L) 36.2  Platelets 150 - 400 K/uL 262 265 262    TSH Recent Labs    06/04/20 0802 06/26/20 0819 07/16/20 0759  TSH 2.023 2.632 2.148  No results found for: CHOL, HDL, LDLCALC, LDLDIRECT, TRIG, CHOLHDL  External Labs 02/20/2018 03/17/2020: Hgb 11.1, HCT 36.5, platelet 249, MCV 79 Glucose 96, BUN 18, creatinine 1.67, GFR 32, sodium 142, potassium 4.9, A1c 6.0% Total cholesterol 169, triglycerides 54, HDL 58, LDL 100 TSH 2.50  05/28/2019: A1c 6.2%. Total cholesterol 173, triglycerides 63, HDL 60, LDL 100.  Non-HDL cholesterol 113.  A1C 6.600 09/04/2018 TSH 3.136 03/01/2019  Hemoglobin 8.800 03/01/2019. Platelets 295.000 03/01/2019 Creatinine, Serum 1.260 03/01/2019 Potassium 3.400 03/01/2019 Magnesium N/D ALT (SGPT) 24.000 03/01/2019  INR 1.000 10/10/2018  Allergies   Allergies  Allergen Reactions   Atorvastatin Other (See Comments)    Severe myalgia   Crestor [Rosuvastatin] Other (See Comments)    Severe myalgias.   Lisinopril Swelling    Lips and face swelled up.    Medications Prior to Visit:   Outpatient Medications Prior to Visit  Medication Sig Dispense Refill   acetaminophen (TYLENOL) 500 MG tablet Take 1,000 mg by mouth every 8 (eight) hours as needed for mild pain, fever or headache.      amLODipine (NORVASC) 10 MG tablet Take 1 tablet (10 mg total) by mouth daily. 90 tablet 3   aspirin EC 81 MG tablet Take 81 mg by mouth daily.     azelastine (ASTELIN) 0.1 % nasal spray 1 spray ea nostril     carvedilol (COREG) 25 MG tablet Take 1/2 (one-half) tablet by mouth twice daily 180 tablet 0   Cholecalciferol (VITAMIN D-3) 125 MCG (5000 UT) TABS Take 5,000 Units by mouth daily.     Chromium Picolinate 1000 MCG TABS Take 1,000 mg by mouth daily.     esomeprazole (NEXIUM) 20 MG capsule Take 1 capsule by mouth daily.     ezetimibe (ZETIA) 10 MG tablet TAKE 1 TABLET BY MOUTH ONCE DAILY AFTER SUPPER 30  tablet 0   fexofenadine (ALLEGRA) 180 MG tablet 1 tablet swallow whole with water; do not take with fruit juices.     Lancets (ONETOUCH DELICA PLUS [id number]) MISC SMARTSIG:1 Topical 3 Times Daily     lidocaine-prilocaine (EMLA) cream Apply to the Port-A-Cath site 30 to 60 minutes before chemotherapy. 30 g 2   metFORMIN (GLUCOPHAGE) 500 MG tablet Take 500 mg by mouth daily after breakfast.     methocarbamol (ROBAXIN) 500 MG tablet Take 1 tablet (500 mg total) by mouth every 6 (six) hours as needed for muscle spasms. 21 tablet 0   nitroGLYCERIN (NITROSTAT) 0.4 MG SL tablet Place 1 tablet (0.4 mg total) under the tongue every 5 (five) minutes as needed for chest pain. 25 tablet 2   ONETOUCH ULTRA test strip USE 1 STRIP TO CHECK GLUCOSE THREE TIMES DAILY     Pitavastatin Calcium (LIVALO) 4 MG TABS Take 4 mg by mouth at bedtime.      telmisartan (MICARDIS) 40 MG tablet Take 1 tablet by mouth daily.     folic acid (FOLVITE) 1 MG tablet folic acid 1 mg tablet     montelukast (SINGULAIR) 10 MG tablet 1 tablet  omega-3 acid ethyl esters (LOVAZA) 1 g capsule Lovaza 1 gram capsule  Take 2 capsules twice a day by oral route.     Polyethyl Glycol-Propyl Glycol 0.4-0.3 % SOLN Place 1 drop into both eyes every 6 (six) hours as needed (dry eyes).      No facility-administered medications prior to visit.   Final Medications at End of Visit    Current Meds  Medication Sig   acetaminophen (TYLENOL) 500 MG tablet Take 1,000 mg by mouth every 8 (eight) hours as needed for mild pain, fever or headache.    amLODipine (NORVASC) 10 MG tablet Take 1 tablet (10 mg total) by mouth daily.   aspirin EC 81 MG tablet Take 81 mg by mouth daily.   azelastine (ASTELIN) 0.1 % nasal spray 1 spray ea nostril   carvedilol (COREG) 25 MG tablet Take 1/2 (one-half) tablet by mouth twice daily   Cholecalciferol (VITAMIN D-3) 125 MCG (5000 UT) TABS Take 5,000 Units by mouth daily.   Chromium Picolinate 1000 MCG TABS Take 1,000  mg by mouth daily.   empagliflozin (JARDIANCE) 25 MG TABS tablet Take 1 tablet (25 mg total) by mouth daily before breakfast.   esomeprazole (NEXIUM) 20 MG capsule Take 1 capsule by mouth daily.   ezetimibe (ZETIA) 10 MG tablet TAKE 1 TABLET BY MOUTH ONCE DAILY AFTER SUPPER   fexofenadine (ALLEGRA) 180 MG tablet 1 tablet swallow whole with water; do not take with fruit juices.   Lancets (ONETOUCH DELICA PLUS [id number]) MISC SMARTSIG:1 Topical 3 Times Daily   lidocaine-prilocaine (EMLA) cream Apply to the Port-A-Cath site 30 to 60 minutes before chemotherapy.   metFORMIN (GLUCOPHAGE) 500 MG tablet Take 500 mg by mouth daily after breakfast.   methocarbamol (ROBAXIN) 500 MG tablet Take 1 tablet (500 mg total) by mouth every 6 (six) hours as needed for muscle spasms.   nitroGLYCERIN (NITROSTAT) 0.4 MG SL tablet Place 1 tablet (0.4 mg total) under the tongue every 5 (five) minutes as needed for chest pain.   ONETOUCH ULTRA test strip USE 1 STRIP TO CHECK GLUCOSE THREE TIMES DAILY   Pitavastatin Calcium (LIVALO) 4 MG TABS Take 4 mg by mouth at bedtime.    telmisartan (MICARDIS) 40 MG tablet Take 1 tablet by mouth daily.    Radiology:  No results found.   Cardiac Studies:   cardiac catheterization on 06/13/2012: High-grade 99% mid LAD stenosis, successful PTCA and stenting with 4.0 x 24 mm Veriflex non drug eluting stent.  Exercise tetrofosmin stress test  04/07/2020: Normal ECG stress. The patient exercised for 7 minutes and 58 seconds of a Bruce protocol, achieving approximately 10.11 METs.  No chest pain. NOrmap BP response. Myocardial perfusion is normal. Overall LV systolic function is normal without regional wall motion abnormalities. Stress LV EF: 64%. No previous exam available for comparison. Low risk .  Echocardiogram 04/09/2020:  Left ventricle cavity is normal in size. Moderate concentric hypertrophy  of the left ventricle. Normal global wall motion. Normal LV systolic  function  with EF 61%. Doppler evidence of grade I (impaired) diastolic  dysfunction, normal LAP.  Left atrial cavity is mildly dilated.  Mild (Grade I) mitral regurgitation.  Mild tricuspid regurgitation.  No evidence of pulmonary hypertension.  Study in 2014 showed mild LVH, trace MR and TR>  EKG   EKG 01/25/2021: Sinus bradycardia at rate of 55 bpm, borderline left atrial enlargement, normal axis.  Diffuse nonspecific T abnormality, cannot exclude inferior and lateral ischemia.  T wave inversion more  prominent compared to EKG 03/09/2019.  Assessment     ICD-10-CM   1. Primary hypertension  N86 Basic metabolic panel    2. Atherosclerosis of native coronary artery of native heart with stable angina pectoris ([place])  I25.118     3. Type 2 diabetes mellitus with stage 3b chronic kidney disease, without long-term current use of insulin (HCC)  E11.22 empagliflozin (JARDIANCE) 25 MG TABS tablet   N18.32     4. Hypercholesteremia  E78.00 Lipid Panel With LDL/HDL Ratio    Lipoprotein A (LPA)    LDL cholesterol, direct    Apo A1 + B + Ratio      Meds ordered this encounter  Medications   empagliflozin (JARDIANCE) 25 MG TABS tablet    Sig: Take 1 tablet (25 mg total) by mouth daily before breakfast.    Dispense:  30 tablet    Refill:  3   Medications Discontinued During This Encounter  Medication Reason   folic acid (FOLVITE) 1 MG tablet Error   montelukast (SINGULAIR) 10 MG tablet Error   omega-3 acid ethyl esters (LOVAZA) 1 g capsule Error   Polyethyl Glycol-Propyl Glycol 0.4-0.3 % SOLN Error    Orders Placed This Encounter  Procedures   Lipid Panel With LDL/HDL Ratio   Lipoprotein A (LPA)   LDL cholesterol, direct   Apo A1 + B + Ratio   Basic metabolic panel   Recommendations:   Carolyn [name] is a 65 y.o.  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  Extremely positive attitude and has been doing well. She continues to follow with oncology.   Patient is presently doing well.  She has not had any recurrence of angina pectoris and has not used any sublingual nitroglycerin in the recent past.  She has not had any symptoms to suggest heart failure either.  I reviewed her labs, LDL is not at goal and also non-HDL cholesterol is also slightly high although her HDL is at goal.  She will benefit from being on PCSK9 inhibitors.  I would like to obtain baseline lipids along with LPA and APO B and A1 ratio prior to initiation of Repatha.  With regard to hyperlipidemia and also hypertension, weight loss would certainly help.  In view of her cardiovascular risk, I will reduce the dose of metformin to 5 mg once a day and will start her on Jardiance 25 mg daily.  She will also get BMP 1 week to 10 days later along with her lipids.  I would like to see her back in 6 weeks for follow-up.  Extensive discussion regarding weight loss.     Adrian Prows, MD, [facility] 08/04/2020, 10:37 PM Office: [phone] Fax: [phone] Pager: [phone]

## 2020-08-05 ENCOUNTER — Other Ambulatory Visit: Payer: Self-pay | Admitting: Cardiology

## 2020-08-07 ENCOUNTER — Other Ambulatory Visit: Payer: Self-pay

## 2020-08-07 ENCOUNTER — Inpatient Hospital Stay: Payer: Medicare Other

## 2020-08-07 ENCOUNTER — Encounter: Payer: Self-pay | Admitting: Internal Medicine

## 2020-08-07 ENCOUNTER — Inpatient Hospital Stay (HOSPITAL_BASED_OUTPATIENT_CLINIC_OR_DEPARTMENT_OTHER): Payer: Medicare Other | Admitting: Internal Medicine

## 2020-08-07 VITALS — BP 123/59 | HR 67 | Temp 97.9°F | Resp 20 | Ht 61.0 in | Wt 177.9 lb

## 2020-08-07 DIAGNOSIS — C3492 Malignant neoplasm of unspecified part of left bronchus or lung: Secondary | ICD-10-CM

## 2020-08-07 DIAGNOSIS — Z5112 Encounter for antineoplastic immunotherapy: Secondary | ICD-10-CM | POA: Diagnosis not present

## 2020-08-07 DIAGNOSIS — C3412 Malignant neoplasm of upper lobe, left bronchus or lung: Secondary | ICD-10-CM

## 2020-08-07 DIAGNOSIS — C3491 Malignant neoplasm of unspecified part of right bronchus or lung: Secondary | ICD-10-CM

## 2020-08-07 DIAGNOSIS — Z79899 Other long term (current) drug therapy: Secondary | ICD-10-CM

## 2020-08-07 DIAGNOSIS — I10 Essential (primary) hypertension: Secondary | ICD-10-CM | POA: Diagnosis not present

## 2020-08-07 DIAGNOSIS — C7931 Secondary malignant neoplasm of brain: Secondary | ICD-10-CM

## 2020-08-07 DIAGNOSIS — R5383 Other fatigue: Secondary | ICD-10-CM

## 2020-08-07 LAB — CMP (CANCER CENTER ONLY)
ALT: 10 U/L (ref 0–44)
AST: 18 U/L (ref 15–41)
Albumin: 3.4 g/dL — ABNORMAL LOW (ref 3.5–5.0)
Alkaline Phosphatase: 76 U/L (ref 38–126)
Anion gap: 9 (ref 5–15)
BUN: 19 mg/dL (ref 8–23)
CO2: 25 mmol/L (ref 22–32)
Calcium: 9.1 mg/dL (ref 8.9–10.3)
Chloride: 105 mmol/L (ref 98–111)
Creatinine: 1.96 mg/dL — ABNORMAL HIGH (ref 0.44–1.00)
GFR, Estimated: 28 mL/min — ABNORMAL LOW (ref >60)
Glucose, Bld: 173 mg/dL — ABNORMAL HIGH (ref 70–99)
Potassium: 4.2 mmol/L (ref 3.5–5.1)
Sodium: 139 mmol/L (ref 135–145)
Total Bilirubin: 0.3 mg/dL (ref 0.3–1.2)
Total Protein: 7.3 g/dL (ref 6.5–8.1)

## 2020-08-07 LAB — CBC WITH DIFFERENTIAL (CANCER CENTER ONLY)
Abs Immature Granulocytes: 0.02 10*3/uL (ref 0.00–0.07)
Basophils Absolute: 0 10*3/uL (ref 0.0–0.1)
Basophils Relative: 1 %
Eosinophils Absolute: 0.6 10*3/uL — ABNORMAL HIGH (ref 0.0–0.5)
Eosinophils Relative: 7 %
HCT: 35.4 % — ABNORMAL LOW (ref 36.0–46.0)
Hemoglobin: 10.9 g/dL — ABNORMAL LOW (ref 12.0–15.0)
Immature Granulocytes: 0 %
Lymphocytes Relative: 21 %
Lymphs Abs: 1.6 10*3/uL (ref 0.7–4.0)
MCH: 24.4 pg — ABNORMAL LOW (ref 26.0–34.0)
MCHC: 30.8 g/dL (ref 30.0–36.0)
MCV: 79.2 fL — ABNORMAL LOW (ref 80.0–100.0)
Monocytes Absolute: 0.6 10*3/uL (ref 0.1–1.0)
Monocytes Relative: 8 %
Neutro Abs: 4.9 10*3/uL (ref 1.7–7.7)
Neutrophils Relative %: 63 %
Platelet Count: 279 10*3/uL (ref 150–400)
RBC: 4.47 MIL/uL (ref 3.87–5.11)
RDW: 16.2 % — ABNORMAL HIGH (ref 11.5–15.5)
WBC Count: 7.7 10*3/uL (ref 4.0–10.5)
nRBC: 0 % (ref 0.0–0.2)

## 2020-08-07 LAB — TSH: TSH: 2.516 u[IU]/mL (ref 0.308–3.960)

## 2020-08-07 MED ORDER — HEPARIN SOD (PORK) LOCK FLUSH 100 UNIT/ML IV SOLN
500.0000 [IU] | Freq: Once | INTRAVENOUS | Status: AC | PRN
  Administered 2020-08-07: 500 [IU]
  Filled 2020-08-07: qty 5

## 2020-08-07 MED ORDER — SODIUM CHLORIDE 0.9 % IV SOLN
Freq: Once | INTRAVENOUS | Status: AC
  Filled 2020-08-07: qty 250

## 2020-08-07 MED ORDER — SODIUM CHLORIDE 0.9 % IV SOLN
200.0000 mg | Freq: Once | INTRAVENOUS | Status: AC
  Administered 2020-08-07: 200 mg via INTRAVENOUS
  Filled 2020-08-07: qty 8

## 2020-08-07 MED ORDER — SODIUM CHLORIDE 0.9% FLUSH
10.0000 mL | INTRAVENOUS | Status: DC | PRN
  Administered 2020-08-07: 10 mL
  Filled 2020-08-07: qty 10

## 2020-08-07 NOTE — Progress Notes (Signed)
Okay to treat with elevated creat per Dr. Julien Nordmann

## 2020-08-07 NOTE — Patient Instructions (Signed)
[facility]  Discharge Instructions: ?Thank you for choosing [facility] to provide your oncology and hematology care.  ? ?If you have a lab appointment with the Cancer Center, please go directly to the Cancer Center and check in at the registration area. ?  ?Wear comfortable clothing and clothing appropriate for easy access to any Portacath or PICC line.  ? ?We strive to give you quality time with your provider. You may need to reschedule your appointment if you arrive late (15 or more minutes).  Arriving late affects you and other patients whose appointments are after yours.  Also, if you miss three or more appointments without notifying the office, you may be dismissed from the clinic at the provider?s discretion.    ?  ?For prescription refill requests, have your pharmacy contact our office and allow 72 hours for refills to be completed.   ? ?Today you received the following chemotherapy and/or immunotherapy agents: Keytruda ?  ?To help prevent nausea and vomiting after your treatment, we encourage you to take your nausea medication as directed. ? ?BELOW ARE SYMPTOMS THAT SHOULD BE REPORTED IMMEDIATELY: ?*FEVER GREATER THAN 100.4 F (38 ?C) OR HIGHER ?*CHILLS OR SWEATING ?*NAUSEA AND VOMITING THAT IS NOT CONTROLLED WITH YOUR NAUSEA MEDICATION ?*UNUSUAL SHORTNESS OF BREATH ?*UNUSUAL BRUISING OR BLEEDING ?*URINARY PROBLEMS (pain or burning when urinating, or frequent urination) ?*BOWEL PROBLEMS (unusual diarrhea, constipation, pain near the anus) ?TENDERNESS IN MOUTH AND THROAT WITH OR WITHOUT PRESENCE OF ULCERS (sore throat, sores in mouth, or a toothache) ?UNUSUAL RASH, SWELLING OR PAIN  ?UNUSUAL VAGINAL DISCHARGE OR ITCHING  ? ?Items with * indicate a potential emergency and should be followed up as soon as possible or go to the Emergency Department if any problems should occur. ? ?Please show the CHEMOTHERAPY ALERT CARD or IMMUNOTHERAPY ALERT CARD at check-in to the  Emergency Department and triage nurse. ? ?Should you have questions after your visit or need to cancel or reschedule your appointment, please contact [facility]  Dept: [phone]  and follow the prompts.  Office hours are 8:00 a.m. to 4:30 p.m. Monday - Friday. Please note that voicemails left after 4:00 p.m. may not be returned until the following business day.  We are closed weekends and major holidays. You have access to a nurse at all times for urgent questions. Please call the main number to the clinic Dept: [phone] and follow the prompts. ? ? ?For any non-urgent questions, you may also contact your provider using MyChart. We now offer e-Visits for anyone 18 and older to request care online for non-urgent symptoms. For details visit [email or web address]. ?  ?Also download the MyChart app! Go to the app store, search "MyChart", open the app, select [facility], and log in with your MyChart username and password. ? ?Due to Covid, a mask is required upon entering the hospital/clinic. If you do not have a mask, one will be given to you upon arrival. For doctor visits, patients may have 1 support person aged 18 or older with them. For treatment visits, patients cannot have anyone with them due to current Covid guidelines and our immunocompromised population.  ? ?

## 2020-08-07 NOTE — Progress Notes (Signed)
[facility] [phone]   [phone]  OFFICE PROGRESS NOTE  [name], [place] [address]  DIAGNOSIS: Stage IV (T1c, N1, M1c ) non-small cell lung cancer, adenocarcinoma diagnosed in July 2020 and presented with left upper lobe lung nodule in addition to right upper lobe lung nodule with left hilar lymphadenopathy as well as multiple brain metastasis.  Biomarker Findings Microsatellite status - MS-Stable Tumor Mutational Burden - 4 Muts/Mb Genomic Findings For a complete list of the genes assayed, please refer to the Appendix. ATM G204* KRAS G12C PDGFRA P122T SMO R199W 7 Disease relevant genes with no reportable alterations: ALK, BRAF, EGFR, ERBB2, MET, RET, ROS1  PDL 1 expression was 1%  PRIOR THERAPY: SBRT to multiple brain metastasis under the care of Dr. Isidore Moos.  CURRENT THERAPY: Systemic chemotherapy with carboplatin for AUC of 5, Alimta 500 mg/M2 and Keytruda 200 mg IV every 3 weeks.  First dose October 05, 2018.  Status post 32 cycles.  Starting from cycle #5 the patient will be on treatment with maintenance Alimta 500 mg/M2 and Keytruda 200 mg IV every 3 weeks.  She has been on treatment with single agent Keytruda recently secondary to renal insufficiency.  INTERVAL HISTORY: Carolyn [name] 65 y.o. female returns to the clinic today for follow-up visit.  The patient is feeling fine today with no concerning complaints.  She denied having any fatigue or weakness.  She denied having any chest pain, shortness of breath, cough or hemoptysis.  She has no nausea, vomiting, diarrhea or constipation.  She was seen recently by her primary cardiologist and advised to lose weight and cut on the salt in her diet.  Her blood pressure is much better today.  She is here for evaluation before starting cycle #3 of her treatment.  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  MEDICATIONS:  Current Outpatient Medications  Medication Sig Dispense Refill   acetaminophen (TYLENOL) 500 MG tablet Take 1,000 mg by mouth every 8 (eight) hours as needed for mild pain, fever or headache.      amLODipine (NORVASC) 10 MG tablet Take 1 tablet (10 mg total) by mouth daily. 90 tablet 3   aspirin EC 81 MG tablet Take 81 mg by mouth daily.     azelastine (ASTELIN) 0.1 % nasal spray 1 spray ea nostril     carvedilol (COREG) 25 MG tablet Take 1/2 (one-half) tablet by mouth twice daily 180 tablet 0   Cholecalciferol (VITAMIN D-3) 125 MCG (5000 UT) TABS Take 5,000 Units by mouth daily.     Chromium Picolinate 1000 MCG TABS Take 1,000  mg by mouth daily.     empagliflozin (JARDIANCE) 25 MG TABS tablet Take 1 tablet (25 mg total) by mouth daily before breakfast. 30 tablet 3   esomeprazole (NEXIUM) 20 MG capsule Take 1 capsule by mouth daily.     ezetimibe (ZETIA) 10 MG tablet TAKE 1 TABLET BY MOUTH ONCE DAILY AFTER SUPPER 30 tablet 0   fexofenadine (ALLEGRA) 180 MG tablet 1 tablet swallow whole with water; do not take with fruit juices.     Lancets (ONETOUCH DELICA PLUS [id number]) MISC SMARTSIG:1 Topical 3 Times Daily     lidocaine-prilocaine (EMLA) cream Apply to the Port-A-Cath site 30 to 60 minutes before chemotherapy. 30 g 2   metFORMIN (GLUCOPHAGE) 500 MG tablet Take 500 mg by mouth daily after breakfast.      methocarbamol (ROBAXIN) 500 MG tablet Take 1 tablet (500 mg total) by mouth every 6 (six) hours as needed for muscle spasms. 21 tablet 0   nitroGLYCERIN (NITROSTAT) 0.4 MG SL tablet Place 1 tablet (0.4 mg total) under the tongue every 5 (five) minutes as needed for chest pain. 25 tablet 2   ONETOUCH ULTRA test strip USE 1 STRIP TO CHECK GLUCOSE THREE TIMES DAILY     Pitavastatin Calcium (LIVALO) 4 MG TABS Take 4 mg by mouth at bedtime.      telmisartan (MICARDIS) 40 MG tablet Take 1 tablet by mouth daily.     No current facility-administered medications for this visit.    SURGICAL HISTORY:  Past Surgical History:  Procedure Laterality Date   ABDOMINAL HYSTERECTOMY     bone spur     CARDIAC CATHETERIZATION  06/13/2012   carpel tunnel surgery Left    COLONOSCOPY     9 years ago   CORONARY ANGIOPLASTY  06/13/2012   EYE SURGERY     cyst left eye    IR IMAGING GUIDED PORT INSERTION  10/10/2018   LEFT HEART CATHETERIZATION WITH CORONARY ANGIOGRAM N/A 06/13/2012   Procedure: LEFT HEART CATHETERIZATION WITH CORONARY ANGIOGRAM;  Surgeon: Laverda Page, MD;  Location: [facility] CATH LAB;  Service: Cardiovascular;  Laterality: N/A;   NECK SURGERY     rotator cuff surgery Right 2015   VIDEO BRONCHOSCOPY WITH ENDOBRONCHIAL NAVIGATION N/A 09/06/2018   Procedure: VIDEO BRONCHOSCOPY WITH ENDOBRONCHIAL NAVIGATION, left upper lung;  Surgeon: Lajuana Matte, MD;  Location: [facility];  Service: Thoracic;  Laterality: N/A;   VIDEO BRONCHOSCOPY WITH ENDOBRONCHIAL ULTRASOUND Left 09/06/2018   Procedure: VIDEO BRONCHOSCOPY WITH ENDOBRONCHIAL ULTRASOUND, left lung;  Surgeon: Lajuana Matte, MD;  Location: [facility];  Service: Thoracic;  Laterality: Left;   WISDOM TOOTH EXTRACTION      REVIEW OF SYSTEMS:  A comprehensive review of systems was negative.   PHYSICAL EXAMINATION: General appearance: alert, cooperative, fatigued, and no distress Head: Normocephalic, without obvious abnormality, atraumatic Neck: no  adenopathy, no JVD, supple, symmetrical, trachea midline, and thyroid not enlarged, symmetric, no tenderness/mass/nodules Lymph nodes: Cervical, supraclavicular, and axillary nodes normal. Resp: clear to auscultation bilaterally Back: symmetric, no curvature. ROM normal. No CVA tenderness. Cardio: regular rate and rhythm, S1, S2 normal, no murmur, click, rub or gallop GI: soft, non-tender; bowel sounds normal; no masses,  no organomegaly Extremities: extremities normal, atraumatic, no cyanosis or edema  ECOG PERFORMANCE STATUS: 1 - Symptomatic but completely ambulatory  Blood pressure (!) 123/59, pulse 67, temperature 97.9 F (36.6 C), temperature source Tympanic, resp. rate 20, height $RemoveBe'5\' 1"'eXokvMxUR$  (1.549 m), weight 177 lb 14.4 oz (80.7 kg), SpO2 100 %.  LABORATORY DATA: Lab Results  Component Value Date   WBC 6.8 07/16/2020   HGB 10.5 (L) 07/16/2020   HCT 34.7 (L) 07/16/2020   MCV 79.6 (L) 07/16/2020   PLT 262 07/16/2020      Chemistry      Component Value Date/Time   NA 142 07/16/2020 0759   K 3.9 07/16/2020 0759   CL 105 07/16/2020 0759   CO2 25 07/16/2020 0759   BUN 14 07/16/2020 0759   CREATININE 1.65 (H) 07/16/2020 0759      Component Value Date/Time   CALCIUM 9.1 07/16/2020 0759   ALKPHOS 79 07/16/2020 0759   AST 20 07/16/2020 0759   ALT 13 07/16/2020 0759   BILITOT 0.3 07/16/2020 0759       RADIOGRAPHIC STUDIES: CT Abdomen Pelvis Wo Contrast  Result Date: 07/15/2020 CLINICAL DATA:  Lung cancer.  Restaging. EXAM: CT CHEST, ABDOMEN AND PELVIS WITHOUT CONTRAST TECHNIQUE: Multidetector CT imaging of the chest, abdomen and pelvis was performed following the standard protocol without IV contrast. COMPARISON:  04/21/2020 FINDINGS: CT CHEST FINDINGS Cardiovascular: The heart size is normal. No substantial pericardial effusion. Coronary artery calcification is evident. Atherosclerotic calcification is noted in the wall of the thoracic aorta. Right Port-A-Cath tip is positioned in  the mid SVC. Mediastinum/Nodes: No mediastinal lymphadenopathy. No evidence for gross hilar lymphadenopathy although assessment is limited by the lack of intravenous contrast on today's study. There is no axillary lymphadenopathy. The esophagus has normal imaging features. Lungs/Pleura: Tiny ground-glass nodules in the right upper lobe (image 63/6) and right lower lobe (images 61 and 63 of series 6) are stable in the interval. Irregular scarring in the posterior left upper lobe including 8 mm nodular component (47/6) is stable in the interval. No new suspicious pulmonary nodule or mass. No focal airspace consolidation. No pleural effusion. Musculoskeletal: No worrisome lytic or sclerotic osseous abnormality. CT ABDOMEN PELVIS FINDINGS Hepatobiliary: No focal abnormality in the liver on this study without intravenous contrast. Gallbladder is nondistended. No intrahepatic or extrahepatic biliary dilation. Pancreas: No focal mass lesion. No dilatation of the main duct. No intraparenchymal cyst. No peripancreatic edema. Spleen: No splenomegaly. No focal mass lesion. Adrenals/Urinary Tract: No adrenal nodule or mass. Kidneys unremarkable. No evidence for hydroureter. The urinary bladder appears normal for the degree of distention. Stomach/Bowel: Tiny hiatal hernia. Stomach otherwise unremarkable. Duodenum is normally positioned as is the ligament of Treitz. Duodenal diverticulum noted. No small bowel wall thickening. No small bowel dilatation. The terminal ileum is normal. The appendix is normal. No gross colonic mass. No colonic wall thickening. Vascular/Lymphatic: There is abdominal aortic atherosclerosis without aneurysm. There is no gastrohepatic or hepatoduodenal ligament lymphadenopathy. No retroperitoneal or mesenteric lymphadenopathy. No pelvic sidewall lymphadenopathy. Reproductive: The uterus is surgically absent. There is no adnexal mass. Other: No intraperitoneal free fluid. Musculoskeletal: No worrisome  lytic or sclerotic osseous abnormality. IMPRESSION: 1. Stable exam. No new or progressive findings to suggest recurrent or metastatic disease in the chest, abdomen, or pelvis. 2. Tiny hiatal hernia. 3. Aortic Atherosclerosis (ICD10-I70.0). Electronically Signed   By: Misty Stanley M.D.   On: 07/15/2020 15:42   CT Chest Wo Contrast  Result Date: 07/15/2020 CLINICAL DATA:  Lung cancer.  Restaging. EXAM: CT CHEST, ABDOMEN AND PELVIS WITHOUT CONTRAST TECHNIQUE: Multidetector CT imaging of the chest, abdomen and pelvis was performed following the standard protocol without IV contrast. COMPARISON:  04/21/2020 FINDINGS: CT CHEST FINDINGS Cardiovascular: The heart size is normal. No substantial pericardial effusion. Coronary artery calcification is evident. Atherosclerotic  calcification is noted in the wall of the thoracic aorta. Right Port-A-Cath tip is positioned in the mid SVC. Mediastinum/Nodes: No mediastinal lymphadenopathy. No evidence for gross hilar lymphadenopathy although assessment is limited by the lack of intravenous contrast on today's study. There is no axillary lymphadenopathy. The esophagus has normal imaging features. Lungs/Pleura: Tiny ground-glass nodules in the right upper lobe (image 63/6) and right lower lobe (images 61 and 63 of series 6) are stable in the interval. Irregular scarring in the posterior left upper lobe including 8 mm nodular component (47/6) is stable in the interval. No new suspicious pulmonary nodule or mass. No focal airspace consolidation. No pleural effusion. Musculoskeletal: No worrisome lytic or sclerotic osseous abnormality. CT ABDOMEN PELVIS FINDINGS Hepatobiliary: No focal abnormality in the liver on this study without intravenous contrast. Gallbladder is nondistended. No intrahepatic or extrahepatic biliary dilation. Pancreas: No focal mass lesion. No dilatation of the main duct. No intraparenchymal cyst. No peripancreatic edema. Spleen: No splenomegaly. No focal mass  lesion. Adrenals/Urinary Tract: No adrenal nodule or mass. Kidneys unremarkable. No evidence for hydroureter. The urinary bladder appears normal for the degree of distention. Stomach/Bowel: Tiny hiatal hernia. Stomach otherwise unremarkable. Duodenum is normally positioned as is the ligament of Treitz. Duodenal diverticulum noted. No small bowel wall thickening. No small bowel dilatation. The terminal ileum is normal. The appendix is normal. No gross colonic mass. No colonic wall thickening. Vascular/Lymphatic: There is abdominal aortic atherosclerosis without aneurysm. There is no gastrohepatic or hepatoduodenal ligament lymphadenopathy. No retroperitoneal or mesenteric lymphadenopathy. No pelvic sidewall lymphadenopathy. Reproductive: The uterus is surgically absent. There is no adnexal mass. Other: No intraperitoneal free fluid. Musculoskeletal: No worrisome lytic or sclerotic osseous abnormality. IMPRESSION: 1. Stable exam. No new or progressive findings to suggest recurrent or metastatic disease in the chest, abdomen, or pelvis. 2. Tiny hiatal hernia. 3. Aortic Atherosclerosis (ICD10-I70.0). Electronically Signed   By: Misty Stanley M.D.   On: 07/15/2020 15:42     ASSESSMENT AND PLAN: This is a very pleasant 65 years old African-American female with likely stage IV (T1c, N1, M1C) non-small cell lung cancer, adenocarcinoma with K-ras G12C mutation diagnosed in July 2020 and presented with left upper lobe lung nodule in addition to right upper lobe pulmonary nodule as well as left hilar adenopathy and metastatic lesion to the brain. Molecular studies showed no actionable mutation and PDL 1 expression was 1%. The patient underwent SBRT to the metastatic brain lesion under the care of Dr. Isidore Moos. The patient is currently undergoing systemic chemotherapy with carboplatin for AUC of 5, Alimta 500 mg/M2 and Keytruda 200 mg IV every 3 weeks is status post 32 cycles.  Starting from cycle #5 she is on maintenance  treatment with Alimta and Keytruda every 3 weeks with only Keytruda on the last few cycles because of the renal insufficiency. The patient continues to tolerate her treatment well with no concerning adverse effects. I recommended for her to proceed with cycle #3 today as planned. I will see her back for follow-up visit in 3 weeks for evaluation before the next cycle of her treatment. For the renal insufficiency, we will continue to monitor her renal function closely. For the anemia, the patient will continue on oral iron tablets. The patient was advised to call immediately if she has any concerning symptoms in the interval. The patient voices understanding of current disease status and treatment options and is in agreement with the current care plan. All questions were answered. The patient knows to call the  clinic with any problems, questions or concerns. We can certainly see the patient much sooner if necessary.  Disclaimer: This note was dictated with voice recognition software. Similar sounding words can inadvertently be transcribed and may not be corrected upon review.

## 2020-08-15 ENCOUNTER — Other Ambulatory Visit: Payer: Self-pay

## 2020-08-15 ENCOUNTER — Ambulatory Visit
Admission: RE | Admit: 2020-08-15 | Discharge: 2020-08-15 | Disposition: A | Discharge status: Home / Self Care | Payer: Medicare Other | Source: Ambulatory Visit | Attending: Internal Medicine | Admitting: Internal Medicine

## 2020-08-15 DIAGNOSIS — C719 Malignant neoplasm of brain, unspecified: Secondary | ICD-10-CM | POA: Diagnosis not present

## 2020-08-15 DIAGNOSIS — C7931 Secondary malignant neoplasm of brain: Secondary | ICD-10-CM

## 2020-08-15 DIAGNOSIS — G9389 Other specified disorders of brain: Secondary | ICD-10-CM | POA: Diagnosis not present

## 2020-08-15 MED ORDER — GADOBENATE DIMEGLUMINE 529 MG/ML IV SOLN: 20.0000 mL | Freq: Once | INTRAVENOUS | Status: DC | PRN

## 2020-08-15 MED ORDER — GADOBENATE DIMEGLUMINE 529 MG/ML IV SOLN
16.0000 mL | Freq: Once | INTRAVENOUS | Status: AC | PRN
  Administered 2020-08-15: 16 mL via INTRAVENOUS

## 2020-08-17 ENCOUNTER — Other Ambulatory Visit: Payer: Self-pay | Admitting: Cardiology

## 2020-08-17 DIAGNOSIS — E7800 Pure hypercholesterolemia, unspecified: Secondary | ICD-10-CM

## 2020-08-18 ENCOUNTER — Telehealth: Payer: Self-pay

## 2020-08-18 DIAGNOSIS — E7800 Pure hypercholesterolemia, unspecified: Secondary | ICD-10-CM | POA: Diagnosis not present

## 2020-08-18 DIAGNOSIS — I10 Essential (primary) hypertension: Secondary | ICD-10-CM | POA: Diagnosis not present

## 2020-08-18 NOTE — Telephone Encounter (Signed)
Voice message received requesting to call report for pts 08/15/20 brain MRI.  I have return the call to acknowledge the message.  Impression #1 is reason for call report.

## 2020-08-19 ENCOUNTER — Encounter: Payer: Self-pay | Admitting: Internal Medicine

## 2020-08-19 LAB — BASIC METABOLIC PANEL
BUN/Creatinine Ratio: 10 — ABNORMAL LOW (ref 12–28)
BUN: 16 mg/dL (ref 8–27)
CO2: 24 mmol/L (ref 20–29)
Calcium: 9.6 mg/dL (ref 8.7–10.3)
Chloride: 102 mmol/L (ref 96–106)
Creatinine, Ser: 1.66 mg/dL — ABNORMAL HIGH (ref 0.57–1.00)
Glucose: 105 mg/dL — ABNORMAL HIGH (ref 65–99)
Potassium: 4.7 mmol/L (ref 3.5–5.2)
Sodium: 139 mmol/L (ref 134–144)
eGFR: 34 mL/min/{1.73_m2} — ABNORMAL LOW (ref >59)

## 2020-08-19 LAB — LIPID PANEL WITH LDL/HDL RATIO
Cholesterol, Total: 132 mg/dL (ref 100–199)
HDL: 50 mg/dL (ref >39)
LDL Chol Calc (NIH): 66 mg/dL (ref 0–99)
LDL/HDL Ratio: 1.3 ratio (ref 0.0–3.2)
Triglycerides: 80 mg/dL (ref 0–149)
VLDL Cholesterol Cal: 16 mg/dL (ref 5–40)

## 2020-08-19 LAB — LDL CHOLESTEROL, DIRECT: LDL Direct: 56 mg/dL (ref 0–99)

## 2020-08-19 LAB — APO A1 + B + RATIO
Apolipo. B/A-1 Ratio: 0.4 ratio (ref 0.0–0.6)
Apolipoprotein A-1: 155 mg/dL (ref 116–209)
Apolipoprotein B: 57 mg/dL (ref <90)

## 2020-08-19 LAB — LIPOPROTEIN A (LPA): Lipoprotein (a): 107.3 nmol/L — ABNORMAL HIGH (ref <75.0)

## 2020-08-21 ENCOUNTER — Inpatient Hospital Stay: Payer: Medicare Other | Attending: Physician Assistant | Admitting: Internal Medicine

## 2020-08-21 ENCOUNTER — Other Ambulatory Visit: Payer: Self-pay

## 2020-08-21 VITALS — BP 128/59 | HR 66 | Temp 98.5°F | Resp 18 | Ht 61.0 in | Wt 178.8 lb

## 2020-08-21 DIAGNOSIS — Z79899 Other long term (current) drug therapy: Secondary | ICD-10-CM | POA: Insufficient documentation

## 2020-08-21 DIAGNOSIS — C7931 Secondary malignant neoplasm of brain: Secondary | ICD-10-CM | POA: Insufficient documentation

## 2020-08-21 DIAGNOSIS — N289 Disorder of kidney and ureter, unspecified: Secondary | ICD-10-CM | POA: Diagnosis not present

## 2020-08-21 DIAGNOSIS — D649 Anemia, unspecified: Secondary | ICD-10-CM | POA: Diagnosis not present

## 2020-08-21 DIAGNOSIS — C3412 Malignant neoplasm of upper lobe, left bronchus or lung: Secondary | ICD-10-CM | POA: Diagnosis not present

## 2020-08-21 DIAGNOSIS — Z5112 Encounter for antineoplastic immunotherapy: Secondary | ICD-10-CM | POA: Insufficient documentation

## 2020-08-21 DIAGNOSIS — Z87891 Personal history of nicotine dependence: Secondary | ICD-10-CM | POA: Insufficient documentation

## 2020-08-21 DIAGNOSIS — R519 Headache, unspecified: Secondary | ICD-10-CM | POA: Insufficient documentation

## 2020-08-21 NOTE — Progress Notes (Signed)
[facility] [place], [place] [address] [phone]   Interval Evaluation  Date of Service: 08/21/20 Patient Name: Carolyn [name] Patient MRN: [id number] Patient DOB: 1956-01-03 Provider: Ventura Sellers, MD  Identifying Statement:  Carolyn [name] is a 65 y.o. female with Brain metastases ([place]) [C79.31]   Primary Cancer:  Oncologic History: Oncology History  Adenocarcinoma, lung ([place])  09/12/2018 Initial Diagnosis   Adenocarcinoma, lung ([place])    10/05/2018 -  Chemotherapy    Patient is on Treatment Plan: LUNG  MAINTENANCE PEMETREXED + PEMBROLIZUMAB       05/14/2020 Cancer Staging   Staging form: Lung, AJCC 8th Edition - Clinical: Stage IVB (cT1c, cN1, cM1c) - Signed by Curt Bears, MD on 05/14/2020     CNS Oncologic History: 09/22/18: Completes SRS to 9 sub-cm metastases Isidore Moos)  Interval History:  [name] presents today for follow up after recent MRI brain.  She denies new or progressive neurologic deficits.  Headaches have been sporadic and self limited. Remains phsyically and mentally active. Continues with Keytruda through Dr. Julien Nordmann.  Medications: Current Outpatient Medications on File Prior to Visit  Medication Sig Dispense Refill   acetaminophen (TYLENOL) 500 MG tablet Take 1,000 mg by mouth every 8 (eight) hours as needed for mild pain, fever or headache.      amLODipine (NORVASC) 10 MG tablet Take 1 tablet (10 mg total) by mouth daily. 90 tablet 3   aspirin EC 81 MG tablet Take 81 mg by mouth daily.     azelastine (ASTELIN) 0.1 % nasal spray 1 spray ea nostril     carvedilol (COREG) 25 MG tablet Take 1/2 (one-half) tablet by mouth twice daily 180 tablet 0   Cholecalciferol (VITAMIN D-3) 125 MCG (5000 UT) TABS Take 5,000 Units by mouth daily.     Chromium Picolinate 1000 MCG TABS Take 1,000 mg by mouth daily.     empagliflozin (JARDIANCE) 25 MG TABS tablet Take 1 tablet (25 mg total) by  mouth daily before breakfast. (Patient taking differently: Take 25 mg by mouth at bedtime.) 30 tablet 3   esomeprazole (NEXIUM) 20 MG capsule Take 1 capsule by mouth daily.     ezetimibe (ZETIA) 10 MG tablet TAKE 1 TABLET BY MOUTH ONCE DAILY AFTER SUPPER 30 tablet 0   fexofenadine (ALLEGRA) 180 MG tablet 1 tablet swallow whole with water; do not take with fruit juices.     Lancets (ONETOUCH DELICA PLUS [id number]) MISC SMARTSIG:1 Topical 3 Times Daily     lidocaine-prilocaine (EMLA) cream Apply to the Port-A-Cath site 30 to 60 minutes before chemotherapy. 30 g 2   metFORMIN (GLUCOPHAGE) 500 MG tablet Take 500 mg by mouth daily after breakfast.     nitroGLYCERIN (NITROSTAT) 0.4 MG SL tablet Place 1 tablet (0.4 mg total) under the tongue every 5 (five) minutes as needed for chest pain. 25 tablet 2   ONETOUCH ULTRA test strip USE 1 STRIP TO CHECK GLUCOSE THREE TIMES DAILY     Pitavastatin Calcium (LIVALO) 4 MG TABS Take 4 mg by mouth at bedtime.      telmisartan (MICARDIS) 40 MG tablet Take 1 tablet by mouth daily.     methocarbamol (ROBAXIN) 500 MG tablet Take 1 tablet (500 mg total) by mouth every 6 (six) hours as needed for muscle spasms. (Patient not taking: Reported on 08/21/2020) 21 tablet 0   No current facility-administered medications on file prior to visit.    Allergies:  Allergies  Allergen Reactions   Atorvastatin Other (See Comments)    Severe myalgia   Crestor [Rosuvastatin] Other (See Comments)    Severe myalgias.   Lisinopril Swelling    Lips and face swelled up.   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   Hypertension    nscl ca dx'd 08/07/18   Persistent headaches    h/o   Pneumonia    hx of PNA   Shortness of breath    Vaginal dryness    h/o   Past Surgical History:  Past Surgical History:  Procedure Laterality Date   ABDOMINAL HYSTERECTOMY     bone spur     CARDIAC CATHETERIZATION  06/13/2012   carpel tunnel surgery Left    COLONOSCOPY     9 years ago   CORONARY ANGIOPLASTY  06/13/2012   EYE SURGERY     cyst left eye    IR IMAGING GUIDED PORT INSERTION  10/10/2018   LEFT HEART CATHETERIZATION WITH CORONARY ANGIOGRAM N/A 06/13/2012   Procedure: LEFT HEART CATHETERIZATION WITH CORONARY ANGIOGRAM;  Surgeon: Laverda Page, MD;  Location[facility] CATH LAB;  Service: Cardiovascular;  Laterality: N/A;   NECK SURGERY     rotator cuff surgery Right 2015   VIDEO BRONCHOSCOPY WITH ENDOBRONCHIAL NAVIGATION N/A 09/06/2018   Procedure: VIDEO BRONCHOSCOPY WITH ENDOBRONCHIAL NAVIGATION, left upper lung;  Surgeon: Lajuana Matte, MD;  Location: [facility] OR;  Service: Thoracic;  Laterality: N/A;   VIDEO BRONCHOSCOPY WITH ENDOBRONCHIAL ULTRASOUND Left 09/06/2018   Procedure: VIDEO BRONCHOSCOPY WITH ENDOBRONCHIAL ULTRASOUND, left lung;  Surgeon: Lajuana Matte, MD;  Location: [facility];  Service: Thoracic;  Laterality: Left;   WISDOM TOOTH EXTRACTION     Social History:  Social History   Socioeconomic History   Marital status: Married    Spouse name: Not on file   Number of children: 3   Years of education: Not on file   Highest education level: Not on file  Occupational History   Not on file  Tobacco Use   Smoking status: Former    Packs/day: 0.50    Years: 30.00    Pack years: 15.00    Types: Cigarettes    Quit date: 02/09/1999    Years since quitting: 21.5   Smokeless tobacco: Never  Vaping Use   Vaping Use: Never used  Substance and Sexual Activity   Alcohol use: Not Currently   Drug use: Not Currently   Sexual activity: Yes    Birth control/protection: Post-menopausal   Other Topics Concern   Not on file  Social History Narrative   Not on file   Social Determinants of Health   Financial Resource Strain: Not on file  Food Insecurity: Not on file  Transportation Needs: Not on file  Physical Activity: Not on file  Stress: Not on file  Social Connections: Not on file  Intimate Partner Violence: Not on file   Family History:  Family History  Problem Relation Age  of Onset   Breast cancer Other    Diabetes Mother    Glaucoma Mother    Hypertension Mother    Hypertension Father    Asthma Father    Diabetes Sister    Diabetes Brother    Hypertension Sister     Review of Systems: Constitutional: Doesn't report fevers, chills or abnormal weight loss Eyes: Doesn't report blurriness of vision Ears, nose, mouth, throat, and face: Doesn't report sore throat Respiratory: Doesn't report cough, dyspnea or wheezes Cardiovascular: Doesn't report palpitation, chest discomfort  Gastrointestinal:  Doesn't report nausea, constipation, diarrhea GU: Doesn't report incontinence Skin: Doesn't report skin rashes Neurological: Per HPI Musculoskeletal: Doesn't report joint pain Behavioral/Psych: Doesn't report anxiety  Physical Exam: Vitals:   08/21/20 1139  BP: (!) 128/59  Pulse: 66  Resp: 18  Temp: 98.5 F (36.9 C)  SpO2: 100%   KPS: 90. General: Alert, cooperative, pleasant, in no acute distress Head: Normal EENT: No conjunctival injection or scleral icterus.  Lungs: Resp effort normal Cardiac: Regular rate Abdomen: Non-distended abdomen Skin: No rashes cyanosis or petechiae. Extremities: No clubbing or edema  Neurologic Exam: Mental Status: Awake, alert, attentive to examiner. Oriented to self and environment. Language is fluent with intact comprehension.  Cranial Nerves: Visual acuity is grossly normal. Visual fields are full. Extra-ocular movements intact. No ptosis. Face is symmetric Motor: Tone and bulk are normal. Power is full in both  arms and legs. Reflexes are symmetric, no pathologic reflexes present.  Sensory: Intact to light touch Gait: Normal.   Labs: I have reviewed the data as listed    Component Value Date/Time   NA 139 08/18/2020 0801   K 4.7 08/18/2020 0801   CL 102 08/18/2020 0801   CO2 24 08/18/2020 0801   GLUCOSE 105 (H) 08/18/2020 0801   GLUCOSE 173 (H) 08/07/2020 0722   BUN 16 08/18/2020 0801   CREATININE 1.66 (H) 08/18/2020 0801   CREATININE 1.96 (H) 08/07/2020 0722   CALCIUM 9.6 08/18/2020 0801   PROT 7.3 08/07/2020 0722   ALBUMIN 3.4 (L) 08/07/2020 0722   AST 18 08/07/2020 0722   ALT 10 08/07/2020 0722   ALKPHOS 76 08/07/2020 0722   BILITOT 0.3 08/07/2020 0722   GFRNONAA 28 (L) 08/07/2020 0722   GFRAA 37 (L) 11/12/2019 0917   GFRAA 37 (L) 11/08/2019 0900   Lab Results  Component Value Date   WBC 7.7 08/07/2020   NEUTROABS 4.9 08/07/2020   HGB 10.9 (L) 08/07/2020   HCT 35.4 (L) 08/07/2020   MCV 79.2 (L) 08/07/2020   PLT 279 08/07/2020    Imaging:  MR BRAIN W WO CONTRAST  Result Date: 08/16/2020 CLINICAL DATA:  Brain/CNS neoplasm, assess treatment response. EXAM: MRI HEAD WITHOUT AND WITH CONTRAST TECHNIQUE: Multiplanar, multiecho pulse sequences of the brain and surrounding structures were obtained without and with intravenous contrast. CONTRAST:  69mL MULTIHANCE GADOBENATE DIMEGLUMINE 529 MG/ML IV SOLN COMPARISON:  MRI 02/14/2020. FINDINGS: Brain: No acute infarction, hemorrhage, hydrocephalus, or extra-axial fluid collection. Unchanged 2 mm focus of enhancement in the inferolateral left cerebellum (series 12, image 26). New 4 mm focus of enhancement in the anterior left temporal lobe (see series 11, image 69). There are a few scattered T2/FLAIR hyperintensities within the white matter, which are similar to prior. Similar encephalomalacia in the inferior left greater than right cerebellum. Vascular: Major arterial flow voids are maintained at the skull base. Skull and upper cervical  spine: Normal marrow signal. Partially imaged cervical ACDF. Sinuses/Orbits: Mild ethmoid air cell  mucosal thickening without air-fluid levels. Unremarkable orbits. Other: No mastoid effusions. IMPRESSION: 1. New 4 mm focus of enhancement in the anterior left temporal lobe (see series 11, image 69), concerning for a new metastasis. 2. Unchanged 2 mm focus of enhancement in the inferolateral left cerebellum, stable over multiple priors. These results will be called to the ordering clinician or representative by the Radiologist Assistant, and communication documented in the PACS or [employer]. Electronically Signed   By: Margaretha Sheffield MD   On: 08/16/2020 10:45     [facility] Clinician Interpretation: I have personally reviewed the radiological images as listed.  My interpretation, in the context of the patient's clinical presentation, is progressive disease   Assessment/Plan Brain metastases ([place]) [C79.31]  JISELL MAJER is clinicallly stable today.  MRI brain demonstrates small new sub-cm left anterior temporal metastases.  Treatment plan was fused to this lesion, it was not previously treated.  We recommended salvage radiosurgery for this new lesion, radiation oncology has already reviewed.  Patient is agreeable with plan and verbally consented today.  CT-sim is scheduled already for next Monday 08/25/20.  No steroids needed at this time.  We ask that [name] return to clinic in 3 months following post-SRS MRI, or sooner as needed.  May treat headaches with PRN Tylenol as prior.  We appreciate the opportunity to participate in the care of [name].   All questions were answered. The patient knows to call the clinic with any problems, questions or concerns. No barriers to learning were detected.  I have spent a total of 30 minutes of face-to-face and non-face-to-face time, excluding clinical staff time, preparing to see patient, ordering tests and/or medications,  counseling the patient, and independently interpreting results and communicating results to the patient/family/caregiver    Ventura Sellers, MD Medical Director of Neuro-Oncology [facility] [place] 08/21/20 11:46 AM

## 2020-08-22 DIAGNOSIS — Z20822 Contact with and (suspected) exposure to covid-19: Secondary | ICD-10-CM | POA: Diagnosis not present

## 2020-08-25 ENCOUNTER — Encounter: Payer: Self-pay | Admitting: Radiation Oncology

## 2020-08-25 ENCOUNTER — Ambulatory Visit
Admission: RE | Admit: 2020-08-25 | Discharge: 2020-08-25 | Disposition: A | Discharge status: Home / Self Care | Payer: Medicare Other | Source: Ambulatory Visit | Attending: Radiation Oncology | Admitting: Radiation Oncology

## 2020-08-25 ENCOUNTER — Other Ambulatory Visit: Payer: Self-pay

## 2020-08-25 VITALS — BP 151/68 | HR 60 | Temp 97.0°F | Resp 18

## 2020-08-25 VITALS — BP 151/68 | HR 60 | Temp 97.0°F | Resp 18 | Ht 61.0 in | Wt 179.5 lb

## 2020-08-25 DIAGNOSIS — C7931 Secondary malignant neoplasm of brain: Secondary | ICD-10-CM | POA: Diagnosis not present

## 2020-08-25 DIAGNOSIS — Z08 Encounter for follow-up examination after completed treatment for malignant neoplasm: Secondary | ICD-10-CM | POA: Diagnosis not present

## 2020-08-25 DIAGNOSIS — Z510 Encounter for antineoplastic radiation therapy: Secondary | ICD-10-CM | POA: Diagnosis not present

## 2020-08-25 DIAGNOSIS — Z95828 Presence of other vascular implants and grafts: Secondary | ICD-10-CM

## 2020-08-25 DIAGNOSIS — C801 Malignant (primary) neoplasm, unspecified: Secondary | ICD-10-CM | POA: Diagnosis not present

## 2020-08-25 MED ORDER — SODIUM CHLORIDE 0.9% FLUSH
10.0000 mL | Freq: Once | INTRAVENOUS | Status: AC
  Administered 2020-08-25: 10 mL via INTRAVENOUS

## 2020-08-25 MED ORDER — HEPARIN SOD (PORK) LOCK FLUSH 100 UNIT/ML IV SOLN
500.0000 [IU] | Freq: Once | INTRAVENOUS | Status: AC
  Administered 2020-08-25: 500 [IU] via INTRAVENOUS

## 2020-08-25 NOTE — Progress Notes (Signed)
Ms. Fuqua presents today for re-consult of SRS treatment to a new sub-cm left anterior temporal metastases  MRI Brain w/ w/o Contrast 08/15/2020 IMPRESSION: 1. New 4 mm focus of enhancement in the anterior left temporal lobe (see series 11, image 69), concerning for a new metastasis. 2. Unchanged 2 mm focus of enhancement in the inferolateral left cerebellum, stable over multiple priors.   Patient denies any new or worsening neurological concerns. She does report feeling an occasional "nagging" feeling to the upper left side of her head, but states it doesn't become a full headache. Denies any balance or mobility concerns. No vision changes. Overall she reports she feels well  Today's Vitals   08/25/20 0945  BP: (!) 151/68  Pulse: 60  Resp: 18  Temp: (!) 97 F (36.1 C)  TempSrc: Temporal  SpO2: 100%  Weight: 179 lb 8 oz (81.4 kg)  Height: 5\' 1"  (1.549 m)

## 2020-08-25 NOTE — Progress Notes (Signed)
Has armband been applied?  Yes.    Does patient have an allergy to IV contrast dye?: No.   Has patient ever received premedication for IV contrast dye?: No.   Does patient take metformin?: Yes.    If patient does take metformin when was the last dose: August 25, 2020  Date of lab work: August 18, 2020 BUN: 16 CR: 1.66 eGFR: 34  IV site: subclavian right, condition patent and no redness  Has IV site been added to flowsheet?  Yes.    BP (!) 151/68 (BP Location: Left Arm, Patient Position: Sitting)   Pulse 60   Temp (!) 97 F (36.1 C) (Temporal)   Resp 18   SpO2 100%

## 2020-08-25 NOTE — Progress Notes (Signed)
Radiation Oncology         [phone]) [phone] ________________________________  Outpatient Re-Consultation  Name: Carolyn [name] MRN: [id number]  Date: 08/25/2020  DOB: 1955/07/05  SA:[id number], Le[place] Reader, FNP  Curt Bears, MD   REFERRING PHYSICIAN: Curt Bears, MD  DIAGNOSIS:     ICD-10-CM   1. Brain metastases ([place])  C79.31        HISTORY OF PRESENT ILLNESS::Carolyn [name] is a 65 y.o. female who presents today for re-consultation of SRS treatment to a new left anterior temporal metastasis.   In June of 2020, the patient was diagnosed with highly suspicious stage IIIA/B non-small cell lung cancer   PET scan on 08/28/2018 showed the dominant left upper lobe pulmonary nodule to be hypermetabolic. The hypermetaboloc lymphadenopathy to the legt hilum was noted to be compatible with metastatic involvement. The 9 mm right ypper lobe pulmonary nodule was notably suspicious for neoplasm due to FDG uptake.  Staging MRI brain showed multiple brain metastases  In August 2020 she received stereotactic radiosurgery to 9 brain metastases and tolerated this well -see details below.  She has been tolerating Keytruda well.  CT taken on 07/15/20 showed no new or progressive findings to suggest recurrent or metastatic disease in the chest, abdomen, or pelvis.   MRI of the brain on 08/15/20 demonstrated a new 4 mm focus of enhancement in the anterior left temporal lobe; concerning for a new metastasis. Also seen was the previously seen unchanged 2 mm focus of enhancement in the inferolateral left cerebellum, stable over multiple priors.  She was discussed at CNS tumor board with consensus to proceed with radiation planning, stereotactic radiosurgery.  I personally reviewed her images and shared the images with the patient  She has a history for smoking around 0.5 pack/day for 30 years and quit 18 years ago. She has no history of alcohol or drug abuse.  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  She states that her son was recently diagnosed with cancer and is undergoing treatment for this.  She enjoys spending time with her grandchildren.  Patient denies any new or worsening neurological concerns. She does report feeling an occasional "nagging" feeling to the upper left side of her head, but states it doesn't become a full headache. Denies any balance or mobility concerns. No vision changes. Overall she reports she feels well   PREVIOUS RADIATION THERAPY: Yes SRS treatment  Radiation Treatment Dates: 09/22/2018 through 09/22/2018 Site Technique Total Dose Dose per Fx Completed Fx Beam Energies  Brain: Brain_SRS IMRT 20/20 20 1/1 [id number]   Narrative: The patient tolerated radiation therapy relatively well. Carolyn [name] received stereotactic radiosurgery to the following targets: ExacTrac, 6 vmat beams max dose = 127.8% PTV1 Ant Rt Frontal 39mm PTV2 Lt Frontal 58mm PTV3 Rt Frontal 75mm PTV4 Rt Parietal 70mm PTV5 Lt Parietal 40mm PTV6 Lt Parietal 59mm PTV7 Lt Temporal 83mm PTV8 Post Rt Frontal 3mm PTV9 Lt Sup Temporal 35mm    PAST MEDICAL HISTORY:  has a past medical history of Anginal pain ([place]), Arthritis, Asthma, Coronary artery disease, Diabetes mellitus without complication ([place]), GERD (gastroesophageal reflux disease), H/O hiatal hernia, Heart murmur, History of radiation therapy (09/22/2018), HPV in female, Hypertension, nscl ca (dx'd 08/07/18), Persistent headaches, Pneumonia, Shortness of breath, and Vaginal dryness.    PAST SURGICAL HISTORY: Past Surgical History:  Procedure Laterality Date   ABDOMINAL HYSTERECTOMY     bone spur     CARDIAC CATHETERIZATION  06/13/2012   carpel tunnel  surgery Left    COLONOSCOPY     9 years ago   CORONARY ANGIOPLASTY  06/13/2012   EYE SURGERY     cyst left eye    IR IMAGING GUIDED PORT INSERTION  10/10/2018   LEFT HEART CATHETERIZATION WITH CORONARY ANGIOGRAM N/A 06/13/2012   Procedure: LEFT HEART CATHETERIZATION WITH  CORONARY ANGIOGRAM;  Surgeon: Laverda Page, MD;  Location: [facility] CATH LAB;  Service: Cardiovascular;  Laterality: N/A;   NECK SURGERY     rotator cuff surgery Right 2015   VIDEO BRONCHOSCOPY WITH ENDOBRONCHIAL NAVIGATION N/A 09/06/2018   Procedure: VIDEO BRONCHOSCOPY WITH ENDOBRONCHIAL NAVIGATION, left upper lung;  Surgeon: Lajuana Matte, MD;  Location: [place];  Service: Thoracic;  Laterality: N/A;   VIDEO BRONCHOSCOPY WITH ENDOBRONCHIAL ULTRASOUND Left 09/06/2018   Procedure: VIDEO BRONCHOSCOPY WITH ENDOBRONCHIAL ULTRASOUND, left lung;  Surgeon: Lajuana Matte, MD;  Location: [place];  Service: Thoracic;  Laterality: Left;   WISDOM TOOTH EXTRACTION      FAMILY HISTORY: family history includes Asthma in her father; Breast cancer in an other family member; Diabetes in her brother, mother, and sister; Glaucoma in her mother; Hypertension in her father, mother, and sister.  SOCIAL HISTORY:  reports that she quit smoking about 21 years ago. Her smoking use included cigarettes. She has a 15.00 pack-year smoking history. She has never used smokeless tobacco. She reports previous alcohol use. She reports previous drug use.  ALLERGIES: Atorvastatin, Crestor [rosuvastatin], and Lisinopril  MEDICATIONS:  Current Outpatient Medications  Medication Sig Dispense Refill   acetaminophen (TYLENOL) 500 MG tablet Take 1,000 mg by mouth every 8 (eight) hours as needed for mild pain, fever or headache.      amLODipine (NORVASC) 10 MG tablet Take 1 tablet (10 mg total) by mouth daily. 90 tablet 3   aspirin EC 81 MG tablet Take 81 mg by mouth daily.     azelastine (ASTELIN) 0.1 % nasal spray 1 spray ea nostril     carvedilol (COREG) 25 MG tablet Take 1/2 (one-half) tablet by mouth twice daily 180 tablet 0   Cholecalciferol (VITAMIN D-3) 125 MCG (5000 UT) TABS Take 5,000 Units by mouth daily.     Chromium Picolinate 1000 MCG TABS Take 1,000 mg by mouth daily.     empagliflozin (JARDIANCE) 25 MG TABS  tablet Take 1 tablet (25 mg total) by mouth daily before breakfast. (Patient taking differently: Take 25 mg by mouth at bedtime.) 30 tablet 3   esomeprazole (NEXIUM) 20 MG capsule Take 1 capsule by mouth daily.     ezetimibe (ZETIA) 10 MG tablet TAKE 1 TABLET BY MOUTH ONCE DAILY AFTER SUPPER 30 tablet 0   fexofenadine (ALLEGRA) 180 MG tablet 1 tablet swallow whole with water; do not take with fruit juices.     Lancets (ONETOUCH DELICA PLUS [id number]) MISC SMARTSIG:1 Topical 3 Times Daily     lidocaine-prilocaine (EMLA) cream Apply to the Port-A-Cath site 30 to 60 minutes before chemotherapy. 30 g 2   metFORMIN (GLUCOPHAGE) 500 MG tablet Take 500 mg by mouth daily after breakfast.     methocarbamol (ROBAXIN) 500 MG tablet Take 1 tablet (500 mg total) by mouth every 6 (six) hours as needed for muscle spasms. (Patient not taking: Reported on 08/21/2020) 21 tablet 0   nitroGLYCERIN (NITROSTAT) 0.4 MG SL tablet Place 1 tablet (0.4 mg total) under the tongue every 5 (five) minutes as needed for chest pain. 25 tablet 2   ONETOUCH ULTRA test strip USE 1 STRIP  TO CHECK GLUCOSE THREE TIMES DAILY     Pitavastatin Calcium (LIVALO) 4 MG TABS Take 4 mg by mouth at bedtime.      telmisartan (MICARDIS) 40 MG tablet Take 1 tablet by mouth daily.     No current facility-administered medications for this encounter.    REVIEW OF SYSTEMS:  As above.   PHYSICAL EXAM:  height is 5\' 1"  (1.549 m) and weight is 179 lb 8 oz (81.4 kg). Her temporal temperature is 97 F (36.1 C) (abnormal). Her blood pressure is 151/68 (abnormal) and her pulse is 60. Her respiration is 18 and oxygen saturation is 100%.   General: Alert and oriented, in no acute distress  -pleasant to speak with HEENT: Head is normocephalic. Extraocular movements are intact. Musculoskeletal: symmetric strength and muscle tone throughout. Neurologic: Cranial nerves II through XII are grossly intact. No obvious focalities. Speech is fluent. Coordination is  intact. Psychiatric: Judgment and insight are intact. Affect is appropriate.   LABORATORY DATA:  Lab Results  Component Value Date   WBC 7.7 08/07/2020   HGB 10.9 (L) 08/07/2020   HCT 35.4 (L) 08/07/2020   MCV 79.2 (L) 08/07/2020   PLT 279 08/07/2020   CMP     Component Value Date/Time   NA 139 08/18/2020 0801   K 4.7 08/18/2020 0801   CL 102 08/18/2020 0801   CO2 24 08/18/2020 0801   GLUCOSE 105 (H) 08/18/2020 0801   GLUCOSE 173 (H) 08/07/2020 0722   BUN 16 08/18/2020 0801   CREATININE 1.66 (H) 08/18/2020 0801   CREATININE 1.96 (H) 08/07/2020 0722   CALCIUM 9.6 08/18/2020 0801   PROT 7.3 08/07/2020 0722   ALBUMIN 3.4 (L) 08/07/2020 0722   AST 18 08/07/2020 0722   ALT 10 08/07/2020 0722   ALKPHOS 76 08/07/2020 0722   BILITOT 0.3 08/07/2020 0722   GFRNONAA 28 (L) 08/07/2020 0722   GFRAA 37 (L) 11/12/2019 0917   GFRAA 37 (L) 11/08/2019 0900         RADIOGRAPHY: MR BRAIN W WO CONTRAST  Result Date: 08/16/2020 CLINICAL DATA:  Brain/CNS neoplasm, assess treatment response. EXAM: MRI HEAD WITHOUT AND WITH CONTRAST TECHNIQUE: Multiplanar, multiecho pulse sequences of the brain and surrounding structures were obtained without and with intravenous contrast. CONTRAST:  44mL MULTIHANCE GADOBENATE DIMEGLUMINE 529 MG/ML IV SOLN COMPARISON:  MRI 02/14/2020. FINDINGS: Brain: No acute infarction, hemorrhage, hydrocephalus, or extra-axial fluid collection. Unchanged 2 mm focus of enhancement in the inferolateral left cerebellum (series 12, image 26). New 4 mm focus of enhancement in the anterior left temporal lobe (see series 11, image 69). There are a few scattered T2/FLAIR hyperintensities within the white matter, which are similar to prior. Similar encephalomalacia in the inferior left greater than right cerebellum. Vascular: Major arterial flow voids are maintained at the skull base. Skull and upper cervical spine: Normal marrow signal. Partially imaged cervical ACDF. Sinuses/Orbits: Mild  ethmoid air cell mucosal thickening without air-fluid levels. Unremarkable orbits. Other: No mastoid effusions. IMPRESSION: 1. New 4 mm focus of enhancement in the anterior left temporal lobe (see series 11, image 69), concerning for a new metastasis. 2. Unchanged 2 mm focus of enhancement in the inferolateral left cerebellum, stable over multiple priors. These results will be called to the ordering clinician or representative by the Radiologist Assistant, and communication documented in the PACS or [employer]. Electronically Signed   By: Margaretha Sheffield MD   On: 08/16/2020 10:45      IMPRESSION/PLAN: This is a wonderful 65  year old woman with metastatic disease to the brain.  She received stereotactic radiosurgery to 9 lesions in the brain in 2020 and now she has a new lesion in the left temporal lobe that is consistent with a new metastasis.  I had a lengthy discussion with the patient after reviewing their MRI results with them.   I showed her the images of her brain and recommended that we target the new lesion with stereotactic radiosurgery.  She tolerated this well in the past and I expect that she will tolerate this well as a salvage treatment moving forward.  We reviewed the risks benefits and side effects of stereotactic radiosurgery to the brain which include but are not necessarily limited to fatigue, headache, and / or brain injury which can be serious.  The potential benefits of treatment outweigh the risks and she would like to proceed with stereotactic brain radiosurgery.  Consent form was signed and placed in her chart.  Proceed with CT simulation today and treatment next week.  On date of service, in total, I spent 40 minutes on this encounter. Patient was seen in person.   __________________________________________   Eppie Gibson, MD  This document serves as a record of services personally performed by Eppie Gibson, MD. It was created on her behalf by Roney Mans, a trained  medical scribe. The creation of this record is based on the scribe's personal observations and the provider's statements to them. This document has been checked and approved by the attending provider.

## 2020-08-27 ENCOUNTER — Inpatient Hospital Stay: Payer: Medicare Other

## 2020-08-27 ENCOUNTER — Encounter: Payer: Self-pay | Admitting: Internal Medicine

## 2020-08-27 ENCOUNTER — Other Ambulatory Visit: Payer: Self-pay

## 2020-08-27 ENCOUNTER — Inpatient Hospital Stay (HOSPITAL_BASED_OUTPATIENT_CLINIC_OR_DEPARTMENT_OTHER): Payer: Medicare Other | Admitting: Internal Medicine

## 2020-08-27 VITALS — BP 130/57 | HR 60 | Temp 99.0°F | Resp 20 | Ht 61.0 in | Wt 178.7 lb

## 2020-08-27 DIAGNOSIS — Z5112 Encounter for antineoplastic immunotherapy: Secondary | ICD-10-CM | POA: Diagnosis not present

## 2020-08-27 DIAGNOSIS — C3412 Malignant neoplasm of upper lobe, left bronchus or lung: Secondary | ICD-10-CM | POA: Diagnosis not present

## 2020-08-27 DIAGNOSIS — Z95828 Presence of other vascular implants and grafts: Secondary | ICD-10-CM

## 2020-08-27 DIAGNOSIS — C7931 Secondary malignant neoplasm of brain: Secondary | ICD-10-CM | POA: Diagnosis not present

## 2020-08-27 DIAGNOSIS — D649 Anemia, unspecified: Secondary | ICD-10-CM | POA: Diagnosis not present

## 2020-08-27 DIAGNOSIS — N289 Disorder of kidney and ureter, unspecified: Secondary | ICD-10-CM | POA: Diagnosis not present

## 2020-08-27 DIAGNOSIS — C3491 Malignant neoplasm of unspecified part of right bronchus or lung: Secondary | ICD-10-CM

## 2020-08-27 DIAGNOSIS — R519 Headache, unspecified: Secondary | ICD-10-CM | POA: Diagnosis not present

## 2020-08-27 DIAGNOSIS — C3492 Malignant neoplasm of unspecified part of left bronchus or lung: Secondary | ICD-10-CM

## 2020-08-27 DIAGNOSIS — R5383 Other fatigue: Secondary | ICD-10-CM

## 2020-08-27 LAB — CBC WITH DIFFERENTIAL (CANCER CENTER ONLY)
Abs Immature Granulocytes: 0.01 10*3/uL (ref 0.00–0.07)
Basophils Absolute: 0.1 10*3/uL (ref 0.0–0.1)
Basophils Relative: 1 %
Eosinophils Absolute: 0.5 10*3/uL (ref 0.0–0.5)
Eosinophils Relative: 8 %
HCT: 36.1 % (ref 36.0–46.0)
Hemoglobin: 10.9 g/dL — ABNORMAL LOW (ref 12.0–15.0)
Immature Granulocytes: 0 %
Lymphocytes Relative: 21 %
Lymphs Abs: 1.5 10*3/uL (ref 0.7–4.0)
MCH: 24.3 pg — ABNORMAL LOW (ref 26.0–34.0)
MCHC: 30.2 g/dL (ref 30.0–36.0)
MCV: 80.6 fL (ref 80.0–100.0)
Monocytes Absolute: 0.6 10*3/uL (ref 0.1–1.0)
Monocytes Relative: 9 %
Neutro Abs: 4.3 10*3/uL (ref 1.7–7.7)
Neutrophils Relative %: 61 %
Platelet Count: 269 10*3/uL (ref 150–400)
RBC: 4.48 MIL/uL (ref 3.87–5.11)
RDW: 16.4 % — ABNORMAL HIGH (ref 11.5–15.5)
WBC Count: 7 10*3/uL (ref 4.0–10.5)
nRBC: 0 % (ref 0.0–0.2)

## 2020-08-27 LAB — CMP (CANCER CENTER ONLY)
ALT: 13 U/L (ref 0–44)
AST: 20 U/L (ref 15–41)
Albumin: 3.5 g/dL (ref 3.5–5.0)
Alkaline Phosphatase: 78 U/L (ref 38–126)
Anion gap: 10 (ref 5–15)
BUN: 17 mg/dL (ref 8–23)
CO2: 27 mmol/L (ref 22–32)
Calcium: 9.5 mg/dL (ref 8.9–10.3)
Chloride: 104 mmol/L (ref 98–111)
Creatinine: 1.77 mg/dL — ABNORMAL HIGH (ref 0.44–1.00)
GFR, Estimated: 31 mL/min — ABNORMAL LOW (ref >60)
Glucose, Bld: 140 mg/dL — ABNORMAL HIGH (ref 70–99)
Potassium: 4.3 mmol/L (ref 3.5–5.1)
Sodium: 141 mmol/L (ref 135–145)
Total Bilirubin: 0.3 mg/dL (ref 0.3–1.2)
Total Protein: 7.4 g/dL (ref 6.5–8.1)

## 2020-08-27 LAB — TSH: TSH: 2.087 u[IU]/mL (ref 0.308–3.960)

## 2020-08-27 MED ORDER — SODIUM CHLORIDE 0.9 % IV SOLN
Freq: Once | INTRAVENOUS | Status: AC
  Filled 2020-08-27: qty 250

## 2020-08-27 MED ORDER — SODIUM CHLORIDE 0.9 % IV SOLN
200.0000 mg | Freq: Once | INTRAVENOUS | Status: AC
  Administered 2020-08-27: 200 mg via INTRAVENOUS
  Filled 2020-08-27: qty 8

## 2020-08-27 MED ORDER — SODIUM CHLORIDE 0.9% FLUSH
10.0000 mL | INTRAVENOUS | Status: DC | PRN
  Administered 2020-08-27: 10 mL
  Filled 2020-08-27: qty 10

## 2020-08-27 MED ORDER — HEPARIN SOD (PORK) LOCK FLUSH 100 UNIT/ML IV SOLN
500.0000 [IU] | Freq: Once | INTRAVENOUS | Status: AC | PRN
  Administered 2020-08-27: 500 [IU]
  Filled 2020-08-27: qty 5

## 2020-08-27 NOTE — Patient Instructions (Signed)
[facility] MEDICAL [facility]COLOG[facility]  Discharge Instructions: ?Thank you for choosing [facility] to provide your oncology and hematology care.  ? ?If you have a lab appointment with the Cancer Center, please go directly to the Cancer Center and check in at the registration area. ?  ?Wear comfortable clothing and clothing appropriate for easy access to any Portacath or PICC line.  ? ?We strive to give you quality time with your provider. You may need to reschedule your appointment if you arrive late (15 or more minutes).  Arriving late affects you and other patients whose appointments are after yours.  Also, if you miss three or more appointments without notifying the office, you may be dismissed from the clinic at the provider?s discretion.    ?  ?For prescription refill requests, have your pharmacy contact our office and allow 72 hours for refills to be completed.   ? ?Today you received the following chemotherapy and/or immunotherapy agents: Keytruda ?  ?To help prevent nausea and vomiting after your treatment, we encourage you to take your nausea medication as directed. ? ?BELOW ARE SYMPTOMS THAT SHOULD BE REPORTED IMMEDIATELY: ?*FEVER GREATER THAN 100.4 F (38 ?C) OR HIGHER ?*CHILLS OR SWEATING ?*NAUSEA AND VOMITING THAT IS NOT CONTROLLED WITH YOUR NAUSEA MEDICATION ?*UNUSUAL SHORTNESS OF BREATH ?*UNUSUAL BRUISING OR BLEEDING ?*URINARY PROBLEMS (pain or burning when urinating, or frequent urination) ?*BOWEL PROBLEMS (unusual diarrhea, constipation, pain near the anus) ?TENDERNESS IN MOUTH AND THROAT WITH OR WITHOUT PRESENCE OF ULCERS (sore throat, sores in mouth, or a toothache) ?UNUSUAL RASH, SWELLING OR PAIN  ?UNUSUAL VAGINAL DISCHARGE OR ITCHING  ? ?Items with * indicate a potential emergency and should be followed up as soon as possible or go to the Emergency Department if any problems should occur. ? ?Please show the CHEMOTHERAPY ALERT CARD or IMMUNOTHERAPY ALERT CARD at check-in to the  Emergency Department and triage nurse. ? ?Should you have questions after your visit or need to cancel or reschedule your appointment, please contact [facility]  Dept: [phone]  and follow the prompts.  Office hours are 8:00 a.m. to 4:30 p.m. Monday - Friday. Please note that voicemails left after 4:00 p.m. may not be returned until the following business day.  We are closed weekends and major holidays. You have access to a nurse at all times for urgent questions. Please call the main number to the clinic Dept: [phone] and follow the prompts. ? ? ?For any non-urgent questions, you may also contact your provider using MyChart. We now offer e-Visits for anyone 18 and older to request care online for non-urgent symptoms. For details visit [email or web address]. ?  ?Also download the MyChart app! Go to the app store, search "MyChart", open the app, select , and log in with your MyChart username and password. ? ?Due to Covid, a mask is required upon entering the hospital/clinic. If you do not have a mask, one will be given to you upon arrival. For doctor visits, patients may have 1 support person aged 18 or older with them. For treatment visits, patients cannot have anyone with them due to current Covid guidelines and our immunocompromised population.  ? ?

## 2020-08-27 NOTE — Progress Notes (Signed)
[place] Telephone:([phone]) [phone]   Fax:([phone]  OFFICE PROGRESS NOTE  [name], [place] [address]  DIAGNOSIS: Stage IV (T1c, N1, M1c ) non-small cell lung cancer, adenocarcinoma diagnosed in July 2020 and presented with left upper lobe lung nodule in addition to right upper lobe lung nodule with left hilar lymphadenopathy as well as multiple brain metastasis.  Biomarker Findings Microsatellite status - MS-Stable Tumor Mutational Burden - 4 Muts/Mb Genomic Findings For a complete list of the genes assayed, please refer to the Appendix. ATM G204* KRAS G12C PDGFRA P122T SMO R199W 7 Disease relevant genes with no reportable alterations: ALK, BRAF, EGFR, ERBB2, MET, RET, ROS1  PDL 1 expression was 1%  PRIOR THERAPY: SBRT to multiple brain metastasis under the care of Dr. Isidore Moos.  CURRENT THERAPY: Systemic chemotherapy with carboplatin for AUC of 5, Alimta 500 mg/M2 and Keytruda 200 mg IV every 3 weeks.  First dose October 05, 2018.  Status post 32 cycles.  Starting from cycle #5 the patient will be on treatment with maintenance Alimta 500 mg/M2 and Keytruda 200 mg IV every 3 weeks.  She has been on treatment with single agent Keytruda recently secondary to renal insufficiency.  INTERVAL HISTORY: Carolyn [name] 65 y.o. female returns to the clinic today for follow-up visit.  The patient is feeling fine today with no concerning complaints except for mild fatigue.  She denied having any current chest pain, shortness of breath, cough or hemoptysis.  She denied having any fever or chills.  She has no nausea, vomiting, diarrhea or constipation.  She has no headache or visual changes.  She is celebrating her 79 birthday tomorrow.  The patient is here today for evaluation before starting cycle #34.  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  MEDICATIONS:  Current Outpatient Medications  Medication Sig Dispense Refill   acetaminophen (TYLENOL) 500 MG tablet Take 1,000 mg by mouth every 8 (eight) hours as needed for mild pain, fever or headache.      amLODipine (NORVASC) 10 MG tablet Take 1 tablet (10 mg total) by mouth daily. 90 tablet 3   aspirin EC 81 MG tablet Take 81 mg by mouth daily.     azelastine (ASTELIN) 0.1 % nasal spray 1 spray ea nostril     carvedilol (COREG) 25 MG tablet Take 1/2 (one-half) tablet by mouth twice daily 180 tablet 0   Cholecalciferol (VITAMIN D-3) 125 MCG (5000 UT) TABS Take 5,000 Units by mouth daily.     Chromium Picolinate 1000 MCG TABS Take 1,000 mg by mouth daily.     empagliflozin (JARDIANCE)  25 MG TABS tablet Take 1 tablet (25 mg total) by mouth daily before breakfast. (Patient taking differently: Take 25 mg by mouth at bedtime.) 30 tablet 3   esomeprazole (NEXIUM) 20 MG capsule Take 1 capsule by mouth daily.     ezetimibe (ZETIA) 10 MG tablet TAKE 1 TABLET BY MOUTH ONCE DAILY AFTER SUPPER 30 tablet 0   fexofenadine (ALLEGRA) 180 MG tablet 1 tablet swallow whole with water; do not take with fruit juices.     Lancets (ONETOUCH DELICA PLUS [id number]) MISC SMARTSIG:1 Topical 3 Times Daily     lidocaine-prilocaine (EMLA) cream Apply to the Port-A-Cath site 30 to 60 minutes before chemotherapy. 30 g 2   metFORMIN (GLUCOPHAGE) 500 MG tablet Take 500 mg by mouth daily after  breakfast.     methocarbamol (ROBAXIN) 500 MG tablet Take 1 tablet (500 mg total) by mouth every 6 (six) hours as needed for muscle spasms. (Patient not taking: Reported on 08/21/2020) 21 tablet 0   nitroGLYCERIN (NITROSTAT) 0.4 MG SL tablet Place 1 tablet (0.4 mg total) under the tongue every 5 (five) minutes as needed for chest pain. 25 tablet 2   ONETOUCH ULTRA test strip USE 1 STRIP TO CHECK GLUCOSE THREE TIMES DAILY     Pitavastatin Calcium (LIVALO) 4 MG TABS Take 4 mg by mouth at bedtime.      telmisartan (MICARDIS) 40 MG tablet Take 1 tablet by mouth daily.     No current facility-administered medications for this visit.   Facility-Administered Medications Ordered in Other Visits  Medication Dose Route Frequency Provider Last Rate Last Admin   sodium chloride flush (NS) 0.9 % injection 10 mL  10 mL Intracatheter PRN Curt Bears, MD   10 mL at 08/27/20 0743    SURGICAL HISTORY:  Past Surgical History:  Procedure Laterality Date   ABDOMINAL HYSTERECTOMY     bone spur     CARDIAC CATHETERIZATION  06/13/2012   carpel tunnel surgery Left    COLONOSCOPY     9 years ago   CORONARY ANGIOPLASTY  06/13/2012   EYE SURGERY     cyst left eye    IR IMAGING GUIDED PORT INSERTION  10/10/2018   LEFT HEART CATHETERIZATION WITH CORONARY ANGIOGRAM N/A 06/13/2012   Procedure: LEFT HEART CATHETERIZATION WITH CORONARY ANGIOGRAM;  Surgeon: Laverda Page, MD;  Location: [facility] CATH LAB;  Service: Cardiovascular;  Laterality: N/A;   NECK SURGERY     rotator cuff surgery Right 2015   VIDEO BRONCHOSCOPY WITH ENDOBRONCHIAL NAVIGATION N/A 09/06/2018   Procedure: VIDEO BRONCHOSCOPY WITH ENDOBRONCHIAL NAVIGATION, left upper lung;  Surgeon: Lajuana Matte, MD;  Location: [facility];  Service: Thoracic;  Laterality: N/A;   VIDEO BRONCHOSCOPY WITH ENDOBRONCHIAL ULTRASOUND Left 09/06/2018   Procedure: VIDEO BRONCHOSCOPY WITH ENDOBRONCHIAL ULTRASOUND, left lung;  Surgeon: Lajuana Matte, MD;  Location: [facility];   Service: Thoracic;  Laterality: Left;   WISDOM TOOTH EXTRACTION      REVIEW OF SYSTEMS:  A comprehensive review of systems was negative except for: Constitutional: positive for fatigue   PHYSICAL EXAMINATION: General appearance: alert, cooperative, fatigued, and no distress Head: Normocephalic, without obvious abnormality, atraumatic Neck: no adenopathy, no JVD, supple, symmetrical, trachea midline, and thyroid not enlarged, symmetric, no tenderness/mass/nodules Lymph nodes: Cervical, supraclavicular, and axillary nodes normal. Resp: clear to auscultation bilaterally Back: symmetric, no curvature. ROM normal. No CVA tenderness. Cardio: regular rate and rhythm, S1, S2 normal, no murmur, click, rub or gallop GI: soft, non-tender; bowel sounds normal; no  masses,  no organomegaly Extremities: extremities normal, atraumatic, no cyanosis or edema  ECOG PERFORMANCE STATUS: 1 - Symptomatic but completely ambulatory  Blood pressure (!) 130/57, pulse 60, temperature 99 F (37.2 C), temperature source Tympanic, resp. rate 20, height _0  (1.549 m), weight 178 lb 11.2 oz (81.1 kg), SpO2 99 %.  LABORATORY DATA: Lab Results  Component Value Date   WBC 7.0 08/27/2020   HGB 10.9 (L) 08/27/2020   HCT 36.1 08/27/2020   MCV 80.6 08/27/2020   PLT 269 08/27/2020      Chemistry      Component Value Date/Time   NA 139 08/18/2020 0801   K 4.7 08/18/2020 0801   CL 102 08/18/2020 0801   CO2 24 08/18/2020 0801   BUN 16 08/18/2020 0801   CREATININE 1.66 (H) 08/18/2020 0801   CREATININE 1.96 (H) 08/07/2020 0722      Component Value Date/Time   CALCIUM 9.6 08/18/2020 0801   ALKPHOS 76 08/07/2020 0722   AST 18 08/07/2020 0722   ALT 10 08/07/2020 0722   BILITOT 0.3 08/07/2020 0722       RADIOGRAPHIC STUDIES: MR BRAIN W WO CONTRAST  Result Date: 08/16/2020 CLINICAL DATA:  Brain/CNS neoplasm, assess treatment response. EXAM: MRI HEAD WITHOUT AND WITH CONTRAST TECHNIQUE: Multiplanar, multiecho  pulse sequences of the brain and surrounding structures were obtained without and with intravenous contrast. CONTRAST:  46m MULTIHANCE GADOBENATE DIMEGLUMINE 529 MG/ML IV SOLN COMPARISON:  MRI 02/14/2020. FINDINGS: Brain: No acute infarction, hemorrhage, hydrocephalus, or extra-axial fluid collection. Unchanged 2 mm focus of enhancement in the inferolateral left cerebellum (series 12, image 26). New 4 mm focus of enhancement in the anterior left temporal lobe (see series 11, image 69). There are a few scattered T2/FLAIR hyperintensities within the white matter, which are similar to prior. Similar encephalomalacia in the inferior left greater than right cerebellum. Vascular: Major arterial flow voids are maintained at the skull base. Skull and upper cervical spine: Normal marrow signal. Partially imaged cervical ACDF. Sinuses/Orbits: Mild ethmoid air cell mucosal thickening without air-fluid levels. Unremarkable orbits. Other: No mastoid effusions. IMPRESSION: 1. New 4 mm focus of enhancement in the anterior left temporal lobe (see series 11, image 69), concerning for a new metastasis. 2. Unchanged 2 mm focus of enhancement in the inferolateral left cerebellum, stable over multiple priors. These results will be called to the ordering clinician or representative by the Radiologist Assistant, and communication documented in the PACS or [employer] Electronically Signed   By: FMargaretha SheffieldMD   On: 08/16/2020 10:45     ASSESSMENT AND PLAN: This is a very pleasant 65years old African-American female with likely stage IV (T1c, N1, M1C) non-small cell lung cancer, adenocarcinoma with K-ras G12C mutation diagnosed in July 2020 and presented with left upper lobe lung nodule in addition to right upper lobe pulmonary nodule as well as left hilar adenopathy and metastatic lesion to the brain. Molecular studies showed no actionable mutation and PDL 1 expression was 1%. The patient underwent SBRT to the  metastatic brain lesion under the care of Dr. SIsidore Moos The patient is currently undergoing systemic chemotherapy with carboplatin for AUC of 5, Alimta 500 mg/M2 and Keytruda 200 mg IV every 3 weeks is status post 33 cycles.  Starting from cycle #5 she is on maintenance treatment with Alimta and Keytruda every 3 weeks with only Keytruda on the last few cycles because of the renal insufficiency. The patient continues to tolerate her treatment fairly well with no  concerning adverse effects. I recommended for her to proceed with cycle #34 today as planned. I will see her back for follow-up visit in 3 weeks for evaluation before starting cycle #34.  I will consider repeating her imaging studies after the next cycle of her treatment. For the renal insufficiency, we will continue to monitor her renal function closely. For the anemia, the patient will continue on oral iron tablets. The patient was advised to call immediately if she has any other concerning symptoms in the interval. The patient voices understanding of current disease status and treatment options and is in agreement with the current care plan. All questions were answered. The patient knows to call the clinic with any problems, questions or concerns. We can certainly see the patient much sooner if necessary.  Disclaimer: This note was dictated with voice recognition software. Similar sounding words can inadvertently be transcribed and may not be corrected upon review.

## 2020-08-27 NOTE — Progress Notes (Signed)
SCr 1.77, renal insufficiency addressed and being monitored per MD note.

## 2020-08-29 ENCOUNTER — Ambulatory Visit: Payer: Medicare Other | Admitting: Radiation Oncology

## 2020-08-29 DIAGNOSIS — C801 Malignant (primary) neoplasm, unspecified: Secondary | ICD-10-CM | POA: Diagnosis not present

## 2020-08-29 DIAGNOSIS — C7931 Secondary malignant neoplasm of brain: Secondary | ICD-10-CM | POA: Diagnosis not present

## 2020-08-29 DIAGNOSIS — Z510 Encounter for antineoplastic radiation therapy: Secondary | ICD-10-CM | POA: Diagnosis not present

## 2020-09-01 ENCOUNTER — Other Ambulatory Visit: Payer: Self-pay

## 2020-09-01 ENCOUNTER — Ambulatory Visit: Payer: Medicare Other | Admitting: Radiation Oncology

## 2020-09-01 DIAGNOSIS — E1122 Type 2 diabetes mellitus with diabetic chronic kidney disease: Secondary | ICD-10-CM

## 2020-09-01 DIAGNOSIS — N1832 Chronic kidney disease, stage 3b: Secondary | ICD-10-CM

## 2020-09-01 MED ORDER — EMPAGLIFLOZIN 25 MG PO TABS: 25.0000 mg | ORAL_TABLET | Freq: Every day | ORAL | 3 refills | Status: DC

## 2020-09-02 ENCOUNTER — Ambulatory Visit
Admission: RE | Admit: 2020-09-02 | Discharge: 2020-09-02 | Disposition: A | Discharge status: Home / Self Care | Payer: Medicare Other | Source: Ambulatory Visit | Attending: Radiation Oncology | Admitting: Radiation Oncology

## 2020-09-02 ENCOUNTER — Encounter: Payer: Self-pay | Admitting: Radiation Oncology

## 2020-09-02 DIAGNOSIS — C7931 Secondary malignant neoplasm of brain: Secondary | ICD-10-CM

## 2020-09-02 DIAGNOSIS — Z510 Encounter for antineoplastic radiation therapy: Secondary | ICD-10-CM | POA: Diagnosis not present

## 2020-09-02 DIAGNOSIS — C801 Malignant (primary) neoplasm, unspecified: Secondary | ICD-10-CM | POA: Diagnosis not present

## 2020-09-04 NOTE — Procedures (Signed)
  Name: Carolyn [name]  MRN: [id number]  Date: 09/02/2020   DOB: August 27, 1955  Stereotactic Radiosurgery Operative Note  PRE-OPERATIVE DIAGNOSIS:  Solitary Brain Metastasis  POST-OPERATIVE DIAGNOSIS:  Solitary Brain Metastasis  PROCEDURE:  Stereotactic Radiosurgery  SURGEON:  Elaina Hoops, MD  NARRATIVE: The patient underwent a radiation treatment planning session in the radiation oncology simulation suite under the care of the radiation oncology physician and physicist.  I participated closely in the radiation treatment planning afterwards. The patient underwent planning CT which was fused to 3T high resolution MRI with 1 mm axial slices.  These images were fused on the planning system.  We contoured the gross target volumes and subsequently expanded this to yield the Planning Target Volume. I actively participated in the planning process.  I helped to define and review the target contours and also the contours of the optic pathway, eyes, brainstem and selected nearby organs at risk.  All the dose constraints for critical structures were reviewed and compared to AAPM Task Group 101.  The prescription dose conformity was reviewed.  I approved the plan electronically.    Accordingly, [name] was brought to the TrueBeam stereotactic radiation treatment linac and placed in the custom immobilization mask.  The patient was aligned according to the IR fiducial markers with BrainLab Exactrac, then orthogonal x-rays were used in ExacTrac with the 6DOF robotic table and the shifts were made to align the patient  [name] received stereotactic radiosurgery uneventfully.    The detailed description of the procedure is recorded in the radiation oncology procedure note.  I was present for the duration of the procedure.  DISPOSITION:  Following delivery, the patient was transported to nursing in stable condition and monitored for possible acute effects to be discharged to home in stable  condition with follow-up in one month.  Elaina Hoops, MD 09/04/2020 2:36 PM

## 2020-09-05 NOTE — Progress Notes (Signed)
  Radiation Oncology         [phone] ________________________________  Name: CAMBREE HENDRIX MRN: [id number]  Date: 09/02/2020  DOB: November 18, 1955  Stereotactic Treatment Procedure Note  SPECIAL TREATMENT PROCEDURE  Outpatient    ICD-10-CM   1. Brain metastases ([place])  C79.31       3D TREATMENT PLANNING AND DOSIMETRY:  The patient's radiation plan was reviewed and approved by neurosurgery and radiation oncology prior to treatment.  It showed 3-dimensional radiation distributions overlaid onto the planning CT/MRI image set.  The [facility] for the target structures as well as the organs at risk were reviewed. The documentation of the 3D plan and dosimetry are filed in the radiation oncology EMR.  NARRATIVE:  ARAYA ROEL was brought to the TrueBeam stereotactic radiation treatment machine and placed supine on the CT couch. The head frame was applied, and the patient was set up for stereotactic radiosurgery.  Neurosurgery was present for the set-up and delivery  SIMULATION VERIFICATION:  In the couch zero-angle position, the patient underwent Exactrac imaging using the Brainlab system with orthogonal KV images.  These were carefully aligned and repeated to confirm treatment position for each of the isocenters.  The Exactrac snap film verification was repeated at each couch angle.  SPECIAL TREATMENT PROCEDURE: [name] received stereotactic radiosurgery to the following targets: Left temporal 4 mm metastasis was treated using stereotactic radiosurgery technique, 3D, to a dose of 20 Gray in 1 fraction. ExacTrac Snap verification was performed for each couch angle.  This constitutes a special treatment procedure due to the ablative dose delivered and the technical nature of treatment.  This highly technical modality of treatment ensures that the ablative dose is centered on the patient's tumor while sparing normal tissues from excessive dose and risk of detrimental  effects.  STEREOTACTIC TREATMENT MANAGEMENT:  Following delivery, the patient was deemed in stable condition and she denied any acute effects.  She was discharged home taking special precautions by escorting her through the back entrance, given that she had a positive COVID test this morning.  PLAN: Follow-up in one month.  ________________________________   Eppie [place], MD

## 2020-09-15 ENCOUNTER — Ambulatory Visit: Admitting: Cardiology

## 2020-09-18 ENCOUNTER — Inpatient Hospital Stay: Payer: Medicare Other

## 2020-09-18 ENCOUNTER — Other Ambulatory Visit: Payer: Self-pay

## 2020-09-18 ENCOUNTER — Other Ambulatory Visit: Payer: Self-pay | Admitting: Radiation Therapy

## 2020-09-18 ENCOUNTER — Inpatient Hospital Stay: Payer: Medicare Other | Attending: Physician Assistant | Admitting: Internal Medicine

## 2020-09-18 VITALS — BP 135/61 | HR 58 | Temp 96.6°F | Resp 20 | Ht 61.0 in | Wt 178.2 lb

## 2020-09-18 DIAGNOSIS — Z79899 Other long term (current) drug therapy: Secondary | ICD-10-CM | POA: Insufficient documentation

## 2020-09-18 DIAGNOSIS — C7931 Secondary malignant neoplasm of brain: Secondary | ICD-10-CM

## 2020-09-18 DIAGNOSIS — Z5112 Encounter for antineoplastic immunotherapy: Secondary | ICD-10-CM

## 2020-09-18 DIAGNOSIS — C3492 Malignant neoplasm of unspecified part of left bronchus or lung: Secondary | ICD-10-CM

## 2020-09-18 DIAGNOSIS — R5383 Other fatigue: Secondary | ICD-10-CM

## 2020-09-18 DIAGNOSIS — C3491 Malignant neoplasm of unspecified part of right bronchus or lung: Secondary | ICD-10-CM

## 2020-09-18 DIAGNOSIS — C3490 Malignant neoplasm of unspecified part of unspecified bronchus or lung: Secondary | ICD-10-CM

## 2020-09-18 DIAGNOSIS — Z95828 Presence of other vascular implants and grafts: Secondary | ICD-10-CM

## 2020-09-18 DIAGNOSIS — C3412 Malignant neoplasm of upper lobe, left bronchus or lung: Secondary | ICD-10-CM | POA: Diagnosis not present

## 2020-09-18 LAB — CMP (CANCER CENTER ONLY)
ALT: 14 U/L (ref 0–44)
AST: 20 U/L (ref 15–41)
Albumin: 3.5 g/dL (ref 3.5–5.0)
Alkaline Phosphatase: 78 U/L (ref 38–126)
Anion gap: 10 (ref 5–15)
BUN: 19 mg/dL (ref 8–23)
CO2: 24 mmol/L (ref 22–32)
Calcium: 9.3 mg/dL (ref 8.9–10.3)
Chloride: 108 mmol/L (ref 98–111)
Creatinine: 1.9 mg/dL — ABNORMAL HIGH (ref 0.44–1.00)
GFR, Estimated: 29 mL/min — ABNORMAL LOW (ref >60)
Glucose, Bld: 102 mg/dL — ABNORMAL HIGH (ref 70–99)
Potassium: 4.2 mmol/L (ref 3.5–5.1)
Sodium: 142 mmol/L (ref 135–145)
Total Bilirubin: 0.3 mg/dL (ref 0.3–1.2)
Total Protein: 7.2 g/dL (ref 6.5–8.1)

## 2020-09-18 LAB — CBC WITH DIFFERENTIAL (CANCER CENTER ONLY)
Abs Immature Granulocytes: 0.02 10*3/uL (ref 0.00–0.07)
Basophils Absolute: 0 10*3/uL (ref 0.0–0.1)
Basophils Relative: 1 %
Eosinophils Absolute: 0.5 10*3/uL (ref 0.0–0.5)
Eosinophils Relative: 7 %
HCT: 35.4 % — ABNORMAL LOW (ref 36.0–46.0)
Hemoglobin: 10.9 g/dL — ABNORMAL LOW (ref 12.0–15.0)
Immature Granulocytes: 0 %
Lymphocytes Relative: 21 %
Lymphs Abs: 1.5 10*3/uL (ref 0.7–4.0)
MCH: 24.5 pg — ABNORMAL LOW (ref 26.0–34.0)
MCHC: 30.8 g/dL (ref 30.0–36.0)
MCV: 79.6 fL — ABNORMAL LOW (ref 80.0–100.0)
Monocytes Absolute: 0.7 10*3/uL (ref 0.1–1.0)
Monocytes Relative: 10 %
Neutro Abs: 4.2 10*3/uL (ref 1.7–7.7)
Neutrophils Relative %: 61 %
Platelet Count: 280 10*3/uL (ref 150–400)
RBC: 4.45 MIL/uL (ref 3.87–5.11)
RDW: 16.4 % — ABNORMAL HIGH (ref 11.5–15.5)
WBC Count: 6.8 10*3/uL (ref 4.0–10.5)
nRBC: 0 % (ref 0.0–0.2)

## 2020-09-18 LAB — TSH: TSH: 2.626 u[IU]/mL (ref 0.308–3.960)

## 2020-09-18 MED ORDER — SODIUM CHLORIDE 0.9% FLUSH
10.0000 mL | INTRAVENOUS | Status: DC | PRN
  Administered 2020-09-18: 10 mL
  Filled 2020-09-18: qty 10

## 2020-09-18 MED ORDER — SODIUM CHLORIDE 0.9 % IV SOLN
Freq: Once | INTRAVENOUS | Status: AC
  Filled 2020-09-18: qty 250

## 2020-09-18 MED ORDER — HEPARIN SOD (PORK) LOCK FLUSH 100 UNIT/ML IV SOLN
500.0000 [IU] | Freq: Once | INTRAVENOUS | Status: AC | PRN
  Administered 2020-09-18: 500 [IU]
  Filled 2020-09-18: qty 5

## 2020-09-18 MED ORDER — SODIUM CHLORIDE 0.9 % IV SOLN
200.0000 mg | Freq: Once | INTRAVENOUS | Status: AC
  Administered 2020-09-18: 200 mg via INTRAVENOUS
  Filled 2020-09-18: qty 8

## 2020-09-18 NOTE — Patient Instructions (Signed)
[facility]  Discharge Instructions: ?Thank you for choosing [facility] to provide your oncology and hematology care.  ? ?If you have a lab appointment with the Cancer Center, please go directly to the Cancer Center and check in at the registration area. ?  ?Wear comfortable clothing and clothing appropriate for easy access to any Portacath or PICC line.  ? ?We strive to give you quality time with your provider. You may need to reschedule your appointment if you arrive late (15 or more minutes).  Arriving late affects you and other patients whose appointments are after yours.  Also, if you miss three or more appointments without notifying the office, you may be dismissed from the clinic at the provider?s discretion.    ?  ?For prescription refill requests, have your pharmacy contact our office and allow 72 hours for refills to be completed.   ? ?Today you received the following chemotherapy and/or immunotherapy agents: Keytruda ?  ?To help prevent nausea and vomiting after your treatment, we encourage you to take your nausea medication as directed. ? ?BELOW ARE SYMPTOMS THAT SHOULD BE REPORTED IMMEDIATELY: ?*FEVER GREATER THAN 100.4 F (38 ?C) OR HIGHER ?*CHILLS OR SWEATING ?*NAUSEA AND VOMITING THAT IS NOT CONTROLLED WITH YOUR NAUSEA MEDICATION ?*UNUSUAL SHORTNESS OF BREATH ?*UNUSUAL BRUISING OR BLEEDING ?*URINARY PROBLEMS (pain or burning when urinating, or frequent urination) ?*BOWEL PROBLEMS (unusual diarrhea, constipation, pain near the anus) ?TENDERNESS IN MOUTH AND THROAT WITH OR WITHOUT PRESENCE OF ULCERS (sore throat, sores in mouth, or a toothache) ?UNUSUAL RASH, SWELLING OR PAIN  ?UNUSUAL VAGINAL DISCHARGE OR ITCHING  ? ?Items with * indicate a potential emergency and should be followed up as soon as possible or go to the Emergency Department if any problems should occur. ? ?Please show the CHEMOTHERAPY ALERT CARD or IMMUNOTHERAPY ALERT CARD at check-in to the  Emergency Department and triage nurse. ? ?Should you have questions after your visit or need to cancel or reschedule your appointment, please contact [facility]  Dept: [phone]  and follow the prompts.  Office hours are 8:00 a.m. to 4:30 p.m. Monday - Friday. Please note that voicemails left after 4:00 p.m. may not be returned until the following business day.  We are closed weekends and major holidays. You have access to a nurse at all times for urgent questions. Please call the main number to the clinic Dept: [phone] and follow the prompts. ? ? ?For any non-urgent questions, you may also contact your provider using MyChart. We now offer e-Visits for anyone 18 and older to request care online for non-urgent symptoms. For details visit [email or web address]. ?  ?Also download the MyChart app! Go to the app store, search "MyChart", open the app, select , and log in with your MyChart username and password. ? ?Due to Covid, a mask is required upon entering the hospital/clinic. If you do not have a mask, one will be given to you upon arrival. For doctor visits, patients may have 1 support person aged 18 or older with them. For treatment visits, patients cannot have anyone with them due to current Covid guidelines and our immunocompromised population.  ? ?

## 2020-09-18 NOTE — Progress Notes (Signed)
[place] [phone]   [phone]  OFFICE PROGRESS NOTE  [name], [place] [address]  DIAGNOSIS: Stage IV (T1c, N1, M1c ) non-small cell lung cancer, adenocarcinoma diagnosed in July 2020 and presented with left upper lobe lung nodule in addition to right upper lobe lung nodule with left hilar lymphadenopathy as well as multiple brain metastasis.  Biomarker Findings Microsatellite status - MS-Stable Tumor Mutational Burden - 4 Muts/Mb Genomic Findings For a complete list of the genes assayed, please refer to the Appendix. ATM G204* KRAS G12C PDGFRA P122T SMO R199W 7 Disease relevant genes with no reportable alterations: ALK, BRAF, EGFR, ERBB2, MET, RET, ROS1  PDL 1 expression was 1%  PRIOR THERAPY:  1) SRS to multiple brain metastasis under the care of Dr. Isidore Moos. 2) SRS to a 4 mm new brain metastasis under the care of Dr. Isidore Moos and Dr. Saintclair Halsted on 09/02/2020  CURRENT THERAPY: Systemic chemotherapy with carboplatin for AUC of 5, Alimta 500 mg/M2 and Keytruda 200 mg IV every 3 weeks.  First dose October 05, 2018.  Status post 34 cycles.  Starting from cycle #5 the patient will be on treatment with maintenance Alimta 500 mg/M2 and Keytruda 200 mg IV every 3 weeks.  She has been on treatment with single agent Keytruda recently secondary to renal insufficiency.  INTERVAL HISTORY: Carolyn [name] 65 y.o. female returns to the clinic today for follow-up visit.  The patient is feeling fine with no concerning complaints.  She underwent SRS treatment to new brain metastasis under the care of Dr. Isidore Moos and Dr. Saintclair Halsted.  The patient tolerated the procedure well.  She denied having any current chest pain, shortness of breath, cough or hemoptysis.  She denied having any fever or chills.  She has no nausea, vomiting, diarrhea or constipation.  She has no headache or visual changes.  She is here today for  evaluation before starting cycle #5 of her treatment.  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  MEDICATIONS:  Current Outpatient Medications  Medication Sig Dispense Refill   acetaminophen (TYLENOL) 500 MG tablet Take 1,000 mg by mouth every 8 (eight) hours as needed for mild pain, fever or headache.      amLODipine (NORVASC) 10 MG tablet Take 1 tablet (10 mg total) by mouth daily. 90 tablet 3   aspirin EC 81 MG tablet Take 81 mg by mouth daily.     azelastine (ASTELIN) 0.1 % nasal spray 1 spray ea nostril     carvedilol (COREG) 25 MG tablet Take 1/2 (one-half) tablet by mouth twice daily 180 tablet 0  Cholecalciferol (VITAMIN D-3) 125 MCG (5000 UT) TABS Take 5,000 Units by mouth daily.     Chromium Picolinate 1000 MCG TABS Take 1,000 mg by mouth daily.     empagliflozin (JARDIANCE) 25 MG TABS tablet Take 1 tablet (25 mg total) by mouth daily before breakfast. 30 tablet 3   esomeprazole (NEXIUM) 20 MG capsule Take 1 capsule by mouth daily.     ezetimibe (ZETIA) 10 MG tablet TAKE 1 TABLET BY MOUTH ONCE DAILY AFTER SUPPER 30 tablet 0   fexofenadine (ALLEGRA) 180 MG tablet 1 tablet swallow whole with water; do not take with fruit juices.     Lancets (ONETOUCH DELICA PLUS [id number]) MISC SMARTSIG:1 Topical 3 Times Daily     lidocaine-prilocaine (EMLA) cream Apply to the Port-A-Cath site 30 to  60 minutes before chemotherapy. 30 g 2   metFORMIN (GLUCOPHAGE) 500 MG tablet Take 500 mg by mouth daily after breakfast.     methocarbamol (ROBAXIN) 500 MG tablet Take 1 tablet (500 mg total) by mouth every 6 (six) hours as needed for muscle spasms. (Patient not taking: Reported on 08/21/2020) 21 tablet 0   nitroGLYCERIN (NITROSTAT) 0.4 MG SL tablet Place 1 tablet (0.4 mg total) under the tongue every 5 (five) minutes as needed for chest pain. 25 tablet 2   ONETOUCH ULTRA test strip USE 1 STRIP TO CHECK GLUCOSE THREE TIMES DAILY     Pitavastatin Calcium (LIVALO) 4 MG TABS Take 4 mg by mouth at bedtime.      telmisartan (MICARDIS) 40 MG tablet Take 1 tablet by mouth daily.     No current facility-administered medications for this visit.   Facility-Administered Medications Ordered in Other Visits  Medication Dose Route Frequency Provider Last Rate Last Admin   sodium chloride flush (NS) 0.9 % injection 10 mL  10 mL Intracatheter PRN Curt Bears, MD   10 mL at 09/18/20 2585    SURGICAL HISTORY:  Past Surgical History:  Procedure Laterality Date   ABDOMINAL HYSTERECTOMY     bone spur     CARDIAC CATHETERIZATION  06/13/2012   carpel tunnel surgery Left    COLONOSCOPY     9 years ago   CORONARY ANGIOPLASTY  06/13/2012   EYE SURGERY     cyst left eye    IR IMAGING GUIDED PORT INSERTION  10/10/2018   LEFT HEART CATHETERIZATION WITH CORONARY ANGIOGRAM N/A 06/13/2012   Procedure: LEFT HEART CATHETERIZATION WITH CORONARY ANGIOGRAM;  Surgeon: Laverda Page, MD;  Location: [facility] CATH LAB;  Service: Cardiovascular;  Laterality: N/A;   NECK SURGERY     rotator cuff surgery Right 2015   VIDEO BRONCHOSCOPY WITH ENDOBRONCHIAL NAVIGATION N/A 09/06/2018   Procedure: VIDEO BRONCHOSCOPY WITH ENDOBRONCHIAL NAVIGATION, left upper lung;  Surgeon: Lajuana Matte, MD;  Location: [facility];  Service: Thoracic;  Laterality: N/A;   VIDEO BRONCHOSCOPY WITH ENDOBRONCHIAL ULTRASOUND Left 09/06/2018   Procedure:  VIDEO BRONCHOSCOPY WITH ENDOBRONCHIAL ULTRASOUND, left lung;  Surgeon: Lajuana Matte, MD;  Location: [facility];  Service: Thoracic;  Laterality: Left;   WISDOM TOOTH EXTRACTION      REVIEW OF SYSTEMS:  A comprehensive review of systems was negative.   PHYSICAL EXAMINATION: General appearance: alert, cooperative, and no distress Head: Normocephalic, without obvious abnormality, atraumatic Neck: no adenopathy, no JVD, supple, symmetrical, trachea midline, and thyroid not enlarged, symmetric, no tenderness/mass/nodules Lymph nodes: Cervical, supraclavicular, and axillary nodes normal. Resp: clear to auscultation bilaterally Back: symmetric, no curvature. ROM normal. No CVA tenderness. Cardio: regular rate  and rhythm, S1, S2 normal, no murmur, click, rub or gallop GI: soft, non-tender; bowel sounds normal; no masses,  no organomegaly Extremities: extremities normal, atraumatic, no cyanosis or edema  ECOG PERFORMANCE STATUS: 1 - Symptomatic but completely ambulatory  Blood pressure 135/61, pulse (!) 58, temperature (!) 96.6 F (35.9 C), temperature source Tympanic, resp. rate 20, height _0  (1.549 m), weight 178 lb 3.2 oz (80.8 kg), SpO2 95 %.  LABORATORY DATA: Lab Results  Component Value Date   WBC 7.0 08/27/2020   HGB 10.9 (L) 08/27/2020   HCT 36.1 08/27/2020   MCV 80.6 08/27/2020   PLT 269 08/27/2020      Chemistry      Component Value Date/Time   NA 141 08/27/2020 0738   NA 139 08/18/2020 0801   K 4.3 08/27/2020 0738   CL 104 08/27/2020 0738   CO2 27 08/27/2020 0738   BUN 17 08/27/2020 0738   BUN 16 08/18/2020 0801   CREATININE 1.77 (H) 08/27/2020 0738      Component Value Date/Time   CALCIUM 9.5 08/27/2020 0738   ALKPHOS 78 08/27/2020 0738   AST 20 08/27/2020 0738   ALT 13 08/27/2020 0738   BILITOT 0.3 08/27/2020 0738       RADIOGRAPHIC STUDIES: No results found.   ASSESSMENT AND PLAN: This is a very pleasant 65 years old African-American female with  likely stage IV (T1c, N1, M1C) non-small cell lung cancer, adenocarcinoma with K-ras G12C mutation diagnosed in July 2020 and presented with left upper lobe lung nodule in addition to right upper lobe pulmonary nodule as well as left hilar adenopathy and metastatic lesion to the brain. Molecular studies showed no actionable mutation and PDL 1 expression was 1%. The patient underwent SBRT to the metastatic brain lesion under the care of Dr. Isidore Moos. The patient is currently undergoing systemic chemotherapy with carboplatin for AUC of 5, Alimta 500 mg/M2 and Keytruda 200 mg IV every 3 weeks is status post 34 cycles.  Starting from cycle #5 she is on maintenance treatment with Alimta and Keytruda every 3 weeks with only Keytruda on the last few cycles because of the renal insufficiency. The patient has been tolerating this treatment well with no concerning adverse effects. I recommended for her to proceed with cycle #34 today as planned. I will see her back for follow-up visit and 3 weeks for evaluation with repeat CT scan of the chest, abdomen pelvis for restaging of her disease. For the hypertension and renal insufficiency, she will continue her routine follow-up visit by her primary care physician and nephrologist. The patient was advised to call immediately if she has any concerning symptoms in the interval. The patient voices understanding of current disease status and treatment options and is in agreement with the current care plan. All questions were answered. The patient knows to call the clinic with any problems, questions or concerns. We can certainly see the patient much sooner if necessary.  Disclaimer: This note was dictated with voice recognition software. Similar sounding words can inadvertently be transcribed and may not be corrected upon review.

## 2020-09-18 NOTE — Progress Notes (Signed)
Per Dr. Julien Nordmann, ok to proceed with [facility] with creatinine 1.9.

## 2020-09-19 ENCOUNTER — Telehealth: Payer: Self-pay | Admitting: Internal Medicine

## 2020-09-19 NOTE — Telephone Encounter (Signed)
Scheduled appts per 8/11 los. Will have updated calendar printed at next visit.

## 2020-09-26 DIAGNOSIS — M79641 Pain in right hand: Secondary | ICD-10-CM | POA: Diagnosis not present

## 2020-09-26 DIAGNOSIS — R768 Other specified abnormal immunological findings in serum: Secondary | ICD-10-CM | POA: Diagnosis not present

## 2020-09-26 DIAGNOSIS — C3490 Malignant neoplasm of unspecified part of unspecified bronchus or lung: Secondary | ICD-10-CM | POA: Diagnosis not present

## 2020-09-26 DIAGNOSIS — M6530 Trigger finger, unspecified finger: Secondary | ICD-10-CM | POA: Diagnosis not present

## 2020-09-26 DIAGNOSIS — N1831 Chronic kidney disease, stage 3a: Secondary | ICD-10-CM | POA: Diagnosis not present

## 2020-09-26 DIAGNOSIS — M1990 Unspecified osteoarthritis, unspecified site: Secondary | ICD-10-CM | POA: Diagnosis not present

## 2020-09-26 DIAGNOSIS — M549 Dorsalgia, unspecified: Secondary | ICD-10-CM | POA: Diagnosis not present

## 2020-09-26 DIAGNOSIS — M2550 Pain in unspecified joint: Secondary | ICD-10-CM | POA: Diagnosis not present

## 2020-09-30 DIAGNOSIS — E785 Hyperlipidemia, unspecified: Secondary | ICD-10-CM | POA: Diagnosis not present

## 2020-09-30 DIAGNOSIS — E119 Type 2 diabetes mellitus without complications: Secondary | ICD-10-CM | POA: Diagnosis not present

## 2020-09-30 DIAGNOSIS — C3490 Malignant neoplasm of unspecified part of unspecified bronchus or lung: Secondary | ICD-10-CM | POA: Diagnosis not present

## 2020-10-02 ENCOUNTER — Other Ambulatory Visit: Payer: Self-pay

## 2020-10-02 ENCOUNTER — Encounter: Payer: Self-pay | Admitting: Cardiology

## 2020-10-02 ENCOUNTER — Ambulatory Visit: Payer: Medicare Other | Admitting: Cardiology

## 2020-10-02 VITALS — BP 138/73 | HR 62 | Temp 97.9°F | Resp 16 | Ht 61.0 in | Wt 174.8 lb

## 2020-10-02 DIAGNOSIS — I10 Essential (primary) hypertension: Secondary | ICD-10-CM

## 2020-10-02 DIAGNOSIS — N1832 Chronic kidney disease, stage 3b: Secondary | ICD-10-CM

## 2020-10-02 DIAGNOSIS — I25118 Atherosclerotic heart disease of native coronary artery with other forms of angina pectoris: Secondary | ICD-10-CM | POA: Diagnosis not present

## 2020-10-02 DIAGNOSIS — E1122 Type 2 diabetes mellitus with diabetic chronic kidney disease: Secondary | ICD-10-CM

## 2020-10-02 NOTE — Progress Notes (Signed)
Primary Physician/Referring:  [name], FNP  Patient ID: Carolyn [name], female    DOB: Apr 03, 1955, 65 y.o.   MRN: [id number]  Chief Complaint  Patient presents with   Coronary Artery Disease   Hypertension   Hyperlipidemia   Follow-up    6 weeks   HPI:    HPI: Carolyn [name]  is a 65 y.o.  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  Extremely positive attitude and has been doing well. She continues to follow with oncology.   Patient is presently doing well.  On her last office visit 6 weeks ago, I had started her on Jardiance 25 mg daily for diabetes mellitus which she is tolerating, blood pressure is improved and also she has lost about 4 pounds in weight in the last 6 weeks.   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   Hypertension    nscl ca dx'd 08/07/18   Persistent headaches    h/o   Pneumonia    hx of PNA   Shortness of breath    Vaginal dryness    h/o   Family History  Problem Relation Age of Onset   Breast cancer Other    Diabetes Mother    Glaucoma Mother    Hypertension Mother    Hypertension Father    Asthma Father    Diabetes Sister    Diabetes Brother    Hypertension Sister    Past Surgical History:  Procedure Laterality Date   ABDOMINAL HYSTERECTOMY     bone spur     CARDIAC CATHETERIZATION  06/13/2012   carpel tunnel  surgery Left    COLONOSCOPY     9 years ago   CORONARY ANGIOPLASTY  06/13/2012   EYE SURGERY     cyst left eye    IR IMAGING GUIDED PORT INSERTION  10/10/2018   LEFT HEART CATHETERIZATION WITH CORONARY ANGIOGRAM N/A 06/13/2012   Procedure: LEFT HEART CATHETERIZATION WITH CORONARY ANGIOGRAM;  Surgeon: Laverda Page, MD;  Location: [facility] CATH LAB;  Service: Cardiovascular;  Laterality: N/A;   NECK SURGERY     rotator cuff surgery Right 2015   VIDEO BRONCHOSCOPY WITH ENDOBRONCHIAL NAVIGATION N/A 09/06/2018   Procedure: VIDEO BRONCHOSCOPY WITH ENDOBRONCHIAL NAVIGATION, left upper lung;  Surgeon: Lajuana Matte, MD;  Location: [place];  Service: Thoracic;  Laterality: N/A;   VIDEO BRONCHOSCOPY WITH ENDOBRONCHIAL ULTRASOUND Left 09/06/2018   Procedure: VIDEO BRONCHOSCOPY WITH ENDOBRONCHIAL ULTRASOUND, left lung;  Surgeon: Lajuana Matte, MD;  Location: [facility] OR;  Service: Thoracic;  Laterality: Left;   WISDOM TOOTH EXTRACTION     Social History   Tobacco Use   Smoking status: Former    Packs/day: 0.50    Years: 30.00    Pack  years: 15.00    Types: Cigarettes    Quit date: 02/09/1999    Years since quitting: 21.6   Smokeless tobacco: Never  Substance Use Topics   Alcohol use: Not Currently  Marital Status: Married    ROS  Review of Systems  Cardiovascular:  Negative for chest pain, dyspnea on exertion and leg swelling.  Musculoskeletal:  Positive for arthritis.  Gastrointestinal:  Negative for melena.  Objective  Blood pressure 138/73, pulse 62, temperature 97.9 F (36.6 C), temperature source Temporal, resp. rate 16, height 5\' 1"  (1.549 m), weight 174 lb 12.8 oz (79.3 kg), SpO2 96 %. Body mass index is 33.03 kg/m.   Vitals with BMI 10/02/2020 10/02/2020 09/18/2020  Height - 5\' 1"  5\' 1"   Weight - 174 lbs 13 oz 178 lbs 3 oz  BMI - 58.52 77.82  Systolic 423 536 144  Diastolic 73 69 61  Pulse 62 67 58    Physical Exam Vitals reviewed.  Constitutional:      Comments: Moderately  built and mildly obese  HENT:     Head: Normocephalic and atraumatic.  Cardiovascular:     Rate and Rhythm: Normal rate and regular rhythm.     Pulses: Intact distal pulses.     Heart sounds: Normal heart sounds.     Comments: No JVD, No leg edema Pulmonary:     Effort: Pulmonary effort is normal. No accessory muscle usage or respiratory distress.     Breath sounds: Normal breath sounds.  Abdominal:     General: Bowel sounds are normal.     Palpations: Abdomen is soft.  Musculoskeletal:        General: Normal range of motion.     Cervical back: Normal range of motion.  Neurological:     General: No focal deficit present.     Mental Status: She is alert and oriented to person, place, and time.    Laboratory examination:   Recent Labs    10/18/19 1150 11/08/19 0900 11/08/19 0900 11/12/19 0917 11/29/19 0910 08/07/20 0722 08/18/20 0801 08/27/20 0738 09/18/20 0758  NA 141 141  --  140   < > 139 139 141 142  K 3.9 3.9  --  4.0   < > 4.2 4.7 4.3 4.2  CL 106 104  --  102   < > 105 102 104 108  CO2 27 29  --  28   < > 25 24 27 24   GLUCOSE 102* 131*  --  99   < > 173* 105* 140* 102*  BUN 15 15  --  16   < > 19 16 17 19   CREATININE 1.76* 1.67*   < > 1.64*   < > 1.96* 1.66* 1.77* 1.90*  CALCIUM 9.6 9.2  --  9.2   < > 9.1 9.6 9.5 9.3  GFRNONAA 30* 32*  --  32*   < > 28*  --  31* 29*  GFRAA 34* 37*  --  37*  --   --   --   --   --    < > = values in this interval not displayed.   CMP Latest Ref Rng & Units 09/18/2020 08/27/2020 08/18/2020  Glucose 70 - 99 mg/dL 102(H) 140(H) 105(H)  BUN 8 - 23 mg/dL 19 17 16   Creatinine 0.44 - 1.00 mg/dL 1.90(H) 1.77(H) 1.66(H)  Sodium 135 - 145 mmol/L 142 141 139  Potassium 3.5 - 5.1 mmol/L 4.2 4.3 4.7  Chloride 98 - 111 mmol/L 108  104 102  CO2 22 - 32 mmol/L 24 27 24   Calcium 8.9 - 10.3 mg/dL 9.3 9.5 9.6  Total Protein 6.5 - 8.1 g/dL 7.2 7.4 -  Total Bilirubin 0.3 - 1.2 mg/dL 0.3 0.3 -  Alkaline Phos 38 - 126 U/L 78 78 -  AST 15 - 41 U/L  20 20 -  ALT 0 - 44 U/L 14 13 -   CBC Latest Ref Rng & Units 09/18/2020 08/27/2020 08/07/2020  WBC 4.0 - 10.5 K/uL 6.8 7.0 7.7  Hemoglobin 12.0 - 15.0 g/dL 10.9(L) 10.9(L) 10.9(L)  Hematocrit 36.0 - 46.0 % 35.4(L) 36.1 35.4(L)  Platelets 150 - 400 K/uL 280 269 279    TSH Recent Labs    08/07/20 0722 08/27/20 0738 09/18/20 0758  TSH 2.516 2.087 2.626   Lab Results  Component Value Date   CHOL 132 08/18/2020   HDL 50 08/18/2020   LDLCALC 66 08/18/2020   LDLDIRECT 56 08/18/2020   TRIG 80 08/18/2020   Advanced lipid profile 08/18/2020:  ApoA-I plus B+ ratio  Apolipoprotein A-1 (116 - 209) mg/dL 155  Apolipoprotein B <90 mg/dL 57   Apolipoprotein B/A-1 Ratio  0.4, normal.  Component Ref Range & Units 1 mo ago  Lipoprotein (a) <75.0 nmol/L 107.3 High      External Labs 02/20/2018 03/17/2020: Hgb 11.1, HCT 36.5, platelet 249, MCV 79 Glucose 96, BUN 18, creatinine 1.67, GFR 32, sodium 142, potassium 4.9, A1c 6.0% Total cholesterol 169, triglycerides 54, HDL 58, LDL 100 TSH 2.50  05/28/2019: A1c 6.2%. Total cholesterol 173, triglycerides 63, HDL 60, LDL 100.  Non-HDL cholesterol 113.  A1C 6.600 09/04/2018 TSH 3.136 03/01/2019  Hemoglobin 8.800 03/01/2019. Platelets 295.000 03/01/2019 Creatinine, Serum 1.260 03/01/2019 Potassium 3.400 03/01/2019 Magnesium N/D ALT (SGPT) 24.000 03/01/2019  INR 1.000 10/10/2018  Allergies   Allergies  Allergen Reactions   Atorvastatin Other (See Comments)    Severe myalgia   Crestor [Rosuvastatin] Other (See Comments)    Severe myalgias.   Lisinopril Swelling    Lips and face swelled up.    Medications Prior to Visit:   Outpatient Medications Prior to Visit  Medication Sig Dispense Refill   acetaminophen (TYLENOL) 500 MG tablet Take 1,000 mg by mouth every 8 (eight) hours as needed for mild pain, fever or headache.      amLODipine (NORVASC) 10 MG tablet Take 1 tablet (10 mg total) by mouth daily. 90 tablet 3   aspirin EC 81 MG tablet  Take 81 mg by mouth daily.     carvedilol (COREG) 25 MG tablet Take 1/2 (one-half) tablet by mouth twice daily 180 tablet 0   Cholecalciferol (VITAMIN D-3) 125 MCG (5000 UT) TABS Take 5,000 Units by mouth daily.     Chromium Picolinate 1000 MCG TABS Take 1,000 mg by mouth daily.     empagliflozin (JARDIANCE) 25 MG TABS tablet Take 1 tablet (25 mg total) by mouth daily before breakfast. 30 tablet 3   esomeprazole (NEXIUM) 20 MG capsule Take 1 capsule by mouth daily.     ezetimibe (ZETIA) 10 MG tablet TAKE 1 TABLET BY MOUTH ONCE DAILY AFTER SUPPER 30 tablet 0   fexofenadine (ALLEGRA) 180 MG tablet 1 tablet swallow whole with water; do not take with fruit juices.     Lancets (ONETOUCH DELICA PLUS [id number]) MISC SMARTSIG:1 Topical 3 Times Daily     lidocaine-prilocaine (EMLA) cream Apply to the Port-A-Cath site 30 to 60 minutes before chemotherapy. 30 g 2   metFORMIN (GLUCOPHAGE) 500 MG  tablet Take 500 mg by mouth daily after breakfast.     methocarbamol (ROBAXIN) 500 MG tablet Take 1 tablet (500 mg total) by mouth every 6 (six) hours as needed for muscle spasms. 21 tablet 0   nitroGLYCERIN (NITROSTAT) 0.4 MG SL tablet Place 1 tablet (0.4 mg total) under the tongue every 5 (five) minutes as needed for chest pain. 25 tablet 2   ONETOUCH ULTRA test strip USE 1 STRIP TO CHECK GLUCOSE THREE TIMES DAILY     Pitavastatin Calcium (LIVALO) 4 MG TABS Take 4 mg by mouth at bedtime.      telmisartan (MICARDIS) 40 MG tablet Take 1 tablet by mouth daily.     azelastine (ASTELIN) 0.1 % nasal spray 1 spray ea nostril     No facility-administered medications prior to visit.   Final Medications at End of Visit    Current Meds  Medication Sig   acetaminophen (TYLENOL) 500 MG tablet Take 1,000 mg by mouth every 8 (eight) hours as needed for mild pain, fever or headache.    amLODipine (NORVASC) 10 MG tablet Take 1 tablet (10 mg total) by mouth daily.   aspirin EC 81 MG tablet Take 81 mg by mouth daily.    carvedilol (COREG) 25 MG tablet Take 1/2 (one-half) tablet by mouth twice daily   Cholecalciferol (VITAMIN D-3) 125 MCG (5000 UT) TABS Take 5,000 Units by mouth daily.   Chromium Picolinate 1000 MCG TABS Take 1,000 mg by mouth daily.   empagliflozin (JARDIANCE) 25 MG TABS tablet Take 1 tablet (25 mg total) by mouth daily before breakfast.   esomeprazole (NEXIUM) 20 MG capsule Take 1 capsule by mouth daily.   ezetimibe (ZETIA) 10 MG tablet TAKE 1 TABLET BY MOUTH ONCE DAILY AFTER SUPPER   fexofenadine (ALLEGRA) 180 MG tablet 1 tablet swallow whole with water; do not take with fruit juices.   Lancets (ONETOUCH DELICA PLUS [id number]) MISC SMARTSIG:1 Topical 3 Times Daily   lidocaine-prilocaine (EMLA) cream Apply to the Port-A-Cath site 30 to 60 minutes before chemotherapy.   metFORMIN (GLUCOPHAGE) 500 MG tablet Take 500 mg by mouth daily after breakfast.   methocarbamol (ROBAXIN) 500 MG tablet Take 1 tablet (500 mg total) by mouth every 6 (six) hours as needed for muscle spasms.   nitroGLYCERIN (NITROSTAT) 0.4 MG SL tablet Place 1 tablet (0.4 mg total) under the tongue every 5 (five) minutes as needed for chest pain.   ONETOUCH ULTRA test strip USE 1 STRIP TO CHECK GLUCOSE THREE TIMES DAILY   Pitavastatin Calcium (LIVALO) 4 MG TABS Take 4 mg by mouth at bedtime.    telmisartan (MICARDIS) 40 MG tablet Take 1 tablet by mouth daily.    Radiology:  No results found.   Cardiac Studies:   cardiac catheterization on 06/13/2012: High-grade 99% mid LAD stenosis, successful PTCA and stenting with 4.0 x 24 mm Veriflex non drug eluting stent.  Exercise tetrofosmin stress test  04/07/2020: Normal ECG stress. The patient exercised for 7 minutes and 58 seconds of a Bruce protocol, achieving approximately 10.11 METs.  No chest pain. NOrmap BP response. Myocardial perfusion is normal. Overall LV systolic function is normal without regional wall motion abnormalities. Stress LV EF: 64%. No previous exam  available for comparison. Low risk .  Echocardiogram 04/09/2020:  Left ventricle cavity is normal in size. Moderate concentric hypertrophy  of the left ventricle. Normal global wall motion. Normal LV systolic  function with EF 61%. Doppler evidence of grade I (impaired) diastolic  dysfunction, normal  LAP.  Left atrial cavity is mildly dilated.  Mild (Grade I) mitral regurgitation.  Mild tricuspid regurgitation.  No evidence of pulmonary hypertension.  Study in 2014 showed mild LVH, trace MR and TR>  EKG   EKG 01/25/2021: Sinus bradycardia at rate of 55 bpm, borderline left atrial enlargement, normal axis.  Diffuse nonspecific T abnormality, cannot exclude inferior and lateral ischemia.  T wave inversion more prominent compared to EKG 03/09/2019.  Assessment     ICD-10-CM   1. Primary hypertension  I10     2. Atherosclerosis of native coronary artery of native heart with stable angina pectoris ([place])  I25.118     3. Type 2 diabetes mellitus with stage 3b chronic kidney disease, without long-term current use of insulin (HCC)  E11.22    N18.32        No orders of the defined types were placed in this encounter.  Medications Discontinued During This Encounter  Medication Reason   azelastine (ASTELIN) 0.1 % nasal spray Error    No orders of the defined types were placed in this encounter.  Recommendations:   Carolyn [name] is a 65 y.o.  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  Extremely positive attitude and has been doing well. She continues to follow with oncology.   Patient is presently doing well.  On her last office visit 6 weeks ago, I had started her on Jardiance 25 mg daily for diabetes mellitus which  she is tolerating, blood pressure is improved and also she has lost about 4 pounds in weight in the last 6 weeks.  I reviewed her lipids, LDL is at goal.  Although LPA is mildly elevated, in view of overall lipid status, Apo B-A and ratio, will continue present medical therapy.  She has not had any recurrence of angina pectoris and has not used any sublingual nitroglycerin in the recent past.  With regard to hypertension, blood pressure is well controlled at home and usually less than 130 mmHg.  I expect that with continued weight loss and Jardiance on board, blood pressure will also continue to improve.  Hence I did not make any changes to her medications.  I like to see him back on an annual basis.    In spite of initiating Jardiance, renal function has remained stable.  She has an appointment to see her PCP next week, if appropriate I will request her to continue to prescribe Jardiance.   Adrian Prows, MD, [facility] 10/02/2020, 11:58 AM Office: [phone] Fax: [phone] Pager: [phone]

## 2020-10-03 ENCOUNTER — Encounter: Payer: Self-pay | Admitting: Radiation Oncology

## 2020-10-03 ENCOUNTER — Other Ambulatory Visit: Payer: Self-pay

## 2020-10-03 ENCOUNTER — Ambulatory Visit
Admission: RE | Admit: 2020-10-03 | Discharge: 2020-10-03 | Disposition: A | Discharge status: Home / Self Care | Payer: Medicare Other | Source: Ambulatory Visit | Attending: Radiation Oncology | Admitting: Radiation Oncology

## 2020-10-03 DIAGNOSIS — Z79899 Other long term (current) drug therapy: Secondary | ICD-10-CM | POA: Diagnosis not present

## 2020-10-03 DIAGNOSIS — Z7982 Long term (current) use of aspirin: Secondary | ICD-10-CM | POA: Diagnosis not present

## 2020-10-03 DIAGNOSIS — Z7984 Long term (current) use of oral hypoglycemic drugs: Secondary | ICD-10-CM | POA: Insufficient documentation

## 2020-10-03 DIAGNOSIS — C7931 Secondary malignant neoplasm of brain: Secondary | ICD-10-CM | POA: Diagnosis not present

## 2020-10-03 DIAGNOSIS — C3412 Malignant neoplasm of upper lobe, left bronchus or lung: Secondary | ICD-10-CM | POA: Insufficient documentation

## 2020-10-03 NOTE — Progress Notes (Signed)
Radiation Oncology         [phone] ________________________________  Name: Carolyn [name] MRN: [id number]  Date: 10/03/2020  DOB: [birth year]  Follow-Up Visit Note  Outpatient  CC: [name], FNP  Curt Bears, MD  Diagnosis and Prior Radiotherapy:    ICD-10-CM   1. Brain metastases ([employer])  C79.31       CHIEF COMPLAINT: Here for follow-up and surveillance of brain metastasis  Narrative:  The patient returns today for routine follow-up.  She is doing well.  She denies any seizures, nausea, or new neurologic deficits.  She has occasional headaches that responds to Tylenol and occasional unsteady gait.  She continues to follow-up with Dr. Julien Nordmann and is pleased with how she is doing thus far                             ALLERGIES:  is allergic to atorvastatin, crestor [rosuvastatin], and lisinopril.  Meds: Current Outpatient Medications  Medication Sig Dispense Refill   acetaminophen (TYLENOL) 500 MG tablet Take 1,000 mg by mouth every 8 (eight) hours as needed for mild pain, fever or headache.      amLODipine (NORVASC) 10 MG tablet Take 1 tablet (10 mg total) by mouth daily. 90 tablet 3   aspirin EC 81 MG tablet Take 81 mg by mouth daily.     carvedilol (COREG) 25 MG tablet Take 1/2 (one-half) tablet by mouth twice daily 180 tablet 0   Cholecalciferol (VITAMIN D-3) 125 MCG (5000 UT) TABS Take 5,000 Units by mouth daily.     Chromium Picolinate 1000 MCG TABS Take 1,000 mg by mouth daily.     empagliflozin (JARDIANCE) 25 MG TABS tablet Take 1 tablet (25 mg total) by mouth daily before breakfast. 30 tablet 3   esomeprazole (NEXIUM) 20 MG capsule Take 1 capsule by mouth daily.     ezetimibe (ZETIA) 10 MG tablet TAKE 1 TABLET BY MOUTH ONCE DAILY AFTER SUPPER 30 tablet 0   fexofenadine (ALLEGRA) 180 MG tablet 1 tablet swallow whole with water; do not take with fruit juices.     Lancets (ONETOUCH DELICA PLUS [id number]) MISC SMARTSIG:1 Topical 3 Times Daily      lidocaine-prilocaine (EMLA) cream Apply to the Port-A-Cath site 30 to 60 minutes before chemotherapy. 30 g 2   metFORMIN (GLUCOPHAGE) 500 MG tablet Take 500 mg by mouth daily after breakfast.     methocarbamol (ROBAXIN) 500 MG tablet Take 1 tablet (500 mg total) by mouth every 6 (six) hours as needed for muscle spasms. 21 tablet 0   nitroGLYCERIN (NITROSTAT) 0.4 MG SL tablet Place 1 tablet (0.4 mg total) under the tongue every 5 (five) minutes as needed for chest pain. 25 tablet 2   ONETOUCH ULTRA test strip USE 1 STRIP TO CHECK GLUCOSE THREE TIMES DAILY     Pitavastatin Calcium (LIVALO) 4 MG TABS Take 4 mg by mouth at bedtime.      telmisartan (MICARDIS) 40 MG tablet Take 1 tablet by mouth daily.     No current facility-administered medications for this encounter.    Physical Findings: The patient is in no acute distress. Patient is alert and oriented.  height is 5\' 1"  (1.549 m). Her oral temperature is 98.4 F (36.9 C). Her blood pressure is 133/68 and her pulse is 55 (abnormal). Her respiration is 18 and oxygen saturation is 99%. Marland Kitchen    HEENT extraocular movements are intact NEURO: Gait is stable.  Coordination is grossly intact.  Strength is symmetric and intact bilaterally.  She is alert and oriented.  Speech is fluent.  Lab Findings: Lab Results  Component Value Date   WBC 6.8 09/18/2020   HGB 10.9 (L) 09/18/2020   HCT 35.4 (L) 09/18/2020   MCV 79.6 (L) 09/18/2020   PLT 280 09/18/2020    Radiographic Findings: No results found.  Impression/Plan:   She is doing well after radiosurgery.  She will undergo another MRI of her brain in 2 months and follow-up with neurosurgery at that time.  I will see her after the subsequent scan.  On date of service, in total, I spent 15 minutes on this encounter. Patient was seen in person.  _____________________________________   Eppie Gibson, MD

## 2020-10-03 NOTE — Progress Notes (Signed)
[name] presents today for follow-ip after completing SRS to her solitary brain mass on 09/02/2020  Dose of Decadron, if applicable: Not currently prescribed  Recent neurologic symptoms, if any:  Seizures: no Headaches: yes, patient reports occasional headache, which is relieved with Tylenol. Nausea: no Dizziness/ataxia: yes, reports unsteady gait Difficulty with hand coordination: no Focal numbness/weakness: no Visual deficits/changes: no Confusion/Memory deficits: no  Additional Complaints / other details: Had F/U with her medical oncologist Dr. Julien Nordmann prior to her cycle of pembrolizumab.   Vitals:   10/03/20 1432  BP: 133/68  Pulse: (!) 55  Resp: 18  Temp: 98.4 F (36.9 C)  TempSrc: Oral  SpO2: 99%  Height: 5\' 1"  (1.549 m)

## 2020-10-06 ENCOUNTER — Ambulatory Visit: Admitting: Cardiology

## 2020-10-07 ENCOUNTER — Ambulatory Visit (HOSPITAL_COMMUNITY)
Admission: RE | Admit: 2020-10-07 | Discharge: 2020-10-07 | Disposition: A | Discharge status: Home / Self Care | Payer: Medicare Other | Source: Ambulatory Visit | Attending: Internal Medicine | Admitting: Internal Medicine

## 2020-10-07 ENCOUNTER — Other Ambulatory Visit: Payer: Self-pay | Admitting: *Deleted

## 2020-10-07 ENCOUNTER — Other Ambulatory Visit: Payer: Self-pay

## 2020-10-07 ENCOUNTER — Ambulatory Visit (HOSPITAL_COMMUNITY): Payer: Medicare Other

## 2020-10-07 DIAGNOSIS — R911 Solitary pulmonary nodule: Secondary | ICD-10-CM | POA: Diagnosis not present

## 2020-10-07 DIAGNOSIS — I313 Pericardial effusion (noninflammatory): Secondary | ICD-10-CM | POA: Diagnosis not present

## 2020-10-07 DIAGNOSIS — C3492 Malignant neoplasm of unspecified part of left bronchus or lung: Secondary | ICD-10-CM

## 2020-10-07 DIAGNOSIS — C3491 Malignant neoplasm of unspecified part of right bronchus or lung: Secondary | ICD-10-CM

## 2020-10-07 DIAGNOSIS — E1122 Type 2 diabetes mellitus with diabetic chronic kidney disease: Secondary | ICD-10-CM | POA: Diagnosis not present

## 2020-10-07 DIAGNOSIS — C3490 Malignant neoplasm of unspecified part of unspecified bronchus or lung: Secondary | ICD-10-CM | POA: Insufficient documentation

## 2020-10-07 DIAGNOSIS — R5383 Other fatigue: Secondary | ICD-10-CM

## 2020-10-07 DIAGNOSIS — I10 Essential (primary) hypertension: Secondary | ICD-10-CM | POA: Diagnosis not present

## 2020-10-07 DIAGNOSIS — N183 Chronic kidney disease, stage 3 unspecified: Secondary | ICD-10-CM | POA: Diagnosis not present

## 2020-10-07 DIAGNOSIS — E559 Vitamin D deficiency, unspecified: Secondary | ICD-10-CM | POA: Diagnosis not present

## 2020-10-08 ENCOUNTER — Inpatient Hospital Stay (HOSPITAL_BASED_OUTPATIENT_CLINIC_OR_DEPARTMENT_OTHER): Payer: Medicare Other | Admitting: Internal Medicine

## 2020-10-08 ENCOUNTER — Inpatient Hospital Stay: Payer: Medicare Other

## 2020-10-08 ENCOUNTER — Encounter: Payer: Self-pay | Admitting: Internal Medicine

## 2020-10-08 VITALS — BP 130/68 | HR 71 | Temp 97.9°F | Resp 20 | Ht 61.0 in | Wt 175.2 lb

## 2020-10-08 DIAGNOSIS — C3492 Malignant neoplasm of unspecified part of left bronchus or lung: Secondary | ICD-10-CM

## 2020-10-08 DIAGNOSIS — C3491 Malignant neoplasm of unspecified part of right bronchus or lung: Secondary | ICD-10-CM

## 2020-10-08 DIAGNOSIS — C7931 Secondary malignant neoplasm of brain: Secondary | ICD-10-CM | POA: Diagnosis not present

## 2020-10-08 DIAGNOSIS — Z95828 Presence of other vascular implants and grafts: Secondary | ICD-10-CM

## 2020-10-08 DIAGNOSIS — R5383 Other fatigue: Secondary | ICD-10-CM

## 2020-10-08 DIAGNOSIS — Z5112 Encounter for antineoplastic immunotherapy: Secondary | ICD-10-CM

## 2020-10-08 DIAGNOSIS — C3412 Malignant neoplasm of upper lobe, left bronchus or lung: Secondary | ICD-10-CM | POA: Diagnosis not present

## 2020-10-08 DIAGNOSIS — Z79899 Other long term (current) drug therapy: Secondary | ICD-10-CM | POA: Diagnosis not present

## 2020-10-08 LAB — CBC WITH DIFFERENTIAL (CANCER CENTER ONLY)
Abs Immature Granulocytes: 0.02 10*3/uL (ref 0.00–0.07)
Basophils Absolute: 0.1 10*3/uL (ref 0.0–0.1)
Basophils Relative: 1 %
Eosinophils Absolute: 0.4 10*3/uL (ref 0.0–0.5)
Eosinophils Relative: 5 %
HCT: 37.3 % (ref 36.0–46.0)
Hemoglobin: 11.4 g/dL — ABNORMAL LOW (ref 12.0–15.0)
Immature Granulocytes: 0 %
Lymphocytes Relative: 15 %
Lymphs Abs: 1.2 10*3/uL (ref 0.7–4.0)
MCH: 24.5 pg — ABNORMAL LOW (ref 26.0–34.0)
MCHC: 30.6 g/dL (ref 30.0–36.0)
MCV: 80 fL (ref 80.0–100.0)
Monocytes Absolute: 0.5 10*3/uL (ref 0.1–1.0)
Monocytes Relative: 7 %
Neutro Abs: 5.5 10*3/uL (ref 1.7–7.7)
Neutrophils Relative %: 72 %
Platelet Count: 316 10*3/uL (ref 150–400)
RBC: 4.66 MIL/uL (ref 3.87–5.11)
RDW: 16.5 % — ABNORMAL HIGH (ref 11.5–15.5)
WBC Count: 7.7 10*3/uL (ref 4.0–10.5)
nRBC: 0 % (ref 0.0–0.2)

## 2020-10-08 LAB — CMP (CANCER CENTER ONLY)
ALT: 15 U/L (ref 0–44)
AST: 18 U/L (ref 15–41)
Albumin: 3.4 g/dL — ABNORMAL LOW (ref 3.5–5.0)
Alkaline Phosphatase: 83 U/L (ref 38–126)
Anion gap: 11 (ref 5–15)
BUN: 16 mg/dL (ref 8–23)
CO2: 25 mmol/L (ref 22–32)
Calcium: 9.2 mg/dL (ref 8.9–10.3)
Chloride: 105 mmol/L (ref 98–111)
Creatinine: 1.76 mg/dL — ABNORMAL HIGH (ref 0.44–1.00)
GFR, Estimated: 31 mL/min — ABNORMAL LOW (ref >60)
Glucose, Bld: 147 mg/dL — ABNORMAL HIGH (ref 70–99)
Potassium: 4.1 mmol/L (ref 3.5–5.1)
Sodium: 141 mmol/L (ref 135–145)
Total Bilirubin: 0.2 mg/dL — ABNORMAL LOW (ref 0.3–1.2)
Total Protein: 7.3 g/dL (ref 6.5–8.1)

## 2020-10-08 LAB — TOTAL PROTEIN, URINE DIPSTICK: Protein, ur: NEGATIVE mg/dL

## 2020-10-08 MED ORDER — SODIUM CHLORIDE 0.9% FLUSH
10.0000 mL | INTRAVENOUS | Status: DC | PRN
Start: 2020-10-08 — End: 2020-10-08
  Administered 2020-10-08: 10 mL

## 2020-10-08 MED ORDER — SODIUM CHLORIDE 0.9% FLUSH
10.0000 mL | INTRAVENOUS | Status: DC | PRN
  Administered 2020-10-08: 10 mL

## 2020-10-08 MED ORDER — SODIUM CHLORIDE 0.9 % IV SOLN: Freq: Once | INTRAVENOUS | Status: AC

## 2020-10-08 MED ORDER — SODIUM CHLORIDE 0.9 % IV SOLN
200.0000 mg | Freq: Once | INTRAVENOUS | Status: AC
  Administered 2020-10-08: 200 mg via INTRAVENOUS
  Filled 2020-10-08: qty 8

## 2020-10-08 MED ORDER — HEPARIN SOD (PORK) LOCK FLUSH 100 UNIT/ML IV SOLN
500.0000 [IU] | Freq: Once | INTRAVENOUS | Status: AC | PRN
  Administered 2020-10-08: 500 [IU]

## 2020-10-08 NOTE — Progress Notes (Signed)
                                                                                                                                                             Patient Name: Carolyn [name] MRN: [id number] DOB: 25-Sep-1955 Referring Physician: Curt Bears (Profile Not Attached) Date of Service: 09/02/2020 [facility], [place]                                                        End Of Treatment Note  Diagnoses: C79.31-Secondary malignant neoplasm of brain  Cancer Staging: STAGE IV  Intent: Palliative  Radiation Treatment Dates: 09/02/2020 through 09/02/2020 Site Technique Total Dose (Gy) Dose per Fx (Gy) Completed Fx Beam Energies  Brain: Brain_SRS 3D 20/20 20 1/1 [id number]   Left temporal 4 mm metastasis was treated using stereotactic radiosurgery technique to a dose of 20 Gray in 1 fraction.  Narrative: The patient tolerated radiation therapy relatively well.   Plan: The patient will follow-up with radiation oncology in 53mo. -----------------------------------  Eppie Gibson, MD

## 2020-10-08 NOTE — Progress Notes (Signed)
[facility] [phone]   [phone]  OFFICE PROGRESS NOTE  [name], [place] [address]  DIAGNOSIS: Stage IV (T1c, N1, M1c ) non-small cell lung cancer, adenocarcinoma diagnosed in July 2020 and presented with left upper lobe lung nodule in addition to right upper lobe lung nodule with left hilar lymphadenopathy as well as multiple brain metastasis.  Biomarker Findings Microsatellite status - MS-Stable Tumor Mutational Burden - 4 Muts/Mb Genomic Findings For a complete list of the genes assayed, please refer to the Appendix. ATM G204* KRAS G12C PDGFRA P122T SMO R199W 7 Disease relevant genes with no reportable alterations: ALK, BRAF, EGFR, ERBB2, MET, RET, ROS1  PDL 1 expression was 1%  PRIOR THERAPY:  1) SRS to multiple brain metastasis under the care of Dr. Isidore Moos. 2) SRS to a 4 mm new brain metastasis under the care of Dr. Isidore Moos and Dr. Saintclair Halsted on 09/02/2020  CURRENT THERAPY: Systemic chemotherapy with carboplatin for AUC of 5, Alimta 500 mg/M2 and Keytruda 200 mg IV every 3 weeks.  First dose October 05, 2018.  Status post 35 cycles.  Starting from cycle #5 the patient will be on treatment with maintenance Alimta 500 mg/M2 and Keytruda 200 mg IV every 3 weeks.  She has been on treatment with single agent Keytruda recently secondary to renal insufficiency.  INTERVAL HISTORY: Carolyn [name] 65 y.o. female returns to the clinic today for follow-up visit.  The patient is feeling fine today with no concerning complaints except for pain on the left hip area after sitting for more than 2 hours yesterday for her scan.  She denied having any current chest pain but has shortness of breath with exertion with no cough or hemoptysis.  She denied having any fever or chills.  She has no nausea, vomiting, diarrhea or constipation.  She has no headache or visual changes.  She has no weight loss or night  sweats.  She continues to tolerate her treatment with Keytruda fairly well.  The patient is here today for evaluation before starting cycle #36 with repeat CT scan of the chest, abdomen and pelvis for restaging of her disease.   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  MEDICATIONS:  Current Outpatient Medications  Medication Sig Dispense Refill   acetaminophen (TYLENOL) 500 MG tablet Take 1,000 mg by mouth every 8 (eight) hours as needed for mild pain, fever or headache.      amLODipine (NORVASC) 10 MG tablet Take 1 tablet (10 mg total) by mouth daily. 90 tablet 3   aspirin EC 81 MG tablet Take 81 mg by mouth daily.  carvedilol (COREG) 25 MG tablet Take 1/2 (one-half) tablet by mouth twice daily 180 tablet 0   Cholecalciferol (VITAMIN D-3) 125 MCG (5000 UT) TABS Take 5,000 Units by mouth daily.     Chromium Picolinate 1000 MCG TABS Take 1,000 mg by mouth daily.     empagliflozin (JARDIANCE) 25 MG TABS tablet Take 1 tablet (25 mg total) by mouth daily before breakfast. 30 tablet 3   esomeprazole (NEXIUM) 20 MG capsule Take 1 capsule by mouth daily.     ezetimibe (ZETIA) 10 MG tablet TAKE 1 TABLET BY MOUTH ONCE DAILY AFTER SUPPER 30 tablet 0   fexofenadine (ALLEGRA) 180 MG tablet 1 tablet swallow whole with water; do not take with fruit juices.     Lancets (ONETOUCH DELICA PLUS [id number])  MISC SMARTSIG:1 Topical 3 Times Daily     lidocaine-prilocaine (EMLA) cream Apply to the Port-A-Cath site 30 to 60 minutes before chemotherapy. 30 g 2   metFORMIN (GLUCOPHAGE) 500 MG tablet Take 500 mg by mouth daily after breakfast.     methocarbamol (ROBAXIN) 500 MG tablet Take 1 tablet (500 mg total) by mouth every 6 (six) hours as needed for muscle spasms. 21 tablet 0   nitroGLYCERIN (NITROSTAT) 0.4 MG SL tablet Place 1 tablet (0.4 mg total) under the tongue every 5 (five) minutes as needed for chest pain. 25 tablet 2   ONETOUCH ULTRA test strip USE 1 STRIP TO CHECK GLUCOSE THREE TIMES DAILY     Pitavastatin Calcium (LIVALO) 4 MG TABS Take 4 mg by mouth at bedtime.      telmisartan (MICARDIS) 40 MG tablet Take 1 tablet by mouth daily.     No current facility-administered medications for this visit.    SURGICAL HISTORY:  Past Surgical History:  Procedure Laterality Date   ABDOMINAL HYSTERECTOMY     bone spur     CARDIAC CATHETERIZATION  06/13/2012   carpel tunnel surgery Left    COLONOSCOPY     9 years ago   CORONARY ANGIOPLASTY  06/13/2012   EYE SURGERY     cyst left eye    IR IMAGING GUIDED PORT INSERTION  10/10/2018   LEFT HEART CATHETERIZATION WITH CORONARY ANGIOGRAM N/A 06/13/2012   Procedure: LEFT HEART CATHETERIZATION WITH CORONARY ANGIOGRAM;  Surgeon: Laverda Page, MD;  Location: [facility] CATH LAB;  Service: Cardiovascular;  Laterality: N/A;   NECK SURGERY     rotator cuff surgery Right 2015   VIDEO BRONCHOSCOPY WITH ENDOBRONCHIAL NAVIGATION N/A 09/06/2018   Procedure: VIDEO BRONCHOSCOPY WITH ENDOBRONCHIAL NAVIGATION, left upper lung;  Surgeon: Lajuana Matte, MD;  Location: [facility];  Service: Thoracic;  Laterality: N/A;   VIDEO BRONCHOSCOPY WITH ENDOBRONCHIAL ULTRASOUND Left 09/06/2018   Procedure: VIDEO BRONCHOSCOPY WITH ENDOBRONCHIAL ULTRASOUND, left lung;  Surgeon: Lajuana Matte, MD;  Location: [facility];  Service: Thoracic;  Laterality: Left;   WISDOM TOOTH EXTRACTION       REVIEW OF SYSTEMS:  Constitutional: positive for fatigue Eyes: negative Ears, nose, mouth, throat, and face: negative Respiratory: positive for dyspnea on exertion Cardiovascular: negative Gastrointestinal: negative Genitourinary:negative Integument/breast: negative Hematologic/lymphatic: negative Musculoskeletal:positive for arthralgias Neurological: negative Behavioral/Psych: negative Endocrine: negative Allergic/Immunologic: negative   PHYSICAL EXAMINATION: General appearance: alert, cooperative, fatigued, and no distress Head: Normocephalic, without obvious abnormality, atraumatic Neck: no adenopathy, no JVD, supple, symmetrical, trachea midline, and thyroid not enlarged, symmetric, no tenderness/mass/nodules Lymph nodes: Cervical, supraclavicular, and axillary nodes normal. Resp: clear to auscultation bilaterally Back: symmetric, no curvature. ROM normal. No CVA tenderness. Cardio: regular  rate and rhythm, S1, S2 normal, no murmur, click, rub or gallop GI: soft, non-tender; bowel sounds normal; no masses,  no organomegaly Extremities: extremities normal, atraumatic, no cyanosis or edema Neurologic: Alert and oriented X 3, normal strength and tone. Normal symmetric reflexes. Normal coordination and gait  ECOG PERFORMANCE STATUS: 1 - Symptomatic but completely ambulatory  Blood pressure 130/68, pulse 71, temperature 97.9 F (36.6 C), temperature source Tympanic, resp. rate 20, height $RemoveBe'5\' 1"'ilnsSrawi$  (1.549 m), weight 175 lb 3.2 oz (79.5 kg), SpO2 100 %.  LABORATORY DATA: Lab Results  Component Value Date   WBC 7.7 10/08/2020   HGB 11.4 (L) 10/08/2020   HCT 37.3 10/08/2020   MCV 80.0 10/08/2020   PLT 316 10/08/2020      Chemistry      Component Value Date/Time   NA 142 09/18/2020 0758   NA 139 08/18/2020 0801   K 4.2 09/18/2020 0758   CL 108 09/18/2020 0758   CO2 24 09/18/2020 0758   BUN 19 09/18/2020 0758   BUN 16 08/18/2020 0801   CREATININE 1.90 (H) 09/18/2020 0758       Component Value Date/Time   CALCIUM 9.3 09/18/2020 0758   ALKPHOS 78 09/18/2020 0758   AST 20 09/18/2020 0758   ALT 14 09/18/2020 0758   BILITOT 0.3 09/18/2020 0758       RADIOGRAPHIC STUDIES: CT Chest Wo Contrast  Result Date: 10/08/2020 CLINICAL DATA:  Lung cancer surveillance. EXAM: CT CHEST, ABDOMEN AND PELVIS WITHOUT CONTRAST TECHNIQUE: Multidetector CT imaging of the chest, abdomen and pelvis was performed following the standard protocol without IV contrast. COMPARISON:  CT 07/15/2020 FINDINGS: CT CHEST FINDINGS Cardiovascular: Coronary artery calcification and aortic atherosclerotic calcification. Small pericardial effusion unchanged. Mediastinum/Nodes: No axillary or supraclavicular adenopathy. No mediastinal or hilar adenopathy. No pericardial fluid. Esophagus normal. Port in the anterior chest wall with tip in distal SVC. Lungs/Pleura: Nodular focus with associated linear scarring in the LEFT upper lobe measures 7 mm (image 45/series 508) unchanged from 8 mm on most recent CT exam. Sub solid subpleural nodulein the RIGHT lung (image 38) is also unchanged. No new pulmonary nodularity. Musculoskeletal: No aggressive osseous lesion. CT ABDOMEN PELVIS FINDINGS Hepatobiliary: No focal hepatic lesion. No biliary duct dilatation. Common bile duct is normal. Pancreas: Pancreas is normal. No ductal dilatation. No pancreatic inflammation. Spleen: Normal spleen Adrenals/urinary tract: Adrenal glands and kidneys are normal. The ureters and bladder normal. Stomach/Bowel: Stomach, small bowel, appendix, and cecum are normal. The colon and rectosigmoid colon are normal. Vascular/Lymphatic: Abdominal aorta is normal caliber. No periportal or retroperitoneal adenopathy. No pelvic adenopathy. Reproductive: Post hysterectomy.  Adnexa unremarkable Other: No omental peritoneal metastasis Musculoskeletal: No aggressive osseous lesion. IMPRESSION: 1. Stable nodularity and scarring in the lung. No evidence of  lung cancer recurrence or disease progression. No interval change from CT 07/15/2020. 2. No evidence of visceral metastasis, nodal metastasis or skeletal metastasis. Electronically Signed   By: Suzy Bouchard M.D.   On: 10/08/2020 09:01     ASSESSMENT AND PLAN: This is a very pleasant 65 years old African-American female with likely stage IV (T1c, N1, M1C) non-small cell lung cancer, adenocarcinoma with K-ras G12C mutation diagnosed in July 2020 and presented with left upper lobe lung nodule in addition to right upper lobe pulmonary nodule as well as left hilar adenopathy and metastatic lesion to the brain. Molecular studies showed no actionable mutation and PDL 1 expression was 1%. The patient underwent SBRT to the metastatic brain lesion under the care  of Dr. Isidore Moos. The patient is currently undergoing systemic chemotherapy with carboplatin for AUC of 5, Alimta 500 mg/M2 and Keytruda 200 mg IV every 3 weeks is status post 35 cycles.  Starting from cycle #5 she is on maintenance treatment with Alimta and Keytruda every 3 weeks with only Keytruda on the last few cycles because of the renal insufficiency. The patient continues to tolerate her maintenance treatment with Keytruda fairly well with no concerning adverse effects. She had repeat CT scan of the chest, abdomen pelvis performed recently.  I personally and independently reviewed the scan images and compared to the previous scans.  I did not see any concerning findings for progression but the final report is still pending and I will wait for confirmation. I recommended for the patient to proceed with her treatment today and she will receive cycle #36 of her treatment with single agent Keytruda. The patient will come back for follow-up visit in 3 weeks for evaluation before the next cycle of her treatment. For the hypertension and renal insufficiency, she will continue her routine follow-up visit by her primary care physician and nephrologist. She  was advised to call immediately if she has any other concerning symptoms in the interval. The patient voices understanding of current disease status and treatment options and is in agreement with the current care plan. All questions were answered. The patient knows to call the clinic with any problems, questions or concerns. We can certainly see the patient much sooner if necessary.  Disclaimer: This note was dictated with voice recognition software. Similar sounding words can inadvertently be transcribed and may not be corrected upon review.

## 2020-10-09 ENCOUNTER — Other Ambulatory Visit: Payer: Self-pay | Admitting: Cardiology

## 2020-10-09 DIAGNOSIS — E7800 Pure hypercholesterolemia, unspecified: Secondary | ICD-10-CM

## 2020-10-13 NOTE — Progress Notes (Signed)
Labs 09/30/2020:  Hb 11.7/HCT 37.5, microcytic indicis, platelets 299.  BUN 21, creatinine 1.62, EGFR 38 mL, potassium 4.7, sodium 144.  A1c 6.4%.  Total cholesterol 126, triglycerides 57, HDL 59, LDL 56.

## 2020-10-15 ENCOUNTER — Telehealth: Payer: Self-pay | Admitting: Cardiology

## 2020-10-15 NOTE — Telephone Encounter (Signed)
Pt is requesting a call to update pharmacy and rx refill info.

## 2020-10-16 ENCOUNTER — Other Ambulatory Visit: Payer: Self-pay

## 2020-10-16 DIAGNOSIS — N1832 Chronic kidney disease, stage 3b: Secondary | ICD-10-CM | POA: Diagnosis not present

## 2020-10-16 DIAGNOSIS — E1122 Type 2 diabetes mellitus with diabetic chronic kidney disease: Secondary | ICD-10-CM | POA: Diagnosis not present

## 2020-10-16 DIAGNOSIS — C3492 Malignant neoplasm of unspecified part of left bronchus or lung: Secondary | ICD-10-CM | POA: Diagnosis not present

## 2020-10-16 DIAGNOSIS — I129 Hypertensive chronic kidney disease with stage 1 through stage 4 chronic kidney disease, or unspecified chronic kidney disease: Secondary | ICD-10-CM | POA: Diagnosis not present

## 2020-10-16 MED ORDER — EMPAGLIFLOZIN 25 MG PO TABS: 25.0000 mg | ORAL_TABLET | Freq: Every day | ORAL | 3 refills | Status: DC

## 2020-10-17 NOTE — Telephone Encounter (Signed)
Called pt to see what medication she needed pt mention it was jardiance and she got samples yesterday.

## 2020-10-29 ENCOUNTER — Inpatient Hospital Stay: Payer: Medicare Other

## 2020-10-29 ENCOUNTER — Inpatient Hospital Stay (HOSPITAL_BASED_OUTPATIENT_CLINIC_OR_DEPARTMENT_OTHER): Payer: Medicare Other | Admitting: Internal Medicine

## 2020-10-29 ENCOUNTER — Inpatient Hospital Stay: Payer: Medicare Other | Attending: Physician Assistant

## 2020-10-29 ENCOUNTER — Other Ambulatory Visit: Payer: Self-pay

## 2020-10-29 VITALS — BP 119/58 | HR 57 | Temp 97.6°F | Resp 16 | Ht 61.0 in | Wt 176.4 lb

## 2020-10-29 DIAGNOSIS — C3412 Malignant neoplasm of upper lobe, left bronchus or lung: Secondary | ICD-10-CM | POA: Diagnosis not present

## 2020-10-29 DIAGNOSIS — Z79899 Other long term (current) drug therapy: Secondary | ICD-10-CM | POA: Diagnosis not present

## 2020-10-29 DIAGNOSIS — Z5112 Encounter for antineoplastic immunotherapy: Secondary | ICD-10-CM | POA: Diagnosis not present

## 2020-10-29 DIAGNOSIS — C3491 Malignant neoplasm of unspecified part of right bronchus or lung: Secondary | ICD-10-CM | POA: Diagnosis not present

## 2020-10-29 DIAGNOSIS — C7931 Secondary malignant neoplasm of brain: Secondary | ICD-10-CM

## 2020-10-29 DIAGNOSIS — Z95828 Presence of other vascular implants and grafts: Secondary | ICD-10-CM

## 2020-10-29 DIAGNOSIS — C3492 Malignant neoplasm of unspecified part of left bronchus or lung: Secondary | ICD-10-CM

## 2020-10-29 LAB — CBC WITH DIFFERENTIAL (CANCER CENTER ONLY)
Abs Immature Granulocytes: 0.01 10*3/uL (ref 0.00–0.07)
Basophils Absolute: 0 10*3/uL (ref 0.0–0.1)
Basophils Relative: 1 %
Eosinophils Absolute: 0.4 10*3/uL (ref 0.0–0.5)
Eosinophils Relative: 6 %
HCT: 37.3 % (ref 36.0–46.0)
Hemoglobin: 11.3 g/dL — ABNORMAL LOW (ref 12.0–15.0)
Immature Granulocytes: 0 %
Lymphocytes Relative: 19 %
Lymphs Abs: 1.2 10*3/uL (ref 0.7–4.0)
MCH: 24.1 pg — ABNORMAL LOW (ref 26.0–34.0)
MCHC: 30.3 g/dL (ref 30.0–36.0)
MCV: 79.5 fL — ABNORMAL LOW (ref 80.0–100.0)
Monocytes Absolute: 0.5 10*3/uL (ref 0.1–1.0)
Monocytes Relative: 8 %
Neutro Abs: 4.1 10*3/uL (ref 1.7–7.7)
Neutrophils Relative %: 66 %
Platelet Count: 257 10*3/uL (ref 150–400)
RBC: 4.69 MIL/uL (ref 3.87–5.11)
RDW: 16.4 % — ABNORMAL HIGH (ref 11.5–15.5)
WBC Count: 6.1 10*3/uL (ref 4.0–10.5)
nRBC: 0 % (ref 0.0–0.2)

## 2020-10-29 LAB — CMP (CANCER CENTER ONLY)
ALT: 11 U/L (ref 0–44)
AST: 18 U/L (ref 15–41)
Albumin: 3.4 g/dL — ABNORMAL LOW (ref 3.5–5.0)
Alkaline Phosphatase: 83 U/L (ref 38–126)
Anion gap: 9 (ref 5–15)
BUN: 15 mg/dL (ref 8–23)
CO2: 25 mmol/L (ref 22–32)
Calcium: 9.4 mg/dL (ref 8.9–10.3)
Chloride: 108 mmol/L (ref 98–111)
Creatinine: 1.9 mg/dL — ABNORMAL HIGH (ref 0.44–1.00)
GFR, Estimated: 29 mL/min — ABNORMAL LOW (ref >60)
Glucose, Bld: 130 mg/dL — ABNORMAL HIGH (ref 70–99)
Potassium: 4.1 mmol/L (ref 3.5–5.1)
Sodium: 142 mmol/L (ref 135–145)
Total Bilirubin: 0.3 mg/dL (ref 0.3–1.2)
Total Protein: 7.3 g/dL (ref 6.5–8.1)

## 2020-10-29 MED ORDER — SODIUM CHLORIDE 0.9 % IV SOLN
200.0000 mg | Freq: Once | INTRAVENOUS | Status: AC
  Administered 2020-10-29: 200 mg via INTRAVENOUS
  Filled 2020-10-29: qty 8

## 2020-10-29 MED ORDER — SODIUM CHLORIDE 0.9% FLUSH
10.0000 mL | INTRAVENOUS | Status: DC | PRN
  Administered 2020-10-29: 10 mL

## 2020-10-29 MED ORDER — HEPARIN SOD (PORK) LOCK FLUSH 100 UNIT/ML IV SOLN
500.0000 [IU] | Freq: Once | INTRAVENOUS | Status: AC | PRN
  Administered 2020-10-29: 500 [IU]

## 2020-10-29 MED ORDER — SODIUM CHLORIDE 0.9 % IV SOLN: Freq: Once | INTRAVENOUS | Status: AC

## 2020-10-29 NOTE — Progress Notes (Signed)
Per Dr. Julien Nordmann, "OK To Treat w/elevated Cr+"

## 2020-10-29 NOTE — Progress Notes (Signed)
[facility] [phone]   [phone]  OFFICE PROGRESS NOTE  [name], [place] [address]  DIAGNOSIS: Stage IV (T1c, N1, M1c ) non-small cell lung cancer, adenocarcinoma diagnosed in July 2020 and presented with left upper lobe lung nodule in addition to right upper lobe lung nodule with left hilar lymphadenopathy as well as multiple brain metastasis.  Biomarker Findings Microsatellite status - MS-Stable Tumor Mutational Burden - 4 Muts/Mb Genomic Findings For a complete list of the genes assayed, please refer to the Appendix. ATM G204* KRAS G12C PDGFRA P122T SMO R199W 7 Disease relevant genes with no reportable alterations: ALK, BRAF, EGFR, ERBB2, MET, RET, ROS1  PDL 1 expression was 1%  PRIOR THERAPY:  1) SRS to multiple brain metastasis under the care of Dr. Isidore Moos. 2) SRS to a 4 mm new brain metastasis under the care of Dr. Isidore Moos and Dr. Saintclair Halsted on 09/02/2020  CURRENT THERAPY: Systemic chemotherapy with carboplatin for AUC of 5, Alimta 500 mg/M2 and Keytruda 200 mg IV every 3 weeks.  First dose October 05, 2018.  Status post 36 cycles.  Starting from cycle #5 the patient will be on treatment with maintenance Alimta 500 mg/M2 and Keytruda 200 mg IV every 3 weeks.  She has been on treatment with single agent Keytruda recently secondary to renal insufficiency.  INTERVAL HISTORY: Carolyn [name] 65 y.o. female returns to the clinic today for follow-up visit.  The patient is feeling fine today with no concerning complaints except for fatigue.  She denied having any current chest pain but has shortness of breath with exertion with no cough or hemoptysis.  She denied having any recent weight loss or night sweats.  She has no nausea, vomiting, diarrhea or constipation.  She has no headache or visual changes.  She is here today for evaluation before starting cycle #37 of her treatment.   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  MEDICATIONS:  Current Outpatient Medications  Medication Sig Dispense Refill   acetaminophen (TYLENOL) 500 MG tablet Take 1,000 mg by mouth every 8 (eight) hours as needed for mild pain, fever or headache.      amLODipine (NORVASC) 10 MG tablet Take 1 tablet (10 mg total) by mouth daily. 90 tablet 3   aspirin EC 81 MG tablet Take 81 mg by mouth daily.     carvedilol (COREG) 25 MG tablet Take 1/2 (one-half) tablet by mouth twice daily 180 tablet 0   Cholecalciferol (VITAMIN D-3) 125 MCG (5000 UT) TABS Take 5,000 Units by mouth daily.     Chromium Picolinate 1000 MCG TABS Take  1,000 mg by mouth daily.     empagliflozin (JARDIANCE) 25 MG TABS tablet Take 1 tablet (25 mg total) by mouth daily before breakfast. 90 tablet 3   esomeprazole (NEXIUM) 20 MG capsule Take 1 capsule by mouth daily.     ezetimibe (ZETIA) 10 MG tablet TAKE 1 TABLET BY MOUTH ONCE DAILY AFTER SUPPER 30 tablet 0   fexofenadine (ALLEGRA) 180 MG tablet 1 tablet swallow whole with water; do not take with fruit juices.     Lancets (ONETOUCH DELICA PLUS [id number]) MISC SMARTSIG:1 Topical 3 Times Daily     lidocaine-prilocaine (EMLA) cream Apply to the Port-A-Cath site 30 to 60 minutes before chemotherapy. 30 g 2   metFORMIN (GLUCOPHAGE) 500 MG tablet Take 500 mg by mouth daily after breakfast.      methocarbamol (ROBAXIN) 500 MG tablet Take 1 tablet (500 mg total) by mouth every 6 (six) hours as needed for muscle spasms. 21 tablet 0   nitroGLYCERIN (NITROSTAT) 0.4 MG SL tablet Place 1 tablet (0.4 mg total) under the tongue every 5 (five) minutes as needed for chest pain. 25 tablet 2   ONETOUCH ULTRA test strip USE 1 STRIP TO CHECK GLUCOSE THREE TIMES DAILY     Pitavastatin Calcium (LIVALO) 4 MG TABS Take 4 mg by mouth at bedtime.      telmisartan (MICARDIS) 40 MG tablet Take 1 tablet by mouth daily.     No current facility-administered medications for this visit.    SURGICAL HISTORY:  Past Surgical History:  Procedure Laterality Date   ABDOMINAL HYSTERECTOMY     bone spur     CARDIAC CATHETERIZATION  06/13/2012   carpel tunnel surgery Left    COLONOSCOPY     9 years ago   CORONARY ANGIOPLASTY  06/13/2012   EYE SURGERY     cyst left eye    IR IMAGING GUIDED PORT INSERTION  10/10/2018   LEFT HEART CATHETERIZATION WITH CORONARY ANGIOGRAM N/A 06/13/2012   Procedure: LEFT HEART CATHETERIZATION WITH CORONARY ANGIOGRAM;  Surgeon: Laverda Page, MD;  Location[facility] C[facility] LAB;  Service: Cardiovascular;  Laterality: N/A;   NECK SURGERY     rotator cuff surgery Right 2015   VIDEO BRONCHOSCOPY WITH ENDOBRONCHIAL NAVIGATION N/A 09/06/2018   Procedure: VIDEO BRONCHOSCOPY WITH ENDOBRONCHIAL NAVIGATION, left upper lung;  Surgeon: Lajuana Matte, MD;  Location: [facility];  Service: Thoracic;  Laterality: N/A;   VIDEO BRONCHOSCOPY WITH ENDOBRONCHIAL ULTRASOUND Left 09/06/2018   Procedure: VIDEO BRONCHOSCOPY WITH ENDOBRONCHIAL ULTRASOUND, left lung;  Surgeon: Lajuana Matte, MD;  Location: [facility];  Service: Thoracic;  Laterality: Left;   WISDOM TOOTH EXTRACTION      REVIEW OF SYSTEMS:  A comprehensive review of systems was negative except for: Constitutional: positive for fatigue Respiratory: positive for dyspnea on exertion   PHYSICAL EXAMINATION: General appearance: alert, cooperative,  fatigued, and no distress Head: Normocephalic, without obvious abnormality, atraumatic Neck: no adenopathy, no JVD, supple, symmetrical, trachea midline, and thyroid not enlarged, symmetric, no tenderness/mass/nodules Lymph nodes: Cervical, supraclavicular, and axillary nodes normal. Resp: clear to auscultation bilaterally Back: symmetric, no curvature. ROM normal. No CVA tenderness. Cardio: regular rate and rhythm, S1, S2 normal, no murmur, click, rub or gallop GI: soft, non-tender; bowel sounds normal; no masses,  no organomegaly Extremities: extremities normal, atraumatic, no cyanosis or edema  ECOG PERFORMANCE STATUS: 1 - Symptomatic but completely ambulatory  Blood pressure (!) 119/58, pulse (!) 57, temperature 97.6 F (36.4 C), temperature source Tympanic, resp. rate 16, height 5'  1" (1.549 m), weight 176 lb 6.4 oz (80 kg), SpO2 100 %.  LABORATORY DATA: Lab Results  Component Value Date   WBC 6.1 10/29/2020   HGB 11.3 (L) 10/29/2020   HCT 37.3 10/29/2020   MCV 79.5 (L) 10/29/2020   PLT 257 10/29/2020      Chemistry      Component Value Date/Time   NA 141 10/08/2020 0801   NA 139 08/18/2020 0801   K 4.1 10/08/2020 0801   CL 105 10/08/2020 0801   CO2 25 10/08/2020 0801   BUN 16 10/08/2020 0801   BUN 16 08/18/2020 0801   CREATININE 1.76 (H) 10/08/2020 0801      Component Value Date/Time   CALCIUM 9.2 10/08/2020 0801   ALKPHOS 83 10/08/2020 0801   AST 18 10/08/2020 0801   ALT 15 10/08/2020 0801   BILITOT 0.2 (L) 10/08/2020 0801       RADIOGRAPHIC STUDIES: CT Abdomen Pelvis Wo Contrast  Result Date: 10/08/2020 : CLINICAL DATA:   Lung cancer surveillance. EXAM: CT CHEST, ABDOMEN AND PELVIS WITHOUT CONTRAST TECHNIQUE: Multidetector CT imaging of the chest, abdomen and pelvis was performed following the standard protocol without IV contrast. COMPARISON:   CT 07/15/2020 FINDINGS: CT CHEST FINDINGS Cardiovascular: Coronary artery calcification and aortic atherosclerotic  calcification. Small pericardial effusion unchanged. Mediastinum/Nodes: No axillary or supraclavicular adenopathy. No mediastinal or hilar adenopathy. No pericardial fluid. Esophagus normal. Port in the anterior chest wall with tip in distal SVC. Lungs/Pleura: Nodular focus with associated linear scarring in the LEFT upper lobe measures 7 mm (image 45/series 508) unchanged from 8 mm on most recent CT exam. Sub solid subpleural nodulein the RIGHT lung (image 38) is also unchanged. No new pulmonary nodularity. Musculoskeletal: No aggressive osseous lesion. CT ABDOMEN PELVIS FINDINGS Hepatobiliary: No focal hepatic lesion. No biliary duct dilatation. Common bile duct is normal. Pancreas: Pancreas is normal. No ductal dilatation. No pancreatic inflammation. Spleen: Normal spleen Adrenals/urinary tract: Adrenal glands and kidneys are normal. The ureters and bladder normal. Stomach/Bowel: Stomach, small bowel, appendix, and cecum are normal. The colon and rectosigmoid colon are normal. Vascular/Lymphatic: Abdominal aorta is normal caliber. No periportal or retroperitoneal adenopathy. No pelvic adenopathy. Reproductive: Post hysterectomy.  Adnexa unremarkable Other: No omental peritoneal metastasis Musculoskeletal: No aggressive osseous lesion. IMPRESSION: 1. Stable nodularity and scarring in the lung. No evidence of lung cancer recurrence or disease progression. No interval change from CT 07/15/2020. 2. No evidence of visceral metastasis, nodal metastasis or skeletal metastasis. Electronically Signed   By: Suzy Bouchard M.D.   On: 10/08/2020 09:16   CT Chest Wo Contrast  Result Date: 10/08/2020 CLINICAL DATA:  Lung cancer surveillance. EXAM: CT CHEST, ABDOMEN AND PELVIS WITHOUT CONTRAST TECHNIQUE: Multidetector CT imaging of the chest, abdomen and pelvis was performed following the standard protocol without IV contrast. COMPARISON:  CT 07/15/2020 FINDINGS: CT CHEST FINDINGS Cardiovascular: Coronary artery  calcification and aortic atherosclerotic calcification. Small pericardial effusion unchanged. Mediastinum/Nodes: No axillary or supraclavicular adenopathy. No mediastinal or hilar adenopathy. No pericardial fluid. Esophagus normal. Port in the anterior chest wall with tip in distal SVC. Lungs/Pleura: Nodular focus with associated linear scarring in the LEFT upper lobe measures 7 mm (image 45/series 508) unchanged from 8 mm on most recent CT exam. Sub solid subpleural nodulein the RIGHT lung (image 38) is also unchanged. No new pulmonary nodularity. Musculoskeletal: No aggressive osseous lesion. CT ABDOMEN PELVIS FINDINGS Hepatobiliary: No focal hepatic lesion. No biliary duct dilatation. Common bile duct is normal. Pancreas: Pancreas is normal.  No ductal dilatation. No pancreatic inflammation. Spleen: Normal spleen Adrenals/urinary tract: Adrenal glands and kidneys are normal. The ureters and bladder normal. Stomach/Bowel: Stomach, small bowel, appendix, and cecum are normal. The colon and rectosigmoid colon are normal. Vascular/Lymphatic: Abdominal aorta is normal caliber. No periportal or retroperitoneal adenopathy. No pelvic adenopathy. Reproductive: Post hysterectomy.  Adnexa unremarkable Other: No omental peritoneal metastasis Musculoskeletal: No aggressive osseous lesion. IMPRESSION: 1. Stable nodularity and scarring in the lung. No evidence of lung cancer recurrence or disease progression. No interval change from CT 07/15/2020. 2. No evidence of visceral metastasis, nodal metastasis or skeletal metastasis. Electronically Signed   By: Suzy Bouchard M.D.   On: 10/08/2020 09:01     ASSESSMENT AND PLAN: This is a very pleasant 65 years old African-American female with likely stage IV (T1c, N1, M1C) non-small cell lung cancer, adenocarcinoma with K-ras G12C mutation diagnosed in July 2020 and presented with left upper lobe lung nodule in addition to right upper lobe pulmonary nodule as well as left hilar  adenopathy and metastatic lesion to the brain. Molecular studies showed no actionable mutation and PDL 1 expression was 1%. The patient underwent SBRT to the metastatic brain lesion under the care of Dr. Isidore Moos. The patient is currently undergoing systemic chemotherapy with carboplatin for AUC of 5, Alimta 500 mg/M2 and Keytruda 200 mg IV every 3 weeks is status post 36 cycles.  Starting from cycle #5 she is on maintenance treatment with Alimta and Keytruda every 3 weeks with only Keytruda on the last few cycles because of the renal insufficiency. The patient continues to tolerate her treatment well with no concerning adverse effects except for mild fatigue. I recommended for her to proceed with cycle #37 today as planned. I will see her back for follow-up visit in 3 weeks for evaluation before the next cycle of her treatment. For the hypertension and renal insufficiency, she will continue her routine follow-up visit by her primary care physician and nephrologist. She was advised to call immediately if she has any concerning symptoms in the interval. The patient voices understanding of current disease status and treatment options and is in agreement with the current care plan. All questions were answered. The patient knows to call the clinic with any problems, questions or concerns. We can certainly see the patient much sooner if necessary.  Disclaimer: This note was dictated with voice recognition software. Similar sounding words can inadvertently be transcribed and may not be corrected upon review.

## 2020-10-29 NOTE — Patient Instructions (Signed)
[facility]  Discharge Instructions: Thank you for choosing [facility] to provide your oncology and hematology care.   If you have a lab appointment with the [facility], please go directly to the [facility] and check in at the registration area.   Wear comfortable clothing and clothing appropriate for easy access to any Portacath or PICC line.   We strive to give you quality time with your provider. You may need to reschedule your appointment if you arrive late (15 or more minutes).  Arriving late affects you and other patients whose appointments are after yours.  Also, if you miss three or more appointments without notifying the office, you may be dismissed from the clinic at the provider's discretion.      For prescription refill requests, have your pharmacy contact our office and allow 72 hours for refills to be completed.    Today you received the following chemotherapy and/or immunotherapy agents Pembrolizumab Beryle Flock)      To help prevent nausea and vomiting after your treatment, we encourage you to take your nausea medication as directed.  BELOW ARE SYMPTOMS THAT SHOULD BE REPORTED IMMEDIATELY: *FEVER GREATER THAN 100.4 F (38 C) OR HIGHER *CHILLS OR SWEATING *NAUSEA AND VOMITING THAT IS NOT CONTROLLED WITH YOUR NAUSEA MEDICATION *UNUSUAL SHORTNESS OF BREATH *UNUSUAL BRUISING OR BLEEDING *URINARY PROBLEMS (pain or burning when urinating, or frequent urination) *BOWEL PROBLEMS (unusual diarrhea, constipation, pain near the anus) TENDERNESS IN MOUTH AND THROAT WITH OR WITHOUT PRESENCE OF ULCERS (sore throat, sores in mouth, or a toothache) UNUSUAL RASH, SWELLING OR PAIN  UNUSUAL VAGINAL DISCHARGE OR ITCHING   Items with * indicate a potential emergency and should be followed up as soon as possible or go to the Emergency Department if any problems should occur.  Please show the CHEMOTHERAPY ALERT CARD or IMMUNOTHERAPY ALERT CARD  at check-in to the Emergency Department and triage nurse.  Should you have questions after your visit or need to cancel or reschedule your appointment, please contact [facility]  Dept: [phone]  and follow the prompts.  Office hours are 8:00 a.m. to 4:30 p.m. Monday - Friday. Please note that voicemails left after 4:00 p.m. may not be returned until the following business day.  We are closed weekends and major holidays. You have access to a nurse at all times for urgent questions. Please call the main number to the clinic Dept: [phone] and follow the prompts.   For any non-urgent questions, you may also contact your provider using MyChart. We now offer e-Visits for anyone 67 and older to request care online for non-urgent symptoms. For details visit [email or web address].   Also download the MyChart app! Go to the app store, search "MyChart", open the app, select [facility], and log in with your MyChart username and password.  Due to Covid, a mask is required upon entering the hospital/clinic. If you do not have a mask, one will be given to you upon arrival. For doctor visits, patients may have 1 support person aged 67 or older with them. For treatment visits, patients cannot have anyone with them due to current Covid guidelines and our immunocompromised population.

## 2020-10-31 DIAGNOSIS — J3089 Other allergic rhinitis: Secondary | ICD-10-CM | POA: Diagnosis not present

## 2020-11-10 ENCOUNTER — Ambulatory Visit (INDEPENDENT_AMBULATORY_CARE_PROVIDER_SITE_OTHER): Payer: Medicare Other

## 2020-11-10 ENCOUNTER — Other Ambulatory Visit: Payer: Self-pay

## 2020-11-10 ENCOUNTER — Encounter (HOSPITAL_COMMUNITY): Payer: Self-pay | Admitting: Emergency Medicine

## 2020-11-10 ENCOUNTER — Encounter (HOSPITAL_COMMUNITY): Payer: Self-pay | Admitting: *Deleted

## 2020-11-10 ENCOUNTER — Ambulatory Visit (HOSPITAL_COMMUNITY)
Admission: EM | Admit: 2020-11-10 | Discharge: 2020-11-10 | Disposition: A | Discharge status: Home / Self Care | Payer: Medicare Other | Attending: Student | Admitting: Student

## 2020-11-10 ENCOUNTER — Emergency Department (HOSPITAL_COMMUNITY)
Admission: EM | Admit: 2020-11-10 | Discharge: 2020-11-10 | Disposition: A | Discharge status: Home / Self Care | Payer: Medicare Other | Attending: Emergency Medicine | Admitting: Emergency Medicine

## 2020-11-10 ENCOUNTER — Emergency Department (HOSPITAL_COMMUNITY): Payer: Medicare Other

## 2020-11-10 DIAGNOSIS — I2510 Atherosclerotic heart disease of native coronary artery without angina pectoris: Secondary | ICD-10-CM | POA: Diagnosis not present

## 2020-11-10 DIAGNOSIS — M25512 Pain in left shoulder: Secondary | ICD-10-CM | POA: Diagnosis not present

## 2020-11-10 DIAGNOSIS — Z79899 Other long term (current) drug therapy: Secondary | ICD-10-CM | POA: Diagnosis not present

## 2020-11-10 DIAGNOSIS — Z7984 Long term (current) use of oral hypoglycemic drugs: Secondary | ICD-10-CM | POA: Insufficient documentation

## 2020-11-10 DIAGNOSIS — Z8679 Personal history of other diseases of the circulatory system: Secondary | ICD-10-CM

## 2020-11-10 DIAGNOSIS — I10 Essential (primary) hypertension: Secondary | ICD-10-CM | POA: Insufficient documentation

## 2020-11-10 DIAGNOSIS — Z87891 Personal history of nicotine dependence: Secondary | ICD-10-CM | POA: Diagnosis not present

## 2020-11-10 DIAGNOSIS — M898X1 Other specified disorders of bone, shoulder: Secondary | ICD-10-CM

## 2020-11-10 DIAGNOSIS — J45909 Unspecified asthma, uncomplicated: Secondary | ICD-10-CM | POA: Insufficient documentation

## 2020-11-10 DIAGNOSIS — E1169 Type 2 diabetes mellitus with other specified complication: Secondary | ICD-10-CM | POA: Diagnosis not present

## 2020-11-10 DIAGNOSIS — R0602 Shortness of breath: Secondary | ICD-10-CM | POA: Diagnosis not present

## 2020-11-10 DIAGNOSIS — I700 Atherosclerosis of aorta: Secondary | ICD-10-CM | POA: Diagnosis not present

## 2020-11-10 DIAGNOSIS — R918 Other nonspecific abnormal finding of lung field: Secondary | ICD-10-CM | POA: Diagnosis not present

## 2020-11-10 DIAGNOSIS — Z85118 Personal history of other malignant neoplasm of bronchus and lung: Secondary | ICD-10-CM | POA: Diagnosis not present

## 2020-11-10 DIAGNOSIS — C3492 Malignant neoplasm of unspecified part of left bronchus or lung: Secondary | ICD-10-CM

## 2020-11-10 DIAGNOSIS — Z85841 Personal history of malignant neoplasm of brain: Secondary | ICD-10-CM | POA: Diagnosis not present

## 2020-11-10 DIAGNOSIS — Z7982 Long term (current) use of aspirin: Secondary | ICD-10-CM | POA: Diagnosis not present

## 2020-11-10 DIAGNOSIS — E785 Hyperlipidemia, unspecified: Secondary | ICD-10-CM | POA: Insufficient documentation

## 2020-11-10 LAB — CBC
HCT: 42.2 % (ref 36.0–46.0)
Hemoglobin: 12.3 g/dL (ref 12.0–15.0)
MCH: 23.5 pg — ABNORMAL LOW (ref 26.0–34.0)
MCHC: 29.1 g/dL — ABNORMAL LOW (ref 30.0–36.0)
MCV: 80.7 fL (ref 80.0–100.0)
Platelets: 331 10*3/uL (ref 150–400)
RBC: 5.23 MIL/uL — ABNORMAL HIGH (ref 3.87–5.11)
RDW: 16.1 % — ABNORMAL HIGH (ref 11.5–15.5)
WBC: 7.4 10*3/uL (ref 4.0–10.5)
nRBC: 0 % (ref 0.0–0.2)

## 2020-11-10 LAB — TROPONIN I (HIGH SENSITIVITY)
Troponin I (High Sensitivity): 3 ng/L (ref <18)
Troponin I (High Sensitivity): 3 ng/L (ref <18)

## 2020-11-10 LAB — BASIC METABOLIC PANEL
Anion gap: 9 (ref 5–15)
BUN: 17 mg/dL (ref 8–23)
CO2: 26 mmol/L (ref 22–32)
Calcium: 9.3 mg/dL (ref 8.9–10.3)
Chloride: 105 mmol/L (ref 98–111)
Creatinine, Ser: 1.59 mg/dL — ABNORMAL HIGH (ref 0.44–1.00)
GFR, Estimated: 35 mL/min — ABNORMAL LOW (ref >60)
Glucose, Bld: 89 mg/dL (ref 70–99)
Potassium: 4.4 mmol/L (ref 3.5–5.1)
Sodium: 140 mmol/L (ref 135–145)

## 2020-11-10 LAB — D-DIMER, QUANTITATIVE: D-Dimer, Quant: 0.64 ug/mL-FEU — ABNORMAL HIGH (ref 0.00–0.50)

## 2020-11-10 NOTE — ED Triage Notes (Signed)
Thursday was eating corn chips when got off couch pain under left shoulder blade and felt gassy. Pt reports still having pains. PCP advised to go be seen at [facility] or ED.

## 2020-11-10 NOTE — ED Provider Notes (Signed)
[place]    CSN: [id number] Arrival date & time: 11/10/20  1034      History   Chief Complaint Chief Complaint  Patient presents with   Back Pain    HPI Carolyn [name] is a 65 y.o. female presenting with left shoulder blade pain for 1 day.  Medical history currently active non-small cell lung cancer, pyelonephritis, asthma, CAD, hiatal hernia, hypertension, pneumonia, arthritis.  Here today with shortness of breath, gassiness, left shoulder pain.  States that the scapula hurts at rest, but is worse with movement.  Endorses constipation and gassiness, which is not her baseline.  Denies radiation of pain down arm or jaw, denies chest pain, denies headaches, denies dizziness.  States that her oncologist told her to go to urgent care or the emergency department.  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   Hypertension    nscl ca dx'd 08/07/18   Persistent headaches    h/o   Pneumonia    hx of PNA   Shortness of breath    Vaginal dryness    h/o    Patient Active Problem List   Diagnosis Date Noted   Elevated serum creatinine 04/12/2019   Pyelonephritis 02/08/2019   Port-A-Cath in place 10/19/2018   Encounter for antineoplastic chemotherapy 09/28/2018   Encounter for antineoplastic immunotherapy 09/28/2018   Goals of care, counseling/discussion 09/28/2018   Adenocarcinoma, lung ([place]) 09/12/2018   Brain metastases ([place]) 08/30/2018   Mass of upper lobe of left lung 08/17/2018   Angina pectoris ([place]) 06/14/2012   CAD (coronary artery disease), native coronary artery 06/14/2012   S/P PTCA (percutaneous  transluminal coronary angioplasty) 06/14/2012   Hyperlipidemia 06/14/2012   Essential hypertension 06/14/2012   Pre-diabetes 06/14/2012    Past Surgical History:  Procedure Laterality Date   ABDOMINAL HYSTERECTOMY     bone spur     CARDIAC CATHETERIZATION  06/13/2012   carpel tunnel surgery Left    COLONOSCOPY     9 years ago   CORONARY ANGIOPLASTY  06/13/2012   EYE SURGERY     cyst left eye    IR IMAGING GUIDED PORT INSERTION  10/10/2018   LEFT HEART CATHETERIZATION WITH CORONARY ANGIOGRAM N/A 06/13/2012   Procedure: LEFT HEART CATHETERIZATION WITH CORONARY ANGIOGRAM;  Surgeon: Laverda Page, MD;  Location: [facility] CATH LAB;  Service: Cardiovascular;  Laterality: N/A;   NECK SURGERY     rotator cuff surgery Right 2015   VIDEO BRONCHOSCOPY WITH ENDOBRONCHIAL NAVIGATION N/A 09/06/2018   Procedure: VIDEO BRONCHOSCOPY WITH ENDOBRONCHIAL NAVIGATION, left upper lung;  Surgeon: Lajuana Matte, MD;  Location: [place];  Service: Thoracic;  Laterality: N/A;   VIDEO BRONCHOSCOPY WITH ENDOBRONCHIAL ULTRASOUND Left 09/06/2018   Procedure: VIDEO BRONCHOSCOPY WITH ENDOBRONCHIAL ULTRASOUND, left lung;  Surgeon: Lajuana Matte, MD;  Location: [facility];  Service: Thoracic;  Laterality: Left;   WISDOM TOOTH EXTRACTION      OB History     Gravida  3   Para  Term      Preterm      AB      Living  3      SAB      IAB      Ectopic      Multiple      Live Births               Home Medications    Prior to Admission medications   Medication Sig Start Date End Date Taking? Authorizing Provider  acetaminophen (TYLENOL) 500 MG tablet Take 1,000 mg by mouth every 8 (eight) hours as needed for mild pain, fever or headache.     [provider]  amLODipine (NORVASC) 10 MG tablet Take 1 tablet (10 mg total) by mouth daily. 03/28/20 03/23/21  Adrian Prows, MD  aspirin EC 81 MG tablet Take 81 mg by mouth daily.    [provider]  carvedilol (COREG) 25 MG tablet Take  1/2 (one-half) tablet by mouth twice daily 08/05/20   Adrian Prows, MD  Cholecalciferol (VITAMIN D-3) 125 MCG (5000 UT) TABS Take 5,000 Units by mouth daily.    [provider]  Chromium Picolinate 1000 MCG TABS Take 1,000 mg by mouth daily.    [provider]  empagliflozin (JARDIANCE) 25 MG TABS tablet Take 1 tablet (25 mg total) by mouth daily before breakfast. 10/16/20   Adrian Prows, MD  esomeprazole (NEXIUM) 20 MG capsule Take 1 capsule by mouth daily.    [provider]  ezetimibe (ZETIA) 10 MG tablet TAKE 1 TABLET BY MOUTH ONCE DAILY AFTER SUPPER 10/10/20   Adrian Prows, MD  fexofenadine (ALLEGRA) 180 MG tablet 1 tablet swallow whole with water; do not take with fruit juices. 02/20/20   [provider]  Lancets (ONETOUCH DELICA PLUS ZSWFUX32T) [place] SMARTSIG:1 Topical 3 Times Daily 08/01/19   [provider]  lidocaine-prilocaine (EMLA) cream Apply to the Port-A-Cath site 30 to 60 minutes before chemotherapy. 09/27/19   Heilingoetter, Cassandra L, PA-C  metFORMIN (GLUCOPHAGE) 500 MG tablet Take 500 mg by mouth daily after breakfast.    [provider]  methocarbamol (ROBAXIN) 500 MG tablet Take 1 tablet (500 mg total) by mouth every 6 (six) hours as needed for muscle spasms. 03/25/17   Lily Kocher, PA-C  nitroGLYCERIN (NITROSTAT) 0.4 MG SL tablet Place 1 tablet (0.4 mg total) under the tongue every 5 (five) minutes as needed for chest pain. 03/09/19   Adrian Prows, MD  [facility] ULTRA test strip USE 1 STRIP TO [place] DAILY 08/10/18   [provider]  Pitavastatin Calcium (LIVALO) 4 MG TABS Take 4 mg by mouth at bedtime.     [provider]  telmisartan (MICARDIS) 40 MG tablet Take 1 tablet by mouth daily.    [provider]    Family History Family History  Problem Relation Age of Onset   Breast cancer Other    Diabetes Mother    Glaucoma Mother    Hypertension Mother    Hypertension Father    Asthma  Father    Diabetes Sister    Diabetes Brother    Hypertension Sister     Social History Social History   Tobacco Use   Smoking status: Former    Packs/day: 0.50    Years: 30.00    Pack years: 15.00    Types: Cigarettes    Quit date: 02/09/1999    Years since quitting: 21.7   Smokeless tobacco: Never  Vaping Use  Vaping Use: Never used  Substance Use Topics   Alcohol use: Not Currently   Drug use: Not Currently     Allergies   Atorvastatin, Crestor [rosuvastatin], and Lisinopril   Review of Systems Review of Systems  Constitutional:  Negative for appetite change, chills and fever.  HENT:  Negative for congestion, ear pain, rhinorrhea, sinus pressure, sinus pain and sore throat.   Eyes:  Negative for redness and visual disturbance.  Respiratory:  Positive for shortness of breath. Negative for cough, chest tightness and wheezing.   Cardiovascular:  Negative for chest pain and palpitations.  Gastrointestinal:  Negative for abdominal pain, constipation, diarrhea, nausea and vomiting.  Genitourinary:  Negative for dysuria, frequency and urgency.  Musculoskeletal:  Negative for myalgias.  Neurological:  Negative for dizziness, weakness and headaches.  Psychiatric/Behavioral:  Negative for confusion.   All other systems reviewed and are negative.   Physical Exam Triage Vital Signs ED Triage Vitals  Enc Vitals Group     BP 11/10/20 1148 (!) 158/76     Pulse Rate 11/10/20 1148 70     Resp 11/10/20 1148 17     Temp 11/10/20 1148 98.7 F (37.1 C)     Temp Source 11/10/20 1148 Oral     SpO2 11/10/20 1148 97 %     Weight --      Height --      Head Circumference --      Peak Flow --      Pain Score 11/10/20 1146 8     Pain Loc --      Pain Edu? --      Excl. in [place]? --    No data found.  Updated Vital Signs BP (!) 158/76 (BP Location: Left Arm)   Pulse 70   Temp 98.7 F (37.1 C) (Oral)   Resp 17   SpO2 97%   Visual Acuity Right Eye Distance:   Left Eye  Distance:   Bilateral Distance:    Right Eye Near:   Left Eye Near:    Bilateral Near:     Physical Exam Vitals reviewed.  Constitutional:      Appearance: Normal appearance. She is not diaphoretic.  HENT:     Head: Normocephalic and atraumatic.     Mouth/Throat:     Mouth: Mucous membranes are moist.  Eyes:     Extraocular Movements: Extraocular movements intact.     Pupils: Pupils are equal, round, and reactive to light.  Cardiovascular:     Rate and Rhythm: Normal rate and regular rhythm.     Pulses:          Radial pulses are 2+ on the right side and 2+ on the left side.     Heart sounds: Normal heart sounds.  Pulmonary:     Effort: Pulmonary effort is normal.     Breath sounds: Normal breath sounds.  Abdominal:     Palpations: Abdomen is soft.     Tenderness: There is no abdominal tenderness. There is no guarding or rebound.  Musculoskeletal:     Right lower leg: No edema.     Left lower leg: No edema.     Comments: No reproducible pain of L shoulder or scapula. No pain with abduction L arm.  Skin:    General: Skin is warm.     Capillary Refill: Capillary refill takes less than 2 seconds.  Neurological:     General: No focal deficit present.     Mental Status: She  is alert and oriented to person, place, and time.  Psychiatric:        Mood and Affect: Mood normal.        Behavior: Behavior normal.        Thought Content: Thought content normal.        Judgment: Judgment normal.     UC Treatments / Results  Labs (all labs ordered are listed, but only abnormal results are displayed) Labs Reviewed - No data to display  EKG   Radiology DG Ribs Unilateral W/Chest Left  Result Date: 11/10/2020 CLINICAL DATA:  Left shoulder blade pain EXAM: LEFT RIBS AND CHEST - 3 VIEW COMPARISON:  11/12/2019 FINDINGS: Right chest port with catheter tip in the SVC. No fracture or other bone lesions are seen involving the ribs or left scapula. There is no evidence of pneumothorax  or pleural effusion. Both lungs are clear. Heart size and mediastinal contours are within normal limits. Aortic atherosclerotic calcifications. IMPRESSION: Negative. Electronically Signed   By: Merilyn Baba M.D.   On: 11/10/2020 12:37    Procedures Procedures (including critical care time)  Medications Ordered in UC Medications - No data to display  Initial Impression / Assessment and Plan / UC Course  I have reviewed the triage vital signs and the nursing notes.  Pertinent labs & imaging results that were available during my care of the patient were reviewed by me and considered in my medical decision making (see chart for details).     This patient is a very pleasant 65 y.o. year old female presenting with L shoulder pain, shortness of breath, and 'gassiness'. This patient has currently active non-smallcell lung cancer with brain mets. Also with history CAD.   EKG with nonspecific t wave abnormality, unchanged from 03/2020 EKG.  CXR - no new masses or lesions.  Discussed that while current tests are reassuring cannot exclude cardiac pathology, especially with her medical history. She is amenable to ED visit, husband will drive her straight there.   Final Clinical Impressions(s) / UC Diagnoses   Final diagnoses:  Pain of left scapula  Primary malignant neoplasm of left lung metastatic to other site [facility])  History of coronary artery disease     Discharge Instructions      Please head to the emergency department for further evaluation and management.  With your cardiac history, I cannot rule out an acute coronary event like a early heart attack.  Please head straight there, make sure that your husband drives the vehicle., call [phone] if symptoms get worse on the way.     ED Prescriptions   None    PDMP not reviewed this encounter.   Hazel Sams, PA-C 11/10/20 1307

## 2020-11-10 NOTE — ED Provider Notes (Signed)
Emergency Medicine Provider Triage Evaluation Note  Carolyn [name] , a 65 y.o. female  was evaluated in triage.  Pt complains of left-sided shoulder pain underneath the shoulder blade over the past 4 days.  Symptoms are intermittent.  She started having some shortness of breath yesterday prompting ED visit today.  No history of blood clots.  She has a stent in her heart.  She is undergoing active treatment for lung cancer.  No leg swelling.  Review of Systems  Positive: Shoulder pain, shortness of breath Negative: Fever, cough  Physical Exam  There were no vitals taken for this visit. Gen:   Awake, no distress   Resp:  Normal effort  MSK:   Moves extremities without difficulty  Other:  Lungs CTAB  Medical Decision Making  Medically screening exam initiated at 2:09 PM.  Appropriate orders placed.  [name] was informed that the remainder of the evaluation will be completed by another provider, this initial triage assessment does not replace that evaluation, and the importance of remaining in the ED until their evaluation is complete.  Plan: Labs, EKG, chest x-ray   Carlisle Cater, PA-C 11/10/20 1410    Milton Ferguson, MD 11/15/20 1103

## 2020-11-10 NOTE — Discharge Instructions (Signed)
Follow up with your primary care doctor. Return to the ED with new or worsening symptoms including worsening shortness of breath and chest pain. Take Tylenol and Motrin, alternating every 4 hours as needed for back and shoulder blade pain.

## 2020-11-10 NOTE — ED Provider Notes (Signed)
[facility] EMERGENCY DEPARTMENT Provider Note   CSN: [id number] Arrival date & time: 11/10/20  1305     History Chief Complaint  Patient presents with   Shoulder Pain    Carolyn [name] is a 65 y.o. female.  HPI This is a 65 year old female with history of diabetes, hypertension, hyperlipidemia, CAD with stenting, and non-small cell lung cancer with brain metastases who presents with left shoulder pain.  Pain started 4 days ago, described as sharp, nonradiating, 5 out of 10 in severity, improving, worse with deep inspiration and movements.  Patient denies any falls or other traumatic injuries.  Denies associated chest pain, nausea, diaphoresis, vomiting, or lower extremity swelling.  No cough or congestion or recent fevers.  Does endorse associated shortness of breath.  No history of blood clots and patient is a non-smoker, not on exogenous hormones of any kind.    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   Hypertension    nscl ca dx'd 08/07/18   Persistent headaches    h/o   Pneumonia    hx of PNA   Shortness of breath    Vaginal dryness    h/o    Patient Active Problem List   Diagnosis Date Noted   Elevated serum creatinine 04/12/2019   Pyelonephritis 02/08/2019   Port-A-Cath in place 10/19/2018   Encounter for antineoplastic chemotherapy 09/28/2018   Encounter for antineoplastic immunotherapy 09/28/2018   Goals of care, counseling/discussion 09/28/2018   Adenocarcinoma, lung ([place]) 09/12/2018   Brain metastases ([place]) 08/30/2018   Mass of upper lobe of left lung 08/17/2018   Angina pectoris ([place])  06/14/2012   CAD (coronary artery disease), native coronary artery 06/14/2012   S/P PTCA (percutaneous transluminal coronary angioplasty) 06/14/2012   Hyperlipidemia 06/14/2012   Essential hypertension 06/14/2012   Pre-diabetes 06/14/2012    Past Surgical History:  Procedure Laterality Date   ABDOMINAL HYSTERECTOMY     bone spur     CARDIAC CATHETERIZATION  06/13/2012   carpel tunnel surgery Left    COLONOSCOPY     9 years ago   CORONARY ANGIOPLASTY  06/13/2012   EYE SURGERY     cyst left eye    IR IMAGING GUIDED PORT INSERTION  10/10/2018   LEFT HEART CATHETERIZATION WITH CORONARY ANGIOGRAM N/A 06/13/2012   Procedure: LEFT HEART CATHETERIZATION WITH CORONARY ANGIOGRAM;  Surgeon: Laverda Page, MD;  Location: [facility] CATH LAB;  Service: Cardiovascular;  Laterality: N/A;   NECK SURGERY     rotator cuff surgery Right 2015   VIDEO BRONCHOSCOPY WITH ENDOBRONCHIAL NAVIGATION N/A 09/06/2018   Procedure: VIDEO BRONCHOSCOPY WITH ENDOBRONCHIAL NAVIGATION, left upper lung;  Surgeon: Lajuana Matte, MD;  Location: [place];  Service: Thoracic;  Laterality: N/A;   VIDEO BRONCHOSCOPY WITH ENDOBRONCHIAL ULTRASOUND Left 09/06/2018   Procedure: VIDEO BRONCHOSCOPY WITH ENDOBRONCHIAL ULTRASOUND, left lung;  Surgeon: Lajuana Matte, MD;  Location: [place];  Service: Thoracic;  Laterality: Left;   WISDOM TOOTH EXTRACTION  OB History     Gravida  3   Para      Term      Preterm      AB      Living  3      SAB      IAB      Ectopic      Multiple      Live Births              Family History  Problem Relation Age of Onset   Breast cancer Other    Diabetes Mother    Glaucoma Mother    Hypertension Mother    Hypertension Father    Asthma Father    Diabetes Sister    Diabetes Brother    Hypertension Sister     Social History   Tobacco Use   Smoking status: Former    Packs/day: 0.50    Years: 30.00    Pack years: 15.00    Types: Cigarettes    Quit date:  02/09/1999    Years since quitting: 21.7   Smokeless tobacco: Never  Vaping Use   Vaping Use: Never used  Substance Use Topics   Alcohol use: Not Currently   Drug use: Not Currently    Home Medications Prior to Admission medications   Medication Sig Start Date End Date Taking? Authorizing Provider  acetaminophen (TYLENOL) 500 MG tablet Take 1,000 mg by mouth every 8 (eight) hours as needed for mild pain, fever or headache.     [provider]  amLODipine (NORVASC) 10 MG tablet Take 1 tablet (10 mg total) by mouth daily. 03/28/20 03/23/21  Adrian Prows, MD  aspirin EC 81 MG tablet Take 81 mg by mouth daily.    [provider]  carvedilol (COREG) 25 MG tablet Take 1/2 (one-half) tablet by mouth twice daily 08/05/20   Adrian Prows, MD  Cholecalciferol (VITAMIN D-3) 125 MCG (5000 UT) TABS Take 5,000 Units by mouth daily.    [provider]  Chromium Picolinate 1000 MCG TABS Take 1,000 mg by mouth daily.    [provider]  empagliflozin (JARDIANCE) 25 MG TABS tablet Take 1 tablet (25 mg total) by mouth daily before breakfast. 10/16/20   Adrian Prows, MD  esomeprazole (NEXIUM) 20 MG capsule Take 1 capsule by mouth daily.    [provider]  ezetimibe (ZETIA) 10 MG tablet TAKE 1 TABLET BY MOUTH ONCE DAILY AFTER SUPPER 10/10/20   Adrian Prows, MD  fexofenadine (ALLEGRA) 180 MG tablet 1 tablet swallow whole with water; do not take with fruit juices. 02/20/20   [provider]  Lancets (ONETOUCH DELICA PLUS JJOACZ66A) [place] SMARTSIG:1 Topical 3 Times Daily 08/01/19   [provider]  lidocaine-prilocaine (EMLA) cream Apply to the Port-A-Cath site 30 to 60 minutes before chemotherapy. 09/27/19   Heilingoetter, Cassandra L, PA-C  metFORMIN (GLUCOPHAGE) 500 MG tablet Take 500 mg by mouth daily after breakfast.    [provider]  methocarbamol (ROBAXIN) 500 MG tablet Take 1 tablet (500 mg total) by mouth every 6 (six) hours as needed for muscle spasms.  03/25/17   Lily Kocher, PA-C  nitroGLYCERIN (NITROSTAT) 0.4 MG SL tablet Place 1 tablet (0.4 mg total) under the tongue every 5 (five) minutes as needed for chest pain. 03/09/19   Adrian Prows, MD  [facility] ULTRA test strip USE 1 STRIP TO CHECK GLUCOSE THREE TIMES DAILY 08/10/18   [provider]  Pitavastatin Calcium (LIVALO) 4 MG TABS Take 4  mg by mouth at bedtime.     [provider]  telmisartan (MICARDIS) 40 MG tablet Take 1 tablet by mouth daily.    [provider]    Allergies    Atorvastatin, Crestor [rosuvastatin], and Lisinopril  Review of Systems   Review of Systems  Constitutional:  Negative for chills and fever.  HENT:  Negative for ear pain and sore throat.   Eyes:  Negative for pain and visual disturbance.  Respiratory:  Positive for shortness of breath. Negative for cough.   Cardiovascular:  Negative for chest pain and palpitations.  Gastrointestinal:  Negative for abdominal pain and vomiting.  Genitourinary:  Negative for dysuria and hematuria.  Musculoskeletal:  Positive for back pain. Negative for arthralgias.  Skin:  Negative for color change and rash.  Neurological:  Negative for seizures and syncope.  All other systems reviewed and are negative.  Physical Exam Updated Vital Signs BP (!) 141/69   Pulse 61   Temp 98.4 F (36.9 C) (Oral)   Resp 16   Ht 5' 0.5" (1.537 m)   Wt 79.8 kg   SpO2 98%   BMI 33.81 kg/m   Physical Exam Vitals and nursing note reviewed.  Constitutional:      General: She is not in acute distress.    Appearance: She is well-developed.  HENT:     Head: Normocephalic and atraumatic.  Eyes:     Conjunctiva/sclera: Conjunctivae normal.  Neck:     Comments: No tenderness with palpation of the neck or midline back.  No reproducible left shoulder blade tenderness and no swelling. Cardiovascular:     Rate and Rhythm: Normal rate and regular rhythm.     Heart sounds: No murmur heard. Pulmonary:     Effort:  Pulmonary effort is normal. No respiratory distress.     Breath sounds: Normal breath sounds.  Chest:     Chest wall: No tenderness.  Abdominal:     Palpations: Abdomen is soft.     Tenderness: There is no abdominal tenderness. There is no right CVA tenderness or left CVA tenderness.  Musculoskeletal:        General: No swelling or tenderness.     Cervical back: Neck supple.     Right lower leg: No edema.     Left lower leg: No edema.  Skin:    General: Skin is warm and dry.  Neurological:     Mental Status: She is alert.    ED Results / Procedures / Treatments   Labs (all labs ordered are listed, but only abnormal results are displayed) Labs Reviewed  BASIC METABOLIC PANEL - Abnormal; Notable for the following components:      Result Value   Creatinine, Ser 1.59 (*)    GFR, Estimated 35 (*)    All other components within normal limits  CBC - Abnormal; Notable for the following components:   RBC 5.23 (*)    MCH 23.5 (*)    MCHC 29.1 (*)    RDW 16.1 (*)    All other components within normal limits  D-DIMER, QUANTITATIVE - Abnormal; Notable for the following components:   D-Dimer, Quant 0.64 (*)    All other components within normal limits  TROPONIN I (HIGH SENSITIVITY)  TROPONIN I (HIGH SENSITIVITY)    EKG None  Radiology DG Chest 2 View  Result Date: 11/10/2020 CLINICAL DATA:  Chest and shoulder pain in the left posterior region near the scapula. Pain for 2 days. History of therapy including  chemotherapy for left upper lung carcinoma. EXAM: CHEST - 2 VIEW COMPARISON:  11/10/2020 at 11:56 a.m. FINDINGS: Cardiac silhouette is normal in size. Normal mediastinal and hilar contours. Right anterior chest wall Port-A-Cath is stable, tip in the lower superior vena cava. Reticular opacities noted in the left upper lobe faint likely scarring from treatment of previous lung carcinoma. There is blunting of the left lateral costophrenic sulcus consistent with pleural thickening.  Remainder of the lungs is clear. No convincing pleural effusion and no pneumothorax. Skeletal structures are intact. IMPRESSION: No acute cardiopulmonary disease. Electronically Signed   By: Lajean Manes M.D.   On: 11/10/2020 15:05   DG Ribs Unilateral W/Chest Left  Result Date: 11/10/2020 CLINICAL DATA:  Left shoulder blade pain EXAM: LEFT RIBS AND CHEST - 3 VIEW COMPARISON:  11/12/2019 FINDINGS: Right chest port with catheter tip in the SVC. No fracture or other bone lesions are seen involving the ribs or left scapula. There is no evidence of pneumothorax or pleural effusion. Both lungs are clear. Heart size and mediastinal contours are within normal limits. Aortic atherosclerotic calcifications. IMPRESSION: Negative. Electronically Signed   By: Merilyn Baba M.D.   On: 11/10/2020 12:37    Procedures Procedures   Medications Ordered in ED Medications - No data to display  ED Course  I have reviewed the triage vital signs and the nursing notes.  Pertinent labs & imaging results that were available during my care of the patient were reviewed by me and considered in my medical decision making (see chart for details).    MDM Rules/Calculators/A&P                          Patient is stable and well-appearing.  Items on the differential for pain in the left shoulder blade include ACS, pneumonia, pneumothorax, PE, gallbladder disease, and musculoskeletal pain.  EKG without evidence of acute ischemia or arrhythmia.  Chest x-ray without any cardiopulmonary abnormalities including pneumonia or pneumothorax.  Age-adjusted D-dimer is negative.  Initial troponin negative at 3.  Pain has been going on for 4 days, no need to repeat troponin.  Low suspicion for gallbladder disease given patient has no abdominal pain and specifically no right upper quadrant tenderness.  Patient is stable for discharge home with PCP follow-up.  Advised Tylenol and Motrin alternating every 4 hours for pain.  Patient expressed  understanding.  Strict return precautions were given including chest pain or shortness of breath.  Discharged home in stable condition.  Final Clinical Impression(s) / ED Diagnoses Final diagnoses:  Acute pain of left shoulder    Rx / DC Orders ED Discharge Orders     None        Coralee Pesa, MD 11/11/20 Phillip Heal    Noemi Chapel, MD 11/12/20 [phone]

## 2020-11-10 NOTE — Discharge Instructions (Addendum)
Please head to the emergency department for further evaluation and management.  With your cardiac history, I cannot rule out an acute coronary event like a early heart attack.  Please head straight there, make sure that your husband drives the vehicle., call 911 if symptoms get worse on the way.

## 2020-11-10 NOTE — ED Triage Notes (Signed)
Patient to ED via POV  c/o left scapula pain onset Thurs. States it felt like gas used Mylanta states she felt a little better on Fri./Sat sun c/o sob. Went to [facility] was sent to ED for further eval. Stats she has a hx. Of cardiac stent and lung cancer.

## 2020-11-10 NOTE — ED Notes (Signed)
RN reviewed discharge instructions w/ pt. Follow up and pain management reviewed, pt had no further questions.

## 2020-11-10 NOTE — ED Provider Notes (Signed)
I saw and evaluated the patient, reviewed the resident's note and I agree with the findings and plan.  Pertinent History: This patient has had several days of pain underneath her left shoulder blade, she denies coughing fevers or swelling of the legs.  Her vital signs are unremarkable, abdomen is soft and nontender, there is no right upper or left upper quadrant tenderness.  EKG is unremarkable, labs are unremarkable, D-dimer has been added to the labs that have already been drawn.  Troponin was 3 and essentially undetectable.  Doubt acute coronary syndrome, chest x-ray unremarkable.  Patient agreeable to continued work-up if D-dimer is elevated.  That being said she is asymptomatic at this time I was personally present and directly supervised the following procedures:  Medical evaluation  I personally interpreted the EKG as well as the resident and agree with the interpretation on the resident's chart.  Final diagnoses:  None      Noemi Chapel, MD 11/12/20 1157

## 2020-11-12 ENCOUNTER — Other Ambulatory Visit: Payer: Self-pay | Admitting: Cardiology

## 2020-11-12 DIAGNOSIS — E7800 Pure hypercholesterolemia, unspecified: Secondary | ICD-10-CM

## 2020-11-13 NOTE — Progress Notes (Signed)
[facility] OFFICE PROGRESS NOTE  [name], [place] [address]  DIAGNOSIS: Stage IV (T1c, N1, M1c ) non-small cell lung cancer, adenocarcinoma diagnosed in July 2020 and presented with left upper lobe lung nodule in addition to right upper lobe lung nodule with left hilar lymphadenopathy as well as multiple brain metastasis.   Biomarker Findings Microsatellite status - MS-Stable Tumor Mutational Burden - 4 Muts/Mb Genomic Findings For a complete list of the genes assayed, please refer to the Appendix. ATM G204* KRAS G12C PDGFRA P122T SMO R199W 7 Disease relevant genes with no reportable alterations: ALK, BRAF, EGFR, ERBB2, MET, RET, ROS1   PDL 1 expression was 1%  PRIOR THERAPY: SBRT to multiple brain metastasis under the care of Dr. Isidore Moos. Completed in August 2020  CURRENT THERAPY:  Systemic chemotherapy with carboplatin for AUC of 5, Alimta 500 mg/M2 and Keytruda 200 mg IV every 3 weeks.  First dose October 05, 2018.  Status post 37 cycles.  Starting from cycle #5 the patient will be on treatment with maintenance Alimta 500 mg/M2 and Keytruda 200 mg IV every 3 weeks.  She has been on treatment with single agent Keytruda secondary to renal insufficiency    INTERVAL HISTORY: Carolyn [name] 65 y.o. female returns to the clinic today for a follow up visit. The patient is feeling fairly well today without any concerning complaints. In the interval since her last appointment, she presented to the ER on 11/10/20 for the chief complaint for left shoulder pain/shoulder blade pain. Her troponin was negative and age adjusted D-dimer was noted to negative. Her symptoms resolved in the ER and she was discharged to follow up with her PCP. Since this time, her symptoms have resolved. Denied falls or injuries. The patient believes this was attributed to gas pain.   Otherwise, Her chemotherapy with alimta was discontinued due to renal  insuffiencey for which she is followed by nephrology. She has a history of diabetes as well as hypertension. She tolerated her last cycle of treatment with single agent immunotherapy with Keytruda well without any concerning adverse side effects. The patient denies any recent fever, chills, or weight loss. She reports baseline night sweats. She denies any chest pain, shortness of breath, cough, or hemoptysis.  She denies any nausea, vomiting, diarrhea, or constipation.  She denies any rashes or skin changes.  She denies any visual changes. She has baseline occasional headaches but nothing unusual. She is scheduled for a repeat brain MRI on 12/05/20. She follows with neuro-oncology. 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  MEDICATIONS:  Current Outpatient Medications  Medication Sig Dispense Refill   acetaminophen (TYLENOL) 500 MG tablet Take 1,000 mg by mouth every 8 (eight) hours as needed for mild pain, fever or headache.      amLODipine (NORVASC) 10 MG tablet Take 1 tablet (10 mg total) by mouth daily. 90 tablet 3   aspirin EC 81 MG tablet Take 81 mg by mouth daily.     carvedilol (COREG) 25 MG tablet Take 1/2 (one-half) tablet by mouth twice daily 180 tablet 0   Cholecalciferol (VITAMIN D-3) 125 MCG (5000 UT) TABS Take 5,000 Units  by mouth daily.     Chromium Picolinate 1000 MCG TABS Take 1,000 mg by mouth daily.     empagliflozin (JARDIANCE) 25 MG TABS tablet Take 1 tablet (25 mg total) by mouth daily before breakfast. 90 tablet 3   esomeprazole (NEXIUM) 20 MG capsule Take 1 capsule by mouth daily.     ezetimibe (ZETIA) 10 MG tablet TAKE 1 TABLET BY MOUTH ONCE DAILY AFTER SUPPER 90 tablet 3   fexofenadine (ALLEGRA) 180 MG tablet 1 tablet swallow whole with water; do not take with fruit juices.     Lancets (ONETOUCH DELICA PLUS [id number]) MISC SMARTSIG:1 Topical 3 Times Daily     lidocaine-prilocaine (EMLA) cream Apply to the Port-A-Cath site 30 to 60 minutes before chemotherapy. 30 g 2   metFORMIN (GLUCOPHAGE) 500 MG tablet Take 500 mg by mouth daily after breakfast.     methocarbamol (ROBAXIN) 500 MG tablet Take 1 tablet (500 mg total) by mouth every 6 (six) hours as needed for muscle spasms. 21 tablet 0   nitroGLYCERIN (NITROSTAT) 0.4 MG SL tablet Place 1 tablet (0.4 mg total) under the tongue every 5 (five) minutes as needed for chest pain. 25 tablet 2   ONETOUCH ULTRA test strip USE 1 STRIP TO CHECK GLUCOSE THREE TIMES DAILY     Pitavastatin Calcium (LIVALO) 4 MG TABS Take 4 mg by mouth at bedtime.      telmisartan (MICARDIS) 40 MG tablet Take 1 tablet by mouth daily.     No current facility-administered medications for this visit.    SURGICAL HISTORY:  Past Surgical History:  Procedure Laterality Date   ABDOMINAL HYSTERECTOMY     bone spur     CARDIAC CATHETERIZATION  06/13/2012   carpel tunnel surgery Left    COLONOSCOPY     9 years ago   CORONARY ANGIOPLASTY  06/13/2012   EYE SURGERY     cyst left eye    IR IMAGING GUIDED PORT INSERTION  10/10/2018   LEFT HEART CATHETERIZATION WITH CORONARY ANGIOGRAM N/A 06/13/2012   Procedure: LEFT HEART CATHETERIZATION WITH CORONARY ANGIOGRAM;  Surgeon: Laverda Page, MD;  Location: [facility] CATH LAB;  Service: Cardiovascular;  Laterality: N/A;   NECK SURGERY     rotator  cuff surgery Right 2015   VIDEO BRONCHOSCOPY WITH ENDOBRONCHIAL NAVIGATION N/A 09/06/2018   Procedure: VIDEO BRONCHOSCOPY WITH ENDOBRONCHIAL NAVIGATION, left upper lung;  Surgeon: Lajuana Matte, MD;  Location: [facility];  Service: Thoracic;  Laterality: N/A;   VIDEO BRONCHOSCOPY WITH ENDOBRONCHIAL ULTRASOUND Left 09/06/2018   Procedure: VIDEO BRONCHOSCOPY WITH ENDOBRONCHIAL ULTRASOUND, left lung;  Surgeon: Lajuana Matte, MD;  Location: [facility];  Service: Thoracic;  Laterality: Left;   WISDOM TOOTH EXTRACTION      REVIEW OF SYSTEMS:   Constitutional: Negative for appetite change, chills, fatigue, fever and unexpected weight change.  HENT: Negative for mouth sores, nosebleeds, sore throat  and trouble swallowing.   Eyes: Negative for eye problems and icterus.  Respiratory: Negative for cough, hemoptysis, shortness of breath and wheezing.   Cardiovascular: Negative for chest pain and leg swelling.  Gastrointestinal: Negative for abdominal pain, constipation, diarrhea, nausea and vomiting.  Genitourinary: Negative for bladder incontinence, difficulty urinating, dysuria, frequency and hematuria.   Musculoskeletal: Negative for back pain, gait problem, neck pain and neck stiffness.  Skin: Negative for itching and rash.  Neurological: Positive for occasional headaches. Negative for dizziness, extremity weakness, gait problem, headaches, light-headedness and seizures.  Hematological: Negative for adenopathy. Does not bruise/bleed easily.  Psychiatric/Behavioral: Negative for confusion, depression and sleep disturbance. The patient is not nervous/anxious.   PHYSICAL EXAMINATION:  Blood pressure 135/71, pulse (!) 58, temperature 98.1 F (36.7 C), temperature source Oral, resp. rate 17, weight 176 lb 12.8 oz (80.2 kg), SpO2 100 %.  ECOG PERFORMANCE STATUS: 1  Physical Exam  Constitutional: Oriented to person, place, and time and well-developed, well-nourished, and in no distress.   HENT:   Head: Normocephalic and atraumatic.  Mouth/Throat: Oropharynx is clear and moist. No oropharyngeal exudate.  Eyes: Conjunctivae are normal. Right eye exhibits no discharge. Left eye exhibits no discharge. No scleral icterus.  Neck: Normal range of motion. Neck supple.  Cardiovascular: Normal rate, regular rhythm, normal heart sounds and intact distal pulses.   Pulmonary/Chest: Effort normal and breath sounds normal. No respiratory distress. No wheezes. No rales.  Abdominal: Soft. Bowel sounds are normal. Exhibits no distension and no mass. There is no tenderness.  Musculoskeletal: Normal range of motion. Exhibits no edema. Full ROM of the left shoulder.  Lymphadenopathy:    No cervical adenopathy.  Neurological: Alert and oriented to person, place, and time. Exhibits normal muscle tone. Gait normal. Coordination normal.  Skin: Skin is warm and dry. No rash noted. Not diaphoretic. No erythema. No pallor.  Psychiatric: Mood, memory and judgment normal.  Vitals reviewed.  LABORATORY DATA: Lab Results  Component Value Date   WBC 6.4 11/20/2020   HGB 11.2 (L) 11/20/2020   HCT 36.7 11/20/2020   MCV 78.6 (L) 11/20/2020   PLT 293 11/20/2020      Chemistry      Component Value Date/Time   NA 140 11/10/2020 1414   NA 139 08/18/2020 0801   K 4.4 11/10/2020 1414   CL 105 11/10/2020 1414   CO2 26 11/10/2020 1414   BUN 17 11/10/2020 1414   BUN 16 08/18/2020 0801   CREATININE 1.59 (H) 11/10/2020 1414   CREATININE 1.90 (H) 10/29/2020 0848      Component Value Date/Time   CALCIUM 9.3 11/10/2020 1414   ALKPHOS 83 10/29/2020 0848   AST 18 10/29/2020 0848   ALT 11 10/29/2020 0848   BILITOT 0.3 10/29/2020 0848       RADIOGRAPHIC STUDIES:  DG Chest 2 View  Result Date: 11/10/2020 CLINICAL DATA:  Chest and shoulder pain in the left posterior region near the scapula. Pain for 2 days. History of therapy including chemotherapy for left upper lung carcinoma. EXAM: CHEST - 2 VIEW  COMPARISON:  11/10/2020 at 11:56 a.m. FINDINGS: Cardiac silhouette is normal in size. Normal mediastinal and hilar contours. Right anterior chest wall Port-A-Cath is stable, tip in the lower superior vena cava. Reticular opacities noted in the left upper lobe faint likely scarring from treatment of previous lung carcinoma. There is blunting of the left lateral costophrenic sulcus consistent with pleural thickening. Remainder of the lungs is clear. No convincing pleural effusion and no  pneumothorax. Skeletal structures are intact. IMPRESSION: No acute cardiopulmonary disease. Electronically Signed   By: Lajean Manes M.D.   On: 11/10/2020 15:05   DG Ribs Unilateral W/Chest Left  Result Date: 11/10/2020 CLINICAL DATA:  Left shoulder blade pain EXAM: LEFT RIBS AND CHEST - 3 VIEW COMPARISON:  11/12/2019 FINDINGS: Right chest port with catheter tip in the SVC. No fracture or other bone lesions are seen involving the ribs or left scapula. There is no evidence of pneumothorax or pleural effusion. Both lungs are clear. Heart size and mediastinal contours are within normal limits. Aortic atherosclerotic calcifications. IMPRESSION: Negative. Electronically Signed   By: Merilyn Baba M.D.   On: 11/10/2020 12:37     ASSESSMENT/PLAN:  This is a very pleasant 65 year old African-American female with stage IV (T1c, N1, M1c) non-small cell lung cancer, adenocarcinoma. She presented with a dominant left upper lobe lung nodule in addition to a right upper lobe pulmonary nodule with left hilar lymphadenopathy. She also has metastatic disease to the brain. She has no actionable mutations and her PDL1 expression is 1%. She was diagnosed in July 2020. She also has KRASG12C which is a targetable mutation that can be used in the second line setting.    The patient completed SBRT to the metastatic brain lesion under the care of Dr. Isidore Moos in August 2020  The patient is currently undergoing systemic chemotherapy with carboplatin  for an AUC of 5, Alimta 500 mg/m, and Keytruda 200 mg IV every 3 weeks.  She is status post 37 cycles of treatment.  Starting from cycle #5, she has been on maintenance Alimta 500 mg/m2 and Keytruda 200 mg IV. She tolerated her treatment well without any concerning adverse side effects. Alimta was discontinued due to renal insufficiency.    Labs were reviewed. Her CMP is pending. Recommend that she proceed with cycle #38 today as scheduled as long as her CMP is within parameters. She has baseline CKD. As long as her creatinine is near baseline, she may proceed with treatment as scheduled.  Given the recent ER visit, we will arrange for a restaging CT scan prior to her next cycle of treatment. I will order without contrast due to her renal insufficiency.    We will see her back for a follow up visit in 3 weeks for evaluation and to review her scans before starting cycle #39.    She will have her MRI as scheduled on 12/05/20.   The patient was advised to call immediately if she has any concerning symptoms in the interval. The patient voices understanding of current disease status and treatment options and is in agreement with the current care plan. All questions were answered. The patient knows to call the clinic with any problems, questions or concerns. We can certainly see the patient much sooner if necessary        Orders Placed This Encounter  Procedures   CT Chest Wo Contrast    Standing Status:   Future    Standing Expiration Date:   11/20/2021    Order Specific Question:   Preferred imaging location?    Answer:   [facility]   CT Abdomen Pelvis Wo Contrast    Standing Status:   Future    Standing Expiration Date:   11/20/2021    Order Specific Question:   Preferred imaging location?    Answer:   [facility]    Order Specific Question:   Is Oral Contrast requested for this exam?  Answer:   Yes, Per Radiology protocol   TSH    Standing Status:   Standing     Number of Occurrences:   10    Standing Expiration Date:   11/20/2021      The total time spent in the appointment was 20-29 minutes.   Cassandra L Heilingoetter, PA-C 11/20/20

## 2020-11-20 ENCOUNTER — Inpatient Hospital Stay: Payer: Medicare Other

## 2020-11-20 ENCOUNTER — Other Ambulatory Visit: Payer: Self-pay

## 2020-11-20 ENCOUNTER — Ambulatory Visit: Payer: Medicare Other | Admitting: Internal Medicine

## 2020-11-20 ENCOUNTER — Ambulatory Visit: Payer: Medicare Other

## 2020-11-20 ENCOUNTER — Inpatient Hospital Stay (HOSPITAL_BASED_OUTPATIENT_CLINIC_OR_DEPARTMENT_OTHER): Payer: Medicare Other | Admitting: Physician Assistant

## 2020-11-20 ENCOUNTER — Inpatient Hospital Stay: Payer: Medicare Other | Attending: Physician Assistant

## 2020-11-20 ENCOUNTER — Other Ambulatory Visit: Payer: Medicare Other

## 2020-11-20 VITALS — BP 135/71 | HR 58 | Temp 98.1°F | Resp 17 | Wt 176.8 lb

## 2020-11-20 DIAGNOSIS — Z5112 Encounter for antineoplastic immunotherapy: Secondary | ICD-10-CM | POA: Diagnosis not present

## 2020-11-20 DIAGNOSIS — C3491 Malignant neoplasm of unspecified part of right bronchus or lung: Secondary | ICD-10-CM | POA: Diagnosis not present

## 2020-11-20 DIAGNOSIS — C7931 Secondary malignant neoplasm of brain: Secondary | ICD-10-CM | POA: Insufficient documentation

## 2020-11-20 DIAGNOSIS — Z79899 Other long term (current) drug therapy: Secondary | ICD-10-CM | POA: Insufficient documentation

## 2020-11-20 DIAGNOSIS — C3412 Malignant neoplasm of upper lobe, left bronchus or lung: Secondary | ICD-10-CM | POA: Insufficient documentation

## 2020-11-20 DIAGNOSIS — R5383 Other fatigue: Secondary | ICD-10-CM

## 2020-11-20 DIAGNOSIS — C3492 Malignant neoplasm of unspecified part of left bronchus or lung: Secondary | ICD-10-CM

## 2020-11-20 DIAGNOSIS — Z95828 Presence of other vascular implants and grafts: Secondary | ICD-10-CM

## 2020-11-20 LAB — CMP (CANCER CENTER ONLY)
ALT: 14 U/L (ref 0–44)
AST: 23 U/L (ref 15–41)
Albumin: 3.6 g/dL (ref 3.5–5.0)
Alkaline Phosphatase: 81 U/L (ref 38–126)
Anion gap: 7 (ref 5–15)
BUN: 17 mg/dL (ref 8–23)
CO2: 26 mmol/L (ref 22–32)
Calcium: 9 mg/dL (ref 8.9–10.3)
Chloride: 105 mmol/L (ref 98–111)
Creatinine: 1.53 mg/dL — ABNORMAL HIGH (ref 0.44–1.00)
GFR, Estimated: 37 mL/min — ABNORMAL LOW (ref >60)
Glucose, Bld: 118 mg/dL — ABNORMAL HIGH (ref 70–99)
Potassium: 4.1 mmol/L (ref 3.5–5.1)
Sodium: 138 mmol/L (ref 135–145)
Total Bilirubin: 0.6 mg/dL (ref 0.3–1.2)
Total Protein: 7.3 g/dL (ref 6.5–8.1)

## 2020-11-20 LAB — CBC WITH DIFFERENTIAL (CANCER CENTER ONLY)
Abs Immature Granulocytes: 0.01 10*3/uL (ref 0.00–0.07)
Basophils Absolute: 0 10*3/uL (ref 0.0–0.1)
Basophils Relative: 1 %
Eosinophils Absolute: 0.4 10*3/uL (ref 0.0–0.5)
Eosinophils Relative: 6 %
HCT: 36.7 % (ref 36.0–46.0)
Hemoglobin: 11.2 g/dL — ABNORMAL LOW (ref 12.0–15.0)
Immature Granulocytes: 0 %
Lymphocytes Relative: 19 %
Lymphs Abs: 1.2 10*3/uL (ref 0.7–4.0)
MCH: 24 pg — ABNORMAL LOW (ref 26.0–34.0)
MCHC: 30.5 g/dL (ref 30.0–36.0)
MCV: 78.6 fL — ABNORMAL LOW (ref 80.0–100.0)
Monocytes Absolute: 0.6 10*3/uL (ref 0.1–1.0)
Monocytes Relative: 10 %
Neutro Abs: 4.2 10*3/uL (ref 1.7–7.7)
Neutrophils Relative %: 64 %
Platelet Count: 293 10*3/uL (ref 150–400)
RBC: 4.67 MIL/uL (ref 3.87–5.11)
RDW: 16.3 % — ABNORMAL HIGH (ref 11.5–15.5)
WBC Count: 6.4 10*3/uL (ref 4.0–10.5)
nRBC: 0 % (ref 0.0–0.2)

## 2020-11-20 MED ORDER — SODIUM CHLORIDE 0.9% FLUSH
10.0000 mL | INTRAVENOUS | Status: DC | PRN
  Administered 2020-11-20: 10 mL

## 2020-11-20 MED ORDER — SODIUM CHLORIDE 0.9 % IV SOLN
200.0000 mg | Freq: Once | INTRAVENOUS | Status: AC
  Administered 2020-11-20: 200 mg via INTRAVENOUS
  Filled 2020-11-20: qty 8

## 2020-11-20 MED ORDER — SODIUM CHLORIDE 0.9 % IV SOLN: Freq: Once | INTRAVENOUS | Status: AC

## 2020-11-20 MED ORDER — HEPARIN SOD (PORK) LOCK FLUSH 100 UNIT/ML IV SOLN
500.0000 [IU] | Freq: Once | INTRAVENOUS | Status: AC | PRN
  Administered 2020-11-20: 500 [IU]

## 2020-11-20 NOTE — Patient Instructions (Signed)
[facility]  Discharge Instructions: Thank you for choosing [facility] to provide your oncology and hematology care.   If you have a lab appointment with the [facility], please go directly to the [facility] and check in at the registration area.   Wear comfortable clothing and clothing appropriate for easy access to any Portacath or PICC line.   We strive to give you quality time with your provider. You may need to reschedule your appointment if you arrive late (15 or more minutes).  Arriving late affects you and other patients whose appointments are after yours.  Also, if you miss three or more appointments without notifying the office, you may be dismissed from the clinic at the provider's discretion.      For prescription refill requests, have your pharmacy contact our office and allow 72 hours for refills to be completed.    Today you received the following chemotherapy and/or immunotherapy agents Pembrolizumab      To help prevent nausea and vomiting after your treatment, we encourage you to take your nausea medication as directed.  BELOW ARE SYMPTOMS THAT SHOULD BE REPORTED IMMEDIATELY: *FEVER GREATER THAN 100.4 F (38 C) OR HIGHER *CHILLS OR SWEATING *NAUSEA AND VOMITING THAT IS NOT CONTROLLED WITH YOUR NAUSEA MEDICATION *UNUSUAL SHORTNESS OF BREATH *UNUSUAL BRUISING OR BLEEDING *URINARY PROBLEMS (pain or burning when urinating, or frequent urination) *BOWEL PROBLEMS (unusual diarrhea, constipation, pain near the anus) TENDERNESS IN MOUTH AND THROAT WITH OR WITHOUT PRESENCE OF ULCERS (sore throat, sores in mouth, or a toothache) UNUSUAL RASH, SWELLING OR PAIN  UNUSUAL VAGINAL DISCHARGE OR ITCHING   Items with * indicate a potential emergency and should be followed up as soon as possible or go to the Emergency Department if any problems should occur.  Please show the CHEMOTHERAPY ALERT CARD or IMMUNOTHERAPY ALERT CARD at check-in  to the Emergency Department and triage nurse.  Should you have questions after your visit or need to cancel or reschedule your appointment, please contact [facility]  Dept: [phone]  and follow the prompts.  Office hours are 8:00 a.m. to 4:30 p.m. Monday - Friday. Please note that voicemails left after 4:00 p.m. may not be returned until the following business day.  We are closed weekends and major holidays. You have access to a nurse at all times for urgent questions. Please call the main number to the clinic Dept: [phone] and follow the prompts.   For any non-urgent questions, you may also contact your provider using MyChart. We now offer e-Visits for anyone 62 and older to request care online for non-urgent symptoms. For details visit [email or web address].   Also download the MyChart app! Go to the app store, search "MyChart", open the app, select [facility], and log in with your MyChart username and password.  Due to Covid, a mask is required upon entering the hospital/clinic. If you do not have a mask, one will be given to you upon arrival. For doctor visits, patients may have 1 support person aged 87 or older with them. For treatment visits, patients cannot have anyone with them due to current Covid guidelines and our immunocompromised population.

## 2020-11-20 NOTE — Progress Notes (Signed)
Per Dr. Julien Nordmann, "OK To Treat w/elevated Cr+".

## 2020-11-21 ENCOUNTER — Telehealth: Payer: Self-pay | Admitting: Physician Assistant

## 2020-11-21 NOTE — Telephone Encounter (Signed)
Scheduled follow-up appointment per 10/13 los. Patient is aware.

## 2020-11-28 DIAGNOSIS — Z1231 Encounter for screening mammogram for malignant neoplasm of breast: Secondary | ICD-10-CM | POA: Diagnosis not present

## 2020-12-05 ENCOUNTER — Ambulatory Visit
Admission: RE | Admit: 2020-12-05 | Discharge: 2020-12-05 | Disposition: A | Discharge status: Home / Self Care | Payer: Medicare Other | Source: Ambulatory Visit | Attending: Internal Medicine | Admitting: Internal Medicine

## 2020-12-05 DIAGNOSIS — G9389 Other specified disorders of brain: Secondary | ICD-10-CM | POA: Diagnosis not present

## 2020-12-05 DIAGNOSIS — C7931 Secondary malignant neoplasm of brain: Secondary | ICD-10-CM

## 2020-12-05 DIAGNOSIS — C3490 Malignant neoplasm of unspecified part of unspecified bronchus or lung: Secondary | ICD-10-CM | POA: Diagnosis not present

## 2020-12-05 DIAGNOSIS — J3489 Other specified disorders of nose and nasal sinuses: Secondary | ICD-10-CM | POA: Diagnosis not present

## 2020-12-05 MED ORDER — GADOBENATE DIMEGLUMINE 529 MG/ML IV SOLN
15.0000 mL | Freq: Once | INTRAVENOUS | Status: AC | PRN
  Administered 2020-12-05: 15 mL via INTRAVENOUS

## 2020-12-08 NOTE — Progress Notes (Signed)
Location/Histology of Brain Tumor:  MRI Brain w/ & w/o Contrast 12/05/2020 --IMPRESSION: Increase in size of left temporal metastasis. New punctate enhancement right frontal lobe could reflect a new metastasis. Increased prominence of enhancement at the genu of the left facial nerve also present on recent prior studies. Likely unrelated and may reflect a schwannoma.  Dose of Decadron, if applicable: Not currently prescribed  Recent neurologic symptoms, if any:  Seizures: Patient denies Headaches: Patient denies Nausea: Patient denies Dizziness/ataxia: Reports occasionally feeling off balance (denies any falls) Difficulty with hand coordination: Patient denies Focal numbness/weakness: Patient denies Visual deficits/changes: Patient denies Confusion/Memory deficits: Patient denies any new or worsening concerns  SAFETY ISSUES: Prior radiation? Yes 09/02/2020 Site Technique Total Dose (Gy) Dose per Fx (Gy) Completed Fx Beam Energies  Brain: Brain_SRS 3D 20/20 20 1/1 [id number]   Pacemaker/ICD? No Possible current pregnancy? No--hysterectomy Is the patient on methotrexate? No  Additional Complaints / other details: Had CT C/A/P w/o contrast this morning. Reports her PCP prescribed her amoxicillin for congestion/possible URI (she will complete course Thursday 12/11/20)

## 2020-12-08 NOTE — Progress Notes (Signed)
Radiation Oncology         [phone] ________________________________  Outpatient Re-Evaluation  Name: Carolyn [name] MRN: [id number]  Date: 12/09/2020  DOB: 1955/03/07  GB:EEFEOFH, [place] Reader, FNP  Curt Bears, MD   REFERRING PHYSICIAN: Curt Bears, MD  DIAGNOSIS:     ICD-10-CM   1. Brain metastases ([place])  C79.31      New evidence of brain met on recent MRI  Stage IV (T1c, N1, M1c ) non-small cell lung cancer, adenocarcinoma diagnosed in July 2020    CHIEF COMPLAINT: Here for follow-up/ re-evaluation of brain metastasis   Narrative:  The patient returns today for re-evaluation of brain metastasis, she was last seen here for follow-up on 10/03/20.   --MRI of the brain with and without contrast on 12/05/20 demonstrated an increase in size of the left temporal metastasis, and a new punctate enhancement right frontal lobe possibly reflecting a new metastasis. Of note:  increased prominence of enhancement at the genu of the left facial nerve also present on recent prior studies, was noted as likely unrelated and possibly reflective of a schwannoma.  Her MRI was discussed at tumor board earlier this week.  The consensus was that the left temporal metastasis demonstrated treatment effect.  The right frontal lobe punctate lesion was suspicious for new metastasis.  The consensus was that she should be seen for radiosurgery to the new right frontal lobe lesion.  The patient denies any new neurologic symptoms.  Her main complaints are at least 2 weeks of upper respiratory symptoms including congestion and cough for which she has been receiving antibiotics from her PCP.  In regards to the patient's systemic therapy, the patient's chemotherapy consisting of alimta maintenance was discontinued due to renal insuffiencey for which she is followed by nephrology. She tolerated her last cycle of treatment with single agent immunotherapy with Keytruda well without any concerning or adverse  side effects.  She underwent restaging CT scans of the chest and abdomen and pelvis on 12/09/2020 which showed no evidence of new or progressive metastatic disease.  PREVIOUS RADIATION THERAPY:   Radiation Treatment Dates: 09/02/2020 through 09/02/2020 Site Technique Total Dose (Gy) Dose per Fx (Gy) Completed Fx Beam Energies  Brain: Brain_SRS 3D 20/20 20 1/1 6XFFF  Left temporal 4 mm metastasis was treated using stereotactic radiosurgery technique to a dose of 20 Gray in 1 fraction.    Radiation Treatment Dates: 09/22/2018 through 09/22/2018 Site Technique Total Dose Dose per Fx Completed Fx Beam Energies  Brain: Brain_SRS IMRT 20/20 20 1/1 6XFFF  The patient tolerated radiation therapy relatively well. [name] received stereotactic radiosurgery to the following targets: ExacTrac, 6 vmat beams max dose = 127.8% PTV1 Ant Rt Frontal 25mm PTV2 Lt Frontal 63mm PTV3 Rt Frontal 67mm PTV4 Rt Parietal 1mm PTV5 Lt Parietal 8mm PTV6 Lt Parietal 48mm PTV7 Lt Temporal 85mm PTV8 Post Rt Frontal 53mm PTV9 Lt Sup Temporal 64mm  PAST MEDICAL HISTORY:  has a past medical history of Anginal pain ([place]), Arthritis, Asthma, Brain metastases ([place]) (08/2018), Coronary artery disease, Diabetes mellitus without complication ([place]), GERD (gastroesophageal reflux disease), H/O hiatal hernia, Heart murmur, History of radiation therapy (09/22/2018), HPV in female, Hypertension, nscl ca (dx'd 08/07/18), Persistent headaches, Pneumonia, Shortness of breath, and Vaginal dryness.    PAST SURGICAL HISTORY: Past Surgical History:  Procedure Laterality Date   ABDOMINAL HYSTERECTOMY     bone spur     CARDIAC CATHETERIZATION  06/13/2012   carpel tunnel surgery Left    COLONOSCOPY  9 years ago   CORONARY ANGIOPLASTY  06/13/2012   EYE SURGERY     cyst left eye    IR IMAGING GUIDED PORT INSERTION  10/10/2018   LEFT HEART CATHETERIZATION WITH CORONARY ANGIOGRAM N/A 06/13/2012   Procedure: LEFT HEART CATHETERIZATION WITH  CORONARY ANGIOGRAM;  Surgeon: Laverda Page, MD;  Location: [facility] CATH LAB;  Service: Cardiovascular;  Laterality: N/A;   NECK SURGERY     rotator cuff surgery Right 2015   VIDEO BRONCHOSCOPY WITH ENDOBRONCHIAL NAVIGATION N/A 09/06/2018   Procedure: VIDEO BRONCHOSCOPY WITH ENDOBRONCHIAL NAVIGATION, left upper lung;  Surgeon: Lajuana Matte, MD;  Location: [facility];  Service: Thoracic;  Laterality: N/A;   VIDEO BRONCHOSCOPY WITH ENDOBRONCHIAL ULTRASOUND Left 09/06/2018   Procedure: VIDEO BRONCHOSCOPY WITH ENDOBRONCHIAL ULTRASOUND, left lung;  Surgeon: Lajuana Matte, MD;  Location: [facility];  Service: Thoracic;  Laterality: Left;   WISDOM TOOTH EXTRACTION      FAMILY HISTORY: family history includes Asthma in her father; Breast cancer in an other family member; Diabetes in her brother, mother, and sister; Glaucoma in her mother; Hypertension in her father, mother, and sister.  SOCIAL HISTORY:  reports that she quit smoking about 21 years ago. Her smoking use included cigarettes. She has a 15.00 pack-year smoking history. She has never used smokeless tobacco. She reports that she does not currently use alcohol. She reports that she does not currently use drugs.  ALLERGIES: Atorvastatin, Crestor [rosuvastatin], and Lisinopril  MEDICATIONS:  Current Outpatient Medications  Medication Sig Dispense Refill   acetaminophen (TYLENOL) 500 MG tablet Take 1,000 mg by mouth every 8 (eight) hours as needed for mild pain, fever or headache.      amLODipine (NORVASC) 10 MG tablet Take 1 tablet (10 mg total) by mouth daily. 90 tablet 3   amoxicillin-clavulanate (AUGMENTIN) 500-125 MG tablet Take 1 tablet by mouth 2 (two) times daily.     aspirin EC 81 MG tablet Take 81 mg by mouth daily.     carvedilol (COREG) 25 MG tablet Take 1/2 (one-half) tablet by mouth twice daily 180 tablet 0   Cholecalciferol (VITAMIN D-3) 125 MCG (5000 UT) TABS Take 5,000 Units by mouth daily.     Chromium Picolinate 1000 MCG  TABS Take 1,000 mg by mouth daily.     empagliflozin (JARDIANCE) 25 MG TABS tablet Take 1 tablet (25 mg total) by mouth daily before breakfast. 90 tablet 3   esomeprazole (NEXIUM) 20 MG capsule Take 1 capsule by mouth daily.     ezetimibe (ZETIA) 10 MG tablet TAKE 1 TABLET BY MOUTH ONCE DAILY AFTER SUPPER 90 tablet 3   fexofenadine (ALLEGRA) 180 MG tablet 1 tablet swallow whole with water; do not take with fruit juices.     Lancets (ONETOUCH DELICA PLUS [id number]) MISC SMARTSIG:1 Topical 3 Times Daily     lidocaine-prilocaine (EMLA) cream Apply to the Port-A-Cath site 30 to 60 minutes before chemotherapy. 30 g 2   metFORMIN (GLUCOPHAGE) 500 MG tablet Take 500 mg by mouth daily after breakfast.     methocarbamol (ROBAXIN) 500 MG tablet Take 1 tablet (500 mg total) by mouth every 6 (six) hours as needed for muscle spasms. 21 tablet 0   nitroGLYCERIN (NITROSTAT) 0.4 MG SL tablet Place 1 tablet (0.4 mg total) under the tongue every 5 (five) minutes as needed for chest pain. 25 tablet 2   ONETOUCH ULTRA test strip USE 1 STRIP TO CHECK GLUCOSE THREE TIMES DAILY     Pitavastatin Calcium (LIVALO) 4  MG TABS Take 4 mg by mouth at bedtime.      telmisartan (MICARDIS) 40 MG tablet Take 1 tablet by mouth daily.     No current facility-administered medications for this encounter.   Facility-Administered Medications Ordered in Other Encounters  Medication Dose Route Frequency Provider Last Rate Last Admin   lidocaine-prilocaine (EMLA) 2.5-2.5 % cream             REVIEW OF SYSTEMS:  As above.   PHYSICAL EXAM:  weight is 175 lb 6.4 oz (79.6 kg). Her temperature is 97.3 F (36.3 C) (abnormal). Her blood pressure is 147/67 (abnormal) and her pulse is 71. Her respiration is 18 and oxygen saturation is 98%.   General: Alert and oriented, in no acute distress   HEENT: Head is normocephalic. Extraocular movements are intact.  Musculoskeletal: Ambulatory Neurologic:   No obvious focalities. Speech is fluent.  Coordination is intact. Psychiatric: Judgment and insight are intact. Affect is appropriate.  KPS = 90  100 - Normal; no complaints; no evidence of disease. 90   - Able to carry on normal activity; minor signs or symptoms of disease. 80   - Normal activity with effort; some signs or symptoms of disease. 36   - Cares for self; unable to carry on normal activity or to do active work. 60   - Requires occasional assistance, but is able to care for most of his personal needs. 50   - Requires considerable assistance and frequent medical care. 65   - Disabled; requires special care and assistance. 8   - Severely disabled; hospital admission is indicated although death not imminent. 49   - Very sick; hospital admission necessary; active supportive treatment necessary. 10   - Moribund; fatal processes progressing rapidly. 0     - Dead  Karnofsky DA, [place], [place] LS and [place] [phone]) The use of the nitrogen mustards in the palliative treatment of carcinoma: with particular reference to bronchogenic carcinoma Cancer 1 634-56    LABORATORY DATA:  Lab Results  Component Value Date   WBC 6.4 11/20/2020   HGB 11.2 (L) 11/20/2020   HCT 36.7 11/20/2020   MCV 78.6 (L) 11/20/2020   PLT 293 11/20/2020   CMP     Component Value Date/Time   NA 138 11/20/2020 0910   NA 139 08/18/2020 0801   K 4.1 11/20/2020 0910   CL 105 11/20/2020 0910   CO2 26 11/20/2020 0910   GLUCOSE 118 (H) 11/20/2020 0910   BUN 17 11/20/2020 0910   BUN 16 08/18/2020 0801   CREATININE 1.53 (H) 11/20/2020 0910   CALCIUM 9.0 11/20/2020 0910   PROT 7.3 11/20/2020 0910   ALBUMIN 3.6 11/20/2020 0910   AST 23 11/20/2020 0910   ALT 14 11/20/2020 0910   ALKPHOS 81 11/20/2020 0910   BILITOT 0.6 11/20/2020 0910   GFRNONAA 37 (L) 11/20/2020 0910   GFRAA 37 (L) 11/12/2019 0917   GFRAA 37 (L) 11/08/2019 0900         RADIOGRAPHY: CT Abdomen Pelvis Wo Contrast  Result Date: 12/09/2020 CLINICAL DATA:  Non-small  cell lung cancer, metastatic, assess treatment response; Metastatic disease evaluation. Brain metastases status post radiation therapy. Chemotherapy completed January 2021. Patient reports dyspnea and cough. EXAM: CT CHEST, ABDOMEN AND PELVIS WITHOUT CONTRAST TECHNIQUE: Multidetector CT imaging of the chest, abdomen and pelvis was performed following the standard protocol without IV contrast. COMPARISON:  10/07/2020 CT chest, abdomen and pelvis. FINDINGS: CT CHEST FINDINGS Cardiovascular: Normal heart size.  Small chronic anterior pericardial effusion/thickening, stable. Three-vessel coronary atherosclerosis. Right internal jugular Port-A-Cath terminates at the cavoatrial junction. Atherosclerotic nonaneurysmal thoracic aorta. Normal caliber pulmonary arteries. Mediastinum/Nodes: No discrete thyroid nodules. Unremarkable esophagus. No pathologically enlarged axillary, mediastinal or hilar lymph nodes, noting limited sensitivity for the detection of hilar adenopathy on this noncontrast study. Lungs/Pleura: No pneumothorax. No pleural effusion. Mild centrilobular and paraseptal emphysema. Irregular 0.9 cm left upper lobe (series 7/image 45) and 0.8 cm peripheral right upper lobe (series 7/image 39) nodular opacities, both stable using similar measurement technique. No acute consolidative airspace disease, lung masses or new significant pulmonary nodules. Musculoskeletal: No aggressive appearing focal osseous lesions. Mild thoracic spondylosis. Partially visualized surgical hardware from ACDF. CT ABDOMEN PELVIS FINDINGS Hepatobiliary: Normal liver with no liver mass. Normal gallbladder with no radiopaque cholelithiasis. No biliary ductal dilatation. Pancreas: Normal, with no mass or duct dilation. Spleen: Normal size. No mass. Adrenals/Urinary Tract: Normal adrenals. No contour deforming renal masses. No hydronephrosis. No renal stones. Normal bladder. Stomach/Bowel: Normal non-distended stomach. Normal caliber small  bowel with no small bowel wall thickening. Normal appendix. Normal large bowel with no diverticulosis, large bowel wall thickening or pericolonic fat stranding. Vascular/Lymphatic: Atherosclerotic nonaneurysmal abdominal aorta. No pathologically enlarged lymph nodes in the abdomen or pelvis. Reproductive: Status post hysterectomy, with no abnormal findings at the vaginal cuff. No adnexal mass. Other: No pneumoperitoneum, ascites or focal fluid collection. Musculoskeletal: No aggressive appearing focal osseous lesions. Moderate lumbar spondylosis. Chronic 7 mm anterolisthesis at L4-5. IMPRESSION: 1. Stable exam. No new or progressive metastatic disease in the chest, abdomen or pelvis. Stable irregular bilateral upper lobe nodular opacities, compatible with post treatment change. 2. Aortic Atherosclerosis (ICD10-I70.0) and Emphysema (ICD10-J43.9). Electronically Signed   By: Ilona Sorrel M.D.   On: 12/09/2020 09:35   DG Chest 2 View  Result Date: 11/10/2020 CLINICAL DATA:  Chest and shoulder pain in the left posterior region near the scapula. Pain for 2 days. History of therapy including chemotherapy for left upper lung carcinoma. EXAM: CHEST - 2 VIEW COMPARISON:  11/10/2020 at 11:56 a.m. FINDINGS: Cardiac silhouette is normal in size. Normal mediastinal and hilar contours. Right anterior chest wall Port-A-Cath is stable, tip in the lower superior vena cava. Reticular opacities noted in the left upper lobe faint likely scarring from treatment of previous lung carcinoma. There is blunting of the left lateral costophrenic sulcus consistent with pleural thickening. Remainder of the lungs is clear. No convincing pleural effusion and no pneumothorax. Skeletal structures are intact. IMPRESSION: No acute cardiopulmonary disease. Electronically Signed   By: Lajean Manes M.D.   On: 11/10/2020 15:05   DG Ribs Unilateral W/Chest Left  Result Date: 11/10/2020 CLINICAL DATA:  Left shoulder blade pain EXAM: LEFT RIBS AND  CHEST - 3 VIEW COMPARISON:  11/12/2019 FINDINGS: Right chest port with catheter tip in the SVC. No fracture or other bone lesions are seen involving the ribs or left scapula. There is no evidence of pneumothorax or pleural effusion. Both lungs are clear. Heart size and mediastinal contours are within normal limits. Aortic atherosclerotic calcifications. IMPRESSION: Negative. Electronically Signed   By: Merilyn Baba M.D.   On: 11/10/2020 12:37   CT Chest Wo Contrast  Result Date: 12/09/2020 CLINICAL DATA:  Non-small cell lung cancer, metastatic, assess treatment response; Metastatic disease evaluation. Brain metastases status post radiation therapy. Chemotherapy completed January 2021. Patient reports dyspnea and cough. EXAM: CT CHEST, ABDOMEN AND PELVIS WITHOUT CONTRAST TECHNIQUE: Multidetector CT imaging of the chest, abdomen and pelvis was  performed following the standard protocol without IV contrast. COMPARISON:  10/07/2020 CT chest, abdomen and pelvis. FINDINGS: CT CHEST FINDINGS Cardiovascular: Normal heart size. Small chronic anterior pericardial effusion/thickening, stable. Three-vessel coronary atherosclerosis. Right internal jugular Port-A-Cath terminates at the cavoatrial junction. Atherosclerotic nonaneurysmal thoracic aorta. Normal caliber pulmonary arteries. Mediastinum/Nodes: No discrete thyroid nodules. Unremarkable esophagus. No pathologically enlarged axillary, mediastinal or hilar lymph nodes, noting limited sensitivity for the detection of hilar adenopathy on this noncontrast study. Lungs/Pleura: No pneumothorax. No pleural effusion. Mild centrilobular and paraseptal emphysema. Irregular 0.9 cm left upper lobe (series 7/image 45) and 0.8 cm peripheral right upper lobe (series 7/image 39) nodular opacities, both stable using similar measurement technique. No acute consolidative airspace disease, lung masses or new significant pulmonary nodules. Musculoskeletal: No aggressive appearing focal  osseous lesions. Mild thoracic spondylosis. Partially visualized surgical hardware from ACDF. CT ABDOMEN PELVIS FINDINGS Hepatobiliary: Normal liver with no liver mass. Normal gallbladder with no radiopaque cholelithiasis. No biliary ductal dilatation. Pancreas: Normal, with no mass or duct dilation. Spleen: Normal size. No mass. Adrenals/Urinary Tract: Normal adrenals. No contour deforming renal masses. No hydronephrosis. No renal stones. Normal bladder. Stomach/Bowel: Normal non-distended stomach. Normal caliber small bowel with no small bowel wall thickening. Normal appendix. Normal large bowel with no diverticulosis, large bowel wall thickening or pericolonic fat stranding. Vascular/Lymphatic: Atherosclerotic nonaneurysmal abdominal aorta. No pathologically enlarged lymph nodes in the abdomen or pelvis. Reproductive: Status post hysterectomy, with no abnormal findings at the vaginal cuff. No adnexal mass. Other: No pneumoperitoneum, ascites or focal fluid collection. Musculoskeletal: No aggressive appearing focal osseous lesions. Moderate lumbar spondylosis. Chronic 7 mm anterolisthesis at L4-5. IMPRESSION: 1. Stable exam. No new or progressive metastatic disease in the chest, abdomen or pelvis. Stable irregular bilateral upper lobe nodular opacities, compatible with post treatment change. 2. Aortic Atherosclerosis (ICD10-I70.0) and Emphysema (ICD10-J43.9). Electronically Signed   By: Ilona Sorrel M.D.   On: 12/09/2020 09:35   MR BRAIN W WO CONTRAST  Result Date: 12/05/2020 CLINICAL DATA:  Brain/CNS neoplasm, assess treatment response; lung cancer EXAM: MRI HEAD WITHOUT AND WITH CONTRAST TECHNIQUE: Multiplanar, multiecho pulse sequences of the brain and surrounding structures were obtained without and with intravenous contrast. CONTRAST:  74mL MULTIHANCE COMPARISON:  08/15/2020 FINDINGS: Brain: New punctate focus of enhancement in the right superior frontal gyrus (series 12, image 128). Increase in size of  left temporal lesion measuring 7 mm (previously 3 mm). Stable previously described punctate focus of enhancement in the inferior left cerebellum, which does not represent an active metastasis. There is increased prominence of enhancement at the genu of the left facial nerve also present on more recent prior studies (series 12, image 41). No acute infarction. No hydrocephalus. Patchy foci of T2 hyperintensity in the supratentorial white matter may reflect minor chronic microvascular ischemic changes. Unchanged bilateral inferior cerebellar encephalomalacia. Vascular: Major vessel flow voids at the skull base are preserved. Skull and upper cervical spine: Normal marrow signal is preserved. Postsurgical changes of anterior fusion extending inferiorly from C3. Sinuses/Orbits: Paranasal sinus mucosal thickening. Orbits are unremarkable. Other: Sella is unremarkable.  Mastoid air cells are clear. IMPRESSION: Increase in size of left temporal metastasis. New punctate enhancement right frontal lobe could reflect a new metastasis. Increased prominence of enhancement at the genu of the left facial nerve also present on recent prior studies. Likely unrelated and may reflect a schwannoma. Electronically Signed   By: Macy Mis M.D.   On: 12/05/2020 15:17      IMPRESSION/PLAN: This is a very  pleasant 65year old woman with metastatic disease to the brain.  I had a lengthy discussion with the patient after reviewing their MRI results with them.  There is a single lesion that appears to demonstrate new disease in the right frontal lobe.  I recommend radiosurgery to this lesion.  She understands that the risks of radiosurgery may include but not necessarily be limited to headache, dizziness, edema in the brain, radionecrosis, possible necessity for medication or surgical treatment to address refractory disease and or inflammatory response to treatment.  Brain injury is a possibility but the risks of treatment far decreased  when we can treat the lesion although it is very small.  After lengthy discussion, the patient would like to proceed with stereotactic brain radiosurgery to their metastatic disease.  We will proceed with treatment planning and deliver her treatment next week   Anticipate 20 Gray in 1 fraction to the single new lesion in the right frontal lobe  On date of service, in total, I spent 30 minutes on this encounter. Patient was seen in person.  __________________________________________   Eppie Gibson, MD  This document serves as a record of services personally performed by Eppie Gibson, MD. It was created on her behalf by Roney Mans, a trained medical scribe. The creation of this record is based on the scribe's personal observations and the provider's statements to them. This document has been checked and approved by the attending provider.

## 2020-12-09 ENCOUNTER — Encounter (HOSPITAL_COMMUNITY): Payer: Self-pay

## 2020-12-09 ENCOUNTER — Ambulatory Visit: Payer: Medicare Other | Admitting: Radiation Oncology

## 2020-12-09 ENCOUNTER — Ambulatory Visit: Payer: Medicare Other

## 2020-12-09 ENCOUNTER — Ambulatory Visit
Admission: RE | Admit: 2020-12-09 | Discharge: 2020-12-09 | Disposition: A | Discharge status: Home / Self Care | Payer: Medicare Other | Source: Ambulatory Visit | Attending: Radiation Oncology | Admitting: Radiation Oncology

## 2020-12-09 ENCOUNTER — Ambulatory Visit (HOSPITAL_COMMUNITY)
Admission: RE | Admit: 2020-12-09 | Discharge: 2020-12-09 | Disposition: A | Discharge status: Home / Self Care | Payer: Medicare Other | Source: Ambulatory Visit | Attending: Physician Assistant | Admitting: Physician Assistant

## 2020-12-09 ENCOUNTER — Encounter: Payer: Self-pay | Admitting: Radiation Oncology

## 2020-12-09 ENCOUNTER — Other Ambulatory Visit: Payer: Self-pay

## 2020-12-09 VITALS — BP 147/67 | HR 71 | Temp 97.3°F | Resp 18 | Wt 175.4 lb

## 2020-12-09 DIAGNOSIS — J432 Centrilobular emphysema: Secondary | ICD-10-CM | POA: Insufficient documentation

## 2020-12-09 DIAGNOSIS — I2510 Atherosclerotic heart disease of native coronary artery without angina pectoris: Secondary | ICD-10-CM | POA: Insufficient documentation

## 2020-12-09 DIAGNOSIS — C3491 Malignant neoplasm of unspecified part of right bronchus or lung: Secondary | ICD-10-CM | POA: Insufficient documentation

## 2020-12-09 DIAGNOSIS — M47814 Spondylosis without myelopathy or radiculopathy, thoracic region: Secondary | ICD-10-CM | POA: Insufficient documentation

## 2020-12-09 DIAGNOSIS — I3139 Other pericardial effusion (noninflammatory): Secondary | ICD-10-CM | POA: Insufficient documentation

## 2020-12-09 DIAGNOSIS — C3412 Malignant neoplasm of upper lobe, left bronchus or lung: Secondary | ICD-10-CM | POA: Insufficient documentation

## 2020-12-09 DIAGNOSIS — Z7984 Long term (current) use of oral hypoglycemic drugs: Secondary | ICD-10-CM | POA: Insufficient documentation

## 2020-12-09 DIAGNOSIS — C7931 Secondary malignant neoplasm of brain: Secondary | ICD-10-CM | POA: Insufficient documentation

## 2020-12-09 DIAGNOSIS — Z87891 Personal history of nicotine dependence: Secondary | ICD-10-CM | POA: Insufficient documentation

## 2020-12-09 DIAGNOSIS — I129 Hypertensive chronic kidney disease with stage 1 through stage 4 chronic kidney disease, or unspecified chronic kidney disease: Secondary | ICD-10-CM | POA: Insufficient documentation

## 2020-12-09 DIAGNOSIS — E119 Type 2 diabetes mellitus without complications: Secondary | ICD-10-CM | POA: Insufficient documentation

## 2020-12-09 DIAGNOSIS — Z510 Encounter for antineoplastic radiation therapy: Secondary | ICD-10-CM | POA: Diagnosis not present

## 2020-12-09 DIAGNOSIS — Z7982 Long term (current) use of aspirin: Secondary | ICD-10-CM | POA: Insufficient documentation

## 2020-12-09 DIAGNOSIS — I700 Atherosclerosis of aorta: Secondary | ICD-10-CM | POA: Diagnosis not present

## 2020-12-09 DIAGNOSIS — C3490 Malignant neoplasm of unspecified part of unspecified bronchus or lung: Secondary | ICD-10-CM | POA: Diagnosis not present

## 2020-12-09 DIAGNOSIS — J439 Emphysema, unspecified: Secondary | ICD-10-CM | POA: Diagnosis not present

## 2020-12-09 DIAGNOSIS — R0600 Dyspnea, unspecified: Secondary | ICD-10-CM | POA: Diagnosis not present

## 2020-12-09 MED ORDER — LIDOCAINE-PRILOCAINE 2.5-2.5 % EX CREA
TOPICAL_CREAM | CUTANEOUS | Status: AC
  Filled 2020-12-09: qty 5

## 2020-12-10 ENCOUNTER — Inpatient Hospital Stay: Payer: Medicare Other

## 2020-12-10 ENCOUNTER — Encounter: Payer: Self-pay | Admitting: Internal Medicine

## 2020-12-10 ENCOUNTER — Inpatient Hospital Stay (HOSPITAL_BASED_OUTPATIENT_CLINIC_OR_DEPARTMENT_OTHER): Payer: Medicare Other | Admitting: Internal Medicine

## 2020-12-10 ENCOUNTER — Inpatient Hospital Stay: Payer: Medicare Other | Attending: Physician Assistant

## 2020-12-10 VITALS — BP 146/66 | HR 73 | Temp 97.3°F | Resp 19 | Ht 60.5 in | Wt 174.6 lb

## 2020-12-10 DIAGNOSIS — Z79899 Other long term (current) drug therapy: Secondary | ICD-10-CM | POA: Diagnosis not present

## 2020-12-10 DIAGNOSIS — Z510 Encounter for antineoplastic radiation therapy: Secondary | ICD-10-CM | POA: Diagnosis not present

## 2020-12-10 DIAGNOSIS — Z5112 Encounter for antineoplastic immunotherapy: Secondary | ICD-10-CM

## 2020-12-10 DIAGNOSIS — Z95828 Presence of other vascular implants and grafts: Secondary | ICD-10-CM

## 2020-12-10 DIAGNOSIS — R5383 Other fatigue: Secondary | ICD-10-CM

## 2020-12-10 DIAGNOSIS — C7931 Secondary malignant neoplasm of brain: Secondary | ICD-10-CM

## 2020-12-10 DIAGNOSIS — C3412 Malignant neoplasm of upper lobe, left bronchus or lung: Secondary | ICD-10-CM | POA: Insufficient documentation

## 2020-12-10 DIAGNOSIS — C3492 Malignant neoplasm of unspecified part of left bronchus or lung: Secondary | ICD-10-CM

## 2020-12-10 DIAGNOSIS — C3491 Malignant neoplasm of unspecified part of right bronchus or lung: Secondary | ICD-10-CM

## 2020-12-10 DIAGNOSIS — C3490 Malignant neoplasm of unspecified part of unspecified bronchus or lung: Secondary | ICD-10-CM | POA: Diagnosis not present

## 2020-12-10 DIAGNOSIS — L821 Other seborrheic keratosis: Secondary | ICD-10-CM | POA: Diagnosis not present

## 2020-12-10 LAB — CBC WITH DIFFERENTIAL (CANCER CENTER ONLY)
Abs Immature Granulocytes: 0.02 10*3/uL (ref 0.00–0.07)
Basophils Absolute: 0.1 10*3/uL (ref 0.0–0.1)
Basophils Relative: 1 %
Eosinophils Absolute: 0.5 10*3/uL (ref 0.0–0.5)
Eosinophils Relative: 6 %
HCT: 38.2 % (ref 36.0–46.0)
Hemoglobin: 11.7 g/dL — ABNORMAL LOW (ref 12.0–15.0)
Immature Granulocytes: 0 %
Lymphocytes Relative: 24 %
Lymphs Abs: 1.8 10*3/uL (ref 0.7–4.0)
MCH: 23.6 pg — ABNORMAL LOW (ref 26.0–34.0)
MCHC: 30.6 g/dL (ref 30.0–36.0)
MCV: 77 fL — ABNORMAL LOW (ref 80.0–100.0)
Monocytes Absolute: 0.6 10*3/uL (ref 0.1–1.0)
Monocytes Relative: 8 %
Neutro Abs: 4.5 10*3/uL (ref 1.7–7.7)
Neutrophils Relative %: 61 %
Platelet Count: 281 10*3/uL (ref 150–400)
RBC: 4.96 MIL/uL (ref 3.87–5.11)
RDW: 16.5 % — ABNORMAL HIGH (ref 11.5–15.5)
WBC Count: 7.4 10*3/uL (ref 4.0–10.5)
nRBC: 0 % (ref 0.0–0.2)

## 2020-12-10 LAB — CMP (CANCER CENTER ONLY)
ALT: 14 U/L (ref 0–44)
AST: 22 U/L (ref 15–41)
Albumin: 3.4 g/dL — ABNORMAL LOW (ref 3.5–5.0)
Alkaline Phosphatase: 86 U/L (ref 38–126)
Anion gap: 13 (ref 5–15)
BUN: 16 mg/dL (ref 8–23)
CO2: 22 mmol/L (ref 22–32)
Calcium: 9 mg/dL (ref 8.9–10.3)
Chloride: 108 mmol/L (ref 98–111)
Creatinine: 1.41 mg/dL — ABNORMAL HIGH (ref 0.44–1.00)
GFR, Estimated: 41 mL/min — ABNORMAL LOW (ref >60)
Glucose, Bld: 91 mg/dL (ref 70–99)
Potassium: 3.9 mmol/L (ref 3.5–5.1)
Sodium: 143 mmol/L (ref 135–145)
Total Bilirubin: 0.3 mg/dL (ref 0.3–1.2)
Total Protein: 7.3 g/dL (ref 6.5–8.1)

## 2020-12-10 LAB — TSH: TSH: 1.753 u[IU]/mL (ref 0.308–3.960)

## 2020-12-10 MED ORDER — SODIUM CHLORIDE 0.9% FLUSH
10.0000 mL | INTRAVENOUS | Status: DC | PRN
Start: 2020-12-10 — End: 2020-12-10
  Administered 2020-12-10: 10 mL

## 2020-12-10 MED ORDER — HEPARIN SOD (PORK) LOCK FLUSH 100 UNIT/ML IV SOLN
500.0000 [IU] | Freq: Once | INTRAVENOUS | Status: AC | PRN
  Administered 2020-12-10: 500 [IU]

## 2020-12-10 MED ORDER — SODIUM CHLORIDE 0.9 % IV SOLN
200.0000 mg | Freq: Once | INTRAVENOUS | Status: AC
  Administered 2020-12-10: 200 mg via INTRAVENOUS
  Filled 2020-12-10: qty 8

## 2020-12-10 MED ORDER — SODIUM CHLORIDE 0.9 % IV SOLN: Freq: Once | INTRAVENOUS | Status: AC

## 2020-12-10 NOTE — Patient Instructions (Signed)
[facility]  Discharge Instructions: Thank you for choosing [facility] to provide your oncology and hematology care.   If you have a lab appointment with the [facility], please go directly to the [facility] and check in at the registration area.   Wear comfortable clothing and clothing appropriate for easy access to any Portacath or PICC line.   We strive to give you quality time with your provider. You may need to reschedule your appointment if you arrive late (15 or more minutes).  Arriving late affects you and other patients whose appointments are after yours.  Also, if you miss three or more appointments without notifying the office, you may be dismissed from the clinic at the provider's discretion.      For prescription refill requests, have your pharmacy contact our office and allow 72 hours for refills to be completed.    Today you received the following chemotherapy and/or immunotherapy agents Carolyn [name]      To help prevent nausea and vomiting after your treatment, we encourage you to take your nausea medication as directed.  BELOW ARE SYMPTOMS THAT SHOULD BE REPORTED IMMEDIATELY: *FEVER GREATER THAN 100.4 F (38 C) OR HIGHER *CHILLS OR SWEATING *NAUSEA AND VOMITING THAT IS NOT CONTROLLED WITH YOUR NAUSEA MEDICATION *UNUSUAL SHORTNESS OF BREATH *UNUSUAL BRUISING OR BLEEDING *URINARY PROBLEMS (pain or burning when urinating, or frequent urination) *BOWEL PROBLEMS (unusual diarrhea, constipation, pain near the anus) TENDERNESS IN MOUTH AND THROAT WITH OR WITHOUT PRESENCE OF ULCERS (sore throat, sores in mouth, or a toothache) UNUSUAL RASH, SWELLING OR PAIN  UNUSUAL VAGINAL DISCHARGE OR ITCHING   Items with * indicate a potential emergency and should be followed up as soon as possible or go to the Emergency Department if any problems should occur.  Please show the CHEMOTHERAPY ALERT CARD or IMMUNOTHERAPY ALERT CARD at check-in to  the Emergency Department and triage nurse.  Should you have questions after your visit or need to cancel or reschedule your appointment, please contact [facility]  Dept: [phone]  and follow the prompts.  Office hours are 8:00 a.m. to 4:30 p.m. Monday - Friday. Please note that voicemails left after 4:00 p.m. may not be returned until the following business day.  We are closed weekends and major holidays. You have access to a nurse at all times for urgent questions. Please call the main number to the clinic Dept: [phone] and follow the prompts.   For any non-urgent questions, you may also contact your provider using MyChart. We now offer e-Visits for anyone 35 and older to request care online for non-urgent symptoms. For details visit [email or web address].   Also download the MyChart app! Go to the app store, search "MyChart", open the app, select [facility], and log in with your MyChart username and password.  Due to Covid, a mask is required upon entering the hospital/clinic. If you do not have a mask, one will be given to you upon arrival. For doctor visits, patients may have 1 support person aged 1 or older with them. For treatment visits, patients cannot have anyone with them due to current Covid guidelines and our immunocompromised population.

## 2020-12-10 NOTE — Progress Notes (Signed)
[place] [phone]   [phone]  OFFICE PROGRESS NOTE  [name], [place] [address]  DIAGNOSIS: Stage IV (T1c, N1, M1c ) non-small cell lung cancer, adenocarcinoma diagnosed in July 2020 and presented with left upper lobe lung nodule in addition to right upper lobe lung nodule with left hilar lymphadenopathy as well as multiple brain metastasis.  Biomarker Findings Microsatellite status - MS-Stable Tumor Mutational Burden - 4 Muts/Mb Genomic Findings For a complete list of the genes assayed, please refer to the Appendix. ATM G204* KRAS G12C PDGFRA P122T SMO R199W 7 Disease relevant genes with no reportable alterations: ALK, BRAF, EGFR, ERBB2, MET, RET, ROS1  PDL 1 expression was 1%  PRIOR THERAPY:  1) SRS to multiple brain metastasis under the care of Dr. Isidore Moos. 2) SRS to a 4 mm new brain metastasis under the care of Dr. Isidore Moos and Dr. Saintclair Halsted on 09/02/2020  CURRENT THERAPY: Systemic chemotherapy with carboplatin for AUC of 5, Alimta 500 mg/M2 and Keytruda 200 mg IV every 3 weeks.  First dose October 05, 2018.  Status post 38 cycles.  Starting from cycle #5 the patient will be on treatment with maintenance Alimta 500 mg/M2 and Keytruda 200 mg IV every 3 weeks.  She has been on treatment with single agent Keytruda recently secondary to renal insufficiency.  INTERVAL HISTORY: Carolyn [name] 65 y.o. female returns to the clinic today for follow-up visit.  The patient is feeling fine today with no concerning complaints.  She was recently diagnosed with upper respiratory infection and treated with her primary care physician with a course of amoxicillin after getting the infection from her grandson.  The patient denied having any current chest pain, shortness of breath, cough or hemoptysis.  She denied having any fever or chills.  She has no nausea, vomiting, diarrhea or constipation.  She has no  headache or visual changes.  She had repeat CT scan of the chest performed recently and she is here for evaluation and discussion of her scan results.   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  MEDICATIONS:  Current Outpatient Medications  Medication Sig Dispense Refill   acetaminophen (TYLENOL) 500 MG tablet Take 1,000 mg by mouth every 8 (eight) hours as needed for mild pain, fever or headache.      amLODipine (NORVASC) 10 MG tablet Take 1 tablet (10 mg total) by mouth daily. 90 tablet 3   amoxicillin-clavulanate (AUGMENTIN) 500-125 MG tablet Take 1 tablet by mouth 2 (two) times daily.     aspirin EC 81 MG tablet Take 81  mg by mouth daily.     carvedilol (COREG) 25 MG tablet Take 1/2 (one-half) tablet by mouth twice daily 180 tablet 0   Cholecalciferol (VITAMIN D-3) 125 MCG (5000 UT) TABS Take 5,000 Units by mouth daily.     Chromium Picolinate 1000 MCG TABS Take 1,000 mg by mouth daily.     empagliflozin (JARDIANCE) 25 MG TABS tablet Take 1 tablet (25 mg total) by mouth daily before breakfast. 90 tablet 3   esomeprazole (NEXIUM) 20 MG capsule Take 1 capsule by mouth daily.     ezetimibe (ZETIA) 10 MG tablet TAKE 1 TABLET BY MOUTH ONCE DAILY AFTER SUPPER 90 tablet 3   fexofenadine (ALLEGRA) 180 MG tablet 1 tablet swallow whole with water; do not take with  fruit juices.     Lancets (ONETOUCH DELICA PLUS [id number]) MISC SMARTSIG:1 Topical 3 Times Daily     lidocaine-prilocaine (EMLA) cream Apply to the Port-A-Cath site 30 to 60 minutes before chemotherapy. 30 g 2   metFORMIN (GLUCOPHAGE) 500 MG tablet Take 500 mg by mouth daily after breakfast.     methocarbamol (ROBAXIN) 500 MG tablet Take 1 tablet (500 mg total) by mouth every 6 (six) hours as needed for muscle spasms. 21 tablet 0   nitroGLYCERIN (NITROSTAT) 0.4 MG SL tablet Place 1 tablet (0.4 mg total) under the tongue every 5 (five) minutes as needed for chest pain. 25 tablet 2   ONETOUCH ULTRA test strip USE 1 STRIP TO CHECK GLUCOSE THREE TIMES DAILY     Pitavastatin Calcium (LIVALO) 4 MG TABS Take 4 mg by mouth at bedtime.      telmisartan (MICARDIS) 40 MG tablet Take 1 tablet by mouth daily.     No current facility-administered medications for this visit.    SURGICAL HISTORY:  Past Surgical History:  Procedure Laterality Date   ABDOMINAL HYSTERECTOMY     bone spur     CARDIAC CATHETERIZATION  06/13/2012   carpel tunnel surgery Left    COLONOSCOPY     9 years ago   CORONARY ANGIOPLASTY  06/13/2012   EYE SURGERY     cyst left eye    IR IMAGING GUIDED PORT INSERTION  10/10/2018   LEFT HEART CATHETERIZATION WITH CORONARY ANGIOGRAM N/A 06/13/2012   Procedure: LEFT HEART CATHETERIZATION WITH CORONARY ANGIOGRAM;  Surgeon: Laverda Page, MD;  Location: [facility] CATH LAB;  Service: Cardiovascular;  Laterality: N/A;   NECK SURGERY     rotator cuff surgery Right 2015   VIDEO BRONCHOSCOPY WITH ENDOBRONCHIAL NAVIGATION N/A 09/06/2018   Procedure: VIDEO BRONCHOSCOPY WITH ENDOBRONCHIAL NAVIGATION, left upper lung;  Surgeon: Lajuana Matte, MD;  Location: [facility];  Service: Thoracic;  Laterality: N/A;   VIDEO BRONCHOSCOPY WITH ENDOBRONCHIAL ULTRASOUND Left 09/06/2018   Procedure: VIDEO BRONCHOSCOPY WITH ENDOBRONCHIAL ULTRASOUND, left lung;  Surgeon: Lajuana Matte, MD;  Location: [facility];   Service: Thoracic;  Laterality: Left;   WISDOM TOOTH EXTRACTION      REVIEW OF SYSTEMS:  Constitutional: negative Eyes: negative Ears, nose, mouth, throat, and face: negative Respiratory: positive for cough Cardiovascular: negative Gastrointestinal: negative Genitourinary:negative Integument/breast: negative Hematologic/lymphatic: negative Musculoskeletal:negative Neurological: negative Behavioral/Psych: negative Endocrine: negative Allergic/Immunologic: negative   PHYSICAL EXAMINATION: General appearance: alert, cooperative, fatigued, and no distress Head: Normocephalic, without obvious abnormality, atraumatic Neck: no adenopathy, no JVD, supple, symmetrical, trachea midline, and thyroid not enlarged, symmetric, no tenderness/mass/nodules Lymph nodes: Cervical, supraclavicular, and axillary nodes normal. Resp: clear to auscultation bilaterally Back: symmetric, no curvature. ROM normal. No CVA tenderness.  Cardio: regular rate and rhythm, S1, S2 normal, no murmur, click, rub or gallop GI: soft, non-tender; bowel sounds normal; no masses,  no organomegaly Extremities: extremities normal, atraumatic, no cyanosis or edema Neurologic: Alert and oriented X 3, normal strength and tone. Normal symmetric reflexes. Normal coordination and gait  ECOG PERFORMANCE STATUS: 1 - Symptomatic but completely ambulatory  Blood pressure (!) 146/66, pulse 73, temperature (!) 97.3 F (36.3 C), temperature source Tympanic, resp. rate 19, height 5' 0.5" (1.537 m), weight 174 lb 9.6 oz (79.2 kg), SpO2 98 %.  LABORATORY DATA: Lab Results  Component Value Date   WBC 7.4 12/10/2020   HGB 11.7 (L) 12/10/2020   HCT 38.2 12/10/2020   MCV 77.0 (L) 12/10/2020   PLT 281 12/10/2020      Chemistry      Component Value Date/Time   NA 138 11/20/2020 0910   NA 139 08/18/2020 0801   K 4.1 11/20/2020 0910   CL 105 11/20/2020 0910   CO2 26 11/20/2020 0910   BUN 17 11/20/2020 0910   BUN 16 08/18/2020 0801    CREATININE 1.53 (H) 11/20/2020 0910      Component Value Date/Time   CALCIUM 9.0 11/20/2020 0910   ALKPHOS 81 11/20/2020 0910   AST 23 11/20/2020 0910   ALT 14 11/20/2020 0910   BILITOT 0.6 11/20/2020 0910       RADIOGRAPHIC STUDIES: CT Abdomen Pelvis Wo Contrast  Result Date: 12/09/2020 CLINICAL DATA:  Non-small cell lung cancer, metastatic, assess treatment response; Metastatic disease evaluation. Brain metastases status post radiation therapy. Chemotherapy completed January 2021. Patient reports dyspnea and cough. EXAM: CT CHEST, ABDOMEN AND PELVIS WITHOUT CONTRAST TECHNIQUE: Multidetector CT imaging of the chest, abdomen and pelvis was performed following the standard protocol without IV contrast. COMPARISON:  10/07/2020 CT chest, abdomen and pelvis. FINDINGS: CT CHEST FINDINGS Cardiovascular: Normal heart size. Small chronic anterior pericardial effusion/thickening, stable. Three-vessel coronary atherosclerosis. Right internal jugular Port-A-Cath terminates at the cavoatrial junction. Atherosclerotic nonaneurysmal thoracic aorta. Normal caliber pulmonary arteries. Mediastinum/Nodes: No discrete thyroid nodules. Unremarkable esophagus. No pathologically enlarged axillary, mediastinal or hilar lymph nodes, noting limited sensitivity for the detection of hilar adenopathy on this noncontrast study. Lungs/Pleura: No pneumothorax. No pleural effusion. Mild centrilobular and paraseptal emphysema. Irregular 0.9 cm left upper lobe (series 7/image 45) and 0.8 cm peripheral right upper lobe (series 7/image 39) nodular opacities, both stable using similar measurement technique. No acute consolidative airspace disease, lung masses or new significant pulmonary nodules. Musculoskeletal: No aggressive appearing focal osseous lesions. Mild thoracic spondylosis. Partially visualized surgical hardware from ACDF. CT ABDOMEN PELVIS FINDINGS Hepatobiliary: Normal liver with no liver mass. Normal gallbladder with no  radiopaque cholelithiasis. No biliary ductal dilatation. Pancreas: Normal, with no mass or duct dilation. Spleen: Normal size. No mass. Adrenals/Urinary Tract: Normal adrenals. No contour deforming renal masses. No hydronephrosis. No renal stones. Normal bladder. Stomach/Bowel: Normal non-distended stomach. Normal caliber small bowel with no small bowel wall thickening. Normal appendix. Normal large bowel with no diverticulosis, large bowel wall thickening or pericolonic fat stranding. Vascular/Lymphatic: Atherosclerotic nonaneurysmal abdominal aorta. No pathologically enlarged lymph nodes in the abdomen or pelvis. Reproductive: Status post hysterectomy, with no abnormal findings at the vaginal cuff. No adnexal mass. Other: No pneumoperitoneum, ascites or focal fluid collection. Musculoskeletal: No aggressive appearing focal osseous lesions. Moderate lumbar spondylosis. Chronic 7 mm anterolisthesis at L4-5. IMPRESSION: 1. Stable exam. No new or progressive metastatic disease in the chest, abdomen or pelvis. Stable irregular bilateral upper lobe nodular opacities,  compatible with post treatment change. 2. Aortic Atherosclerosis (ICD10-I70.0) and Emphysema (ICD10-J43.9). Electronically Signed   By: Ilona Sorrel M.D.   On: 12/09/2020 09:35   DG Chest 2 View  Result Date: 11/10/2020 CLINICAL DATA:  Chest and shoulder pain in the left posterior region near the scapula. Pain for 2 days. History of therapy including chemotherapy for left upper lung carcinoma. EXAM: CHEST - 2 VIEW COMPARISON:  11/10/2020 at 11:56 a.m. FINDINGS: Cardiac silhouette is normal in size. Normal mediastinal and hilar contours. Right anterior chest wall Port-A-Cath is stable, tip in the lower superior vena cava. Reticular opacities noted in the left upper lobe faint likely scarring from treatment of previous lung carcinoma. There is blunting of the left lateral costophrenic sulcus consistent with pleural thickening. Remainder of the lungs is  clear. No convincing pleural effusion and no pneumothorax. Skeletal structures are intact. IMPRESSION: No acute cardiopulmonary disease. Electronically Signed   By: Lajean Manes M.D.   On: 11/10/2020 15:05   DG Ribs Unilateral W/Chest Left  Result Date: 11/10/2020 CLINICAL DATA:  Left shoulder blade pain EXAM: LEFT RIBS AND CHEST - 3 VIEW COMPARISON:  11/12/2019 FINDINGS: Right chest port with catheter tip in the SVC. No fracture or other bone lesions are seen involving the ribs or left scapula. There is no evidence of pneumothorax or pleural effusion. Both lungs are clear. Heart size and mediastinal contours are within normal limits. Aortic atherosclerotic calcifications. IMPRESSION: Negative. Electronically Signed   By: Merilyn Baba M.D.   On: 11/10/2020 12:37   CT Chest Wo Contrast  Result Date: 12/09/2020 CLINICAL DATA:  Non-small cell lung cancer, metastatic, assess treatment response; Metastatic disease evaluation. Brain metastases status post radiation therapy. Chemotherapy completed January 2021. Patient reports dyspnea and cough. EXAM: CT CHEST, ABDOMEN AND PELVIS WITHOUT CONTRAST TECHNIQUE: Multidetector CT imaging of the chest, abdomen and pelvis was performed following the standard protocol without IV contrast. COMPARISON:  10/07/2020 CT chest, abdomen and pelvis. FINDINGS: CT CHEST FINDINGS Cardiovascular: Normal heart size. Small chronic anterior pericardial effusion/thickening, stable. Three-vessel coronary atherosclerosis. Right internal jugular Port-A-Cath terminates at the cavoatrial junction. Atherosclerotic nonaneurysmal thoracic aorta. Normal caliber pulmonary arteries. Mediastinum/Nodes: No discrete thyroid nodules. Unremarkable esophagus. No pathologically enlarged axillary, mediastinal or hilar lymph nodes, noting limited sensitivity for the detection of hilar adenopathy on this noncontrast study. Lungs/Pleura: No pneumothorax. No pleural effusion. Mild centrilobular and paraseptal  emphysema. Irregular 0.9 cm left upper lobe (series 7/image 45) and 0.8 cm peripheral right upper lobe (series 7/image 39) nodular opacities, both stable using similar measurement technique. No acute consolidative airspace disease, lung masses or new significant pulmonary nodules. Musculoskeletal: No aggressive appearing focal osseous lesions. Mild thoracic spondylosis. Partially visualized surgical hardware from ACDF. CT ABDOMEN PELVIS FINDINGS Hepatobiliary: Normal liver with no liver mass. Normal gallbladder with no radiopaque cholelithiasis. No biliary ductal dilatation. Pancreas: Normal, with no mass or duct dilation. Spleen: Normal size. No mass. Adrenals/Urinary Tract: Normal adrenals. No contour deforming renal masses. No hydronephrosis. No renal stones. Normal bladder. Stomach/Bowel: Normal non-distended stomach. Normal caliber small bowel with no small bowel wall thickening. Normal appendix. Normal large bowel with no diverticulosis, large bowel wall thickening or pericolonic fat stranding. Vascular/Lymphatic: Atherosclerotic nonaneurysmal abdominal aorta. No pathologically enlarged lymph nodes in the abdomen or pelvis. Reproductive: Status post hysterectomy, with no abnormal findings at the vaginal cuff. No adnexal mass. Other: No pneumoperitoneum, ascites or focal fluid collection. Musculoskeletal: No aggressive appearing focal osseous lesions. Moderate lumbar spondylosis. Chronic 7 mm anterolisthesis at  L4-5. IMPRESSION: 1. Stable exam. No new or progressive metastatic disease in the chest, abdomen or pelvis. Stable irregular bilateral upper lobe nodular opacities, compatible with post treatment change. 2. Aortic Atherosclerosis (ICD10-I70.0) and Emphysema (ICD10-J43.9). Electronically Signed   By: Ilona Sorrel M.D.   On: 12/09/2020 09:35   MR BRAIN W WO CONTRAST  Result Date: 12/05/2020 CLINICAL DATA:  Brain/CNS neoplasm, assess treatment response; lung cancer EXAM: MRI HEAD WITHOUT AND WITH  CONTRAST TECHNIQUE: Multiplanar, multiecho pulse sequences of the brain and surrounding structures were obtained without and with intravenous contrast. CONTRAST:  62mL MULTIHANCE COMPARISON:  08/15/2020 FINDINGS: Brain: New punctate focus of enhancement in the right superior frontal gyrus (series 12, image 128). Increase in size of left temporal lesion measuring 7 mm (previously 3 mm). Stable previously described punctate focus of enhancement in the inferior left cerebellum, which does not represent an active metastasis. There is increased prominence of enhancement at the genu of the left facial nerve also present on more recent prior studies (series 12, image 41). No acute infarction. No hydrocephalus. Patchy foci of T2 hyperintensity in the supratentorial white matter may reflect minor chronic microvascular ischemic changes. Unchanged bilateral inferior cerebellar encephalomalacia. Vascular: Major vessel flow voids at the skull base are preserved. Skull and upper cervical spine: Normal marrow signal is preserved. Postsurgical changes of anterior fusion extending inferiorly from C3. Sinuses/Orbits: Paranasal sinus mucosal thickening. Orbits are unremarkable. Other: Sella is unremarkable.  Mastoid air cells are clear. IMPRESSION: Increase in size of left temporal metastasis. New punctate enhancement right frontal lobe could reflect a new metastasis. Increased prominence of enhancement at the genu of the left facial nerve also present on recent prior studies. Likely unrelated and may reflect a schwannoma. Electronically Signed   By: Macy Mis M.D.   On: 12/05/2020 15:17     ASSESSMENT AND PLAN: This is a very pleasant 65 years old African-American female with likely stage IV (T1c, N1, M1C) non-small cell lung cancer, adenocarcinoma with K-ras G12C mutation diagnosed in July 2020 and presented with left upper lobe lung nodule in addition to right upper lobe pulmonary nodule as well as left hilar adenopathy and  metastatic lesion to the brain. Molecular studies showed no actionable mutation and PDL 1 expression was 1%. The patient underwent SBRT to the metastatic brain lesion under the care of Dr. Isidore Moos. The patient is currently undergoing systemic chemotherapy with carboplatin for AUC of 5, Alimta 500 mg/M2 and Keytruda 200 mg IV every 3 weeks is status post 38 cycles.  Starting from cycle #5 she is on maintenance treatment with Alimta and Keytruda every 3 weeks with only Keytruda on the last few cycles because of the renal insufficiency. The patient continues to tolerate this treatment well with no concerning adverse effects. She had repeat CT scan of the chest, abdomen pelvis performed recently.  I personally and independently reviewed the scans and discussed the results with the patient today.  Her CT scan showed no concerning findings for disease progression.  She also had MRI of the brain that showed increase in size of left temporal metastasis with a suspicious punctate enhancement in the right frontal lobe.  The patient is followed by Dr. Isidore Moos for management of her brain lesions. I recommended for her to proceed with cycle #39 of her treatment with Keytruda today. The patient will come back for follow-up visit in 3 weeks for evaluation before the next cycle of her treatment. She was advised to call immediately if she has  any other concerning symptoms in the interval. The patient voices understanding of current disease status and treatment options and is in agreement with the current care plan. All questions were answered. The patient knows to call the clinic with any problems, questions or concerns. We can certainly see the patient much sooner if necessary.  Disclaimer: This note was dictated with voice recognition software. Similar sounding words can inadvertently be transcribed and may not be corrected upon review.

## 2020-12-11 ENCOUNTER — Ambulatory Visit: Payer: Medicare Other | Admitting: Internal Medicine

## 2020-12-11 DIAGNOSIS — Z6833 Body mass index (BMI) 33.0-33.9, adult: Secondary | ICD-10-CM | POA: Diagnosis not present

## 2020-12-11 DIAGNOSIS — I10 Essential (primary) hypertension: Secondary | ICD-10-CM | POA: Diagnosis not present

## 2020-12-11 DIAGNOSIS — C7931 Secondary malignant neoplasm of brain: Secondary | ICD-10-CM | POA: Diagnosis not present

## 2020-12-12 ENCOUNTER — Other Ambulatory Visit: Payer: Self-pay | Admitting: Cardiology

## 2020-12-12 ENCOUNTER — Telehealth: Payer: Self-pay | Admitting: Internal Medicine

## 2020-12-12 NOTE — Telephone Encounter (Signed)
Scheduled follow-up appointments per 1/12 los. Patient is aware. °

## 2020-12-16 ENCOUNTER — Ambulatory Visit
Admission: RE | Admit: 2020-12-16 | Discharge: 2020-12-16 | Disposition: A | Discharge status: Home / Self Care | Payer: Medicare Other | Source: Ambulatory Visit | Attending: Radiation Oncology | Admitting: Radiation Oncology

## 2020-12-16 ENCOUNTER — Encounter: Payer: Self-pay | Admitting: Radiation Oncology

## 2020-12-16 ENCOUNTER — Other Ambulatory Visit: Payer: Self-pay

## 2020-12-16 VITALS — BP 150/62 | HR 66 | Temp 97.6°F | Resp 20

## 2020-12-16 DIAGNOSIS — C7931 Secondary malignant neoplasm of brain: Secondary | ICD-10-CM | POA: Diagnosis not present

## 2020-12-16 DIAGNOSIS — C3490 Malignant neoplasm of unspecified part of unspecified bronchus or lung: Secondary | ICD-10-CM | POA: Diagnosis not present

## 2020-12-16 DIAGNOSIS — Z510 Encounter for antineoplastic radiation therapy: Secondary | ICD-10-CM | POA: Diagnosis not present

## 2020-12-16 NOTE — Progress Notes (Signed)
  Radiation Oncology         [phone] ________________________________  Name: NASHAY BRICKLEY MRN: [id number]  Date: 12/16/2020  DOB: 21-Jul-1955  Stereotactic Treatment Procedure Note  SPECIAL TREATMENT PROCEDURE  Outpatient    ICD-10-CM   1. Brain metastases ([place])  C79.31       3D TREATMENT PLANNING AND DOSIMETRY:  The patient's radiation plan was reviewed and approved by neurosurgery and radiation oncology prior to treatment.  It showed 3-dimensional radiation distributions overlaid onto the planning CT/MRI image set.  The [facility] for the target structures as well as the organs at risk were reviewed. The documentation of the 3D plan and dosimetry are filed in the radiation oncology EMR.  NARRATIVE:  DARELY BECKNELL was brought to the TrueBeam stereotactic radiation treatment machine and placed supine on the CT couch. The head frame was applied, and the patient was set up for stereotactic radiosurgery.  Neurosurgery was present for the set-up and delivery  SIMULATION VERIFICATION:  In the couch zero-angle position, the patient underwent Exactrac imaging using the Brainlab system with orthogonal KV images.  These were carefully aligned and repeated to confirm treatment position for each of the isocenters.  The Exactrac snap film verification was repeated at each couch angle.  SPECIAL TREATMENT PROCEDURE: [name] received stereotactic radiosurgery to the following targets:   3 mm right frontal metastasis was treated to 20 Gray in 1 fraction using 3D SRS technique.  6 MV flattening filter free photons were used. ExacTrac Snap verification was performed for each couch angle.  This constitutes a special treatment procedure due to the ablative dose delivered and the technical nature of treatment.  This highly technical modality of treatment ensures that the ablative dose is centered on the patient's tumor while sparing normal tissues from excessive dose and risk of detrimental  effects.  STEREOTACTIC TREATMENT MANAGEMENT:  Following delivery, the patient was transported to nursing in stable condition and monitored for possible acute effects.  Vital signs were recorded BP (!) 150/62 (BP Location: Left Arm, Patient Position: Sitting, Cuff Size: Normal)   Pulse 66   Temp 97.6 F (36.4 C)   Resp 20   SpO2 100% . The patient tolerated treatment without significant acute effects, and was discharged to home in stable condition.    PLAN: Follow-up in one month.  ________________________________   Eppie Gibson, MD

## 2020-12-16 NOTE — Procedures (Signed)
  Name: Carolyn [name]  MRN: [id number]  Date: 12/16/2020   DOB: 03-13-1955  Stereotactic Radiosurgery Operative Note  PRE-OPERATIVE DIAGNOSIS:  Solitary Brain Metastasis  POST-OPERATIVE DIAGNOSIS:  Solitary Brain Metastasis  PROCEDURE:  Stereotactic Radiosurgery  SURGEON:  Elaina Hoops, MD  NARRATIVE: The patient underwent a radiation treatment planning session in the radiation oncology simulation suite under the care of the radiation oncology physician and physicist.  I participated closely in the radiation treatment planning afterwards. The patient underwent planning CT which was fused to 3T high resolution MRI with 1 mm axial slices.  These images were fused on the planning system.  We contoured the gross target volumes and subsequently expanded this to yield the Planning Target Volume. I actively participated in the planning process.  I helped to define and review the target contours and also the contours of the optic pathway, eyes, brainstem and selected nearby organs at risk.  All the dose constraints for critical structures were reviewed and compared to AAPM Task Group 101.  The prescription dose conformity was reviewed.  I approved the plan electronically.    Accordingly, [name] was brought to the TrueBeam stereotactic radiation treatment linac and placed in the custom immobilization mask.  The patient was aligned according to the IR fiducial markers with BrainLab Exactrac, then orthogonal x-rays were used in ExacTrac with the 6DOF robotic table and the shifts were made to align the patient  [name] received stereotactic radiosurgery uneventfully.    The detailed description of the procedure is recorded in the radiation oncology procedure note.  I was present for the duration of the procedure.  DISPOSITION:  Following delivery, the patient was transported to nursing in stable condition and monitored for possible acute effects to be discharged to home in stable  condition with follow-up in one month.  Elaina Hoops, MD 12/16/2020 2:32 PM

## 2020-12-16 NOTE — Progress Notes (Signed)
Nurse monitoring complete status post 1 of 1 SRS treatments. Patient without complaints. Patient denies new or worsening neurologic symptoms. Vitals stable. Instructed patient to avoid strenuous activity for the next 24 hours. Instructed patient to call [phone] with needs related to treatment after hours or over the weekend. Patient verbalized understanding and agreement. Ambulated out of clinic unassisted without incident    Vitals:   12/16/20 1227  BP: (!) 150/62  Pulse: 66  Resp: 20  Temp: 97.6 F (36.4 C)  SpO2: 100%

## 2020-12-25 DIAGNOSIS — M549 Dorsalgia, unspecified: Secondary | ICD-10-CM | POA: Diagnosis not present

## 2020-12-25 DIAGNOSIS — M654 Radial styloid tenosynovitis [de Quervain]: Secondary | ICD-10-CM | POA: Diagnosis not present

## 2020-12-25 DIAGNOSIS — N1831 Chronic kidney disease, stage 3a: Secondary | ICD-10-CM | POA: Diagnosis not present

## 2020-12-25 DIAGNOSIS — M1990 Unspecified osteoarthritis, unspecified site: Secondary | ICD-10-CM | POA: Diagnosis not present

## 2020-12-25 DIAGNOSIS — R768 Other specified abnormal immunological findings in serum: Secondary | ICD-10-CM | POA: Diagnosis not present

## 2020-12-25 DIAGNOSIS — M25531 Pain in right wrist: Secondary | ICD-10-CM | POA: Diagnosis not present

## 2020-12-25 DIAGNOSIS — M2550 Pain in unspecified joint: Secondary | ICD-10-CM | POA: Diagnosis not present

## 2020-12-25 DIAGNOSIS — C3490 Malignant neoplasm of unspecified part of unspecified bronchus or lung: Secondary | ICD-10-CM | POA: Diagnosis not present

## 2020-12-31 ENCOUNTER — Other Ambulatory Visit: Payer: Self-pay

## 2020-12-31 ENCOUNTER — Inpatient Hospital Stay: Payer: Medicare Other

## 2020-12-31 ENCOUNTER — Inpatient Hospital Stay (HOSPITAL_BASED_OUTPATIENT_CLINIC_OR_DEPARTMENT_OTHER): Payer: Medicare Other | Admitting: Internal Medicine

## 2020-12-31 ENCOUNTER — Encounter: Payer: Self-pay | Admitting: Internal Medicine

## 2020-12-31 VITALS — BP 140/62 | HR 63 | Temp 98.2°F | Resp 18 | Ht 60.5 in | Wt 177.9 lb

## 2020-12-31 DIAGNOSIS — C3491 Malignant neoplasm of unspecified part of right bronchus or lung: Secondary | ICD-10-CM | POA: Diagnosis not present

## 2020-12-31 DIAGNOSIS — Z95828 Presence of other vascular implants and grafts: Secondary | ICD-10-CM

## 2020-12-31 DIAGNOSIS — C3412 Malignant neoplasm of upper lobe, left bronchus or lung: Secondary | ICD-10-CM | POA: Diagnosis not present

## 2020-12-31 DIAGNOSIS — R5383 Other fatigue: Secondary | ICD-10-CM

## 2020-12-31 DIAGNOSIS — Z5112 Encounter for antineoplastic immunotherapy: Secondary | ICD-10-CM

## 2020-12-31 DIAGNOSIS — C7931 Secondary malignant neoplasm of brain: Secondary | ICD-10-CM

## 2020-12-31 DIAGNOSIS — C3492 Malignant neoplasm of unspecified part of left bronchus or lung: Secondary | ICD-10-CM

## 2020-12-31 DIAGNOSIS — Z79899 Other long term (current) drug therapy: Secondary | ICD-10-CM | POA: Diagnosis not present

## 2020-12-31 LAB — CBC WITH DIFFERENTIAL (CANCER CENTER ONLY)
Abs Immature Granulocytes: 0.01 10*3/uL (ref 0.00–0.07)
Basophils Absolute: 0 10*3/uL (ref 0.0–0.1)
Basophils Relative: 0 %
Eosinophils Absolute: 0.5 10*3/uL (ref 0.0–0.5)
Eosinophils Relative: 7 %
HCT: 37 % (ref 36.0–46.0)
Hemoglobin: 11.2 g/dL — ABNORMAL LOW (ref 12.0–15.0)
Immature Granulocytes: 0 %
Lymphocytes Relative: 19 %
Lymphs Abs: 1.3 10*3/uL (ref 0.7–4.0)
MCH: 23.7 pg — ABNORMAL LOW (ref 26.0–34.0)
MCHC: 30.3 g/dL (ref 30.0–36.0)
MCV: 78.4 fL — ABNORMAL LOW (ref 80.0–100.0)
Monocytes Absolute: 0.6 10*3/uL (ref 0.1–1.0)
Monocytes Relative: 9 %
Neutro Abs: 4.4 10*3/uL (ref 1.7–7.7)
Neutrophils Relative %: 65 %
Platelet Count: 272 10*3/uL (ref 150–400)
RBC: 4.72 MIL/uL (ref 3.87–5.11)
RDW: 17.2 % — ABNORMAL HIGH (ref 11.5–15.5)
WBC Count: 6.9 10*3/uL (ref 4.0–10.5)
nRBC: 0 % (ref 0.0–0.2)

## 2020-12-31 LAB — CMP (CANCER CENTER ONLY)
ALT: 12 U/L (ref 0–44)
AST: 18 U/L (ref 15–41)
Albumin: 3.4 g/dL — ABNORMAL LOW (ref 3.5–5.0)
Alkaline Phosphatase: 86 U/L (ref 38–126)
Anion gap: 9 (ref 5–15)
BUN: 15 mg/dL (ref 8–23)
CO2: 24 mmol/L (ref 22–32)
Calcium: 8.8 mg/dL — ABNORMAL LOW (ref 8.9–10.3)
Chloride: 108 mmol/L (ref 98–111)
Creatinine: 1.68 mg/dL — ABNORMAL HIGH (ref 0.44–1.00)
GFR, Estimated: 33 mL/min — ABNORMAL LOW (ref >60)
Glucose, Bld: 155 mg/dL — ABNORMAL HIGH (ref 70–99)
Potassium: 4.2 mmol/L (ref 3.5–5.1)
Sodium: 141 mmol/L (ref 135–145)
Total Bilirubin: 0.3 mg/dL (ref 0.3–1.2)
Total Protein: 7.1 g/dL (ref 6.5–8.1)

## 2020-12-31 LAB — TSH: TSH: 1.864 u[IU]/mL (ref 0.308–3.960)

## 2020-12-31 MED ORDER — SODIUM CHLORIDE 0.9 % IV SOLN
200.0000 mg | Freq: Once | INTRAVENOUS | Status: AC
  Administered 2020-12-31: 200 mg via INTRAVENOUS
  Filled 2020-12-31: qty 8

## 2020-12-31 MED ORDER — SODIUM CHLORIDE 0.9% FLUSH
10.0000 mL | INTRAVENOUS | Status: DC | PRN
  Administered 2020-12-31: 10 mL

## 2020-12-31 MED ORDER — HEPARIN SOD (PORK) LOCK FLUSH 100 UNIT/ML IV SOLN
500.0000 [IU] | Freq: Once | INTRAVENOUS | Status: AC | PRN
  Administered 2020-12-31: 500 [IU]

## 2020-12-31 MED ORDER — SODIUM CHLORIDE 0.9 % IV SOLN: Freq: Once | INTRAVENOUS | Status: AC

## 2020-12-31 NOTE — Progress Notes (Signed)
Per Dr. Julien Nordmann , it is ok to treat pt today with Keytruda and serum creatinine of 1.68.

## 2020-12-31 NOTE — Patient Instructions (Signed)
[facility]   Discharge Instructions: Thank you for choosing [facility] to provide your oncology and hematology care.   If you have a lab appointment with the [facility], please go directly to the [facility] and check in at the registration area.   Wear comfortable clothing and clothing appropriate for easy access to any Portacath or PICC line.   We strive to give you quality time with your provider. You may need to reschedule your appointment if you arrive late (15 or more minutes).  Arriving late affects you and other patients whose appointments are after yours.  Also, if you miss three or more appointments without notifying the office, you may be dismissed from the clinic at the provider's discretion.      For prescription refill requests, have your pharmacy contact our office and allow 72 hours for refills to be completed.    Today you received the following chemotherapy and/or immunotherapy agents: pembrolizumab.      To help prevent nausea and vomiting after your treatment, we encourage you to take your nausea medication as directed.  BELOW ARE SYMPTOMS THAT SHOULD BE REPORTED IMMEDIATELY: *FEVER GREATER THAN 100.4 F (38 C) OR HIGHER *CHILLS OR SWEATING *NAUSEA AND VOMITING THAT IS NOT CONTROLLED WITH YOUR NAUSEA MEDICATION *UNUSUAL SHORTNESS OF BREATH *UNUSUAL BRUISING OR BLEEDING *URINARY PROBLEMS (pain or burning when urinating, or frequent urination) *BOWEL PROBLEMS (unusual diarrhea, constipation, pain near the anus) TENDERNESS IN MOUTH AND THROAT WITH OR WITHOUT PRESENCE OF ULCERS (sore throat, sores in mouth, or a toothache) UNUSUAL RASH, SWELLING OR PAIN  UNUSUAL VAGINAL DISCHARGE OR ITCHING   Items with * indicate a potential emergency and should be followed up as soon as possible or go to the Emergency Department if any problems should occur.  Please show the CHEMOTHERAPY ALERT CARD or IMMUNOTHERAPY ALERT CARD at  check-in to the Emergency Department and triage nurse.  Should you have questions after your visit or need to cancel or reschedule your appointment, please contact [facility]  Dept: [phone]  and follow the prompts.  Office hours are 8:00 a.m. to 4:30 p.m. Monday - Friday. Please note that voicemails left after 4:00 p.m. may not be returned until the following business day.  We are closed weekends and major holidays. You have access to a nurse at all times for urgent questions. Please call the main number to the clinic Dept: [phone] and follow the prompts.   For any non-urgent questions, you may also contact your provider using MyChart. We now offer e-Visits for anyone 32 and older to request care online for non-urgent symptoms. For details visit [email or web address].   Also download the MyChart app! Go to the app store, search "MyChart", open the app, select [facility], and log in with your MyChart username and password.  Due to Covid, a mask is required upon entering the hospital/clinic. If you do not have a mask, one will be given to you upon arrival. For doctor visits, patients may have 1 support person aged 63 or older with them. For treatment visits, patients cannot have anyone with them due to current Covid guidelines and our immunocompromised population.

## 2020-12-31 NOTE — Progress Notes (Signed)
[facility] Telephone:(336) [phone]   [phone]  OFFICE PROGRESS NOTE  [name], [place] [address]  DIAGNOSIS: Stage IV (T1c, N1, M1c ) non-small cell lung cancer, adenocarcinoma diagnosed in July 2020 and presented with left upper lobe lung nodule in addition to right upper lobe lung nodule with left hilar lymphadenopathy as well as multiple brain metastasis.  Biomarker Findings Microsatellite status - MS-Stable Tumor Mutational Burden - 4 Muts/Mb Genomic Findings For a complete list of the genes assayed, please refer to the Appendix. ATM G204* KRAS G12C PDGFRA P122T SMO R199W 7 Disease relevant genes with no reportable alterations: ALK, BRAF, EGFR, ERBB2, MET, RET, ROS1  PDL 1 expression was 1%  PRIOR THERAPY:  1) SRS to multiple brain metastasis under the care of Dr. Isidore Moos. 2) SRS to a 4 mm new brain metastasis under the care of Dr. Isidore Moos and Dr. Saintclair Halsted on 09/02/2020  CURRENT THERAPY: Systemic chemotherapy with carboplatin for AUC of 5, Alimta 500 mg/M2 and Keytruda 200 mg IV every 3 weeks.  First dose October 05, 2018.  Status post 39 cycles.  Starting from cycle #5 the patient will be on treatment with maintenance Alimta 500 mg/M2 and Keytruda 200 mg IV every 3 weeks.  She has been on treatment with single agent Keytruda recently secondary to renal insufficiency.  INTERVAL HISTORY: Carolyn [name] 65 y.o. female returns to the clinic today for follow-up visit.  The patient is feeling fine today with no concerning complaints except for mild fatigue and intermittent swelling of her lower extremities. She denied having any chest pain, shortness of breath, cough or hemoptysis.  She denied having any fever or chills.  She has no nausea, vomiting, diarrhea or constipation.  She has no headache or visual changes.  She has no significant weight loss or night sweats.  She is here today for evaluation before  starting cycle #40 of her treatment.   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  MEDICATIONS:  Current Outpatient Medications  Medication Sig Dispense Refill   acetaminophen (TYLENOL) 500 MG tablet Take 1,000 mg by mouth every 8 (eight) hours as needed for mild pain, fever or headache.      amLODipine (NORVASC) 10 MG tablet Take 1 tablet (10 mg total) by mouth daily. 90 tablet 3   aspirin EC 81 MG tablet Take 81 mg by mouth daily.     carvedilol (COREG) 25 MG tablet Take 1/2 (one-half) tablet by mouth twice daily 180 tablet 0   Cholecalciferol (VITAMIN D-3) 125 MCG (5000 UT) TABS Take 5,000 Units  by mouth daily.     Chromium Picolinate 1000 MCG TABS Take 1,000 mg by mouth daily.     empagliflozin (JARDIANCE) 25 MG TABS tablet Take 1 tablet (25 mg total) by mouth daily before breakfast. 90 tablet 3   esomeprazole (NEXIUM) 20 MG capsule Take 1 capsule by mouth daily.     ezetimibe (ZETIA) 10 MG tablet TAKE 1 TABLET BY MOUTH ONCE DAILY AFTER SUPPER 90 tablet 3   fexofenadine (ALLEGRA) 180 MG tablet 1 tablet swallow whole with water; do not take with fruit juices.     Lancets (ONETOUCH DELICA PLUS [id number]) MISC SMARTSIG:1 Topical 3 Times Daily     lidocaine-prilocaine (EMLA) cream Apply to the Port-A-Cath site 30 to 60 minutes before chemotherapy. 30 g 2    metFORMIN (GLUCOPHAGE) 500 MG tablet Take 500 mg by mouth daily after breakfast.     methocarbamol (ROBAXIN) 500 MG tablet Take 1 tablet (500 mg total) by mouth every 6 (six) hours as needed for muscle spasms. 21 tablet 0   nitroGLYCERIN (NITROSTAT) 0.4 MG SL tablet Place 1 tablet (0.4 mg total) under the tongue every 5 (five) minutes as needed for chest pain. 25 tablet 2   ONETOUCH ULTRA test strip USE 1 STRIP TO CHECK GLUCOSE THREE TIMES DAILY     Pitavastatin Calcium (LIVALO) 4 MG TABS Take 4 mg by mouth at bedtime.      telmisartan (MICARDIS) 40 MG tablet Take 1 tablet by mouth daily.     No current facility-administered medications for this visit.   Facility-Administered Medications Ordered in Other Visits  Medication Dose Route Frequency Provider Last Rate Last Admin   sodium chloride flush (NS) 0.9 % injection 10 mL  10 mL Intracatheter PRN Curt Bears, MD   10 mL at 12/31/20 8527    SURGICAL HISTORY:  Past Surgical History:  Procedure Laterality Date   ABDOMINAL HYSTERECTOMY     bone spur     CARDIAC CATHETERIZATION  06/13/2012   carpel tunnel surgery Left    COLONOSCOPY     9 years ago   CORONARY ANGIOPLASTY  06/13/2012   EYE SURGERY     cyst left eye    IR IMAGING GUIDED PORT INSERTION  10/10/2018   LEFT HEART CATHETERIZATION WITH CORONARY ANGIOGRAM N/A 06/13/2012   Procedure: LEFT HEART CATHETERIZATION WITH CORONARY ANGIOGRAM;  Surgeon: Laverda Page, MD;  Location: [facility] CATH LAB;  Service: Cardiovascular;  Laterality: N/A;   NECK SURGERY     rotator cuff surgery Right 2015   VIDEO BRONCHOSCOPY WITH ENDOBRONCHIAL NAVIGATION N/A 09/06/2018   Procedure: VIDEO BRONCHOSCOPY WITH ENDOBRONCHIAL NAVIGATION, left upper lung;  Surgeon: Lajuana Matte, MD;  Location: [facility];  Service: Thoracic;  Laterality: N/A;   VIDEO BRONCHOSCOPY WITH ENDOBRONCHIAL ULTRASOUND Left 09/06/2018   Procedure: VIDEO BRONCHOSCOPY WITH ENDOBRONCHIAL ULTRASOUND, left lung;  Surgeon: Lajuana Matte, MD;  Location: [facility];  Service: Thoracic;  Laterality: Left;   WISDOM TOOTH EXTRACTION      REVIEW OF SYSTEMS:  A comprehensive review of systems was negative except for: Constitutional: positive for fatigue   PHYSICAL EXAMINATION: General appearance: alert, cooperative, fatigued, and no distress Head: Normocephalic, without obvious abnormality, atraumatic Neck: no adenopathy, no JVD, supple, symmetrical, trachea midline, and thyroid not enlarged, symmetric, no tenderness/mass/nodules Lymph nodes: Cervical, supraclavicular, and axillary nodes normal. Resp: clear to auscultation bilaterally Back: symmetric, no curvature. ROM normal. No CVA tenderness. Cardio: regular rate and rhythm, S1, S2 normal, no murmur, click, rub or  gallop GI: soft, non-tender; bowel sounds normal; no masses,  no organomegaly Extremities: extremities normal, atraumatic, no cyanosis or edema  ECOG PERFORMANCE STATUS: 1 - Symptomatic but completely ambulatory  Blood pressure 140/62, pulse 63, temperature 98.2 F (36.8 C), temperature source Oral, resp. rate 18, height 5' 0.5" (1.537 m), weight 177 lb 14.4 oz (80.7 kg), SpO2 100 %.  LABORATORY DATA: Lab Results  Component Value Date   WBC 7.4 12/10/2020   HGB 11.7 (L) 12/10/2020   HCT 38.2 12/10/2020   MCV 77.0 (L) 12/10/2020   PLT 281 12/10/2020      Chemistry      Component Value Date/Time   NA 143 12/10/2020 0747   NA 139 08/18/2020 0801   K 3.9 12/10/2020 0747   CL 108 12/10/2020 0747   CO2 22 12/10/2020 0747   BUN 16 12/10/2020 0747   BUN 16 08/18/2020 0801   CREATININE 1.41 (H) 12/10/2020 0747      Component Value Date/Time   CALCIUM 9.0 12/10/2020 0747   ALKPHOS 86 12/10/2020 0747   AST 22 12/10/2020 0747   ALT 14 12/10/2020 0747   BILITOT 0.3 12/10/2020 0747       RADIOGRAPHIC STUDIES: CT Abdomen Pelvis Wo Contrast  Result Date: 12/09/2020 CLINICAL DATA:  Non-small cell lung cancer, metastatic, assess treatment response;  Metastatic disease evaluation. Brain metastases status post radiation therapy. Chemotherapy completed January 2021. Patient reports dyspnea and cough. EXAM: CT CHEST, ABDOMEN AND PELVIS WITHOUT CONTRAST TECHNIQUE: Multidetector CT imaging of the chest, abdomen and pelvis was performed following the standard protocol without IV contrast. COMPARISON:  10/07/2020 CT chest, abdomen and pelvis. FINDINGS: CT CHEST FINDINGS Cardiovascular: Normal heart size. Small chronic anterior pericardial effusion/thickening, stable. Three-vessel coronary atherosclerosis. Right internal jugular Port-A-Cath terminates at the cavoatrial junction. Atherosclerotic nonaneurysmal thoracic aorta. Normal caliber pulmonary arteries. Mediastinum/Nodes: No discrete thyroid nodules. Unremarkable esophagus. No pathologically enlarged axillary, mediastinal or hilar lymph nodes, noting limited sensitivity for the detection of hilar adenopathy on this noncontrast study. Lungs/Pleura: No pneumothorax. No pleural effusion. Mild centrilobular and paraseptal emphysema. Irregular 0.9 cm left upper lobe (series 7/image 45) and 0.8 cm peripheral right upper lobe (series 7/image 39) nodular opacities, both stable using similar measurement technique. No acute consolidative airspace disease, lung masses or new significant pulmonary nodules. Musculoskeletal: No aggressive appearing focal osseous lesions. Mild thoracic spondylosis. Partially visualized surgical hardware from ACDF. CT ABDOMEN PELVIS FINDINGS Hepatobiliary: Normal liver with no liver mass. Normal gallbladder with no radiopaque cholelithiasis. No biliary ductal dilatation. Pancreas: Normal, with no mass or duct dilation. Spleen: Normal size. No mass. Adrenals/Urinary Tract: Normal adrenals. No contour deforming renal masses. No hydronephrosis. No renal stones. Normal bladder. Stomach/Bowel: Normal non-distended stomach. Normal caliber small bowel with no small bowel wall thickening. Normal appendix.  Normal large bowel with no diverticulosis, large bowel wall thickening or pericolonic fat stranding. Vascular/Lymphatic: Atherosclerotic nonaneurysmal abdominal aorta. No pathologically enlarged lymph nodes in the abdomen or pelvis. Reproductive: Status post hysterectomy, with no abnormal findings at the vaginal cuff. No adnexal mass. Other: No pneumoperitoneum, ascites or focal fluid collection. Musculoskeletal: No aggressive appearing focal osseous lesions. Moderate lumbar spondylosis. Chronic 7 mm anterolisthesis at L4-5. IMPRESSION: 1. Stable exam. No new or progressive metastatic disease in the chest, abdomen or pelvis. Stable irregular bilateral upper lobe nodular opacities, compatible with post treatment change. 2. Aortic Atherosclerosis (ICD10-I70.0) and Emphysema (ICD10-J43.9). Electronically Signed   By: Ilona Sorrel M.D.   On: 12/09/2020 09:35   CT Chest Wo Contrast  Result Date: 12/09/2020 CLINICAL DATA:  Non-small cell lung cancer, metastatic, assess treatment response; Metastatic disease evaluation. Brain metastases status post radiation therapy. Chemotherapy completed January 2021. Patient reports dyspnea and cough. EXAM: CT CHEST, ABDOMEN AND PELVIS WITHOUT CONTRAST TECHNIQUE: Multidetector CT imaging of the chest, abdomen and pelvis was performed following the standard protocol without IV contrast. COMPARISON:  10/07/2020 CT chest, abdomen and pelvis. FINDINGS: CT CHEST FINDINGS Cardiovascular: Normal heart size. Small chronic anterior pericardial effusion/thickening, stable. Three-vessel coronary atherosclerosis. Right internal jugular Port-A-Cath terminates at the cavoatrial junction. Atherosclerotic nonaneurysmal thoracic aorta. Normal caliber pulmonary arteries. Mediastinum/Nodes: No discrete thyroid nodules. Unremarkable esophagus. No pathologically enlarged axillary, mediastinal or hilar lymph nodes, noting limited sensitivity for the detection of hilar adenopathy on this noncontrast  study. Lungs/Pleura: No pneumothorax. No pleural effusion. Mild centrilobular and paraseptal emphysema. Irregular 0.9 cm left upper lobe (series 7/image 45) and 0.8 cm peripheral right upper lobe (series 7/image 39) nodular opacities, both stable using similar measurement technique. No acute consolidative airspace disease, lung masses or new significant pulmonary nodules. Musculoskeletal: No aggressive appearing focal osseous lesions. Mild thoracic spondylosis. Partially visualized surgical hardware from ACDF. CT ABDOMEN PELVIS FINDINGS Hepatobiliary: Normal liver with no liver mass. Normal gallbladder with no radiopaque cholelithiasis. No biliary ductal dilatation. Pancreas: Normal, with no mass or duct dilation. Spleen: Normal size. No mass. Adrenals/Urinary Tract: Normal adrenals. No contour deforming renal masses. No hydronephrosis. No renal stones. Normal bladder. Stomach/Bowel: Normal non-distended stomach. Normal caliber small bowel with no small bowel wall thickening. Normal appendix. Normal large bowel with no diverticulosis, large bowel wall thickening or pericolonic fat stranding. Vascular/Lymphatic: Atherosclerotic nonaneurysmal abdominal aorta. No pathologically enlarged lymph nodes in the abdomen or pelvis. Reproductive: Status post hysterectomy, with no abnormal findings at the vaginal cuff. No adnexal mass. Other: No pneumoperitoneum, ascites or focal fluid collection. Musculoskeletal: No aggressive appearing focal osseous lesions. Moderate lumbar spondylosis. Chronic 7 mm anterolisthesis at L4-5. IMPRESSION: 1. Stable exam. No new or progressive metastatic disease in the chest, abdomen or pelvis. Stable irregular bilateral upper lobe nodular opacities, compatible with post treatment change. 2. Aortic Atherosclerosis (ICD10-I70.0) and Emphysema (ICD10-J43.9). Electronically Signed   By: Ilona Sorrel M.D.   On: 12/09/2020 09:35   MR BRAIN W WO CONTRAST  Result Date: 12/05/2020 CLINICAL DATA:   Brain/CNS neoplasm, assess treatment response; lung cancer EXAM: MRI HEAD WITHOUT AND WITH CONTRAST TECHNIQUE: Multiplanar, multiecho pulse sequences of the brain and surrounding structures were obtained without and with intravenous contrast. CONTRAST:  11mL MULTIHANCE COMPARISON:  08/15/2020 FINDINGS: Brain: New punctate focus of enhancement in the right superior frontal gyrus (series 12, image 128). Increase in size of left temporal lesion measuring 7 mm (previously 3 mm). Stable previously described punctate focus of enhancement in the inferior left cerebellum, which does not represent an active metastasis. There is increased prominence of enhancement at the genu of the left facial nerve also present on more recent prior studies (series 12, image 41). No acute infarction. No hydrocephalus. Patchy foci of T2 hyperintensity in the supratentorial white matter may reflect minor chronic microvascular ischemic changes. Unchanged bilateral inferior cerebellar encephalomalacia. Vascular: Major vessel flow voids at the skull base are preserved. Skull and upper cervical spine: Normal marrow signal is preserved. Postsurgical changes of anterior fusion extending inferiorly from C3. Sinuses/Orbits: Paranasal sinus mucosal thickening. Orbits are unremarkable. Other: Sella is unremarkable.  Mastoid air cells are clear. IMPRESSION: Increase in size of left temporal metastasis. New punctate enhancement right frontal lobe could reflect a  new metastasis. Increased prominence of enhancement at the genu of the left facial nerve also present on recent prior studies. Likely unrelated and may reflect a schwannoma. Electronically Signed   By: Macy Mis M.D.   On: 12/05/2020 15:17     ASSESSMENT AND PLAN: This is a very pleasant 65 years old African-American female with likely stage IV (T1c, N1, M1C) non-small cell lung cancer, adenocarcinoma with K-ras G12C mutation diagnosed in July 2020 and presented with left upper lobe lung  nodule in addition to right upper lobe pulmonary nodule as well as left hilar adenopathy and metastatic lesion to the brain. Molecular studies showed no actionable mutation and PDL 1 expression was 1%. The patient underwent SBRT to the metastatic brain lesion under the care of Dr. Isidore Moos. The patient is currently undergoing systemic chemotherapy with carboplatin for AUC of 5, Alimta 500 mg/M2 and Keytruda 200 mg IV every 3 weeks is status post 39 cycles.  Starting from cycle #5 she is on maintenance treatment with Alimta and Keytruda every 3 weeks with only Keytruda on the last few cycles because of the renal insufficiency. The patient continues to tolerate her treatment fairly well with no concerning adverse effects. I recommended for her to proceed with cycle #40 today as planned. She will come back for follow-up visit in 3 weeks for evaluation before the next cycle of her treatment. She was advised to call immediately if she has any other concerning symptoms in the interval. The patient voices understanding of current disease status and treatment options and is in agreement with the current care plan. All questions were answered. The patient knows to call the clinic with any problems, questions or concerns. We can certainly see the patient much sooner if necessary.  Disclaimer: This note was dictated with voice recognition software. Similar sounding words can inadvertently be transcribed and may not be corrected upon review.

## 2021-01-08 DIAGNOSIS — Z23 Encounter for immunization: Secondary | ICD-10-CM | POA: Diagnosis not present

## 2021-01-15 DIAGNOSIS — Z20822 Contact with and (suspected) exposure to covid-19: Secondary | ICD-10-CM | POA: Diagnosis not present

## 2021-01-16 ENCOUNTER — Ambulatory Visit
Admission: RE | Admit: 2021-01-16 | Discharge: 2021-01-16 | Disposition: A | Discharge status: Home / Self Care | Payer: Medicare Other | Source: Ambulatory Visit | Attending: Radiation Oncology | Admitting: Radiation Oncology

## 2021-01-16 ENCOUNTER — Other Ambulatory Visit: Payer: Self-pay

## 2021-01-16 ENCOUNTER — Encounter: Payer: Self-pay | Admitting: Radiation Oncology

## 2021-01-16 VITALS — BP 146/71 | HR 55 | Temp 96.7°F | Resp 18 | Ht 60.5 in | Wt 179.2 lb

## 2021-01-16 DIAGNOSIS — C7931 Secondary malignant neoplasm of brain: Secondary | ICD-10-CM | POA: Diagnosis not present

## 2021-01-16 DIAGNOSIS — Z79899 Other long term (current) drug therapy: Secondary | ICD-10-CM | POA: Diagnosis not present

## 2021-01-16 DIAGNOSIS — Z7982 Long term (current) use of aspirin: Secondary | ICD-10-CM | POA: Insufficient documentation

## 2021-01-16 DIAGNOSIS — Z923 Personal history of irradiation: Secondary | ICD-10-CM | POA: Diagnosis not present

## 2021-01-16 DIAGNOSIS — Z7984 Long term (current) use of oral hypoglycemic drugs: Secondary | ICD-10-CM | POA: Insufficient documentation

## 2021-01-16 DIAGNOSIS — C3412 Malignant neoplasm of upper lobe, left bronchus or lung: Secondary | ICD-10-CM | POA: Insufficient documentation

## 2021-01-16 NOTE — Progress Notes (Signed)
Radiation Oncology         [phone] ________________________________  Name: Carolyn [name] MRN: [id number]  Date: 01/16/2021  DOB: 1955/09/07  Follow-Up Visit Note  Outpatient  CC: [name], FNP  Curt Bears, MD  Diagnosis and Prior Radiotherapy:    ICD-10-CM   1. Brain metastases ([place])  C79.31        CHIEF COMPLAINT: Here for follow-up and surveillance of brain cancer  Narrative:  The patient returns today for routine follow-up.  Carolyn [name] presents today for follow-up after completing [place] treatment on 12/16/2020 and 09/02/2020  Dose of Decadron, if applicable: Not currently prescribed   Recent neurologic symptoms, if any:  Seizures: Patient denies Headaches: Patient denies Nausea: Patient denies; reports a healthy appetite  Wt Readings from Last 3 Encounters:  01/16/21 179 lb 4 oz (81.3 kg)  12/31/20 177 lb 14.4 oz (80.7 kg)  12/10/20 174 lb 9.6 oz (79.2 kg)   Dizziness/ataxia: Reports occasional/rare feeling of dizziness/off balance. Denies any falls or impact on daily activities Difficulty with hand coordination: Patient denies Focal numbness/weakness: Patient denies Visual deficits/changes: Patient denies Confusion/Memory deficits: Patient denies  Additional Complaints / other details: Last saw Dr. Curt Bears on 12/31/2020, and will F/U with [facility] on 01/21/2021; continues to receive maintenance pembrolizumab. Overall she reports she feels good and is doing well. Denies any new issues or concerns                              ALLERGIES:  is allergic to atorvastatin, crestor [rosuvastatin], and lisinopril.  Meds: Current Outpatient Medications  Medication Sig Dispense Refill   acetaminophen (TYLENOL) 500 MG tablet Take 1,000 mg by mouth every 8 (eight) hours as needed for mild pain, fever or headache.      amLODipine (NORVASC) 10 MG tablet Take 1 tablet (10 mg total) by mouth daily. 90 tablet 3   aspirin EC 81 MG tablet Take 81 mg  by mouth daily.     carvedilol (COREG) 25 MG tablet Take 1/2 (one-half) tablet by mouth twice daily 180 tablet 0   Cholecalciferol (VITAMIN D-3) 125 MCG (5000 UT) TABS Take 5,000 Units by mouth daily.     Chromium Picolinate 1000 MCG TABS Take 1,000 mg by mouth daily.     empagliflozin (JARDIANCE) 25 MG TABS tablet Take 1 tablet (25 mg total) by mouth daily before breakfast. 90 tablet 3   esomeprazole (NEXIUM) 20 MG capsule Take 1 capsule by mouth daily.     ezetimibe (ZETIA) 10 MG tablet TAKE 1 TABLET BY MOUTH ONCE DAILY AFTER SUPPER 90 tablet 3   fexofenadine (ALLEGRA) 180 MG tablet 1 tablet swallow whole with water; do not take with fruit juices.     Lancets (ONETOUCH DELICA PLUS [id number]) MISC SMARTSIG:1 Topical 3 Times Daily     lidocaine-prilocaine (EMLA) cream Apply to the Port-A-Cath site 30 to 60 minutes before chemotherapy. 30 g 2   metFORMIN (GLUCOPHAGE) 500 MG tablet Take 500 mg by mouth daily after breakfast.     methocarbamol (ROBAXIN) 500 MG tablet Take 1 tablet (500 mg total) by mouth every 6 (six) hours as needed for muscle spasms. 21 tablet 0   nitroGLYCERIN (NITROSTAT) 0.4 MG SL tablet Place 1 tablet (0.4 mg total) under the tongue every 5 (five) minutes as needed for chest pain. 25 tablet 2   ONETOUCH ULTRA test strip USE 1 STRIP TO CHECK GLUCOSE THREE TIMES DAILY  Pitavastatin Calcium (LIVALO) 4 MG TABS Take 4 mg by mouth at bedtime.      telmisartan (MICARDIS) 40 MG tablet Take 1 tablet by mouth daily.     No current facility-administered medications for this encounter.    Physical Findings: The patient is in no acute distress. Patient is alert and oriented.  height is 5' 0.5" (1.537 m) and weight is 179 lb 4 oz (81.3 kg). Her temporal temperature is 96.7 F (35.9 C) (abnormal). Her blood pressure is 146/71 (abnormal) and her pulse is 55 (abnormal). Her respiration is 18 and oxygen saturation is 100%. .    General: Alert and oriented, in no acute distress HEENT:  Head is normocephalic. Extraocular movements are intact. Oropharynx is clear. Neurologic: Cranial nerves II through XII are grossly intact. No obvious focalities. Speech is fluent. Coordination is intact. Psychiatric: Judgment and insight are intact. Affect is appropriate.    Lab Findings: Lab Results  Component Value Date   WBC 6.9 12/31/2020   HGB 11.2 (L) 12/31/2020   HCT 37.0 12/31/2020   MCV 78.4 (L) 12/31/2020   PLT 272 12/31/2020    Radiographic Findings: No results found.  Impression/Plan: She is doing well after radiosurgery to the brain.  She will continue to follow-up with medical oncology and we will arrange an MRI of her brain in 2 months prior to neurosurgery follow-up.  I reminded her of the importance of staying up-to-date on all of her vaccinations.  She plans to get her COVID booster next week.  She has been vaccinated against flu.  On date of service, in total, I spent 15 minutes on this encounter. Patient was seen in person.  _____________________________________   Eppie Gibson, MD

## 2021-01-16 NOTE — Progress Notes (Signed)
Carolyn [name] presents today for follow-up after completing [place] treatment on 12/16/2020 and 09/02/2020  Dose of Decadron, if applicable: Not currently prescribed   Recent neurologic symptoms, if any:  Seizures: Patient denies Headaches: Patient denies Nausea: Patient denies; reports a healthy appetite  Wt Readings from Last 3 Encounters:  01/16/21 179 lb 4 oz (81.3 kg)  12/31/20 177 lb 14.4 oz (80.7 kg)  12/10/20 174 lb 9.6 oz (79.2 kg)   Dizziness/ataxia: Reports occasional/rare feeling of dizziness/off balance. Denies any falls or impact on daily activities Difficulty with hand coordination: Patient denies Focal numbness/weakness: Patient denies Visual deficits/changes: Patient denies Confusion/Memory deficits: Patient denies  Additional Complaints / other details: Last saw Dr. Curt Bears on 12/31/2020, and will F/U with [facility] on 01/21/2021; continues to receive maintenance pembrolizumab. Overall she reports she feels good and is doing well. Denies any new issues or concerns

## 2021-01-16 NOTE — Progress Notes (Signed)
[facility] OFFICE PROGRESS NOTE  [name], [place] [address]  DIAGNOSIS:  Stage IV (T1c, N1, M1c ) non-small cell lung cancer, adenocarcinoma diagnosed in July 2020 and presented with left upper lobe lung nodule in addition to right upper lobe lung nodule with left hilar lymphadenopathy as well as multiple brain metastasis.   Biomarker Findings Microsatellite status - MS-Stable Tumor Mutational Burden - 4 Muts/Mb Genomic Findings For a complete list of the genes assayed, please refer to the Appendix. ATM G204* KRAS G12C PDGFRA P122T SMO R199W 7 Disease relevant genes with no reportable alterations: ALK, BRAF, EGFR, ERBB2, MET, RET, ROS1   PDL 1 expression was 1%  PRIOR THERAPY: 1) SBRT to multiple brain metastasis under the care of Dr. Isidore Moos. Completed in August 2020 2) SRS to the new metastatic brain lesions under the care of Dr. Saintclair Halsted and Dr. Isidore Moos on 09/02/20 and 12/16/20  CURRENT THERAPY: Systemic chemotherapy with carboplatin for AUC of 5, Alimta 500 mg/M2 and Keytruda 200 mg IV every 3 weeks.  First dose October 05, 2018.  Status post 40cycles.  Starting from cycle #5 the patient will be on treatment with maintenance Alimta 500 mg/M2 and Keytruda 200 mg IV every 3 weeks.  She has been on treatment with single agent Keytruda secondary to renal insufficiency    INTERVAL HISTORY: Carolyn [name] 65 y.o. female returns to the clinic today for a follow up visit. The patient is feeling fairly well today without any concerning complaints. Her chemotherapy with alimta was discontinued due to renal insuffiencey for which she is followed by nephrology. She has a history of diabetes as well as hypertension. She is scheduled to see them in January. She mentions that she notices that her urine sometimes has bubbles in it and she is wondering what that means. She denies dysuria, malodorous urine, or cloudy urine. She tolerated her  last cycle of treatment with single agent immunotherapy with Keytruda well without any concerning adverse side effects. The patient denies any recent fever, chills, or weight loss. She reports baseline night sweats. She denies any chest pain, shortness of breath, cough, or hemoptysis.  She denies any nausea, vomiting, diarrhea, or constipation.  She denies any rashes or skin changes.  She denies any visual changes or headaches. She recently completed SRS to the new metastatic brain lesions on 12/16/20. She is followed closely by radiation oncology and neuro oncology and recently had a 1 month follow up visit. She is here for evaluation and repeat blood work before starting cycle #41.     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  MEDICATIONS:  Current Outpatient Medications  Medication Sig Dispense Refill   acetaminophen (TYLENOL) 500 MG tablet Take 1,000 mg by mouth every 8 (eight) hours as needed for mild pain, fever or headache.      amLODipine (NORVASC) 10 MG tablet Take 1 tablet (10 mg total) by mouth daily. 90 tablet 3   aspirin EC 81 MG tablet Take 81 mg by mouth daily.     carvedilol (COREG) 25 MG tablet Take 1/2 (one-half) tablet by mouth twice daily 180 tablet 0   Cholecalciferol (VITAMIN D-3) 125 MCG (5000 UT) TABS Take 5,000 Units by mouth daily.      Chromium Picolinate 1000 MCG TABS Take 1,000 mg by mouth daily.     empagliflozin (JARDIANCE) 25 MG TABS tablet Take 1 tablet (25 mg total) by mouth daily before breakfast. 90 tablet 3   esomeprazole (NEXIUM) 20 MG capsule Take 1 capsule by mouth daily.     ezetimibe (ZETIA) 10 MG tablet TAKE 1 TABLET BY MOUTH ONCE DAILY AFTER SUPPER 90 tablet 3   fexofenadine (ALLEGRA) 180 MG tablet 1 tablet swallow whole with water; do not take with fruit juices.     Lancets (ONETOUCH DELICA PLUS [id number]) MISC SMARTSIG:1 Topical 3 Times Daily     lidocaine-prilocaine (EMLA) cream Apply to the Port-A-Cath site 30 to 60 minutes before chemotherapy. 30 g 2   metFORMIN (GLUCOPHAGE) 500 MG tablet Take 500 mg by mouth daily after breakfast.     methocarbamol (ROBAXIN) 500 MG tablet Take 1 tablet (500 mg total) by mouth every 6 (six) hours as needed for muscle spasms. 21 tablet 0   nitroGLYCERIN (NITROSTAT) 0.4 MG SL tablet Place 1 tablet (0.4 mg total) under the tongue every 5 (five) minutes as needed for chest pain. 25 tablet 2   ONETOUCH ULTRA test strip USE 1 STRIP TO CHECK GLUCOSE THREE TIMES DAILY     Pitavastatin Calcium (LIVALO) 4 MG TABS Take 4 mg by mouth at bedtime.      telmisartan (MICARDIS) 40 MG tablet Take 1 tablet by mouth daily.     No current facility-administered medications for this visit.    SURGICAL HISTORY:  Past Surgical History:  Procedure Laterality Date   ABDOMINAL HYSTERECTOMY     bone spur     CARDIAC CATHETERIZATION  06/13/2012   carpel tunnel surgery Left    COLONOSCOPY     9 years ago   CORONARY ANGIOPLASTY  06/13/2012   EYE SURGERY     cyst left eye    IR IMAGING GUIDED PORT INSERTION  10/10/2018   LEFT HEART CATHETERIZATION WITH CORONARY ANGIOGRAM N/A 06/13/2012   Procedure: LEFT HEART CATHETERIZATION WITH CORONARY ANGIOGRAM;  Surgeon: Laverda Page, MD;  Location: [facility] CATH LAB;  Service: Cardiovascular;  Laterality: N/A;   NECK SURGERY     rotator cuff surgery Right 2015    VIDEO BRONCHOSCOPY WITH ENDOBRONCHIAL NAVIGATION N/A 09/06/2018   Procedure: VIDEO BRONCHOSCOPY WITH ENDOBRONCHIAL NAVIGATION, left upper lung;  Surgeon: Lajuana Matte, MD;  Location: [facility];  Service: Thoracic;  Laterality: N/A;   VIDEO BRONCHOSCOPY WITH ENDOBRONCHIAL ULTRASOUND Left 09/06/2018   Procedure: VIDEO BRONCHOSCOPY WITH ENDOBRONCHIAL ULTRASOUND, left lung;  Surgeon: Lajuana Matte, MD;  Location: [facility];  Service: Thoracic;  Laterality: Left;   WISDOM TOOTH EXTRACTION      REVIEW OF SYSTEMS:   Review of Systems  Constitutional: Negative for appetite change, chills, fatigue, fever and unexpected weight change.  HENT: Negative  for mouth sores, nosebleeds, sore throat and trouble swallowing.   Eyes: Negative for eye problems and icterus.  Respiratory: Negative for cough, hemoptysis, shortness of breath and wheezing.   Cardiovascular: Negative for chest pain and leg swelling.  Gastrointestinal: Negative for abdominal pain, constipation, diarrhea, nausea and vomiting.  Genitourinary: Negative for bladder incontinence, difficulty urinating, dysuria, frequency and hematuria.   Musculoskeletal: Negative for back pain, gait problem, neck pain and neck stiffness.  Skin: Negative for itching and rash.  Neurological: Negative for dizziness, extremity weakness, gait problem, headaches, light-headedness and seizures.  Hematological: Negative for adenopathy. Does not bruise/bleed easily.  Psychiatric/Behavioral: Negative for confusion, depression and sleep disturbance. The patient is not nervous/anxious.     PHYSICAL EXAMINATION:  Blood pressure (!) 135/58, pulse 60, temperature 97.8 F (36.6 C), temperature source Temporal, resp. rate 17, height 5' 0.5" (1.537 m), weight 178 lb 14.4 oz (81.1 kg), SpO2 100 %.  ECOG PERFORMANCE STATUS: 1  Physical Exam  Constitutional: Oriented to person, place, and time and well-developed, well-nourished, and in no distress. No distress.   HENT:  Head: Normocephalic and atraumatic.  Mouth/Throat: Oropharynx is clear and moist. No oropharyngeal exudate.  Eyes: Conjunctivae are normal. Right eye exhibits no discharge. Left eye exhibits no discharge. No scleral icterus.  Neck: Normal range of motion. Neck supple.  Cardiovascular: Normal rate, regular rhythm, normal heart sounds and intact distal pulses.   Pulmonary/Chest: Effort normal and breath sounds normal. No respiratory distress. No wheezes. No rales.  Abdominal: Soft. Bowel sounds are normal. Exhibits no distension and no mass. There is no tenderness.  Musculoskeletal: Normal range of motion. Exhibits no edema.  Lymphadenopathy:    No cervical adenopathy.  Neurological: Alert and oriented to person, place, and time. Exhibits normal muscle tone. Gait normal. Coordination normal.  Skin: Skin is warm and dry. No rash noted. Not diaphoretic. No erythema. No pallor.  Psychiatric: Mood, memory and judgment normal.  Vitals reviewed.  LABORATORY DATA: Lab Results  Component Value Date   WBC 7.1 01/21/2021   HGB 11.2 (L) 01/21/2021   HCT 36.7 01/21/2021   MCV 78.4 (L) 01/21/2021   PLT 272 01/21/2021      Chemistry      Component Value Date/Time   NA 141 01/21/2021 0734   NA 139 08/18/2020 0801   K 4.4 01/21/2021 0734   CL 108 01/21/2021 0734   CO2 24 01/21/2021 0734   BUN 21 01/21/2021 0734   BUN 16 08/18/2020 0801   CREATININE 1.82 (H) 01/21/2021 0734      Component Value Date/Time   CALCIUM 8.7 (L) 01/21/2021 0734   ALKPHOS 78 01/21/2021 0734   AST 17 01/21/2021 0734   ALT 11 01/21/2021 0734   BILITOT 0.3 01/21/2021 0734       RADIOGRAPHIC STUDIES:  No results found.   ASSESSMENT/PLAN:  This is a very pleasant 65 year old African-American female with stage IV (T1c, N1, M1c) non-small cell lung cancer, adenocarcinoma. She presented with a dominant left upper lobe lung nodule in addition to a right upper lobe pulmonary nodule with left hilar  lymphadenopathy. She also has metastatic disease to the brain. She has no actionable mutations and her PDL1 expression is 1%. She was diagnosed in July 2020. She also has KRASG12C which is a targetable mutation that can be used in the second line setting.   The patient completed SBRT to the metastatic brain lesion under the care of Dr. Isidore Moos in August 2020. She also had SRS again on  09/02/20 and 12/16/20.    The patient is currently undergoing systemic chemotherapy with carboplatin for an AUC of 5, Alimta 500 mg/m, and Keytruda 200 mg IV every 3 weeks.  She is status post 40 cycles of treatment.  Starting from cycle #5, she has been on maintenance Alimta 500 mg/m2 and Keytruda 200 mg IV. She tolerated her treatment well without any concerning adverse side effects. Alimta was discontinued due to renal insufficiency.   Labs were reviewed. Her creatinine is 1.82. She has baseline CKD and follows closely with nephrology who she is going to see in the near future in January. Recommend that she proceed with cycle #41 today.   We will see her back for a follow up visit in 3 weeks for evaluation and to review her scans before starting cycle #42   The patient was advised to call immediately if she has any concerning symptoms in the interval. The patient voices understanding of current disease status and treatment options and is in agreement with the current care plan. All questions were answered. The patient knows to call the clinic with any problems, questions or concerns. We can certainly see the patient much sooner if necessary   Orders Placed This Encounter  Procedures   CBC with Differential ([facility] Only)    Standing Status:   Standing    Number of Occurrences:   20    Standing Expiration Date:   01/21/2022   CMP ([facility] only)    Standing Status:   Standing    Number of Occurrences:   20    Standing Expiration Date:   01/21/2022      The total time spent in the appointment was  20-29 minutes.   Holleigh Crihfield L Hyland Mollenkopf, PA-C 01/21/21

## 2021-01-21 ENCOUNTER — Other Ambulatory Visit: Payer: Self-pay

## 2021-01-21 ENCOUNTER — Inpatient Hospital Stay: Payer: Medicare Other | Attending: Physician Assistant

## 2021-01-21 ENCOUNTER — Encounter: Payer: Self-pay | Admitting: Physician Assistant

## 2021-01-21 ENCOUNTER — Inpatient Hospital Stay: Payer: Medicare Other

## 2021-01-21 ENCOUNTER — Inpatient Hospital Stay (HOSPITAL_BASED_OUTPATIENT_CLINIC_OR_DEPARTMENT_OTHER): Payer: Medicare Other | Admitting: Physician Assistant

## 2021-01-21 VITALS — BP 135/58 | HR 60 | Temp 97.8°F | Resp 17 | Ht 60.5 in | Wt 178.9 lb

## 2021-01-21 DIAGNOSIS — Z5112 Encounter for antineoplastic immunotherapy: Secondary | ICD-10-CM | POA: Diagnosis not present

## 2021-01-21 DIAGNOSIS — Z79899 Other long term (current) drug therapy: Secondary | ICD-10-CM | POA: Diagnosis not present

## 2021-01-21 DIAGNOSIS — Z95828 Presence of other vascular implants and grafts: Secondary | ICD-10-CM

## 2021-01-21 DIAGNOSIS — C7931 Secondary malignant neoplasm of brain: Secondary | ICD-10-CM | POA: Insufficient documentation

## 2021-01-21 DIAGNOSIS — R5383 Other fatigue: Secondary | ICD-10-CM

## 2021-01-21 DIAGNOSIS — C3492 Malignant neoplasm of unspecified part of left bronchus or lung: Secondary | ICD-10-CM

## 2021-01-21 DIAGNOSIS — C3412 Malignant neoplasm of upper lobe, left bronchus or lung: Secondary | ICD-10-CM | POA: Insufficient documentation

## 2021-01-21 DIAGNOSIS — C3491 Malignant neoplasm of unspecified part of right bronchus or lung: Secondary | ICD-10-CM | POA: Diagnosis not present

## 2021-01-21 DIAGNOSIS — Z23 Encounter for immunization: Secondary | ICD-10-CM | POA: Diagnosis not present

## 2021-01-21 LAB — CMP (CANCER CENTER ONLY)
ALT: 11 U/L (ref 0–44)
AST: 17 U/L (ref 15–41)
Albumin: 3.4 g/dL — ABNORMAL LOW (ref 3.5–5.0)
Alkaline Phosphatase: 78 U/L (ref 38–126)
Anion gap: 9 (ref 5–15)
BUN: 21 mg/dL (ref 8–23)
CO2: 24 mmol/L (ref 22–32)
Calcium: 8.7 mg/dL — ABNORMAL LOW (ref 8.9–10.3)
Chloride: 108 mmol/L (ref 98–111)
Creatinine: 1.82 mg/dL — ABNORMAL HIGH (ref 0.44–1.00)
GFR, Estimated: 30 mL/min — ABNORMAL LOW (ref >60)
Glucose, Bld: 153 mg/dL — ABNORMAL HIGH (ref 70–99)
Potassium: 4.4 mmol/L (ref 3.5–5.1)
Sodium: 141 mmol/L (ref 135–145)
Total Bilirubin: 0.3 mg/dL (ref 0.3–1.2)
Total Protein: 7 g/dL (ref 6.5–8.1)

## 2021-01-21 LAB — CBC WITH DIFFERENTIAL (CANCER CENTER ONLY)
Abs Immature Granulocytes: 0.02 10*3/uL (ref 0.00–0.07)
Basophils Absolute: 0 10*3/uL (ref 0.0–0.1)
Basophils Relative: 1 %
Eosinophils Absolute: 0.4 10*3/uL (ref 0.0–0.5)
Eosinophils Relative: 6 %
HCT: 36.7 % (ref 36.0–46.0)
Hemoglobin: 11.2 g/dL — ABNORMAL LOW (ref 12.0–15.0)
Immature Granulocytes: 0 %
Lymphocytes Relative: 18 %
Lymphs Abs: 1.3 10*3/uL (ref 0.7–4.0)
MCH: 23.9 pg — ABNORMAL LOW (ref 26.0–34.0)
MCHC: 30.5 g/dL (ref 30.0–36.0)
MCV: 78.4 fL — ABNORMAL LOW (ref 80.0–100.0)
Monocytes Absolute: 0.6 10*3/uL (ref 0.1–1.0)
Monocytes Relative: 9 %
Neutro Abs: 4.8 10*3/uL (ref 1.7–7.7)
Neutrophils Relative %: 66 %
Platelet Count: 272 10*3/uL (ref 150–400)
RBC: 4.68 MIL/uL (ref 3.87–5.11)
RDW: 17.5 % — ABNORMAL HIGH (ref 11.5–15.5)
WBC Count: 7.1 10*3/uL (ref 4.0–10.5)
nRBC: 0 % (ref 0.0–0.2)

## 2021-01-21 LAB — TSH: TSH: 2.233 u[IU]/mL (ref 0.308–3.960)

## 2021-01-21 MED ORDER — SODIUM CHLORIDE 0.9 % IV SOLN
200.0000 mg | Freq: Once | INTRAVENOUS | Status: AC
  Administered 2021-01-21: 09:00:00 200 mg via INTRAVENOUS
  Filled 2021-01-21: qty 8

## 2021-01-21 MED ORDER — SODIUM CHLORIDE 0.9% FLUSH
10.0000 mL | INTRAVENOUS | Status: DC | PRN
  Administered 2021-01-21: 10:00:00 10 mL

## 2021-01-21 MED ORDER — SODIUM CHLORIDE 0.9 % IV SOLN
Freq: Once | INTRAVENOUS | Status: AC
Start: 2021-01-21 — End: 2021-01-21

## 2021-01-21 MED ORDER — SODIUM CHLORIDE 0.9% FLUSH
10.0000 mL | INTRAVENOUS | Status: DC | PRN
  Administered 2021-01-21: 08:00:00 10 mL

## 2021-01-21 MED ORDER — HEPARIN SOD (PORK) LOCK FLUSH 100 UNIT/ML IV SOLN
500.0000 [IU] | Freq: Once | INTRAVENOUS | Status: AC | PRN
  Administered 2021-01-21: 10:00:00 500 [IU]

## 2021-01-21 NOTE — Patient Instructions (Signed)
[facility] MEDICAL ONCOLOGY  Discharge Instructions: ?Thank you for choosing [facility] to provide your oncology and hematology care.  ? ?If you have a lab appointment with the Cancer Center, please go directly to the Cancer Center and check in at the registration area. ?  ?Wear comfortable clothing and clothing appropriate for easy access to any Portacath or PICC line.  ? ?We strive to give you quality time with your provider. You may need to reschedule your appointment if you arrive late (15 or more minutes).  Arriving late affects you and other patients whose appointments are after yours.  Also, if you miss three or more appointments without notifying the office, you may be dismissed from the clinic at the provider?s discretion.    ?  ?For prescription refill requests, have your pharmacy contact our office and allow 72 hours for refills to be completed.   ? ?Today you received the following chemotherapy and/or immunotherapy agents: Keytruda ?  ?To help prevent nausea and vomiting after your treatment, we encourage you to take your nausea medication as directed. ? ?BELOW ARE SYMPTOMS THAT SHOULD BE REPORTED IMMEDIATELY: ?*FEVER GREATER THAN 100.4 F (38 ?C) OR HIGHER ?*CHILLS OR SWEATING ?*NAUSEA AND VOMITING THAT IS NOT CONTROLLED WITH YOUR NAUSEA MEDICATION ?*UNUSUAL SHORTNESS OF BREATH ?*UNUSUAL BRUISING OR BLEEDING ?*URINARY PROBLEMS (pain or burning when urinating, or frequent urination) ?*BOWEL PROBLEMS (unusual diarrhea, constipation, pain near the anus) ?TENDERNESS IN MOUTH AND THROAT WITH OR WITHOUT PRESENCE OF ULCERS (sore throat, sores in mouth, or a toothache) ?UNUSUAL RASH, SWELLING OR PAIN  ?UNUSUAL VAGINAL DISCHARGE OR ITCHING  ? ?Items with * indicate a potential emergency and should be followed up as soon as possible or go to the Emergency Department if any problems should occur. ? ?Please show the CHEMOTHERAPY ALERT CARD or IMMUNOTHERAPY ALERT CARD at check-in to the  Emergency Department and triage nurse. ? ?Should you have questions after your visit or need to cancel or reschedule your appointment, please contact [facility]  Dept: [phone]  and follow the prompts.  Office hours are 8:00 a.m. to 4:30 p.m. Monday - Friday. Please note that voicemails left after 4:00 p.m. may not be returned until the following business day.  We are closed weekends and major holidays. You have access to a nurse at all times for urgent questions. Please call the main number to the clinic Dept: [phone] and follow the prompts. ? ? ?For any non-urgent questions, you may also contact your provider using MyChart. We now offer e-Visits for anyone 18 and older to request care online for non-urgent symptoms. For details visit [email or web address]. ?  ?Also download the MyChart app! Go to the app store, search "MyChart", open the app, select [facility], and log in with your MyChart username and password. ? ?Due to Covid, a mask is required upon entering the hospital/clinic. If you do not have a mask, one will be given to you upon arrival. For doctor visits, patients may have 1 support person aged 18 or older with them. For treatment visits, patients cannot have anyone with them due to current Covid guidelines and our immunocompromised population.  ? ?

## 2021-01-21 NOTE — Progress Notes (Addendum)
Ok to treat with creat 1.82 per progress note from [place], [place]

## 2021-01-26 ENCOUNTER — Other Ambulatory Visit: Payer: Self-pay

## 2021-01-28 ENCOUNTER — Other Ambulatory Visit: Payer: Self-pay

## 2021-01-28 ENCOUNTER — Emergency Department (HOSPITAL_COMMUNITY)
Admission: EM | Admit: 2021-01-28 | Discharge: 2021-01-28 | Disposition: A | Discharge status: Home / Self Care | Payer: Medicare Other | Attending: Emergency Medicine | Admitting: Emergency Medicine

## 2021-01-28 ENCOUNTER — Encounter (HOSPITAL_COMMUNITY): Payer: Self-pay

## 2021-01-28 DIAGNOSIS — M79602 Pain in left arm: Secondary | ICD-10-CM

## 2021-01-28 DIAGNOSIS — Z85841 Personal history of malignant neoplasm of brain: Secondary | ICD-10-CM | POA: Diagnosis not present

## 2021-01-28 DIAGNOSIS — Z87891 Personal history of nicotine dependence: Secondary | ICD-10-CM | POA: Insufficient documentation

## 2021-01-28 DIAGNOSIS — E119 Type 2 diabetes mellitus without complications: Secondary | ICD-10-CM | POA: Insufficient documentation

## 2021-01-28 DIAGNOSIS — J45909 Unspecified asthma, uncomplicated: Secondary | ICD-10-CM | POA: Diagnosis not present

## 2021-01-28 DIAGNOSIS — M79622 Pain in left upper arm: Secondary | ICD-10-CM | POA: Diagnosis not present

## 2021-01-28 DIAGNOSIS — Z7984 Long term (current) use of oral hypoglycemic drugs: Secondary | ICD-10-CM | POA: Diagnosis not present

## 2021-01-28 DIAGNOSIS — I10 Essential (primary) hypertension: Secondary | ICD-10-CM | POA: Insufficient documentation

## 2021-01-28 DIAGNOSIS — I2510 Atherosclerotic heart disease of native coronary artery without angina pectoris: Secondary | ICD-10-CM | POA: Insufficient documentation

## 2021-01-28 DIAGNOSIS — Z7982 Long term (current) use of aspirin: Secondary | ICD-10-CM | POA: Insufficient documentation

## 2021-01-28 DIAGNOSIS — Z79899 Other long term (current) drug therapy: Secondary | ICD-10-CM | POA: Diagnosis not present

## 2021-01-28 NOTE — Discharge Instructions (Signed)
Please continue to apply ice/heat as needed to the upper arm.  You can continue to take Tylenol as needed for management of your pain.  Do not exceed 3000 mg of Tylenol per day.  Also consider trying Voltaren gel at the site.  Please follow-up with your regular doctor.  If you develop any new or worsening symptoms please come back to the emergency department.

## 2021-01-28 NOTE — ED Provider Notes (Signed)
[facility] EMERGENCY DEPARTMENT Provider Note   CSN: [id number] Arrival date & time: 01/28/21  2019     History Chief Complaint  Patient presents with   Arm Pain    Carolyn [name] is a 65 y.o. female.  HPI Patient is a 65 year old female with a history of angina, GERD, CAD status post stenting, who presents to the emergency department due to left upper arm pain.  Patient states that she received the COVID-19 vaccination 1 week ago.  Shortly thereafter she began developing waxing and waning pain in the left upper arm around the injection site.  Denies any arm swelling or skin changes.  No numbness or weakness.  No chest pain, shortness of breath, diaphoresis.    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   Hypertension    nscl ca dx'd 08/07/18   Persistent headaches    h/o   Pneumonia    hx of PNA   Shortness of breath    Vaginal dryness    h/o    Patient Active Problem List   Diagnosis Date Noted   Elevated serum creatinine 04/12/2019   Pyelonephritis 02/08/2019   Port-A-Cath in place 10/19/2018   Encounter for antineoplastic chemotherapy 09/28/2018   Encounter for antineoplastic immunotherapy 09/28/2018   Goals of care, counseling/discussion 09/28/2018   Adenocarcinoma, lung ([place]) 09/12/2018   Brain metastases ([place]) 08/30/2018   Mass of upper lobe of left lung 08/17/2018   Angina pectoris ([place]) 06/14/2012   CAD (coronary artery disease), native coronary artery 06/14/2012   S/P PTCA (percutaneous transluminal coronary angioplasty) 06/14/2012   Hyperlipidemia 06/14/2012    Essential hypertension 06/14/2012   Pre-diabetes 06/14/2012    Past Surgical History:  Procedure Laterality Date   ABDOMINAL HYSTERECTOMY     bone spur     CARDIAC CATHETERIZATION  06/13/2012   carpel tunnel surgery Left    COLONOSCOPY     9 years ago   CORONARY ANGIOPLASTY  06/13/2012   EYE SURGERY     cyst left eye    IR IMAGING GUIDED PORT INSERTION  10/10/2018   LEFT HEART CATHETERIZATION WITH CORONARY ANGIOGRAM N/A 06/13/2012   Procedure: LEFT HEART CATHETERIZATION WITH CORONARY ANGIOGRAM;  Surgeon: Laverda Page, MD;  Location: [facility] CATH LAB;  Service: Cardiovascular;  Laterality: N/A;   NECK SURGERY     rotator cuff surgery Right 2015   VIDEO BRONCHOSCOPY WITH ENDOBRONCHIAL NAVIGATION N/A 09/06/2018   Procedure: VIDEO BRONCHOSCOPY WITH ENDOBRONCHIAL NAVIGATION, left upper lung;  Surgeon: Lajuana Matte, MD;  Location: [place];  Service: Thoracic;  Laterality: N/A;   VIDEO BRONCHOSCOPY WITH ENDOBRONCHIAL ULTRASOUND Left 09/06/2018   Procedure: VIDEO BRONCHOSCOPY WITH ENDOBRONCHIAL ULTRASOUND, left lung;  Surgeon: Lajuana Matte, MD;  Location: [place];  Service: Thoracic;  Laterality: Left;   WISDOM TOOTH EXTRACTION       OB History     Gravida  3   Para      Term  Preterm      AB      Living  3      SAB      IAB      Ectopic      Multiple      Live Births              Family History  Problem Relation Age of Onset   Breast cancer Other    Diabetes Mother    Glaucoma Mother    Hypertension Mother    Hypertension Father    Asthma Father    Diabetes Sister    Diabetes Brother    Hypertension Sister     Social History   Tobacco Use   Smoking status: Former    Packs/day: 0.50    Years: 30.00    Pack years: 15.00    Types: Cigarettes    Quit date: 02/09/1999    Years since quitting: 21.9   Smokeless tobacco: Never  Vaping Use   Vaping Use: Never used  Substance Use Topics   Alcohol use: Not Currently   Drug use: Not Currently     Home Medications Prior to Admission medications   Medication Sig Start Date End Date Taking? Authorizing Provider  acetaminophen (TYLENOL) 500 MG tablet Take 1,000 mg by mouth every 8 (eight) hours as needed for mild pain, fever or headache.     [provider]  amLODipine (NORVASC) 10 MG tablet Take 1 tablet (10 mg total) by mouth daily. 03/28/20 03/23/21  Adrian Prows, MD  aspirin EC 81 MG tablet Take 81 mg by mouth daily.    [provider]  carvedilol (COREG) 25 MG tablet Take 1/2 (one-half) tablet by mouth twice daily 12/12/20   Adrian Prows, MD  Cholecalciferol (VITAMIN D-3) 125 MCG (5000 UT) TABS Take 5,000 Units by mouth daily.    [provider]  Chromium Picolinate 1000 MCG TABS Take 1,000 mg by mouth daily.    [provider]  empagliflozin (JARDIANCE) 25 MG TABS tablet Take 1 tablet (25 mg total) by mouth daily before breakfast. 10/16/20   Adrian Prows, MD  esomeprazole (NEXIUM) 20 MG capsule Take 1 capsule by mouth daily.    [provider]  ezetimibe (ZETIA) 10 MG tablet TAKE 1 TABLET BY MOUTH ONCE DAILY AFTER SUPPER 11/13/20   Adrian Prows, MD  fexofenadine (ALLEGRA) 180 MG tablet 1 tablet swallow whole with water; do not take with fruit juices. 02/20/20   [provider]  Lancets (ONETOUCH DELICA PLUS [id number]) [place] SMARTSIG:1 Topical 3 Times Daily 08/01/19   [provider]  lidocaine-prilocaine (EMLA) cream Apply to the Port-A-Cath site 30 to 60 minutes before chemotherapy. 09/27/19   Heilingoetter, Cassandra L, PA-C  metFORMIN (GLUCOPHAGE) 500 MG tablet Take 500 mg by mouth daily after breakfast.    [provider]  methocarbamol (ROBAXIN) 500 MG tablet Take 1 tablet (500 mg total) by mouth every 6 (six) hours as needed for muscle spasms. 03/25/17   Lily Kocher, PA-C  nitroGLYCERIN (NITROSTAT) 0.4 MG SL tablet Place 1 tablet (0.4 mg total) under the tongue every 5 (five) minutes as needed for chest pain. 03/09/19    Adrian Prows, MD  [facility] ULTRA test strip USE 1 STRIP TO [place] DAILY 08/10/18   [provider]  Pitavastatin Calcium (LIVALO) 4 MG TABS Take 4 mg by mouth at bedtime.     [provider]  telmisartan (MICARDIS) 40 MG tablet Take 1 tablet by mouth  daily.    [provider]    Allergies    Atorvastatin, Crestor [rosuvastatin], and Lisinopril  Review of Systems   Review of Systems  Constitutional:  Negative for chills and fever.  Respiratory:  Negative for shortness of breath.   Cardiovascular:  Negative for chest pain.  Gastrointestinal:  Negative for nausea and vomiting.  Musculoskeletal:  Positive for myalgias. Negative for arthralgias.  Skin:  Negative for color change and wound.  Neurological:  Negative for weakness and numbness.   Physical Exam Updated Vital Signs BP 138/66 (BP Location: Right Arm)    Pulse 65    Temp 98.2 F (36.8 C) (Oral)    Resp 18    Ht 5' 0.5" (1.537 m)    Wt 81 kg    SpO2 99%    BMI 34.30 kg/m   Physical Exam Vitals and nursing note reviewed.  Constitutional:      General: She is not in acute distress.    Appearance: Normal appearance. She is not ill-appearing, toxic-appearing or diaphoretic.  HENT:     Head: Normocephalic and atraumatic.     Right Ear: External ear normal.     Left Ear: External ear normal.     Nose: Nose normal.     Mouth/Throat:     Mouth: Mucous membranes are moist.     Pharynx: Oropharynx is clear. No oropharyngeal exudate or posterior oropharyngeal erythema.  Eyes:     General: No scleral icterus.       Right eye: No discharge.        Left eye: No discharge.     Extraocular Movements: Extraocular movements intact.     Conjunctiva/sclera: Conjunctivae normal.  Cardiovascular:     Rate and Rhythm: Normal rate and regular rhythm.     Pulses: Normal pulses.     Heart sounds: Normal heart sounds. No murmur heard.   No friction rub. No gallop.  Pulmonary:     Effort: Pulmonary  effort is normal. No respiratory distress.     Breath sounds: Normal breath sounds. No stridor. No wheezing, rhonchi or rales.  Abdominal:     General: Abdomen is flat.     Palpations: Abdomen is soft.     Tenderness: There is no abdominal tenderness.  Musculoskeletal:        General: Tenderness present. Normal range of motion.     Cervical back: Normal range of motion and neck supple. No tenderness.     Comments: Mild tenderness noted along the left deltoid.  No overlying skin changes or soft tissue swelling.  No bony tenderness.  Full range of motion of the left shoulder, elbow, and wrist.  Distal sensation intact.  2+ radial pulses.  Skin:    General: Skin is warm and dry.  Neurological:     General: No focal deficit present.     Mental Status: She is alert and oriented to person, place, and time.  Psychiatric:        Mood and Affect: Mood normal.        Behavior: Behavior normal.    ED Results / Procedures / Treatments   Labs (all labs ordered are listed, but only abnormal results are displayed) Labs Reviewed - No data to display  EKG EKG Interpretation  Date/Time:  Wednesday January 28 2021 22:03:23 EST Ventricular Rate:  55 PR Interval:  156 QRS Duration: 76 QT Interval:  420 QTC Calculation: 402 R Axis:   60 Text Interpretation: Sinus rhythm Low voltage, precordial  leads No significant change since last tracing Confirmed by Dorie Rank [phone]) on 01/28/2021 10:32:47 PM  Radiology No results found.  Procedures Procedures   Medications Ordered in ED Medications - No data to display  ED Course  I have reviewed the triage vital signs and the nursing notes.  Pertinent labs & imaging results that were available during my care of the patient were reviewed by me and considered in my medical decision making (see chart for details).    MDM Rules/Calculators/A&P                          Patient is a 65 year old female who presents to the emergency department due to  left upper arm pain that began about 1 week ago after receiving a COVID-19 injection.  On my exam patient has mild tenderness along the left deltoid.  No overlying skin changes or soft tissue swelling.  Full range of motion of the left shoulder, elbow, and wrist.  Neurovascularly intact distal to the site.  No chest pain or shortness of breath.  No numbness or weakness.  Given patient's cardiac history I obtained an ECG which shows sinus rhythm with low voltage in the precordial leads.  No significant change since last tracing.  Patient afebrile, not tachycardic, as well as not hypoxic.  Doubt ACS at this time.  No edema or skin changes in the arm concerning for DVT or infectious etiology.  Feel the patient is stable for discharge at this time and she is agreeable.  Discussed management with continued use of Tylenol as well as the application of Voltaren gel.  Also discussed application of ice/heat.  Discussed return precautions.  Her questions were answered and she was amicable at the time of discharge.  Final Clinical Impression(s) / ED Diagnoses Final diagnoses:  Left arm pain   Rx / DC Orders ED Discharge Orders     None        Rayna Sexton, PA-C 01/29/21 1406    Dorie Rank, MD 02/01/21 [phone]

## 2021-01-28 NOTE — ED Triage Notes (Signed)
Pt arrived via POV from home c/o left arm pain that began a few days ago. Pt does not recall any injury but does endorse receiving 3rd covid shot in left shoulder where she reports pain originates from. Pt has full range in upper left extremity.

## 2021-01-29 ENCOUNTER — Encounter: Payer: Self-pay | Admitting: Internal Medicine

## 2021-01-29 NOTE — Progress Notes (Signed)
° °                                                                                                                                                          °  Patient Name: Carolyn [name] MRN: [id number] DOB: 1955-06-16 Referring Physician: Curt Bears (Profile Not Attached) Date of Service: 12/16/2020 [facility], [place]                                                        End Of Treatment Note  Diagnoses: C79.31-Secondary malignant neoplasm of brain  Cancer Staging:  Cancer Staging  Adenocarcinoma, lung ([place]) Staging form: Lung, AJCC 8th Edition - Clinical: Stage IVB (cT1c, cN1, cM1c) - Signed by Curt Bears, MD on 05/14/2020   Intent: Palliative  Radiation Treatment Dates: 12/16/2020 through 12/16/2020 Site Technique Total Dose (Gy) Dose per Fx (Gy) Completed Fx Beam Energies  Brain: Brain_SRS 3D 20/20 20 1/1 [id number]   Narrative: The patient tolerated radiation therapy relatively well.   Plan: The patient will follow-up with radiation oncology in 60mo . -----------------------------------  Eppie Gibson, MD

## 2021-02-10 DIAGNOSIS — M79622 Pain in left upper arm: Secondary | ICD-10-CM | POA: Diagnosis not present

## 2021-02-10 DIAGNOSIS — I10 Essential (primary) hypertension: Secondary | ICD-10-CM | POA: Diagnosis not present

## 2021-02-12 ENCOUNTER — Other Ambulatory Visit: Payer: Self-pay

## 2021-02-12 ENCOUNTER — Inpatient Hospital Stay: Payer: Medicare Other | Attending: Physician Assistant

## 2021-02-12 ENCOUNTER — Ambulatory Visit: Payer: Medicare Other | Admitting: Internal Medicine

## 2021-02-12 ENCOUNTER — Inpatient Hospital Stay (HOSPITAL_BASED_OUTPATIENT_CLINIC_OR_DEPARTMENT_OTHER): Payer: Medicare Other | Admitting: Internal Medicine

## 2021-02-12 ENCOUNTER — Ambulatory Visit: Payer: Medicare Other

## 2021-02-12 ENCOUNTER — Other Ambulatory Visit: Payer: Medicare Other

## 2021-02-12 ENCOUNTER — Inpatient Hospital Stay: Payer: Medicare Other

## 2021-02-12 VITALS — BP 132/62 | HR 63 | Temp 97.1°F | Resp 20 | Ht 60.5 in | Wt 180.3 lb

## 2021-02-12 DIAGNOSIS — Z79899 Other long term (current) drug therapy: Secondary | ICD-10-CM | POA: Diagnosis not present

## 2021-02-12 DIAGNOSIS — Z95828 Presence of other vascular implants and grafts: Secondary | ICD-10-CM

## 2021-02-12 DIAGNOSIS — C3492 Malignant neoplasm of unspecified part of left bronchus or lung: Secondary | ICD-10-CM

## 2021-02-12 DIAGNOSIS — C3491 Malignant neoplasm of unspecified part of right bronchus or lung: Secondary | ICD-10-CM

## 2021-02-12 DIAGNOSIS — C7931 Secondary malignant neoplasm of brain: Secondary | ICD-10-CM | POA: Diagnosis not present

## 2021-02-12 DIAGNOSIS — C3412 Malignant neoplasm of upper lobe, left bronchus or lung: Secondary | ICD-10-CM | POA: Diagnosis not present

## 2021-02-12 DIAGNOSIS — Z5112 Encounter for antineoplastic immunotherapy: Secondary | ICD-10-CM | POA: Diagnosis not present

## 2021-02-12 DIAGNOSIS — C3490 Malignant neoplasm of unspecified part of unspecified bronchus or lung: Secondary | ICD-10-CM

## 2021-02-12 DIAGNOSIS — R5383 Other fatigue: Secondary | ICD-10-CM

## 2021-02-12 LAB — CBC WITH DIFFERENTIAL (CANCER CENTER ONLY)
Abs Immature Granulocytes: 0.01 10*3/uL (ref 0.00–0.07)
Basophils Absolute: 0.1 10*3/uL (ref 0.0–0.1)
Basophils Relative: 1 %
Eosinophils Absolute: 0.3 10*3/uL (ref 0.0–0.5)
Eosinophils Relative: 5 %
HCT: 38.6 % (ref 36.0–46.0)
Hemoglobin: 11.4 g/dL — ABNORMAL LOW (ref 12.0–15.0)
Immature Granulocytes: 0 %
Lymphocytes Relative: 20 %
Lymphs Abs: 1.4 10*3/uL (ref 0.7–4.0)
MCH: 23.4 pg — ABNORMAL LOW (ref 26.0–34.0)
MCHC: 29.5 g/dL — ABNORMAL LOW (ref 30.0–36.0)
MCV: 79.1 fL — ABNORMAL LOW (ref 80.0–100.0)
Monocytes Absolute: 0.6 10*3/uL (ref 0.1–1.0)
Monocytes Relative: 8 %
Neutro Abs: 4.7 10*3/uL (ref 1.7–7.7)
Neutrophils Relative %: 66 %
Platelet Count: 259 10*3/uL (ref 150–400)
RBC: 4.88 MIL/uL (ref 3.87–5.11)
RDW: 17.7 % — ABNORMAL HIGH (ref 11.5–15.5)
WBC Count: 7.1 10*3/uL (ref 4.0–10.5)
nRBC: 0 % (ref 0.0–0.2)

## 2021-02-12 LAB — CMP (CANCER CENTER ONLY)
ALT: 12 U/L (ref 0–44)
AST: 18 U/L (ref 15–41)
Albumin: 3.8 g/dL (ref 3.5–5.0)
Alkaline Phosphatase: 73 U/L (ref 38–126)
Anion gap: 8 (ref 5–15)
BUN: 22 mg/dL (ref 8–23)
CO2: 28 mmol/L (ref 22–32)
Calcium: 9.1 mg/dL (ref 8.9–10.3)
Chloride: 105 mmol/L (ref 98–111)
Creatinine: 1.69 mg/dL — ABNORMAL HIGH (ref 0.44–1.00)
GFR, Estimated: 33 mL/min — ABNORMAL LOW (ref >60)
Glucose, Bld: 161 mg/dL — ABNORMAL HIGH (ref 70–99)
Potassium: 4.3 mmol/L (ref 3.5–5.1)
Sodium: 141 mmol/L (ref 135–145)
Total Bilirubin: 0.4 mg/dL (ref 0.3–1.2)
Total Protein: 7.1 g/dL (ref 6.5–8.1)

## 2021-02-12 LAB — TSH: TSH: 2.6 u[IU]/mL (ref 0.308–3.960)

## 2021-02-12 MED ORDER — SODIUM CHLORIDE 0.9 % IV SOLN
200.0000 mg | Freq: Once | INTRAVENOUS | Status: AC
  Administered 2021-02-12: 200 mg via INTRAVENOUS
  Filled 2021-02-12: qty 8

## 2021-02-12 MED ORDER — SODIUM CHLORIDE 0.9% FLUSH
10.0000 mL | INTRAVENOUS | Status: DC | PRN
  Administered 2021-02-12: 10 mL

## 2021-02-12 MED ORDER — HEPARIN SOD (PORK) LOCK FLUSH 100 UNIT/ML IV SOLN
500.0000 [IU] | Freq: Once | INTRAVENOUS | Status: AC | PRN
  Administered 2021-02-12: 500 [IU]

## 2021-02-12 MED ORDER — SODIUM CHLORIDE 0.9 % IV SOLN: Freq: Once | INTRAVENOUS | Status: AC

## 2021-02-12 MED ORDER — SODIUM CHLORIDE 0.9% FLUSH: 10.0000 mL | INTRAVENOUS | Status: DC | PRN

## 2021-02-12 NOTE — Progress Notes (Signed)
[place] [phone]   [phone]  OFFICE PROGRESS NOTE  [name], [place] [address]  DIAGNOSIS: Stage IV (T1c, N1, M1c ) non-small cell lung cancer, adenocarcinoma diagnosed in July 2020 and presented with left upper lobe lung nodule in addition to right upper lobe lung nodule with left hilar lymphadenopathy as well as multiple brain metastasis.  Biomarker Findings Microsatellite status - MS-Stable Tumor Mutational Burden - 4 Muts/Mb Genomic Findings For a complete list of the genes assayed, please refer to the Appendix. ATM G204* KRAS G12C PDGFRA P122T SMO R199W 7 Disease relevant genes with no reportable alterations: ALK, BRAF, EGFR, ERBB2, MET, RET, ROS1  PDL 1 expression was 1%  PRIOR THERAPY:  1) SRS to multiple brain metastasis under the care of Dr. Isidore Moos. 2) SRS to a 4 mm new brain metastasis under the care of Dr. Isidore Moos and Dr. Saintclair Halsted on 09/02/2020  CURRENT THERAPY: Systemic chemotherapy with carboplatin for AUC of 5, Alimta 500 mg/M2 and Keytruda 200 mg IV every 3 weeks.  First dose October 05, 2018.  Status post 41 cycles.  Starting from cycle #5 the patient will be on treatment with maintenance Alimta 500 mg/M2 and Keytruda 200 mg IV every 3 weeks.  She has been on treatment with single agent Keytruda recently secondary to renal insufficiency.  INTERVAL HISTORY: Carolyn [name] 66 y.o. female returns to the clinic today for follow-up visit.  The patient is feeling fine today with no concerning complaints except for pain on the left arm after she received her COVID booster shot few weeks ago.  She denied having any current chest pain, shortness of breath, cough or hemoptysis.  She denied having any fever or chills.  She has no nausea, vomiting, diarrhea or constipation.  She has no headache or visual changes.  She has no significant weight loss or night sweats.  She has intermittent  leg cramps.  The patient is here today for evaluation before starting cycle #42 of her treatment.  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  MEDICATIONS:  Current Outpatient Medications  Medication Sig Dispense Refill   acetaminophen (TYLENOL) 500 MG tablet Take 1,000 mg by mouth every 8 (eight) hours as needed for mild pain, fever or headache.      amLODipine (NORVASC) 10 MG tablet Take 1 tablet (10 mg total) by mouth daily. 90 tablet 3   aspirin EC 81 MG tablet Take 81 mg by mouth daily.     carvedilol (COREG) 25 MG tablet Take 1/2 (one-half) tablet by mouth twice daily 180 tablet  0   Cholecalciferol (VITAMIN D-3) 125 MCG (5000 UT) TABS Take 5,000 Units by mouth daily.     Chromium Picolinate 1000 MCG TABS Take 1,000 mg by mouth daily.     empagliflozin (JARDIANCE) 25 MG TABS tablet Take 1 tablet (25 mg total) by mouth daily before breakfast. 90 tablet 3   esomeprazole (NEXIUM) 20 MG capsule Take 1 capsule by mouth daily.     ezetimibe (ZETIA) 10 MG tablet TAKE 1 TABLET BY MOUTH ONCE DAILY AFTER SUPPER 90 tablet 3   fexofenadine (ALLEGRA) 180 MG tablet 1 tablet swallow whole with water; do not take with fruit juices.     Lancets (ONETOUCH DELICA PLUS [id number]) [place] SMARTSIG:1 Topical 3 Times Daily     lidocaine-prilocaine (EMLA) cream Apply to the  Port-A-Cath site 30 to 60 minutes before chemotherapy. 30 g 2   metFORMIN (GLUCOPHAGE) 500 MG tablet Take 500 mg by mouth daily after breakfast.     methocarbamol (ROBAXIN) 500 MG tablet Take 1 tablet (500 mg total) by mouth every 6 (six) hours as needed for muscle spasms. 21 tablet 0   nitroGLYCERIN (NITROSTAT) 0.4 MG SL tablet Place 1 tablet (0.4 mg total) under the tongue every 5 (five) minutes as needed for chest pain. 25 tablet 2   ONETOUCH ULTRA test strip USE 1 STRIP TO CHECK GLUCOSE THREE TIMES DAILY     Pitavastatin Calcium (LIVALO) 4 MG TABS Take 4 mg by mouth at bedtime.      telmisartan (MICARDIS) 40 MG tablet Take 1 tablet by mouth daily.     No current facility-administered medications for this visit.    SURGICAL HISTORY:  Past Surgical History:  Procedure Laterality Date   ABDOMINAL HYSTERECTOMY     bone spur     CARDIAC CATHETERIZATION  06/13/2012   carpel tunnel surgery Left    COLONOSCOPY     9 years ago   CORONARY ANGIOPLASTY  06/13/2012   EYE SURGERY     cyst left eye    IR IMAGING GUIDED PORT INSERTION  10/10/2018   LEFT HEART CATHETERIZATION WITH CORONARY ANGIOGRAM N/A 06/13/2012   Procedure: LEFT HEART CATHETERIZATION WITH CORONARY ANGIOGRAM;  Surgeon: Laverda Page, MD;  Location: [facility] CATH LAB;  Service: Cardiovascular;  Laterality: N/A;   NECK SURGERY     rotator cuff surgery Right 2015   VIDEO BRONCHOSCOPY WITH ENDOBRONCHIAL NAVIGATION N/A 09/06/2018   Procedure: VIDEO BRONCHOSCOPY WITH ENDOBRONCHIAL NAVIGATION, left upper lung;  Surgeon: Lajuana Matte, MD;  Location: [place];  Service: Thoracic;  Laterality: N/A;   VIDEO BRONCHOSCOPY WITH ENDOBRONCHIAL ULTRASOUND Left 09/06/2018   Procedure: VIDEO BRONCHOSCOPY WITH ENDOBRONCHIAL ULTRASOUND, left lung;  Surgeon: Lajuana Matte, MD;  Location: [place] [facility];  Service: Thoracic;  Laterality: Left;   WISDOM TOOTH EXTRACTION      REVIEW OF SYSTEMS:  A comprehensive review of systems was negative except for:  Constitutional: positive for fatigue Musculoskeletal: positive for arthralgias   PHYSICAL EXAMINATION: General appearance: alert, cooperative, fatigued, and no distress Head: Normocephalic, without obvious abnormality, atraumatic Neck: no adenopathy, no JVD, supple, symmetrical, trachea midline, and thyroid not enlarged, symmetric, no tenderness/mass/nodules Lymph nodes: Cervical, supraclavicular, and axillary nodes normal. Resp: clear to auscultation bilaterally Back: symmetric, no curvature. ROM normal. No CVA tenderness. Cardio: regular rate and rhythm, S1, S2 normal, no murmur, click, rub or gallop GI: soft, non-tender; bowel sounds normal; no masses,  no organomegaly Extremities: extremities normal, atraumatic, no cyanosis or edema  ECOG PERFORMANCE STATUS: 1 -  Symptomatic but completely ambulatory  Blood pressure 132/62, pulse 63, temperature (!) 97.1 F (36.2 C), temperature source Tympanic, resp. rate 20, height 5' 0.5" (1.537 m), weight 180 lb 4.8 oz (81.8 kg), SpO2 100 %.  LABORATORY DATA: Lab Results  Component Value Date   WBC 7.1 01/21/2021   HGB 11.2 (L) 01/21/2021   HCT 36.7 01/21/2021   MCV 78.4 (L) 01/21/2021   PLT 272 01/21/2021      Chemistry      Component Value Date/Time   NA 141 01/21/2021 0734   NA 139 08/18/2020 0801   K 4.4 01/21/2021 0734   CL 108 01/21/2021 0734   CO2 24 01/21/2021 0734   BUN 21 01/21/2021 0734   BUN 16 08/18/2020 0801   CREATININE 1.82 (H) 01/21/2021 0734      Component Value Date/Time   CALCIUM 8.7 (L) 01/21/2021 0734   ALKPHOS 78 01/21/2021 0734   AST 17 01/21/2021 0734   ALT 11 01/21/2021 0734   BILITOT 0.3 01/21/2021 0734       RADIOGRAPHIC STUDIES: No results found.   ASSESSMENT AND PLAN: This is a very pleasant 66 years old African-American female with likely stage IV (T1c, N1, M1C) non-small cell lung cancer, adenocarcinoma with K-ras G12C mutation diagnosed in July 2020 and presented with left upper lobe lung  nodule in addition to right upper lobe pulmonary nodule as well as left hilar adenopathy and metastatic lesion to the brain. Molecular studies showed no actionable mutation and PDL 1 expression was 1%. The patient underwent SBRT to the metastatic brain lesion under the care of Dr. Isidore Moos. The patient is currently undergoing systemic chemotherapy with carboplatin for AUC of 5, Alimta 500 mg/M2 and Keytruda 200 mg IV every 3 weeks is status post 41 cycles.  Starting from cycle #5 she is on maintenance treatment with Alimta and Keytruda every 3 weeks with only Keytruda on the last few cycles because of the renal insufficiency. The patient has been tolerating this treatment well with no concerning adverse effects. I recommended for the patient to proceed with cycle #42 today as planned. I will see her back for follow-up visit in 3 weeks for evaluation with repeat CT scan of the chest, abdomen and pelvis without contrast for restaging of her disease. For the chronic renal insufficiency, she is followed by nephrology. The patient was advised to call immediately if she has any other concerning symptoms in the interval.  The patient voices understanding of current disease status and treatment options and is in agreement with the current care plan. All questions were answered. The patient knows to call the clinic with any problems, questions or concerns. We can certainly see the patient much sooner if necessary.  Disclaimer: This note was dictated with voice recognition software. Similar sounding words can inadvertently be transcribed and may not be corrected upon review.

## 2021-02-12 NOTE — Progress Notes (Signed)
Ok to treat d1/c42 Nat Math w/ creatinine 1.69 per Dr. Julien Nordmann

## 2021-02-12 NOTE — Patient Instructions (Signed)
[facility]  Discharge Instructions: ?Thank you for choosing [facility] to provide your oncology and hematology care.  ? ?If you have a lab appointment with the Cancer Center, please go directly to the Cancer Center and check in at the registration area. ?  ?Wear comfortable clothing and clothing appropriate for easy access to any Portacath or PICC line.  ? ?We strive to give you quality time with your provider. You may need to reschedule your appointment if you arrive late (15 or more minutes).  Arriving late affects you and other patients whose appointments are after yours.  Also, if you miss three or more appointments without notifying the office, you may be dismissed from the clinic at the provider?s discretion.    ?  ?For prescription refill requests, have your pharmacy contact our office and allow 72 hours for refills to be completed.   ? ?Today you received the following chemotherapy and/or immunotherapy agents: Keytruda ?  ?To help prevent nausea and vomiting after your treatment, we encourage you to take your nausea medication as directed. ? ?BELOW ARE SYMPTOMS THAT SHOULD BE REPORTED IMMEDIATELY: ?*FEVER GREATER THAN 100.4 F (38 ?C) OR HIGHER ?*CHILLS OR SWEATING ?*NAUSEA AND VOMITING THAT IS NOT CONTROLLED WITH YOUR NAUSEA MEDICATION ?*UNUSUAL SHORTNESS OF BREATH ?*UNUSUAL BRUISING OR BLEEDING ?*URINARY PROBLEMS (pain or burning when urinating, or frequent urination) ?*BOWEL PROBLEMS (unusual diarrhea, constipation, pain near the anus) ?TENDERNESS IN MOUTH AND THROAT WITH OR WITHOUT PRESENCE OF ULCERS (sore throat, sores in mouth, or a toothache) ?UNUSUAL RASH, SWELLING OR PAIN  ?UNUSUAL VAGINAL DISCHARGE OR ITCHING  ? ?Items with * indicate a potential emergency and should be followed up as soon as possible or go to the Emergency Department if any problems should occur. ? ?Please show the CHEMOTHERAPY ALERT CARD or IMMUNOTHERAPY ALERT CARD at check-in to the  Emergency Department and triage nurse. ? ?Should you have questions after your visit or need to cancel or reschedule your appointment, please contact [facility]  Dept: [phone]  and follow the prompts.  Office hours are 8:00 a.m. to 4:30 p.m. Monday - Friday. Please note that voicemails left after 4:00 p.m. may not be returned until the following business day.  We are closed weekends and major holidays. You have access to a nurse at all times for urgent questions. Please call the main number to the clinic Dept: [phone] and follow the prompts. ? ? ?For any non-urgent questions, you may also contact your provider using MyChart. We now offer e-Visits for anyone 18 and older to request care online for non-urgent symptoms. For details visit [email or web address]. ?  ?Also download the MyChart app! Go to the app store, search "MyChart", open the app, select [facility], and log in with your MyChart username and password. ? ?Due to Covid, a mask is required upon entering the hospital/clinic. If you do not have a mask, one will be given to you upon arrival. For doctor visits, patients may have 1 support person aged 18 or older with them. For treatment visits, patients cannot have anyone with them due to current Covid guidelines and our immunocompromised population.  ? ?

## 2021-02-13 ENCOUNTER — Other Ambulatory Visit: Payer: Self-pay | Admitting: Radiation Therapy

## 2021-02-13 DIAGNOSIS — C7931 Secondary malignant neoplasm of brain: Secondary | ICD-10-CM

## 2021-02-13 NOTE — Progress Notes (Addendum)
Orders placed for port access the day of brain MRI at [facility].     Spoke with pt 02/23/21 about her upcoming appointments. She said that she prefer GI not access her port the day of her brain MRI, but use an IV instead.     [name] R.T.(R)(T) Radiation Special Procedures Navigator

## 2021-03-02 ENCOUNTER — Other Ambulatory Visit: Payer: Self-pay

## 2021-03-02 ENCOUNTER — Ambulatory Visit (INDEPENDENT_AMBULATORY_CARE_PROVIDER_SITE_OTHER): Payer: Medicare Other | Admitting: Orthopedic Surgery

## 2021-03-02 ENCOUNTER — Encounter: Payer: Self-pay | Admitting: Orthopedic Surgery

## 2021-03-02 ENCOUNTER — Ambulatory Visit (INDEPENDENT_AMBULATORY_CARE_PROVIDER_SITE_OTHER): Payer: Medicare Other

## 2021-03-02 DIAGNOSIS — M25512 Pain in left shoulder: Secondary | ICD-10-CM | POA: Diagnosis not present

## 2021-03-02 NOTE — Progress Notes (Signed)
Office Visit Note   Patient: Carolyn [name]           Date of Birth: 1955-03-29           MRN: [id number] Visit Date: 03/02/2021 Requested by: [name], [address] [place],  [place] [address] PCP: [name], FNP  Subjective: Chief Complaint  Patient presents with   Left Shoulder - Pain    HPI: Carolyn [name] is a 66 year old patient with left shoulder pain.  Radiates down to the elbow.  Denies any numbness and tingling.  Pain does not wake her from sleep.  Relatively acute onset 6 weeks ago 1 week after her COVID injection in December.  Has improved but she does report some catching and tightness around that left shoulder region.  She had to go to the emergency department secondary to the pain.  EKG okay at that time by report.  She does have a history of neck fusion in 2018.  Does not really remember much in the way of which radicular symptoms necessitated that intervention.  Some movements do hurt her.              ROS: All systems reviewed are negative as they relate to the chief complaint within the history of present illness.  Patient denies  fevers or chills.   Assessment & Plan: Visit Diagnoses:  1. Left shoulder pain, unspecified chronicity     Plan: Impression is left shoulder pain in a patient with metastatic lung cancer with brain metastasis.  She has a lot going on in terms of treatment.  Radiographs are unremarkable in terms of structural bony pathology.  This could be bursitis.  Plan MRI arthrogram left shoulder to evaluate rotator cuff tear in the setting of metastatic lung cancer.  May consider injection at that time.  No real arthritis on plain radiographs today.  Follow-Up Instructions: Return for after MRI.   Orders:  Orders Placed This Encounter  Procedures   XR Shoulder Left   MR Shoulder Left w/ contrast   Arthrogram   No orders of the defined types were placed in this encounter.     Procedures: No procedures  performed   Clinical Data: No additional findings.  Objective: Vital Signs: There were no vitals taken for this visit.  Physical Exam:   Constitutional: Patient appears well-developed HEENT:  Head: Normocephalic Eyes:EOM are normal Neck: Normal range of motion Cardiovascular: Normal rate Pulmonary/chest: Effort normal Neurologic: Patient is alert Skin: Skin is warm Psychiatric: Patient has normal mood and affect   Ortho Exam: Ortho exam demonstrates full active and passive range of motion of the cervical spine.  No lymphadenopathy in that left shoulder girdle region.  She has passive range of motion of 70/95/170 on the left-hand side.  No discrete AC joint tenderness.  No unusual coarse grinding or crepitus with internal ex rotation of that left arm.  O'Brien's testing equivocal on the left negative on the right.  No masses lymphadenopathy or skin changes noted in that shoulder girdle region.  Specialty Comments:  No specialty comments available.  Imaging: XR Shoulder Left  Result Date: 03/02/2021 AP axillary outlet radiographs left shoulder reviewed.  No acute fracture.  No significant glenohumeral joint arthritis or AC joint arthritis.  Acromiohumeral distance maintained.  No structural bony abnormalities.  Visualized lung fields without obvious parenchymal lesions.    PMFS History: Patient Active Problem List   Diagnosis Date Noted   Elevated serum creatinine 04/12/2019  Pyelonephritis 02/08/2019   Port-A-Cath in place 10/19/2018   Encounter for antineoplastic chemotherapy 09/28/2018   Encounter for antineoplastic immunotherapy 09/28/2018   Goals of care, counseling/discussion 09/28/2018   Adenocarcinoma, lung ([facility]) 09/12/2018   Brain metastases ([facility]) 08/30/2018   Mass of upper lobe of left lung 08/17/2018   Angina pectoris ([facility]) 06/14/2012   CAD (coronary artery disease), native coronary artery 06/14/2012   S/P PTCA (percutaneous transluminal coronary angioplasty)  06/14/2012   Hyperlipidemia 06/14/2012   Essential hypertension 06/14/2012   Pre-diabetes 06/14/2012   Past Medical History:  Diagnosis Date   Anginal pain ([facility])    " with exertion "   Arthritis    " 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 "   Asthma    h/o   Brain metastases ([facility]) 08/2018   Coronary artery disease    Diabetes mellitus without complication (HCC)    Type II   GERD (gastroesophageal reflux disease)    H/O hiatal hernia    Heart murmur    History of radiation therapy 09/22/2018   stereotactic radiosurgery    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   Hypertension    nscl ca dx'd 08/07/18   Persistent headaches    h/o   Pneumonia    hx of PNA   Shortness of breath    Vaginal dryness    h/o    Family History  Problem Relation Age of Onset   Breast cancer Other    Diabetes Mother    Glaucoma Mother    Hypertension Mother    Hypertension Father    Asthma Father    Diabetes Sister    Diabetes Brother    Hypertension Sister     Past Surgical History:  Procedure Laterality Date   ABDOMINAL HYSTERECTOMY     bone spur     CARDIAC CATHETERIZATION  06/13/2012   carpel tunnel surgery Left    COLONOSCOPY     9 years ago   CORONARY ANGIOPLASTY  06/13/2012   EYE SURGERY     cyst left eye    IR IMAGING GUIDED PORT INSERTION  10/10/2018   LEFT HEART CATHETERIZATION WITH CORONARY ANGIOGRAM N/A 06/13/2012   Procedure: LEFT HEART CATHETERIZATION WITH CORONARY ANGIOGRAM;  Surgeon: Laverda Page, MD;  Location: [facility] CATH LAB;  Service: Cardiovascular;  Laterality: N/A;   NECK SURGERY     rotator cuff surgery Right 2015   VIDEO BRONCHOSCOPY WITH ENDOBRONCHIAL NAVIGATION N/A 09/06/2018   Procedure: VIDEO BRONCHOSCOPY WITH ENDOBRONCHIAL NAVIGATION, left upper lung;  Surgeon: Lajuana Matte, MD;  Location: [facility] OR;  Service: Thoracic;  Laterality: N/A;   VIDEO BRONCHOSCOPY WITH ENDOBRONCHIAL ULTRASOUND Left 09/06/2018   Procedure: VIDEO BRONCHOSCOPY WITH ENDOBRONCHIAL ULTRASOUND, left lung;  Surgeon: Lajuana Matte, MD;  Location: [facility] OR;  Service: Thoracic;  Laterality: Left;   WISDOM TOOTH EXTRACTION     Social History   Occupational History   Not on file  Tobacco Use   Smoking status: Former    Packs/day: 0.50    Years: 30.00    Pack years: 15.00    Types: Cigarettes    Quit date: 02/09/1999    Years since quitting: 22.0   Smokeless tobacco: Never  Vaping Use   Vaping Use: Never used  Substance and Sexual Activity   Alcohol use: Not Currently   Drug use: Not Currently   Sexual activity: Yes    Birth control/protection: Post-menopausal

## 2021-03-03 ENCOUNTER — Ambulatory Visit (HOSPITAL_COMMUNITY)
Admission: RE | Admit: 2021-03-03 | Discharge: 2021-03-03 | Disposition: A | Discharge status: Home / Self Care | Payer: Medicare Other | Source: Ambulatory Visit | Attending: Internal Medicine | Admitting: Internal Medicine

## 2021-03-03 ENCOUNTER — Encounter (HOSPITAL_COMMUNITY): Payer: Self-pay

## 2021-03-03 DIAGNOSIS — K5710 Diverticulosis of small intestine without perforation or abscess without bleeding: Secondary | ICD-10-CM | POA: Diagnosis not present

## 2021-03-03 DIAGNOSIS — C3490 Malignant neoplasm of unspecified part of unspecified bronchus or lung: Secondary | ICD-10-CM | POA: Insufficient documentation

## 2021-03-03 DIAGNOSIS — R911 Solitary pulmonary nodule: Secondary | ICD-10-CM | POA: Diagnosis not present

## 2021-03-03 DIAGNOSIS — I3139 Other pericardial effusion (noninflammatory): Secondary | ICD-10-CM | POA: Diagnosis not present

## 2021-03-04 ENCOUNTER — Inpatient Hospital Stay: Payer: Medicare Other

## 2021-03-04 ENCOUNTER — Encounter: Payer: Self-pay | Admitting: Internal Medicine

## 2021-03-04 ENCOUNTER — Other Ambulatory Visit: Payer: Self-pay

## 2021-03-04 ENCOUNTER — Inpatient Hospital Stay (HOSPITAL_BASED_OUTPATIENT_CLINIC_OR_DEPARTMENT_OTHER): Payer: Medicare Other | Admitting: Internal Medicine

## 2021-03-04 VITALS — BP 132/72 | HR 58 | Temp 97.3°F | Resp 19 | Ht 60.5 in | Wt 178.3 lb

## 2021-03-04 DIAGNOSIS — C3491 Malignant neoplasm of unspecified part of right bronchus or lung: Secondary | ICD-10-CM | POA: Diagnosis not present

## 2021-03-04 DIAGNOSIS — C3492 Malignant neoplasm of unspecified part of left bronchus or lung: Secondary | ICD-10-CM

## 2021-03-04 DIAGNOSIS — C3412 Malignant neoplasm of upper lobe, left bronchus or lung: Secondary | ICD-10-CM | POA: Diagnosis not present

## 2021-03-04 DIAGNOSIS — C7931 Secondary malignant neoplasm of brain: Secondary | ICD-10-CM

## 2021-03-04 DIAGNOSIS — Z5112 Encounter for antineoplastic immunotherapy: Secondary | ICD-10-CM

## 2021-03-04 DIAGNOSIS — Z79899 Other long term (current) drug therapy: Secondary | ICD-10-CM | POA: Diagnosis not present

## 2021-03-04 DIAGNOSIS — Z95828 Presence of other vascular implants and grafts: Secondary | ICD-10-CM

## 2021-03-04 DIAGNOSIS — R5383 Other fatigue: Secondary | ICD-10-CM

## 2021-03-04 LAB — CBC WITH DIFFERENTIAL (CANCER CENTER ONLY)
Abs Immature Granulocytes: 0.03 10*3/uL (ref 0.00–0.07)
Basophils Absolute: 0.1 10*3/uL (ref 0.0–0.1)
Basophils Relative: 1 %
Eosinophils Absolute: 0.3 10*3/uL (ref 0.0–0.5)
Eosinophils Relative: 5 %
HCT: 39.1 % (ref 36.0–46.0)
Hemoglobin: 11.8 g/dL — ABNORMAL LOW (ref 12.0–15.0)
Immature Granulocytes: 1 %
Lymphocytes Relative: 19 %
Lymphs Abs: 1.2 10*3/uL (ref 0.7–4.0)
MCH: 23.9 pg — ABNORMAL LOW (ref 26.0–34.0)
MCHC: 30.2 g/dL (ref 30.0–36.0)
MCV: 79.1 fL — ABNORMAL LOW (ref 80.0–100.0)
Monocytes Absolute: 0.5 10*3/uL (ref 0.1–1.0)
Monocytes Relative: 8 %
Neutro Abs: 4 10*3/uL (ref 1.7–7.7)
Neutrophils Relative %: 66 %
Platelet Count: 277 10*3/uL (ref 150–400)
RBC: 4.94 MIL/uL (ref 3.87–5.11)
RDW: 17.7 % — ABNORMAL HIGH (ref 11.5–15.5)
WBC Count: 6.1 10*3/uL (ref 4.0–10.5)
nRBC: 0 % (ref 0.0–0.2)

## 2021-03-04 LAB — CMP (CANCER CENTER ONLY)
ALT: 12 U/L (ref 0–44)
AST: 18 U/L (ref 15–41)
Albumin: 3.9 g/dL (ref 3.5–5.0)
Alkaline Phosphatase: 73 U/L (ref 38–126)
Anion gap: 6 (ref 5–15)
BUN: 15 mg/dL (ref 8–23)
CO2: 27 mmol/L (ref 22–32)
Calcium: 9.1 mg/dL (ref 8.9–10.3)
Chloride: 107 mmol/L (ref 98–111)
Creatinine: 1.69 mg/dL — ABNORMAL HIGH (ref 0.44–1.00)
GFR, Estimated: 33 mL/min — ABNORMAL LOW (ref >60)
Glucose, Bld: 137 mg/dL — ABNORMAL HIGH (ref 70–99)
Potassium: 4.2 mmol/L (ref 3.5–5.1)
Sodium: 140 mmol/L (ref 135–145)
Total Bilirubin: 0.3 mg/dL (ref 0.3–1.2)
Total Protein: 7.2 g/dL (ref 6.5–8.1)

## 2021-03-04 LAB — TSH: TSH: 1.941 u[IU]/mL (ref 0.308–3.960)

## 2021-03-04 MED ORDER — SODIUM CHLORIDE 0.9% FLUSH
10.0000 mL | INTRAVENOUS | Status: DC | PRN
  Administered 2021-03-04: 09:00:00 10 mL

## 2021-03-04 MED ORDER — SODIUM CHLORIDE 0.9 % IV SOLN
200.0000 mg | Freq: Once | INTRAVENOUS | Status: AC
  Administered 2021-03-04: 11:00:00 200 mg via INTRAVENOUS
  Filled 2021-03-04: qty 200

## 2021-03-04 MED ORDER — SODIUM CHLORIDE 0.9 % IV SOLN: Freq: Once | INTRAVENOUS | Status: AC

## 2021-03-04 NOTE — Patient Instructions (Signed)
[facility]   Discharge Instructions: Thank you for choosing [facility] to provide your oncology and hematology care.   If you have a lab appointment with the [facility], please go directly to the [facility] and check in at the registration area.   Wear comfortable clothing and clothing appropriate for easy access to any Portacath or PICC line.   We strive to give you quality time with your provider. You may need to reschedule your appointment if you arrive late (15 or more minutes).  Arriving late affects you and other patients whose appointments are after yours.  Also, if you miss three or more appointments without notifying the office, you may be dismissed from the clinic at the providers discretion.      For prescription refill requests, have your pharmacy contact our office and allow 72 hours for refills to be completed.    Today you received the following chemotherapy and/or immunotherapy agents: pembrolizumab.      To help prevent nausea and vomiting after your treatment, we encourage you to take your nausea medication as directed.  BELOW ARE SYMPTOMS THAT SHOULD BE REPORTED IMMEDIATELY: *FEVER GREATER THAN 100.4 F (38 C) OR HIGHER *CHILLS OR SWEATING *NAUSEA AND VOMITING THAT IS NOT CONTROLLED WITH YOUR NAUSEA MEDICATION *UNUSUAL SHORTNESS OF BREATH *UNUSUAL BRUISING OR BLEEDING *URINARY PROBLEMS (pain or burning when urinating, or frequent urination) *BOWEL PROBLEMS (unusual diarrhea, constipation, pain near the anus) TENDERNESS IN MOUTH AND THROAT WITH OR WITHOUT PRESENCE OF ULCERS (sore throat, sores in mouth, or a toothache) UNUSUAL RASH, SWELLING OR PAIN  UNUSUAL VAGINAL DISCHARGE OR ITCHING   Items with * indicate a potential emergency and should be followed up as soon as possible or go to the Emergency Department if any problems should occur.  Please show the CHEMOTHERAPY ALERT CARD or IMMUNOTHERAPY ALERT CARD at  check-in to the Emergency Department and triage nurse.  Should you have questions after your visit or need to cancel or reschedule your appointment, please contact [facility]  Dept: [phone]  and follow the prompts.  Office hours are 8:00 a.m. to 4:30 p.m. Monday - Friday. Please note that voicemails left after 4:00 p.m. may not be returned until the following business day.  We are closed weekends and major holidays. You have access to a nurse at all times for urgent questions. Please call the main number to the clinic Dept: [phone] and follow the prompts.   For any non-urgent questions, you may also contact your provider using MyChart. We now offer e-Visits for anyone 39 and older to request care online for non-urgent symptoms. For details visit [email or web address].   Also download the MyChart app! Go to the app store, search "MyChart", open the app, select [facility], and log in with your MyChart username and password.  Due to Covid, a mask is required upon entering the hospital/clinic. If you do not have a mask, one will be given to you upon arrival. For doctor visits, patients may have 1 support person aged 41 or older with them. For treatment visits, patients cannot have anyone with them due to current Covid guidelines and our immunocompromised population.

## 2021-03-04 NOTE — Progress Notes (Signed)
Per Dr. Leander Rams is ok to treat pt today with [facility] and today's serum creatinine.

## 2021-03-04 NOTE — Progress Notes (Signed)
[facility] [phone]   [phone]) [phone]  OFFICE PROGRESS NOTE  [name], [place] [address]  DIAGNOSIS: Stage IV (T1c, N1, M1c ) non-small cell lung cancer, adenocarcinoma diagnosed in July 2020 and presented with left upper lobe lung nodule in addition to right upper lobe lung nodule with left hilar lymphadenopathy as well as multiple brain metastasis.  Biomarker Findings Microsatellite status - MS-Stable Tumor Mutational Burden - 4 Muts/Mb Genomic Findings For a complete list of the genes assayed, please refer to the Appendix. ATM G204* KRAS G12C PDGFRA P122T SMO R199W 7 Disease relevant genes with no reportable alterations: ALK, BRAF, EGFR, ERBB2, MET, RET, ROS1  PDL 1 expression was 1%  PRIOR THERAPY:  1) SRS to multiple brain metastasis under the care of Dr. Isidore Moos. 2) SRS to a 4 mm new brain metastasis under the care of Dr. Isidore Moos and Dr. Saintclair Halsted on 09/02/2020  CURRENT THERAPY: Systemic chemotherapy with carboplatin for AUC of 5, Alimta 500 mg/M2 and Keytruda 200 mg IV every 3 weeks.  First dose October 05, 2018.  Status post 42 cycles.  Starting from cycle #5 the patient will be on treatment with maintenance Alimta 500 mg/M2 and Keytruda 200 mg IV every 3 weeks.  She has been on treatment with single agent Keytruda recently secondary to renal insufficiency.  INTERVAL HISTORY: Carolyn [name] LIKE 66 y.o. female returns to the clinic today for follow-up visit.  The patient is feeling fine today with no concerning complaints except for pain on the right shoulder.  She was seen by orthopedic surgery and she is expected to have MRI of the shoulder next months.  She denied having any current chest pain, shortness of breath, cough or hemoptysis.  She denied having any fever or chills.  She has no nausea, vomiting, diarrhea or constipation.  She has no headache or visual changes.  She continues to tolerate  her treatment with Keytruda fairly well.  The patient had repeat CT scan of the chest, abdomen pelvis performed recently and she is here for evaluation and discussion of her scan results.  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  MEDICATIONS:  Current Outpatient Medications  Medication Sig Dispense Refill   acetaminophen (TYLENOL) 500 MG tablet Take 1,000 mg by mouth every 8 (eight) hours as needed for mild pain, fever or headache.      amLODipine (NORVASC) 10 MG tablet Take 1 tablet (10 mg total) by mouth daily. 90 tablet 3   aspirin EC 81 MG tablet Take 81 mg by mouth daily.  carvedilol (COREG) 25 MG tablet Take 1/2 (one-half) tablet by mouth twice daily 180 tablet 0   Cholecalciferol (VITAMIN D-3) 125 MCG (5000 UT) TABS Take 5,000 Units by mouth daily.     Chromium Picolinate 1000 MCG TABS Take 1,000 mg by mouth daily.     empagliflozin (JARDIANCE) 25 MG TABS tablet Take 1 tablet (25 mg total) by mouth daily before breakfast. 90 tablet 3   esomeprazole (NEXIUM) 20 MG capsule Take 1 capsule by mouth daily.     ezetimibe (ZETIA) 10 MG tablet TAKE 1 TABLET BY MOUTH ONCE DAILY AFTER SUPPER 90 tablet 3   fexofenadine (ALLEGRA) 180 MG tablet 1 tablet swallow whole with water; do not take with fruit juices.     Lancets (ONETOUCH DELICA PLUS DTOIZT24P) MISC  SMARTSIG:1 Topical 3 Times Daily     lidocaine-prilocaine (EMLA) cream Apply to the Port-A-Cath site 30 to 60 minutes before chemotherapy. 30 g 2   metFORMIN (GLUCOPHAGE) 500 MG tablet Take 500 mg by mouth daily after breakfast.     methocarbamol (ROBAXIN) 500 MG tablet Take 1 tablet (500 mg total) by mouth every 6 (six) hours as needed for muscle spasms. 21 tablet 0   nitroGLYCERIN (NITROSTAT) 0.4 MG SL tablet Place 1 tablet (0.4 mg total) under the tongue every 5 (five) minutes as needed for chest pain. 25 tablet 2   ONETOUCH ULTRA test strip USE 1 STRIP TO CHECK GLUCOSE THREE TIMES DAILY     Pitavastatin Calcium (LIVALO) 4 MG TABS Take 4 mg by mouth at bedtime.      telmisartan (MICARDIS) 40 MG tablet Take 1 tablet by mouth daily.     No current facility-administered medications for this visit.    SURGICAL HISTORY:  Past Surgical History:  Procedure Laterality Date   ABDOMINAL HYSTERECTOMY     bone spur     CARDIAC CATHETERIZATION  06/13/2012   carpel tunnel surgery Left    COLONOSCOPY     9 years ago   CORONARY ANGIOPLASTY  06/13/2012   EYE SURGERY     cyst left eye    IR IMAGING GUIDED PORT INSERTION  10/10/2018   LEFT HEART CATHETERIZATION WITH CORONARY ANGIOGRAM N/A 06/13/2012   Procedure: LEFT HEART CATHETERIZATION WITH CORONARY ANGIOGRAM;  Surgeon: Laverda Page, MD;  Location: [facility] CATH LAB;  Service: Cardiovascular;  Laterality: N/A;   NECK SURGERY     rotator cuff surgery Right 2015   VIDEO BRONCHOSCOPY WITH ENDOBRONCHIAL NAVIGATION N/A 09/06/2018   Procedure: VIDEO BRONCHOSCOPY WITH ENDOBRONCHIAL NAVIGATION, left upper lung;  Surgeon: Lajuana Matte, MD;  Location: [facility];  Service: Thoracic;  Laterality: N/A;   VIDEO BRONCHOSCOPY WITH ENDOBRONCHIAL ULTRASOUND Left 09/06/2018   Procedure: VIDEO BRONCHOSCOPY WITH ENDOBRONCHIAL ULTRASOUND, left lung;  Surgeon: Lajuana Matte, MD;  Location: [facility];  Service: Thoracic;  Laterality: Left;   WISDOM TOOTH EXTRACTION       REVIEW OF SYSTEMS:  Constitutional: negative Eyes: negative Ears, nose, mouth, throat, and face: negative Respiratory: negative Cardiovascular: negative Gastrointestinal: negative Genitourinary:negative Integument/breast: negative Hematologic/lymphatic: negative Musculoskeletal:positive for arthralgias Neurological: negative Behavioral/Psych: negative Endocrine: negative Allergic/Immunologic: negative   PHYSICAL EXAMINATION: General appearance: alert, cooperative, fatigued, and no distress Head: Normocephalic, without obvious abnormality, atraumatic Neck: no adenopathy, no JVD, supple, symmetrical, trachea midline, and thyroid not enlarged, symmetric, no tenderness/mass/nodules Lymph nodes: Cervical, supraclavicular, and axillary nodes normal. Resp: clear to auscultation bilaterally Back: symmetric, no curvature. ROM normal. No CVA tenderness. Cardio: regular rate and rhythm, S1, S2 normal,  no murmur, click, rub or gallop GI: soft, non-tender; bowel sounds normal; no masses,  no organomegaly Extremities: extremities normal, atraumatic, no cyanosis or edema Neurologic: Alert and oriented X 3, normal strength and tone. Normal symmetric reflexes. Normal coordination and gait  ECOG PERFORMANCE STATUS: 1 - Symptomatic but completely ambulatory  Blood pressure 132/72, pulse (!) 58, temperature (!) 97.3 F (36.3 C), temperature source Tympanic, resp. rate 19, height 5' 0.5" (1.537 m), weight 178 lb 4.8 oz (80.9 kg), SpO2 100 %.  LABORATORY DATA: Lab Results  Component Value Date   WBC 6.1 03/04/2021   HGB 11.8 (L) 03/04/2021   HCT 39.1 03/04/2021   MCV 79.1 (L) 03/04/2021   PLT 277 03/04/2021      Chemistry      Component Value Date/Time   NA 141 02/12/2021 0749   NA 139 08/18/2020 0801   K 4.3 02/12/2021 0749   CL 105 02/12/2021 0749   CO2 28 02/12/2021 0749   BUN 22 02/12/2021 0749   BUN 16 08/18/2020 0801   CREATININE 1.69 (H) 02/12/2021 0749      Component  Value Date/Time   CALCIUM 9.1 02/12/2021 0749   ALKPHOS 73 02/12/2021 0749   AST 18 02/12/2021 0749   ALT 12 02/12/2021 0749   BILITOT 0.4 02/12/2021 0749       RADIOGRAPHIC STUDIES: CT Abdomen Pelvis Wo Contrast  Result Date: 03/03/2021 CLINICAL DATA:  Primary Cancer Type: Lung Imaging Indication: Assess response to therapy Interval therapy since last imaging? Yes Initial Cancer Diagnosis Date: 09/06/2018; Established by: Biopsy-proven Detailed Pathology: Stage IV non-small cell lung cancer, adenocarcinoma. Primary Tumor location: Left upper lobe lung and right upper lobe. Multiple brain metastases. Surgeries: Coronary stent. Hysterectomy. Chemotherapy: Yes; Ongoing? No; Most recent administration: 03/01/2019 Immunotherapy?  Yes; Type: Keytruda; Ongoing? Yes Radiation therapy? Yes Date Range: 09/02/2020; Target: SRS to a 4 mm new brain metastasis Date Range: 09/22/2018; Target: SRS to multiple brain metastasis EXAM: CT CHEST, ABDOMEN AND PELVIS WITHOUT CONTRAST TECHNIQUE: Multidetector CT imaging of the chest, abdomen and pelvis was performed following the standard protocol without IV contrast. RADIATION DOSE REDUCTION: This exam was performed according to the departmental dose-optimization program which includes automated exposure control, adjustment of the mA and/or kV according to patient size and/or use of iterative reconstruction technique. COMPARISON:  Most recent CT chest, abdomen and pelvis 12/09/2020. 08/28/2018 PET-CT. FINDINGS: CT CHEST FINDINGS Cardiovascular: Right Port-A-Cath tip: Cavoatrial junction. Coronary, aortic arch, and branch vessel atherosclerotic vascular disease. Mild to moderate localized anterior pericardial effusion similar to previous. Mediastinum/Nodes: Small type 1 hiatal hernia. No pathologic adenopathy observed. Lungs/Pleura: Mild biapical pleuroparenchymal scarring. The bandlike residuum of the previous right upper lobe pulmonary artery measures up to 2 mm in  thickness on axial images such as image 36 of series 4, stable from 12/09/2020. The left upper lobe nodule has a solid central component measuring 7 by 6 by 7 mm on image 45 of series 4, previously same by my measurements. This nodule also has spiculated marginal components which are not included in this measurement. 6 by 5 mm ground-glass density pulmonary nodule in the right middle lobe on image 68 series 4 appears stable. There is also a stable adjacent 3 mm stable ground-glass density right middle lobe pulmonary nodule on image 67 series 4. Stable 5 by 4 mm ground-glass density pulmonary nodule in the right lower lobe, image 62 series 4. Stable 5 by 3 mm ground-glass density pulmonary nodule anteriorly in the left lower lobe on  image 57 series 4. Musculoskeletal: Lower cervicothoracic spondylosis. Lower cervical plate and screw fixator. CT ABDOMEN PELVIS FINDINGS Hepatobiliary: Unremarkable Pancreas: Unremarkable Spleen: Unremarkable Adrenals/Urinary Tract: Unremarkable Stomach/Bowel: Periampullary duodenal diverticulum. Normal appendix. Otherwise unremarkable. Vascular/Lymphatic: Atherosclerosis is present, including aortoiliac atherosclerotic disease. Reproductive: Uterus absent.  Adnexa unremarkable. Other: No supplemental non-categorized findings. Musculoskeletal: 7 mm of degenerative anterolisthesis at L4-5. Bilateral foraminal impingement at L3-4 and L4-5. The broad left transverse process of L5 pseudoarticulates with the sacrum. IMPRESSION: 1. Stable left upper lobe nodule, solid portion measuring 7 by 6 by 7 mm. No new nodule identified. 2. Several small ground-glass density nodules at 6 mm or less in diameter, unchanged. 3. Other imaging findings of potential clinical significance: Aortic Atherosclerosis (ICD10-I70.0). Coronary atherosclerosis. Localized anterior pericardial effusion similar to previous. Small type 1 hiatal hernia. Spondylosis. 7 mm degenerative anterolisthesis at L4-5 with bilateral  foraminal impingement at L4-5 and L5-S1. Electronically Signed   By: Van Clines M.D.   On: 03/03/2021 13:13   CT Chest Wo Contrast  Result Date: 03/03/2021 CLINICAL DATA:  Primary Cancer Type: Lung Imaging Indication: Assess response to therapy Interval therapy since last imaging? Yes Initial Cancer Diagnosis Date: 09/06/2018; Established by: Biopsy-proven Detailed Pathology: Stage IV non-small cell lung cancer, adenocarcinoma. Primary Tumor location: Left upper lobe lung and right upper lobe. Multiple brain metastases. Surgeries: Coronary stent. Hysterectomy. Chemotherapy: Yes; Ongoing? No; Most recent administration: 03/01/2019 Immunotherapy?  Yes; Type: Keytruda; Ongoing? Yes Radiation therapy? Yes Date Range: 09/02/2020; Target: SRS to a 4 mm new brain metastasis Date Range: 09/22/2018; Target: SRS to multiple brain metastasis EXAM: CT CHEST, ABDOMEN AND PELVIS WITHOUT CONTRAST TECHNIQUE: Multidetector CT imaging of the chest, abdomen and pelvis was performed following the standard protocol without IV contrast. RADIATION DOSE REDUCTION: This exam was performed according to the departmental dose-optimization program which includes automated exposure control, adjustment of the mA and/or kV according to patient size and/or use of iterative reconstruction technique. COMPARISON:  Most recent CT chest, abdomen and pelvis 12/09/2020. 08/28/2018 PET-CT. FINDINGS: CT CHEST FINDINGS Cardiovascular: Right Port-A-Cath tip: Cavoatrial junction. Coronary, aortic arch, and branch vessel atherosclerotic vascular disease. Mild to moderate localized anterior pericardial effusion similar to previous. Mediastinum/Nodes: Small type 1 hiatal hernia. No pathologic adenopathy observed. Lungs/Pleura: Mild biapical pleuroparenchymal scarring. The bandlike residuum of the previous right upper lobe pulmonary artery measures up to 2 mm in thickness on axial images such as image 36 of series 4, stable from 12/09/2020. The left  upper lobe nodule has a solid central component measuring 7 by 6 by 7 mm on image 45 of series 4, previously same by my measurements. This nodule also has spiculated marginal components which are not included in this measurement. 6 by 5 mm ground-glass density pulmonary nodule in the right middle lobe on image 68 series 4 appears stable. There is also a stable adjacent 3 mm stable ground-glass density right middle lobe pulmonary nodule on image 67 series 4. Stable 5 by 4 mm ground-glass density pulmonary nodule in the right lower lobe, image 62 series 4. Stable 5 by 3 mm ground-glass density pulmonary nodule anteriorly in the left lower lobe on image 57 series 4. Musculoskeletal: Lower cervicothoracic spondylosis. Lower cervical plate and screw fixator. CT ABDOMEN PELVIS FINDINGS Hepatobiliary: Unremarkable Pancreas: Unremarkable Spleen: Unremarkable Adrenals/Urinary Tract: Unremarkable Stomach/Bowel: Periampullary duodenal diverticulum. Normal appendix. Otherwise unremarkable. Vascular/Lymphatic: Atherosclerosis is present, including aortoiliac atherosclerotic disease. Reproductive: Uterus absent.  Adnexa unremarkable. Other: No supplemental non-categorized findings. Musculoskeletal: 7 mm of degenerative anterolisthesis at L4-5.  Bilateral foraminal impingement at L3-4 and L4-5. The broad left transverse process of L5 pseudoarticulates with the sacrum. IMPRESSION: 1. Stable left upper lobe nodule, solid portion measuring 7 by 6 by 7 mm. No new nodule identified. 2. Several small ground-glass density nodules at 6 mm or less in diameter, unchanged. 3. Other imaging findings of potential clinical significance: Aortic Atherosclerosis (ICD10-I70.0). Coronary atherosclerosis. Localized anterior pericardial effusion similar to previous. Small type 1 hiatal hernia. Spondylosis. 7 mm degenerative anterolisthesis at L4-5 with bilateral foraminal impingement at L4-5 and L5-S1. Electronically Signed   By: Van Clines  M.D.   On: 03/03/2021 13:13   XR Shoulder Left  Result Date: 03/02/2021 AP axillary outlet radiographs left shoulder reviewed.  No acute fracture.  No significant glenohumeral joint arthritis or AC joint arthritis.  Acromiohumeral distance maintained.  No structural bony abnormalities.  Visualized lung fields without obvious parenchymal lesions.    ASSESSMENT AND PLAN: This is a very pleasant 66 years old African-American female with likely stage IV (T1c, N1, M1C) non-small cell lung cancer, adenocarcinoma with K-ras G12C mutation diagnosed in July 2020 and presented with left upper lobe lung nodule in addition to right upper lobe pulmonary nodule as well as left hilar adenopathy and metastatic lesion to the brain. Molecular studies showed no actionable mutation and PDL 1 expression was 1%. The patient underwent SBRT to the metastatic brain lesion under the care of Dr. Isidore Moos. The patient is currently undergoing systemic chemotherapy with carboplatin for AUC of 5, Alimta 500 mg/M2 and Keytruda 200 mg IV every 3 weeks is status post 41 cycles.  Starting from cycle #5 she is on maintenance treatment with Alimta and Keytruda every 3 weeks with only Keytruda on the last few cycles because of the renal insufficiency. The patient has been tolerating this treatment well with no concerning adverse effects. She had repeat CT scan of the chest, abdomen pelvis performed recently.  I personally and independently reviewed the scans and discussed the results with the patient today. Her scan showed no concerning findings for disease progression. I recommended for her to continue her current treatment with maintenance Keytruda and she will proceed with cycle #43 today as planned. For the right shoulder pain, she is scheduled to have MRI of the right shoulder next month and she is followed by orthopedic surgery Dr. Marlou Sa. The patient was advised to call immediately if she has any other concerning symptoms in the  interval.  The patient voices understanding of current disease status and treatment options and is in agreement with the current care plan. All questions were answered. The patient knows to call the clinic with any problems, questions or concerns. We can certainly see the patient much sooner if necessary.  Disclaimer: This note was dictated with voice recognition software. Similar sounding words can inadvertently be transcribed and may not be corrected upon review.

## 2021-03-05 ENCOUNTER — Telehealth: Payer: Self-pay | Admitting: Orthopedic Surgery

## 2021-03-05 NOTE — Telephone Encounter (Signed)
LMOM for pt to return call to sch a post MRI after 03/20/21 with GD

## 2021-03-09 ENCOUNTER — Other Ambulatory Visit: Payer: Self-pay | Admitting: Cardiology

## 2021-03-09 DIAGNOSIS — I10 Essential (primary) hypertension: Secondary | ICD-10-CM

## 2021-03-12 ENCOUNTER — Telehealth: Payer: Self-pay | Admitting: Cardiology

## 2021-03-12 DIAGNOSIS — R768 Other specified abnormal immunological findings in serum: Secondary | ICD-10-CM | POA: Diagnosis not present

## 2021-03-12 DIAGNOSIS — M549 Dorsalgia, unspecified: Secondary | ICD-10-CM | POA: Diagnosis not present

## 2021-03-12 DIAGNOSIS — N1831 Chronic kidney disease, stage 3a: Secondary | ICD-10-CM | POA: Diagnosis not present

## 2021-03-12 DIAGNOSIS — M1990 Unspecified osteoarthritis, unspecified site: Secondary | ICD-10-CM | POA: Diagnosis not present

## 2021-03-12 DIAGNOSIS — M2550 Pain in unspecified joint: Secondary | ICD-10-CM | POA: Diagnosis not present

## 2021-03-12 DIAGNOSIS — M25512 Pain in left shoulder: Secondary | ICD-10-CM | POA: Diagnosis not present

## 2021-03-12 DIAGNOSIS — M79642 Pain in left hand: Secondary | ICD-10-CM | POA: Diagnosis not present

## 2021-03-12 DIAGNOSIS — C3490 Malignant neoplasm of unspecified part of unspecified bronchus or lung: Secondary | ICD-10-CM | POA: Diagnosis not present

## 2021-03-12 DIAGNOSIS — M7989 Other specified soft tissue disorders: Secondary | ICD-10-CM | POA: Diagnosis not present

## 2021-03-12 NOTE — Telephone Encounter (Signed)
Patient requesting refill for livalo 4 MG. I can't see if that is a prescription by Dr. Einar Gip or another provider. Can someone take a look at it?

## 2021-03-12 NOTE — Telephone Encounter (Signed)
Yes and ask her if it is expensive, there is a pharmacy that will fill for I think $30-50 for 3 months

## 2021-03-13 ENCOUNTER — Other Ambulatory Visit: Payer: Self-pay

## 2021-03-13 DIAGNOSIS — M2550 Pain in unspecified joint: Secondary | ICD-10-CM | POA: Diagnosis not present

## 2021-03-13 MED ORDER — LIVALO 4 MG PO TABS: 4.0000 mg | ORAL_TABLET | Freq: Every day | ORAL | 1 refills | Status: DC

## 2021-03-17 DIAGNOSIS — M2550 Pain in unspecified joint: Secondary | ICD-10-CM | POA: Diagnosis not present

## 2021-03-18 DIAGNOSIS — Z20822 Contact with and (suspected) exposure to covid-19: Secondary | ICD-10-CM | POA: Diagnosis not present

## 2021-03-19 ENCOUNTER — Other Ambulatory Visit: Payer: Self-pay

## 2021-03-19 ENCOUNTER — Ambulatory Visit
Admission: RE | Admit: 2021-03-19 | Discharge: 2021-03-19 | Disposition: A | Discharge status: Home / Self Care | Payer: Medicare Other | Source: Ambulatory Visit | Attending: Radiation Oncology | Admitting: Radiation Oncology

## 2021-03-19 DIAGNOSIS — G936 Cerebral edema: Secondary | ICD-10-CM | POA: Diagnosis not present

## 2021-03-19 DIAGNOSIS — C7931 Secondary malignant neoplasm of brain: Secondary | ICD-10-CM | POA: Diagnosis not present

## 2021-03-19 DIAGNOSIS — G9389 Other specified disorders of brain: Secondary | ICD-10-CM | POA: Diagnosis not present

## 2021-03-19 DIAGNOSIS — C3490 Malignant neoplasm of unspecified part of unspecified bronchus or lung: Secondary | ICD-10-CM | POA: Diagnosis not present

## 2021-03-19 MED ORDER — GADOBENATE DIMEGLUMINE 529 MG/ML IV SOLN
18.0000 mL | Freq: Once | INTRAVENOUS | Status: AC | PRN
  Administered 2021-03-19: 18 mL via INTRAVENOUS

## 2021-03-20 ENCOUNTER — Other Ambulatory Visit: Payer: Medicare Other

## 2021-03-23 ENCOUNTER — Inpatient Hospital Stay: Payer: Medicare Other | Attending: Physician Assistant

## 2021-03-23 DIAGNOSIS — C7931 Secondary malignant neoplasm of brain: Secondary | ICD-10-CM | POA: Insufficient documentation

## 2021-03-23 DIAGNOSIS — Z79899 Other long term (current) drug therapy: Secondary | ICD-10-CM | POA: Insufficient documentation

## 2021-03-23 DIAGNOSIS — C3412 Malignant neoplasm of upper lobe, left bronchus or lung: Secondary | ICD-10-CM | POA: Insufficient documentation

## 2021-03-23 DIAGNOSIS — Z5112 Encounter for antineoplastic immunotherapy: Secondary | ICD-10-CM | POA: Insufficient documentation

## 2021-03-23 NOTE — Progress Notes (Signed)
[facility] OFFICE PROGRESS NOTE  [name], [place] [id number]  DIAGNOSIS: Stage IV (T1c, N1, M1c ) non-small cell lung cancer, adenocarcinoma diagnosed in July 2020 and presented with left upper lobe lung nodule in addition to right upper lobe lung nodule with left hilar lymphadenopathy as well as multiple brain metastasis.   Biomarker Findings Microsatellite status - MS-Stable Tumor Mutational Burden - 4 Muts/Mb Genomic Findings For a complete list of the genes assayed, please refer to the Appendix. ATM G204* KRAS G12C PDGFRA P122T SMO R199W 7 Disease relevant genes with no reportable alterations: ALK, BRAF, EGFR, ERBB2, MET, RET, ROS1   PDL 1 expression was 1%  PRIOR THERAPY: 1) SBRT to multiple brain metastasis under the care of Dr. Isidore Moos. Completed in August 2020 2) SRS to the new metastatic brain lesions under the care of Dr. Saintclair Halsted and Dr. Isidore Moos on 09/02/20 and 12/16/20  CURRENT THERAPY:  Systemic chemotherapy with carboplatin for AUC of 5, Alimta 500 mg/M2 and Keytruda 200 mg IV every 3 weeks.  First dose October 05, 2018.  Status post 43 cycles.  Starting from cycle #5 the patient will be on treatment with maintenance Alimta 500 mg/M2 and Keytruda 200 mg IV every 3 weeks.  She has been on treatment with single agent Keytruda secondary to renal insufficiency  INTERVAL HISTORY: CATERA HANKINS 66 y.o. female returns to the clinic today for a follow-up visit.  The patient is feeling fairly well today without any concerning complaints.  She has been having some left arm pain since having of COVID-19 injection in her arm.  She is scheduled for an MRI of the shoulder on 03/27/2020. She went to an urgent care yesterday. She felt a "pop" in her shoulder a few days ago and worsening decreased ROM. Otherwise the patient denies any concerning complaints today.  Denies any recent fever, chills, or weight loss. She reports her  baseline night sweats approximately 1x per week.  She denies any chest pain, shortness of breath, cough, or hemoptysis.  Denies any nausea, vomiting, diarrhea, or constipation.  Denies any rashes or skin changes.  She denies any recent headache or visual changes.  She recently had a repeat brain MRI. She is wondering who she is supposed to follow up with regarding her results. She had an appointment with Dr. Saintclair Halsted yesterday but was not aware of it and missed the appointment. She is followed closely by nephrology due to her renal insufficiency.  Her chemotherapy with Alimta was discontinued due to renal insufficiency.  She is here today for evaluation and repeat blood work before starting cycle #44.    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  MEDICATIONS:  Current Outpatient Medications  Medication Sig Dispense Refill   acetaminophen (TYLENOL) 500 MG tablet Take 1,000 mg by mouth every 8 (eight) hours as needed for mild pain, fever or headache.      amLODipine (NORVASC) 10 MG tablet Take 1 tablet by mouth once daily 90 tablet 0   aspirin EC 81 MG tablet Take 81 mg by mouth daily.     carvedilol (COREG) 25 MG tablet Take 1/2 (one-half) tablet by mouth twice daily 180 tablet 0   Cholecalciferol (VITAMIN D-3) 125 MCG (5000 UT) TABS Take 5,000 Units by mouth daily.      Chromium Picolinate 1000 MCG TABS Take 1,000 mg by mouth daily.     empagliflozin (JARDIANCE) 25 MG TABS tablet Take 1 tablet (25 mg total) by mouth daily before breakfast. 90 tablet 3   esomeprazole (NEXIUM) 20 MG capsule Take 1 capsule by mouth daily.     ezetimibe (ZETIA) 10 MG tablet TAKE 1 TABLET BY MOUTH ONCE DAILY AFTER SUPPER 90 tablet 3   fexofenadine (ALLEGRA) 180 MG tablet 1 tablet swallow whole with water; do not take with fruit juices.     HYDROcodone-acetaminophen (NORCO) 7.5-325 MG tablet Take 1 tablet by mouth every 6 (six) hours as needed for moderate pain. 12 tablet 0   Lancets (ONETOUCH DELICA PLUS [id number]) MISC SMARTSIG:1 Topical 3 Times Daily     lidocaine-prilocaine (EMLA) cream Apply to the Port-A-Cath site 30 to 60 minutes before chemotherapy. 30 g 2   metFORMIN (GLUCOPHAGE) 500 MG tablet Take 500 mg by mouth daily after breakfast.     methocarbamol (ROBAXIN) 500 MG tablet Take 1 tablet (500 mg total) by mouth every 6 (six) hours as needed for muscle spasms. 21 tablet 0   nitroGLYCERIN (NITROSTAT) 0.4 MG SL tablet Place 1 tablet (0.4 mg total) under the tongue every 5 (five) minutes as needed for chest pain. 25 tablet 2   ONETOUCH ULTRA test strip USE 1 STRIP TO CHECK GLUCOSE THREE TIMES DAILY     Pitavastatin Calcium (LIVALO) 4 MG TABS Take 1 tablet (4 mg total) by mouth at bedtime. 90 tablet 1   predniSONE (DELTASONE) 5 MG tablet TAKE 6 TABLETS ONCE DAILY FOR 2 DAYS THEN TAKE 5 TABLETS FOR 2 DAYS THEN TAKE 4 TABLETS FOR 2 DAYS THEN TAKE 3 TABLETS FOR 2 DAYS THEN TAKE 2 TABLETS FOR 2 DAYS THEN TAKE 1 TABLET FOR 2 DAYS. TAKE ALL DOSES WITH FOOD.     telmisartan (MICARDIS) 40 MG tablet Take 1 tablet by mouth daily.     No current facility-administered medications for this visit.    SURGICAL HISTORY:  Past Surgical History:  Procedure Laterality Date   ABDOMINAL HYSTERECTOMY     bone spur     CARDIAC CATHETERIZATION  06/13/2012   carpel tunnel surgery Left     COLONOSCOPY     9 years ago   CORONARY ANGIOPLASTY  06/13/2012   EYE SURGERY     cyst left eye    IR IMAGING GUIDED PORT INSERTION  10/10/2018   LEFT HEART CATHETERIZATION WITH CORONARY ANGIOGRAM N/A 06/13/2012   Procedure: LEFT HEART CATHETERIZATION WITH CORONARY ANGIOGRAM;  Surgeon: Laverda Page, MD;  Location: [facility] CATH LAB;  Service: Cardiovascular;  Laterality: N/A;   NECK SURGERY     rotator cuff surgery Right 2015   VIDEO BRONCHOSCOPY WITH ENDOBRONCHIAL NAVIGATION N/A 09/06/2018   Procedure:  VIDEO BRONCHOSCOPY WITH ENDOBRONCHIAL NAVIGATION, left upper lung;  Surgeon: Lajuana Matte, MD;  Location: [facility];  Service: Thoracic;  Laterality: N/A;   VIDEO BRONCHOSCOPY WITH ENDOBRONCHIAL ULTRASOUND Left 09/06/2018   Procedure: VIDEO BRONCHOSCOPY WITH ENDOBRONCHIAL ULTRASOUND, left lung;  Surgeon: Lajuana Matte, MD;  Location: [facility];  Service: Thoracic;  Laterality: Left;   WISDOM TOOTH EXTRACTION      REVIEW OF SYSTEMS:   Review of Systems  Constitutional: Negative for appetite change, chills, fatigue, fever and unexpected weight change.  HENT:   Negative for mouth sores, nosebleeds, sore throat and trouble swallowing.   Eyes: Negative for eye problems and icterus.  Respiratory: Negative for cough, hemoptysis, shortness of breath and wheezing.   Cardiovascular: Negative for chest pain and leg swelling.  Gastrointestinal: Negative for abdominal pain, constipation, diarrhea, nausea and vomiting.  Genitourinary: Negative for bladder incontinence, difficulty urinating, dysuria, frequency and hematuria.   Musculoskeletal: Positive for left shoulder pain and decreased ROM. Negative for back pain, gait problem, neck pain and neck stiffness.  Skin: Negative for itching and rash.  Neurological: Negative for dizziness, extremity weakness, gait problem, headaches, light-headedness and seizures.  Hematological: Negative for adenopathy. Does not bruise/bleed easily.  Psychiatric/Behavioral:  Negative for confusion, depression and sleep disturbance. The patient is not nervous/anxious.     PHYSICAL EXAMINATION:  Blood pressure (!) 144/61, pulse 98, temperature 97.7 F (36.5 C), temperature source Tympanic, resp. rate 16, weight 182 lb 3.2 oz (82.6 kg), SpO2 99 %.  ECOG PERFORMANCE STATUS: 1  Physical Exam  Constitutional: Oriented to person, place, and time and well-developed, well-nourished, and in no distress. No distress.  HENT:  Head: Normocephalic and atraumatic.  Mouth/Throat: Oropharynx is clear and moist. No oropharyngeal exudate.  Eyes: Conjunctivae are normal. Right eye exhibits no discharge. Left eye exhibits no discharge. No scleral icterus.  Neck: Normal range of motion. Neck supple.  Cardiovascular: Normal rate, regular rhythm, normal heart sounds and intact distal pulses.   Pulmonary/Chest: Effort normal and breath sounds normal. No respiratory distress. No wheezes. No rales.  Abdominal: Soft. Bowel sounds are normal. Exhibits no distension and no mass. There is no tenderness.  Musculoskeletal: Positive for decreased ROM of the left shoulder.  Exhibits no edema.  Lymphadenopathy:    No cervical adenopathy.  Neurological: Alert and oriented to person, place, and time. Exhibits normal muscle tone. Gait normal. Coordination normal.  Skin: Skin is warm and dry. No rash noted. Not diaphoretic. No erythema. No pallor.  Psychiatric: Mood, memory and judgment normal.  Vitals reviewed.  LABORATORY DATA: Lab Results  Component Value Date   WBC 12.8 (H) 03/25/2021   HGB 12.0 03/25/2021   HCT 39.6 03/25/2021   MCV 79.2 (L) 03/25/2021   PLT 287 03/25/2021      Chemistry      Component Value Date/Time   NA 134 (L) 03/25/2021 0800   NA 139 08/18/2020 0801   K 4.3 03/25/2021 0800   CL 100 03/25/2021 0800   CO2 23 03/25/2021 0800   BUN 23 03/25/2021 0800   BUN 16 08/18/2020 0801   CREATININE 1.65 (H) 03/25/2021 0800      Component Value Date/Time   CALCIUM  8.5 (L) 03/25/2021 0800   ALKPHOS 58 03/25/2021 0800   AST 30 03/25/2021 0800   ALT 28 03/25/2021 0800   BILITOT 0.4 03/25/2021 0800       RADIOGRAPHIC STUDIES:  CT Abdomen Pelvis Wo Contrast  Result Date: 03/03/2021 CLINICAL DATA:  Primary Cancer  Type: Lung Imaging Indication: Assess response to therapy Interval therapy since last imaging? Yes Initial Cancer Diagnosis Date: 09/06/2018; Established by: Biopsy-proven Detailed Pathology: Stage IV non-small cell lung cancer, adenocarcinoma. Primary Tumor location: Left upper lobe lung and right upper lobe. Multiple brain metastases. Surgeries: Coronary stent. Hysterectomy. Chemotherapy: Yes; Ongoing? No; Most recent administration: 03/01/2019 Immunotherapy?  Yes; Type: Keytruda; Ongoing? Yes Radiation therapy? Yes Date Range: 09/02/2020; Target: SRS to a 4 mm new brain metastasis Date Range: 09/22/2018; Target: SRS to multiple brain metastasis EXAM: CT CHEST, ABDOMEN AND PELVIS WITHOUT CONTRAST TECHNIQUE: Multidetector CT imaging of the chest, abdomen and pelvis was performed following the standard protocol without IV contrast. RADIATION DOSE REDUCTION: This exam was performed according to the departmental dose-optimization program which includes automated exposure control, adjustment of the mA and/or kV according to patient size and/or use of iterative reconstruction technique. COMPARISON:  Most recent CT chest, abdomen and pelvis 12/09/2020. 08/28/2018 PET-CT. FINDINGS: CT CHEST FINDINGS Cardiovascular: Right Port-A-Cath tip: Cavoatrial junction. Coronary, aortic arch, and branch vessel atherosclerotic vascular disease. Mild to moderate localized anterior pericardial effusion similar to previous. Mediastinum/Nodes: Small type 1 hiatal hernia. No pathologic adenopathy observed. Lungs/Pleura: Mild biapical pleuroparenchymal scarring. The bandlike residuum of the previous right upper lobe pulmonary artery measures up to 2 mm in thickness on axial images  such as image 36 of series 4, stable from 12/09/2020. The left upper lobe nodule has a solid central component measuring 7 by 6 by 7 mm on image 45 of series 4, previously same by my measurements. This nodule also has spiculated marginal components which are not included in this measurement. 6 by 5 mm ground-glass density pulmonary nodule in the right middle lobe on image 68 series 4 appears stable. There is also a stable adjacent 3 mm stable ground-glass density right middle lobe pulmonary nodule on image 67 series 4. Stable 5 by 4 mm ground-glass density pulmonary nodule in the right lower lobe, image 62 series 4. Stable 5 by 3 mm ground-glass density pulmonary nodule anteriorly in the left lower lobe on image 57 series 4. Musculoskeletal: Lower cervicothoracic spondylosis. Lower cervical plate and screw fixator. CT ABDOMEN PELVIS FINDINGS Hepatobiliary: Unremarkable Pancreas: Unremarkable Spleen: Unremarkable Adrenals/Urinary Tract: Unremarkable Stomach/Bowel: Periampullary duodenal diverticulum. Normal appendix. Otherwise unremarkable. Vascular/Lymphatic: Atherosclerosis is present, including aortoiliac atherosclerotic disease. Reproductive: Uterus absent.  Adnexa unremarkable. Other: No supplemental non-categorized findings. Musculoskeletal: 7 mm of degenerative anterolisthesis at L4-5. Bilateral foraminal impingement at L3-4 and L4-5. The broad left transverse process of L5 pseudoarticulates with the sacrum. IMPRESSION: 1. Stable left upper lobe nodule, solid portion measuring 7 by 6 by 7 mm. No new nodule identified. 2. Several small ground-glass density nodules at 6 mm or less in diameter, unchanged. 3. Other imaging findings of potential clinical significance: Aortic Atherosclerosis (ICD10-I70.0). Coronary atherosclerosis. Localized anterior pericardial effusion similar to previous. Small type 1 hiatal hernia. Spondylosis. 7 mm degenerative anterolisthesis at L4-5 with bilateral foraminal impingement at  L4-5 and L5-S1. Electronically Signed   By: Van Clines M.D.   On: 03/03/2021 13:13   CT Chest Wo Contrast  Result Date: 03/03/2021 CLINICAL DATA:  Primary Cancer Type: Lung Imaging Indication: Assess response to therapy Interval therapy since last imaging? Yes Initial Cancer Diagnosis Date: 09/06/2018; Established by: Biopsy-proven Detailed Pathology: Stage IV non-small cell lung cancer, adenocarcinoma. Primary Tumor location: Left upper lobe lung and right upper lobe. Multiple brain metastases. Surgeries: Coronary stent. Hysterectomy. Chemotherapy: Yes; Ongoing? No; Most recent administration: 03/01/2019 Immunotherapy?  Yes; Type: Keytruda;  Ongoing? Yes Radiation therapy? Yes Date Range: 09/02/2020; Target: SRS to a 4 mm new brain metastasis Date Range: 09/22/2018; Target: SRS to multiple brain metastasis EXAM: CT CHEST, ABDOMEN AND PELVIS WITHOUT CONTRAST TECHNIQUE: Multidetector CT imaging of the chest, abdomen and pelvis was performed following the standard protocol without IV contrast. RADIATION DOSE REDUCTION: This exam was performed according to the departmental dose-optimization program which includes automated exposure control, adjustment of the mA and/or kV according to patient size and/or use of iterative reconstruction technique. COMPARISON:  Most recent CT chest, abdomen and pelvis 12/09/2020. 08/28/2018 PET-CT. FINDINGS: CT CHEST FINDINGS Cardiovascular: Right Port-A-Cath tip: Cavoatrial junction. Coronary, aortic arch, and branch vessel atherosclerotic vascular disease. Mild to moderate localized anterior pericardial effusion similar to previous. Mediastinum/Nodes: Small type 1 hiatal hernia. No pathologic adenopathy observed. Lungs/Pleura: Mild biapical pleuroparenchymal scarring. The bandlike residuum of the previous right upper lobe pulmonary artery measures up to 2 mm in thickness on axial images such as image 36 of series 4, stable from 12/09/2020. The left upper lobe nodule has a  solid central component measuring 7 by 6 by 7 mm on image 45 of series 4, previously same by my measurements. This nodule also has spiculated marginal components which are not included in this measurement. 6 by 5 mm ground-glass density pulmonary nodule in the right middle lobe on image 68 series 4 appears stable. There is also a stable adjacent 3 mm stable ground-glass density right middle lobe pulmonary nodule on image 67 series 4. Stable 5 by 4 mm ground-glass density pulmonary nodule in the right lower lobe, image 62 series 4. Stable 5 by 3 mm ground-glass density pulmonary nodule anteriorly in the left lower lobe on image 57 series 4. Musculoskeletal: Lower cervicothoracic spondylosis. Lower cervical plate and screw fixator. CT ABDOMEN PELVIS FINDINGS Hepatobiliary: Unremarkable Pancreas: Unremarkable Spleen: Unremarkable Adrenals/Urinary Tract: Unremarkable Stomach/Bowel: Periampullary duodenal diverticulum. Normal appendix. Otherwise unremarkable. Vascular/Lymphatic: Atherosclerosis is present, including aortoiliac atherosclerotic disease. Reproductive: Uterus absent.  Adnexa unremarkable. Other: No supplemental non-categorized findings. Musculoskeletal: 7 mm of degenerative anterolisthesis at L4-5. Bilateral foraminal impingement at L3-4 and L4-5. The broad left transverse process of L5 pseudoarticulates with the sacrum. IMPRESSION: 1. Stable left upper lobe nodule, solid portion measuring 7 by 6 by 7 mm. No new nodule identified. 2. Several small ground-glass density nodules at 6 mm or less in diameter, unchanged. 3. Other imaging findings of potential clinical significance: Aortic Atherosclerosis (ICD10-I70.0). Coronary atherosclerosis. Localized anterior pericardial effusion similar to previous. Small type 1 hiatal hernia. Spondylosis. 7 mm degenerative anterolisthesis at L4-5 with bilateral foraminal impingement at L4-5 and L5-S1. Electronically Signed   By: Van Clines M.D.   On: 03/03/2021 13:13    MR Brain W Wo Contrast  Result Date: 03/20/2021 CLINICAL DATA:  66 year old female with metastatic lung cancer. Status post whole brain radiation in 2020. Small new left temporal lobe and right frontal lobe metastases last year status post SRS. EXAM: MRI HEAD WITHOUT AND WITH CONTRAST TECHNIQUE: Multiplanar, multiecho pulse sequences of the brain and surrounding structures were obtained without and with intravenous contrast. CONTRAST:  80m MULTIHANCE GADOBENATE DIMEGLUMINE 529 MG/ML IV SOLN COMPARISON:  12/05/2020 brain MRI and earlier. FINDINGS: Brain: Enlarged area of enhancement at the treated left anterior superior temporal lobe metastasis, now 17 x 8 x 18 mm (AP by transverse by CC). See series 12, image 63). Previously this was about 7 mm diameter. The lesion itself has intermediate density T2 signal and is mildly diffusion restricted. And there is  confluent new T2 and FLAIR hyperintensity throughout the superior left temporal gyrus tracking into the anterior temporal tip and central temporal lobe white matter compatible with vasogenic edema. But mild if any associated regional mass effect. Small treated right frontal lobe metastasis remains enhancing and is 4-5 mm now on series 12, image 124, slightly larger, but without associated edema or mass effect. No other abnormal intracranial enhancement or edema. No dural thickening. No superimposed restricted diffusion suggestive of acute infarction. No midline shift, ventriculomegaly, extra-axial collection or acute intracranial hemorrhage. Cervicomedullary junction and pituitary are within normal limits. Stable cerebral volume. Chronic posterior left cerebellar encephalomalacia is stable. Relatively mild periventricular and other scattered T2 and FLAIR hyperintense foci are stable. Vascular: Major intracranial vascular flow voids are stable. On series 13 the major dural venous sinuses are enhancing and appear to be patent. Skull and upper cervical spine:  Prior cervical ACDF. Degenerative ligamentous hypertrophy about the odontoid. Otherwise negative visible cervical spine. Visible bone marrow signal is stable and within normal limits. Sinuses/Orbits: Stable, negative. Other: Ongoing questionable asymmetric hyperenhancement of the left facial nerve anterior genu, but other visible internal auditory structures appear stable and negative. Negative visible scalp and face. IMPRESSION: 1. Enlarging enhancement at the treated left temporal lobe metastasis, now 17 x 8 x 18 mm, with confluent new regional vasogenic edema but no significant mass effect. Favor Radiation Necrosis over true progression at this time. 2. Slightly larger but overall satisfactory appearance of the small treated right frontal lobe metastasis. 3. Stable brain elsewhere.  No new metastatic disease identified. Electronically Signed   By: Genevie Ann M.D.   On: 03/20/2021 12:38   XR Shoulder Left  Result Date: 03/02/2021 AP axillary outlet radiographs left shoulder reviewed.  No acute fracture.  No significant glenohumeral joint arthritis or AC joint arthritis.  Acromiohumeral distance maintained.  No structural bony abnormalities.  Visualized lung fields without obvious parenchymal lesions.    ASSESSMENT/PLAN:  This is a very pleasant 66 year old African-American female with stage IV (T1c, N1, M1c) non-small cell lung cancer, adenocarcinoma. She presented with a dominant left upper lobe lung nodule in addition to a right upper lobe pulmonary nodule with left hilar lymphadenopathy. She also has metastatic disease to the brain. She has no actionable mutations and her PDL1 expression is 1%. She was diagnosed in July 2020. She also has KRASG12C which is a targetable mutation that can be used in the second line setting.    The patient completed SBRT to the metastatic brain lesion under the care of Dr. Isidore Moos in August 2020. She also had SRS again on 09/02/20 and 12/16/20.    The patient is currently  undergoing systemic chemotherapy with carboplatin for an AUC of 5, Alimta 500 mg/m, and Keytruda 200 mg IV every 3 weeks.  She is status post 43 cycles of treatment.  Starting from cycle #5, she has been on maintenance Alimta 500 mg/m2 and Keytruda 200 mg IV. She tolerated her treatment well without any concerning adverse side effects. Alimta was discontinued due to renal insufficiency.   Labs were reviewed. Her creatinine is 1.65 today. She has baseline CKD and follows closely with nephrology. Recommend that she proceed with cycle #44 today.    We will see her back for a follow up visit in 3 weeks for evaluation and to review her scans before starting cycle #45   She will have the MRI of the shoulder to evaluate her shoulder pain and follow up with her orthopedic provider there  on after.   She was given the number to Dr. Windy Carina office to reschedule her missed appointment to review her brain MRI.   She is wondering if she can have a copy of all of her 2022 appointments for tax purposes. We will give her the number to the billing department to see if they are able to help her with the records she requires.   The patient was advised to call immediately if she has any concerning symptoms in the interval. The patient voices understanding of current disease status and treatment options and is in agreement with the current care plan. All questions were answered. The patient knows to call the clinic with any problems, questions or concerns. We can certainly see the patient much sooner if necessary      No orders of the defined types were placed in this encounter.    The total time spent in the appointment was 20-29 minutes.   Akash Winski L Scottie Stanish, PA-C 03/25/21

## 2021-03-24 ENCOUNTER — Encounter (HOSPITAL_COMMUNITY): Payer: Self-pay | Admitting: *Deleted

## 2021-03-24 ENCOUNTER — Ambulatory Visit (HOSPITAL_COMMUNITY)
Admission: EM | Admit: 2021-03-24 | Discharge: 2021-03-24 | Disposition: A | Discharge status: Home / Self Care | Attending: Family Medicine | Admitting: Family Medicine

## 2021-03-24 ENCOUNTER — Other Ambulatory Visit: Payer: Self-pay

## 2021-03-24 ENCOUNTER — Encounter: Payer: Self-pay | Admitting: Internal Medicine

## 2021-03-24 ENCOUNTER — Telehealth: Payer: Self-pay

## 2021-03-24 DIAGNOSIS — M25512 Pain in left shoulder: Secondary | ICD-10-CM | POA: Diagnosis not present

## 2021-03-24 MED ORDER — HYDROCODONE-ACETAMINOPHEN 7.5-325 MG PO TABS: 1.0000 | ORAL_TABLET | Freq: Four times a day (QID) | ORAL | 0 refills | Status: DC | PRN

## 2021-03-24 NOTE — ED Provider Notes (Signed)
[facility]    CSN: [id number] Arrival date & time: 03/24/21  8416      History   Chief Complaint Chief Complaint  Patient presents with   Shoulder Pain   Arm Injury    HPI Carolyn [name] is a 66 y.o. female.    Shoulder Pain Arm Injury Here for worsened left shoulder pain in the last 2 days.  Around mid December she began having some left shoulder pain that is coming gone, and she did end up seeing her orthopedic doctor.  He plans for an MRI for this Friday.  X-ray was negative.  She has had rotator cuff shoulder surgery on her right shoulder previously.  No recent fall or injury.  This pain worsened 2 days ago when she was swinging her arms and she felt a pop.  Her renal function is reduced, and she states that no narcotic pills usually help her.  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   Hypertension    nscl ca dx'd 08/07/18   Persistent headaches    h/o   Pneumonia    hx of PNA   Shortness of breath    Vaginal dryness    h/o    Patient Active Problem List   Diagnosis Date Noted   Elevated serum creatinine 04/12/2019   Pyelonephritis 02/08/2019   Port-A-Cath in place 10/19/2018   Encounter for antineoplastic chemotherapy 09/28/2018   Encounter for antineoplastic immunotherapy 09/28/2018   Goals of care, counseling/discussion 09/28/2018   Adenocarcinoma, lung ([place]) 09/12/2018   Brain metastases ([place]) 08/30/2018   Mass of upper lobe of left lung 08/17/2018   Angina pectoris ([place]) 06/14/2012   CAD (coronary artery disease), native coronary artery 06/14/2012   S/P  PTCA (percutaneous transluminal coronary angioplasty) 06/14/2012   Hyperlipidemia 06/14/2012   Essential hypertension 06/14/2012   Pre-diabetes 06/14/2012    Past Surgical History:  Procedure Laterality Date   ABDOMINAL HYSTERECTOMY     bone spur     CARDIAC CATHETERIZATION  06/13/2012   carpel tunnel surgery Left    COLONOSCOPY     9 years ago   CORONARY ANGIOPLASTY  06/13/2012   EYE SURGERY     cyst left eye    IR IMAGING GUIDED PORT INSERTION  10/10/2018   LEFT HEART CATHETERIZATION WITH CORONARY ANGIOGRAM N/A 06/13/2012   Procedure: LEFT HEART CATHETERIZATION WITH CORONARY ANGIOGRAM;  Surgeon: Laverda Page, MD;  Location: [facility] CATH LAB;  Service: Cardiovascular;  Laterality: N/A;   NECK SURGERY     rotator cuff surgery Right 2015   VIDEO BRONCHOSCOPY WITH ENDOBRONCHIAL NAVIGATION N/A 09/06/2018   Procedure: VIDEO BRONCHOSCOPY WITH ENDOBRONCHIAL NAVIGATION, left upper lung;  Surgeon: Lajuana Matte, MD;  Location: [facility];  Service: Thoracic;  Laterality: N/A;   VIDEO BRONCHOSCOPY WITH ENDOBRONCHIAL ULTRASOUND Left 09/06/2018   Procedure: VIDEO BRONCHOSCOPY WITH ENDOBRONCHIAL ULTRASOUND, left lung;  Surgeon: Lajuana Matte, MD;  Location: [facility];  Service: Thoracic;  Laterality: Left;   WISDOM TOOTH EXTRACTION      OB History     Gravida  3   Para      Term      Preterm      AB      Living  3      SAB      IAB      Ectopic      Multiple      Live Births               Home Medications    Prior to Admission medications   Medication Sig Start Date End Date Taking? Authorizing Provider  acetaminophen (TYLENOL) 500 MG tablet Take 1,000 mg by mouth every 8 (eight) hours as needed for mild pain, fever or headache.     [provider]  amLODipine (NORVASC) 10 MG tablet Take 1 tablet by mouth once daily 03/09/21   Adrian Prows, MD  aspirin EC 81 MG tablet Take 81 mg by mouth daily.    [provider]  carvedilol (COREG) 25 MG tablet  Take 1/2 (one-half) tablet by mouth twice daily 12/12/20   Adrian Prows, MD  Cholecalciferol (VITAMIN D-3) 125 MCG (5000 UT) TABS Take 5,000 Units by mouth daily.    [provider]  Chromium Picolinate 1000 MCG TABS Take 1,000 mg by mouth daily.    [provider]  empagliflozin (JARDIANCE) 25 MG TABS tablet Take 1 tablet (25 mg total) by mouth daily before breakfast. 10/16/20   Adrian Prows, MD  esomeprazole (NEXIUM) 20 MG capsule Take 1 capsule by mouth daily.    [provider]  ezetimibe (ZETIA) 10 MG tablet TAKE 1 TABLET BY MOUTH ONCE DAILY AFTER SUPPER 11/13/20   Adrian Prows, MD  fexofenadine (ALLEGRA) 180 MG tablet 1 tablet swallow whole with water; do not take with fruit juices. 02/20/20   [provider]  HYDROcodone-acetaminophen (NORCO) 7.5-325 MG tablet Take 1 tablet by mouth every 6 (six) hours as needed for moderate pain. 03/24/21   Barrett Henle, MD  Lancets [facility] DELICA PLUS [id number]) [place] SMARTSIG:1 Topical 3 Times Daily 08/01/19   [provider]  lidocaine-prilocaine (EMLA) cream Apply to the Port-A-Cath site 30 to 60 minutes before chemotherapy. 09/27/19   Heilingoetter, Cassandra L, PA-C  metFORMIN (GLUCOPHAGE) 500 MG tablet Take 500 mg by mouth daily after breakfast.    [provider]  methocarbamol (ROBAXIN) 500 MG tablet Take 1 tablet (500 mg total) by mouth every 6 (six) hours as needed for muscle spasms. 03/25/17   Lily Kocher, PA-C  nitroGLYCERIN (NITROSTAT) 0.4 MG SL tablet Place 1 tablet (0.4 mg total) under the tongue every 5 (five) minutes as needed for chest pain. 03/09/19   Adrian Prows, MD  [facility] ULTRA test strip USE 1 STRIP TO [place] DAILY 08/10/18   [provider]  Pitavastatin Calcium (LIVALO) 4 MG TABS Take 1 tablet (4 mg total) by mouth at bedtime. 03/13/21   Adrian Prows, MD  telmisartan (MICARDIS) 40 MG tablet Take 1 tablet by mouth daily.    [provider]    Family  History Family History  Problem Relation Age of Onset   Breast cancer Other    Diabetes Mother    Glaucoma Mother    Hypertension Mother    Hypertension Father    Asthma Father    Diabetes Sister    Diabetes Brother    Hypertension Sister  Social History Social History   Tobacco Use   Smoking status: Former    Packs/day: 0.50    Years: 30.00    Pack years: 15.00    Types: Cigarettes    Quit date: 02/09/1999    Years since quitting: 22.1   Smokeless tobacco: Never  Vaping Use   Vaping Use: Never used  Substance Use Topics   Alcohol use: Not Currently   Drug use: Not Currently     Allergies   Atorvastatin, Crestor [rosuvastatin], and Lisinopril   Review of Systems Review of Systems   Physical Exam Triage Vital Signs ED Triage Vitals  Enc Vitals Group     BP 03/24/21 1048 (!) 141/84     Pulse Rate 03/24/21 1048 98     Resp 03/24/21 1048 18     Temp 03/24/21 1048 97.8 F (36.6 C)     Temp src --      SpO2 03/24/21 1048 99 %     Weight --      Height --      Head Circumference --      Peak Flow --      Pain Score 03/24/21 1044 9     Pain Loc --      Pain Edu? --      Excl. in [place]? --    No data found.  Updated Vital Signs BP (!) 141/84    Pulse 98    Temp 97.8 F (36.6 C)    Resp 18    SpO2 99%   Visual Acuity Right Eye Distance:   Left Eye Distance:   Bilateral Distance:    Right Eye Near:   Left Eye Near:    Bilateral Near:     Physical Exam Vitals reviewed.  Constitutional:      General: She is not in acute distress.    Appearance: She is not toxic-appearing.  Cardiovascular:     Rate and Rhythm: Normal rate and regular rhythm.     Heart sounds: No murmur heard. Pulmonary:     Effort: Pulmonary effort is normal.     Breath sounds: Normal breath sounds.  Musculoskeletal:        General: Tenderness (anterior shoulder) present.  Skin:    Coloration: Skin is not jaundiced or pale.  Neurological:     Mental Status: She is alert and  oriented to person, place, and time.  Psychiatric:        Behavior: Behavior normal.     UC Treatments / Results  Labs (all labs ordered are listed, but only abnormal results are displayed) Labs Reviewed - No data to display  EKG   Radiology No results found.  Procedures Procedures (including critical care time)  Medications Ordered in UC Medications - No data to display  Initial Impression / Assessment and Plan / UC Course  I have reviewed the triage vital signs and the nursing notes.  Pertinent labs & imaging results that were available during my care of the patient were reviewed by me and considered in my medical decision making (see chart for details).     Discussed that we cannot provide anti-inflammatory prescriptions with her reduced renal function.  She will take Tylenol for the most part, but have also sent in some hydrocodone to help her if she needs when pain is worse.  No narcotics in the last 2 years on PMP Final Clinical Impressions(s) / UC Diagnoses   Final diagnoses:  Left shoulder pain, unspecified chronicity  Discharge Instructions      You can take Tylenol 500 mg, 2 every 4 hours as needed for pain  Prescription will be sent for hydrocodone with Tylenol, and you can take that 1 every 4 hours as needed for pain when it is more severe.  This medication may cause sleepiness     ED Prescriptions     Medication Sig Dispense Auth. Provider   HYDROcodone-acetaminophen (NORCO) 7.5-325 MG tablet  (Status: Discontinued) Take 1 tablet by mouth every 6 (six) hours as needed for moderate pain. 12 tablet Mimie Goering, Gwenlyn Perking, MD   HYDROcodone-acetaminophen (NORCO) 7.5-325 MG tablet Take 1 tablet by mouth every 6 (six) hours as needed for moderate pain. 12 tablet Sam Overbeck, Gwenlyn Perking, MD      I have reviewed the PDMP during this encounter.   Barrett Henle, MD 03/24/21 1143

## 2021-03-24 NOTE — ED Triage Notes (Signed)
Pt reports ongoing Lt shoulder pain . Pt has a up coming MRI on Friday this week.  Pt reports she was at church on Sunday singing and swinging her arms. The Lt shoulder pain is now worse . Pain 9/10 scale.

## 2021-03-24 NOTE — Discharge Instructions (Signed)
You can take Tylenol 500 mg, 2 every 4 hours as needed for pain  Prescription will be sent for hydrocodone with Tylenol, and you can take that 1 every 4 hours as needed for pain when it is more severe.  This medication may cause sleepiness

## 2021-03-25 ENCOUNTER — Encounter: Payer: Self-pay | Admitting: Physician Assistant

## 2021-03-25 ENCOUNTER — Inpatient Hospital Stay (HOSPITAL_BASED_OUTPATIENT_CLINIC_OR_DEPARTMENT_OTHER): Payer: Medicare Other | Admitting: Physician Assistant

## 2021-03-25 ENCOUNTER — Inpatient Hospital Stay: Payer: Medicare Other

## 2021-03-25 VITALS — BP 144/61 | HR 98 | Temp 97.7°F | Resp 16 | Wt 182.2 lb

## 2021-03-25 DIAGNOSIS — C3491 Malignant neoplasm of unspecified part of right bronchus or lung: Secondary | ICD-10-CM | POA: Diagnosis not present

## 2021-03-25 DIAGNOSIS — C3412 Malignant neoplasm of upper lobe, left bronchus or lung: Secondary | ICD-10-CM | POA: Diagnosis not present

## 2021-03-25 DIAGNOSIS — C3492 Malignant neoplasm of unspecified part of left bronchus or lung: Secondary | ICD-10-CM

## 2021-03-25 DIAGNOSIS — R5383 Other fatigue: Secondary | ICD-10-CM

## 2021-03-25 DIAGNOSIS — Z79899 Other long term (current) drug therapy: Secondary | ICD-10-CM | POA: Diagnosis not present

## 2021-03-25 DIAGNOSIS — C7931 Secondary malignant neoplasm of brain: Secondary | ICD-10-CM | POA: Diagnosis not present

## 2021-03-25 DIAGNOSIS — Z95828 Presence of other vascular implants and grafts: Secondary | ICD-10-CM

## 2021-03-25 DIAGNOSIS — Z5112 Encounter for antineoplastic immunotherapy: Secondary | ICD-10-CM

## 2021-03-25 LAB — CBC WITH DIFFERENTIAL (CANCER CENTER ONLY)
Abs Immature Granulocytes: 0.09 10*3/uL — ABNORMAL HIGH (ref 0.00–0.07)
Basophils Absolute: 0 10*3/uL (ref 0.0–0.1)
Basophils Relative: 0 %
Eosinophils Absolute: 0.2 10*3/uL (ref 0.0–0.5)
Eosinophils Relative: 1 %
HCT: 39.6 % (ref 36.0–46.0)
Hemoglobin: 12 g/dL (ref 12.0–15.0)
Immature Granulocytes: 1 %
Lymphocytes Relative: 14 %
Lymphs Abs: 1.7 10*3/uL (ref 0.7–4.0)
MCH: 24 pg — ABNORMAL LOW (ref 26.0–34.0)
MCHC: 30.3 g/dL (ref 30.0–36.0)
MCV: 79.2 fL — ABNORMAL LOW (ref 80.0–100.0)
Monocytes Absolute: 0.8 10*3/uL (ref 0.1–1.0)
Monocytes Relative: 6 %
Neutro Abs: 10 10*3/uL — ABNORMAL HIGH (ref 1.7–7.7)
Neutrophils Relative %: 78 %
Platelet Count: 287 10*3/uL (ref 150–400)
RBC: 5 MIL/uL (ref 3.87–5.11)
RDW: 18.9 % — ABNORMAL HIGH (ref 11.5–15.5)
WBC Count: 12.8 10*3/uL — ABNORMAL HIGH (ref 4.0–10.5)
nRBC: 0 % (ref 0.0–0.2)

## 2021-03-25 LAB — CMP (CANCER CENTER ONLY)
ALT: 28 U/L (ref 0–44)
AST: 30 U/L (ref 15–41)
Albumin: 3.3 g/dL — ABNORMAL LOW (ref 3.5–5.0)
Alkaline Phosphatase: 58 U/L (ref 38–126)
Anion gap: 11 (ref 5–15)
BUN: 23 mg/dL (ref 8–23)
CO2: 23 mmol/L (ref 22–32)
Calcium: 8.5 mg/dL — ABNORMAL LOW (ref 8.9–10.3)
Chloride: 100 mmol/L (ref 98–111)
Creatinine: 1.65 mg/dL — ABNORMAL HIGH (ref 0.44–1.00)
GFR, Estimated: 34 mL/min — ABNORMAL LOW (ref >60)
Glucose, Bld: 165 mg/dL — ABNORMAL HIGH (ref 70–99)
Potassium: 4.3 mmol/L (ref 3.5–5.1)
Sodium: 134 mmol/L — ABNORMAL LOW (ref 135–145)
Total Bilirubin: 0.4 mg/dL (ref 0.3–1.2)
Total Protein: 6.4 g/dL — ABNORMAL LOW (ref 6.5–8.1)

## 2021-03-25 LAB — TSH: TSH: 1.498 u[IU]/mL (ref 0.308–3.960)

## 2021-03-25 MED ORDER — SODIUM CHLORIDE 0.9 % IV SOLN: Freq: Once | INTRAVENOUS | Status: AC

## 2021-03-25 MED ORDER — HEPARIN SOD (PORK) LOCK FLUSH 100 UNIT/ML IV SOLN
500.0000 [IU] | Freq: Once | INTRAVENOUS | Status: AC | PRN
  Administered 2021-03-25: 500 [IU]

## 2021-03-25 MED ORDER — SODIUM CHLORIDE 0.9% FLUSH
10.0000 mL | INTRAVENOUS | Status: DC | PRN
  Administered 2021-03-25: 10 mL

## 2021-03-25 MED ORDER — SODIUM CHLORIDE 0.9 % IV SOLN: Freq: Once | INTRAVENOUS | Status: DC

## 2021-03-25 MED ORDER — SODIUM CHLORIDE 0.9 % IV SOLN
200.0000 mg | Freq: Once | INTRAVENOUS | Status: AC
  Administered 2021-03-25: 200 mg via INTRAVENOUS
  Filled 2021-03-25: qty 200

## 2021-03-25 NOTE — Progress Notes (Signed)
Per Cassie PA, ok to treat today with ANC of 10 and SCR 1.65

## 2021-03-25 NOTE — Patient Instructions (Signed)
[facility]ICAL [facility]COLOG[facility]  Discharge Instructions: ?Thank you for choosing [facility] to provide your oncology and hematology care.  ? ?If you have a lab appointment with the Cancer Center, please go directly to the Cancer Center and check in at the registration area. ?  ?Wear comfortable clothing and clothing appropriate for easy access to any Portacath or PICC line.  ? ?We strive to give you quality time with your provider. You may need to reschedule your appointment if you arrive late (15 or more minutes).  Arriving late affects you and other patients whose appointments are after yours.  Also, if you miss three or more appointments without notifying the office, you may be dismissed from the clinic at the provider?s discretion.    ?  ?For prescription refill requests, have your pharmacy contact our office and allow 72 hours for refills to be completed.   ? ?Today you received the following chemotherapy and/or immunotherapy agents: Keytruda ?  ?To help prevent nausea and vomiting after your treatment, we encourage you to take your nausea medication as directed. ? ?BELOW ARE SYMPTOMS THAT SHOULD BE REPORTED IMMEDIATELY: ?*FEVER GREATER THAN 100.4 F (38 ?C) OR HIGHER ?*CHILLS OR SWEATING ?*NAUSEA AND VOMITING THAT IS NOT CONTROLLED WITH YOUR NAUSEA MEDICATION ?*UNUSUAL SHORTNESS OF BREATH ?*UNUSUAL BRUISING OR BLEEDING ?*URINARY PROBLEMS (pain or burning when urinating, or frequent urination) ?*BOWEL PROBLEMS (unusual diarrhea, constipation, pain near the anus) ?TENDERNESS IN MOUTH AND THROAT WITH OR WITHOUT PRESENCE OF ULCERS (sore throat, sores in mouth, or a toothache) ?UNUSUAL RASH, SWELLING OR PAIN  ?UNUSUAL VAGINAL DISCHARGE OR ITCHING  ? ?Items with * indicate a potential emergency and should be followed up as soon as possible or go to the Emergency Department if any problems should occur. ? ?Please show the CHEMOTHERAPY ALERT CARD or IMMUNOTHERAPY ALERT CARD at check-in to the  Emergency Department and triage nurse. ? ?Should you have questions after your visit or need to cancel or reschedule your appointment, please contact [facility]  Dept: [phone]  and follow the prompts.  Office hours are 8:00 a.m. to 4:30 p.m. Monday - Friday. Please note that voicemails left after 4:00 p.m. may not be returned until the following business day.  We are closed weekends and major holidays. You have access to a nurse at all times for urgent questions. Please call the main number to the clinic Dept: [phone] and follow the prompts. ? ? ?For any non-urgent questions, you may also contact your provider using MyChart. We now offer e-Visits for anyone 18 and older to request care online for non-urgent symptoms. For details visit [email or web address]. ?  ?Also download the MyChart app! Go to the app store, search "MyChart", open the app, select [facility], and log in with your MyChart username and password. ? ?Due to Covid, a mask is required upon entering the hospital/clinic. If you do not have a mask, one will be given to you upon arrival. For doctor visits, patients may have 1 support person aged 18 or older with them. For treatment visits, patients cannot have anyone with them due to current Covid guidelines and our immunocompromised population.  ? ?

## 2021-03-27 ENCOUNTER — Ambulatory Visit
Admission: RE | Admit: 2021-03-27 | Discharge: 2021-03-27 | Disposition: A | Discharge status: Home / Self Care | Payer: Medicare Other | Source: Ambulatory Visit | Attending: Orthopedic Surgery | Admitting: Orthopedic Surgery

## 2021-03-27 DIAGNOSIS — M25512 Pain in left shoulder: Secondary | ICD-10-CM

## 2021-03-27 DIAGNOSIS — S46012A Strain of muscle(s) and tendon(s) of the rotator cuff of left shoulder, initial encounter: Secondary | ICD-10-CM | POA: Diagnosis not present

## 2021-03-27 MED ORDER — IOPAMIDOL (ISOVUE-M 200) INJECTION 41%
13.0000 mL | Freq: Once | INTRAMUSCULAR | Status: AC
  Administered 2021-03-27: 13 mL via INTRA_ARTICULAR

## 2021-03-31 DIAGNOSIS — Z20822 Contact with and (suspected) exposure to covid-19: Secondary | ICD-10-CM | POA: Diagnosis not present

## 2021-04-01 ENCOUNTER — Ambulatory Visit (INDEPENDENT_AMBULATORY_CARE_PROVIDER_SITE_OTHER): Payer: Medicare Other | Admitting: Orthopedic Surgery

## 2021-04-01 ENCOUNTER — Other Ambulatory Visit: Payer: Self-pay

## 2021-04-01 DIAGNOSIS — M25512 Pain in left shoulder: Secondary | ICD-10-CM

## 2021-04-02 DIAGNOSIS — M75100 Unspecified rotator cuff tear or rupture of unspecified shoulder, not specified as traumatic: Secondary | ICD-10-CM | POA: Diagnosis not present

## 2021-04-02 DIAGNOSIS — M2550 Pain in unspecified joint: Secondary | ICD-10-CM | POA: Diagnosis not present

## 2021-04-02 DIAGNOSIS — M7989 Other specified soft tissue disorders: Secondary | ICD-10-CM | POA: Diagnosis not present

## 2021-04-02 DIAGNOSIS — M1990 Unspecified osteoarthritis, unspecified site: Secondary | ICD-10-CM | POA: Diagnosis not present

## 2021-04-02 DIAGNOSIS — C3490 Malignant neoplasm of unspecified part of unspecified bronchus or lung: Secondary | ICD-10-CM | POA: Diagnosis not present

## 2021-04-02 DIAGNOSIS — M79642 Pain in left hand: Secondary | ICD-10-CM | POA: Diagnosis not present

## 2021-04-02 DIAGNOSIS — R768 Other specified abnormal immunological findings in serum: Secondary | ICD-10-CM | POA: Diagnosis not present

## 2021-04-02 DIAGNOSIS — N1831 Chronic kidney disease, stage 3a: Secondary | ICD-10-CM | POA: Diagnosis not present

## 2021-04-02 DIAGNOSIS — M549 Dorsalgia, unspecified: Secondary | ICD-10-CM | POA: Diagnosis not present

## 2021-04-03 ENCOUNTER — Encounter: Payer: Self-pay | Admitting: Orthopedic Surgery

## 2021-04-03 NOTE — Progress Notes (Signed)
Office Visit Note   Patient: Carolyn [name]           Date of Birth: 23-Jun-1955           MRN: [id number] Visit Date: 04/01/2021 Requested by: [name], [address],  [place] [id number] PCP: [name], FNP  Subjective: Chief Complaint  Patient presents with   Other     Scan review    HPI: Patient presents for follow-up of left shoulder pain.  Since she was last seen she has had an MRI scan.  She was doing swinging of the arm at church and she felt a pop in her shoulder.  Had some immediate onset of pain and weakness but that has improved about 85%.  Had injection in December.  Her husband is having neck surgery in the near future.  MRI scan is reviewed and shows full-thickness full width tears of the supraspinatus and infraspinatus tendons with up to 2.5 cm of retraction.  Teres minor and subscap intact.  Currently she is functional with the left shoulder.  She does have a history of metastatic lung cancer and is on chemotherapy.              ROS: All systems reviewed are negative as they relate to the chief complaint within the history of present illness.  Patient denies  fevers or chills.   Assessment & Plan: Visit Diagnoses:  1. Left shoulder pain, unspecified chronicity     Plan: Impression is left shoulder pain with rotator cuff tears and retraction.  Currently her shoulder is functional.  We talked today about operative and nonoperative treatment options as well as the rehabilitation involved with rotator cuff repair.  A lot of complicating factors involved in this particular clinical decision.  In general the shoulder is getting better on its own.  I think the rotator cuff tears will likely remain repairable for several months.  She is going to try 4 weeks of physical therapy on her own.  If she wants to go with formal physical therapy she will call in 4 weeks and we can make that happen at that time.  8-week return for clinical  recheck and decision for or against any type of intervention at that time.  Follow-Up Instructions: Return in about 8 weeks (around 05/27/2021).   Orders:  No orders of the defined types were placed in this encounter.  No orders of the defined types were placed in this encounter.     Procedures: No procedures performed   Clinical Data: No additional findings.  Objective: Vital Signs: There were no vitals taken for this visit.  Physical Exam:   Constitutional: Patient appears well-developed HEENT:  Head: Normocephalic Eyes:EOM are normal Neck: Normal range of motion Cardiovascular: Normal rate Pulmonary/chest: Effort normal Neurologic: Patient is alert Skin: Skin is warm Psychiatric: Patient has normal mood and affect   Ortho Exam: Ortho exam demonstrates ability to get the left hand behind her head.  She does have some weakness with infraspinatus supraspinatus strength testing.  Subscap strength testing is 5+ out of 5.  Some coarseness with internal and external rotation of that left arm but not as much as you would expect based on the MRI scanning.  Specialty Comments:  No specialty comments available.  Imaging: No results found.   PMFS History: Patient Active Problem List   Diagnosis Date Noted   Elevated serum creatinine 04/12/2019   Pyelonephritis 02/08/2019   Port-A-Cath in place  10/19/2018   Encounter for antineoplastic chemotherapy 09/28/2018   Encounter for antineoplastic immunotherapy 09/28/2018   Goals of care, counseling/discussion 09/28/2018   Adenocarcinoma, lung ([place]) 09/12/2018   Brain metastases ([place]) 08/30/2018   Mass of upper lobe of left lung 08/17/2018   Angina pectoris ([place]) 06/14/2012   CAD (coronary artery disease), native coronary artery 06/14/2012   S/P PTCA (percutaneous transluminal coronary angioplasty) 06/14/2012   Hyperlipidemia 06/14/2012   Essential hypertension 06/14/2012   Pre-diabetes 06/14/2012   Past Medical History:   Diagnosis Date   Anginal pain ([place])    " with exertion "   Arthritis    " 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 "   Asthma    h/o   Brain metastases ([place]) 08/2018   Coronary artery disease    Diabetes mellitus without complication (HCC)    Type II   GERD (gastroesophageal reflux disease)    H/O hiatal hernia    Heart murmur    History of radiation therapy 09/22/2018   stereotactic radiosurgery    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   Hypertension    nscl ca dx'd 08/07/18   Persistent headaches    h/o   Pneumonia    hx of PNA   Shortness of breath    Vaginal dryness    h/o    Family History  Problem Relation Age of Onset   Breast cancer Other    Diabetes Mother    Glaucoma Mother    Hypertension Mother    Hypertension Father    Asthma Father    Diabetes Sister    Diabetes Brother    Hypertension Sister     Past Surgical History:  Procedure Laterality Date   ABDOMINAL HYSTERECTOMY     bone spur     CARDIAC CATHETERIZATION  06/13/2012   carpel tunnel surgery Left    COLONOSCOPY     9 years ago   CORONARY ANGIOPLASTY  06/13/2012   EYE SURGERY     cyst left eye    IR IMAGING GUIDED PORT INSERTION  10/10/2018   LEFT HEART CATHETERIZATION WITH CORONARY ANGIOGRAM N/A 06/13/2012   Procedure: LEFT HEART CATHETERIZATION WITH CORONARY ANGIOGRAM;  Surgeon: Laverda Page, MD;  Location: [facility] CATH LAB;  Service: Cardiovascular;  Laterality: N/A;   NECK SURGERY     rotator cuff surgery Right 2015   VIDEO BRONCHOSCOPY WITH ENDOBRONCHIAL NAVIGATION N/A 09/06/2018   Procedure: VIDEO BRONCHOSCOPY WITH ENDOBRONCHIAL NAVIGATION, left upper lung;  Surgeon: Lajuana Matte, MD;  Location: [facility] OR;  Service: Thoracic;  Laterality: N/A;   VIDEO BRONCHOSCOPY WITH ENDOBRONCHIAL ULTRASOUND Left 09/06/2018   Procedure: VIDEO BRONCHOSCOPY WITH ENDOBRONCHIAL ULTRASOUND, left lung;  Surgeon: Lajuana Matte, MD;  Location: [facility] OR;  Service: Thoracic;  Laterality: Left;   WISDOM TOOTH EXTRACTION     Social History    Occupational History   Not on file  Tobacco Use   Smoking status: Former    Packs/day: 0.50    Years: 30.00    Pack years: 15.00    Types: Cigarettes    Quit date: 02/09/1999    Years since quitting: 22.1   Smokeless tobacco: Never  Vaping Use   Vaping Use: Never used  Substance and Sexual Activity   Alcohol use: Not Currently   Drug use: Not Currently   Sexual activity: Yes    Birth control/protection: Post-menopausal

## 2021-04-07 DIAGNOSIS — Z6835 Body mass index (BMI) 35.0-35.9, adult: Secondary | ICD-10-CM | POA: Diagnosis not present

## 2021-04-07 DIAGNOSIS — C7931 Secondary malignant neoplasm of brain: Secondary | ICD-10-CM | POA: Diagnosis not present

## 2021-04-07 DIAGNOSIS — E559 Vitamin D deficiency, unspecified: Secondary | ICD-10-CM | POA: Diagnosis not present

## 2021-04-07 DIAGNOSIS — I10 Essential (primary) hypertension: Secondary | ICD-10-CM | POA: Diagnosis not present

## 2021-04-07 DIAGNOSIS — Z20822 Contact with and (suspected) exposure to covid-19: Secondary | ICD-10-CM | POA: Diagnosis not present

## 2021-04-07 DIAGNOSIS — E7801 Familial hypercholesterolemia: Secondary | ICD-10-CM | POA: Diagnosis not present

## 2021-04-07 DIAGNOSIS — E1165 Type 2 diabetes mellitus with hyperglycemia: Secondary | ICD-10-CM | POA: Diagnosis not present

## 2021-04-07 DIAGNOSIS — Z0000 Encounter for general adult medical examination without abnormal findings: Secondary | ICD-10-CM | POA: Diagnosis not present

## 2021-04-08 ENCOUNTER — Other Ambulatory Visit: Payer: Self-pay | Admitting: Radiation Oncology

## 2021-04-08 ENCOUNTER — Other Ambulatory Visit: Payer: Self-pay | Admitting: Radiation Therapy

## 2021-04-08 ENCOUNTER — Telehealth: Payer: Self-pay | Admitting: Radiation Therapy

## 2021-04-08 DIAGNOSIS — C7931 Secondary malignant neoplasm of brain: Secondary | ICD-10-CM

## 2021-04-08 MED ORDER — VITAMIN E 180 MG (400 UNIT) PO CAPS: ORAL_CAPSULE | ORAL | 5 refills | Status: DC

## 2021-04-08 MED ORDER — PENTOXIFYLLINE ER 400 MG PO TBCR: EXTENDED_RELEASE_TABLET | ORAL | 5 refills | Status: DC

## 2021-04-08 NOTE — Telephone Encounter (Signed)
Called to let [name] know that the Rx for Trental and Vit E has been sent by Dr. Isidore Moos to the [facility] in [place]. She was thankful for the call and plans to pick up later today.  ? ?[name] R.T.(R)(T) ?Radiation Special Procedures Navigator  ?

## 2021-04-09 DIAGNOSIS — C3492 Malignant neoplasm of unspecified part of left bronchus or lung: Secondary | ICD-10-CM | POA: Diagnosis not present

## 2021-04-09 DIAGNOSIS — I10 Essential (primary) hypertension: Secondary | ICD-10-CM | POA: Diagnosis not present

## 2021-04-09 DIAGNOSIS — N183 Chronic kidney disease, stage 3 unspecified: Secondary | ICD-10-CM | POA: Diagnosis not present

## 2021-04-09 DIAGNOSIS — E119 Type 2 diabetes mellitus without complications: Secondary | ICD-10-CM | POA: Diagnosis not present

## 2021-04-09 DIAGNOSIS — E559 Vitamin D deficiency, unspecified: Secondary | ICD-10-CM | POA: Diagnosis not present

## 2021-04-09 DIAGNOSIS — I2510 Atherosclerotic heart disease of native coronary artery without angina pectoris: Secondary | ICD-10-CM | POA: Diagnosis not present

## 2021-04-09 DIAGNOSIS — Z0000 Encounter for general adult medical examination without abnormal findings: Secondary | ICD-10-CM | POA: Diagnosis not present

## 2021-04-09 DIAGNOSIS — E785 Hyperlipidemia, unspecified: Secondary | ICD-10-CM | POA: Diagnosis not present

## 2021-04-10 NOTE — Progress Notes (Signed)
Labs 04/07/2021: ? ?A1c 6.8%.  Vitamin D67.8. ? ?Hb 12.7/HCT 40.9, platelets 260. ? ?BUN 18, creatinine 1.79, potassium 4.8, EGFR 29/33.  Uric acid normal at 5.5.  CRP 0.41. ? ?Urine analysis: Urine glucose >thousand.  Urinary protein negative. ? ?Total cholesterol 143, triglycerides 64, HDL 70, LDL 60.

## 2021-04-15 ENCOUNTER — Other Ambulatory Visit: Payer: Self-pay

## 2021-04-15 ENCOUNTER — Inpatient Hospital Stay: Payer: Medicare Other | Attending: Physician Assistant

## 2021-04-15 ENCOUNTER — Encounter: Payer: Self-pay | Admitting: Internal Medicine

## 2021-04-15 ENCOUNTER — Inpatient Hospital Stay: Payer: Medicare Other

## 2021-04-15 ENCOUNTER — Inpatient Hospital Stay (HOSPITAL_BASED_OUTPATIENT_CLINIC_OR_DEPARTMENT_OTHER): Payer: Medicare Other | Admitting: Internal Medicine

## 2021-04-15 VITALS — BP 137/70 | HR 60 | Temp 97.1°F | Resp 19 | Ht 60.5 in | Wt 187.1 lb

## 2021-04-15 DIAGNOSIS — C3491 Malignant neoplasm of unspecified part of right bronchus or lung: Secondary | ICD-10-CM

## 2021-04-15 DIAGNOSIS — C3492 Malignant neoplasm of unspecified part of left bronchus or lung: Secondary | ICD-10-CM

## 2021-04-15 DIAGNOSIS — C3412 Malignant neoplasm of upper lobe, left bronchus or lung: Secondary | ICD-10-CM | POA: Insufficient documentation

## 2021-04-15 DIAGNOSIS — C7931 Secondary malignant neoplasm of brain: Secondary | ICD-10-CM | POA: Diagnosis not present

## 2021-04-15 DIAGNOSIS — Z5112 Encounter for antineoplastic immunotherapy: Secondary | ICD-10-CM | POA: Insufficient documentation

## 2021-04-15 DIAGNOSIS — R5383 Other fatigue: Secondary | ICD-10-CM

## 2021-04-15 DIAGNOSIS — Z79899 Other long term (current) drug therapy: Secondary | ICD-10-CM | POA: Diagnosis not present

## 2021-04-15 DIAGNOSIS — Z95828 Presence of other vascular implants and grafts: Secondary | ICD-10-CM

## 2021-04-15 LAB — CBC WITH DIFFERENTIAL (CANCER CENTER ONLY)
Abs Immature Granulocytes: 0.03 10*3/uL (ref 0.00–0.07)
Basophils Absolute: 0 10*3/uL (ref 0.0–0.1)
Basophils Relative: 1 %
Eosinophils Absolute: 0.3 10*3/uL (ref 0.0–0.5)
Eosinophils Relative: 4 %
HCT: 38.5 % (ref 36.0–46.0)
Hemoglobin: 11.7 g/dL — ABNORMAL LOW (ref 12.0–15.0)
Immature Granulocytes: 1 %
Lymphocytes Relative: 19 %
Lymphs Abs: 1.3 10*3/uL (ref 0.7–4.0)
MCH: 24.1 pg — ABNORMAL LOW (ref 26.0–34.0)
MCHC: 30.4 g/dL (ref 30.0–36.0)
MCV: 79.4 fL — ABNORMAL LOW (ref 80.0–100.0)
Monocytes Absolute: 0.6 10*3/uL (ref 0.1–1.0)
Monocytes Relative: 10 %
Neutro Abs: 4.4 10*3/uL (ref 1.7–7.7)
Neutrophils Relative %: 65 %
Platelet Count: 287 10*3/uL (ref 150–400)
RBC: 4.85 MIL/uL (ref 3.87–5.11)
RDW: 17.6 % — ABNORMAL HIGH (ref 11.5–15.5)
WBC Count: 6.6 10*3/uL (ref 4.0–10.5)
nRBC: 0 % (ref 0.0–0.2)

## 2021-04-15 LAB — CMP (CANCER CENTER ONLY)
ALT: 12 U/L (ref 0–44)
AST: 19 U/L (ref 15–41)
Albumin: 3.9 g/dL (ref 3.5–5.0)
Alkaline Phosphatase: 80 U/L (ref 38–126)
Anion gap: 5 (ref 5–15)
BUN: 16 mg/dL (ref 8–23)
CO2: 29 mmol/L (ref 22–32)
Calcium: 9.2 mg/dL (ref 8.9–10.3)
Chloride: 107 mmol/L (ref 98–111)
Creatinine: 1.56 mg/dL — ABNORMAL HIGH (ref 0.44–1.00)
GFR, Estimated: 36 mL/min — ABNORMAL LOW (ref >60)
Glucose, Bld: 107 mg/dL — ABNORMAL HIGH (ref 70–99)
Potassium: 4.4 mmol/L (ref 3.5–5.1)
Sodium: 141 mmol/L (ref 135–145)
Total Bilirubin: 0.3 mg/dL (ref 0.3–1.2)
Total Protein: 6.9 g/dL (ref 6.5–8.1)

## 2021-04-15 LAB — TSH: TSH: 1.777 u[IU]/mL (ref 0.308–3.960)

## 2021-04-15 MED ORDER — SODIUM CHLORIDE 0.9 % IV SOLN
200.0000 mg | Freq: Once | INTRAVENOUS | Status: AC
  Administered 2021-04-15: 200 mg via INTRAVENOUS
  Filled 2021-04-15: qty 200

## 2021-04-15 MED ORDER — SODIUM CHLORIDE 0.9% FLUSH
10.0000 mL | INTRAVENOUS | Status: DC | PRN
  Administered 2021-04-15: 10 mL

## 2021-04-15 MED ORDER — HEPARIN SOD (PORK) LOCK FLUSH 100 UNIT/ML IV SOLN
500.0000 [IU] | Freq: Once | INTRAVENOUS | Status: AC | PRN
  Administered 2021-04-15: 500 [IU]

## 2021-04-15 MED ORDER — SODIUM CHLORIDE 0.9 % IV SOLN: Freq: Once | INTRAVENOUS | Status: AC

## 2021-04-15 NOTE — Addendum Note (Signed)
Addended by: Ardeen Garland on: 04/15/2021 09:59 AM ? ? Modules accepted: Orders ? ?

## 2021-04-15 NOTE — Patient Instructions (Addendum)
[facility]   ?Discharge Instructions: ?Thank you for choosing [facility] to provide your oncology and hematology care.  ? ?If you have a lab appointment with the [facility], please go directly to the [facility] and check in at the registration area. ?  ?Wear comfortable clothing and clothing appropriate for easy access to any Portacath or PICC line.  ? ?We strive to give you quality time with your provider. You may need to reschedule your appointment if you arrive late (15 or more minutes).  Arriving late affects you and other patients whose appointments are after yours.  Also, if you miss three or more appointments without notifying the office, you may be dismissed from the clinic at the provider?s discretion.    ?  ?For prescription refill requests, have your pharmacy contact our office and allow 72 hours for refills to be completed.   ? ?Today you received the following chemotherapy and/or immunotherapy agents: Pembrolizumab Beryle Flock)    ?  ?To help prevent nausea and vomiting after your treatment, we encourage you to take your nausea medication as directed. ? ?BELOW ARE SYMPTOMS THAT SHOULD BE REPORTED IMMEDIATELY: ?*FEVER GREATER THAN 100.4 F (38 ?C) OR HIGHER ?*CHILLS OR SWEATING ?*NAUSEA AND VOMITING THAT IS NOT CONTROLLED WITH YOUR NAUSEA MEDICATION ?*UNUSUAL SHORTNESS OF BREATH ?*UNUSUAL BRUISING OR BLEEDING ?*URINARY PROBLEMS (pain or burning when urinating, or frequent urination) ?*BOWEL PROBLEMS (unusual diarrhea, constipation, pain near the anus) ?TENDERNESS IN MOUTH AND THROAT WITH OR WITHOUT PRESENCE OF ULCERS (sore throat, sores in mouth, or a toothache) ?UNUSUAL RASH, SWELLING OR PAIN  ?UNUSUAL VAGINAL DISCHARGE OR ITCHING  ? ?Items with * indicate a potential emergency and should be followed up as soon as possible or go to the Emergency Department if any problems should occur. ? ?Please show the CHEMOTHERAPY ALERT CARD or IMMUNOTHERAPY ALERT  CARD at check-in to the Emergency Department and triage nurse. ? ?Should you have questions after your visit or need to cancel or reschedule your appointment, please contact [facility]  Dept: [phone]  and follow the prompts.  Office hours are 8:00 a.m. to 4:30 p.m. Monday - Friday. Please note that voicemails left after 4:00 p.m. may not be returned until the following business day.  We are closed weekends and major holidays. You have access to a nurse at all times for urgent questions. Please call the main number to the clinic Dept: [phone] and follow the prompts. ? ? ?For any non-urgent questions, you may also contact your provider using MyChart. We now offer e-Visits for anyone 63 and older to request care online for non-urgent symptoms. For details visit [email or web address]. ?  ?Also download the MyChart app! Go to the app store, search "MyChart", open the app, select [facility], and log in with your MyChart username and password. ? ?Due to Covid, a mask is required upon entering the hospital/clinic. If you do not have a mask, one will be given to you upon arrival. For doctor visits, patients may have 1 support person aged 28 or older with them. For treatment visits, patients cannot have anyone with them due to current Covid guidelines and our immunocompromised population.  ? ?

## 2021-04-15 NOTE — Progress Notes (Signed)
[place] [phone]   Fax:([phone]  OFFICE PROGRESS NOTE  [name], [place] [address]  DIAGNOSIS: Stage IV (T1c, N1, M1c ) non-small cell lung cancer, adenocarcinoma diagnosed in July 2020 and presented with left upper lobe lung nodule in addition to right upper lobe lung nodule with left hilar lymphadenopathy as well as multiple brain metastasis.  Biomarker Findings Microsatellite status - MS-Stable Tumor Mutational Burden - 4 Muts/Mb Genomic Findings For a complete list of the genes assayed, please refer to the Appendix. ATM G204* KRAS G12C PDGFRA P122T SMO R199W 7 Disease relevant genes with no reportable alterations: ALK, BRAF, EGFR, ERBB2, MET, RET, ROS1  PDL 1 expression was 1%  PRIOR THERAPY:  1) SRS to multiple brain metastasis under the care of Dr. Isidore Moos. 2) SRS to a 4 mm new brain metastasis under the care of Dr. Isidore Moos and Dr. Saintclair Halsted on 09/02/2020  CURRENT THERAPY: Systemic chemotherapy with carboplatin for AUC of 5, Alimta 500 mg/M2 and Keytruda 200 mg IV every 3 weeks.  First dose October 05, 2018.  Status post 44 cycles.  Starting from cycle #5 the patient will be on treatment with maintenance Alimta 500 mg/M2 and Keytruda 200 mg IV every 3 weeks.  She has been on treatment with single agent Keytruda recently secondary to renal insufficiency.  INTERVAL HISTORY: Carolyn [name] 66 y.o. female returns to the clinic today for follow-up visit.  The patient is feeling fine today with no concerning complaints except for the baseline shortness of breath with exertion and the left shoulder pain.  She was seen by orthopedic surgeon currently on conservative treatment and she will start physical therapy soon.  She has no chest pain, cough or hemoptysis.  She has no nausea, vomiting, diarrhea or constipation.  She has no headache or visual changes.  She is here today for evaluation before  starting cycle #45 of her treatment.  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  MEDICATIONS:  Current Outpatient Medications  Medication Sig Dispense Refill   acetaminophen (TYLENOL) 500 MG tablet Take 1,000 mg by mouth every 8 (eight) hours as needed for mild pain, fever or headache.      amLODipine (NORVASC) 10 MG tablet Take 1 tablet by mouth once daily 90 tablet 0   aspirin EC 81 MG tablet Take 81 mg by mouth daily.     carvedilol (COREG) 25 MG tablet Take 1/2 (one-half) tablet by mouth twice daily 180 tablet 0   Cholecalciferol (VITAMIN D-3) 125 MCG (5000 UT) TABS Take 5,000 Units by mouth daily.  Chromium Picolinate 1000 MCG TABS Take 1,000 mg by mouth daily.     empagliflozin (JARDIANCE) 25 MG TABS tablet Take 1 tablet (25 mg total) by mouth daily before breakfast. 90 tablet 3   esomeprazole (NEXIUM) 20 MG capsule Take 1 capsule by mouth daily.     ezetimibe (ZETIA) 10 MG tablet TAKE 1 TABLET BY MOUTH ONCE DAILY AFTER SUPPER 90 tablet 3   fexofenadine (ALLEGRA) 180 MG tablet 1 tablet swallow whole with water; do not take with fruit juices.     HYDROcodone-acetaminophen (NORCO) 7.5-325 MG tablet Take 1 tablet by mouth every 6 (six) hours as needed for moderate pain. 12 tablet 0   Lancets (ONETOUCH DELICA PLUS [id number]) MISC SMARTSIG:1 Topical 3  Times Daily     lidocaine-prilocaine (EMLA) cream Apply to the Port-A-Cath site 30 to 60 minutes before chemotherapy. 30 g 2   metFORMIN (GLUCOPHAGE) 500 MG tablet Take 500 mg by mouth daily after breakfast.     methocarbamol (ROBAXIN) 500 MG tablet Take 1 tablet (500 mg total) by mouth every 6 (six) hours as needed for muscle spasms. 21 tablet 0   nitroGLYCERIN (NITROSTAT) 0.4 MG SL tablet Place 1 tablet (0.4 mg total) under the tongue every 5 (five) minutes as needed for chest pain. 25 tablet 2   ONETOUCH ULTRA test strip USE 1 STRIP TO CHECK GLUCOSE THREE TIMES DAILY     pentoxifylline (TRENTAL) 400 MG CR tablet Take $RemoveBef'400mg'hSKACcqAVd$  tab daily x 1 week, then $RemoveBe'400mg'MzGtLCpvF$  BID; take with food 60 tablet 5   Pitavastatin Calcium (LIVALO) 4 MG TABS Take 1 tablet (4 mg total) by mouth at bedtime. 90 tablet 1   predniSONE (DELTASONE) 5 MG tablet TAKE 6 TABLETS ONCE DAILY FOR 2 DAYS THEN TAKE 5 TABLETS FOR 2 DAYS THEN TAKE 4 TABLETS FOR 2 DAYS THEN TAKE 3 TABLETS FOR 2 DAYS THEN TAKE 2 TABLETS FOR 2 DAYS THEN TAKE 1 TABLET FOR 2 DAYS. TAKE ALL DOSES WITH FOOD.     telmisartan (MICARDIS) 40 MG tablet Take 1 tablet by mouth daily.     vitamin E 180 MG (400 UNITS) capsule Take 400 IU daily x 1 week, then 400 IU BID 60 capsule 5   No current facility-administered medications for this visit.    SURGICAL HISTORY:  Past Surgical History:  Procedure Laterality Date   ABDOMINAL HYSTERECTOMY     bone spur     CARDIAC CATHETERIZATION  06/13/2012   carpel tunnel surgery Left    COLONOSCOPY     9 years ago   CORONARY ANGIOPLASTY  06/13/2012   EYE SURGERY     cyst left eye    IR IMAGING GUIDED PORT INSERTION  10/10/2018   LEFT HEART CATHETERIZATION WITH CORONARY ANGIOGRAM N/A 06/13/2012   Procedure: LEFT HEART CATHETERIZATION WITH CORONARY ANGIOGRAM;  Surgeon: Laverda Page, MD;  [facility] C[facility] LAB;  Service: Cardiovascular;  Laterality: N/A;   NECK SURGERY     rotator cuff surgery Right 2015   VIDEO BRONCHOSCOPY WITH  ENDOBRONCHIAL NAVIGATION N/A 09/06/2018   Procedure: VIDEO BRONCHOSCOPY WITH ENDOBRONCHIAL NAVIGATION, left upper lung;  Surgeon: Lajuana Matte, MD;  Location: [facility];  Service: Thoracic;  Laterality: N/A;   VIDEO BRONCHOSCOPY WITH ENDOBRONCHIAL ULTRASOUND Left 09/06/2018   Procedure: VIDEO BRONCHOSCOPY WITH ENDOBRONCHIAL ULTRASOUND, left lung;  Surgeon: Lajuana Matte, MD;  Location: [facility];  Service: Thoracic;  Laterality: Left;   WISDOM TOOTH EXTRACTION      REVIEW OF SYSTEMS:  A comprehensive  review of systems was negative except for: Constitutional: positive for fatigue Respiratory: positive for dyspnea on exertion Musculoskeletal: positive for arthralgias   PHYSICAL EXAMINATION: General appearance: alert, cooperative, fatigued, and no distress Head: Normocephalic, without obvious abnormality, atraumatic Neck: no adenopathy, no JVD, supple, symmetrical, trachea midline, and thyroid not enlarged, symmetric, no tenderness/mass/nodules Lymph nodes: Cervical, supraclavicular, and axillary nodes normal. Resp: clear to auscultation bilaterally Back: symmetric, no curvature. ROM normal. No CVA tenderness. Cardio: regular rate and rhythm, S1, S2 normal, no murmur, click, rub or gallop GI: soft, non-tender; bowel sounds normal; no masses,  no organomegaly Extremities: extremities normal, atraumatic, no cyanosis or edema  ECOG PERFORMANCE STATUS: 1 - Symptomatic but completely ambulatory  Blood pressure 137/70, pulse 60, temperature (!) 97.1 F (36.2 C), temperature source Tympanic, resp. rate 19, height 5' 0.5" (1.537 m), weight 187 lb 1.6 oz (84.9 kg), SpO2 100 %.  LABORATORY DATA: Lab Results  Component Value Date   WBC 6.6 04/15/2021   HGB 11.7 (L) 04/15/2021   HCT 38.5 04/15/2021   MCV 79.4 (L) 04/15/2021   PLT 287 04/15/2021      Chemistry      Component Value Date/Time   NA 134 (L) 03/25/2021 0800   NA 139 08/18/2020 0801   K 4.3 03/25/2021 0800   CL 100  03/25/2021 0800   CO2 23 03/25/2021 0800   BUN 23 03/25/2021 0800   BUN 16 08/18/2020 0801   CREATININE 1.65 (H) 03/25/2021 0800      Component Value Date/Time   CALCIUM 8.5 (L) 03/25/2021 0800   ALKPHOS 58 03/25/2021 0800   AST 30 03/25/2021 0800   ALT 28 03/25/2021 0800   BILITOT 0.4 03/25/2021 0800       RADIOGRAPHIC STUDIES: MR Brain W Wo Contrast  Result Date: 03/20/2021 CLINICAL DATA:  66 year old female with metastatic lung cancer. Status post whole brain radiation in 2020. Small new left temporal lobe and right frontal lobe metastases last year status post SRS. EXAM: MRI HEAD WITHOUT AND WITH CONTRAST TECHNIQUE: Multiplanar, multiecho pulse sequences of the brain and surrounding structures were obtained without and with intravenous contrast. CONTRAST:  48mL MULTIHANCE GADOBENATE DIMEGLUMINE 529 MG/ML IV SOLN COMPARISON:  12/05/2020 brain MRI and earlier. FINDINGS: Brain: Enlarged area of enhancement at the treated left anterior superior temporal lobe metastasis, now 17 x 8 x 18 mm (AP by transverse by CC). See series 12, image 63). Previously this was about 7 mm diameter. The lesion itself has intermediate density T2 signal and is mildly diffusion restricted. And there is confluent new T2 and FLAIR hyperintensity throughout the superior left temporal gyrus tracking into the anterior temporal tip and central temporal lobe white matter compatible with vasogenic edema. But mild if any associated regional mass effect. Small treated right frontal lobe metastasis remains enhancing and is 4-5 mm now on series 12, image 124, slightly larger, but without associated edema or mass effect. No other abnormal intracranial enhancement or edema. No dural thickening. No superimposed restricted diffusion suggestive of acute infarction. No midline shift, ventriculomegaly, extra-axial collection or acute intracranial hemorrhage. Cervicomedullary junction and pituitary are within normal limits. Stable cerebral  volume. Chronic posterior left cerebellar encephalomalacia is stable. Relatively mild periventricular and other scattered T2 and FLAIR hyperintense foci are stable. Vascular: Major intracranial vascular flow voids are stable. On series 13 the major dural venous sinuses are enhancing and appear to be patent. Skull and upper cervical spine: Prior cervical ACDF. Degenerative ligamentous hypertrophy about the odontoid. Otherwise negative  visible cervical spine. Visible bone marrow signal is stable and within normal limits. Sinuses/Orbits: Stable, negative. Other: Ongoing questionable asymmetric hyperenhancement of the left facial nerve anterior genu, but other visible internal auditory structures appear stable and negative. Negative visible scalp and face. IMPRESSION: 1. Enlarging enhancement at the treated left temporal lobe metastasis, now 17 x 8 x 18 mm, with confluent new regional vasogenic edema but no significant mass effect. Favor Radiation Necrosis over true progression at this time. 2. Slightly larger but overall satisfactory appearance of the small treated right frontal lobe metastasis. 3. Stable brain elsewhere.  No new metastatic disease identified. Electronically Signed   By: Genevie Ann M.D.   On: 03/20/2021 12:38   MR Shoulder Left w/ contrast  Result Date: 03/28/2021 CLINICAL DATA:  MR arthrogram left shoulder eval for RCT ** history of lung cancer EXAM: MR ARTHROGRAM OF THE LEFT SHOULDER TECHNIQUE: Multiplanar, multisequence MR imaging of the left shoulder was performed following the administration of intra-articular contrast. CONTRAST:  See Injection Documentation. COMPARISON:  None. FINDINGS: Rotator cuff: There are full-thickness, full width tears of the supraspinatus and infraspinatus tendons with up to 2.5 cm retraction. Teres minor is intact. The subscapularis tendon appears intact. Muscles: No significant muscle atrophy. Biceps Long Head: Intraarticular and extraarticular portions of the biceps  tendon are intact. Acromioclavicular Joint: Minimal arthropathy of the acromioclavicular joint. Large amount of subacromial/subdeltoid bursal fluid due to the full-thickness cuff tear. Glenohumeral Joint: Adequatedistension of the glenohumeral joint. Mild chondrosis. No focal chondral defect. Labrum: No evidence of tear. Bones: No fracture or dislocation. No aggressive osseous lesion. Other: No additional findings. IMPRESSION: Full-thickness, full width tears of the supraspinatus and infraspinatus tendons with up to 2.5 cm retraction. No significant muscle atrophy. No evidence of labral tear. Electronically Signed   By: Maurine Simmering M.D.   On: 03/28/2021 12:42   Arthrogram  Result Date: 03/27/2021 CLINICAL DATA:  Left shoulder pain. FLUOROSCOPY TIME:  Radiation Exposure Index (as provided by the fluoroscopic device): 0.8 mGy Kerma PROCEDURE: The risks and benefits of the procedure were discussed with the patient, and written informed consent was obtained. The patient stated no history of allergy to contrast media. A formal timeout procedure was performed with the patient according to departmental protocol. The patient was placed supine on the fluoroscopy table and the left glenohumeral joint was identified under fluoroscopy. The skin overlying the left glenohumeral joint was subsequently cleaned with Betadine and a sterile drape was placed over the area of interest. 2 ml 1% Lidocaine was used to anesthetize the skin around the needle insertion site. A 22 gauge spinal needle was inserted into the left glenohumeral joint under fluoroscopy. 12 ml of gadolinium mixture (0.1 ml of Multihance mixed with 15 ml of Isovue-M 200 contrast and 5 ml of sterile saline) were injected into the left glenohumeral joint. Contrast extends into the subacromial/subdeltoid bursa, consistent with full-thickness rotator cuff tear. The needle was removed and hemostasis was achieved. The patient was subsequently transferred to MRI for  imaging. This exam was performed by Brynda Greathouse PA-C, and was supervised and interpreted by Fabiola Backer MD. IMPRESSION: 1. Technically successful left shoulder injection for MRI. 2. Evidence of full-thickness rotator cuff tear. Electronically Signed   By: Titus Dubin M.D.   On: 03/27/2021 16:04     ASSESSMENT AND PLAN: This is a very pleasant 66 years old African-American female with likely stage IV (T1c, N1, M1C) non-small cell lung cancer, adenocarcinoma with K-ras G12C mutation diagnosed in  July 2020 and presented with left upper lobe lung nodule in addition to right upper lobe pulmonary nodule as well as left hilar adenopathy and metastatic lesion to the brain. Molecular studies showed no actionable mutation and PDL 1 expression was 1%. The patient underwent SBRT to the metastatic brain lesion under the care of Dr. Isidore Moos. The patient is currently undergoing systemic chemotherapy with carboplatin for AUC of 5, Alimta 500 mg/M2 and Keytruda 200 mg IV every 3 weeks is status post 44 cycles.  Starting from cycle #5 she is on maintenance treatment with Alimta and Keytruda every 3 weeks with only Keytruda on the last few cycles because of the renal insufficiency. The patient has been tolerating her treatment well with no concerning adverse effects except for mild fatigue. I recommended for her to proceed with cycle #45 today as planned. I will see her back for follow-up visit in 3 weeks for evaluation before starting cycle #46. For the right shoulder pain, she is followed by Dr. Marlou Sa and expected to start physical therapy soon. The patient was advised to call immediately if she has any concerning symptoms in the interval.  The patient voices understanding of current disease status and treatment options and is in agreement with the current care plan. All questions were answered. The patient knows to call the clinic with any problems, questions or concerns. We can certainly see the patient much  sooner if necessary.  Disclaimer: This note was dictated with voice recognition software. Similar sounding words can inadvertently be transcribed and may not be corrected upon review.

## 2021-04-15 NOTE — Progress Notes (Signed)
Per Dr Julien Nordmann ,it is okay to treat pt today with Keytruda and creatinine of 1.56. ?

## 2021-04-17 ENCOUNTER — Telehealth: Payer: Self-pay

## 2021-04-17 NOTE — Telephone Encounter (Signed)
Called patient to inform her that she can receive her shingles vaccine.  ?

## 2021-04-24 ENCOUNTER — Other Ambulatory Visit: Payer: Self-pay

## 2021-04-24 ENCOUNTER — Ambulatory Visit (INDEPENDENT_AMBULATORY_CARE_PROVIDER_SITE_OTHER): Payer: Medicare Other | Admitting: Orthopedic Surgery

## 2021-04-24 ENCOUNTER — Encounter: Payer: Self-pay | Admitting: Orthopedic Surgery

## 2021-04-24 DIAGNOSIS — M75122 Complete rotator cuff tear or rupture of left shoulder, not specified as traumatic: Secondary | ICD-10-CM | POA: Diagnosis not present

## 2021-04-24 NOTE — Progress Notes (Signed)
? ?Office Visit Note ?  ?Patient: Carolyn [name]           ?Date of Birth: 1955-03-04           ?MRN: [id number] ?Visit Date: 04/24/2021 ?Requested by: [name], [address] [place],  [place] [address] ?PCP: [name], FNP ? ?Subjective: ?No chief complaint on file. ? ? ?HPI: Patient presents for evaluation of left shoulder pain.  Since she was last seen she has had an MRI scan which shows complete tears of the infraspinatus and supraspinatus with 2.5 cm of retraction.  Patient is undergoing treatment for metastatic cancer with chemotherapy every 3 weeks.  She is not really functional at all overhead and is having pain on a regular basis.  Husband is having neck surgery in the near future. ?             ?ROS: All systems reviewed are negative as they relate to the chief complaint within the history of present illness.  Patient denies  fevers or chills. ? ? ?Assessment & Plan: ?Visit Diagnoses: No diagnosis found. ? ?Plan: Impression is retracted rotator cuff tears which do appear to be repairable at this time.  The rigorous nature of the rehabilitative process is discussed with the patient.  Without surgical intervention think it is unlikely she will regain much function overhead.  She is going to consider her options and call us and let us know after her husband surgery if she wants to proceed.  I think she is leaning towards getting them fixed.  Anticipate that within the reasonably quick timeframe of this injury that the tendon should be mobile.  Patient understands the risk and benefits of surgery particularly with chemotherapy lowering her immune status and the slightly increased risk of infection.  We would use postop brace versus CPM machine to facilitate return of range of motion.  All questions answered ? ?Follow-Up Instructions: No follow-ups on file.  ? ?Orders:  ?No orders of the defined types were placed in this encounter. ? ?No orders of the defined types were  placed in this encounter. ? ? ? ? Procedures: ?No procedures performed ? ? ?Clinical Data: ?No additional findings. ? ?Objective: ?Vital Signs: There were no vitals taken for this visit. ? ?Physical Exam:  ? ?Constitutional: Patient appears well-developed ?HEENT:  ?Head: Normocephalic ?Eyes:EOM are normal ?Neck: Normal range of motion ?Cardiovascular: Normal rate ?Pulmonary/chest: Effort normal ?Neurologic: Patient is alert ?Skin: Skin is warm ?Psychiatric: Patient has normal mood and affect ? ? ?Ortho Exam: Ortho exam demonstrates good cervical spine range of motion.  She does not have active forward flexion and abduction above 90 degrees but it is slightly better than it was last visit.  Does have weakness to infraspinatus and supraspinatus testing on the left but subscap strength is intact.  No Popeye deformity in the left shoulder.  No other masses lymphadenopathy or skin changes noted in that shoulder girdle region. ? ?Specialty Comments:  ?No specialty comments available. ? ?Imaging: ?No results found. ? ? ?PMFS History: ?Patient Active Problem List  ? Diagnosis Date Noted  ? Elevated serum creatinine 04/12/2019  ? Pyelonephritis 02/08/2019  ? Port-A-Cath in place 10/19/2018  ? Encounter for antineoplastic chemotherapy 09/28/2018  ? Encounter for antineoplastic immunotherapy 09/28/2018  ? Goals of care, counseling/discussion 09/28/2018  ? Adenocarcinoma, lung ([place]) 09/12/2018  ? Brain metastases ([place]) 08/30/2018  ? Mass of upper lobe of left lung 08/17/2018  ? Angina pectoris ([place]) 06/14/2012  ?  CAD (coronary artery disease), native coronary artery 06/14/2012  ? S/P PTCA (percutaneous transluminal coronary angioplasty) 06/14/2012  ? Hyperlipidemia 06/14/2012  ? Essential hypertension 06/14/2012  ? Pre-diabetes 06/14/2012  ? ?Past Medical History:  ?Diagnosis Date  ? Anginal pain ([facility])   ? " with exertion "  ? Arthritis   ? " 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 "  ? Asthma   ? h/o  ? Brain metastases ([place]) 08/2018  ? Coronary artery  disease   ? Diabetes mellitus without complication ([facility])   ? Type II  ? GERD (gastroesophageal reflux disease)   ? H/O hiatal hernia   ? Heart murmur   ? History of radiation therapy 09/22/2018  ? stereotactic radiosurgery   ? HPV in female   ? h/o  ? Hypertension   ? nscl ca dx'd 08/07/18  ? Persistent headaches   ? h/o  ? Pneumonia   ? hx of PNA  ? Shortness of breath   ? Vaginal dryness   ? h/o  ?  ?Family History  ?Problem Relation Age of Onset  ? Breast cancer Other   ? Diabetes Mother   ? Glaucoma Mother   ? Hypertension Mother   ? Hypertension Father   ? Asthma Father   ? Diabetes Carolyn   ? Diabetes Brother   ? Hypertension Carolyn   ?  ?Past Surgical History:  ?Procedure Laterality Date  ? ABDOMINAL HYSTERECTOMY    ? bone spur    ? CARDIAC CATHETERIZATION  06/13/2012  ? carpel tunnel surgery Left   ? COLONOSCOPY    ? 9 years ago  ? CORONARY ANGIOPLASTY  06/13/2012  ? EYE SURGERY    ? cyst left eye   ? IR IMAGING GUIDED PORT INSERTION  10/10/2018  ? LEFT HEART CATHETERIZATION WITH CORONARY ANGIOGRAM N/A 06/13/2012  ? Procedure: LEFT HEART CATHETERIZATION WITH CORONARY ANGIOGRAM;  Surgeon: Laverda Page, MD;  Location: [facility] CATH LAB;  Service: Cardiovascular;  Laterality: N/A;  ? NECK SURGERY    ? rotator cuff surgery Right 2015  ? VIDEO BRONCHOSCOPY WITH ENDOBRONCHIAL NAVIGATION N/A 09/06/2018  ? Procedure: VIDEO BRONCHOSCOPY WITH ENDOBRONCHIAL NAVIGATION, left upper lung;  Surgeon: Lajuana Matte, MD;  Location: [facility];  Service: Thoracic;  Laterality: N/A;  ? VIDEO BRONCHOSCOPY WITH ENDOBRONCHIAL ULTRASOUND Left 09/06/2018  ? Procedure: VIDEO BRONCHOSCOPY WITH ENDOBRONCHIAL ULTRASOUND, left lung;  Surgeon: Lajuana Matte, MD;  Location: [facility];  Service: Thoracic;  Laterality: Left;  ? WISDOM TOOTH EXTRACTION    ? ?Social History  ? ?Occupational History  ? Not on file  ?Tobacco Use  ? Smoking status: Former  ?  Packs/day: 0.50  ?  Years: 30.00  ?  Pack years: 15.00  ?  Types: Cigarettes  ?  Quit date:  02/09/1999  ?  Years since quitting: 22.2  ? Smokeless tobacco: Never  ?Vaping Use  ? Vaping Use: Never used  ?Substance and Sexual Activity  ? Alcohol use: Not Currently  ? Drug use: Not Currently  ? Sexual activity: Yes  ?  Birth control/protection: Post-menopausal  ? ? ? ? ? ?

## 2021-04-25 DIAGNOSIS — Z20822 Contact with and (suspected) exposure to covid-19: Secondary | ICD-10-CM | POA: Diagnosis not present

## 2021-05-01 DIAGNOSIS — Z20822 Contact with and (suspected) exposure to covid-19: Secondary | ICD-10-CM | POA: Diagnosis not present

## 2021-05-06 ENCOUNTER — Inpatient Hospital Stay: Payer: Medicare Other

## 2021-05-06 ENCOUNTER — Other Ambulatory Visit: Payer: Medicare Other

## 2021-05-06 ENCOUNTER — Ambulatory Visit: Payer: Medicare Other | Admitting: Internal Medicine

## 2021-05-06 ENCOUNTER — Inpatient Hospital Stay (HOSPITAL_BASED_OUTPATIENT_CLINIC_OR_DEPARTMENT_OTHER): Payer: Medicare Other | Admitting: Internal Medicine

## 2021-05-06 ENCOUNTER — Other Ambulatory Visit: Payer: Self-pay

## 2021-05-06 ENCOUNTER — Ambulatory Visit: Payer: Medicare Other

## 2021-05-06 VITALS — BP 145/68 | HR 61 | Temp 98.3°F | Resp 18 | Ht 60.5 in | Wt 184.1 lb

## 2021-05-06 DIAGNOSIS — C3491 Malignant neoplasm of unspecified part of right bronchus or lung: Secondary | ICD-10-CM

## 2021-05-06 DIAGNOSIS — C3490 Malignant neoplasm of unspecified part of unspecified bronchus or lung: Secondary | ICD-10-CM | POA: Diagnosis not present

## 2021-05-06 DIAGNOSIS — C7931 Secondary malignant neoplasm of brain: Secondary | ICD-10-CM

## 2021-05-06 DIAGNOSIS — R5383 Other fatigue: Secondary | ICD-10-CM

## 2021-05-06 DIAGNOSIS — Z79899 Other long term (current) drug therapy: Secondary | ICD-10-CM | POA: Diagnosis not present

## 2021-05-06 DIAGNOSIS — C3412 Malignant neoplasm of upper lobe, left bronchus or lung: Secondary | ICD-10-CM | POA: Diagnosis not present

## 2021-05-06 DIAGNOSIS — C3492 Malignant neoplasm of unspecified part of left bronchus or lung: Secondary | ICD-10-CM

## 2021-05-06 DIAGNOSIS — Z5112 Encounter for antineoplastic immunotherapy: Secondary | ICD-10-CM | POA: Diagnosis not present

## 2021-05-06 LAB — CMP (CANCER CENTER ONLY)
ALT: 12 U/L (ref 0–44)
AST: 20 U/L (ref 15–41)
Albumin: 4.1 g/dL (ref 3.5–5.0)
Alkaline Phosphatase: 83 U/L (ref 38–126)
Anion gap: 8 (ref 5–15)
BUN: 17 mg/dL (ref 8–23)
CO2: 27 mmol/L (ref 22–32)
Calcium: 9.5 mg/dL (ref 8.9–10.3)
Chloride: 106 mmol/L (ref 98–111)
Creatinine: 1.66 mg/dL — ABNORMAL HIGH (ref 0.44–1.00)
GFR, Estimated: 34 mL/min — ABNORMAL LOW (ref >60)
Glucose, Bld: 104 mg/dL — ABNORMAL HIGH (ref 70–99)
Potassium: 4.3 mmol/L (ref 3.5–5.1)
Sodium: 141 mmol/L (ref 135–145)
Total Bilirubin: 0.3 mg/dL (ref 0.3–1.2)
Total Protein: 7.6 g/dL (ref 6.5–8.1)

## 2021-05-06 LAB — CBC WITH DIFFERENTIAL (CANCER CENTER ONLY)
Abs Immature Granulocytes: 0.01 10*3/uL (ref 0.00–0.07)
Basophils Absolute: 0.1 10*3/uL (ref 0.0–0.1)
Basophils Relative: 1 %
Eosinophils Absolute: 0.4 10*3/uL (ref 0.0–0.5)
Eosinophils Relative: 5 %
HCT: 40.8 % (ref 36.0–46.0)
Hemoglobin: 12.5 g/dL (ref 12.0–15.0)
Immature Granulocytes: 0 %
Lymphocytes Relative: 19 %
Lymphs Abs: 1.5 10*3/uL (ref 0.7–4.0)
MCH: 24.2 pg — ABNORMAL LOW (ref 26.0–34.0)
MCHC: 30.6 g/dL (ref 30.0–36.0)
MCV: 78.9 fL — ABNORMAL LOW (ref 80.0–100.0)
Monocytes Absolute: 0.7 10*3/uL (ref 0.1–1.0)
Monocytes Relative: 9 %
Neutro Abs: 4.9 10*3/uL (ref 1.7–7.7)
Neutrophils Relative %: 66 %
Platelet Count: 247 10*3/uL (ref 150–400)
RBC: 5.17 MIL/uL — ABNORMAL HIGH (ref 3.87–5.11)
RDW: 17.1 % — ABNORMAL HIGH (ref 11.5–15.5)
WBC Count: 7.6 10*3/uL (ref 4.0–10.5)
nRBC: 0 % (ref 0.0–0.2)

## 2021-05-06 LAB — TSH: TSH: 2.282 u[IU]/mL (ref 0.308–3.960)

## 2021-05-06 MED ORDER — HEPARIN SOD (PORK) LOCK FLUSH 100 UNIT/ML IV SOLN
500.0000 [IU] | Freq: Once | INTRAVENOUS | Status: AC | PRN
  Administered 2021-05-06: 500 [IU]

## 2021-05-06 MED ORDER — SODIUM CHLORIDE 0.9% FLUSH
10.0000 mL | INTRAVENOUS | Status: DC | PRN
  Administered 2021-05-06: 10 mL

## 2021-05-06 MED ORDER — SODIUM CHLORIDE 0.9 % IV SOLN
200.0000 mg | Freq: Once | INTRAVENOUS | Status: AC
  Administered 2021-05-06: 200 mg via INTRAVENOUS
  Filled 2021-05-06: qty 200

## 2021-05-06 MED ORDER — SODIUM CHLORIDE 0.9 % IV SOLN: Freq: Once | INTRAVENOUS | Status: AC

## 2021-05-06 NOTE — Progress Notes (Signed)
?    [facility] ?[phone]   [phone] ? ?OFFICE PROGRESS NOTE ? ?[name], [place] ?[place] ?[place] [address] ? ?DIAGNOSIS: Stage IV (T1c, N1, M1c ) non-small cell lung cancer, adenocarcinoma diagnosed in July 2020 and presented with left upper lobe lung nodule in addition to right upper lobe lung nodule with left hilar lymphadenopathy as well as multiple brain metastasis. ? ?Biomarker Findings ?Microsatellite status - MS-Stable ?Tumor Mutational Burden - 4 Muts/Mb ?Genomic Findings ?For a complete list of the genes assayed, please refer to the Appendix. ?ATM G204* ?KRAS G12C ?PDGFRA P122T ?SMO R199W ?7 Disease relevant genes with no reportable alterations: ALK, BRAF, ?EGFR, ERBB2, MET, RET, ROS1 ? ?PDL 1 expression was 1% ? ?PRIOR THERAPY:  ?1) SRS to multiple brain metastasis under the care of Dr. Isidore Moos. ?2) SRS to a 4 mm new brain metastasis under the care of Dr. Isidore Moos and Dr. Saintclair Halsted on 09/02/2020 ? ?CURRENT THERAPY: Systemic chemotherapy with carboplatin for AUC of 5, Alimta 500 mg/M2 and Keytruda 200 mg IV every 3 weeks.  First dose October 05, 2018.  Status post 45 cycles.  Starting from cycle #5 the patient will be on treatment with maintenance Alimta 500 mg/M2 and Keytruda 200 mg IV every 3 weeks.  She has been on treatment with single agent Keytruda recently secondary to renal insufficiency. ? ?INTERVAL HISTORY: ?Carolyn [name] 66 y.o. female returns to the clinic today for follow-up visit.  The patient is feeling fine today with no concerning complaints except for the pain on the left shoulder area.  She was seen by Dr. Marlou Sa and expected to have surgery for the torn ligament in this area next week.  The patient denied having any chest pain, shortness of breath, cough or hemoptysis.  She denied having any nausea, vomiting, diarrhea or constipation.  She has no headache or visual changes.  She denied having any significant weight  loss or night sweats.  She continues to tolerate her treatment with Keytruda fairly well.  She is here today for evaluation before starting cycle #46. ? ? ?MEDICAL HISTORY: ?Past Medical History:  ?Diagnosis Date  ? Anginal pain ([place])   ? " with exertion "  ? Arthritis   ? " 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 "  ? Asthma   ? h/o  ? Brain metastases ([facility]) 08/2018  ? Coronary artery disease   ? Diabetes mellitus without complication ([place])   ? Type II  ? GERD (gastroesophageal reflux disease)   ? H/O hiatal hernia   ? Heart murmur   ? History of radiation therapy 09/22/2018  ? stereotactic radiosurgery   ? HPV in female   ? h/o  ? Hypertension   ? nscl ca dx'd 08/07/18  ? Persistent headaches   ? h/o  ? Pneumonia   ? hx of PNA  ? Shortness of breath   ? Vaginal dryness   ? h/o  ? ? ?ALLERGIES:  is allergic to atorvastatin, crestor [rosuvastatin], and lisinopril. ? ?MEDICATIONS:  ?Current Outpatient Medications  ?Medication Sig Dispense Refill  ? acetaminophen (TYLENOL) 500 MG tablet Take 1,000 mg by mouth every 8 (eight) hours as needed for mild pain, fever or headache.     ? amLODipine (NORVASC) 10 MG tablet Take 1 tablet by mouth once daily 90 tablet 0  ? aspirin EC 81 MG tablet Take 81 mg by mouth daily.    ? carvedilol (COREG) 25 MG tablet Take 1/2 (one-half)  tablet by mouth twice daily 180 tablet 0  ? Cholecalciferol (VITAMIN D-3) 125 MCG (5000 UT) TABS Take 5,000 Units by mouth daily.    ? Chromium Picolinate 1000 MCG TABS Take 1,000 mg by mouth daily.    ? empagliflozin (JARDIANCE) 25 MG TABS tablet Take 1 tablet (25 mg total) by mouth daily before breakfast. 90 tablet 3  ? esomeprazole (NEXIUM) 20 MG capsule Take 1 capsule by mouth daily.    ? ezetimibe (ZETIA) 10 MG tablet TAKE 1 TABLET BY MOUTH ONCE DAILY AFTER SUPPER 90 tablet 3  ? fexofenadine (ALLEGRA) 180 MG tablet 1 tablet swallow whole with water; do not take with fruit juices.    ? HYDROcodone-acetaminophen (NORCO) 7.5-325 MG tablet Take 1 tablet by mouth every 6 (six) hours  as needed for moderate pain. 12 tablet 0  ? Lancets (ONETOUCH DELICA PLUS [id number]) MISC SMARTSIG:1 Topical 3 Times Daily    ? lidocaine-prilocaine (EMLA) cream Apply to the Port-A-Cath site 30 to 60 minutes before chemotherapy. 30 g 2  ? metFORMIN (GLUCOPHAGE) 500 MG tablet Take 500 mg by mouth daily after breakfast.    ? methocarbamol (ROBAXIN) 500 MG tablet Take 1 tablet (500 mg total) by mouth every 6 (six) hours as needed for muscle spasms. 21 tablet 0  ? nitroGLYCERIN (NITROSTAT) 0.4 MG SL tablet Place 1 tablet (0.4 mg total) under the tongue every 5 (five) minutes as needed for chest pain. 25 tablet 2  ? ONETOUCH ULTRA test strip USE 1 STRIP TO CHECK GLUCOSE THREE TIMES DAILY    ? pentoxifylline (TRENTAL) 400 MG CR tablet Take $RemoveBef'400mg'QlQQPNNuWr$  tab daily x 1 week, then $RemoveBe'400mg'fPELtGWNo$  BID; take with food 60 tablet 5  ? Pitavastatin Calcium (LIVALO) 4 MG TABS Take 1 tablet (4 mg total) by mouth at bedtime. 90 tablet 1  ? telmisartan (MICARDIS) 40 MG tablet Take 1 tablet by mouth daily.    ? vitamin E 180 MG (400 UNITS) capsule Take 400 IU daily x 1 week, then 400 IU BID 60 capsule 5  ? ?No current facility-administered medications for this visit.  ? ? ?SURGICAL HISTORY:  ?Past Surgical History:  ?Procedure Laterality Date  ? ABDOMINAL HYSTERECTOMY    ? bone spur    ? CARDIAC CATHETERIZATION  06/13/2012  ? carpel tunnel surgery Left   ? COLONOSCOPY    ? 9 years ago  ? CORONARY ANGIOPLASTY  06/13/2012  ? EYE SURGERY    ? cyst left eye   ? IR IMAGING GUIDED PORT INSERTION  10/10/2018  ? LEFT HEART CATHETERIZATION WITH CORONARY ANGIOGRAM N/A 06/13/2012  ? Procedure: LEFT HEART CATHETERIZATION WITH CORONARY ANGIOGRAM;  Surgeon: Laverda Page, MD;  Location: [facility] CATH LAB;  Service: Cardiovascular;  Laterality: N/A;  ? NECK SURGERY    ? rotator cuff surgery Right 2015  ? VIDEO BRONCHOSCOPY WITH ENDOBRONCHIAL NAVIGATION N/A 09/06/2018  ? Procedure: VIDEO BRONCHOSCOPY WITH ENDOBRONCHIAL NAVIGATION, left upper lung;  Surgeon: Lajuana Matte, MD;  Location: [facility];  Service: Thoracic;  Laterality: N/A;  ? VIDEO BRONCHOSCOPY WITH ENDOBRONCHIAL ULTRASOUND Left 09/06/2018  ? Procedure: VIDEO BRONCHOSCOPY WITH ENDOBRONCHIAL ULTRASOUND, left lung;  Surgeon: Lajuana Matte, MD;  Location: [facility];  Service: Thoracic;  Laterality: Left;  ? WISDOM TOOTH EXTRACTION    ? ? ?REVIEW OF SYSTEMS:  A comprehensive review of systems was negative except for: Constitutional: positive for fatigue ?Musculoskeletal: positive for arthralgias  ? ?PHYSICAL EXAMINATION: General appearance: alert, cooperative, and no distress ?Head: Normocephalic, without  obvious abnormality, atraumatic ?Neck: no adenopathy, no JVD, supple, symmetrical, trachea midline, and thyroid not enlarged, symmetric, no tenderness/mass/nodules ?Lymph nodes: Cervical, supraclavicular, and axillary nodes normal. ?Resp: clear to auscultation bilaterally ?Back: symmetric, no curvature. ROM normal. No CVA tenderness. ?Cardio: regular rate and rhythm, S1, S2 normal, no murmur, click, rub or gallop ?GI: soft, non-tender; bowel sounds normal; no masses,  no organomegaly ?Extremities: extremities normal, atraumatic, no cyanosis or edema ? ?ECOG PERFORMANCE STATUS: 1 - Symptomatic but completely ambulatory ? ?Blood pressure (!) 145/68, pulse 61, temperature 98.3 ?F (36.8 ?C), temperature source Oral, resp. rate 18, height 5' 0.5" (1.537 m), weight 184 lb 2 oz (83.5 kg), SpO2 100 %. ? ?LABORATORY DATA: ?Lab Results  ?Component Value Date  ? WBC 7.6 05/06/2021  ? HGB 12.5 05/06/2021  ? HCT 40.8 05/06/2021  ? MCV 78.9 (L) 05/06/2021  ? PLT 247 05/06/2021  ? ? ?  Chemistry   ?   ?Component Value Date/Time  ? NA 141 04/15/2021 0836  ? NA 139 08/18/2020 0801  ? K 4.4 04/15/2021 0836  ? CL 107 04/15/2021 0836  ? CO2 29 04/15/2021 0836  ? BUN 16 04/15/2021 0836  ? BUN 16 08/18/2020 0801  ? CREATININE 1.56 (H) 04/15/2021 0836  ?    ?Component Value Date/Time  ? CALCIUM 9.2 04/15/2021 0836  ? ALKPHOS 80 04/15/2021 0836  ?  AST 19 04/15/2021 0836  ? ALT 12 04/15/2021 0836  ? BILITOT 0.3 04/15/2021 0836  ?  ? ? ? ?RADIOGRAPHIC STUDIES: ?No results found. ? ? ?ASSESSMENT AND PLAN: This is a very pleasant 66 years old [place]

## 2021-05-06 NOTE — Progress Notes (Signed)
Per Julien Nordmann MD, ok to treat today with SCR 1.66.  ?

## 2021-05-06 NOTE — Patient Instructions (Signed)
[facility] MEDICAL ONCOLOGY  Discharge Instructions: ?Thank you for choosing [facility] to provide your oncology and hematology care.  ? ?If you have a lab appointment with the Cancer Center, please go directly to the Cancer Center and check in at the registration area. ?  ?Wear comfortable clothing and clothing appropriate for easy access to any Portacath or PICC line.  ? ?We strive to give you quality time with your provider. You may need to reschedule your appointment if you arrive late (15 or more minutes).  Arriving late affects you and other patients whose appointments are after yours.  Also, if you miss three or more appointments without notifying the office, you may be dismissed from the clinic at the provider?s discretion.    ?  ?For prescription refill requests, have your pharmacy contact our office and allow 72 hours for refills to be completed.   ? ?Today you received the following chemotherapy and/or immunotherapy agents: Keytruda ?  ?To help prevent nausea and vomiting after your treatment, we encourage you to take your nausea medication as directed. ? ?BELOW ARE SYMPTOMS THAT SHOULD BE REPORTED IMMEDIATELY: ?*FEVER GREATER THAN 100.4 F (38 ?C) OR HIGHER ?*CHILLS OR SWEATING ?*NAUSEA AND VOMITING THAT IS NOT CONTROLLED WITH YOUR NAUSEA MEDICATION ?*UNUSUAL SHORTNESS OF BREATH ?*UNUSUAL BRUISING OR BLEEDING ?*URINARY PROBLEMS (pain or burning when urinating, or frequent urination) ?*BOWEL PROBLEMS (unusual diarrhea, constipation, pain near the anus) ?TENDERNESS IN MOUTH AND THROAT WITH OR WITHOUT PRESENCE OF ULCERS (sore throat, sores in mouth, or a toothache) ?UNUSUAL RASH, SWELLING OR PAIN  ?UNUSUAL VAGINAL DISCHARGE OR ITCHING  ? ?Items with * indicate a potential emergency and should be followed up as soon as possible or go to the Emergency Department if any problems should occur. ? ?Please show the CHEMOTHERAPY ALERT CARD or IMMUNOTHERAPY ALERT CARD at check-in to the  Emergency Department and triage nurse. ? ?Should you have questions after your visit or need to cancel or reschedule your appointment, please contact [facility]  Dept: [phone]  and follow the prompts.  Office hours are 8:00 a.m. to 4:30 p.m. Monday - Friday. Please note that voicemails left after 4:00 p.m. may not be returned until the following business day.  We are closed weekends and major holidays. You have access to a nurse at all times for urgent questions. Please call the main number to the clinic Dept: [phone] and follow the prompts. ? ? ?For any non-urgent questions, you may also contact your provider using MyChart. We now offer e-Visits for anyone 18 and older to request care online for non-urgent symptoms. For details visit [email or web address]. ?  ?Also download the MyChart app! Go to the app store, search "MyChart", open the app, select [facility], and log in with your MyChart username and password. ? ?Due to Covid, a mask is required upon entering the hospital/clinic. If you do not have a mask, one will be given to you upon arrival. For doctor visits, patients may have 1 support person aged 18 or older with them. For treatment visits, patients cannot have anyone with them due to current Covid guidelines and our immunocompromised population.  ? ?

## 2021-05-08 DIAGNOSIS — Z20822 Contact with and (suspected) exposure to covid-19: Secondary | ICD-10-CM | POA: Diagnosis not present

## 2021-05-11 NOTE — Pre-Procedure Instructions (Signed)
Surgical Instructions ? ? ? Your procedure is scheduled on Thursday, April 6th. ? Report to [facility] Main Entrance "A" at 7:30 A.M., then check in with the Admitting office. ? Call this number if you have problems the morning of surgery: ? [phone] ? ? If you have any questions prior to your surgery date call [phone]: Open Monday-Friday 8am-4pm ? ? ? Remember: ? Do not eat after midnight the night before your surgery ? ?You may drink clear liquids until 6:30 a.m. the morning of your surgery.   ?Clear liquids allowed are: Water, Non-Citrus Juices (without pulp), Carbonated Beverages, Clear Tea, Black Coffee Only (NO MILK, CREAM OR POWDERED CREAMER of any kind), and Gatorade. ? ? ?Enhanced Recovery after Surgery for Orthopedics ?Enhanced Recovery after Surgery is a protocol used to improve the stress on your body and your recovery after surgery. ? ?Patient Instructions ? ? ?The day of surgery (if you have diabetes): ? ?Drink ONE small 12 oz bottle of Gatorade G2 by 6:30 am the morning of surgery ?This bottle was given to you during your hospital  ?pre-op appointment visit.  ?Nothing else to drink after completing the  ?Small 12 oz bottle of Gatorade G2. ? ?       If you have questions, please contact your surgeon?s office. ? ?  ? Take these medicines the morning of surgery with A SIP OF WATER  ?amLODipine (NORVASC)  ?carvedilol (COREG) ?esomeprazole ([place])  ?fexofenadine (ALLEGRA)  ? ?As needed: ?acetaminophen (TYLENOL)  ?HYDROcodone-acetaminophen ([place]) ?methocarbamol (ROBAXIN) ?nitroGLYCERIN (NITROSTAT)-please let a nurse know if you had to use this.  ? ?Follow your surgeon's instructions on when to stop Aspirin and pentoxifylline (TRENTAL).  If no instructions were given by your surgeon then you will need to call the office to get those instructions.   ? ?As of today, STOP taking any Aleve, Naproxen, Ibuprofen, Motrin, Advil, Goody's, BC's, all herbal medications, fish oil, and all vitamins. ? ?WHAT  DO I DO ABOUT MY DIABETES MEDICATION? ? ? ?Do not take empagliflozin (JARDIANCE) or metFORMIN (GLUCOPHAGE) the morning of surgery. ? ?Do not take empagliflozin (JARDIANCE) on Wednesday (4/5) and Thursday (4/6).     ? ? ?HOW TO MANAGE YOUR DIABETES ?BEFORE AND AFTER SURGERY ? ?Why is it important to control my blood sugar before and after surgery? ?Improving blood sugar levels before and after surgery helps healing and can limit problems. ?A way of improving blood sugar control is eating a healthy diet by: ? Eating less sugar and carbohydrates ? Increasing activity/exercise ? Talking with your doctor about reaching your blood sugar goals ?High blood sugars (greater than 180 mg/dL) can raise your risk of infections and slow your recovery, so you will need to focus on controlling your diabetes during the weeks before surgery. ?Make sure that the doctor who takes care of your diabetes knows about your planned surgery including the date and location. ? ?How do I manage my blood sugar before surgery? ?Check your blood sugar at least 4 times a day, starting 2 days before surgery, to make sure that the level is not too high or low. ? ?Check your blood sugar the morning of your surgery when you wake up and every 2 hours until you get to the Short Stay unit. ? ?If your blood sugar is less than 70 mg/dL, you will need to treat for low blood sugar: ?Do not take insulin. ?Treat a low blood sugar (less than 70 mg/dL) with ? cup of clear juice (cranberry  or apple), 4 glucose tablets, OR glucose gel. ?Recheck blood sugar in 15 minutes after treatment (to make sure it is greater than 70 mg/dL). If your blood sugar is not greater than 70 mg/dL on recheck, call [phone] for further instructions. ?Report your blood sugar to the short stay nurse when you get to Short Stay. ? ?If you are admitted to the hospital after surgery: ?Your blood sugar will be checked by the staff and you will probably be given insulin after surgery (instead  of oral diabetes medicines) to make sure you have good blood sugar levels. ?The goal for blood sugar control after surgery is 80-180 mg/dL. ? ?         ?           ?Do NOT Smoke (Tobacco/Vaping) for 24 hours prior to your procedure. ? ?If you use a CPAP at night, you may bring your mask/headgear for your overnight stay. ?  ?Contacts, glasses, piercing's, hearing aid's, dentures or partials may not be worn into surgery, please bring cases for these belongings.  ?  ?For patients admitted to the hospital, discharge time will be determined by your treatment team. ?  ?Patients discharged the day of surgery will not be allowed to drive home, and someone needs to stay with them for 24 hours. ? ?SURGICAL WAITING ROOM VISITATION ?Patients having surgery or a procedure may have two support people in the waiting room. These visitors may be switched out with other visitors if needed. ?Children under the age of 46 must have an adult accompany them who is not the patient. ?If the patient needs to stay at the hospital during part of their recovery, the visitor guidelines for inpatient rooms apply. ? ?Please refer to the [facility] website for the visitor guidelines for Inpatients (after your surgery is over and you are in a regular room).  ? ? ?Special instructions:   ?[facility]- Preparing For Surgery ? ?Before surgery, you can play an important role. Because skin is not sterile, your skin needs to be as free of germs as possible. You can reduce the number of germs on your skin by washing with CHG (chlorahexidine gluconate) Soap before surgery.  CHG is an antiseptic cleaner which kills germs and bonds with the skin to continue killing germs even after washing.   ? ?Oral Hygiene is also important to reduce your risk of infection.  Remember - BRUSH YOUR TEETH THE MORNING OF SURGERY WITH YOUR REGULAR TOOTHPASTE ? ?Please do not use if you have an allergy to CHG or antibacterial soaps. If your skin becomes reddened/irritated stop  using the CHG.  ?Do not shave (including legs and underarms) for at least 48 hours prior to first CHG shower. It is OK to shave your face. ? ?Please follow these instructions carefully. ?  ?Shower the NIGHT BEFORE SURGERY and the MORNING OF SURGERY ? ?If you chose to wash your hair, wash your hair first as usual with your normal shampoo. ? ?After you shampoo, rinse your hair and body thoroughly to remove the shampoo. ? ?Use CHG Soap as you would any other liquid soap. You can apply CHG directly to the skin and wash gently with a scrungie or a clean washcloth.  ? ?Apply the CHG Soap to your body ONLY FROM THE NECK DOWN.  Do not use on open wounds or open sores. Avoid contact with your eyes, ears, mouth and genitals (private parts). Wash Face and genitals (private parts)  with your normal soap.  ? ?  Wash thoroughly, paying special attention to the area where your surgery will be performed. ? ?Thoroughly rinse your body with warm water from the neck down. ? ?DO NOT shower/wash with your normal soap after using and rinsing off the CHG Soap. ? ?Pat yourself dry with a CLEAN TOWEL. ? ?Wear CLEAN PAJAMAS to bed the night before surgery ? ?Place CLEAN SHEETS on your bed the night before your surgery ? ?DO NOT SLEEP WITH PETS. ? ? ?Day of Surgery: ?Take a shower with CHG soap. ?Do not wear jewelry or makeup ?Do not wear lotions, powders, perfumes, or deodorant. ?Do not shave 48 hours prior to surgery.   ?Do not bring valuables to the hospital.  ?[facility] is not responsible for any belongings or valuables. ?Do not wear nail polish, gel polish, artificial nails, or any other type of covering on natural nails (fingers and toes) ?If you have artificial nails or gel coating that need to be removed by a nail salon, please have this removed prior to surgery. Artificial nails or gel coating may interfere with anesthesia's ability to adequately monitor your vital signs. ?Wear Clean/Comfortable clothing the morning of surgery ?Do  not apply any deodorants/lotions.   ?Remember to brush your teeth WITH YOUR REGULAR TOOTHPASTE. ?  ?Please read over the following fact sheets that you were given. ? ? ? ?If you received a COVID test duri

## 2021-05-12 ENCOUNTER — Encounter (HOSPITAL_COMMUNITY): Payer: Self-pay

## 2021-05-12 ENCOUNTER — Other Ambulatory Visit: Payer: Self-pay

## 2021-05-12 ENCOUNTER — Encounter (HOSPITAL_COMMUNITY)
Admission: RE | Admit: 2021-05-12 | Discharge: 2021-05-12 | Disposition: A | Discharge status: Home / Self Care | Payer: Medicare Other | Source: Ambulatory Visit | Attending: Orthopedic Surgery | Admitting: Orthopedic Surgery

## 2021-05-12 DIAGNOSIS — E785 Hyperlipidemia, unspecified: Secondary | ICD-10-CM | POA: Diagnosis not present

## 2021-05-12 DIAGNOSIS — I2510 Atherosclerotic heart disease of native coronary artery without angina pectoris: Secondary | ICD-10-CM | POA: Diagnosis not present

## 2021-05-12 DIAGNOSIS — N183 Chronic kidney disease, stage 3 unspecified: Secondary | ICD-10-CM | POA: Diagnosis not present

## 2021-05-12 DIAGNOSIS — Z955 Presence of coronary angioplasty implant and graft: Secondary | ICD-10-CM | POA: Insufficient documentation

## 2021-05-12 DIAGNOSIS — I129 Hypertensive chronic kidney disease with stage 1 through stage 4 chronic kidney disease, or unspecified chronic kidney disease: Secondary | ICD-10-CM | POA: Insufficient documentation

## 2021-05-12 DIAGNOSIS — Z01812 Encounter for preprocedural laboratory examination: Secondary | ICD-10-CM | POA: Diagnosis not present

## 2021-05-12 DIAGNOSIS — E119 Type 2 diabetes mellitus without complications: Secondary | ICD-10-CM | POA: Insufficient documentation

## 2021-05-12 HISTORY — DX: Chronic kidney disease, unspecified: N18.9

## 2021-05-12 LAB — GLUCOSE, CAPILLARY: Glucose-Capillary: 128 mg/dL — ABNORMAL HIGH (ref 70–99)

## 2021-05-12 NOTE — Progress Notes (Signed)
PCP - [name], FNP ?Cardiologist - Dr. Einar Gip ?Oncologist- Dr. Curt Bears ? ?PPM/ICD - n/a ? ?Chest x-ray - n/a ?EKG - 01/28/21 ?Stress Test - 04/07/20 ?ECHO - 04/12/20 ?Cardiac Cath -2014  ? ?Sleep Study - denies ?CPAP - denies ? ?Fasting Blood Sugar - 98-109 ?Checks Blood Sugar 1 time a day, every morning.  ?Last A1C, per Dr. Irven Shelling note, labs were done 04/07/21 and A1C was 6.8.  ?CBG at PAT 128 ? ?Blood Thinner Instructions: [place]; Spoke with Annie Main, PA for Dr. Marlou Sa and he ordered for pt to stop immediately. ?Aspirin Instructions: Pt took dose this morning before appt. Instructed by PA to stop immediately.  ? ?ERAS Protcol - Clear liquids until 0630 DOS ?PRE-SURGERY Ensure or G2- G2 provided ? ?COVID TEST- n/a ? ?Anesthesia review: Yes, hx of CAD. ?Per Karoline Caldwell, PA-C, labs acceptable for surgery from 05/02/21. A1C documented by Dr. Einar Gip in [facility] note on 04/10/21. ? ?Patient denies shortness of breath, fever, cough and chest pain at PAT appointment ? ? ?All instructions explained to the patient, with a verbal understanding of the material. Patient agrees to go over the instructions while at home for a better understanding. Patient also instructed to self quarantine after being tested for COVID-19. The opportunity to ask questions was provided. ? ? ?

## 2021-05-13 NOTE — Progress Notes (Signed)
Anesthesia Chart Review: ? ?Follows with cardiologist Dr. Einar Gip for history of CAD s/p angioplasty of the LAD and stenting to mid LAD in 2014, HLD, HTN.  Last seen 10/02/2020, doing well at that time, no recurrence of angina.  Recommended annual follow-up. ? 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  Followed by Dr. Julien Nordmann.  She has undergone prior SBRT to multiple brain metastasis.  She is currently on chemotherapy with Keytruda.  Last seen 05/06/2021, stable at that time, discussed upcoming shoulder surgery. ? ?Non-insulin-dependent DM2.  Last A1c 6.8 on labs 04/07/2021. ? ?CKD 3, baseline creatinine ~1.6. ? ?CMP and CBC from 05/06/2021 reviewed, creatinine mildly elevated 1.66 consistent with history, labs otherwise unremarkable. ? ?EKG 01/28/2021: Sinus bradycardia.  Rate 55. Low voltage, precordial leads ? ?CT chest abdomen pelvis 03/03/2021: ?IMPRESSION: ?1. Stable left upper lobe nodule, solid portion measuring 7 by 6 by ?7 mm. No new nodule identified. ?2. Several small ground-glass density nodules at 6 mm or less in ?diameter, unchanged. ?3. Other imaging findings of potential clinical significance: Aortic ?Atherosclerosis (ICD10-I70.0). Coronary atherosclerosis. Localized ?anterior pericardial effusion similar to previous. Small type 1 ?hiatal hernia. Spondylosis. 7 mm degenerative anterolisthesis at ?L4-5 with bilateral foraminal impingement at L4-5 and L5-S1. ? ?Exercise tetrofosmin stress test  04/07/2020: ?Normal ECG stress. The patient exercised for 7 minutes and 58 seconds of a Bruce protocol, achieving approximately 10.11 METs.  No chest pain. NOrmap BP response. ?Myocardial perfusion is normal. ?Overall LV systolic function is normal without regional wall motion abnormalities. Stress LV EF: 64%. ?No previous exam available for comparison. Low risk . ?  ?Echocardiogram 04/09/2020:  ?Left ventricle cavity is normal in size. Moderate concentric hypertrophy   ?of the left ventricle. Normal global wall motion. Normal LV systolic  ?function with EF 61%. Doppler evidence of grade I (impaired) diastolic  ?dysfunction, normal LAP.  ?Left atrial cavity is mildly dilated.  ?Mild (Grade I) mitral regurgitation.  ?Mild tricuspid regurgitation.  ?No evidence of pulmonary hypertension.  ?Study in 2014 showed mild LVH, trace MR and TR> ? ?cardiac catheterization on 06/13/2012: High-grade 99% mid LAD stenosis, successful PTCA and stenting with 4.0 x 24 mm Veriflex non drug eluting stent. ? ? ? ?Karoline Caldwell, PA-C ?[facility] Short Stay Center/Anesthesiology ?Phone [phone] ?05/13/2021 10:22 AM ? ?

## 2021-05-13 NOTE — Anesthesia Preprocedure Evaluation (Addendum)
Anesthesia Evaluation  ?Patient identified by MRN, date of birth, ID band ?Patient awake ? ? ? ?Reviewed: ?Allergy & Precautions, NPO status , Patient's Chart, lab work & pertinent test results ? ?Airway ?Mallampati: II ? ?TM Distance: >3 FB ?Neck ROM: Full ? ? ? Dental ?no notable dental hx. ? ?  ?Pulmonary ?asthma , former smoker,  ?  ?Pulmonary exam normal ? ? ? ? ? ? ? Cardiovascular ?hypertension, Pt. on medications and Pt. on home beta blockers ?+ CAD and + Cardiac Stents  ? ?Rhythm:Regular Rate:Normal ? ? ?  ?Neuro/Psych ? Headaches, negative psych ROS  ? GI/Hepatic ?Neg liver ROS, hiatal hernia, GERD  Medicated,  ?Endo/Other  ?diabetes, Type 2, Oral Hypoglycemic Agents ? Renal/GU ?CRFRenal disease  ?negative genitourinary ?  ?Musculoskeletal ? ?(+) Arthritis ,  ? Abdominal ?Normal abdominal exam  (+)   ?Peds ? Hematology ?negative hematology ROS ?(+)   ?Anesthesia Other Findings ? ? Reproductive/Obstetrics ? ?  ? ? ? ? ? ? ? ? ? ? ? ? ? ?  ?  ? ? ? ? ? ? ?Anesthesia Physical ?Anesthesia Plan ? ?ASA: 3 ? ?Anesthesia Plan: General and Regional  ? ?Post-op Pain Management: Regional block*  ? ?Induction: Intravenous ? ?PONV Risk Score and Plan: 3 and Ondansetron, Dexamethasone, Midazolam and Treatment may vary due to age or medical condition ? ?Airway Management Planned: Mask and Oral ETT ? ?Additional Equipment: None ? ?Intra-op Plan:  ? ?Post-operative Plan: Extubation in OR ? ?Informed Consent: I have reviewed the patients History and Physical, chart, labs and discussed the procedure including the risks, benefits and alternatives for the proposed anesthesia with the patient or authorized representative who has indicated his/her understanding and acceptance.  ? ? ? ?Dental advisory given ? ?Plan Discussed with: CRNA ? ?Anesthesia Plan Comments: (PAT note by Karoline Caldwell, PA-C: ?Follows with cardiologist Dr. Einar Gip for history of CAD s/p angioplasty of the LAD and stenting to mid  LAD in 2014, HLD, HTN.  Last seen 10/02/2020, doing well at that time, no recurrence of angina.  Recommended annual follow-up. ? 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  Followed by Dr. Julien Nordmann.  She has undergone prior SBRT to multiple brain metastasis.  She is currently on chemotherapy with Keytruda.  Last seen 05/06/2021, stable at that time, discussed upcoming shoulder surgery. ? ?Non-insulin-dependent DM2.  Last A1c 6.8 on labs 04/07/2021. ? ?CKD 3, baseline creatinine ~1.6. ? ?CMP and CBC from 05/06/2021 reviewed, creatinine mildly elevated 1.66 consistent with history, labs otherwise unremarkable. ? ?EKG 01/28/2021: Sinus bradycardia.  Rate 55. Low voltage, precordial leads ? ?CT chest abdomen pelvis 03/03/2021: ?IMPRESSION: ?1. Stable left upper lobe nodule, solid portion measuring 7 by 6 by ?7 mm. No new nodule identified. ?2. Several small ground-glass density nodules at 6 mm or less in ?diameter, unchanged. ?3. Other imaging findings of potential clinical significance: Aortic ?Atherosclerosis (ICD10-I70.0). Coronary atherosclerosis. Localized ?anterior pericardial effusion similar to previous. Small type 1 ?hiatal hernia. Spondylosis. 7 mm degenerative anterolisthesis at ?L4-5 with bilateral foraminal impingement at L4-5 and L5-S1. ? ?Exercise tetrofosmin stress test ?04/07/2020: ?Normal ECG stress. The patient exercised for 7 minutes and 58 seconds of a Bruce protocol, achieving approximately 10.11 METs. ?No chest pain. NOrmap BP response. ?Myocardial perfusion is normal. ?Overall LV systolic function is normal without regional wall motion abnormalities. Stress LV EF: 64%. ?No previous exam available for comparison. Low risk . ?? ?Echocardiogram 04/09/2020:  ?Left  ventricle cavity is normal in size. Moderate concentric hypertrophy  ?of the left ventricle. Normal global wall motion. Normal LV systolic  ?function with EF 61%. Doppler evidence of grade I  (impaired) diastolic  ?dysfunction, normal LAP.  ?Left atrial cavity is mildly dilated.  ?Mild (Grade I) mitral regurgitation.  ?Mild tricuspid regurgitation.  ?No evidence of pulmonary hypertension.  ?Study in 2014 showed mild LVH, trace MR and TR> ? ?cardiac catheterization on 06/13/2012:?High-grade 99% mid LAD stenosis, successful PTCA and stenting with 4.0 x 24 mm Veriflex non drug eluting stent. ? ? ?)  ? ? ? ? ? ?Anesthesia Quick Evaluation ? ?

## 2021-05-14 ENCOUNTER — Telehealth: Payer: Self-pay | Admitting: Orthopedic Surgery

## 2021-05-14 ENCOUNTER — Ambulatory Visit (HOSPITAL_COMMUNITY)
Admission: RE | Admit: 2021-05-14 | Discharge: 2021-05-14 | Disposition: A | Discharge status: Home / Self Care | Payer: Medicare Other | Attending: Orthopedic Surgery | Admitting: Orthopedic Surgery

## 2021-05-14 ENCOUNTER — Encounter (HOSPITAL_COMMUNITY)
Admission: RE | Disposition: A | Discharge status: Home / Self Care | Payer: Self-pay | Source: Home / Self Care | Attending: Orthopedic Surgery

## 2021-05-14 ENCOUNTER — Ambulatory Visit (HOSPITAL_COMMUNITY): Payer: Medicare Other | Admitting: Physician Assistant

## 2021-05-14 ENCOUNTER — Other Ambulatory Visit: Payer: Self-pay | Admitting: Orthopedic Surgery

## 2021-05-14 ENCOUNTER — Encounter (HOSPITAL_COMMUNITY): Payer: Self-pay | Admitting: Orthopedic Surgery

## 2021-05-14 ENCOUNTER — Other Ambulatory Visit: Payer: Self-pay

## 2021-05-14 ENCOUNTER — Ambulatory Visit (HOSPITAL_BASED_OUTPATIENT_CLINIC_OR_DEPARTMENT_OTHER): Payer: Medicare Other | Admitting: Physician Assistant

## 2021-05-14 DIAGNOSIS — E1122 Type 2 diabetes mellitus with diabetic chronic kidney disease: Secondary | ICD-10-CM | POA: Diagnosis not present

## 2021-05-14 DIAGNOSIS — R911 Solitary pulmonary nodule: Secondary | ICD-10-CM | POA: Diagnosis not present

## 2021-05-14 DIAGNOSIS — Z87891 Personal history of nicotine dependence: Secondary | ICD-10-CM | POA: Insufficient documentation

## 2021-05-14 DIAGNOSIS — M75122 Complete rotator cuff tear or rupture of left shoulder, not specified as traumatic: Secondary | ICD-10-CM

## 2021-05-14 DIAGNOSIS — N189 Chronic kidney disease, unspecified: Secondary | ICD-10-CM | POA: Insufficient documentation

## 2021-05-14 DIAGNOSIS — C3490 Malignant neoplasm of unspecified part of unspecified bronchus or lung: Secondary | ICD-10-CM | POA: Diagnosis not present

## 2021-05-14 DIAGNOSIS — Z7984 Long term (current) use of oral hypoglycemic drugs: Secondary | ICD-10-CM | POA: Diagnosis not present

## 2021-05-14 DIAGNOSIS — I129 Hypertensive chronic kidney disease with stage 1 through stage 4 chronic kidney disease, or unspecified chronic kidney disease: Secondary | ICD-10-CM | POA: Insufficient documentation

## 2021-05-14 DIAGNOSIS — K219 Gastro-esophageal reflux disease without esophagitis: Secondary | ICD-10-CM | POA: Insufficient documentation

## 2021-05-14 DIAGNOSIS — M75102 Unspecified rotator cuff tear or rupture of left shoulder, not specified as traumatic: Secondary | ICD-10-CM | POA: Diagnosis not present

## 2021-05-14 DIAGNOSIS — M7522 Bicipital tendinitis, left shoulder: Secondary | ICD-10-CM

## 2021-05-14 DIAGNOSIS — X58XXXA Exposure to other specified factors, initial encounter: Secondary | ICD-10-CM | POA: Diagnosis not present

## 2021-05-14 DIAGNOSIS — S46112A Strain of muscle, fascia and tendon of long head of biceps, left arm, initial encounter: Secondary | ICD-10-CM | POA: Insufficient documentation

## 2021-05-14 DIAGNOSIS — C801 Malignant (primary) neoplasm, unspecified: Secondary | ICD-10-CM | POA: Diagnosis not present

## 2021-05-14 DIAGNOSIS — Z955 Presence of coronary angioplasty implant and graft: Secondary | ICD-10-CM | POA: Insufficient documentation

## 2021-05-14 DIAGNOSIS — S46212A Strain of muscle, fascia and tendon of other parts of biceps, left arm, initial encounter: Secondary | ICD-10-CM | POA: Diagnosis not present

## 2021-05-14 DIAGNOSIS — S43432A Superior glenoid labrum lesion of left shoulder, initial encounter: Secondary | ICD-10-CM | POA: Diagnosis not present

## 2021-05-14 DIAGNOSIS — J45909 Unspecified asthma, uncomplicated: Secondary | ICD-10-CM | POA: Insufficient documentation

## 2021-05-14 DIAGNOSIS — C7931 Secondary malignant neoplasm of brain: Secondary | ICD-10-CM | POA: Insufficient documentation

## 2021-05-14 DIAGNOSIS — Z79899 Other long term (current) drug therapy: Secondary | ICD-10-CM | POA: Insufficient documentation

## 2021-05-14 DIAGNOSIS — E119 Type 2 diabetes mellitus without complications: Secondary | ICD-10-CM | POA: Insufficient documentation

## 2021-05-14 DIAGNOSIS — K5710 Diverticulosis of small intestine without perforation or abscess without bleeding: Secondary | ICD-10-CM | POA: Diagnosis not present

## 2021-05-14 DIAGNOSIS — Z01818 Encounter for other preprocedural examination: Secondary | ICD-10-CM

## 2021-05-14 DIAGNOSIS — I2510 Atherosclerotic heart disease of native coronary artery without angina pectoris: Secondary | ICD-10-CM | POA: Diagnosis not present

## 2021-05-14 DIAGNOSIS — G8918 Other acute postprocedural pain: Secondary | ICD-10-CM | POA: Diagnosis not present

## 2021-05-14 HISTORY — PX: SHOULDER ARTHROSCOPY WITH ROTATOR CUFF REPAIR AND SUBACROMIAL DECOMPRESSION: SHX5686

## 2021-05-14 LAB — GLUCOSE, CAPILLARY
Glucose-Capillary: 116 mg/dL — ABNORMAL HIGH (ref 70–99)
Glucose-Capillary: 118 mg/dL — ABNORMAL HIGH (ref 70–99)
Glucose-Capillary: 110 mg/dL — ABNORMAL HIGH (ref 70–99)

## 2021-05-14 SURGERY — SHOULDER ARTHROSCOPY WITH ROTATOR CUFF REPAIR AND SUBACROMIAL DECOMPRESSION
Anesthesia: Regional | Site: Shoulder | Laterality: Left

## 2021-05-14 MED ORDER — FENTANYL CITRATE (PF) 100 MCG/2ML IJ SOLN: 50.0000 ug | Freq: Once | INTRAMUSCULAR | Status: AC

## 2021-05-14 MED ORDER — OXYCODONE HCL 5 MG PO TABS: 5.0000 mg | ORAL_TABLET | Freq: Four times a day (QID) | ORAL | 0 refills | Status: DC | PRN

## 2021-05-14 MED ORDER — ROCURONIUM BROMIDE 10 MG/ML (PF) SYRINGE
PREFILLED_SYRINGE | INTRAVENOUS | Status: DC | PRN
  Administered 2021-05-14: 20 mg via INTRAVENOUS
  Administered 2021-05-14: 60 mg via INTRAVENOUS

## 2021-05-14 MED ORDER — ORAL CARE MOUTH RINSE: 15.0000 mL | Freq: Once | OROMUCOSAL | Status: AC

## 2021-05-14 MED ORDER — POVIDONE-IODINE 10 % EX SWAB
2.0000 "application " | Freq: Once | CUTANEOUS | Status: AC
  Administered 2021-05-14: 2 via TOPICAL

## 2021-05-14 MED ORDER — EPINEPHRINE PF 1 MG/ML IJ SOLN
INTRAMUSCULAR | Status: DC | PRN
  Administered 2021-05-14: 1 mg

## 2021-05-14 MED ORDER — LACTATED RINGERS IV SOLN: INTRAVENOUS | Status: DC

## 2021-05-14 MED ORDER — CHLORHEXIDINE GLUCONATE 0.12 % MT SOLN
15.0000 mL | Freq: Once | OROMUCOSAL | Status: AC
  Administered 2021-05-14: 15 mL via OROMUCOSAL
  Filled 2021-05-14: qty 15

## 2021-05-14 MED ORDER — METHOCARBAMOL 500 MG PO TABS: 500.0000 mg | ORAL_TABLET | Freq: Three times a day (TID) | ORAL | 0 refills | Status: AC | PRN

## 2021-05-14 MED ORDER — SODIUM CHLORIDE 0.9 % IR SOLN
Status: DC | PRN
  Administered 2021-05-14: 3000 mL

## 2021-05-14 MED ORDER — DEXAMETHASONE SODIUM PHOSPHATE 10 MG/ML IJ SOLN
INTRAMUSCULAR | Status: DC | PRN
  Administered 2021-05-14: 10 mg via INTRAVENOUS

## 2021-05-14 MED ORDER — SUGAMMADEX SODIUM 200 MG/2ML IV SOLN
INTRAVENOUS | Status: DC | PRN
  Administered 2021-05-14: 328.4 mg via INTRAVENOUS

## 2021-05-14 MED ORDER — ONDANSETRON HCL 4 MG/2ML IJ SOLN
INTRAMUSCULAR | Status: DC | PRN
  Administered 2021-05-14: 4 mg via INTRAVENOUS

## 2021-05-14 MED ORDER — PHENYLEPHRINE HCL-NACL 20-0.9 MG/250ML-% IV SOLN
INTRAVENOUS | Status: DC | PRN
  Administered 2021-05-14: 25 ug/min via INTRAVENOUS

## 2021-05-14 MED ORDER — FENTANYL CITRATE (PF) 100 MCG/2ML IJ SOLN
INTRAMUSCULAR | Status: AC
  Administered 2021-05-14: 50 ug via INTRAVENOUS
  Filled 2021-05-14: qty 2

## 2021-05-14 MED ORDER — LIDOCAINE 2% (20 MG/ML) 5 ML SYRINGE
INTRAMUSCULAR | Status: DC | PRN
  Administered 2021-05-14: 60 mg via INTRAVENOUS

## 2021-05-14 MED ORDER — TRANEXAMIC ACID-NACL 1000-0.7 MG/100ML-% IV SOLN
INTRAVENOUS | Status: AC
  Filled 2021-05-14: qty 100

## 2021-05-14 MED ORDER — MIDAZOLAM HCL 2 MG/2ML IJ SOLN
INTRAMUSCULAR | Status: AC
  Administered 2021-05-14: 1 mg via INTRAVENOUS
  Filled 2021-05-14: qty 2

## 2021-05-14 MED ORDER — MIDAZOLAM HCL 2 MG/2ML IJ SOLN: 1.0000 mg | Freq: Once | INTRAMUSCULAR | Status: AC

## 2021-05-14 MED ORDER — BUPIVACAINE LIPOSOME 1.3 % IJ SUSP
INTRAMUSCULAR | Status: DC | PRN
  Administered 2021-05-14: 10 mL via PERINEURAL

## 2021-05-14 MED ORDER — TRANEXAMIC ACID-NACL 1000-0.7 MG/100ML-% IV SOLN
INTRAVENOUS | Status: DC | PRN
  Administered 2021-05-14: 1000 mg via INTRAVENOUS

## 2021-05-14 MED ORDER — PROPOFOL 10 MG/ML IV BOLUS
INTRAVENOUS | Status: DC | PRN
  Administered 2021-05-14: 120 mg via INTRAVENOUS

## 2021-05-14 MED ORDER — POVIDONE-IODINE 7.5 % EX SOLN: Freq: Once | CUTANEOUS | Status: DC

## 2021-05-14 MED ORDER — BUPIVACAINE HCL (PF) 0.5 % IJ SOLN
INTRAMUSCULAR | Status: DC | PRN
  Administered 2021-05-14: 15 mL via PERINEURAL

## 2021-05-14 MED ORDER — CEFAZOLIN SODIUM-DEXTROSE 2-4 GM/100ML-% IV SOLN
2.0000 g | INTRAVENOUS | Status: AC
  Administered 2021-05-14: 2 g via INTRAVENOUS
  Filled 2021-05-14: qty 100

## 2021-05-14 MED ORDER — INSULIN ASPART 100 UNIT/ML IJ SOLN: 0.0000 [IU] | INTRAMUSCULAR | Status: DC | PRN

## 2021-05-14 SURGICAL SUPPLY — 69 items
AID PSTN UNV HD RSTRNT DISP (MISCELLANEOUS) ×1
ALCOHOL 70% 16 OZ (MISCELLANEOUS) ×2 IMPLANT
ANCH SUT 2 FBRTK KNTLS 1.8 (Anchor) ×1 IMPLANT
ANCH SUT 2 SWLK 19.1 CLS EYLT (Anchor) ×3 IMPLANT
ANCH SUT CRKSW FT 1.3X (Anchor) ×3 IMPLANT
ANCH SUT FBRTK 1.3 2 TPE (Anchor) ×1 IMPLANT
ANCHOR FBRTK 2.6 SUTURETAP 1.3 (Anchor) ×1 IMPLANT
ANCHOR SUT 1.8 FIBERTAK SB KL (Anchor) ×1 IMPLANT
ANCHOR SUT BIOCOMP CORKSREW (Anchor) ×3 IMPLANT
ANCHOR SWIVELOCK BIO 4.75X19.1 (Anchor) ×3 IMPLANT
BAG COUNTER SPONGE SURGICOUNT (BAG) ×2 IMPLANT
BAG SPNG CNTER NS LX DISP (BAG) ×1
BLADE EXCALIBUR 4.0X13 (MISCELLANEOUS) ×1 IMPLANT
BLADE SURG 11 STRL SS (BLADE) ×1 IMPLANT
COOLER ICEMAN CLASSIC (MISCELLANEOUS) ×1 IMPLANT
COVER SURGICAL LIGHT HANDLE (MISCELLANEOUS) ×2 IMPLANT
DRAPE INCISE IOBAN 66X45 STRL (DRAPES) ×4 IMPLANT
DRAPE STERI 35X30 U-POUCH (DRAPES) ×2 IMPLANT
DRAPE U-SHAPE 47X51 STRL (DRAPES) ×4 IMPLANT
DRSG TEGADERM 4X4.75 (GAUZE/BANDAGES/DRESSINGS) ×6 IMPLANT
DURAPREP 26ML APPLICATOR (WOUND CARE) ×2 IMPLANT
ELECT REM PT RETURN 9FT ADLT (ELECTROSURGICAL) ×2
ELECTRODE REM PT RTRN 9FT ADLT (ELECTROSURGICAL) ×1 IMPLANT
GAUZE SPONGE 4X4 12PLY STRL (GAUZE/BANDAGES/DRESSINGS) ×2 IMPLANT
GAUZE XEROFORM 1X8 LF (GAUZE/BANDAGES/DRESSINGS) ×2 IMPLANT
GLOVE SRG 8 PF TXTR STRL LF DI (GLOVE) ×1 IMPLANT
GLOVE SURG LTX SZ7 (GLOVE) ×2 IMPLANT
GLOVE SURG LTX SZ8 (GLOVE) ×2 IMPLANT
GLOVE SURG UNDER POLY LF SZ7 (GLOVE) ×2 IMPLANT
GLOVE SURG UNDER POLY LF SZ8 (GLOVE) ×2
GOWN STRL REUS W/ TWL LRG LVL3 (GOWN DISPOSABLE) ×3 IMPLANT
GOWN STRL REUS W/TWL LRG LVL3 (GOWN DISPOSABLE) ×6
KIT BASIN OR (CUSTOM PROCEDURE TRAY) ×2 IMPLANT
KIT STR SPEAR 1.8 FBRTK DISP (KITS) ×1 IMPLANT
KIT TURNOVER KIT B (KITS) ×2 IMPLANT
MANIFOLD NEPTUNE II (INSTRUMENTS) ×2 IMPLANT
NDL HYPO 25X1 1.5 SAFETY (NEEDLE) ×1 IMPLANT
NDL SCORPION MULTI FIRE (NEEDLE) IMPLANT
NDL SPNL 18GX3.5 QUINCKE PK (NEEDLE) ×1 IMPLANT
NEEDLE HYPO 25X1 1.5 SAFETY (NEEDLE) ×2 IMPLANT
NEEDLE SCORPION MULTI FIRE (NEEDLE) ×4 IMPLANT
NEEDLE SPNL 18GX3.5 QUINCKE PK (NEEDLE) ×2 IMPLANT
NS IRRIG 1000ML POUR BTL (IV SOLUTION) ×2 IMPLANT
PACK SHOULDER (CUSTOM PROCEDURE TRAY) ×2 IMPLANT
PAD ARMBOARD 7.5X6 YLW CONV (MISCELLANEOUS) ×4 IMPLANT
PAD COLD SHLDR WRAP-ON (PAD) ×1 IMPLANT
PORT APPOLLO RF 90DEGREE MULTI (SURGICAL WAND) ×1 IMPLANT
RESTRAINT HEAD UNIVERSAL NS (MISCELLANEOUS) ×2 IMPLANT
SLING ARM IMMOBILIZER LRG (SOFTGOODS) ×1 IMPLANT
SOL PREP POV-IOD 4OZ 10% (MISCELLANEOUS) ×4 IMPLANT
SPONGE T-LAP 4X18 ~~LOC~~+RFID (SPONGE) ×4 IMPLANT
STRIP CLOSURE SKIN 1/2X4 (GAUZE/BANDAGES/DRESSINGS) ×2 IMPLANT
SUCTION FRAZIER HANDLE 10FR (MISCELLANEOUS) ×2
SUCTION TUBE FRAZIER 10FR DISP (MISCELLANEOUS) ×1 IMPLANT
SUT ETHILON 3 0 PS 1 (SUTURE) ×3 IMPLANT
SUT MNCRL AB 3-0 PS2 18 (SUTURE) ×2 IMPLANT
SUT VIC AB 0 CT1 27 (SUTURE) ×2
SUT VIC AB 0 CT1 27XBRD ANBCTR (SUTURE) IMPLANT
SUT VIC AB 1 CT1 27 (SUTURE) ×4
SUT VIC AB 1 CT1 27XBRD ANBCTR (SUTURE) ×1 IMPLANT
SUT VIC AB 2-0 CT1 27 (SUTURE) ×4
SUT VIC AB 2-0 CT1 TAPERPNT 27 (SUTURE) ×1 IMPLANT
SUT VICRYL 0 UR6 27IN ABS (SUTURE) ×11 IMPLANT
SYR 30ML LL (SYRINGE) ×2 IMPLANT
SYS FBRTK BUTTON 2.6 (Anchor) ×2 IMPLANT
SYSTEM FBRTK BUTTON 2.6 (Anchor) IMPLANT
TOWEL GREEN STERILE (TOWEL DISPOSABLE) ×2 IMPLANT
TOWEL GREEN STERILE FF (TOWEL DISPOSABLE) ×2 IMPLANT
TUBING ARTHROSCOPY IRRIG 16FT (MISCELLANEOUS) ×2 IMPLANT

## 2021-05-14 NOTE — Anesthesia Procedure Notes (Signed)
Procedure Name: Intubation ?Date/Time: 05/14/2021 9:50 AM ?Performed by: Minerva Ends, CRNA ?Pre-anesthesia Checklist: Patient identified, Emergency Drugs available, Suction available and Patient being monitored ?Patient Re-evaluated:Patient Re-evaluated prior to induction ?Oxygen Delivery Method: Circle system utilized ?Preoxygenation: Pre-oxygenation with 100% oxygen ?Induction Type: IV induction ?Ventilation: Mask ventilation without difficulty ?Laryngoscope Size: Mac and 3 ?Grade View: Grade I ?Tube type: Oral ?Tube size: 7.0 mm ?Number of attempts: 1 ?Airway Equipment and Method: Stylet and Oral airway ?Placement Confirmation: ETT inserted through vocal cords under direct vision, positive ETCO2 and breath sounds checked- equal and bilateral ?Secured at: 22 cm ?Tube secured with: Tape ?Dental Injury: Teeth and Oropharynx as per pre-operative assessment  ? ? ? ? ?

## 2021-05-14 NOTE — Anesthesia Procedure Notes (Signed)
Anesthesia Regional Block: Interscalene brachial plexus block  ? ?Pre-Anesthetic Checklist: , timeout performed,  Correct Patient, Correct Site, Correct Laterality,  Correct Procedure, Correct Position, site marked,  Risks and benefits discussed,  Surgical consent,  Pre-op evaluation,  At surgeon's request and post-op pain management ? ?Laterality: Left ? ?Prep: Dura Prep     ?  ?Needles:  ?Injection technique: Single-shot ? ?Needle Type: Echogenic Stimulator Needle   ? ? ?Needle Length: 5cm  ?Needle Gauge: 20  ? ? ? ?Additional Needles: ? ? ?Procedures:,,,, ultrasound used (permanent image in chart),,    ?Narrative:  ?Start time: 05/14/2021 9:00 AM ?End time: 05/14/2021 9:05 AM ?Injection made incrementally with aspirations every 5 mL. ? ?Performed by: Personally  ?Anesthesiologist: Darral Dash, DO ? ?Additional Notes: ?Patient identified. Risks/Benefits/Options discussed with patient including but not limited to bleeding, infection, nerve damage, failed block, incomplete pain control. Patient expressed understanding and wished to proceed. All questions were answered. Sterile technique was used throughout the entire procedure. Please see nursing notes for vital signs. Aspirated in 5cc intervals with injection for negative confirmation. Patient was given instructions on fall risk and not to get out of bed. All questions and concerns addressed with instructions to call with any issues or inadequate analgesia.   ?  ? ? ? ? ?

## 2021-05-14 NOTE — Telephone Encounter (Signed)
IC provided dx code and surgery performed ?

## 2021-05-14 NOTE — H&P (Signed)
Carolyn [name] is an 66 y.o. female.   ?Chief Complaint: left shoulder pain ?HPI: Patient presents for evaluation of left shoulder pain.  Since she was last seen she has had an MRI scan which shows complete tears of the infraspinatus and supraspinatus with 2.5 cm of retraction.  Patient is undergoing treatment for metastatic cancer with chemotherapy every 3 weeks.  She is not really functional at all overhead and is having pain  ? ?Past Medical History:  ?Diagnosis Date  ? Anginal pain ([place])   ? " with exertion "  ? Arthritis   ? " 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 "  ? Asthma   ? h/o  ? Brain metastases 08/2018  ? Chronic kidney disease   ? Coronary artery disease   ? Diabetes mellitus without complication ([place])   ? Type II  ? GERD (gastroesophageal reflux disease)   ? H/O hiatal hernia   ? Heart murmur   ? History of radiation therapy 09/22/2018  ? stereotactic radiosurgery   ? HPV in female   ? h/o  ? Hypertension   ? nscl ca dx'd 08/07/18  ? Persistent headaches   ? h/o  ? Pneumonia   ? hx of PNA  ? Shortness of breath   ? Vaginal dryness   ? h/o  ? ? ?Past Surgical History:  ?Procedure Laterality Date  ? ABDOMINAL HYSTERECTOMY    ? bone spur    ? CARDIAC CATHETERIZATION  06/13/2012  ? carpel tunnel surgery Left   ? COLONOSCOPY    ? 9 years ago  ? CORONARY ANGIOPLASTY  06/13/2012  ? EYE SURGERY    ? cyst left eye   ? IR IMAGING GUIDED PORT INSERTION  10/10/2018  ? LEFT HEART CATHETERIZATION WITH CORONARY ANGIOGRAM N/A 06/13/2012  ? Procedure: LEFT HEART CATHETERIZATION WITH CORONARY ANGIOGRAM;  Surgeon: Laverda Page, MD;  Location: [facility] CATH LAB;  Service: Cardiovascular;  Laterality: N/A;  ? NECK SURGERY    ? rotator cuff surgery Right 2015  ? VIDEO BRONCHOSCOPY WITH ENDOBRONCHIAL NAVIGATION N/A 09/06/2018  ? Procedure: VIDEO BRONCHOSCOPY WITH ENDOBRONCHIAL NAVIGATION, left upper lung;  Surgeon: Lajuana Matte, MD;  Location: [facility];  Service: Thoracic;  Laterality: N/A;  ? VIDEO BRONCHOSCOPY WITH ENDOBRONCHIAL ULTRASOUND Left  09/06/2018  ? Procedure: VIDEO BRONCHOSCOPY WITH ENDOBRONCHIAL ULTRASOUND, left lung;  Surgeon: Lajuana Matte, MD;  Location: [facility];  Service: Thoracic;  Laterality: Left;  ? WISDOM TOOTH EXTRACTION    ? ? ?Family History  ?Problem Relation Age of Onset  ? Breast cancer Other   ? Diabetes Mother   ? Glaucoma Mother   ? Hypertension Mother   ? Hypertension Father   ? Asthma Father   ? Diabetes Sister   ? Diabetes Brother   ? Hypertension Sister   ? ?Social History:  reports that she quit smoking about 22 years ago. Her smoking use included cigarettes. She has a 15.00 pack-year smoking history. She has never used smokeless tobacco. She reports that she does not currently use alcohol. She reports that she does not currently use drugs. ? ?Allergies:  ?Allergies  ?Allergen Reactions  ? Atorvastatin Other (See Comments)  ?  Severe myalgia  ? Crestor [Rosuvastatin] Other (See Comments)  ?  Severe myalgias  ? Lisinopril Swelling  ?  Lips and face swelled up.  ? ? ?Medications Prior to Admission  ?Medication Sig Dispense Refill  ? acetaminophen (TYLENOL) 500 MG tablet Take 1,000 mg by mouth every 8 (eight) hours  as needed for mild pain, fever or headache.     ? amLODipine (NORVASC) 10 MG tablet Take 1 tablet by mouth once daily (Patient taking differently: Take 10 mg by mouth daily with supper.) 90 tablet 0  ? aspirin EC 81 MG tablet Take 81 mg by mouth daily.    ? carvedilol (COREG) 25 MG tablet Take 1/2 (one-half) tablet by mouth twice daily 180 tablet 0  ? Cholecalciferol (VITAMIN D-3) 125 MCG (5000 UT) TABS Take 5,000 Units by mouth daily.    ? Chromium Picolinate 1000 MCG TABS Take 1,000 mg by mouth daily.    ? empagliflozin (JARDIANCE) 25 MG TABS tablet Take 1 tablet (25 mg total) by mouth daily before breakfast. 90 tablet 3  ? esomeprazole (NEXIUM) 20 MG capsule Take 20 mg by mouth daily with breakfast.    ? ezetimibe (ZETIA) 10 MG tablet TAKE 1 TABLET BY MOUTH ONCE DAILY AFTER SUPPER 90 tablet 3  ? fexofenadine  (ALLEGRA) 180 MG tablet Take 180 mg by mouth daily.    ? fluticasone (FLONASE) 50 MCG/ACT nasal spray Place 1 spray into both nostrils daily as needed for allergies or rhinitis.    ? levocetirizine (XYZAL) 5 MG tablet Take 5 mg by mouth daily as needed for allergies.    ? lidocaine-prilocaine (EMLA) cream Apply to the Port-A-Cath site 30 to 60 minutes before chemotherapy. 30 g 2  ? metFORMIN (GLUCOPHAGE) 500 MG tablet Take 500 mg by mouth daily with supper.    ? montelukast (SINGULAIR) 10 MG tablet Take 10 mg by mouth daily as needed (allergies).    ? nitroGLYCERIN (NITROSTAT) 0.4 MG SL tablet Place 1 tablet (0.4 mg total) under the tongue every 5 (five) minutes as needed for chest pain. 25 tablet 2  ? pentoxifylline (TRENTAL) 400 MG CR tablet Take 400mg  tab daily x 1 week, then 400mg  BID; take with food (Patient taking differently: 400 mg 2 (two) times daily with breakfast and lunch.) 60 tablet 5  ? Pitavastatin Calcium (LIVALO) 4 MG TABS Take 1 tablet (4 mg total) by mouth at bedtime. 90 tablet 1  ? Polyethyl Glycol-Propyl Glycol (SYSTANE) 0.4-0.3 % GEL ophthalmic gel Place 1 application. into both eyes every 6 (six) hours as needed (dry eyes).    ? telmisartan (MICARDIS) 40 MG tablet Take 40 mg by mouth daily.    ? vitamin E 180 MG (400 UNITS) capsule Take 400 IU daily x 1 week, then 400 IU BID (Patient taking differently: Take 400 Units by mouth in the morning and at bedtime.) 60 capsule 5  ? Lancets (ONETOUCH DELICA PLUS GGYIRS85I) MISC SMARTSIG:1 Topical 3 Times Daily    ? ONETOUCH ULTRA test strip USE 1 STRIP TO CHECK GLUCOSE THREE TIMES DAILY    ? ? ?Results for orders placed or performed during the hospital encounter of 05/14/21 (from the past 48 hour(s))  ?Glucose, capillary     Status: Abnormal  ? Collection Time: 05/14/21  7:31 AM  ?Result Value Ref Range  ? Glucose-Capillary 116 (H) 70 - 99 mg/dL  ?  Comment: Glucose reference range applies only to samples taken after fasting for at least 8 hours.   ? ?No results found. ? ?Review of Systems  ?Musculoskeletal:  Positive for arthralgias.  ?All other systems reviewed and are negative. ? ?Blood pressure (!) 149/78, pulse (!) 58, temperature 98.4 ?F (36.9 ?C), temperature source Oral, resp. rate 17, height 5\' 1"  (1.549 m), weight 82.1 kg, SpO2 99 %. ?Physical Exam ?Vitals  reviewed.  ?HENT:  ?   Head: Normocephalic.  ?   Nose: Nose normal.  ?   Mouth/Throat:  ?   Mouth: Mucous membranes are moist.  ?Eyes:  ?   Pupils: Pupils are equal, round, and reactive to light.  ?Cardiovascular:  ?   Pulses: Normal pulses.  ?Pulmonary:  ?   Effort: Pulmonary effort is normal.  ?Abdominal:  ?   General: Abdomen is flat.  ?Skin: ?   General: Skin is warm.  ?   Capillary Refill: Capillary refill takes less than 2 seconds.  ?Neurological:  ?   General: No focal deficit present.  ?   Mental Status: She is alert.  ?Psychiatric:     ?   Mood and Affect: Mood normal.  ?  Ortho exam demonstrates good cervical spine range of motion.  She does not have active forward flexion and abduction above 90 degrees but it is slightly better than it was last visit.  Does have weakness to infraspinatus and supraspinatus testing on the left but subscap strength is intact.  No Popeye deformity in the left shoulder.  No other masses lymphadenopathy or skin changes noted in that shoulder girdle region ? ?Assessment/Plan ? Impression is retracted rotator cuff tears which do appear to be repairable at this time.  The rigorous nature of the rehabilitative process is discussed with the patient.  Without surgical intervention think it is unlikely she will regain much function overhead.   Anticipate that within the reasonably quick timeframe of this injury that the tendon should be mobile.  Patient understands the risk and benefits of surgery particularly with chemotherapy lowering her immune status and the slightly increased risk of infection.  We would use postop brace versus CPM machine to facilitate return  of range of motion.  All questions answered ?  ? ?Anderson Malta, MD ?05/14/2021, 7:44 AM ? ? ? ?

## 2021-05-14 NOTE — Brief Op Note (Signed)
? ?  05/14/2021 ? ?12:45 PM ? ?PATIENT:  Carolyn [name]  66 y.o. female ? ?PRE-OPERATIVE DIAGNOSIS:  left shoulder rotator cuff tears ? ?POST-OPERATIVE DIAGNOSIS:  left shoulder rotator cuff tears ?Biceps tendinitis, tears of the supraspinatus and infraspinatus with retraction ?PROCEDURE:  Procedure(s): ?LEFT SHOULDER ARTHROSCOPY, SUBACROMIAL DECOMPRESSION, BICEPS TENODESIS, MINI OPEN ROTATOR CUFF REPAIR ? ?SURGEON:  Surgeon(s): ?Meredith Pel, MD ? ?ASSISTANT: Joey  RNFA ? ?ANESTHESIA:   general ? ?EBL: 25 ml   ? ?Total I/O ?In: 1000 [I.V.:1000] ?Out: 50 [Blood:50] ? ?BLOOD ADMINISTERED: none ? ?DRAINS: none  ? ?LOCAL MEDICATIONS USED:  none ? ?SPECIMEN:  No Specimen ? ?COUNTS:  YES ? ?TOURNIQUET:  * No tourniquets in log * ? ?DICTATION: .Other Dictation: Dictation Number [id number] ? ?PLAN OF CARE: Discharge to home after PACU ? ?PATIENT DISPOSITION:  PACU - hemodynamically stable ? ? ? ? ? ? ? ? ? ? ? ? ?  ?

## 2021-05-14 NOTE — Telephone Encounter (Signed)
Received call from [facility] with [facility] needing to know diagnosis and and if patient had surgery. [facility] also need to know what type of surgery the patient had. The number to contact [place] is [phone]   ?

## 2021-05-14 NOTE — Transfer of Care (Signed)
Immediate Anesthesia Transfer of Care Note ? ?Patient: XYLAH EARLY ? ?Procedure(s) Performed: LEFT SHOULDER ARTHROSCOPY, SUBACROMIAL DECOMPRESSION, BICEPS TENODESIS, MINI OPEN ROTATOR CUFF REPAIR (Left: Shoulder) ? ?Patient Location: PACU ? ?Anesthesia Type:GA combined with regional for post-op pain ? ?Level of Consciousness: awake, alert  and oriented ? ?Airway & Oxygen Therapy: Patient Spontanous Breathing ? ?Post-op Assessment: Report given to RN and Post -op Vital signs reviewed and stable ? ?Post vital signs: Reviewed and stable ? ?Last Vitals:  ?Vitals Value Taken Time  ?BP 126/65 05/14/21 1244  ?Temp    ?Pulse 56 05/14/21 1245  ?Resp 16 05/14/21 1245  ?SpO2 98 % 05/14/21 1245  ?Vitals shown include unvalidated device data. ? ?Last Pain:  ?Vitals:  ? 05/14/21 0836  ?TempSrc:   ?PainSc: 4   ?   ? ?Patients Stated Pain Goal: 3 (05/14/21 1003) ? ?Complications: No notable events documented. ?

## 2021-05-15 ENCOUNTER — Encounter (HOSPITAL_COMMUNITY): Payer: Self-pay | Admitting: Orthopedic Surgery

## 2021-05-15 NOTE — Op Note (Signed)
NAME: Carolyn [name]. ?MEDICAL RECORD NO: [id number] ?ACCOUNT NO: [id number] ?DATE OF BIRTH: Feb 11, 1955 ?FACILITY: [facility] ?LOCATION: [facility] ?PHYSICIAN: Yetta Barre. Marlou Sa, MD ? ?Operative Report  ? ?DATE OF PROCEDURE: 05/14/2021 ? ? ?PREOPERATIVE DIAGNOSIS:  Left shoulder rotator cuff tear, supraspinatus, and infraspinatus with retraction and biceps tearing. ? ?POSTOPERATIVE DIAGNOSES:  Left shoulder rotator cuff tear, supraspinatus, and infraspinatus with retraction and biceps tearing. ? ?PROCEDURE:  Left shoulder arthroscopy with limited debridement of the superior labrum with mini open rotator cuff tear repair of the supraspinatus, and infraspinatus and biceps tenodesis. ? ?SURGEON:  Yetta Barre. Marlou Sa, MD ? ?ASSISTANT:  JOEY RNFA. ? ?INDICATIONS: This is a 66 year old patient with severe left shoulder pain following injury several months ago.  She has a rotator cuff tear with some retraction of the infraspinatus and supraspinatus.  She presents now for operative management after  ?explanation of risks and benefits. ? ?DESCRIPTION OF PROCEDURE:  The patient was brought to the operating room where general endotracheal anesthesia was induced.  Preoperative antibiotics administered.  Timeout was called.  The patient was placed in the beach chair position with the head in  ?neutral position.  Left arm was examined under anesthesia.  Found to have passive range of motion of 70/100/170.  The left shoulder was then prescrubbed with alcohol and Betadine, allowed to air dry, prepped with DuraPrep solution and draped in sterile  ?manner.  Charlie Pitter was used to seal the operative field and cover the axilla.  Timeout was called.  Posterior portal was created 2 cm medial and inferior to the posterolateral margin of the acromion.  Diagnostic arthroscopy was performed.  Anterior portal  ?created under direct visualization.  Diagnostic arthroscopy demonstrated intact glenohumeral articular surface, there was, however, a small area  measuring 8 x 8 mm of partial thickness chondromalacia of the humeral head where the biceps fraying was  ?present.  This was likely from the partial thickness biceps tendinitis.  Superior labrum had degeneration and the biceps tendon had significant partial tearing.  Superior labrum was debrided and the biceps tendon was released.  Anterior, inferior and  ?posterior inferior glenohumeral ligaments were intact.  Next, the instruments were removed.  Portals were closed using 3-0 nylon.  Ioban then used to cover the entire operative field.  Incision made off the anterolateral margin of the acromion. Deltoid  ?was split, it measured distance of 4 cm, marked with a #1 Vicryl suture.  Bursectomy performed.  At this time, the self-retaining Arthrex retractor was placed and the bicipital groove was opened on the medial aspect.  Biceps tendon was then tenodesed  ?into the groove using 2 knotless suture anchors from Arthrex, one in the distal aspect, one on the proximal aspect of the groove.  It should be noted that the biceps tendon was grasped with a FiberLoop suture, which was incorporated into the superior  ?suture anchor.  Fixation was achieved under appropriate tension oversewn with 3-0 Vicryl sutures.  Next, attention was directed towards the rotator cuff tear.  Acromioplasty performed.  CA ligament maintained.  Traction sutures were placed into the  ?supraspinatus and infraspinatus, rotator cuff tendons which had good mobility, 7-0 Vicryl grasping sutures were placed at the leading edge of the tendon.  At this time, the footprint was debrided and bleeding bone was created for the attachments surface. ?  Next 3 double loaded Arthrex medial row anchors with 2 suture tapes each were placed at the junction of the tuberosity and the articular surface. Using the [facility]  suture passer the 12 SutureTapes were passed from posterior to anterior through the  ?tendon.  Then, with traction on the Vicryl sutures, these tapes were  tied x 6 and the suture limbs crossed.  Next, with the arm in abduction the Vicryl sutures were placed centrally into SwiveLock.  It should be noted that the initial, medial anchor was  ?an all-suture anchor, but the bone quality was not sufficient to hold and thus we used 3 x 5 corkscrews which held very nicely.  After tying the SutureTapes and placing the Vicryl sutures into a SwiveLock suture tapes were crossed and placed in an  ?anterior and posterior SwiveLock with good fixation achieved.  Stable construct watertight was achieved with same range of motion parameters maintained.  Thorough irrigation was then performed.  Deltoid split closed using #1 Vicryl suture followed by  ?interrupted inverted 0 Vicryl suture, 2-0 Vicryl suture, and 3-0 Monocryl with Steri-Strips and impervious dressings applied.  The patient tolerated the procedure well without immediate complications, transferred to the recovery room in stable condition. ?  ? ? ? ?[place] ?D: 05/14/2021 12:52:28 pm T: 05/15/2021 2:19:00 am  ?JOB: [id number]/ [id number]  ?

## 2021-05-18 NOTE — Anesthesia Postprocedure Evaluation (Signed)
Anesthesia Post Note ? ?Patient: Carolyn [name] ? ?Procedure(s) Performed: LEFT SHOULDER ARTHROSCOPY, SUBACROMIAL DECOMPRESSION, BICEPS TENODESIS, MINI OPEN ROTATOR CUFF REPAIR (Left: Shoulder) ? ?  ? ?Patient location during evaluation: PACU ?Anesthesia Type: Regional and General ?Level of consciousness: awake and alert ?Pain management: pain level controlled ?Vital Signs Assessment: post-procedure vital signs reviewed and stable ?Respiratory status: spontaneous breathing, nonlabored ventilation, respiratory function stable and patient connected to nasal cannula oxygen ?Cardiovascular status: blood pressure returned to baseline and stable ?Postop Assessment: no apparent nausea or vomiting ?Anesthetic complications: no ? ? ?No notable events documented. ? ?Last Vitals:  ?Vitals:  ? 05/14/21 1315 05/14/21 1330  ?BP: 113/62 127/60  ?Pulse: (!) 56 (!) 57  ?Resp: 17 (!) 21  ?Temp:  36.4 ?C  ?SpO2: 98% 92%  ?  ?Last Pain:  ?Vitals:  ? 05/14/21 1330  ?TempSrc:   ?PainSc: 0-No pain  ? ? ?  ?  ?  ?  ?  ?  ? ?March Rummage [name] ? ? ? ? ?

## 2021-05-21 ENCOUNTER — Ambulatory Visit (INDEPENDENT_AMBULATORY_CARE_PROVIDER_SITE_OTHER): Payer: Medicare Other | Admitting: Orthopedic Surgery

## 2021-05-21 DIAGNOSIS — M75122 Complete rotator cuff tear or rupture of left shoulder, not specified as traumatic: Secondary | ICD-10-CM

## 2021-05-23 ENCOUNTER — Encounter: Payer: Self-pay | Admitting: Orthopedic Surgery

## 2021-05-23 NOTE — Progress Notes (Signed)
? ?Post-Op Visit Note ?  ?Patient: Carolyn [name]           ?Date of Birth: 19-Mar-1955           ?MRN: [id number] ?Visit Date: 05/21/2021 ?PCP: [name], FNP ? ? ?Assessment & Plan: ? ?Chief Complaint:  ?Chief Complaint  ?Patient presents with  ? Left Shoulder - Follow-up  ? ?Visit Diagnoses:  ?1. Complete tear of left rotator cuff, unspecified whether traumatic   ? ? ?Plan: Patient presents now a week out left shoulder arthroscopy biceps tenodesis and mini open rotator cuff tear repair of large rotator cuff tear.  On examination sutures intact and removed.  Incisions look good.  Passive range of motion demonstrates no coarse grinding or crepitus.  Continue with CPM machine 1 hour 3 times a day.  Pain medicine is working.  New sling provided.  2-week return and start physical therapy at that time.  Okay to be out of the sling when she is sitting around the house but when she is up and around I think it would be good to wear the sling based on the size of this particular rotator cuff tear. ? ?Follow-Up Instructions: Return in about 2 weeks (around 06/04/2021).  ? ?Orders:  ?No orders of the defined types were placed in this encounter. ? ?No orders of the defined types were placed in this encounter. ? ? ?Imaging: ?No results found. ? ?PMFS History: ?Patient Active Problem List  ? Diagnosis Date Noted  ? Elevated serum creatinine 04/12/2019  ? Pyelonephritis 02/08/2019  ? Port-A-Cath in place 10/19/2018  ? Encounter for antineoplastic chemotherapy 09/28/2018  ? Encounter for antineoplastic immunotherapy 09/28/2018  ? Goals of care, counseling/discussion 09/28/2018  ? Adenocarcinoma, lung ([place]) 09/12/2018  ? Brain metastases 08/30/2018  ? Mass of upper lobe of left lung 08/17/2018  ? Angina pectoris ([place]) 06/14/2012  ? CAD (coronary artery disease), native coronary artery 06/14/2012  ? S/P PTCA (percutaneous transluminal coronary angioplasty) 06/14/2012  ? Hyperlipidemia 06/14/2012  ? Essential hypertension  06/14/2012  ? Pre-diabetes 06/14/2012  ? ?Past Medical History:  ?Diagnosis Date  ? Anginal pain ([place])   ? " with exertion "  ? Arthritis   ? " 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 "  ? Asthma   ? h/o  ? Brain metastases 08/2018  ? Chronic kidney disease   ? Coronary artery disease   ? Diabetes mellitus without complication ([place])   ? Type II  ? GERD (gastroesophageal reflux disease)   ? H/O hiatal hernia   ? Heart murmur   ? History of radiation therapy 09/22/2018  ? stereotactic radiosurgery   ? HPV in female   ? h/o  ? Hypertension   ? nscl ca dx'd 08/07/18  ? Persistent headaches   ? h/o  ? Pneumonia   ? hx of PNA  ? Shortness of breath   ? Vaginal dryness   ? h/o  ?  ?Family History  ?Problem Relation Age of Onset  ? Breast cancer Other   ? Diabetes Mother   ? Glaucoma Mother   ? Hypertension Mother   ? Hypertension Father   ? Asthma Father   ? Diabetes Sister   ? Diabetes Brother   ? Hypertension Sister   ?  ?Past Surgical History:  ?Procedure Laterality Date  ? ABDOMINAL HYSTERECTOMY    ? bone spur    ? CARDIAC CATHETERIZATION  06/13/2012  ? carpel tunnel surgery Left   ? COLONOSCOPY    ?  9 years ago  ? CORONARY ANGIOPLASTY  06/13/2012  ? EYE SURGERY    ? cyst left eye   ? IR IMAGING GUIDED PORT INSERTION  10/10/2018  ? LEFT HEART CATHETERIZATION WITH CORONARY ANGIOGRAM N/A 06/13/2012  ? Procedure: LEFT HEART CATHETERIZATION WITH CORONARY ANGIOGRAM;  Surgeon: Laverda Page, MD;  Location: [facility] CATH LAB;  Service: Cardiovascular;  Laterality: N/A;  ? NECK SURGERY    ? rotator cuff surgery Right 2015  ? SHOULDER ARTHROSCOPY WITH ROTATOR CUFF REPAIR AND SUBACROMIAL DECOMPRESSION Left 05/14/2021  ? Procedure: LEFT SHOULDER ARTHROSCOPY, SUBACROMIAL DECOMPRESSION, BICEPS TENODESIS, MINI OPEN ROTATOR CUFF REPAIR;  Surgeon: Meredith Pel, MD;  Location: [facility];  Service: Orthopedics;  Laterality: Left;  ? VIDEO BRONCHOSCOPY WITH ENDOBRONCHIAL NAVIGATION N/A 09/06/2018  ? Procedure: VIDEO BRONCHOSCOPY WITH ENDOBRONCHIAL NAVIGATION, left upper  lung;  Surgeon: Lajuana Matte, MD;  Location: [facility];  Service: Thoracic;  Laterality: N/A;  ? VIDEO BRONCHOSCOPY WITH ENDOBRONCHIAL ULTRASOUND Left 09/06/2018  ? Procedure: VIDEO BRONCHOSCOPY WITH ENDOBRONCHIAL ULTRASOUND, left lung;  Surgeon: Lajuana Matte, MD;  Location: [facility];  Service: Thoracic;  Laterality: Left;  ? WISDOM TOOTH EXTRACTION    ? ?Social History  ? ?Occupational History  ? Not on file  ?Tobacco Use  ? Smoking status: Former  ?  Packs/day: 0.50  ?  Years: 30.00  ?  Pack years: 15.00  ?  Types: Cigarettes  ?  Quit date: 02/09/1999  ?  Years since quitting: 22.2  ? Smokeless tobacco: Never  ?Vaping Use  ? Vaping Use: Never used  ?Substance and Sexual Activity  ? Alcohol use: Not Currently  ? Drug use: Not Currently  ? Sexual activity: Yes  ?  Birth control/protection: Post-menopausal  ? ? ? ?

## 2021-05-25 ENCOUNTER — Encounter (HOSPITAL_COMMUNITY): Payer: Self-pay

## 2021-05-25 ENCOUNTER — Ambulatory Visit (HOSPITAL_COMMUNITY)
Admission: RE | Admit: 2021-05-25 | Discharge: 2021-05-25 | Disposition: A | Discharge status: Home / Self Care | Payer: Medicare Other | Source: Ambulatory Visit | Attending: Internal Medicine | Admitting: Internal Medicine

## 2021-05-25 DIAGNOSIS — C3490 Malignant neoplasm of unspecified part of unspecified bronchus or lung: Secondary | ICD-10-CM

## 2021-05-25 DIAGNOSIS — Z20822 Contact with and (suspected) exposure to covid-19: Secondary | ICD-10-CM | POA: Diagnosis not present

## 2021-05-25 DIAGNOSIS — K5710 Diverticulosis of small intestine without perforation or abscess without bleeding: Secondary | ICD-10-CM | POA: Diagnosis not present

## 2021-05-25 DIAGNOSIS — R911 Solitary pulmonary nodule: Secondary | ICD-10-CM | POA: Diagnosis not present

## 2021-05-27 ENCOUNTER — Ambulatory Visit: Payer: Medicare Other

## 2021-05-27 ENCOUNTER — Other Ambulatory Visit: Payer: Medicare Other

## 2021-05-27 ENCOUNTER — Inpatient Hospital Stay: Payer: Medicare Other

## 2021-05-27 ENCOUNTER — Other Ambulatory Visit: Payer: Self-pay

## 2021-05-27 ENCOUNTER — Inpatient Hospital Stay (HOSPITAL_BASED_OUTPATIENT_CLINIC_OR_DEPARTMENT_OTHER): Payer: Medicare Other | Admitting: Internal Medicine

## 2021-05-27 ENCOUNTER — Inpatient Hospital Stay: Payer: Medicare Other | Attending: Physician Assistant

## 2021-05-27 ENCOUNTER — Ambulatory Visit: Payer: Medicare Other | Admitting: Internal Medicine

## 2021-05-27 VITALS — BP 135/66 | HR 62 | Temp 97.5°F | Resp 18 | Ht 61.0 in | Wt 182.1 lb

## 2021-05-27 DIAGNOSIS — Z5112 Encounter for antineoplastic immunotherapy: Secondary | ICD-10-CM | POA: Insufficient documentation

## 2021-05-27 DIAGNOSIS — C3491 Malignant neoplasm of unspecified part of right bronchus or lung: Secondary | ICD-10-CM | POA: Diagnosis not present

## 2021-05-27 DIAGNOSIS — Z79899 Other long term (current) drug therapy: Secondary | ICD-10-CM | POA: Insufficient documentation

## 2021-05-27 DIAGNOSIS — C3412 Malignant neoplasm of upper lobe, left bronchus or lung: Secondary | ICD-10-CM | POA: Insufficient documentation

## 2021-05-27 DIAGNOSIS — C7931 Secondary malignant neoplasm of brain: Secondary | ICD-10-CM | POA: Insufficient documentation

## 2021-05-27 DIAGNOSIS — R5383 Other fatigue: Secondary | ICD-10-CM

## 2021-05-27 DIAGNOSIS — Z95828 Presence of other vascular implants and grafts: Secondary | ICD-10-CM

## 2021-05-27 DIAGNOSIS — C3492 Malignant neoplasm of unspecified part of left bronchus or lung: Secondary | ICD-10-CM

## 2021-05-27 LAB — CBC WITH DIFFERENTIAL (CANCER CENTER ONLY)
Abs Immature Granulocytes: 0.01 10*3/uL (ref 0.00–0.07)
Basophils Absolute: 0 10*3/uL (ref 0.0–0.1)
Basophils Relative: 1 %
Eosinophils Absolute: 0.4 10*3/uL (ref 0.0–0.5)
Eosinophils Relative: 5 %
HCT: 36.7 % (ref 36.0–46.0)
Hemoglobin: 11.4 g/dL — ABNORMAL LOW (ref 12.0–15.0)
Immature Granulocytes: 0 %
Lymphocytes Relative: 17 %
Lymphs Abs: 1.2 10*3/uL (ref 0.7–4.0)
MCH: 24.3 pg — ABNORMAL LOW (ref 26.0–34.0)
MCHC: 31.1 g/dL (ref 30.0–36.0)
MCV: 78.3 fL — ABNORMAL LOW (ref 80.0–100.0)
Monocytes Absolute: 0.6 10*3/uL (ref 0.1–1.0)
Monocytes Relative: 8 %
Neutro Abs: 5.1 10*3/uL (ref 1.7–7.7)
Neutrophils Relative %: 69 %
Platelet Count: 360 10*3/uL (ref 150–400)
RBC: 4.69 MIL/uL (ref 3.87–5.11)
RDW: 16 % — ABNORMAL HIGH (ref 11.5–15.5)
WBC Count: 7.4 10*3/uL (ref 4.0–10.5)
nRBC: 0 % (ref 0.0–0.2)

## 2021-05-27 LAB — CMP (CANCER CENTER ONLY)
ALT: 10 U/L (ref 0–44)
AST: 16 U/L (ref 15–41)
Albumin: 3.6 g/dL (ref 3.5–5.0)
Alkaline Phosphatase: 80 U/L (ref 38–126)
Anion gap: 8 (ref 5–15)
BUN: 12 mg/dL (ref 8–23)
CO2: 26 mmol/L (ref 22–32)
Calcium: 9 mg/dL (ref 8.9–10.3)
Chloride: 107 mmol/L (ref 98–111)
Creatinine: 1.54 mg/dL — ABNORMAL HIGH (ref 0.44–1.00)
GFR, Estimated: 37 mL/min — ABNORMAL LOW (ref >60)
Glucose, Bld: 110 mg/dL — ABNORMAL HIGH (ref 70–99)
Potassium: 3.8 mmol/L (ref 3.5–5.1)
Sodium: 141 mmol/L (ref 135–145)
Total Bilirubin: 0.4 mg/dL (ref 0.3–1.2)
Total Protein: 7.3 g/dL (ref 6.5–8.1)

## 2021-05-27 LAB — TSH: TSH: 2.249 u[IU]/mL (ref 0.350–4.500)

## 2021-05-27 MED ORDER — HEPARIN SOD (PORK) LOCK FLUSH 100 UNIT/ML IV SOLN
500.0000 [IU] | Freq: Once | INTRAVENOUS | Status: AC | PRN
  Administered 2021-05-27: 500 [IU]

## 2021-05-27 MED ORDER — SODIUM CHLORIDE 0.9% FLUSH
10.0000 mL | INTRAVENOUS | Status: DC | PRN
  Administered 2021-05-27: 10 mL

## 2021-05-27 MED ORDER — SODIUM CHLORIDE 0.9 % IV SOLN: Freq: Once | INTRAVENOUS | Status: AC

## 2021-05-27 MED ORDER — SODIUM CHLORIDE 0.9 % IV SOLN
200.0000 mg | Freq: Once | INTRAVENOUS | Status: AC
  Administered 2021-05-27: 200 mg via INTRAVENOUS
  Filled 2021-05-27: qty 200

## 2021-05-27 NOTE — Patient Instructions (Signed)
[facility]  Discharge Instructions: ?Thank you for choosing [facility] to provide your oncology and hematology care.  ? ?If you have a lab appointment with the [facility], please go directly to the [facility] and check in at the registration area. ?  ?Wear comfortable clothing and clothing appropriate for easy access to any Portacath or PICC line.  ? ?We strive to give you quality time with your provider. You may need to reschedule your appointment if you arrive late (15 or more minutes).  Arriving late affects you and other patients whose appointments are after yours.  Also, if you miss three or more appointments without notifying the office, you may be dismissed from the clinic at the provider?s discretion.    ?  ?For prescription refill requests, have your pharmacy contact our office and allow 72 hours for refills to be completed.   ? ?Today you received the following chemotherapy and/or immunotherapy agent: Keytruda    ?  ?To help prevent nausea and vomiting after your treatment, we encourage you to take your nausea medication as directed. ? ?BELOW ARE SYMPTOMS THAT SHOULD BE REPORTED IMMEDIATELY: ?*FEVER GREATER THAN 100.4 F (38 ?C) OR HIGHER ?*CHILLS OR SWEATING ?*NAUSEA AND VOMITING THAT IS NOT CONTROLLED WITH YOUR NAUSEA MEDICATION ?*UNUSUAL SHORTNESS OF BREATH ?*UNUSUAL BRUISING OR BLEEDING ?*URINARY PROBLEMS (pain or burning when urinating, or frequent urination) ?*BOWEL PROBLEMS (unusual diarrhea, constipation, pain near the anus) ?TENDERNESS IN MOUTH AND THROAT WITH OR WITHOUT PRESENCE OF ULCERS (sore throat, sores in mouth, or a toothache) ?UNUSUAL RASH, SWELLING OR PAIN  ?UNUSUAL VAGINAL DISCHARGE OR ITCHING  ? ?Items with * indicate a potential emergency and should be followed up as soon as possible or go to the Emergency Department if any problems should occur. ? ?Please show the CHEMOTHERAPY ALERT CARD or IMMUNOTHERAPY ALERT CARD at check-in to  the Emergency Department and triage nurse. ? ?Should you have questions after your visit or need to cancel or reschedule your appointment, please contact [facility]  Dept: [phone]  and follow the prompts.  Office hours are 8:00 a.m. to 4:30 p.m. Monday - Friday. Please note that voicemails left after 4:00 p.m. may not be returned until the following business day.  We are closed weekends and major holidays. You have access to a nurse at all times for urgent questions. Please call the main number to the clinic Dept: [phone] and follow the prompts. ? ? ?For any non-urgent questions, you may also contact your provider using MyChart. We now offer e-Visits for anyone 27 and older to request care online for non-urgent symptoms. For details visit [email or web address]. ?  ?Also download the MyChart app! Go to the app store, search "MyChart", open the app, select [facility], and log in with your MyChart username and password. ? ?Due to Covid, a mask is required upon entering the hospital/clinic. If you do not have a mask, one will be given to you upon arrival. For doctor visits, patients may have 1 support person aged 68 or older with them. For treatment visits, patients cannot have anyone with them due to current Covid guidelines and our immunocompromised population.  ? ?

## 2021-05-27 NOTE — Progress Notes (Signed)
Per Dr. Earlie Server, ok to treat with creat 1.54mg /dL today ?

## 2021-05-27 NOTE — Progress Notes (Signed)
?    [facility] ?Telephone:([phone]) [phone]   [phone] ? ?OFFICE PROGRESS NOTE ? ?[name] ?[address] ?[place] [address] ? ?DIAGNOSIS: Stage IV (T1c, N1, M1c ) non-small cell lung cancer, adenocarcinoma diagnosed in July 2020 and presented with left upper lobe lung nodule in addition to right upper lobe lung nodule with left hilar lymphadenopathy as well as multiple brain metastasis. ? ?Biomarker Findings ?Microsatellite status - MS-Stable ?Tumor Mutational Burden - 4 Muts/Mb ?Genomic Findings ?For a complete list of the genes assayed, please refer to the Appendix. ?ATM G204* ?KRAS G12C ?PDGFRA P122T ?SMO R199W ?7 Disease relevant genes with no reportable alterations: ALK, BRAF, ?EGFR, ERBB2, MET, RET, ROS1 ? ?PDL 1 expression was 1% ? ?PRIOR THERAPY:  ?1) SRS to multiple brain metastasis under the care of Dr. Isidore Moos. ?2) SRS to a 4 mm new brain metastasis under the care of Dr. Isidore Moos and Dr. Saintclair Halsted on 09/02/2020 ? ?CURRENT THERAPY: Systemic chemotherapy with carboplatin for AUC of 5, Alimta 500 mg/M2 and Keytruda 200 mg IV every 3 weeks.  First dose October 05, 2018.  Status post 46 cycles.  Starting from cycle #5 the patient will be on treatment with maintenance Alimta 500 mg/M2 and Keytruda 200 mg IV every 3 weeks.  She has been on treatment with single agent Keytruda recently secondary to renal insufficiency. ? ?INTERVAL HISTORY: ?Carolyn [name] 66 y.o. female returns to the clinic today for follow-up visit.  The patient is feeling fine today with no concerning complaints except for the pain in the left shoulder area after her recent surgery.  She was in pain for almost 2 weeks and unable to do much because of the shoulder surgery.  She also has some sleep disturbance.  She has been taking melatonin with mild improvement.  She has no chest pain, shortness of breath, cough or hemoptysis.  She denied having any fever or chills.  She has no nausea,  vomiting, diarrhea or constipation.  She denied having any headache or visual changes.  She has no recent weight loss or night sweats.  She had repeat CT scan of the chest, abdomen pelvis performed recently and she is here for evaluation and discussion of her scan results. ? ? ?MEDICAL HISTORY: ?Past Medical History:  ?Diagnosis Date  ? Anginal pain ([place])   ? " with exertion "  ? Arthritis   ? " 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 "  ? Asthma   ? h/o  ? Brain metastases 08/2018  ? Chronic kidney disease   ? Coronary artery disease   ? Diabetes mellitus without complication ([place])   ? Type II  ? GERD (gastroesophageal reflux disease)   ? H/O hiatal hernia   ? Heart murmur   ? History of radiation therapy 09/22/2018  ? stereotactic radiosurgery   ? HPV in female   ? h/o  ? Hypertension   ? nscl ca dx'd 08/07/18  ? Persistent headaches   ? h/o  ? Pneumonia   ? hx of PNA  ? Shortness of breath   ? Vaginal dryness   ? h/o  ? ? ?ALLERGIES:  is allergic to atorvastatin, crestor [rosuvastatin], and lisinopril. ? ?MEDICATIONS:  ?Current Outpatient Medications  ?Medication Sig Dispense Refill  ? acetaminophen (TYLENOL) 500 MG tablet Take 1,000 mg by mouth every 8 (eight) hours as needed for mild pain, fever or headache.     ? amLODipine (NORVASC) 10 MG tablet Take 1 tablet by  mouth once daily (Patient taking differently: Take 10 mg by mouth daily with supper.) 90 tablet 0  ? aspirin EC 81 MG tablet Take 81 mg by mouth daily.    ? carvedilol (COREG) 25 MG tablet Take 1/2 (one-half) tablet by mouth twice daily 180 tablet 0  ? Cholecalciferol (VITAMIN D-3) 125 MCG (5000 UT) TABS Take 5,000 Units by mouth daily.    ? empagliflozin (JARDIANCE) 25 MG TABS tablet Take 1 tablet (25 mg total) by mouth daily before breakfast. 90 tablet 3  ? esomeprazole (NEXIUM) 20 MG capsule Take 20 mg by mouth daily with breakfast.    ? ezetimibe (ZETIA) 10 MG tablet TAKE 1 TABLET BY MOUTH ONCE DAILY AFTER SUPPER 90 tablet 3  ? fexofenadine (ALLEGRA) 180 MG tablet Take 180  mg by mouth daily.    ? fluticasone (FLONASE) 50 MCG/ACT nasal spray Place 1 spray into both nostrils daily as needed for allergies or rhinitis.    ? Lancets (ONETOUCH DELICA PLUS [id number]) MISC SMARTSIG:1 Topical 3 Times Daily    ? levocetirizine (XYZAL) 5 MG tablet Take 5 mg by mouth daily as needed for allergies.    ? lidocaine-prilocaine (EMLA) cream Apply to the Port-A-Cath site 30 to 60 minutes before chemotherapy. 30 g 2  ? metFORMIN (GLUCOPHAGE) 500 MG tablet Take 500 mg by mouth daily with supper.    ? methocarbamol (ROBAXIN) 500 MG tablet Take 1 tablet (500 mg total) by mouth every 8 (eight) hours as needed for muscle spasms. 30 tablet 0  ? montelukast (SINGULAIR) 10 MG tablet Take 10 mg by mouth daily as needed (allergies).    ? nitroGLYCERIN (NITROSTAT) 0.4 MG SL tablet Place 1 tablet (0.4 mg total) under the tongue every 5 (five) minutes as needed for chest pain. 25 tablet 2  ? ONETOUCH ULTRA test strip USE 1 STRIP TO CHECK GLUCOSE THREE TIMES DAILY    ? oxyCODONE (OXY IR/ROXICODONE) 5 MG immediate release tablet Take 1 tablet (5 mg total) by mouth every 6 (six) hours as needed for severe pain. 40 tablet 0  ? pentoxifylline (TRENTAL) 400 MG CR tablet Take 480m tab daily x 1 week, then 4069mBID; take with food (Patient taking differently: 400 mg 2 (two) times daily with breakfast and lunch.) 60 tablet 5  ? Pitavastatin Calcium (LIVALO) 4 MG TABS Take 1 tablet (4 mg total) by mouth at bedtime. 90 tablet 1  ? Polyethyl Glycol-Propyl Glycol (SYSTANE) 0.4-0.3 % GEL ophthalmic gel Place 1 application. into both eyes every 6 (six) hours as needed (dry eyes).    ? telmisartan (MICARDIS) 40 MG tablet Take 40 mg by mouth daily.    ? vitamin E 180 MG (400 UNITS) capsule Take 400 IU daily x 1 week, then 400 IU BID (Patient taking differently: Take 400 Units by mouth in the morning and at bedtime.) 60 capsule 5  ? ?No current facility-administered medications for this visit.  ? ? ?SURGICAL HISTORY:  ?Past  Surgical History:  ?Procedure Laterality Date  ? ABDOMINAL HYSTERECTOMY    ? bone spur    ? CARDIAC CATHETERIZATION  06/13/2012  ? carpel tunnel surgery Left   ? COLONOSCOPY    ? 9 years ago  ? CORONARY ANGIOPLASTY  06/13/2012  ? EYE SURGERY    ? cyst left eye   ? IR IMAGING GUIDED PORT INSERTION  10/10/2018  ? LEFT HEART CATHETERIZATION WITH CORONARY ANGIOGRAM N/A 06/13/2012  ? Procedure: LEFT HEART CATHETERIZATION WITH CORONARY ANGIOGRAM;  Surgeon: JaCammy Brochure  Carlynn Herald, MD;  Location: [facility] CATH LAB;  Service: Cardiovascular;  Laterality: N/A;  ? NECK SURGERY    ? rotator cuff surgery Right 2015  ? SHOULDER ARTHROSCOPY WITH ROTATOR CUFF REPAIR AND SUBACROMIAL DECOMPRESSION Left 05/14/2021  ? Procedure: LEFT SHOULDER ARTHROSCOPY, SUBACROMIAL DECOMPRESSION, BICEPS TENODESIS, MINI OPEN ROTATOR CUFF REPAIR;  Surgeon: Meredith Pel, MD;  Location: [facility];  Service: Orthopedics;  Laterality: Left;  ? VIDEO BRONCHOSCOPY WITH ENDOBRONCHIAL NAVIGATION N/A 09/06/2018  ? Procedure: VIDEO BRONCHOSCOPY WITH ENDOBRONCHIAL NAVIGATION, left upper lung;  Surgeon: Lajuana Matte, MD;  Location: [facility];  Service: Thoracic;  Laterality: N/A;  ? VIDEO BRONCHOSCOPY WITH ENDOBRONCHIAL ULTRASOUND Left 09/06/2018  ? Procedure: VIDEO BRONCHOSCOPY WITH ENDOBRONCHIAL ULTRASOUND, left lung;  Surgeon: Lajuana Matte, MD;  Location: [facility];  Service: Thoracic;  Laterality: Left;  ? WISDOM TOOTH EXTRACTION    ? ? ?REVIEW OF SYSTEMS:  Constitutional: negative ?Eyes: negative ?Ears, nose, mouth, throat, and face: negative ?Respiratory: negative ?Cardiovascular: negative ?Gastrointestinal: negative ?Genitourinary:negative ?Integument/breast: negative ?Hematologic/lymphatic: negative ?Musculoskeletal:positive for arthralgias ?Neurological: negative ?Behavioral/Psych: positive for sleep disturbance ?Endocrine: negative ?Allergic/Immunologic: negative  ? ?PHYSICAL EXAMINATION: General appearance: alert, cooperative, and no distress ?Head:  Normocephalic, without obvious abnormality, atraumatic ?Neck: no adenopathy, no JVD, supple, symmetrical, trachea midline, and thyroid not enlarged, symmetric, no tenderness/mass/nodules ?Lymph nodes: Cervical, supraclavicula

## 2021-06-04 ENCOUNTER — Encounter: Payer: Medicare Other | Admitting: Surgical

## 2021-06-04 ENCOUNTER — Ambulatory Visit (INDEPENDENT_AMBULATORY_CARE_PROVIDER_SITE_OTHER): Payer: Medicare Other | Admitting: Surgical

## 2021-06-04 ENCOUNTER — Encounter: Payer: Self-pay | Admitting: Surgical

## 2021-06-04 DIAGNOSIS — M75122 Complete rotator cuff tear or rupture of left shoulder, not specified as traumatic: Secondary | ICD-10-CM

## 2021-06-04 NOTE — Progress Notes (Signed)
? ?Post-Op Visit Note ?  ?Patient: Carolyn [name]           ?Date of Birth: 1955/11/02           ?MRN: [id number] ?Visit Date: 06/04/2021 ?PCP: [name], FNP ? ? ?Assessment & Plan: ? ?Chief Complaint:  ?Chief Complaint  ?Patient presents with  ? Left Shoulder - Routine Post Op  ?  05/14/21 (3w) Left Shoulder Arthroscopy, Subacromial Decompression, Biceps Tenodesis, Mini Open Rotator Cuff Repair - Left ? ?  ? ?Visit Diagnoses:  ?1. Complete tear of left rotator cuff, unspecified whether traumatic   ? ? ?Plan: Carolyn [name] is a 66 y.o. female who presents s/p left shoulder rotator cuff repair and biceps tenodesis on 05/14/2021.  Patient is doing well and pain is overall controlled.  Up to 100 degrees on CPM machine.  Denies any chest pain, SOB, fevers, chills.  Has discontinued oxycodone and only takes Tylenol for pain control.  Rates pain 4/10.  Having regular bowel movements.  Localizes most of her pain to the anterior and lateral aspect of the shoulder.  No numbness or tingling. ? ?On exam, patient has range of motion 30 degrees external rotation, 70 degrees abduction, 100 Griese forward flexion.  Intact EPL, FPL, finger abduction, finger adduction, pronation/supination, bicep, tricep, deltoid of operative extremity.  Axillary nerve intact with deltoid firing.  Incisions are healing well without evidence of infection or dehiscence.  Sutures removed and replaced with Steri-Strips today.  2+ radial pulse of the operative extremity.  5 -/5 strength of supraspinatus and infraspinatus with 5/5 subscapularis strength on exam today.  No evidence of Popeye deformity. ? ?Plan is start physical therapy with [facility] physical therapy.  Okay for passive range of motion and active assisted range of motion but no full active range of motion or rotator cuff strengthening until 06/29/2021 which will be a little over 6 weeks out from procedure.  Follow-up in 4 weeks for clinical recheck with Dr. Marlou Sa..   ? ?Follow-Up Instructions: No follow-ups on file.  ? ?Orders:  ?No orders of the defined types were placed in this encounter. ? ?No orders of the defined types were placed in this encounter. ? ? ?Imaging: ?No results found. ? ?PMFS History: ?Patient Active Problem List  ? Diagnosis Date Noted  ? Elevated serum creatinine 04/12/2019  ? Pyelonephritis 02/08/2019  ? Port-A-Cath in place 10/19/2018  ? Encounter for antineoplastic chemotherapy 09/28/2018  ? Encounter for antineoplastic immunotherapy 09/28/2018  ? Goals of care, counseling/discussion 09/28/2018  ? Adenocarcinoma, lung ([facility]) 09/12/2018  ? Malignant neoplasm metastatic to brain [facility]) 08/30/2018  ? Mass of upper lobe of left lung 08/17/2018  ? Angina pectoris ([place]) 06/14/2012  ? CAD (coronary artery disease), native coronary artery 06/14/2012  ? S/P PTCA (percutaneous transluminal coronary angioplasty) 06/14/2012  ? Hyperlipidemia 06/14/2012  ? Essential hypertension 06/14/2012  ? Pre-diabetes 06/14/2012  ? ?Past Medical History:  ?Diagnosis Date  ? Anginal pain ([place])   ? " with exertion "  ? Arthritis   ? " 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 "  ? Asthma   ? h/o  ? Brain metastases 08/2018  ? Chronic kidney disease   ? Coronary artery disease   ? Diabetes mellitus without complication ([place])   ? Type II  ? GERD (gastroesophageal reflux disease)   ? H/O hiatal hernia   ? Heart murmur   ? History of radiation therapy 09/22/2018  ? stereotactic radiosurgery   ? HPV in female   ?  h/o  ? Hypertension   ? nscl ca dx'd 08/07/18  ? Persistent headaches   ? h/o  ? Pneumonia   ? hx of PNA  ? Shortness of breath   ? Vaginal dryness   ? h/o  ?  ?Family History  ?Problem Relation Age of Onset  ? Breast cancer Other   ? Diabetes Mother   ? Glaucoma Mother   ? Hypertension Mother   ? Hypertension Father   ? Asthma Father   ? Diabetes Sister   ? Diabetes Brother   ? Hypertension Sister   ?  ?Past Surgical History:  ?Procedure Laterality Date  ? ABDOMINAL HYSTERECTOMY    ? bone spur    ? CARDIAC  CATHETERIZATION  06/13/2012  ? carpel tunnel surgery Left   ? COLONOSCOPY    ? 9 years ago  ? CORONARY ANGIOPLASTY  06/13/2012  ? EYE SURGERY    ? cyst left eye   ? IR IMAGING GUIDED PORT INSERTION  10/10/2018  ? LEFT HEART CATHETERIZATION WITH CORONARY ANGIOGRAM N/A 06/13/2012  ? Procedure: LEFT HEART CATHETERIZATION WITH CORONARY ANGIOGRAM;  Surgeon: Laverda Page, MD;  Location: [facility] CATH LAB;  Service: Cardiovascular;  Laterality: N/A;  ? NECK SURGERY    ? rotator cuff surgery Right 2015  ? SHOULDER ARTHROSCOPY WITH ROTATOR CUFF REPAIR AND SUBACROMIAL DECOMPRESSION Left 05/14/2021  ? Procedure: LEFT SHOULDER ARTHROSCOPY, SUBACROMIAL DECOMPRESSION, BICEPS TENODESIS, MINI OPEN ROTATOR CUFF REPAIR;  Surgeon: Meredith Pel, MD;  Location: [facility];  Service: Orthopedics;  Laterality: Left;  ? VIDEO BRONCHOSCOPY WITH ENDOBRONCHIAL NAVIGATION N/A 09/06/2018  ? Procedure: VIDEO BRONCHOSCOPY WITH ENDOBRONCHIAL NAVIGATION, left upper lung;  Surgeon: Lajuana Matte, MD;  Location: [facility];  Service: Thoracic;  Laterality: N/A;  ? VIDEO BRONCHOSCOPY WITH ENDOBRONCHIAL ULTRASOUND Left 09/06/2018  ? Procedure: VIDEO BRONCHOSCOPY WITH ENDOBRONCHIAL ULTRASOUND, left lung;  Surgeon: Lajuana Matte, MD;  Location: [facility];  Service: Thoracic;  Laterality: Left;  ? WISDOM TOOTH EXTRACTION    ? ?Social History  ? ?Occupational History  ? Not on file  ?Tobacco Use  ? Smoking status: Former  ?  Packs/day: 0.50  ?  Years: 30.00  ?  Pack years: 15.00  ?  Types: Cigarettes  ?  Quit date: 02/09/1999  ?  Years since quitting: 22.3  ? Smokeless tobacco: Never  ?Vaping Use  ? Vaping Use: Never used  ?Substance and Sexual Activity  ? Alcohol use: Not Currently  ? Drug use: Not Currently  ? Sexual activity: Yes  ?  Birth control/protection: Post-menopausal  ? ?  ?

## 2021-06-05 ENCOUNTER — Encounter: Payer: Medicare Other | Admitting: Surgical

## 2021-06-07 DIAGNOSIS — S43432A Superior glenoid labrum lesion of left shoulder, initial encounter: Secondary | ICD-10-CM

## 2021-06-07 DIAGNOSIS — M7522 Bicipital tendinitis, left shoulder: Secondary | ICD-10-CM

## 2021-06-07 DIAGNOSIS — M75122 Complete rotator cuff tear or rupture of left shoulder, not specified as traumatic: Secondary | ICD-10-CM

## 2021-06-09 DIAGNOSIS — S43422D Sprain of left rotator cuff capsule, subsequent encounter: Secondary | ICD-10-CM | POA: Diagnosis not present

## 2021-06-09 NOTE — Telephone Encounter (Signed)
Error

## 2021-06-09 NOTE — Telephone Encounter (Signed)
error 

## 2021-06-13 DIAGNOSIS — Z20822 Contact with and (suspected) exposure to covid-19: Secondary | ICD-10-CM | POA: Diagnosis not present

## 2021-06-13 NOTE — Progress Notes (Signed)
[facility] ?OFFICE PROGRESS NOTE ? ?[name] ?[address] ?[place] [address] ? ?DIAGNOSIS:  Stage IV (T1c, N1, M1c ) non-small cell lung cancer, adenocarcinoma diagnosed in July 2020 and presented with left upper lobe lung nodule in addition to right upper lobe lung nodule with left hilar lymphadenopathy as well as multiple brain metastasis. ?  ?Biomarker Findings ?Microsatellite status - MS-Stable ?Tumor Mutational Burden - 4 Muts/Mb ?Genomic Findings ?For a complete list of the genes assayed, please refer to the Appendix. ?ATM G204* ?KRAS G12C ?PDGFRA P122T ?SMO R199W ?7 Disease relevant genes with no reportable alterations: ALK, BRAF, ?EGFR, ERBB2, MET, RET, ROS1 ?  ?PDL 1 expression was 1% ? ?PRIOR THERAPY: ?1) SBRT to multiple brain metastasis under the care of Dr. Isidore Moos. Completed in August 2020 ?2) SRS to the new metastatic brain lesions under the care of Dr. Saintclair Halsted and Dr. Isidore Moos on 09/02/20 and 12/16/20 ? ?CURRENT THERAPY: Systemic chemotherapy with carboplatin for AUC of 5, Alimta 500 mg/M2 and Keytruda 200 mg IV every 3 weeks.  First dose October 05, 2018.  Status post 47 cycles.  Starting from cycle #5 the patient will be on treatment with maintenance Alimta 500 mg/M2 and Keytruda 200 mg IV every 3 weeks.  She has been on treatment with single agent Keytruda secondary to renal insufficiency ? ?INTERVAL HISTORY: ?Carolyn [name] 66 y.o. female returns to the clinic today for a follow-up visit. The patient is feeling fairly well today without any concerning complaints. She had a left shoulder repair for a torn rotator cuff for which she is seeing PT doing at home exercises for. Otherwise, the patient denies any concerning complaints today.  Denies any recent fever, chills, or weight loss. She reports her that baseline night sweats approximately 1x per week have gotten much better. She denies any chest pain, shortness of breath, cough, or hemoptysis.   Denies any nausea, vomiting, or diarrhea. She does have occasional constipation for which she takes an OTC laxative. She states her bowel movements have never been very regular and she has a bowel movement about every 2-3 days.  Denies any rashes or skin changes.  She denies any recent headache or visual changes. She is going to have a repeat brain MRI tomorrow and designated follow up with radiation oncology to review the results. She also reports trouble falling asleep at night and is interested in an OTC supplement. She tried Ambien in the past but experienced nightmares from it. She is followed closely by nephrology due to her renal insufficiency.  Her chemotherapy with Alimta was discontinued due to renal insufficiency.  She is here today for evaluation and repeat blood work before starting cycle #48. ? ? ?MEDICAL HISTORY: ?Past Medical History:  ?Diagnosis Date  ? Anginal pain ([place])   ? " with exertion "  ? Arthritis   ? " 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 "  ? Asthma   ? h/o  ? Brain metastases 08/2018  ? Chronic kidney disease   ? Coronary artery disease   ? 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 GERD (gastroesophageal reflux disease)   ? H/O hiatal hernia   ? Heart murmur   ? History of radiation therapy 09/22/2018  ? stereotactic radiosurgery   ? HPV in female   ? h/o  ? Hypertension   ? nscl ca dx'd 08/07/18  ? Persistent headaches   ? h/o  ? Pneumonia   ?  hx of PNA  ? Shortness of breath   ? Vaginal dryness   ? h/o  ? ? ?ALLERGIES:  is allergic to atorvastatin, crestor [rosuvastatin], and lisinopril. ? ?MEDICATIONS:  ?Current Outpatient Medications  ?Medication Sig Dispense Refill  ? acetaminophen (TYLENOL) 500 MG tablet Take 1,000 mg by mouth every 8 (eight) hours as needed for mild pain, fever or headache.     ? amLODipine (NORVASC) 10 MG tablet Take 1 tablet by mouth once daily (Patient taking differently: Take 10 mg by mouth daily with supper.) 90 tablet 0  ? aspirin EC 81 MG tablet Take 81 mg by  mouth daily.    ? carvedilol (COREG) 25 MG tablet Take 1/2 (one-half) tablet by mouth twice daily 180 tablet 0  ? Cholecalciferol (VITAMIN D-3) 125 MCG (5000 UT) TABS Take 5,000 Units by mouth daily.    ? empagliflozin (JARDIANCE) 25 MG TABS tablet Take 1 tablet (25 mg total) by mouth daily before breakfast. 90 tablet 3  ? esomeprazole (NEXIUM) 20 MG capsule Take 20 mg by mouth daily with breakfast.    ? ezetimibe (ZETIA) 10 MG tablet TAKE 1 TABLET BY MOUTH ONCE DAILY AFTER SUPPER 90 tablet 3  ? fexofenadine (ALLEGRA) 180 MG tablet Take 180 mg by mouth daily.    ? fluticasone (FLONASE) 50 MCG/ACT nasal spray Place 1 spray into both nostrils daily as needed for allergies or rhinitis.    ? Lancets (ONETOUCH DELICA PLUS [id number]) MISC SMARTSIG:1 Topical 3 Times Daily    ? levocetirizine (XYZAL) 5 MG tablet Take 5 mg by mouth daily as needed for allergies.    ? lidocaine-prilocaine (EMLA) cream Apply to the Port-A-Cath site 30 to 60 minutes before chemotherapy. 30 g 2  ? metFORMIN (GLUCOPHAGE) 500 MG tablet Take 500 mg by mouth daily with supper.    ? methocarbamol (ROBAXIN) 500 MG tablet Take 1 tablet (500 mg total) by mouth every 8 (eight) hours as needed for muscle spasms. 30 tablet 0  ? montelukast (SINGULAIR) 10 MG tablet Take 10 mg by mouth daily as needed (allergies).    ? nitroGLYCERIN (NITROSTAT) 0.4 MG SL tablet Place 1 tablet (0.4 mg total) under the tongue every 5 (five) minutes as needed for chest pain. 25 tablet 2  ? ONETOUCH ULTRA test strip USE 1 STRIP TO CHECK GLUCOSE THREE TIMES DAILY    ? oxyCODONE (OXY IR/ROXICODONE) 5 MG immediate release tablet Take 1 tablet (5 mg total) by mouth every 6 (six) hours as needed for severe pain. 40 tablet 0  ? pentoxifylline (TRENTAL) 400 MG CR tablet Take 411m tab daily x 1 week, then 4036mBID; take with food (Patient taking differently: 400 mg 2 (two) times daily with breakfast and lunch.) 60 tablet 5  ? Pitavastatin Calcium (LIVALO) 4 MG TABS Take 1 tablet (4 mg  total) by mouth at bedtime. 90 tablet 1  ? Polyethyl Glycol-Propyl Glycol (SYSTANE) 0.4-0.3 % GEL ophthalmic gel Place 1 application. into both eyes every 6 (six) hours as needed (dry eyes).    ? predniSONE (DELTASONE) 5 MG tablet See admin instructions.    ? telmisartan (MICARDIS) 40 MG tablet Take 40 mg by mouth daily.    ? vitamin E 180 MG (400 UNITS) capsule Take 400 IU daily x 1 week, then 400 IU BID (Patient taking differently: Take 400 Units by mouth in the morning and at bedtime.) 60 capsule 5  ? ?No current facility-administered medications for this visit.  ? ?Facility-Administered Medications Ordered in  Other Visits  ?Medication Dose Route Frequency Provider Last Rate Last Admin  ? heparin lock flush 100 unit/mL  500 Units Intracatheter Once PRN Curt Bears, MD      ? pembrolizumab [facility]) 200 mg in sodium chloride 0.9 % 50 mL chemo infusion  200 mg Intravenous Once Curt Bears, MD      ? sodium chloride flush (NS) 0.9 % injection 10 mL  10 mL Intracatheter PRN Curt Bears, MD      ? ? ?SURGICAL HISTORY:  ?Past Surgical History:  ?Procedure Laterality Date  ? ABDOMINAL HYSTERECTOMY    ? bone spur    ? CARDIAC CATHETERIZATION  06/13/2012  ? carpel tunnel surgery Left   ? COLONOSCOPY    ? 9 years ago  ? CORONARY ANGIOPLASTY  06/13/2012  ? EYE SURGERY    ? cyst left eye   ? IR IMAGING GUIDED PORT INSERTION  10/10/2018  ? LEFT HEART CATHETERIZATION WITH CORONARY ANGIOGRAM N/A 06/13/2012  ? Procedure: LEFT HEART CATHETERIZATION WITH CORONARY ANGIOGRAM;  Surgeon: Laverda Page, MD;  Location: [facility] CATH LAB;  Service: Cardiovascular;  Laterality: N/A;  ? NECK SURGERY    ? rotator cuff surgery Right 2015  ? SHOULDER ARTHROSCOPY WITH ROTATOR CUFF REPAIR AND SUBACROMIAL DECOMPRESSION Left 05/14/2021  ? Procedure: LEFT SHOULDER ARTHROSCOPY, SUBACROMIAL DECOMPRESSION, BICEPS TENODESIS, MINI OPEN ROTATOR CUFF REPAIR;  Surgeon: Meredith Pel, MD;  Location: [facility];  Service: Orthopedics;  Laterality:  Left;  ? VIDEO BRONCHOSCOPY WITH ENDOBRONCHIAL NAVIGATION N/A 09/06/2018  ? Procedure: VIDEO BRONCHOSCOPY WITH ENDOBRONCHIAL NAVIGATION, left upper lung;  Surgeon: Lajuana Matte, MD;  Location: [facility];  [place]

## 2021-06-15 DIAGNOSIS — Z20822 Contact with and (suspected) exposure to covid-19: Secondary | ICD-10-CM | POA: Diagnosis not present

## 2021-06-17 ENCOUNTER — Inpatient Hospital Stay: Payer: Medicare Other

## 2021-06-17 ENCOUNTER — Ambulatory Visit: Payer: Medicare Other | Admitting: Physician Assistant

## 2021-06-17 ENCOUNTER — Other Ambulatory Visit: Payer: Medicare Other

## 2021-06-17 ENCOUNTER — Other Ambulatory Visit: Payer: Self-pay

## 2021-06-17 ENCOUNTER — Inpatient Hospital Stay (HOSPITAL_BASED_OUTPATIENT_CLINIC_OR_DEPARTMENT_OTHER): Payer: Medicare Other | Admitting: Physician Assistant

## 2021-06-17 ENCOUNTER — Inpatient Hospital Stay: Payer: Medicare Other | Attending: Physician Assistant

## 2021-06-17 ENCOUNTER — Ambulatory Visit: Payer: Medicare Other

## 2021-06-17 VITALS — BP 140/64 | HR 71 | Temp 98.0°F | Resp 15 | Wt 182.9 lb

## 2021-06-17 DIAGNOSIS — C7931 Secondary malignant neoplasm of brain: Secondary | ICD-10-CM | POA: Insufficient documentation

## 2021-06-17 DIAGNOSIS — R5383 Other fatigue: Secondary | ICD-10-CM

## 2021-06-17 DIAGNOSIS — C3412 Malignant neoplasm of upper lobe, left bronchus or lung: Secondary | ICD-10-CM | POA: Diagnosis not present

## 2021-06-17 DIAGNOSIS — Z95828 Presence of other vascular implants and grafts: Secondary | ICD-10-CM

## 2021-06-17 DIAGNOSIS — C3491 Malignant neoplasm of unspecified part of right bronchus or lung: Secondary | ICD-10-CM

## 2021-06-17 DIAGNOSIS — C3492 Malignant neoplasm of unspecified part of left bronchus or lung: Secondary | ICD-10-CM

## 2021-06-17 DIAGNOSIS — Z5112 Encounter for antineoplastic immunotherapy: Secondary | ICD-10-CM | POA: Insufficient documentation

## 2021-06-17 DIAGNOSIS — Z79899 Other long term (current) drug therapy: Secondary | ICD-10-CM | POA: Insufficient documentation

## 2021-06-17 LAB — CBC WITH DIFFERENTIAL (CANCER CENTER ONLY)
Abs Immature Granulocytes: 0.12 10*3/uL — ABNORMAL HIGH (ref 0.00–0.07)
Basophils Absolute: 0 10*3/uL (ref 0.0–0.1)
Basophils Relative: 0 %
Eosinophils Absolute: 0.1 10*3/uL (ref 0.0–0.5)
Eosinophils Relative: 0 %
HCT: 40.4 % (ref 36.0–46.0)
Hemoglobin: 12.4 g/dL (ref 12.0–15.0)
Immature Granulocytes: 1 %
Lymphocytes Relative: 15 %
Lymphs Abs: 2.3 10*3/uL (ref 0.7–4.0)
MCH: 24.4 pg — ABNORMAL LOW (ref 26.0–34.0)
MCHC: 30.7 g/dL (ref 30.0–36.0)
MCV: 79.5 fL — ABNORMAL LOW (ref 80.0–100.0)
Monocytes Absolute: 1 10*3/uL (ref 0.1–1.0)
Monocytes Relative: 6 %
Neutro Abs: 11.6 10*3/uL — ABNORMAL HIGH (ref 1.7–7.7)
Neutrophils Relative %: 78 %
Platelet Count: 267 10*3/uL (ref 150–400)
RBC: 5.08 MIL/uL (ref 3.87–5.11)
RDW: 17 % — ABNORMAL HIGH (ref 11.5–15.5)
WBC Count: 15 10*3/uL — ABNORMAL HIGH (ref 4.0–10.5)
nRBC: 0 % (ref 0.0–0.2)

## 2021-06-17 LAB — CMP (CANCER CENTER ONLY)
ALT: 13 U/L (ref 0–44)
AST: 13 U/L — ABNORMAL LOW (ref 15–41)
Albumin: 3.7 g/dL (ref 3.5–5.0)
Alkaline Phosphatase: 75 U/L (ref 38–126)
Anion gap: 10 (ref 5–15)
BUN: 22 mg/dL (ref 8–23)
CO2: 25 mmol/L (ref 22–32)
Calcium: 8.9 mg/dL (ref 8.9–10.3)
Chloride: 105 mmol/L (ref 98–111)
Creatinine: 1.6 mg/dL — ABNORMAL HIGH (ref 0.44–1.00)
GFR, Estimated: 35 mL/min — ABNORMAL LOW (ref >60)
Glucose, Bld: 228 mg/dL — ABNORMAL HIGH (ref 70–99)
Potassium: 3.6 mmol/L (ref 3.5–5.1)
Sodium: 140 mmol/L (ref 135–145)
Total Bilirubin: 0.3 mg/dL (ref 0.3–1.2)
Total Protein: 7.1 g/dL (ref 6.5–8.1)

## 2021-06-17 LAB — TSH: TSH: 2.316 u[IU]/mL (ref 0.350–4.500)

## 2021-06-17 MED ORDER — HEPARIN SOD (PORK) LOCK FLUSH 100 UNIT/ML IV SOLN
500.0000 [IU] | Freq: Once | INTRAVENOUS | Status: AC | PRN
  Administered 2021-06-17: 500 [IU]

## 2021-06-17 MED ORDER — SODIUM CHLORIDE 0.9 % IV SOLN
200.0000 mg | Freq: Once | INTRAVENOUS | Status: AC
  Administered 2021-06-17: 200 mg via INTRAVENOUS
  Filled 2021-06-17: qty 200

## 2021-06-17 MED ORDER — SODIUM CHLORIDE 0.9% FLUSH
10.0000 mL | INTRAVENOUS | Status: DC | PRN
  Administered 2021-06-17: 10 mL

## 2021-06-17 MED ORDER — LIDOCAINE-PRILOCAINE 2.5-2.5 % EX CREA: TOPICAL_CREAM | CUTANEOUS | 2 refills | Status: DC

## 2021-06-17 MED ORDER — SODIUM CHLORIDE 0.9 % IV SOLN: Freq: Once | INTRAVENOUS | Status: AC

## 2021-06-17 NOTE — Progress Notes (Signed)
Ok to treat with creat 1.60 Mg/dL per Cassie H, PA ?

## 2021-06-17 NOTE — Patient Instructions (Signed)
[facility]ICAL [facility]COLOGY  Discharge Instructions: ?Thank you for choosing [facility] to provide your oncology and hematology care.  ? ?If you have a lab appointment with the Cancer Center, please go directly to the Cancer Center and check in at the registration area. ?  ?Wear comfortable clothing and clothing appropriate for easy access to any Portacath or PICC line.  ? ?We strive to give you quality time with your provider. You may need to reschedule your appointment if you arrive late (15 or more minutes).  Arriving late affects you and other patients whose appointments are after yours.  Also, if you miss three or more appointments without notifying the office, you may be dismissed from the clinic at the provider?s discretion.    ?  ?For prescription refill requests, have your pharmacy contact our office and allow 72 hours for refills to be completed.   ? ?Today you received the following chemotherapy and/or immunotherapy agents: Keytruda ?  ?To help prevent nausea and vomiting after your treatment, we encourage you to take your nausea medication as directed. ? ?BELOW ARE SYMPTOMS THAT SHOULD BE REPORTED IMMEDIATELY: ?*FEVER GREATER THAN 100.4 F (38 ?C) OR HIGHER ?*CHILLS OR SWEATING ?*NAUSEA AND VOMITING THAT IS NOT CONTROLLED WITH YOUR NAUSEA MEDICATION ?*UNUSUAL SHORTNESS OF BREATH ?*UNUSUAL BRUISING OR BLEEDING ?*URINARY PROBLEMS (pain or burning when urinating, or frequent urination) ?*BOWEL PROBLEMS (unusual diarrhea, constipation, pain near the anus) ?TENDERNESS IN MOUTH AND THROAT WITH OR WITHOUT PRESENCE OF ULCERS (sore throat, sores in mouth, or a toothache) ?UNUSUAL RASH, SWELLING OR PAIN  ?UNUSUAL VAGINAL DISCHARGE OR ITCHING  ? ?Items with * indicate a potential emergency and should be followed up as soon as possible or go to the Emergency Department if any problems should occur. ? ?Please show the CHEMOTHERAPY ALERT CARD or IMMUNOTHERAPY ALERT CARD at check-in to the  Emergency Department and triage nurse. ? ?Should you have questions after your visit or need to cancel or reschedule your appointment, please contact [facility]  Dept: [phone]  and follow the prompts.  Office hours are 8:00 a.m. to 4:30 p.m. Monday - Friday. Please note that voicemails left after 4:00 p.m. may not be returned until the following business day.  We are closed weekends and major holidays. You have access to a nurse at all times for urgent questions. Please call the main number to the clinic Dept: [phone] and follow the prompts. ? ? ?For any non-urgent questions, you may also contact your provider using MyChart. We now offer e-Visits for anyone 18 and older to request care online for non-urgent symptoms. For details visit [email or web address]. ?  ?Also download the MyChart app! Go to the app store, search "MyChart", open the app, select [facility], and log in with your MyChart username and password. ? ?Due to Covid, a mask is required upon entering the hospital/clinic. If you do not have a mask, one will be given to you upon arrival. For doctor visits, patients may have 1 support person aged 18 or older with them. For treatment visits, patients cannot have anyone with them due to current Covid guidelines and our immunocompromised population.  ? ?

## 2021-06-18 ENCOUNTER — Ambulatory Visit
Admission: RE | Admit: 2021-06-18 | Discharge: 2021-06-18 | Disposition: A | Discharge status: Home / Self Care | Payer: Medicare Other | Source: Ambulatory Visit | Attending: Radiation Oncology | Admitting: Radiation Oncology

## 2021-06-18 ENCOUNTER — Telehealth: Payer: Self-pay | Admitting: Radiation Therapy

## 2021-06-18 DIAGNOSIS — G9389 Other specified disorders of brain: Secondary | ICD-10-CM | POA: Diagnosis not present

## 2021-06-18 DIAGNOSIS — G936 Cerebral edema: Secondary | ICD-10-CM | POA: Diagnosis not present

## 2021-06-18 DIAGNOSIS — C7931 Secondary malignant neoplasm of brain: Secondary | ICD-10-CM | POA: Diagnosis not present

## 2021-06-18 MED ORDER — GADOPENTETATE DIMEGLUMINE 469.01 MG/ML IV SOLN
17.0000 mL | Freq: Once | INTRAVENOUS | Status: AC | PRN
  Administered 2021-06-18: 17 mL via INTRAVENOUS

## 2021-06-18 NOTE — Telephone Encounter (Signed)
Carolyn [name] called to reschedule her follow-up with Dr. Isidore Moos. Per her request and availability, the appointment has been changed to [facility] 5/17 @ 9:30.  ? ?[name] R.T.(R)(T) ?Radiation Special Procedures Navigator  ?

## 2021-06-22 ENCOUNTER — Inpatient Hospital Stay: Payer: Medicare Other

## 2021-06-23 ENCOUNTER — Ambulatory Visit: Payer: Self-pay | Admitting: Radiation Oncology

## 2021-06-23 NOTE — Progress Notes (Incomplete)
Mrs. Deshazo presents today for follow-up after completing [place] treatment on 12/16/2020 and 09/02/2020, and to review MRI results from 06/18/2021 ? ?Seizures:  ?Headaches:  ?Nausea:   ?Dizziness/ataxia:  ?Difficulty with hand coordination:  ?Focal numbness/weakness:  ?Visual deficits/changes:  ?Confusion/Memory deficits:  ? ?Additional Complaints / other details: ***Continues F/U and treatment with medical oncologist Dr. Curt Bears. Had left shoulder arthroscopy and rotator cuff repair this past April ?

## 2021-06-24 ENCOUNTER — Ambulatory Visit
Admission: RE | Admit: 2021-06-24 | Discharge: 2021-06-24 | Disposition: A | Discharge status: Home / Self Care | Payer: Medicare Other | Source: Ambulatory Visit | Attending: Radiation Oncology | Admitting: Radiation Oncology

## 2021-06-24 ENCOUNTER — Encounter: Payer: Self-pay | Admitting: Radiation Oncology

## 2021-06-24 ENCOUNTER — Other Ambulatory Visit: Payer: Self-pay

## 2021-06-24 VITALS — BP 121/57 | HR 59 | Temp 97.3°F | Resp 18 | Ht 61.0 in | Wt 182.2 lb

## 2021-06-24 DIAGNOSIS — C7931 Secondary malignant neoplasm of brain: Secondary | ICD-10-CM | POA: Diagnosis not present

## 2021-06-24 DIAGNOSIS — Z7984 Long term (current) use of oral hypoglycemic drugs: Secondary | ICD-10-CM | POA: Insufficient documentation

## 2021-06-24 DIAGNOSIS — Z7982 Long term (current) use of aspirin: Secondary | ICD-10-CM | POA: Diagnosis not present

## 2021-06-24 DIAGNOSIS — G9389 Other specified disorders of brain: Secondary | ICD-10-CM | POA: Insufficient documentation

## 2021-06-24 DIAGNOSIS — Z79899 Other long term (current) drug therapy: Secondary | ICD-10-CM | POA: Insufficient documentation

## 2021-06-24 DIAGNOSIS — Z923 Personal history of irradiation: Secondary | ICD-10-CM | POA: Diagnosis not present

## 2021-06-24 DIAGNOSIS — C3412 Malignant neoplasm of upper lobe, left bronchus or lung: Secondary | ICD-10-CM | POA: Diagnosis not present

## 2021-06-24 NOTE — Progress Notes (Incomplete)
Mrs. Wimmer presents today for follow-up after completing [place] treatment on 12/16/2020 and 09/02/2020, and to review MRI results from 06/18/2021 ? ?Seizures: No ?Headaches: No ?Nausea:  No ?Dizziness/ataxia: No ?Difficulty with hand coordination: No ?Focal numbness/weakness: No ?Visual deficits/changes: No ?Confusion/Memory deficits: No ? ?Additional Complaints / other details: Continues F/U and treatment with medical oncologist Dr. Curt Bears. Had left shoulder arthroscopy and rotator cuff repair this past April ?Vitals:  ? 06/24/21 0934  ?BP: (!) 150/67  ?Pulse: (!) 131  ?Resp: 18  ?Temp: (!) 97.3 ?F (36.3 ?C)  ?TempSrc: Temporal  ?SpO2: 100%  ?Weight: 82.7 kg  ?Height: 5\' 1"  (1.549 m)  ? ?Patient denies s/s  of hypertension ?

## 2021-06-25 ENCOUNTER — Encounter: Payer: Self-pay | Admitting: Radiation Oncology

## 2021-06-25 NOTE — Progress Notes (Signed)
Radiation Oncology         [phone] ________________________________  Name: LITHA LAMARTINA MRN: [id number]  Date: 06/24/2021  DOB: 1955-11-15  Follow-Up Visit Note  Outpatient  CC: [name], FNP  Curt Bears, MD  Diagnosis and Prior Radiotherapy:    ICD-10-CM   1. Metastasis to brain [facility])  C79.31     2. Malignant neoplasm metastatic to brain [facility])  C79.31        CHIEF COMPLAINT: Here for follow-up and surveillance of brain cancer  Narrative:   Mrs. Crotteau presents today for follow-up after completing [place] treatment on 12/16/2020 and 09/02/2020, and to review MRI results from 06/18/2021  Seizures: No Headaches: No Nausea:  No Dizziness/ataxia: No Difficulty with hand coordination: No Focal numbness/weakness: No Visual deficits/changes: No Confusion/Memory deficits: No  Additional Complaints / other details: Continues F/U and treatment with medical oncologist Dr. Curt Bears. 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  Meds: Current Outpatient Medications  Medication Sig Dispense Refill   acetaminophen (TYLENOL) 500 MG tablet Take 1,000 mg by mouth every 8 (eight) hours as needed for mild pain, fever or headache.      amLODipine (NORVASC) 10 MG tablet Take 1 tablet by mouth once daily (Patient taking differently: Take 10 mg by mouth daily with supper.) 90 tablet 0   aspirin EC 81 MG tablet Take 81 mg by mouth daily.     carvedilol (COREG) 25 MG tablet Take 1/2 (one-half) tablet by mouth twice daily 180 tablet 0   Cholecalciferol (VITAMIN D-3) 125 MCG (5000 UT)  TABS Take 5,000 Units by mouth daily.     empagliflozin (JARDIANCE) 25 MG TABS tablet Take 1 tablet (25 mg total) by mouth daily before breakfast. 90 tablet 3   esomeprazole (NEXIUM) 20 MG capsule Take 20 mg by mouth daily with breakfast.     ezetimibe (ZETIA) 10 MG tablet TAKE 1 TABLET BY MOUTH ONCE DAILY AFTER SUPPER 90 tablet 3   fexofenadine (ALLEGRA) 180 MG tablet Take 180 mg by mouth daily.     fluticasone (FLONASE) 50 MCG/ACT nasal spray Place 1 spray into both nostrils daily as needed for allergies or rhinitis.     Lancets (ONETOUCH DELICA PLUS [id number]) MISC SMARTSIG:1 Topical 3 Times Daily     levocetirizine (XYZAL) 5 MG tablet Take 5 mg by mouth daily as needed for allergies.     lidocaine-prilocaine (EMLA) cream Apply to the Port-A-Cath site 30 to 60 minutes before chemotherapy. 30 g 2   metFORMIN (GLUCOPHAGE) 500 MG tablet Take 500 mg by mouth daily with supper.     methocarbamol (ROBAXIN) 500 MG tablet Take 1 tablet (500 mg total) by mouth every 8 (eight) hours as needed for muscle spasms. 30 tablet 0   montelukast (SINGULAIR) 10 MG tablet Take 10 mg by mouth daily as needed (allergies).     nitroGLYCERIN (NITROSTAT) 0.4 MG SL tablet Place 1 tablet (0.4 mg total) under the tongue every 5 (five) minutes as needed  for chest pain. 25 tablet 2   ONETOUCH ULTRA test strip USE 1 STRIP TO CHECK GLUCOSE THREE TIMES DAILY     pentoxifylline (TRENTAL) 400 MG CR tablet Take 400mg  tab daily x 1 week, then 400mg  BID; take with food (Patient taking differently: 400 mg 2 (two) times daily with breakfast and lunch.) 60 tablet 5   Pitavastatin Calcium (LIVALO) 4 MG TABS Take 1 tablet (4 mg total) by mouth at bedtime. 90 tablet 1   Polyethyl Glycol-Propyl Glycol (SYSTANE) 0.4-0.3 % GEL ophthalmic gel Place 1 application. into both eyes every 6 (six) hours as needed (dry eyes).     predniSONE (DELTASONE) 5 MG tablet See admin instructions.     telmisartan (MICARDIS) 40 MG tablet Take 40 mg by mouth  daily.     vitamin E 180 MG (400 UNITS) capsule Take 400 IU daily x 1 week, then 400 IU BID (Patient taking differently: Take 400 Units by mouth in the morning and at bedtime.) 60 capsule 5   oxyCODONE (OXY IR/ROXICODONE) 5 MG immediate release tablet Take 1 tablet (5 mg total) by mouth every 6 (six) hours as needed for severe pain. (Patient not taking: Reported on 06/24/2021) 40 tablet 0   No current facility-administered medications for this encounter.    Physical Findings: The patient is in no acute distress. Patient is alert and oriented.  height is 5\' 1"  (1.549 m) and weight is 182 lb 4 oz (82.7 kg). Her temporal temperature is 97.3 F (36.3 C) (abnormal). Her blood pressure is 121/57 (abnormal) and her pulse is 59 (abnormal). Her respiration is 18 and oxygen saturation is 100%. .    General: Alert and oriented, in no acute distress HEENT: Head is normocephalic. Extraocular movements are intact. Oropharynx is clear. Neurologic: Cranial nerves II through XII are grossly intact. No obvious focalities. Speech is fluent. Coordination is intact. Psychiatric: Judgment and insight are intact. Affect is appropriate. MSK: strength is symmetric  KPS = 100  100 - Normal; no complaints; no evidence of disease. 90   - Able to carry on normal activity; minor signs or symptoms of disease. 80   - Normal activity with effort; some signs or symptoms of disease. 39   - Cares for self; unable to carry on normal activity or to do active work. 60   - Requires occasional assistance, but is able to care for most of his personal needs. 50   - Requires considerable assistance and frequent medical care. 6   - Disabled; requires special care and assistance. 38   - Severely disabled; hospital admission is indicated although death not imminent. 78   - Very sick; hospital admission necessary; active supportive treatment necessary. 10   - Moribund; fatal processes progressing rapidly. 0     - Dead  Karnofsky DA,  Abelmann [place], Craver LS and Burchenal [facility] [phone]) The use of the nitrogen mustards in the palliative treatment of carcinoma: with particular reference to bronchogenic carcinoma Cancer 1 634-56  Lab Findings: Lab Results  Component Value Date   WBC 15.0 (H) 06/17/2021   HGB 12.4 06/17/2021   HCT 40.4 06/17/2021   MCV 79.5 (L) 06/17/2021   PLT 267 06/17/2021    Radiographic Findings: MR Brain W Wo Contrast  Result Date: 06/20/2021 CLINICAL DATA:  Brain metastases, assess treatment response. EXAM: MRI HEAD WITHOUT AND WITH CONTRAST TECHNIQUE: Multiplanar, multiecho pulse sequences of the brain and surrounding structures were obtained without and with intravenous contrast. CONTRAST:  49mL MAGNEVIST GADOPENTETATE  DIMEGLUMINE 469.01 MG/ML IV SOLN COMPARISON:  03/19/2021 FINDINGS: BRAIN New Lesions: None. Larger lesions: None. Stable or Smaller lesions: Anterior left temporal lobe metastasis measures 16 x 9 x 19 mm, within 1 mm of prior. Mild adjacent vasogenic edema is diminished. A superior right frontal nodule on 12:127 measures 5 mm, stable. Other Brain findings: Scattered FLAIR hyperintensities in the cerebral white matter are non progressed. Unchanged encephalomalacia in the bilateral cerebellum. No infarct, hemorrhage, hydrocephalus, or collection. Vascular: Normal flow voids and vascular enhancements. Skull and upper cervical spine: Negative for marrow lesion. Cervical spine degeneration and C3-4 ACDF. Sinuses/Orbits: Negative IMPRESSION: 1. Essentially size stable left temporal metastasis with decreased vasogenic edema. 2. Stable subcentimeter high right frontal metastasis. 3. No new lesion. Electronically Signed   By: Jorje Guild M.D.   On: 06/20/2021 04:05    Impression/Plan: She is doing well after radiosurgery to the brain.  MRI looks excellent. I personally shared her imaging with her. 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 She will continue to  follow-up with medical oncology and we will arrange an MRI of her brain in 3 months prior to neurosurgery follow-up.    On date of service, in total, I spent 20 minutes on this encounter. Patient was seen in person.  Note was signed the day after encounter. Minutes indicated refer only to date of service.   _____________________________________   Eppie Gibson, MD

## 2021-06-30 DIAGNOSIS — S43422D Sprain of left rotator cuff capsule, subsequent encounter: Secondary | ICD-10-CM | POA: Diagnosis not present

## 2021-07-02 ENCOUNTER — Other Ambulatory Visit: Payer: Self-pay | Admitting: Cardiology

## 2021-07-02 ENCOUNTER — Ambulatory Visit (INDEPENDENT_AMBULATORY_CARE_PROVIDER_SITE_OTHER): Payer: Medicare Other | Admitting: Orthopedic Surgery

## 2021-07-02 ENCOUNTER — Encounter: Payer: Self-pay | Admitting: Orthopedic Surgery

## 2021-07-02 DIAGNOSIS — M1990 Unspecified osteoarthritis, unspecified site: Secondary | ICD-10-CM | POA: Diagnosis not present

## 2021-07-02 DIAGNOSIS — M7989 Other specified soft tissue disorders: Secondary | ICD-10-CM | POA: Diagnosis not present

## 2021-07-02 DIAGNOSIS — C3490 Malignant neoplasm of unspecified part of unspecified bronchus or lung: Secondary | ICD-10-CM | POA: Diagnosis not present

## 2021-07-02 DIAGNOSIS — M79642 Pain in left hand: Secondary | ICD-10-CM | POA: Diagnosis not present

## 2021-07-02 DIAGNOSIS — M359 Systemic involvement of connective tissue, unspecified: Secondary | ICD-10-CM | POA: Diagnosis not present

## 2021-07-02 DIAGNOSIS — R768 Other specified abnormal immunological findings in serum: Secondary | ICD-10-CM | POA: Diagnosis not present

## 2021-07-02 DIAGNOSIS — N1831 Chronic kidney disease, stage 3a: Secondary | ICD-10-CM | POA: Diagnosis not present

## 2021-07-02 DIAGNOSIS — M75122 Complete rotator cuff tear or rupture of left shoulder, not specified as traumatic: Secondary | ICD-10-CM

## 2021-07-02 NOTE — Progress Notes (Signed)
Post-Op Visit Note   Patient: Carolyn [name]           Date of Birth: 02/14/1955           MRN: [id number] Visit Date: 07/02/2021 PCP: [name], FNP   Assessment & Plan:  Chief Complaint:  Chief Complaint  Patient presents with   Left Shoulder - Follow-up   Visit Diagnoses:  1. Complete tear of left rotator cuff, unspecified whether traumatic     Plan: Patient presents now about 6 weeks out left shoulder arthroscopy subacromial decompression biceps tenodesis and mini open rotator cuff tear repair.  She is doing well.  Going to physical therapy to start strengthening.  She is able to sleep on the left-hand side.  On examination her range of motion is 45/80/140.  Subscap and supraspinatus strength 5+ out of 5 infraspinatus strength 5- out of 5.  Incision looks good.  Plan at this time is to continue strengthening.  Overall patient has made very good progress in terms of getting a good foundational range of motion to start her strengthening chapter of physical therapy.  Follow-up in 6 weeks for final check.  Follow-Up Instructions: Return in about 6 weeks (around 08/13/2021).   Orders:  No orders of the defined types were placed in this encounter.  No orders of the defined types were placed in this encounter.   Imaging: No results found.  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[place]) 08/30/2018   Mass of upper lobe of left lung 08/17/2018   Angina  pectoris ([place]) 06/14/2012   CAD (coronary artery disease), native coronary artery 06/14/2012   S/P PTCA (percutaneous transluminal coronary angioplasty) 06/14/2012   Hyperlipidemia 06/14/2012   Essential hypertension 06/14/2012   Pre-diabetes 06/14/2012   Past Medical History:  Diagnosis Date   Anginal pain ([place])    " with exertion "   Arthritis    " 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 "   Asthma    h/o   Brain metastases 08/2018   Chronic kidney disease    Coronary artery disease    Diabetes mellitus without complication (HCC)    Type II   GERD (gastroesophageal reflux disease)    H/O hiatal hernia    Heart murmur    History of radiation therapy 09/22/2018   stereotactic radiosurgery    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   Hypertension    nscl ca dx'd 08/07/18   Persistent headaches    h/o   Pneumonia    hx of PNA   Shortness of breath    Vaginal dryness    h/o    Family History  Problem Relation Age of Onset   Breast cancer Other    Diabetes Mother    Glaucoma Mother    Hypertension Mother    Hypertension Father    Asthma Father    Diabetes Sister    Diabetes Brother    Hypertension Sister     Past Surgical History:  Procedure Laterality Date   ABDOMINAL  HYSTERECTOMY     bone spur     CARDIAC CATHETERIZATION  06/13/2012   carpel tunnel surgery Left    COLONOSCOPY     9 years ago   CORONARY ANGIOPLASTY  06/13/2012   EYE SURGERY     cyst left eye    IR IMAGING GUIDED PORT INSERTION  10/10/2018   LEFT HEART CATHETERIZATION WITH CORONARY ANGIOGRAM N/A 06/13/2012   Procedure: LEFT HEART CATHETERIZATION WITH CORONARY ANGIOGRAM;  Surgeon: Laverda Page, MD;  Location: [facility] CATH LAB;  Service: Cardiovascular;  Laterality: N/A;   NECK SURGERY     rotator cuff surgery Right 2015   SHOULDER ARTHROSCOPY WITH ROTATOR CUFF REPAIR AND SUBACROMIAL DECOMPRESSION Left 05/14/2021   Procedure: LEFT SHOULDER ARTHROSCOPY, SUBACROMIAL DECOMPRESSION, BICEPS TENODESIS, MINI OPEN ROTATOR CUFF REPAIR;  Surgeon: Meredith Pel, MD;  Location: [place];  Service: Orthopedics;  Laterality: Left;   VIDEO BRONCHOSCOPY WITH ENDOBRONCHIAL NAVIGATION N/A 09/06/2018   Procedure: VIDEO BRONCHOSCOPY WITH ENDOBRONCHIAL NAVIGATION, left upper lung;  Surgeon: Lajuana Matte, MD;  Location: [facility] OR;  Service: Thoracic;  Laterality: N/A;   VIDEO BRONCHOSCOPY WITH ENDOBRONCHIAL ULTRASOUND Left 09/06/2018   Procedure: VIDEO BRONCHOSCOPY WITH ENDOBRONCHIAL ULTRASOUND, left lung;  Surgeon: Lajuana Matte, MD;  Location: [facility] OR;  Service: Thoracic;  Laterality: Left;   WISDOM TOOTH EXTRACTION     Social History   Occupational History   Not on file  Tobacco Use   Smoking status: Former    Packs/day: 0.50    Years: 30.00    Pack years: 15.00    Types: Cigarettes    Quit date: 02/09/1999    Years since quitting: 22.4   Smokeless tobacco: Never  Vaping Use   Vaping Use: Never used  Substance and Sexual Activity   Alcohol use: Not Currently   Drug use: Not Currently   Sexual activity: Yes    Birth control/protection: Post-menopausal

## 2021-07-03 DIAGNOSIS — S43422D Sprain of left rotator cuff capsule, subsequent encounter: Secondary | ICD-10-CM | POA: Diagnosis not present

## 2021-07-07 DIAGNOSIS — S43422D Sprain of left rotator cuff capsule, subsequent encounter: Secondary | ICD-10-CM | POA: Diagnosis not present

## 2021-07-08 ENCOUNTER — Ambulatory Visit: Payer: Medicare Other

## 2021-07-08 ENCOUNTER — Other Ambulatory Visit: Payer: Medicare Other

## 2021-07-08 ENCOUNTER — Inpatient Hospital Stay: Payer: Medicare Other

## 2021-07-08 ENCOUNTER — Inpatient Hospital Stay (HOSPITAL_BASED_OUTPATIENT_CLINIC_OR_DEPARTMENT_OTHER): Payer: Medicare Other | Admitting: Internal Medicine

## 2021-07-08 ENCOUNTER — Ambulatory Visit: Payer: Medicare Other | Admitting: Internal Medicine

## 2021-07-08 ENCOUNTER — Other Ambulatory Visit: Payer: Self-pay

## 2021-07-08 VITALS — BP 133/70 | HR 60 | Temp 97.8°F | Resp 16 | Ht 61.0 in | Wt 181.4 lb

## 2021-07-08 DIAGNOSIS — C3491 Malignant neoplasm of unspecified part of right bronchus or lung: Secondary | ICD-10-CM

## 2021-07-08 DIAGNOSIS — C7931 Secondary malignant neoplasm of brain: Secondary | ICD-10-CM

## 2021-07-08 DIAGNOSIS — Z5112 Encounter for antineoplastic immunotherapy: Secondary | ICD-10-CM

## 2021-07-08 DIAGNOSIS — C3492 Malignant neoplasm of unspecified part of left bronchus or lung: Secondary | ICD-10-CM

## 2021-07-08 DIAGNOSIS — Z95828 Presence of other vascular implants and grafts: Secondary | ICD-10-CM

## 2021-07-08 DIAGNOSIS — R5383 Other fatigue: Secondary | ICD-10-CM

## 2021-07-08 DIAGNOSIS — C3412 Malignant neoplasm of upper lobe, left bronchus or lung: Secondary | ICD-10-CM | POA: Diagnosis not present

## 2021-07-08 DIAGNOSIS — Z79899 Other long term (current) drug therapy: Secondary | ICD-10-CM | POA: Diagnosis not present

## 2021-07-08 LAB — CMP (CANCER CENTER ONLY)
ALT: 10 U/L (ref 0–44)
AST: 17 U/L (ref 15–41)
Albumin: 3.8 g/dL (ref 3.5–5.0)
Alkaline Phosphatase: 72 U/L (ref 38–126)
Anion gap: 7 (ref 5–15)
BUN: 15 mg/dL (ref 8–23)
CO2: 26 mmol/L (ref 22–32)
Calcium: 9.2 mg/dL (ref 8.9–10.3)
Chloride: 108 mmol/L (ref 98–111)
Creatinine: 1.6 mg/dL — ABNORMAL HIGH (ref 0.44–1.00)
GFR, Estimated: 35 mL/min — ABNORMAL LOW (ref >60)
Glucose, Bld: 125 mg/dL — ABNORMAL HIGH (ref 70–99)
Potassium: 3.7 mmol/L (ref 3.5–5.1)
Sodium: 141 mmol/L (ref 135–145)
Total Bilirubin: 0.4 mg/dL (ref 0.3–1.2)
Total Protein: 6.8 g/dL (ref 6.5–8.1)

## 2021-07-08 LAB — CBC WITH DIFFERENTIAL (CANCER CENTER ONLY)
Abs Immature Granulocytes: 0.02 10*3/uL (ref 0.00–0.07)
Basophils Absolute: 0 10*3/uL (ref 0.0–0.1)
Basophils Relative: 1 %
Eosinophils Absolute: 0.4 10*3/uL (ref 0.0–0.5)
Eosinophils Relative: 7 %
HCT: 37.1 % (ref 36.0–46.0)
Hemoglobin: 11.6 g/dL — ABNORMAL LOW (ref 12.0–15.0)
Immature Granulocytes: 0 %
Lymphocytes Relative: 20 %
Lymphs Abs: 1.1 10*3/uL (ref 0.7–4.0)
MCH: 24.8 pg — ABNORMAL LOW (ref 26.0–34.0)
MCHC: 31.3 g/dL (ref 30.0–36.0)
MCV: 79.3 fL — ABNORMAL LOW (ref 80.0–100.0)
Monocytes Absolute: 0.5 10*3/uL (ref 0.1–1.0)
Monocytes Relative: 9 %
Neutro Abs: 3.5 10*3/uL (ref 1.7–7.7)
Neutrophils Relative %: 63 %
Platelet Count: 280 10*3/uL (ref 150–400)
RBC: 4.68 MIL/uL (ref 3.87–5.11)
RDW: 16.6 % — ABNORMAL HIGH (ref 11.5–15.5)
WBC Count: 5.4 10*3/uL (ref 4.0–10.5)
nRBC: 0 % (ref 0.0–0.2)

## 2021-07-08 LAB — TSH: TSH: 2.127 u[IU]/mL (ref 0.350–4.500)

## 2021-07-08 MED ORDER — HEPARIN SOD (PORK) LOCK FLUSH 100 UNIT/ML IV SOLN
500.0000 [IU] | Freq: Once | INTRAVENOUS | Status: AC | PRN
  Administered 2021-07-08: 500 [IU]

## 2021-07-08 MED ORDER — SODIUM CHLORIDE 0.9% FLUSH
10.0000 mL | INTRAVENOUS | Status: DC | PRN
  Administered 2021-07-08: 10 mL

## 2021-07-08 MED ORDER — SODIUM CHLORIDE 0.9 % IV SOLN
200.0000 mg | Freq: Once | INTRAVENOUS | Status: AC
  Administered 2021-07-08: 200 mg via INTRAVENOUS
  Filled 2021-07-08: qty 200

## 2021-07-08 MED ORDER — SODIUM CHLORIDE 0.9 % IV SOLN: Freq: Once | INTRAVENOUS | Status: DC

## 2021-07-08 MED ORDER — SODIUM CHLORIDE 0.9 % IV SOLN: Freq: Once | INTRAVENOUS | Status: AC

## 2021-07-08 NOTE — Progress Notes (Signed)
Per Dr Julien Nordmann it is ok to treat pt today with Keytruda and creatinine today  of 1.60.

## 2021-07-08 NOTE — Patient Instructions (Signed)
[facility]  Discharge Instructions: ?Thank you for choosing [facility] to provide your oncology and hematology care.  ? ?If you have a lab appointment with the Cancer Center, please go directly to the Cancer Center and check in at the registration area. ?  ?Wear comfortable clothing and clothing appropriate for easy access to any Portacath or PICC line.  ? ?We strive to give you quality time with your provider. You may need to reschedule your appointment if you arrive late (15 or more minutes).  Arriving late affects you and other patients whose appointments are after yours.  Also, if you miss three or more appointments without notifying the office, you may be dismissed from the clinic at the provider?s discretion.    ?  ?For prescription refill requests, have your pharmacy contact our office and allow 72 hours for refills to be completed.   ? ?Today you received the following chemotherapy and/or immunotherapy agents: Keytruda ?  ?To help prevent nausea and vomiting after your treatment, we encourage you to take your nausea medication as directed. ? ?BELOW ARE SYMPTOMS THAT SHOULD BE REPORTED IMMEDIATELY: ?*FEVER GREATER THAN 100.4 F (38 ?C) OR HIGHER ?*CHILLS OR SWEATING ?*NAUSEA AND VOMITING THAT IS NOT CONTROLLED WITH YOUR NAUSEA MEDICATION ?*UNUSUAL SHORTNESS OF BREATH ?*UNUSUAL BRUISING OR BLEEDING ?*URINARY PROBLEMS (pain or burning when urinating, or frequent urination) ?*BOWEL PROBLEMS (unusual diarrhea, constipation, pain near the anus) ?TENDERNESS IN MOUTH AND THROAT WITH OR WITHOUT PRESENCE OF ULCERS (sore throat, sores in mouth, or a toothache) ?UNUSUAL RASH, SWELLING OR PAIN  ?UNUSUAL VAGINAL DISCHARGE OR ITCHING  ? ?Items with * indicate a potential emergency and should be followed up as soon as possible or go to the Emergency Department if any problems should occur. ? ?Please show the CHEMOTHERAPY ALERT CARD or IMMUNOTHERAPY ALERT CARD at check-in to the  Emergency Department and triage nurse. ? ?Should you have questions after your visit or need to cancel or reschedule your appointment, please contact [facility]  Dept: [phone]  and follow the prompts.  Office hours are 8:00 a.m. to 4:30 p.m. Monday - Friday. Please note that voicemails left after 4:00 p.m. may not be returned until the following business day.  We are closed weekends and major holidays. You have access to a nurse at all times for urgent questions. Please call the main number to the clinic Dept: [phone] and follow the prompts. ? ? ?For any non-urgent questions, you may also contact your provider using MyChart. We now offer e-Visits for anyone 18 and older to request care online for non-urgent symptoms. For details visit [email or web address]. ?  ?Also download the MyChart app! Go to the app store, search "MyChart", open the app, select [facility], and log in with your MyChart username and password. ? ?Due to Covid, a mask is required upon entering the hospital/clinic. If you do not have a mask, one will be given to you upon arrival. For doctor visits, patients may have 1 support person aged 18 or older with them. For treatment visits, patients cannot have anyone with them due to current Covid guidelines and our immunocompromised population.  ? ?

## 2021-07-08 NOTE — Progress Notes (Signed)
[facility] [phone]   [phone]  OFFICE PROGRESS NOTE  [name], [place] [address]  DIAGNOSIS: Stage IV (T1c, N1, M1c ) non-small cell lung cancer, adenocarcinoma diagnosed in July 2020 and presented with left upper lobe lung nodule in addition to right upper lobe lung nodule with left hilar lymphadenopathy as well as multiple brain metastasis.  Biomarker Findings Microsatellite status - MS-Stable Tumor Mutational Burden - 4 Muts/Mb Genomic Findings For a complete list of the genes assayed, please refer to the Appendix. ATM G204* KRAS G12C PDGFRA P122T SMO R199W 7 Disease relevant genes with no reportable alterations: ALK, BRAF, EGFR, ERBB2, MET, RET, ROS1  PDL 1 expression was 1%  PRIOR THERAPY:  1) SRS to multiple brain metastasis under the care of Dr. Isidore Moos. 2) SRS to a 4 mm new brain metastasis under the care of Dr. Isidore Moos and Dr. Saintclair Halsted on 09/02/2020  CURRENT THERAPY: Systemic chemotherapy with carboplatin for AUC of 5, Alimta 500 mg/M2 and Keytruda 200 mg IV every 3 weeks.  First dose October 05, 2018.  Status post 48 cycles.  Starting from cycle #5 the patient will be on treatment with maintenance Alimta 500 mg/M2 and Keytruda 200 mg IV every 3 weeks.  She has been on treatment with single agent Keytruda recently secondary to renal insufficiency.  INTERVAL HISTORY: Carolyn [name] 66 y.o. female returns to the clinic today for follow-up visit.  The patient is feeling fine today with no concerning complaints except for arthralgia in her hands.  She was seen by rheumatology and was given a short course of prednisone taper with improvement of her symptoms.  Her rheumatologist is considering her for treatment with low-dose prednisone as well as hydroxychloroquine.  The patient denied having any chest pain, shortness of breath except with exertion with no cough or hemoptysis.  She denied  having any fever or chills.  She has no nausea, vomiting, diarrhea or constipation.  She has no headache or visual changes.  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  MEDICATIONS:  Current Outpatient Medications  Medication Sig Dispense Refill   acetaminophen (TYLENOL) 500 MG tablet Take 1,000 mg by mouth every 8 (eight) hours as needed for mild pain, fever or headache.      amLODipine (NORVASC) 10 MG tablet Take 1 tablet by mouth once daily (Patient taking differently: Take 10 mg by mouth daily with supper.) 90 tablet 0   aspirin EC 81 MG tablet Take 81 mg by mouth daily.     carvedilol (COREG) 25  MG tablet Take 1/2 (one-half) tablet by mouth twice daily 180 tablet 0   Cholecalciferol (VITAMIN D-3) 125 MCG (5000 UT) TABS Take 5,000 Units by mouth daily.     empagliflozin (JARDIANCE) 25 MG TABS tablet Take 1 tablet (25 mg total) by mouth daily before breakfast. 90 tablet 3   esomeprazole (NEXIUM) 20 MG capsule Take 20 mg by mouth daily with breakfast.     ezetimibe (ZETIA) 10 MG tablet TAKE 1 TABLET BY MOUTH ONCE DAILY AFTER SUPPER 90 tablet 3   fexofenadine (ALLEGRA) 180 MG tablet Take 180 mg by mouth daily.     fluticasone (FLONASE) 50 MCG/ACT nasal spray Place 1 spray into both nostrils daily as needed for allergies or rhinitis.      Lancets (ONETOUCH DELICA PLUS [id number]) MISC SMARTSIG:1 Topical 3 Times Daily     levocetirizine (XYZAL) 5 MG tablet Take 5 mg by mouth daily as needed for allergies.     lidocaine-prilocaine (EMLA) cream Apply to the Port-A-Cath site 30 to 60 minutes before chemotherapy. 30 g 2   metFORMIN (GLUCOPHAGE) 500 MG tablet Take 500 mg by mouth daily with supper.     methocarbamol (ROBAXIN) 500 MG tablet Take 1 tablet (500 mg total) by mouth every 8 (eight) hours as needed for muscle spasms. 30 tablet 0   montelukast (SINGULAIR) 10 MG tablet Take 10 mg by mouth daily as needed (allergies).     nitroGLYCERIN (NITROSTAT) 0.4 MG SL tablet Place 1 tablet (0.4 mg total) under the tongue every 5 (five) minutes as needed for chest pain. 25 tablet 2   ONETOUCH ULTRA test strip USE 1 STRIP TO CHECK GLUCOSE THREE TIMES DAILY     oxyCODONE (OXY IR/ROXICODONE) 5 MG immediate release tablet Take 1 tablet (5 mg total) by mouth every 6 (six) hours as needed for severe pain. (Patient not taking: Reported on 06/24/2021) 40 tablet 0   pentoxifylline (TRENTAL) 400 MG CR tablet Take 451m tab daily x 1 week, then 4024mBID; take with food (Patient taking differently: 400 mg 2 (two) times daily with breakfast and lunch.) 60 tablet 5   Pitavastatin Calcium (LIVALO) 4 MG TABS Take 1 tablet (4 mg total) by mouth at bedtime. 90 tablet 1   Polyethyl Glycol-Propyl Glycol (SYSTANE) 0.4-0.3 % GEL ophthalmic gel Place 1 application. into both eyes every 6 (six) hours as needed (dry eyes).     predniSONE (DELTASONE) 5 MG tablet See admin instructions.     telmisartan (MICARDIS) 40 MG tablet Take 40 mg by mouth daily.     vitamin E 180 MG (400 UNITS) capsule Take 400 IU daily x 1 week, then 400 IU BID (Patient taking differently: Take 400 Units by mouth in the morning and at bedtime.) 60 capsule 5   No current facility-administered medications for this visit.    SURGICAL HISTORY:  Past Surgical History:  Procedure Laterality Date    ABDOMINAL HYSTERECTOMY     bone spur     CARDIAC CATHETERIZATION  06/13/2012   carpel tunnel surgery Left    COLONOSCOPY     9 years ago   CORONARY ANGIOPLASTY  06/13/2012   EYE SURGERY     cyst left eye    IR IMAGING GUIDED PORT INSERTION  10/10/2018   LEFT HEART CATHETERIZATION WITH CORONARY ANGIOGRAM N/A 06/13/2012   Procedure: LEFT HEART CATHETERIZATION WITH CORONARY ANGIOGRAM;  Surgeon: JaLaverda PageMD;  [facility];  Service: Cardiovascular;  Laterality: N/A;   NECK SURGERY  rotator cuff surgery Right 2015   SHOULDER ARTHROSCOPY WITH ROTATOR CUFF REPAIR AND SUBACROMIAL DECOMPRESSION Left 05/14/2021   Procedure: LEFT SHOULDER ARTHROSCOPY, SUBACROMIAL DECOMPRESSION, BICEPS TENODESIS, MINI OPEN ROTATOR CUFF REPAIR;  Surgeon: Meredith Pel, MD;  Location: [facility];  Service: Orthopedics;  Laterality: Left;   VIDEO BRONCHOSCOPY WITH ENDOBRONCHIAL NAVIGATION N/A 09/06/2018   Procedure: VIDEO BRONCHOSCOPY WITH ENDOBRONCHIAL NAVIGATION, left upper lung;  Surgeon: Lajuana Matte, MD;  Location: [facility];  Service: Thoracic;  Laterality: N/A;   VIDEO BRONCHOSCOPY WITH ENDOBRONCHIAL ULTRASOUND Left 09/06/2018   Procedure: VIDEO BRONCHOSCOPY WITH ENDOBRONCHIAL ULTRASOUND, left lung;  Surgeon: Lajuana Matte, MD;  Location: [facility];  Service: Thoracic;  Laterality: Left;   WISDOM TOOTH EXTRACTION      REVIEW OF SYSTEMS:  A comprehensive review of systems was negative except for: Respiratory: positive for dyspnea on exertion Musculoskeletal: positive for arthralgias   PHYSICAL EXAMINATION: General appearance: alert, cooperative, and no distress Head: Normocephalic, without obvious abnormality, atraumatic Neck: no adenopathy, no JVD, supple, symmetrical, trachea midline, and thyroid not enlarged, symmetric, no tenderness/mass/nodules Lymph nodes: Cervical, supraclavicular, and axillary nodes normal. Resp: clear to auscultation bilaterally Back: symmetric, no curvature. ROM  normal. No CVA tenderness. Cardio: regular rate and rhythm, S1, S2 normal, no murmur, click, rub or gallop GI: soft, non-tender; bowel sounds normal; no masses,  no organomegaly Extremities: extremities normal, atraumatic, no cyanosis or edema  ECOG PERFORMANCE STATUS: 1 - Symptomatic but completely ambulatory  Blood pressure 133/70, pulse 60, temperature 97.8 F (36.6 C), temperature source Tympanic, resp. rate 16, height _0  (1.549 m), weight 181 lb 6.4 oz (82.3 kg), SpO2 99 %.  LABORATORY DATA: Lab Results  Component Value Date   WBC 15.0 (H) 06/17/2021   HGB 12.4 06/17/2021   HCT 40.4 06/17/2021   MCV 79.5 (L) 06/17/2021   PLT 267 06/17/2021      Chemistry      Component Value Date/Time   NA 140 06/17/2021 0734   NA 139 08/18/2020 0801   K 3.6 06/17/2021 0734   CL 105 06/17/2021 0734   CO2 25 06/17/2021 0734   BUN 22 06/17/2021 0734   BUN 16 08/18/2020 0801   CREATININE 1.60 (H) 06/17/2021 0734      Component Value Date/Time   CALCIUM 8.9 06/17/2021 0734   ALKPHOS 75 06/17/2021 0734   AST 13 (L) 06/17/2021 0734   ALT 13 06/17/2021 0734   BILITOT 0.3 06/17/2021 0734       RADIOGRAPHIC STUDIES: MR Brain W Wo Contrast  Result Date: 06/20/2021 CLINICAL DATA:  Brain metastases, assess treatment response. EXAM: MRI HEAD WITHOUT AND WITH CONTRAST TECHNIQUE: Multiplanar, multiecho pulse sequences of the brain and surrounding structures were obtained without and with intravenous contrast. CONTRAST:  46m MAGNEVIST GADOPENTETATE DIMEGLUMINE 469.01 MG/ML IV SOLN COMPARISON:  03/19/2021 FINDINGS: BRAIN New Lesions: None. Larger lesions: None. Stable or Smaller lesions: Anterior left temporal lobe metastasis measures 16 x 9 x 19 mm, within 1 mm of prior. Mild adjacent vasogenic edema is diminished. A superior right frontal nodule on 12:127 measures 5 mm, stable. Other Brain findings: Scattered FLAIR hyperintensities in the cerebral white matter are non progressed. Unchanged  encephalomalacia in the bilateral cerebellum. No infarct, hemorrhage, hydrocephalus, or collection. Vascular: Normal flow voids and vascular enhancements. Skull and upper cervical spine: Negative for marrow lesion. Cervical spine degeneration and C3-4 ACDF. Sinuses/Orbits: Negative IMPRESSION: 1. Essentially size stable left temporal metastasis with decreased vasogenic edema. 2. Stable subcentimeter high right frontal  metastasis. 3. No new lesion. Electronically Signed   By: Jorje Guild M.D.   On: 06/20/2021 04:05     ASSESSMENT AND PLAN: This is a very pleasant 66 years old African-American female with likely stage IV (T1c, N1, M1C) non-small cell lung cancer, adenocarcinoma with positive KRAS G12C mutation diagnosed in July 2020 and presented with left upper lobe lung nodule in addition to right upper lobe pulmonary nodule as well as left hilar adenopathy and metastatic lesion to the brain. Molecular studies showed no actionable mutation and PDL 1 expression was 1%. The patient underwent SBRT to the metastatic brain lesion under the care of Dr. Isidore Moos. The patient is currently undergoing systemic chemotherapy with carboplatin for AUC of 5, Alimta 500 mg/M2 and Keytruda 200 mg IV every 3 weeks is status post 48 cycles.  Starting from cycle #5 she is on maintenance treatment with Alimta and Keytruda every 3 weeks with only Keytruda on the last few cycles because of the renal insufficiency. The patient has been tolerating this treatment well with no concerning adverse effect except for mild fatigue and arthralgia. I recommended for her to proceed with cycle #49 today as planned. I will see her back for follow-up visit in 3 weeks for evaluation before the next cycle of her treatment. For the arthritis, she was seen by rheumatology and expected to start treatment with hydroxychloroquine as well as low-dose prednisone.  I explained to the patient that I do not see any concern as long as the dose of  prednisone is 10 mg or less. For the left shoulder pain, she recently underwent surgery on 05/14/2021 under the care of Dr. Marlou Sa for rotator cuff tear. For the renal insufficiency, she is followed by her primary care physician and nephrology. She was advised to call immediately if she has any other concerning symptoms in the interval. The patient voices understanding of current disease status and treatment options and is in agreement with the current care plan. All questions were answered. The patient knows to call the clinic with any problems, questions or concerns. We can certainly see the patient much sooner if necessary.  Disclaimer: This note was dictated with voice recognition software. Similar sounding words can inadvertently be transcribed and may not be corrected upon review.

## 2021-07-09 DIAGNOSIS — S43422D Sprain of left rotator cuff capsule, subsequent encounter: Secondary | ICD-10-CM | POA: Diagnosis not present

## 2021-07-14 DIAGNOSIS — S43422D Sprain of left rotator cuff capsule, subsequent encounter: Secondary | ICD-10-CM | POA: Diagnosis not present

## 2021-07-16 DIAGNOSIS — S43422D Sprain of left rotator cuff capsule, subsequent encounter: Secondary | ICD-10-CM | POA: Diagnosis not present

## 2021-07-18 ENCOUNTER — Other Ambulatory Visit: Payer: Self-pay | Admitting: Cardiology

## 2021-07-18 DIAGNOSIS — E7800 Pure hypercholesterolemia, unspecified: Secondary | ICD-10-CM

## 2021-07-23 ENCOUNTER — Other Ambulatory Visit: Payer: Self-pay | Admitting: Cardiology

## 2021-07-23 DIAGNOSIS — I10 Essential (primary) hypertension: Secondary | ICD-10-CM

## 2021-07-23 NOTE — Progress Notes (Signed)
Carolyn [name] OFFICE PROGRESS NOTE  Carolyn [name], Carolyn [name] [id number]  DIAGNOSIS:  Stage IV (T1c, N1, M1c ) non-small cell lung cancer, adenocarcinoma diagnosed in July 2020 and presented with left upper lobe lung nodule in addition to right upper lobe lung nodule with left hilar lymphadenopathy as well as multiple brain metastasis.   Biomarker Findings Microsatellite status - MS-Stable Tumor Mutational Burden - 4 Muts/Mb Genomic Findings For a complete list of the genes assayed, please refer to the Appendix. ATM G204* KRAS G12C PDGFRA P122T SMO R199W 7 Disease relevant genes with no reportable alterations: ALK, BRAF, EGFR, ERBB2, MET, RET, ROS1   PDL 1 expression was 1%  PRIOR THERAPY: 1) SBRT to multiple brain metastasis under the care of Dr. Isidore Moos. Completed in August 2020 2) SRS to the new metastatic brain lesions under the care of Dr. Saintclair Halsted and Dr. Isidore Moos on 09/02/20 and 12/16/20  CURRENT THERAPY: Systemic chemotherapy with carboplatin for AUC of 5, Alimta 500 mg/M2 and Keytruda 200 mg IV every 3 weeks.  First dose October 05, 2018.  Status post 49 cycles.  Starting from cycle #5 the patient will be on treatment with maintenance Alimta 500 mg/M2 and Keytruda 200 mg IV every 3 weeks.  She has been on treatment with single agent Keytruda secondary to renal insufficiency  INTERVAL HISTORY: Carolyn [name] 66 y.o. female returns  to the clinic today for a follow-up visit. The patient is feeling fairly well today without any concerning complaints. Otherwise, the patient denies any concerning complaints today.  Denies any recent fever, chills, or weight loss. She reports her that baseline night sweats approximately 1x per week have gotten much better. She denies any chest pain, shortness of breath, cough, or hemoptysis.  Denies any nausea, vomiting, or diarrhea. She does have occasional constipation for which she takes an OTC  laxative. She states her bowel movements have never been very regular and she has a bowel movement about every 2-3 days.  Denies any rashes or skin changes.  She denies any recent headache or visual changes.  She follows closely with radiation oncology regarding her history of metastatic disease to the brain. She is followed closely by nephrology due to her renal insufficiency.  Her chemotherapy with Alimta was discontinued due to renal insufficiency.  She is here today for evaluation and repeat blood work before starting cycle #50.  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  MEDICATIONS:  Current Outpatient Medications  Medication Sig Dispense Refill   acetaminophen (TYLENOL) 500 MG tablet Take 1,000 mg by mouth  every 8 (eight) hours as needed for mild pain, fever or headache.      amLODipine (NORVASC) 10 MG tablet Take 1 tablet by mouth once daily 90 tablet 0   aspirin EC 81 MG tablet Take 81 mg by mouth daily.     carvedilol (COREG) 25 MG tablet Take 1/2 (one-half) tablet by mouth twice daily 180 tablet 0   Cholecalciferol (VITAMIN D-3) 125 MCG (5000 UT) TABS Take 5,000 Units by mouth daily.     empagliflozin (JARDIANCE) 25 MG TABS tablet Take 1 tablet (25 mg total) by mouth daily before breakfast. 90 tablet 3   esomeprazole (NEXIUM) 20 MG  capsule Take 20 mg by mouth daily with breakfast.     ezetimibe (ZETIA) 10 MG tablet TAKE 1 TABLET BY MOUTH ONCE DAILY AFTER SUPPER 90 tablet 0   fexofenadine (ALLEGRA) 180 MG tablet Take 180 mg by mouth daily.     fluticasone (FLONASE) 50 MCG/ACT nasal spray Place 1 spray into both nostrils daily as needed for allergies or rhinitis.     Lancets (ONETOUCH DELICA PLUS [id number]) MISC SMARTSIG:1 Topical 3 Times Daily     levocetirizine (XYZAL) 5 MG tablet Take 5 mg by mouth daily as needed for allergies.     lidocaine-prilocaine (EMLA) cream Apply to the Port-A-Cath site 30 to 60 minutes before chemotherapy. 30 g 2   metFORMIN (GLUCOPHAGE) 500 MG tablet Take 500 mg by mouth daily with supper.     methocarbamol (ROBAXIN) 500 MG tablet Take 1 tablet (500 mg total) by mouth every 8 (eight) hours as needed for muscle spasms. 30 tablet 0   montelukast (SINGULAIR) 10 MG tablet Take 10 mg by mouth daily as needed (allergies).     nitroGLYCERIN (NITROSTAT) 0.4 MG SL tablet Place 1 tablet (0.4 mg total) under the tongue every 5 (five) minutes as needed for chest pain. 25 tablet 2   ONETOUCH ULTRA test strip USE 1 STRIP TO CHECK GLUCOSE THREE TIMES DAILY     oxyCODONE (OXY IR/ROXICODONE) 5 MG immediate release tablet Take 1 tablet (5 mg total) by mouth every 6 (six) hours as needed for severe pain. (Patient not taking: Reported on 06/24/2021) 40 tablet 0   pentoxifylline (TRENTAL) 400 MG CR tablet Take $RemoveBef'400mg'MrqNRhQhxN$  tab daily x 1 week, then $RemoveBe'400mg'aKcNpQneB$  BID; take with food (Patient taking differently: 400 mg 2 (two) times daily with breakfast and lunch.) 60 tablet 5   Pitavastatin Calcium (LIVALO) 4 MG TABS Take 1 tablet (4 mg total) by mouth at bedtime. 90 tablet 1   Polyethyl Glycol-Propyl Glycol (SYSTANE) 0.4-0.3 % GEL ophthalmic gel Place 1 application. into both eyes every 6 (six) hours as needed (dry eyes).     predniSONE (DELTASONE) 5 MG tablet See admin instructions.     telmisartan (MICARDIS) 40 MG tablet Take 40 mg  by mouth daily.     vitamin E 180 MG (400 UNITS) capsule Take 400 IU daily x 1 week, then 400 IU BID (Patient taking differently: Take 400 Units by mouth in the morning and at bedtime.) 60 capsule 5   No current facility-administered medications for this visit.    SURGICAL HISTORY:  Past Surgical History:  Procedure Laterality Date   ABDOMINAL HYSTERECTOMY     bone spur     CARDIAC CATHETERIZATION  06/13/2012   carpel tunnel surgery Left    COLONOSCOPY     9 years ago   CORONARY ANGIOPLASTY  06/13/2012   EYE SURGERY     cyst left  eye    IR IMAGING GUIDED PORT INSERTION  10/10/2018   LEFT HEART CATHETERIZATION WITH CORONARY ANGIOGRAM N/A 06/13/2012   Procedure: LEFT HEART CATHETERIZATION WITH CORONARY ANGIOGRAM;  Surgeon: Pamella Pert, MD;  Location[facility] CATH LAB;  Service: Cardiovascular;  Laterality: N/A;   NECK SURGERY     rotator cuff surgery Right 2015   SHOULDER ARTHROSCOPY WITH ROTATOR CUFF REPAIR AND SUBACROMIAL DECOMPRESSION Left 05/14/2021   Procedure: LEFT SHOULDER ARTHROSCOPY, SUBACROMIAL DECOMPRESSION, BICEPS TENODESIS, MINI OPEN ROTATOR CUFF REPAIR;  Surgeon: Cammy Copa, MD;  Location: [facility] OR;  Service: Orthopedics;  Laterality: Left;   VIDEO BRONCHOSCOPY WITH ENDOBRONCHIAL NAVIGATION N/A 09/06/2018   Procedure: VIDEO BRONCHOSCOPY WITH ENDOBRONCHIAL NAVIGATION, left upper lung;  Surgeon: Corliss Skains, MD;  Location: [facility] OR;  Service: Thoracic;  Laterality: N/A;   VIDEO BRONCHOSCOPY WITH ENDOBRONCHIAL ULTRASOUND Left 09/06/2018   Procedure: VIDEO BRONCHOSCOPY WITH ENDOBRONCHIAL ULTRASOUND, left lung;  Surgeon: Corliss Skains, MD;  Location: [facility] OR;  Service: Thoracic;  Laterality: Left;   WISDOM TOOTH EXTRACTION      REVIEW OF SYSTEMS:   Review of Systems  Constitutional: Negative for appetite change, chills, fatigue, fever and unexpected weight change.  HENT:   Negative for mouth sores, nosebleeds, sore throat and trouble swallowing.   Eyes: Negative for  eye problems and icterus.  Respiratory: Negative for cough, hemoptysis, shortness of breath and wheezing.   Cardiovascular: Negative for chest pain and leg swelling.  Gastrointestinal: Negative for abdominal pain, constipation, diarrhea, nausea and vomiting.  Genitourinary: Negative for bladder incontinence, difficulty urinating, dysuria, frequency and hematuria.   Musculoskeletal: Negative for back pain, gait problem, neck pain and neck stiffness.  Skin: Negative for itching and rash.  Neurological: Negative for dizziness, extremity weakness, gait problem, headaches, light-headedness and seizures.  Hematological: Negative for adenopathy. Does not bruise/bleed easily.  Psychiatric/Behavioral: Negative for confusion, depression and sleep disturbance. The patient is not nervous/anxious.     PHYSICAL EXAMINATION:  There were no vitals taken for this visit.  ECOG PERFORMANCE STATUS: {CHL ONC ECOG [id number]  Physical Exam  Constitutional: Oriented to person, place, and time and well-developed, well-nourished, and in no distress. No distress.  HENT:  Head: Normocephalic and atraumatic.  Mouth/Throat: Oropharynx is clear and moist. No oropharyngeal exudate.  Eyes: Conjunctivae are normal. Right eye exhibits no discharge. Left eye exhibits no discharge. No scleral icterus.  Neck: Normal range of motion. Neck supple.  Cardiovascular: Normal rate, regular rhythm, normal heart sounds and intact distal pulses.   Pulmonary/Chest: Effort normal and breath sounds normal. No respiratory distress. No wheezes. No rales.  Abdominal: Soft. Bowel sounds are normal. Exhibits no distension and no mass. There is no tenderness.  Musculoskeletal: Normal range of motion. Exhibits no edema.  Lymphadenopathy:    No cervical adenopathy.  Neurological: Alert and oriented to person, place, and time. Exhibits normal muscle tone. Gait normal. Coordination normal.  Skin: Skin is warm and dry. No rash noted. Not  diaphoretic. No erythema. No pallor.  Psychiatric: Mood, memory and judgment normal.  Vitals reviewed.  LABORATORY DATA: Lab Results  Component Value Date   WBC 5.4 07/08/2021   HGB 11.6 (L) 07/08/2021   HCT 37.1 07/08/2021   MCV 79.3 (L) 07/08/2021   PLT 280 07/08/2021      Chemistry      Component Value Date/Time   NA 141 07/08/2021 0759   NA 139 08/18/2020 0801   K 3.7 07/08/2021 0759   CL 108 07/08/2021 0759  CO2 26 07/08/2021 0759   BUN 15 07/08/2021 0759   BUN 16 08/18/2020 0801   CREATININE 1.60 (H) 07/08/2021 0759      Component Value Date/Time   CALCIUM 9.2 07/08/2021 0759   ALKPHOS 72 07/08/2021 0759   AST 17 07/08/2021 0759   ALT 10 07/08/2021 0759   BILITOT 0.4 07/08/2021 0759       RADIOGRAPHIC STUDIES:  No results found.   ASSESSMENT/PLAN:  This is a very pleasant 66 year old African-American female with stage IV (T1c, N1, M1c) non-small cell lung cancer, adenocarcinoma. She presented with a dominant left upper lobe lung nodule in addition to a right upper lobe pulmonary nodule with left hilar lymphadenopathy. She also has metastatic disease to the brain. She has no actionable mutations and her PDL1 expression is 1%. She was diagnosed in July 2020. She also has KRASG12C which is a targetable mutation that can be used in the second line setting.    The patient completed SBRT to the metastatic brain lesion under the care of Dr. Isidore Moos in August 2020. She also had SRS again on 09/02/20 and 12/16/20.   The patient is currently undergoing systemic chemotherapy with carboplatin for an AUC of 5, Alimta 500 mg/m, and Keytruda 200 mg IV every 3 weeks.  She is status post 49 cycles of treatment.  Starting from cycle #5, she has been on maintenance Alimta 500 mg/m2 and Keytruda 200 mg IV. She tolerated her treatment well without any concerning adverse side effects. Alimta was discontinued due to renal insufficiency.    Labs were reviewed. Her creatinine is ***  today. She has baseline CKD and follows closely with nephrology. Recommend that she proceed with cycle #50 today.    We will see her back for a follow up visit in 3 weeks for evaluation before starting cycle #49               No orders of the defined types were placed in this encounter.    I spent {CHL ONC TIME VISIT - VANVB:[id number]} counseling the patient face to face. The total time spent in the appointment was {CHL ONC TIME VISIT - XHFSF:[id number]}.  Carolyn Matin L Doye Montilla, PA-C 07/23/21

## 2021-07-28 ENCOUNTER — Inpatient Hospital Stay (HOSPITAL_BASED_OUTPATIENT_CLINIC_OR_DEPARTMENT_OTHER): Payer: Medicare Other | Admitting: Physician Assistant

## 2021-07-28 ENCOUNTER — Other Ambulatory Visit: Payer: Medicare Other

## 2021-07-28 ENCOUNTER — Inpatient Hospital Stay: Payer: Medicare Other | Attending: Physician Assistant

## 2021-07-28 ENCOUNTER — Inpatient Hospital Stay: Payer: Medicare Other

## 2021-07-28 ENCOUNTER — Ambulatory Visit: Payer: Medicare Other | Admitting: Physician Assistant

## 2021-07-28 ENCOUNTER — Other Ambulatory Visit: Payer: Self-pay

## 2021-07-28 ENCOUNTER — Ambulatory Visit: Payer: Medicare Other

## 2021-07-28 VITALS — BP 142/72 | HR 65 | Temp 98.5°F | Resp 15 | Wt 181.5 lb

## 2021-07-28 DIAGNOSIS — C3491 Malignant neoplasm of unspecified part of right bronchus or lung: Secondary | ICD-10-CM

## 2021-07-28 DIAGNOSIS — C3492 Malignant neoplasm of unspecified part of left bronchus or lung: Secondary | ICD-10-CM

## 2021-07-28 DIAGNOSIS — Z5112 Encounter for antineoplastic immunotherapy: Secondary | ICD-10-CM

## 2021-07-28 DIAGNOSIS — R5383 Other fatigue: Secondary | ICD-10-CM

## 2021-07-28 DIAGNOSIS — Z79899 Other long term (current) drug therapy: Secondary | ICD-10-CM | POA: Diagnosis not present

## 2021-07-28 DIAGNOSIS — C3412 Malignant neoplasm of upper lobe, left bronchus or lung: Secondary | ICD-10-CM | POA: Diagnosis not present

## 2021-07-28 DIAGNOSIS — C7931 Secondary malignant neoplasm of brain: Secondary | ICD-10-CM | POA: Diagnosis not present

## 2021-07-28 DIAGNOSIS — Z95828 Presence of other vascular implants and grafts: Secondary | ICD-10-CM

## 2021-07-28 LAB — CBC WITH DIFFERENTIAL (CANCER CENTER ONLY)
Abs Immature Granulocytes: 0.07 10*3/uL (ref 0.00–0.07)
Basophils Absolute: 0 10*3/uL (ref 0.0–0.1)
Basophils Relative: 0 %
Eosinophils Absolute: 0 10*3/uL (ref 0.0–0.5)
Eosinophils Relative: 0 %
HCT: 38.7 % (ref 36.0–46.0)
Hemoglobin: 11.9 g/dL — ABNORMAL LOW (ref 12.0–15.0)
Immature Granulocytes: 1 %
Lymphocytes Relative: 8 %
Lymphs Abs: 0.9 10*3/uL (ref 0.7–4.0)
MCH: 24.4 pg — ABNORMAL LOW (ref 26.0–34.0)
MCHC: 30.7 g/dL (ref 30.0–36.0)
MCV: 79.5 fL — ABNORMAL LOW (ref 80.0–100.0)
Monocytes Absolute: 0.8 10*3/uL (ref 0.1–1.0)
Monocytes Relative: 7 %
Neutro Abs: 9.4 10*3/uL — ABNORMAL HIGH (ref 1.7–7.7)
Neutrophils Relative %: 84 %
Platelet Count: 219 10*3/uL (ref 150–400)
RBC: 4.87 MIL/uL (ref 3.87–5.11)
RDW: 17.6 % — ABNORMAL HIGH (ref 11.5–15.5)
WBC Count: 11.1 10*3/uL — ABNORMAL HIGH (ref 4.0–10.5)
nRBC: 0 % (ref 0.0–0.2)

## 2021-07-28 LAB — CMP (CANCER CENTER ONLY)
ALT: 14 U/L (ref 0–44)
AST: 14 U/L — ABNORMAL LOW (ref 15–41)
Albumin: 3.7 g/dL (ref 3.5–5.0)
Alkaline Phosphatase: 62 U/L (ref 38–126)
Anion gap: 7 (ref 5–15)
BUN: 28 mg/dL — ABNORMAL HIGH (ref 8–23)
CO2: 26 mmol/L (ref 22–32)
Calcium: 9.2 mg/dL (ref 8.9–10.3)
Chloride: 107 mmol/L (ref 98–111)
Creatinine: 1.71 mg/dL — ABNORMAL HIGH (ref 0.44–1.00)
GFR, Estimated: 32 mL/min — ABNORMAL LOW (ref >60)
Glucose, Bld: 108 mg/dL — ABNORMAL HIGH (ref 70–99)
Potassium: 4.1 mmol/L (ref 3.5–5.1)
Sodium: 140 mmol/L (ref 135–145)
Total Bilirubin: 0.3 mg/dL (ref 0.3–1.2)
Total Protein: 6.7 g/dL (ref 6.5–8.1)

## 2021-07-28 LAB — TSH: TSH: 1.177 u[IU]/mL (ref 0.350–4.500)

## 2021-07-28 MED ORDER — SODIUM CHLORIDE 0.9 % IV SOLN
200.0000 mg | Freq: Once | INTRAVENOUS | Status: AC
  Administered 2021-07-28: 200 mg via INTRAVENOUS
  Filled 2021-07-28: qty 200

## 2021-07-28 MED ORDER — SODIUM CHLORIDE 0.9% FLUSH
10.0000 mL | INTRAVENOUS | Status: DC | PRN
  Administered 2021-07-28: 10 mL

## 2021-07-28 MED ORDER — SODIUM CHLORIDE 0.9 % IV SOLN: Freq: Once | INTRAVENOUS | Status: AC

## 2021-07-28 NOTE — Progress Notes (Signed)
Ok to treat with elevated Scr per Provider.

## 2021-08-13 ENCOUNTER — Ambulatory Visit (INDEPENDENT_AMBULATORY_CARE_PROVIDER_SITE_OTHER): Payer: Medicare Other | Admitting: Orthopedic Surgery

## 2021-08-13 ENCOUNTER — Encounter: Payer: Self-pay | Admitting: Orthopedic Surgery

## 2021-08-13 DIAGNOSIS — M75122 Complete rotator cuff tear or rupture of left shoulder, not specified as traumatic: Secondary | ICD-10-CM

## 2021-08-13 NOTE — Progress Notes (Signed)
Post-Op Visit Note   Patient: Carolyn [name]           Date of Birth: 1955-12-03           MRN: [id number] Visit Date: 08/13/2021 PCP: [name], FNP   Assessment & Plan:  Chief Complaint:  Chief Complaint  Patient presents with   Left Shoulder - Follow-up   Visit Diagnoses:  1. Complete tear of left rotator cuff, unspecified whether traumatic     Plan: Patient is now 12 weeks out left shoulder arthroscopy with subacromial decompression biceps tenodesis and mini open rotator cuff tear repair patient did 2 weeks of physical therapy.  She had a little bit more pain in the shoulder over the last 1 to 2 weeks.  Denies any history of injury.  Reports pain laterally.  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  Her new pain is not affecting her sleep.  Takes occasional Tylenol for the symptoms.  On examination she has a well-maintained passive range of motion of 60/95/160.  She does have a little bit more crepitus but good rotator cuff strength infraspinatus and subscap testing as well as supraspinatus testing.  Plan at this time is to avoid lifting out in front of her body.  Okay for stretching exercises.  Come back in 2 months for clinical recheck and decision for or against injection at that time.  Follow-Up Instructions: Return in about 8 weeks (around 10/08/2021).   Orders:  No orders of the defined types were placed in this encounter.  No orders of the defined types were placed in this encounter.   Imaging: No results found.  PMFS History: Patient Active Problem List   Diagnosis Date Noted   Complete tear of left rotator cuff    Biceps tendonitis, left    Degenerative superior labral anterior-to-posterior (SLAP) tear of left shoulder    Elevated serum creatinine 04/12/2019   Pyelonephritis 02/08/2019   Port-A-Cath in place 10/19/2018   Encounter for antineoplastic chemotherapy 09/28/2018   Encounter for antineoplastic immunotherapy  09/28/2018   Goals of care, counseling/discussion 09/28/2018   Adenocarcinoma, lung ([place]) 09/12/2018   Malignant neoplasm metastatic to brain [facility]) 08/30/2018   Mass of upper lobe of left lung 08/17/2018   Angina pectoris ([place]) 06/14/2012   CAD (coronary artery disease), native coronary artery 06/14/2012   S/P PTCA (percutaneous transluminal coronary angioplasty) 06/14/2012   Hyperlipidemia 06/14/2012   Essential hypertension 06/14/2012   Pre-diabetes 06/14/2012   Past Medical History:  Diagnosis Date   Anginal pain ([place])    " with exertion "   Arthritis    " 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 "   Asthma    h/o   Brain metastases 08/2018   Chronic kidney disease    Coronary artery disease    Diabetes mellitus without complication (HCC)    Type II   GERD (gastroesophageal reflux disease)    H/O hiatal hernia    Heart murmur    History of radiation therapy 09/22/2018   stereotactic radiosurgery    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   Hypertension    nscl ca dx'd 08/07/18   Persistent headaches    h/o   Pneumonia    hx of PNA   Shortness of breath    Vaginal dryness    h/o    Family History  Problem Relation Age of Onset   Breast cancer Other    Diabetes Mother    Glaucoma  Mother    Hypertension Mother    Hypertension Father    Asthma Father    Diabetes Sister    Diabetes Brother    Hypertension Sister     Past Surgical History:  Procedure Laterality Date   ABDOMINAL HYSTERECTOMY     bone spur     CARDIAC CATHETERIZATION  06/13/2012   carpel tunnel surgery Left    COLONOSCOPY     9 years ago   CORONARY ANGIOPLASTY  06/13/2012   EYE SURGERY     cyst left eye    IR IMAGING GUIDED PORT INSERTION  10/10/2018   LEFT HEART CATHETERIZATION WITH CORONARY ANGIOGRAM N/A 06/13/2012   Procedure: LEFT HEART CATHETERIZATION WITH CORONARY ANGIOGRAM;  Surgeon: Laverda Page, MD;  Location: [facility] CATH LAB;  Service: Cardiovascular;  Laterality: N/A;   NECK SURGERY     rotator cuff surgery Right 2015    SHOULDER ARTHROSCOPY WITH ROTATOR CUFF REPAIR AND SUBACROMIAL DECOMPRESSION Left 05/14/2021   Procedure: LEFT SHOULDER ARTHROSCOPY, SUBACROMIAL DECOMPRESSION, BICEPS TENODESIS, MINI OPEN ROTATOR CUFF REPAIR;  Surgeon: Meredith Pel, MD;  Location: [place];  Service: Orthopedics;  Laterality: Left;   VIDEO BRONCHOSCOPY WITH ENDOBRONCHIAL NAVIGATION N/A 09/06/2018   Procedure: VIDEO BRONCHOSCOPY WITH ENDOBRONCHIAL NAVIGATION, left upper lung;  Surgeon: Lajuana Matte, MD;  Location: [facility] OR;  Service: Thoracic;  Laterality: N/A;   VIDEO BRONCHOSCOPY WITH ENDOBRONCHIAL ULTRASOUND Left 09/06/2018   Procedure: VIDEO BRONCHOSCOPY WITH ENDOBRONCHIAL ULTRASOUND, left lung;  Surgeon: Lajuana Matte, MD;  Location: [facility] OR;  Service: Thoracic;  Laterality: Left;   WISDOM TOOTH EXTRACTION     Social History   Occupational History   Not on file  Tobacco Use   Smoking status: Former    Packs/day: 0.50    Years: 30.00    Total pack years: 15.00    Types: Cigarettes    Quit date: 02/09/1999    Years since quitting: 22.5   Smokeless tobacco: Never  Vaping Use   Vaping Use: Never used  Substance and Sexual Activity   Alcohol use: Not Currently   Drug use: Not Currently   Sexual activity: Yes    Birth control/protection: Post-menopausal

## 2021-08-14 ENCOUNTER — Encounter (HOSPITAL_COMMUNITY): Payer: Self-pay

## 2021-08-14 ENCOUNTER — Ambulatory Visit (HOSPITAL_COMMUNITY)
Admission: RE | Admit: 2021-08-14 | Discharge: 2021-08-14 | Disposition: A | Discharge status: Home / Self Care | Payer: Medicare Other | Source: Ambulatory Visit | Attending: Physician Assistant | Admitting: Physician Assistant

## 2021-08-14 DIAGNOSIS — R911 Solitary pulmonary nodule: Secondary | ICD-10-CM | POA: Diagnosis not present

## 2021-08-14 DIAGNOSIS — C3490 Malignant neoplasm of unspecified part of unspecified bronchus or lung: Secondary | ICD-10-CM | POA: Diagnosis not present

## 2021-08-14 DIAGNOSIS — C3491 Malignant neoplasm of unspecified part of right bronchus or lung: Secondary | ICD-10-CM | POA: Insufficient documentation

## 2021-08-14 DIAGNOSIS — C7931 Secondary malignant neoplasm of brain: Secondary | ICD-10-CM | POA: Diagnosis not present

## 2021-08-19 ENCOUNTER — Inpatient Hospital Stay: Payer: Medicare Other

## 2021-08-19 ENCOUNTER — Other Ambulatory Visit: Payer: Self-pay

## 2021-08-19 ENCOUNTER — Inpatient Hospital Stay: Payer: Medicare Other | Attending: Physician Assistant | Admitting: Internal Medicine

## 2021-08-19 ENCOUNTER — Encounter: Payer: Self-pay | Admitting: Internal Medicine

## 2021-08-19 VITALS — BP 122/63 | HR 65 | Temp 98.1°F | Resp 18 | Ht 61.0 in | Wt 177.4 lb

## 2021-08-19 DIAGNOSIS — C3412 Malignant neoplasm of upper lobe, left bronchus or lung: Secondary | ICD-10-CM | POA: Insufficient documentation

## 2021-08-19 DIAGNOSIS — Z5112 Encounter for antineoplastic immunotherapy: Secondary | ICD-10-CM | POA: Insufficient documentation

## 2021-08-19 DIAGNOSIS — C3491 Malignant neoplasm of unspecified part of right bronchus or lung: Secondary | ICD-10-CM

## 2021-08-19 DIAGNOSIS — Z79899 Other long term (current) drug therapy: Secondary | ICD-10-CM | POA: Diagnosis not present

## 2021-08-19 DIAGNOSIS — R5383 Other fatigue: Secondary | ICD-10-CM

## 2021-08-19 DIAGNOSIS — C7931 Secondary malignant neoplasm of brain: Secondary | ICD-10-CM | POA: Diagnosis not present

## 2021-08-19 DIAGNOSIS — C3492 Malignant neoplasm of unspecified part of left bronchus or lung: Secondary | ICD-10-CM

## 2021-08-19 DIAGNOSIS — Z95828 Presence of other vascular implants and grafts: Secondary | ICD-10-CM

## 2021-08-19 LAB — CMP (CANCER CENTER ONLY)
ALT: 14 U/L (ref 0–44)
AST: 21 U/L (ref 15–41)
Albumin: 4 g/dL (ref 3.5–5.0)
Alkaline Phosphatase: 60 U/L (ref 38–126)
Anion gap: 9 (ref 5–15)
BUN: 20 mg/dL (ref 8–23)
CO2: 27 mmol/L (ref 22–32)
Calcium: 9.4 mg/dL (ref 8.9–10.3)
Chloride: 103 mmol/L (ref 98–111)
Creatinine: 1.86 mg/dL — ABNORMAL HIGH (ref 0.44–1.00)
GFR, Estimated: 29 mL/min — ABNORMAL LOW (ref >60)
Glucose, Bld: 157 mg/dL — ABNORMAL HIGH (ref 70–99)
Potassium: 3.6 mmol/L (ref 3.5–5.1)
Sodium: 139 mmol/L (ref 135–145)
Total Bilirubin: 0.5 mg/dL (ref 0.3–1.2)
Total Protein: 7.2 g/dL (ref 6.5–8.1)

## 2021-08-19 LAB — CBC WITH DIFFERENTIAL (CANCER CENTER ONLY)
Abs Immature Granulocytes: 0.02 10*3/uL (ref 0.00–0.07)
Basophils Absolute: 0 10*3/uL (ref 0.0–0.1)
Basophils Relative: 0 %
Eosinophils Absolute: 0.2 10*3/uL (ref 0.0–0.5)
Eosinophils Relative: 3 %
HCT: 38.8 % (ref 36.0–46.0)
Hemoglobin: 12.2 g/dL (ref 12.0–15.0)
Immature Granulocytes: 0 %
Lymphocytes Relative: 16 %
Lymphs Abs: 1 10*3/uL (ref 0.7–4.0)
MCH: 25.1 pg — ABNORMAL LOW (ref 26.0–34.0)
MCHC: 31.4 g/dL (ref 30.0–36.0)
MCV: 79.7 fL — ABNORMAL LOW (ref 80.0–100.0)
Monocytes Absolute: 0.4 10*3/uL (ref 0.1–1.0)
Monocytes Relative: 7 %
Neutro Abs: 4.7 10*3/uL (ref 1.7–7.7)
Neutrophils Relative %: 74 %
Platelet Count: 266 10*3/uL (ref 150–400)
RBC: 4.87 MIL/uL (ref 3.87–5.11)
RDW: 17.1 % — ABNORMAL HIGH (ref 11.5–15.5)
WBC Count: 6.4 10*3/uL (ref 4.0–10.5)
nRBC: 0 % (ref 0.0–0.2)

## 2021-08-19 LAB — TSH: TSH: 1.164 u[IU]/mL (ref 0.350–4.500)

## 2021-08-19 MED ORDER — SODIUM CHLORIDE 0.9 % IV SOLN
200.0000 mg | Freq: Once | INTRAVENOUS | Status: AC
  Administered 2021-08-19: 200 mg via INTRAVENOUS
  Filled 2021-08-19: qty 200

## 2021-08-19 MED ORDER — HEPARIN SOD (PORK) LOCK FLUSH 100 UNIT/ML IV SOLN
500.0000 [IU] | Freq: Once | INTRAVENOUS | Status: AC | PRN
  Administered 2021-08-19: 500 [IU]

## 2021-08-19 MED ORDER — SODIUM CHLORIDE 0.9% FLUSH
10.0000 mL | INTRAVENOUS | Status: DC | PRN
  Administered 2021-08-19: 10 mL

## 2021-08-19 MED ORDER — SODIUM CHLORIDE 0.9 % IV SOLN: Freq: Once | INTRAVENOUS | Status: AC

## 2021-08-19 NOTE — Progress Notes (Signed)
[place] [phone]   [phone]  OFFICE PROGRESS NOTE  [name], [place] [address]  DIAGNOSIS: Stage IV (T1c, N1, M1c ) non-small cell lung cancer, adenocarcinoma diagnosed in July 2020 and presented with left upper lobe lung nodule in addition to right upper lobe lung nodule with left hilar lymphadenopathy as well as multiple brain metastasis.  Biomarker Findings Microsatellite status - MS-Stable Tumor Mutational Burden - 4 Muts/Mb Genomic Findings For a complete list of the genes assayed, please refer to the Appendix. ATM G204* KRAS G12C PDGFRA P122T SMO R199W 7 Disease relevant genes with no reportable alterations: ALK, BRAF, EGFR, ERBB2, MET, RET, ROS1  PDL 1 expression was 1%  PRIOR THERAPY:  1) SRS to multiple brain metastasis under the care of Dr. Isidore Moos. 2) SRS to a 4 mm new brain metastasis under the care of Dr. Isidore Moos and Dr. Saintclair Halsted on 09/02/2020  CURRENT THERAPY: Systemic chemotherapy with carboplatin for AUC of 5, Alimta 500 mg/M2 and Keytruda 200 mg IV every 3 weeks.  First dose October 05, 2018.  Status post 50 cycles.  Starting from cycle #5 the patient will be on treatment with maintenance Alimta 500 mg/M2 and Keytruda 200 mg IV every 3 weeks.  She has been on treatment with single agent Keytruda recently secondary to renal insufficiency.  INTERVAL HISTORY: Carolyn [name] 66 y.o. female returns to the clinic today for follow-up visit.  The patient is feeling fine today with no concerning complaints.  She denied having any current chest pain, shortness of breath except with exertion with no cough or hemoptysis.  She has no nausea, vomiting, diarrhea or constipation.  She has no weight loss or night sweats.  She has no headache or visual changes.  The patient continues to tolerate her treatment with [facility] fairly well.  She had repeat CT scan of the chest, abdomen and pelvis  performed recently.  The patient is here today for evaluation and discussion of her scan results and treatment options.  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  MEDICATIONS:  Current Outpatient Medications  Medication Sig Dispense Refill   acetaminophen (TYLENOL) 500 MG tablet Take 1,000 mg by mouth every 8 (eight) hours as needed for mild pain, fever or headache.      amLODipine (NORVASC) 10 MG tablet Take 1 tablet by mouth once daily 90 tablet 0   aspirin EC 81 MG tablet Take 81 mg by mouth daily.     carvedilol (COREG) 25 MG tablet Take 1/2 (one-half) tablet by mouth  twice daily 180 tablet 0   Cholecalciferol (VITAMIN D-3) 125 MCG (5000 UT) TABS Take 5,000 Units by mouth daily.     empagliflozin (JARDIANCE) 25 MG TABS tablet Take 1 tablet (25 mg total) by mouth daily before breakfast. 90 tablet 3   esomeprazole (NEXIUM) 20 MG capsule Take 20 mg by mouth daily with breakfast.     ezetimibe (ZETIA) 10 MG tablet TAKE 1 TABLET BY MOUTH ONCE DAILY AFTER SUPPER 90 tablet 0   fexofenadine (ALLEGRA) 180 MG tablet Take 180 mg by mouth daily.     fluticasone (FLONASE) 50 MCG/ACT nasal spray Place 1 spray into both nostrils daily as needed for allergies or rhinitis.     Lancets (ONETOUCH DELICA PLUS [id number]) MISC SMARTSIG:1 Topical 3 Times  Daily     levocetirizine (XYZAL) 5 MG tablet Take 5 mg by mouth daily as needed for allergies.     lidocaine-prilocaine (EMLA) cream Apply to the Port-A-Cath site 30 to 60 minutes before chemotherapy. 30 g 2   metFORMIN (GLUCOPHAGE) 500 MG tablet Take 500 mg by mouth daily with supper.     methocarbamol (ROBAXIN) 500 MG tablet Take 1 tablet (500 mg total) by mouth every 8 (eight) hours as needed for muscle spasms. 30 tablet 0   montelukast (SINGULAIR) 10 MG tablet Take 10 mg by mouth daily as needed (allergies).     nitroGLYCERIN (NITROSTAT) 0.4 MG SL tablet Place 1 tablet (0.4 mg total) under the tongue every 5 (five) minutes as needed for chest pain. 25 tablet 2   ONETOUCH ULTRA test strip USE 1 STRIP TO CHECK GLUCOSE THREE TIMES DAILY     oxyCODONE (OXY IR/ROXICODONE) 5 MG immediate release tablet Take 1 tablet (5 mg total) by mouth every 6 (six) hours as needed for severe pain. (Patient not taking: Reported on 06/24/2021) 40 tablet 0   pentoxifylline (TRENTAL) 400 MG CR tablet Take 478m tab daily x 1 week, then 4040mBID; take with food (Patient taking differently: 400 mg 2 (two) times daily with breakfast and lunch.) 60 tablet 5   Pitavastatin Calcium (LIVALO) 4 MG TABS Take 1 tablet (4 mg total) by mouth at bedtime. 90 tablet 1   Polyethyl Glycol-Propyl Glycol (SYSTANE) 0.4-0.3 % GEL ophthalmic gel Place 1 application. into both eyes every 6 (six) hours as needed (dry eyes).     predniSONE (DELTASONE) 5 MG tablet See admin instructions.     telmisartan (MICARDIS) 40 MG tablet Take 40 mg by mouth daily.     vitamin E 180 MG (400 UNITS) capsule Take 400 IU daily x 1 week, then 400 IU BID (Patient taking differently: Take 400 Units by mouth in the morning and at bedtime.) 60 capsule 5   No current facility-administered medications for this visit.    SURGICAL HISTORY:  Past Surgical History:  Procedure Laterality Date   ABDOMINAL HYSTERECTOMY     bone spur     CARDIAC CATHETERIZATION   06/13/2012   carpel tunnel surgery Left    COLONOSCOPY     9 years ago   CORONARY ANGIOPLASTY  06/13/2012   EYE SURGERY     cyst left eye    IR IMAGING GUIDED PORT INSERTION  10/10/2018   LEFT HEART CATHETERIZATION WITH CORONARY ANGIOGRAM N/A 06/13/2012   Procedure: LEFT HEART CATHETERIZATION WITH CORONARY ANGIOGRAM;  Surgeon: JaLaverda PageMD;  [facility];  Service: Cardiovascular;  Laterality: N/A;   NECK SURGERY     rotator cuff surgery Right 2015  SHOULDER ARTHROSCOPY WITH ROTATOR CUFF REPAIR AND SUBACROMIAL DECOMPRESSION Left 05/14/2021   Procedure: LEFT SHOULDER ARTHROSCOPY, SUBACROMIAL DECOMPRESSION, BICEPS TENODESIS, MINI OPEN ROTATOR CUFF REPAIR;  Surgeon: Meredith Pel, MD;  Location: [facility];  Service: Orthopedics;  Laterality: Left;   VIDEO BRONCHOSCOPY WITH ENDOBRONCHIAL NAVIGATION N/A 09/06/2018   Procedure: VIDEO BRONCHOSCOPY WITH ENDOBRONCHIAL NAVIGATION, left upper lung;  Surgeon: Lajuana Matte, MD;  Location: [facility];  Service: Thoracic;  Laterality: N/A;   VIDEO BRONCHOSCOPY WITH ENDOBRONCHIAL ULTRASOUND Left 09/06/2018   Procedure: VIDEO BRONCHOSCOPY WITH ENDOBRONCHIAL ULTRASOUND, left lung;  Surgeon: Lajuana Matte, MD;  Location: [facility];  Service: Thoracic;  Laterality: Left;   WISDOM TOOTH EXTRACTION      REVIEW OF SYSTEMS:  Constitutional: positive for fatigue Eyes: negative Ears, nose, mouth, throat, and face: negative Respiratory: negative Cardiovascular: negative Gastrointestinal: negative Genitourinary:negative Integument/breast: negative Hematologic/lymphatic: negative Musculoskeletal:negative Neurological: negative Behavioral/Psych: negative Endocrine: negative Allergic/Immunologic: negative   PHYSICAL EXAMINATION: General appearance: alert, cooperative, fatigued, and no distress Head: Normocephalic, without obvious abnormality, atraumatic Neck: no adenopathy, no JVD, supple, symmetrical, trachea midline, and thyroid not  enlarged, symmetric, no tenderness/mass/nodules Lymph nodes: Cervical, supraclavicular, and axillary nodes normal. Resp: clear to auscultation bilaterally Back: symmetric, no curvature. ROM normal. No CVA tenderness. Cardio: regular rate and rhythm, S1, S2 normal, no murmur, click, rub or gallop GI: soft, non-tender; bowel sounds normal; no masses,  no organomegaly Extremities: extremities normal, atraumatic, no cyanosis or edema Neurologic: Alert and oriented X 3, normal strength and tone. Normal symmetric reflexes. Normal coordination and gait  ECOG PERFORMANCE STATUS: 1 - Symptomatic but completely ambulatory  Blood pressure 122/63, pulse 65, temperature 98.1 F (36.7 C), temperature source Oral, resp. rate 18, height _0  (1.549 m), weight 177 lb 6.4 oz (80.5 kg), SpO2 100 %.  LABORATORY DATA: Lab Results  Component Value Date   WBC 6.4 08/19/2021   HGB 12.2 08/19/2021   HCT 38.8 08/19/2021   MCV 79.7 (L) 08/19/2021   PLT 266 08/19/2021      Chemistry      Component Value Date/Time   NA 139 08/19/2021 0949   NA 139 08/18/2020 0801   K 3.6 08/19/2021 0949   CL 103 08/19/2021 0949   CO2 27 08/19/2021 0949   BUN 20 08/19/2021 0949   BUN 16 08/18/2020 0801   CREATININE 1.86 (H) 08/19/2021 0949      Component Value Date/Time   CALCIUM 9.4 08/19/2021 0949   ALKPHOS 60 08/19/2021 0949   AST 21 08/19/2021 0949   ALT 14 08/19/2021 0949   BILITOT 0.5 08/19/2021 0949       RADIOGRAPHIC STUDIES: CT Chest Wo Contrast  Result Date: 08/14/2021 CLINICAL DATA:  Non-small-cell lung cancer brain metastases. Restaging. * Tracking Code: BO * EXAM: CT CHEST, ABDOMEN AND PELVIS WITHOUT CONTRAST TECHNIQUE: Multidetector CT imaging of the chest, abdomen and pelvis was performed following the standard protocol without IV contrast. RADIATION DOSE REDUCTION: This exam was performed according to the departmental dose-optimization program which includes automated exposure control, adjustment  of the mA and/or kV according to patient size and/or use of iterative reconstruction technique. COMPARISON:  05/25/2021 FINDINGS: CT CHEST FINDINGS Cardiovascular: The heart size is normal. No substantial pericardial effusion. Coronary artery calcification is evident. Moderate atherosclerotic calcification is noted in the wall of the thoracic aorta. Right Port-A-Cath tip is positioned at the SVC/RA junction. Mediastinum/Nodes: No mediastinal lymphadenopathy. No evidence for gross hilar lymphadenopathy although assessment is limited by the lack of intravenous contrast  on the current study. The esophagus has normal imaging features. There is no axillary lymphadenopathy. Lungs/Pleura: 7 mm spiculated left upper lobe pulmonary nodule on 43/4 is unchanged in the interval. 3 mm ground-glass nodule measured previously is stable in the peripheral right lower lobe on 60/4 today. 4 mm ground-glass nodule left lower lobe on 55/4 is stable. 5 mm ground-glass nodule identified previously in the right middle lobe is 4 mm today on 68/4. I no new suspicious pulmonary nodule or mass. No focal airspace consolidation. No pleural effusion. Musculoskeletal: No worrisome lytic or sclerotic osseous abnormality. CT ABDOMEN PELVIS FINDINGS Hepatobiliary: No suspicious focal abnormality in the liver on this study without intravenous contrast. There is no evidence for gallstones, gallbladder wall thickening, or pericholecystic fluid. No intrahepatic or extrahepatic biliary dilation. Pancreas: No focal mass lesion. No dilatation of the main duct. No intraparenchymal cyst. No peripancreatic edema. Spleen: No splenomegaly. No focal mass lesion. Adrenals/Urinary Tract: No adrenal nodule or mass. Kidneys unremarkable. No evidence for hydroureter. The urinary bladder appears normal for the degree of distention. Stomach/Bowel: Stomach is unremarkable. No gastric wall thickening. No evidence of outlet obstruction. Duodenum is normally positioned as  is the ligament of Treitz. Duodenal diverticulum noted. No small bowel wall thickening. No small bowel dilatation. The terminal ileum is normal. The appendix is normal. No gross colonic mass. No colonic wall thickening. Vascular/Lymphatic: There is moderate atherosclerotic calcification of the abdominal aorta without aneurysm. There is no gastrohepatic or hepatoduodenal ligament lymphadenopathy. No retroperitoneal or mesenteric lymphadenopathy. No pelvic sidewall lymphadenopathy. Reproductive: Unremarkable. Other: No intraperitoneal free fluid. Musculoskeletal: No worrisome lytic or sclerotic osseous abnormality. IMPRESSION: 1. Stable exam. No new or progressive findings in the chest, abdomen, or pelvis. 2. 7 mm spiculated left upper lobe pulmonary nodule is unchanged in the interval. Other scattered tiny bilateral pulmonary nodules are also stable. 3. Aortic Atherosclerosis (ICD10-I70.0). Electronically Signed   By: Misty Stanley M.D.   On: 08/14/2021 13:42   CT Abdomen Pelvis Wo Contrast  Result Date: 08/14/2021 CLINICAL DATA:  Non-small-cell lung cancer brain metastases. Restaging. * Tracking Code: BO * EXAM: CT CHEST, ABDOMEN AND PELVIS WITHOUT CONTRAST TECHNIQUE: Multidetector CT imaging of the chest, abdomen and pelvis was performed following the standard protocol without IV contrast. RADIATION DOSE REDUCTION: This exam was performed according to the departmental dose-optimization program which includes automated exposure control, adjustment of the mA and/or kV according to patient size and/or use of iterative reconstruction technique. COMPARISON:  05/25/2021 FINDINGS: CT CHEST FINDINGS Cardiovascular: The heart size is normal. No substantial pericardial effusion. Coronary artery calcification is evident. Moderate atherosclerotic calcification is noted in the wall of the thoracic aorta. Right Port-A-Cath tip is positioned at the SVC/RA junction. Mediastinum/Nodes: No mediastinal lymphadenopathy. No evidence  for gross hilar lymphadenopathy although assessment is limited by the lack of intravenous contrast on the current study. The esophagus has normal imaging features. There is no axillary lymphadenopathy. Lungs/Pleura: 7 mm spiculated left upper lobe pulmonary nodule on 43/4 is unchanged in the interval. 3 mm ground-glass nodule measured previously is stable in the peripheral right lower lobe on 60/4 today. 4 mm ground-glass nodule left lower lobe on 55/4 is stable. 5 mm ground-glass nodule identified previously in the right middle lobe is 4 mm today on 68/4. I no new suspicious pulmonary nodule or mass. No focal airspace consolidation. No pleural effusion. Musculoskeletal: No worrisome lytic or sclerotic osseous abnormality. CT ABDOMEN PELVIS FINDINGS Hepatobiliary: No suspicious focal abnormality in the liver on this study without  intravenous contrast. There is no evidence for gallstones, gallbladder wall thickening, or pericholecystic fluid. No intrahepatic or extrahepatic biliary dilation. Pancreas: No focal mass lesion. No dilatation of the main duct. No intraparenchymal cyst. No peripancreatic edema. Spleen: No splenomegaly. No focal mass lesion. Adrenals/Urinary Tract: No adrenal nodule or mass. Kidneys unremarkable. No evidence for hydroureter. The urinary bladder appears normal for the degree of distention. Stomach/Bowel: Stomach is unremarkable. No gastric wall thickening. No evidence of outlet obstruction. Duodenum is normally positioned as is the ligament of Treitz. Duodenal diverticulum noted. No small bowel wall thickening. No small bowel dilatation. The terminal ileum is normal. The appendix is normal. No gross colonic mass. No colonic wall thickening. Vascular/Lymphatic: There is moderate atherosclerotic calcification of the abdominal aorta without aneurysm. There is no gastrohepatic or hepatoduodenal ligament lymphadenopathy. No retroperitoneal or mesenteric lymphadenopathy. No pelvic sidewall  lymphadenopathy. Reproductive: Unremarkable. Other: No intraperitoneal free fluid. Musculoskeletal: No worrisome lytic or sclerotic osseous abnormality. IMPRESSION: 1. Stable exam. No new or progressive findings in the chest, abdomen, or pelvis. 2. 7 mm spiculated left upper lobe pulmonary nodule is unchanged in the interval. Other scattered tiny bilateral pulmonary nodules are also stable. 3. Aortic Atherosclerosis (ICD10-I70.0). Electronically Signed   By: Misty Stanley M.D.   On: 08/14/2021 13:42     ASSESSMENT AND PLAN: This is a very pleasant 66 years old African-American female with likely stage IV (T1c, N1, M1C) non-small cell lung cancer, adenocarcinoma with positive KRAS G12C mutation diagnosed in July 2020 and presented with left upper lobe lung nodule in addition to right upper lobe pulmonary nodule as well as left hilar adenopathy and metastatic lesion to the brain. Molecular studies showed no actionable mutation and PDL 1 expression was 1%. The patient underwent SBRT to the metastatic brain lesion under the care of Dr. Isidore Moos. The patient is currently undergoing systemic chemotherapy with carboplatin for AUC of 5, Alimta 500 mg/M2 and Keytruda 200 mg IV every 3 weeks is status post 50 cycles.  Starting from cycle #5 she is on maintenance treatment with Alimta and Keytruda every 3 weeks with only Keytruda on the last few cycles because of the renal insufficiency. The patient has been tolerating her maintenance treatment with Keytruda fairly well with no concerning adverse effects. She had repeat CT scan of the chest, abdomen pelvis performed recently.  I personally and independently reviewed the scans and discussed the results with the patient today. Her scan showed no concerning findings for disease progression. I recommended for the patient to continue her current treatment with [facility] and she will proceed with cycle #51 today. For the renal insufficiency, she is followed by  nephrology. She will come back for follow-up visit in 3 weeks for evaluation before the next cycle of her treatment. The patient was advised to call immediately if she has any other concerning symptoms in the interval. The patient voices understanding of current disease status and treatment options and is in agreement with the current care plan. All questions were answered. The patient knows to call the clinic with any problems, questions or concerns. We can certainly see the patient much sooner if necessary.  Disclaimer: This note was dictated with voice recognition software. Similar sounding words can inadvertently be transcribed and may not be corrected upon review.

## 2021-08-19 NOTE — Progress Notes (Signed)
Per Dr. Julien Nordmann , it is ok to treat pt today with  Pembrolizumab and creatinine of 1.86.

## 2021-08-19 NOTE — Patient Instructions (Signed)
[facility] ONCOLOG[facility]  Discharge Instructions: Thank you for choosing [facility] to provide your oncology and hematology care.   If you have a lab appointment with the [facility], please go directly to the [facility] and check in at the registration area.   Wear comfortable clothing and clothing appropriate for easy access to any Portacath or PICC line.   We strive to give you quality time with your provider. You may need to reschedule your appointment if you arrive late (15 or more minutes).  Arriving late affects you and other patients whose appointments are after yours.  Also, if you miss three or more appointments without notifying the office, you may be dismissed from the clinic at the provider's discretion.      For prescription refill requests, have your pharmacy contact our office and allow 72 hours for refills to be completed.    Today you received the following chemotherapy and/or immunotherapy agents: Beryle Flock      To help prevent nausea and vomiting after your treatment, we encourage you to take your nausea medication as directed.  BELOW ARE SYMPTOMS THAT SHOULD BE REPORTED IMMEDIATELY: *FEVER GREATER THAN 100.4 F (38 C) OR HIGHER *CHILLS OR SWEATING *NAUSEA AND VOMITING THAT IS NOT CONTROLLED WITH YOUR NAUSEA MEDICATION *UNUSUAL SHORTNESS OF BREATH *UNUSUAL BRUISING OR BLEEDING *URINARY PROBLEMS (pain or burning when urinating, or frequent urination) *BOWEL PROBLEMS (unusual diarrhea, constipation, pain near the anus) TENDERNESS IN MOUTH AND THROAT WITH OR WITHOUT PRESENCE OF ULCERS (sore throat, sores in mouth, or a toothache) UNUSUAL RASH, SWELLING OR PAIN  UNUSUAL VAGINAL DISCHARGE OR ITCHING   Items with * indicate a potential emergency and should be followed up as soon as possible or go to the Emergency Department if any problems should occur.  Please show the CHEMOTHERAPY ALERT CARD or IMMUNOTHERAPY ALERT CARD at check-in to  the Emergency Department and triage nurse.  Should you have questions after your visit or need to cancel or reschedule your appointment, please contact [facility]  Dept: [phone]  and follow the prompts.  Office hours are 8:00 a.m. to 4:30 p.m. Monday - Friday. Please note that voicemails left after 4:00 p.m. may not be returned until the following business day.  We are closed weekends and major holidays. You have access to a nurse at all times for urgent questions. Please call the main number to the clinic Dept: [phone] and follow the prompts.   For any non-urgent questions, you may also contact your provider using MyChart. We now offer e-Visits for anyone 68 and older to request care online for non-urgent symptoms. For details visit [email or web address].   Also download the MyChart app! Go to the app store, search "MyChart", open the app, select [facility], and log in with your MyChart username and password.  Masks are optional in the cancer centers. If you would like for your care team to wear a mask while they are taking care of you, please let them know. For doctor visits, patients may have with them one support person who is at least 68 years old. At this time, visitors are not allowed in the infusion area.

## 2021-08-20 ENCOUNTER — Other Ambulatory Visit: Payer: Self-pay | Admitting: Radiation Therapy

## 2021-08-20 DIAGNOSIS — C7931 Secondary malignant neoplasm of brain: Secondary | ICD-10-CM

## 2021-08-27 ENCOUNTER — Other Ambulatory Visit: Payer: Self-pay

## 2021-08-27 ENCOUNTER — Telehealth: Payer: Self-pay | Admitting: Internal Medicine

## 2021-08-27 DIAGNOSIS — E1122 Type 2 diabetes mellitus with diabetic chronic kidney disease: Secondary | ICD-10-CM

## 2021-08-27 MED ORDER — EMPAGLIFLOZIN 25 MG PO TABS: 25.0000 mg | ORAL_TABLET | Freq: Every day | ORAL | 3 refills | Status: DC

## 2021-08-27 NOTE — Telephone Encounter (Signed)
Called patient regarding upcoming August and September appointments. Patient has been called and notified, informed patient to check on My chart as well.

## 2021-08-31 ENCOUNTER — Other Ambulatory Visit: Payer: Self-pay

## 2021-09-01 ENCOUNTER — Other Ambulatory Visit: Payer: Self-pay

## 2021-09-02 ENCOUNTER — Other Ambulatory Visit: Payer: Self-pay

## 2021-09-02 DIAGNOSIS — N611 Abscess of the breast and nipple: Secondary | ICD-10-CM | POA: Diagnosis not present

## 2021-09-03 NOTE — Progress Notes (Deleted)
[facility] OFFICE PROGRESS NOTE  [name], [place] [address]  DIAGNOSIS: Stage IV (T1c, N1, M1c ) non-small cell lung cancer, adenocarcinoma diagnosed in July 2020 and presented with left upper lobe lung nodule in addition to right upper lobe lung nodule with left hilar lymphadenopathy as well as multiple brain metastasis.   Biomarker Findings Microsatellite status - MS-Stable Tumor Mutational Burden - 4 Muts/Mb Genomic Findings For a complete list of the genes assayed, please refer to the Appendix. ATM G204* KRAS G12C PDGFRA P122T SMO R199W 7 Disease relevant genes with no reportable alterations: ALK, BRAF, EGFR, ERBB2, MET, RET, ROS1   PDL 1 expression was 1%  PRIOR THERAPY: 1) SBRT to multiple brain metastasis under the care of Dr. Isidore Moos. Completed in August 2020 2) SRS to the new metastatic brain lesions under the care of Dr. Saintclair Halsted and Dr. Isidore Moos on 09/02/20 and 12/16/20  CURRENT THERAPY: Systemic chemotherapy with carboplatin for AUC of 5, Alimta 500 mg/M2 and Keytruda 200 mg IV every 3 weeks.  First dose October 05, 2018.  Status post 51 cycles.  Starting from cycle #5 the patient will be on treatment with maintenance Alimta 500 mg/M2 and Keytruda 200 mg IV every 3 weeks.  She has been on treatment with single agent Keytruda secondary to renal insufficiency  INTERVAL HISTORY: Carolyn [name] 66 y.o. female returns to the clinic today for a follow-up visit. The patient is feeling fairly well today without any concerning complaints except ***. Denies any recent fever, chills, or weight loss. She used to get night sweats regularly but states this has not happened in a long time. She denies any chest pain, shortness of breath, cough, or hemoptysis.  Denies any nausea, vomiting, or diarrhea. She does have occasional constipation for which she takes an OTC laxative. Denies any rashes or skin changes except sometimes she  reports small areas of bruising.  She denies any recent headache or visual changes.  She follows closely with radiation oncology regarding her history of metastatic disease to the brain. She is followed closely by nephrology due to her renal insufficiency.  Her chemotherapy with Alimta was discontinued due to renal insufficiency.  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  MEDICATIONS:  Current Outpatient Medications  Medication Sig Dispense Refill   acetaminophen (TYLENOL) 500 MG tablet Take 1,000 mg by mouth every 8 (eight) hours as needed for mild pain, fever or headache.  amLODipine (NORVASC) 10 MG tablet Take 1 tablet by mouth once daily 90 tablet 0   aspirin EC 81 MG tablet Take 81 mg by mouth daily.     carvedilol (COREG) 25 MG tablet Take 1/2 (one-half) tablet by mouth twice daily 180 tablet 0   Cholecalciferol (VITAMIN D-3) 125 MCG (5000 UT) TABS Take 5,000 Units by mouth daily.     empagliflozin (JARDIANCE) 25 MG TABS tablet Take 1 tablet (25 mg total) by mouth daily before breakfast. 90 tablet 3   esomeprazole (NEXIUM) 20 MG capsule Take 20 mg by mouth daily with breakfast.     ezetimibe (ZETIA) 10 MG tablet TAKE 1 TABLET BY MOUTH ONCE DAILY  AFTER SUPPER 90 tablet 0   fexofenadine (ALLEGRA) 180 MG tablet Take 180 mg by mouth daily.     fluticasone (FLONASE) 50 MCG/ACT nasal spray Place 1 spray into both nostrils daily as needed for allergies or rhinitis.     hydroxychloroquine (PLAQUENIL) 200 MG tablet Take 200 mg by mouth daily.     Lancets (ONETOUCH DELICA PLUS [id number]) MISC SMARTSIG:1 Topical 3 Times Daily     levocetirizine (XYZAL) 5 MG tablet Take 5 mg by mouth daily as needed for allergies.     lidocaine-prilocaine (EMLA) cream Apply to the Port-A-Cath site 30 to 60 minutes before chemotherapy. 30 g 2   metFORMIN (GLUCOPHAGE) 500 MG tablet Take 500 mg by mouth daily with supper.     methocarbamol (ROBAXIN) 500 MG tablet Take 1 tablet (500 mg total) by mouth every 8 (eight) hours as needed for muscle spasms. 30 tablet 0   montelukast (SINGULAIR) 10 MG tablet Take 10 mg by mouth daily as needed (allergies).     nitroGLYCERIN (NITROSTAT) 0.4 MG SL tablet Place 1 tablet (0.4 mg total) under the tongue every 5 (five) minutes as needed for chest pain. 25 tablet 2   ONETOUCH ULTRA test strip USE 1 STRIP TO CHECK GLUCOSE THREE TIMES DAILY     oxyCODONE (OXY IR/ROXICODONE) 5 MG immediate release tablet Take 1 tablet (5 mg total) by mouth every 6 (six) hours as needed for severe pain. (Patient not taking: Reported on 06/24/2021) 40 tablet 0   pentoxifylline (TRENTAL) 400 MG CR tablet Take $RemoveBef'400mg'TgmmOJdrna$  tab daily x 1 week, then $RemoveBe'400mg'VUifmqxFo$  BID; take with food (Patient taking differently: 400 mg 2 (two) times daily with breakfast and lunch.) 60 tablet 5   Pitavastatin Calcium (LIVALO) 4 MG TABS Take 1 tablet (4 mg total) by mouth at bedtime. 90 tablet 1   Polyethyl Glycol-Propyl Glycol (SYSTANE) 0.4-0.3 % GEL ophthalmic gel Place 1 application. into both eyes every 6 (six) hours as needed (dry eyes).     predniSONE (DELTASONE) 5 MG tablet See admin instructions.     telmisartan (MICARDIS) 40 MG tablet Take 40 mg by mouth daily.     vitamin E 180 MG (400  UNITS) capsule Take 400 IU daily x 1 week, then 400 IU BID (Patient taking differently: Take 400 Units by mouth in the morning and at bedtime.) 60 capsule 5   No current facility-administered medications for this visit.    SURGICAL HISTORY:  Past Surgical History:  Procedure Laterality Date   ABDOMINAL HYSTERECTOMY     bone spur     CARDIAC CATHETERIZATION  06/13/2012   carpel tunnel surgery Left    COLONOSCOPY     9 years ago   CORONARY ANGIOPLASTY  06/13/2012   EYE SURGERY     cyst left eye  IR IMAGING GUIDED PORT INSERTION  10/10/2018   LEFT HEART CATHETERIZATION WITH CORONARY ANGIOGRAM N/A 06/13/2012   Procedure: LEFT HEART CATHETERIZATION WITH CORONARY ANGIOGRAM;  Surgeon: Laverda Page, MD;  Location: [facility] CATH LAB;  Service: Cardiovascular;  Laterality: N/A;   NECK SURGERY     rotator cuff surgery Right 2015   SHOULDER ARTHROSCOPY WITH ROTATOR CUFF REPAIR AND SUBACROMIAL DECOMPRESSION Left 05/14/2021   Procedure: LEFT SHOULDER ARTHROSCOPY, SUBACROMIAL DECOMPRESSION, BICEPS TENODESIS, MINI OPEN ROTATOR CUFF REPAIR;  Surgeon: Meredith Pel, MD;  Location: [facility];  Service: Orthopedics;  Laterality: Left;   VIDEO BRONCHOSCOPY WITH ENDOBRONCHIAL NAVIGATION N/A 09/06/2018   Procedure: VIDEO BRONCHOSCOPY WITH ENDOBRONCHIAL NAVIGATION, left upper lung;  Surgeon: Lajuana Matte, MD;  Location: [facility];  Service: Thoracic;  Laterality: N/A;   VIDEO BRONCHOSCOPY WITH ENDOBRONCHIAL ULTRASOUND Left 09/06/2018   Procedure: VIDEO BRONCHOSCOPY WITH ENDOBRONCHIAL ULTRASOUND, left lung;  Surgeon: Lajuana Matte, MD;  Location: [facility];  Service: Thoracic;  Laterality: Left;   WISDOM TOOTH EXTRACTION      REVIEW OF SYSTEMS:   Review of Systems  Constitutional: Negative for appetite change, chills, fatigue, fever and unexpected weight change.  HENT:   Negative for mouth sores, nosebleeds, sore throat and trouble swallowing.   Eyes: Negative for eye problems and icterus.  Respiratory:  Negative for cough, hemoptysis, shortness of breath and wheezing.   Cardiovascular: Negative for chest pain and leg swelling.  Gastrointestinal: Negative for abdominal pain, constipation, diarrhea, nausea and vomiting.  Genitourinary: Negative for bladder incontinence, difficulty urinating, dysuria, frequency and hematuria.   Musculoskeletal: Negative for back pain, gait problem, neck pain and neck stiffness.  Skin: Negative for itching and rash.  Neurological: Negative for dizziness, extremity weakness, gait problem, headaches, light-headedness and seizures.  Hematological: Negative for adenopathy. Does not bruise/bleed easily.  Psychiatric/Behavioral: Negative for confusion, depression and sleep disturbance. The patient is not nervous/anxious.     PHYSICAL EXAMINATION:  There were no vitals taken for this visit.  ECOG PERFORMANCE STATUS: {CHL ONC ECOG [id number]  Physical Exam  Constitutional: Oriented to person, place, and time and well-developed, well-nourished, and in no distress. No distress.  HENT:  Head: Normocephalic and atraumatic.  Mouth/Throat: Oropharynx is clear and moist. No oropharyngeal exudate.  Eyes: Conjunctivae are normal. Right eye exhibits no discharge. Left eye exhibits no discharge. No scleral icterus.  Neck: Normal range of motion. Neck supple.  Cardiovascular: Normal rate, regular rhythm, normal heart sounds and intact distal pulses.   Pulmonary/Chest: Effort normal and breath sounds normal. No respiratory distress. No wheezes. No rales.  Abdominal: Soft. Bowel sounds are normal. Exhibits no distension and no mass. There is no tenderness.  Musculoskeletal: Normal range of motion. Exhibits no edema.  Lymphadenopathy:    No cervical adenopathy.  Neurological: Alert and oriented to person, place, and time. Exhibits normal muscle tone. Gait normal. Coordination normal.  Skin: Skin is warm and dry. No rash noted. Not diaphoretic. No erythema. No pallor.   Psychiatric: Mood, memory and judgment normal.  Vitals reviewed.  LABORATORY DATA: Lab Results  Component Value Date   WBC 6.4 08/19/2021   HGB 12.2 08/19/2021   HCT 38.8 08/19/2021   MCV 79.7 (L) 08/19/2021   PLT 266 08/19/2021      Chemistry      Component Value Date/Time   NA 139 08/19/2021 0949   NA 139 08/18/2020 0801   K 3.6 08/19/2021 0949   CL 103 08/19/2021 0949   CO2 27 08/19/2021 0949  BUN 20 08/19/2021 0949   BUN 16 08/18/2020 0801   CREATININE 1.86 (H) 08/19/2021 0949      Component Value Date/Time   CALCIUM 9.4 08/19/2021 0949   ALKPHOS 60 08/19/2021 0949   AST 21 08/19/2021 0949   ALT 14 08/19/2021 0949   BILITOT 0.5 08/19/2021 0949       RADIOGRAPHIC STUDIES:  CT Chest Wo Contrast  Result Date: 08/14/2021 CLINICAL DATA:  Non-small-cell lung cancer brain metastases. Restaging. * Tracking Code: BO * EXAM: CT CHEST, ABDOMEN AND PELVIS WITHOUT CONTRAST TECHNIQUE: Multidetector CT imaging of the chest, abdomen and pelvis was performed following the standard protocol without IV contrast. RADIATION DOSE REDUCTION: This exam was performed according to the departmental dose-optimization program which includes automated exposure control, adjustment of the mA and/or kV according to patient size and/or use of iterative reconstruction technique. COMPARISON:  05/25/2021 FINDINGS: CT CHEST FINDINGS Cardiovascular: The heart size is normal. No substantial pericardial effusion. Coronary artery calcification is evident. Moderate atherosclerotic calcification is noted in the wall of the thoracic aorta. Right Port-A-Cath tip is positioned at the SVC/RA junction. Mediastinum/Nodes: No mediastinal lymphadenopathy. No evidence for gross hilar lymphadenopathy although assessment is limited by the lack of intravenous contrast on the current study. The esophagus has normal imaging features. There is no axillary lymphadenopathy. Lungs/Pleura: 7 mm spiculated left upper lobe pulmonary  nodule on 43/4 is unchanged in the interval. 3 mm ground-glass nodule measured previously is stable in the peripheral right lower lobe on 60/4 today. 4 mm ground-glass nodule left lower lobe on 55/4 is stable. 5 mm ground-glass nodule identified previously in the right middle lobe is 4 mm today on 68/4. I no new suspicious pulmonary nodule or mass. No focal airspace consolidation. No pleural effusion. Musculoskeletal: No worrisome lytic or sclerotic osseous abnormality. CT ABDOMEN PELVIS FINDINGS Hepatobiliary: No suspicious focal abnormality in the liver on this study without intravenous contrast. There is no evidence for gallstones, gallbladder wall thickening, or pericholecystic fluid. No intrahepatic or extrahepatic biliary dilation. Pancreas: No focal mass lesion. No dilatation of the main duct. No intraparenchymal cyst. No peripancreatic edema. Spleen: No splenomegaly. No focal mass lesion. Adrenals/Urinary Tract: No adrenal nodule or mass. Kidneys unremarkable. No evidence for hydroureter. The urinary bladder appears normal for the degree of distention. Stomach/Bowel: Stomach is unremarkable. No gastric wall thickening. No evidence of outlet obstruction. Duodenum is normally positioned as is the ligament of Treitz. Duodenal diverticulum noted. No small bowel wall thickening. No small bowel dilatation. The terminal ileum is normal. The appendix is normal. No gross colonic mass. No colonic wall thickening. Vascular/Lymphatic: There is moderate atherosclerotic calcification of the abdominal aorta without aneurysm. There is no gastrohepatic or hepatoduodenal ligament lymphadenopathy. No retroperitoneal or mesenteric lymphadenopathy. No pelvic sidewall lymphadenopathy. Reproductive: Unremarkable. Other: No intraperitoneal free fluid. Musculoskeletal: No worrisome lytic or sclerotic osseous abnormality. IMPRESSION: 1. Stable exam. No new or progressive findings in the chest, abdomen, or pelvis. 2. 7 mm spiculated  left upper lobe pulmonary nodule is unchanged in the interval. Other scattered tiny bilateral pulmonary nodules are also stable. 3. Aortic Atherosclerosis (ICD10-I70.0). Electronically Signed   By: Misty Stanley M.D.   On: 08/14/2021 13:42   CT Abdomen Pelvis Wo Contrast  Result Date: 08/14/2021 CLINICAL DATA:  Non-small-cell lung cancer brain metastases. Restaging. * Tracking Code: BO * EXAM: CT CHEST, ABDOMEN AND PELVIS WITHOUT CONTRAST TECHNIQUE: Multidetector CT imaging of the chest, abdomen and pelvis was performed following the standard protocol without IV contrast. RADIATION DOSE  REDUCTION: This exam was performed according to the departmental dose-optimization program which includes automated exposure control, adjustment of the mA and/or kV according to patient size and/or use of iterative reconstruction technique. COMPARISON:  05/25/2021 FINDINGS: CT CHEST FINDINGS Cardiovascular: The heart size is normal. No substantial pericardial effusion. Coronary artery calcification is evident. Moderate atherosclerotic calcification is noted in the wall of the thoracic aorta. Right Port-A-Cath tip is positioned at the SVC/RA junction. Mediastinum/Nodes: No mediastinal lymphadenopathy. No evidence for gross hilar lymphadenopathy although assessment is limited by the lack of intravenous contrast on the current study. The esophagus has normal imaging features. There is no axillary lymphadenopathy. Lungs/Pleura: 7 mm spiculated left upper lobe pulmonary nodule on 43/4 is unchanged in the interval. 3 mm ground-glass nodule measured previously is stable in the peripheral right lower lobe on 60/4 today. 4 mm ground-glass nodule left lower lobe on 55/4 is stable. 5 mm ground-glass nodule identified previously in the right middle lobe is 4 mm today on 68/4. I no new suspicious pulmonary nodule or mass. No focal airspace consolidation. No pleural effusion. Musculoskeletal: No worrisome lytic or sclerotic osseous abnormality.  CT ABDOMEN PELVIS FINDINGS Hepatobiliary: No suspicious focal abnormality in the liver on this study without intravenous contrast. There is no evidence for gallstones, gallbladder wall thickening, or pericholecystic fluid. No intrahepatic or extrahepatic biliary dilation. Pancreas: No focal mass lesion. No dilatation of the main duct. No intraparenchymal cyst. No peripancreatic edema. Spleen: No splenomegaly. No focal mass lesion. Adrenals/Urinary Tract: No adrenal nodule or mass. Kidneys unremarkable. No evidence for hydroureter. The urinary bladder appears normal for the degree of distention. Stomach/Bowel: Stomach is unremarkable. No gastric wall thickening. No evidence of outlet obstruction. Duodenum is normally positioned as is the ligament of Treitz. Duodenal diverticulum noted. No small bowel wall thickening. No small bowel dilatation. The terminal ileum is normal. The appendix is normal. No gross colonic mass. No colonic wall thickening. Vascular/Lymphatic: There is moderate atherosclerotic calcification of the abdominal aorta without aneurysm. There is no gastrohepatic or hepatoduodenal ligament lymphadenopathy. No retroperitoneal or mesenteric lymphadenopathy. No pelvic sidewall lymphadenopathy. Reproductive: Unremarkable. Other: No intraperitoneal free fluid. Musculoskeletal: No worrisome lytic or sclerotic osseous abnormality. IMPRESSION: 1. Stable exam. No new or progressive findings in the chest, abdomen, or pelvis. 2. 7 mm spiculated left upper lobe pulmonary nodule is unchanged in the interval. Other scattered tiny bilateral pulmonary nodules are also stable. 3. Aortic Atherosclerosis (ICD10-I70.0). Electronically Signed   By: Misty Stanley M.D.   On: 08/14/2021 13:42     ASSESSMENT/PLAN:  This is a very pleasant 66 year old African-American female with stage IV (T1c, N1, M1c) non-small cell lung cancer, adenocarcinoma. She presented with a dominant left upper lobe lung nodule in addition to a  right upper lobe pulmonary nodule with left hilar lymphadenopathy. She also has metastatic disease to the brain. She has no actionable mutations and her PDL1 expression is 1%. She was diagnosed in July 2020. She also has KRASG12C which is a targetable mutation that can be used in the second line setting.    The patient completed SBRT to the metastatic brain lesion under the care of Dr. Isidore Moos in August 2020. She also had SRS again on 09/02/20 and 12/16/20.    The patient is currently undergoing systemic chemotherapy with carboplatin for an AUC of 5, Alimta 500 mg/m, and Keytruda 200 mg IV every 3 weeks.  She is status post 51 cycles of treatment.  Starting from cycle #5, she has been on maintenance Alimta  500 mg/m2 and Keytruda 200 mg IV. She tolerated her treatment well without any concerning adverse side effects. Alimta was discontinued due to renal insufficiency.   Labs were reviewed. Her creatinine is *** today. She has baseline CKD and follows closely with nephrology. Recommend that she proceed with cycle #52 today.    We will see her back for a follow up visit in 3 weeks for evaluation before starting cycle #53    The patient was advised to call immediately if she has any concerning symptoms in the interval. The patient voices understanding of current disease status and treatment options and is in agreement with the current care plan. All questions were answered. The patient knows to call the clinic with any problems, questions or concerns. We can certainly see the patient much sooner if necessary        No orders of the defined types were placed in this encounter.    I spent {CHL ONC TIME VISIT - TKPTW:[id number]} counseling the patient face to face. The total time spent in the appointment was {CHL ONC TIME VISIT - GYFVC:[id number]}.  Jemarcus Dougal L Isola Mehlman, PA-C 09/03/21

## 2021-09-09 ENCOUNTER — Inpatient Hospital Stay: Payer: Medicare Other

## 2021-09-09 ENCOUNTER — Inpatient Hospital Stay: Payer: Medicare Other | Admitting: Physician Assistant

## 2021-09-09 ENCOUNTER — Telehealth: Payer: Self-pay

## 2021-09-09 NOTE — Telephone Encounter (Signed)
Pt called and stated she has tested positive for Covid. Appointments have been canceled for today. Per Cassie Heilingoetter PA, pt to keep her follow up appointments in 3 weeks. Pt verbalized understanding.

## 2021-09-09 NOTE — Telephone Encounter (Signed)
Spoke to pt. Gave reminder of MRI 8/11 and notification of f/u with Dr. Saintclair Halsted 8/15 @ 9:00am. Pt aware and has no questions

## 2021-09-18 ENCOUNTER — Ambulatory Visit
Admission: RE | Admit: 2021-09-18 | Discharge: 2021-09-18 | Disposition: A | Discharge status: Home / Self Care | Payer: Medicare Other | Source: Ambulatory Visit | Attending: Radiation Oncology | Admitting: Radiation Oncology

## 2021-09-18 DIAGNOSIS — C7931 Secondary malignant neoplasm of brain: Secondary | ICD-10-CM

## 2021-09-18 DIAGNOSIS — C3490 Malignant neoplasm of unspecified part of unspecified bronchus or lung: Secondary | ICD-10-CM | POA: Diagnosis not present

## 2021-09-18 DIAGNOSIS — G9389 Other specified disorders of brain: Secondary | ICD-10-CM | POA: Diagnosis not present

## 2021-09-18 MED ORDER — GADOBENATE DIMEGLUMINE 529 MG/ML IV SOLN
17.0000 mL | Freq: Once | INTRAVENOUS | Status: AC | PRN
Start: 2021-09-18 — End: 2021-09-18
  Administered 2021-09-18: 17 mL via INTRAVENOUS

## 2021-09-21 ENCOUNTER — Inpatient Hospital Stay: Payer: Medicare Other | Attending: Physician Assistant

## 2021-09-21 ENCOUNTER — Other Ambulatory Visit: Payer: Self-pay

## 2021-09-21 DIAGNOSIS — Z5112 Encounter for antineoplastic immunotherapy: Secondary | ICD-10-CM | POA: Insufficient documentation

## 2021-09-21 DIAGNOSIS — Z79899 Other long term (current) drug therapy: Secondary | ICD-10-CM | POA: Insufficient documentation

## 2021-09-21 DIAGNOSIS — C3412 Malignant neoplasm of upper lobe, left bronchus or lung: Secondary | ICD-10-CM | POA: Insufficient documentation

## 2021-09-21 DIAGNOSIS — C7931 Secondary malignant neoplasm of brain: Secondary | ICD-10-CM | POA: Insufficient documentation

## 2021-09-22 DIAGNOSIS — C7931 Secondary malignant neoplasm of brain: Secondary | ICD-10-CM | POA: Diagnosis not present

## 2021-09-23 ENCOUNTER — Other Ambulatory Visit: Payer: Self-pay

## 2021-09-23 DIAGNOSIS — T361X5A Adverse effect of cephalosporins and other beta-lactam antibiotics, initial encounter: Secondary | ICD-10-CM | POA: Diagnosis not present

## 2021-09-23 DIAGNOSIS — N611 Abscess of the breast and nipple: Secondary | ICD-10-CM | POA: Diagnosis not present

## 2021-09-26 ENCOUNTER — Other Ambulatory Visit: Payer: Self-pay | Admitting: Cardiology

## 2021-09-28 ENCOUNTER — Other Ambulatory Visit: Payer: Self-pay | Admitting: Cardiology

## 2021-09-28 DIAGNOSIS — I10 Essential (primary) hypertension: Secondary | ICD-10-CM

## 2021-09-29 ENCOUNTER — Encounter: Payer: Self-pay | Admitting: Internal Medicine

## 2021-09-29 ENCOUNTER — Inpatient Hospital Stay: Payer: Medicare Other

## 2021-09-29 ENCOUNTER — Other Ambulatory Visit: Payer: Self-pay | Admitting: Radiation Therapy

## 2021-09-29 ENCOUNTER — Inpatient Hospital Stay (HOSPITAL_BASED_OUTPATIENT_CLINIC_OR_DEPARTMENT_OTHER): Payer: Medicare Other | Admitting: Internal Medicine

## 2021-09-29 ENCOUNTER — Other Ambulatory Visit: Payer: Self-pay

## 2021-09-29 VITALS — BP 143/70 | HR 61 | Temp 98.7°F | Resp 17 | Wt 180.5 lb

## 2021-09-29 DIAGNOSIS — C7931 Secondary malignant neoplasm of brain: Secondary | ICD-10-CM | POA: Diagnosis not present

## 2021-09-29 DIAGNOSIS — C3492 Malignant neoplasm of unspecified part of left bronchus or lung: Secondary | ICD-10-CM

## 2021-09-29 DIAGNOSIS — R5383 Other fatigue: Secondary | ICD-10-CM

## 2021-09-29 DIAGNOSIS — C3491 Malignant neoplasm of unspecified part of right bronchus or lung: Secondary | ICD-10-CM

## 2021-09-29 DIAGNOSIS — Z5112 Encounter for antineoplastic immunotherapy: Secondary | ICD-10-CM | POA: Diagnosis not present

## 2021-09-29 DIAGNOSIS — Z95828 Presence of other vascular implants and grafts: Secondary | ICD-10-CM

## 2021-09-29 DIAGNOSIS — C3412 Malignant neoplasm of upper lobe, left bronchus or lung: Secondary | ICD-10-CM | POA: Diagnosis not present

## 2021-09-29 DIAGNOSIS — Z79899 Other long term (current) drug therapy: Secondary | ICD-10-CM | POA: Diagnosis not present

## 2021-09-29 LAB — CBC WITH DIFFERENTIAL (CANCER CENTER ONLY)
Abs Immature Granulocytes: 0.01 10*3/uL (ref 0.00–0.07)
Basophils Absolute: 0 10*3/uL (ref 0.0–0.1)
Basophils Relative: 1 %
Eosinophils Absolute: 0.3 10*3/uL (ref 0.0–0.5)
Eosinophils Relative: 6 %
HCT: 38.1 % (ref 36.0–46.0)
Hemoglobin: 12 g/dL (ref 12.0–15.0)
Immature Granulocytes: 0 %
Lymphocytes Relative: 20 %
Lymphs Abs: 1.1 10*3/uL (ref 0.7–4.0)
MCH: 24.7 pg — ABNORMAL LOW (ref 26.0–34.0)
MCHC: 31.5 g/dL (ref 30.0–36.0)
MCV: 78.4 fL — ABNORMAL LOW (ref 80.0–100.0)
Monocytes Absolute: 0.6 10*3/uL (ref 0.1–1.0)
Monocytes Relative: 10 %
Neutro Abs: 3.3 10*3/uL (ref 1.7–7.7)
Neutrophils Relative %: 63 %
Platelet Count: 263 10*3/uL (ref 150–400)
RBC: 4.86 MIL/uL (ref 3.87–5.11)
RDW: 16.4 % — ABNORMAL HIGH (ref 11.5–15.5)
WBC Count: 5.3 10*3/uL (ref 4.0–10.5)
nRBC: 0 % (ref 0.0–0.2)

## 2021-09-29 LAB — CMP (CANCER CENTER ONLY)
ALT: 10 U/L (ref 0–44)
AST: 19 U/L (ref 15–41)
Albumin: 4 g/dL (ref 3.5–5.0)
Alkaline Phosphatase: 69 U/L (ref 38–126)
Anion gap: 4 — ABNORMAL LOW (ref 5–15)
BUN: 13 mg/dL (ref 8–23)
CO2: 29 mmol/L (ref 22–32)
Calcium: 9.3 mg/dL (ref 8.9–10.3)
Chloride: 107 mmol/L (ref 98–111)
Creatinine: 1.42 mg/dL — ABNORMAL HIGH (ref 0.44–1.00)
GFR, Estimated: 40 mL/min — ABNORMAL LOW (ref >60)
Glucose, Bld: 96 mg/dL (ref 70–99)
Potassium: 4 mmol/L (ref 3.5–5.1)
Sodium: 140 mmol/L (ref 135–145)
Total Bilirubin: 0.4 mg/dL (ref 0.3–1.2)
Total Protein: 6.9 g/dL (ref 6.5–8.1)

## 2021-09-29 LAB — TSH: TSH: 1.58 u[IU]/mL (ref 0.350–4.500)

## 2021-09-29 MED ORDER — SODIUM CHLORIDE 0.9 % IV SOLN: Freq: Once | INTRAVENOUS | Status: AC

## 2021-09-29 MED ORDER — SODIUM CHLORIDE 0.9% FLUSH
10.0000 mL | INTRAVENOUS | Status: DC | PRN
  Administered 2021-09-29: 10 mL

## 2021-09-29 MED ORDER — HEPARIN SOD (PORK) LOCK FLUSH 100 UNIT/ML IV SOLN
500.0000 [IU] | Freq: Once | INTRAVENOUS | Status: AC | PRN
  Administered 2021-09-29: 500 [IU]

## 2021-09-29 MED ORDER — SODIUM CHLORIDE 0.9 % IV SOLN
200.0000 mg | Freq: Once | INTRAVENOUS | Status: AC
  Administered 2021-09-29: 200 mg via INTRAVENOUS
  Filled 2021-09-29: qty 200

## 2021-09-29 NOTE — Progress Notes (Signed)
Per Dr. Julien Nordmann , it is ok to treat pt today with Pembrolizumab and creatinine of 1.42.

## 2021-09-29 NOTE — Patient Instructions (Signed)
[facility] ONCOLOGY  Discharge Instructions: Thank you for choosing [facility] to provide your oncology and hematology care.   If you have a lab appointment with the [facility], please go directly to the [facility] and check in at the registration area.   Wear comfortable clothing and clothing appropriate for easy access to any Portacath or PICC line.   We strive to give you quality time with your provider. You may need to reschedule your appointment if you arrive late (15 or more minutes).  Arriving late affects you and other patients whose appointments are after yours.  Also, if you miss three or more appointments without notifying the office, you may be dismissed from the clinic at the provider's discretion.      For prescription refill requests, have your pharmacy contact our office and allow 72 hours for refills to be completed.    Today you received the following chemotherapy and/or immunotherapy agents: Keytruda      To help prevent nausea and vomiting after your treatment, we encourage you to take your nausea medication as directed.  BELOW ARE SYMPTOMS THAT SHOULD BE REPORTED IMMEDIATELY: *FEVER GREATER THAN 100.4 F (38 C) OR HIGHER *CHILLS OR SWEATING *NAUSEA AND VOMITING THAT IS NOT CONTROLLED WITH YOUR NAUSEA MEDICATION *UNUSUAL SHORTNESS OF BREATH *UNUSUAL BRUISING OR BLEEDING *URINARY PROBLEMS (pain or burning when urinating, or frequent urination) *BOWEL PROBLEMS (unusual diarrhea, constipation, pain near the anus) TENDERNESS IN MOUTH AND THROAT WITH OR WITHOUT PRESENCE OF ULCERS (sore throat, sores in mouth, or a toothache) UNUSUAL RASH, SWELLING OR PAIN  UNUSUAL VAGINAL DISCHARGE OR ITCHING   Items with * indicate a potential emergency and should be followed up as soon as possible or go to the Emergency Department if any problems should occur.  Please show the CHEMOTHERAPY ALERT CARD or IMMUNOTHERAPY ALERT CARD at check-in to  the Emergency Department and triage nurse.  Should you have questions after your visit or need to cancel or reschedule your appointment, please contact [facility]  Dept: [phone]  and follow the prompts.  Office hours are 8:00 a.m. to 4:30 p.m. Monday - Friday. Please note that voicemails left after 4:00 p.m. may not be returned until the following business day.  We are closed weekends and major holidays. You have access to a nurse at all times for urgent questions. Please call the main number to the clinic Dept: [phone] and follow the prompts.   For any non-urgent questions, you may also contact your provider using MyChart. We now offer e-Visits for anyone 34 and older to request care online for non-urgent symptoms. For details visit [email or web address].   Also download the MyChart app! Go to the app store, search "MyChart", open the app, select [facility], and log in with your MyChart username and password.  Masks are optional in the cancer centers. If you would like for your care team to wear a mask while they are taking care of you, please let them know. You may have one support person who is at least 68 years old accompany you for your appointments.

## 2021-09-29 NOTE — Progress Notes (Signed)
[place] [phone]   [phone]  OFFICE PROGRESS NOTE  [name], [place] [id number]  DIAGNOSIS: Stage IV (T1c, N1, M1c ) non-small cell lung cancer, adenocarcinoma diagnosed in July 2020 and presented with left upper lobe lung nodule in addition to right upper lobe lung nodule with left hilar lymphadenopathy as well as multiple brain metastasis.  Biomarker Findings Microsatellite status - MS-Stable Tumor Mutational Burden - 4 Muts/Mb Genomic Findings For a complete list of the genes assayed, please refer to the Appendix. ATM G204* KRAS G12C PDGFRA P122T SMO R199W 7 Disease relevant genes with no reportable alterations: ALK, BRAF, EGFR, ERBB2, MET, RET, ROS1  PDL 1 expression was 1%  PRIOR THERAPY:  1) SRS to multiple brain metastasis under the care of Dr. Isidore Moos. 2) SRS to a 4 mm new brain metastasis under the care of Dr. Isidore Moos and Dr. Saintclair Halsted on 09/02/2020  CURRENT THERAPY: Systemic chemotherapy with carboplatin for AUC of 5, Alimta 500 mg/M2 and Keytruda 200 mg IV every 3 weeks.  First dose October 05, 2018.  Status post 52 cycles.  Starting from cycle #5 the patient will be on treatment with maintenance Alimta 500 mg/M2 and Keytruda 200 mg IV every 3 weeks.  She has been on treatment with single agent Keytruda starting from cycle #9 secondary to renal insufficiency.  INTERVAL HISTORY: LAWREN SEXSON 66 y.o. female returns to the clinic today for follow-up visit.  The patient is feeling fine today with no concerning complaints except for intermittent headache started few days ago.  She has no current chest pain but has shortness of breath with exertion with no cough or hemoptysis.  She has no nausea, vomiting, diarrhea or constipation.  She has no fever or chills.  She has been under a lot of stress recently after she lost her son to lung cancer.  She is here today for evaluation before  starting cycle #53 of her treatment.   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  MEDICATIONS:  Current Outpatient Medications  Medication Sig Dispense Refill   acetaminophen (TYLENOL) 500 MG tablet Take 1,000 mg by mouth every 8 (eight) hours as needed for mild pain, fever or headache.      amLODipine (NORVASC) 10 MG tablet Take 1 tablet by mouth once daily 90 tablet 0   aspirin EC 81 MG tablet Take 81 mg by mouth daily.     carvedilol (COREG) 25 MG tablet Take 1/2 (one-half) tablet by mouth twice daily 180 tablet 0   Cholecalciferol (VITAMIN  D-3) 125 MCG (5000 UT) TABS Take 5,000 Units by mouth daily.     empagliflozin (JARDIANCE) 25 MG TABS tablet Take 1 tablet (25 mg total) by mouth daily before breakfast. 90 tablet 3   esomeprazole (NEXIUM) 20 MG capsule Take 20 mg by mouth daily with breakfast.     ezetimibe (ZETIA) 10 MG tablet TAKE 1 TABLET BY MOUTH ONCE DAILY AFTER SUPPER 90 tablet 0   fexofenadine (ALLEGRA) 180 MG tablet Take 180 mg by mouth daily.     fluticasone (FLONASE) 50 MCG/ACT nasal spray Place 1 spray into both nostrils daily as needed for allergies or rhinitis.     hydroxychloroquine (PLAQUENIL) 200 MG tablet Take 200 mg by mouth daily.     Lancets (ONETOUCH DELICA PLUS [id number]) MISC SMARTSIG:1 Topical 3 Times Daily      levocetirizine (XYZAL) 5 MG tablet Take 5 mg by mouth daily as needed for allergies.     lidocaine-prilocaine (EMLA) cream Apply to the Port-A-Cath site 30 to 60 minutes before chemotherapy. 30 g 2   LIVALO 4 MG TABS TAKE 1 TABLET BY MOUTH AT BEDTIME 90 tablet 0   metFORMIN (GLUCOPHAGE) 500 MG tablet Take 500 mg by mouth daily with supper.     methocarbamol (ROBAXIN) 500 MG tablet Take 1 tablet (500 mg total) by mouth every 8 (eight) hours as needed for muscle spasms. 30 tablet 0   montelukast (SINGULAIR) 10 MG tablet Take 10 mg by mouth daily as needed (allergies).     nitroGLYCERIN (NITROSTAT) 0.4 MG SL tablet Place 1 tablet (0.4 mg total) under the tongue every 5 (five) minutes as needed for chest pain. 25 tablet 2   ONETOUCH ULTRA test strip USE 1 STRIP TO CHECK GLUCOSE THREE TIMES DAILY     oxyCODONE (OXY IR/ROXICODONE) 5 MG immediate release tablet Take 1 tablet (5 mg total) by mouth every 6 (six) hours as needed for severe pain. (Patient not taking: Reported on 06/24/2021) 40 tablet 0   pentoxifylline (TRENTAL) 400 MG CR tablet Take $RemoveBef'400mg'hbbYfIuYum$  tab daily x 1 week, then $RemoveBe'400mg'NgAgKWnCI$  BID; take with food (Patient taking differently: 400 mg 2 (two) times daily with breakfast and lunch.) 60 tablet 5   Polyethyl Glycol-Propyl Glycol (SYSTANE) 0.4-0.3 % GEL ophthalmic gel Place 1 application. into both eyes every 6 (six) hours as needed (dry eyes).     predniSONE (DELTASONE) 5 MG tablet See admin instructions.     telmisartan (MICARDIS) 40 MG tablet Take 40 mg by mouth daily.     vitamin E 180 MG (400 UNITS) capsule Take 400 IU daily x 1 week, then 400 IU BID (Patient taking differently: Take 400 Units by mouth in the morning and at bedtime.) 60 capsule 5   No current facility-administered medications for this visit.    SURGICAL HISTORY:  Past Surgical History:  Procedure Laterality Date   ABDOMINAL HYSTERECTOMY     bone spur     CARDIAC CATHETERIZATION  06/13/2012   carpel tunnel surgery Left     COLONOSCOPY     9 years ago   CORONARY ANGIOPLASTY  06/13/2012   EYE SURGERY     cyst left eye    IR IMAGING GUIDED PORT INSERTION  10/10/2018   LEFT HEART CATHETERIZATION WITH CORONARY ANGIOGRAM N/A 06/13/2012   Procedure: LEFT HEART CATHETERIZATION WITH CORONARY ANGIOGRAM;  Surgeon: Laverda Page, MD;  Location: [facility] CATH LAB;  Service: Cardiovascular;  Laterality: N/A;   NECK SURGERY     rotator cuff surgery Right  2015   SHOULDER ARTHROSCOPY WITH ROTATOR CUFF REPAIR AND SUBACROMIAL DECOMPRESSION Left 05/14/2021   Procedure: LEFT SHOULDER ARTHROSCOPY, SUBACROMIAL DECOMPRESSION, BICEPS TENODESIS, MINI OPEN ROTATOR CUFF REPAIR;  Surgeon: Meredith Pel, MD;  Location: [facility];  Service: Orthopedics;  Laterality: Left;   VIDEO BRONCHOSCOPY WITH ENDOBRONCHIAL NAVIGATION N/A 09/06/2018   Procedure: VIDEO BRONCHOSCOPY WITH ENDOBRONCHIAL NAVIGATION, left upper lung;  Surgeon: Lajuana Matte, MD;  Location[facility];  Service: Thoracic;  Laterality: N/A;   VIDEO BRONCHOSCOPY WITH ENDOBRONCHIAL ULTRASOUND Left 09/06/2018   Procedure: VIDEO BRONCHOSCOPY WITH ENDOBRONCHIAL ULTRASOUND, left lung;  Surgeon: Lajuana Matte, MD;  Location: [facility];  Service: Thoracic;  Laterality: Left;   WISDOM TOOTH EXTRACTION      REVIEW OF SYSTEMS:  A comprehensive review of systems was negative except for: Respiratory: positive for dyspnea on exertion Neurological: positive for headaches   PHYSICAL EXAMINATION: General appearance: alert, cooperative, fatigued, and no distress Head: Normocephalic, without obvious abnormality, atraumatic Neck: no adenopathy, no JVD, supple, symmetrical, trachea midline, and thyroid not enlarged, symmetric, no tenderness/mass/nodules Lymph nodes: Cervical, supraclavicular, and axillary nodes normal. Resp: clear to auscultation bilaterally Back: symmetric, no curvature. ROM normal. No CVA tenderness. Cardio: regular rate and rhythm, S1, S2 normal, no murmur, click, rub or  gallop GI: soft, non-tender; bowel sounds normal; no masses,  no organomegaly Extremities: extremities normal, atraumatic, no cyanosis or edema  ECOG PERFORMANCE STATUS: 1 - Symptomatic but completely ambulatory  Blood pressure (!) 143/70, pulse 61, temperature 98.7 F (37.1 C), temperature source Oral, resp. rate 17, weight 180 lb 8 oz (81.9 kg), SpO2 100 %.  LABORATORY DATA: Lab Results  Component Value Date   WBC 5.3 09/29/2021   HGB 12.0 09/29/2021   HCT 38.1 09/29/2021   MCV 78.4 (L) 09/29/2021   PLT 263 09/29/2021      Chemistry      Component Value Date/Time   NA 140 09/29/2021 1139   NA 139 08/18/2020 0801   K 4.0 09/29/2021 1139   CL 107 09/29/2021 1139   CO2 29 09/29/2021 1139   BUN 13 09/29/2021 1139   BUN 16 08/18/2020 0801   CREATININE 1.42 (H) 09/29/2021 1139      Component Value Date/Time   CALCIUM 9.3 09/29/2021 1139   ALKPHOS 69 09/29/2021 1139   AST 19 09/29/2021 1139   ALT 10 09/29/2021 1139   BILITOT 0.4 09/29/2021 1139       RADIOGRAPHIC STUDIES: MR Brain W Wo Contrast  Result Date: 09/21/2021 CLINICAL DATA:  66 year old female with metastatic lung cancer status post whole brain radiation in 2020. Small left temporal lobe and right frontal metastases treated with SRS. Restaging. EXAM: MRI HEAD WITHOUT AND WITH CONTRAST TECHNIQUE: Multiplanar, multiecho pulse sequences of the brain and surrounding structures were obtained without and with intravenous contrast. CONTRAST:  5mL MULTIHANCE GADOBENATE DIMEGLUMINE 529 MG/ML IV SOLN COMPARISON:  06/18/2021, 03/19/2021 and earlier. FINDINGS: Brain: Further regression of left anterior temporal lobe T2 and FLAIR hyperintensity since May. And underlying patchy enhancement there has regressed (series 13, image 62 versus series 12, image 62 previously). Size of the patchy enhancement there has also decreased, now 12-15 mm long axis (previously up to 20 mm). Small treated right superior frontal gyrus metastasis on  series 13, image 126 remains stable following contrast, although regional T2 and FLAIR hyperintensity has increased on series 10, image 49, but remains mild. No significant regional mass effect. Punctate new periventricular enhancement at the left lateral ventricle atrium on series  13, image 87, also series 16 image 26. Patchy periatrial nonspecific T2 and FLAIR hyperintensity appears chronic and stable. This enhancement might be vascular, uncertain. No other new enhancement identified. No dural thickening. Stable underlying cerebral volume. Chronic posteroinferior cerebellar encephalomalacia. No restricted diffusion to suggest acute infarction. No midline shift, ventriculomegaly, extra-axial collection or acute intracranial hemorrhage. Cervicomedullary junction and pituitary are within normal limits. Stable other nonspecific cerebral white matter T2 and FLAIR hyperintensity. Vascular: Major intracranial vascular flow voids are stable. Major dural venous sinuses appear to be enhancing and patent. Skull and upper cervical spine: Previous cervical ACDF. Visible bone marrow signal is stable from earlier this year. Degenerative ligamentous hypertrophy about the odontoid. Negative visible cervical spinal cord. Sinuses/Orbits: Stable, negative. Other: Mastoids remain clear. Visible internal auditory structures appear normal. Negative visible scalp and face. IMPRESSION: 1. Punctate new left periatrial, periventricular enhancement is nonspecific (series 13, image 87). This might be vascular artifact, but early metastasis is not excluded. Recommend attention on follow-up. 2. Otherwise regression of the treated left anterior temporal lobe metastasis since May. And stable size of the smaller treated right superior frontal metastasis although regional T2/FLAIR hyperintensity has increased. Favor treatment effect at this time. Electronically Signed   By: Genevie Ann M.D.   On: 09/21/2021 06:34     ASSESSMENT AND PLAN: This is a  very pleasant 66 years old African-American female with likely stage IV (T1c, N1, M1C) non-small cell lung cancer, adenocarcinoma with positive KRAS G12C mutation diagnosed in July 2020 and presented with left upper lobe lung nodule in addition to right upper lobe pulmonary nodule as well as left hilar adenopathy and metastatic lesion to the brain. Molecular studies showed no actionable mutation and PDL 1 expression was 1%. The patient underwent SBRT to the metastatic brain lesion under the care of Dr. Isidore Moos. The patient is currently undergoing systemic chemotherapy with carboplatin for AUC of 5, Alimta 500 mg/M2 and Keytruda 200 mg IV every 3 weeks is status post 52 cycles.  Starting from cycle #5 she is on maintenance treatment with Alimta and Keytruda every 3 weeks with only Keytruda on the last few cycles because of the renal insufficiency. She has been tolerating this treatment well with no concerning adverse effects. I recommended for her to proceed with cycle #53 of her treatment today as planned. For the headache she had recent MRI of the brain that showed punctate new left.  Atrial periventricular enhancement that is nonspecific and otherwise there was regression of the treated left anterior temporal lobe metastasis since May 2023. I will see the patient back for follow-up visit in 3 weeks for evaluation before the next cycle of her treatment. For the renal insufficiency, she is followed by nephrology. She was advised to call immediately if she has any other concerning symptoms in the interval. The patient voices understanding of current disease status and treatment options and is in agreement with the current care plan. All questions were answered. The patient knows to call the clinic with any problems, questions or concerns. We can certainly see the patient much sooner if necessary.  Disclaimer: This note was dictated with voice recognition software. Similar sounding words can inadvertently  be transcribed and may not be corrected upon review.

## 2021-10-01 ENCOUNTER — Telehealth: Payer: Self-pay | Admitting: Internal Medicine

## 2021-10-01 NOTE — Telephone Encounter (Signed)
Scheduled per 08/22 workqueue, patient has been called and voicemail was left.

## 2021-10-02 ENCOUNTER — Ambulatory Visit: Payer: Medicare Other | Admitting: Cardiology

## 2021-10-02 ENCOUNTER — Encounter: Payer: Self-pay | Admitting: Cardiology

## 2021-10-02 VITALS — BP 135/74 | HR 68 | Temp 98.0°F | Resp 16 | Ht 61.0 in | Wt 180.0 lb

## 2021-10-02 DIAGNOSIS — I10 Essential (primary) hypertension: Secondary | ICD-10-CM

## 2021-10-02 DIAGNOSIS — I25118 Atherosclerotic heart disease of native coronary artery with other forms of angina pectoris: Secondary | ICD-10-CM | POA: Diagnosis not present

## 2021-10-02 DIAGNOSIS — N1832 Chronic kidney disease, stage 3b: Secondary | ICD-10-CM | POA: Diagnosis not present

## 2021-10-02 DIAGNOSIS — E7800 Pure hypercholesterolemia, unspecified: Secondary | ICD-10-CM | POA: Diagnosis not present

## 2021-10-02 NOTE — Progress Notes (Signed)
Primary Physician/Referring:  [name], FNP  Patient ID: Carolyn [name], female    DOB: 06/10/1955, 66 y.o.   MRN: [id number]  Chief Complaint  Patient presents with   Coronary Artery Disease   Hypertension   Hyperlipidemia   Follow-up    1 year   HPI:    HPI: Carolyn [name]  is a 66 y.o.  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  Extremely positive attitude and has been doing well. She continues to follow with oncology.   Patient is presently doing well.  On her last office visit 6 weeks ago, I had started her on Jardiance 25 mg daily for diabetes mellitus which she is tolerating, blood pressure is improved and also she has lost about 4 pounds in weight in the last 6 weeks.   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   Hypertension    nscl ca dx'd 08/07/18   Persistent headaches    h/o   Pneumonia    hx of PNA   Shortness of breath    Vaginal dryness    h/o   Family History  Problem Relation Age of Onset   Breast cancer Other    Diabetes Mother    Glaucoma Mother    Hypertension Mother    Hypertension Father    Asthma Father    Diabetes Sister    Diabetes Brother    Hypertension Sister    Past Surgical History:  Procedure Laterality Date   ABDOMINAL HYSTERECTOMY     bone spur      CARDIAC CATHETERIZATION  06/13/2012   carpel tunnel surgery Left    COLONOSCOPY     9 years ago   CORONARY ANGIOPLASTY  06/13/2012   EYE SURGERY     cyst left eye    IR IMAGING GUIDED PORT INSERTION  10/10/2018   LEFT HEART CATHETERIZATION WITH CORONARY ANGIOGRAM N/A 06/13/2012   Procedure: LEFT HEART CATHETERIZATION WITH CORONARY ANGIOGRAM;  Surgeon: Pamella Pert, MD;  Location: [facility] CATH LAB;  Service: Cardiovascular;  Laterality: N/A;   NECK SURGERY     rotator cuff surgery Right 2015   SHOULDER ARTHROSCOPY WITH ROTATOR CUFF REPAIR AND SUBACROMIAL DECOMPRESSION Left 05/14/2021   Procedure: LEFT SHOULDER ARTHROSCOPY, SUBACROMIAL DECOMPRESSION, BICEPS TENODESIS, MINI OPEN ROTATOR CUFF REPAIR;  Surgeon: Cammy Copa, MD;  Location: [facility] OR;  Service: Orthopedics;  Laterality: Left;   VIDEO BRONCHOSCOPY WITH ENDOBRONCHIAL NAVIGATION N/A 09/06/2018   Procedure: VIDEO BRONCHOSCOPY WITH ENDOBRONCHIAL NAVIGATION, left upper lung;  Surgeon: Corliss Skains, MD;  Location: [facility] OR;  Service: Thoracic;  Laterality: N/A;   VIDEO BRONCHOSCOPY WITH ENDOBRONCHIAL ULTRASOUND Left 09/06/2018   Procedure: VIDEO BRONCHOSCOPY  WITH ENDOBRONCHIAL ULTRASOUND, left lung;  Surgeon: Lajuana Matte, MD;  Location: [facility] OR;  Service: Thoracic;  Laterality: Left;   WISDOM TOOTH EXTRACTION     Social History   Tobacco Use   Smoking status: Former    Packs/day: 0.50    Years: 30.00    Total pack years: 15.00    Types: Cigarettes    Quit date: 02/09/1999    Years since quitting: 22.6   Smokeless tobacco: Never  Substance Use Topics   Alcohol use: Not Currently  Marital Status: Married    ROS  Review of Systems  Cardiovascular:  Negative for chest pain, dyspnea on exertion and leg swelling.  Musculoskeletal:  Positive for arthritis.  Gastrointestinal:  Negative for melena.   Objective  Blood pressure 135/74, pulse 68, temperature 98 F (36.7 C), resp. rate 16, height $RemoveBe'5\' 1"'UezcxZPPM$  (1.549 m), weight 180 lb  (81.6 kg), SpO2 99 %. Body mass index is 34.01 kg/m.      10/02/2021    8:30 AM 09/29/2021   11:41 AM 08/19/2021   10:37 AM  Vitals with BMI  Height $Remov'5\' 1"'WiujLx$   '5\' 1"'$   Weight 180 lbs 180 lbs 8 oz 177 lbs 6 oz  BMI 03.52  48.18  Systolic 590 931 121  Diastolic 74 70 63  Pulse 68 61 65    Physical Exam Vitals reviewed.  Constitutional:      Comments: Moderately built and mildly obese  HENT:     Head: Normocephalic and atraumatic.  Cardiovascular:     Rate and Rhythm: Normal rate and regular rhythm.     Pulses: Intact distal pulses.     Heart sounds: Normal heart sounds.     Comments: No JVD, No leg edema Pulmonary:     Effort: Pulmonary effort is normal. No accessory muscle usage or respiratory distress.     Breath sounds: Normal breath sounds.  Abdominal:     General: Bowel sounds are normal.     Palpations: Abdomen is soft.  Musculoskeletal:        General: Normal range of motion.     Cervical back: Normal range of motion.  Neurological:     General: No focal deficit present.     Mental Status: She is alert and oriented to person, place, and time.     Laboratory examination:   Recent Labs    07/28/21 0742 08/19/21 0949 09/29/21 1139  NA 140 139 140  K 4.1 3.6 4.0  CL 107 103 107  CO2 $Re'26 27 29  'Xgh$ GLUCOSE 108* 157* 96  BUN 28* 20 13  CREATININE 1.71* 1.86* 1.42*  CALCIUM 9.2 9.4 9.3  GFRNONAA 32* 29* 40*      Latest Ref Rng & Units 09/29/2021   11:39 AM 08/19/2021    9:49 AM 07/28/2021    7:42 AM  CMP  Glucose 70 - 99 mg/dL 96  157  108   BUN 8 - 23 mg/dL $Remove'13  20  28   'ZQfndET$ Creatinine 0.44 - 1.00 mg/dL 1.42  1.86  1.71   Sodium 135 - 145 mmol/L 140  139  140   Potassium 3.5 - 5.1 mmol/L 4.0  3.6  4.1   Chloride 98 - 111 mmol/L 107  103  107   CO2 22 - 32 mmol/L $RemoveB'29  27  26   'yzvnYbAJ$ Calcium 8.9 - 10.3 mg/dL 9.3  9.4  9.2   Total Protein 6.5 - 8.1 g/dL 6.9  7.2  6.7   Total  Bilirubin 0.3 - 1.2 mg/dL 0.4  0.5  0.3   Alkaline Phos 38 - 126 U/L 69  60  62   AST 15 - 41 U/L  $Re'19  21  14   'ciz$ ALT 0 - 44 U/L $Remo'10  14  14       'kFPDA$ Latest Ref Rng & Units 09/29/2021   11:39 AM 08/19/2021    9:49 AM 07/28/2021    7:42 AM  CBC  WBC 4.0 - 10.5 K/uL 5.3  6.4  11.1   Hemoglobin 12.0 - 15.0 g/dL 12.0  12.2  11.9   Hematocrit 36.0 - 46.0 % 38.1  38.8  38.7   Platelets 150 - 400 K/uL 263  266  219     TSH Recent Labs    07/28/21 0742 08/19/21 0949 09/29/21 1139  TSH 1.177 1.164 1.580   External Labs 02/20/2018  Labs 04/07/2021:  A1c 6.8%.  Vitamin D67.8.  Hb 12.7/HCT 40.9, platelets 260.  BUN 18, creatinine 1.79, potassium 4.8, EGFR 29/33.  Uric acid normal at 5.5.  CRP 0.41.  Urine analysis: Urine glucose >thousand.  Urinary protein negative.  Total cholesterol 143, triglycerides 64, HDL 70, LDL 60. Allergies   Allergies  Allergen Reactions   Atorvastatin Other (See Comments)    Severe myalgia   Crestor [Rosuvastatin] Other (See Comments)    Severe myalgias   Lisinopril Swelling    Lips and face swelled up.   Cephalexin Other (See Comments)     Final Medications at End of Visit     Current Outpatient Medications:    acetaminophen (TYLENOL) 500 MG tablet, Take 1,000 mg by mouth every 8 (eight) hours as needed for mild pain, fever or headache. , Disp: , Rfl:    amLODipine (NORVASC) 10 MG tablet, Take 1 tablet by mouth once daily, Disp: 90 tablet, Rfl: 0   aspirin EC 81 MG tablet, Take 81 mg by mouth daily., Disp: , Rfl:    carvedilol (COREG) 25 MG tablet, Take 1/2 (one-half) tablet by mouth twice daily, Disp: 180 tablet, Rfl: 0   Cholecalciferol (VITAMIN D-3) 125 MCG (5000 UT) TABS, Take 5,000 Units by mouth daily., Disp: , Rfl:    empagliflozin (JARDIANCE) 25 MG TABS tablet, Take 1 tablet (25 mg total) by mouth daily before breakfast., Disp: 90 tablet, Rfl: 3   esomeprazole (NEXIUM) 20 MG capsule, Take 20 mg by mouth daily with breakfast., Disp: , Rfl:    ezetimibe (ZETIA) 10 MG tablet, TAKE 1 TABLET BY MOUTH ONCE DAILY AFTER SUPPER, Disp: 90 tablet,  Rfl: 0   fexofenadine (ALLEGRA) 180 MG tablet, Take 180 mg by mouth daily., Disp: , Rfl:    fluticasone (FLONASE) 50 MCG/ACT nasal spray, Place 1 spray into both nostrils daily as needed for allergies or rhinitis., Disp: , Rfl:    hydroxychloroquine (PLAQUENIL) 200 MG tablet, Take 200 mg by mouth daily., Disp: , Rfl:    Lancets (ONETOUCH DELICA PLUS OINOMV67M) MISC, SMARTSIG:1 Topical 3 Times Daily, Disp: , Rfl:    lidocaine-prilocaine (EMLA) cream, Apply to the Port-A-Cath site 30 to 60 minutes before chemotherapy., Disp: 30 g, Rfl: 2   LIVALO 4 MG TABS, TAKE 1 TABLET BY MOUTH AT BEDTIME, Disp: 90 tablet, Rfl: 0   metFORMIN (GLUCOPHAGE) 500 MG tablet, Take 500 mg by mouth daily with supper., Disp: , Rfl:    methocarbamol (ROBAXIN) 500 MG tablet, Take 1 tablet (500 mg total) by mouth every 8 (eight) hours as needed for muscle spasms., Disp: 30  tablet, Rfl: 0   montelukast (SINGULAIR) 10 MG tablet, Take 10 mg by mouth daily as needed (allergies)., Disp: , Rfl:    nitroGLYCERIN (NITROSTAT) 0.4 MG SL tablet, Place 1 tablet (0.4 mg total) under the tongue every 5 (five) minutes as needed for chest pain., Disp: 25 tablet, Rfl: 2   ONETOUCH ULTRA test strip, USE 1 STRIP TO CHECK GLUCOSE THREE TIMES DAILY, Disp: , Rfl:    Polyethyl Glycol-Propyl Glycol (SYSTANE) 0.4-0.3 % GEL ophthalmic gel, Place 1 application. into both eyes every 6 (six) hours as needed (dry eyes)., Disp: , Rfl:    telmisartan (MICARDIS) 40 MG tablet, Take 40 mg by mouth daily., Disp: , Rfl:    vitamin E 180 MG (400 UNITS) capsule, Take 400 IU daily x 1 week, then 400 IU BID (Patient taking differently: Take 400 Units by mouth in the morning and at bedtime.), Disp: 60 capsule, Rfl: 5   Radiology:  No results found.   Cardiac Studies:   cardiac catheterization on 06/13/2012: High-grade 99% mid LAD stenosis, successful PTCA and stenting with 4.0 x 24 mm Veriflex non drug eluting stent.  Exercise tetrofosmin stress test   04/07/2020: Normal ECG stress. The patient exercised for 7 minutes and 58 seconds of a Bruce protocol, achieving approximately 10.11 METs.  No chest pain. NOrmap BP response. Myocardial perfusion is normal. Overall LV systolic function is normal without regional wall motion abnormalities. Stress LV EF: 64%. No previous exam available for comparison. Low risk .  Echocardiogram 04/09/2020:  Left ventricle cavity is normal in size. Moderate concentric hypertrophy  of the left ventricle. Normal global wall motion. Normal LV systolic  function with EF 61%. Doppler evidence of grade I (impaired) diastolic  dysfunction, normal LAP.  Left atrial cavity is mildly dilated.  Mild (Grade I) mitral regurgitation.  Mild tricuspid regurgitation.  No evidence of pulmonary hypertension.  Study in 2014 showed mild LVH, trace MR and TR>  EKG   EKG 10/02/2021: Normal sinus rhythm at the rate of 64 bpm, left atrial enlargement, normal axis, poor R wave progression, probably normal variant.  No evidence of ischemia.  Compared to 01/25/2021, no significant change.  Assessment     ICD-10-CM   1. Primary hypertension  I10 EKG 12-Lead    2. Atherosclerosis of native coronary artery of native heart with stable angina pectoris ([place])  I25.118     3. Hypercholesteremia  E78.00     4. Stage 3b chronic kidney disease (HCC)  N18.32        No orders of the defined types were placed in this encounter.  Medications Discontinued During This Encounter  Medication Reason   levocetirizine (XYZAL) 5 MG tablet    oxyCODONE (OXY IR/ROXICODONE) 5 MG immediate release tablet    pentoxifylline (TRENTAL) 400 MG CR tablet    predniSONE (DELTASONE) 5 MG tablet     Orders Placed This Encounter  Procedures   EKG 12-Lead    Recommendations:   Mrs. Sharyah Bostwick is a 66 y.o.  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  Extremely positive attitude and has been doing well. She continues to follow with oncology.  She is presently responding well to immunotherapy.  Continues to remain fairly active.  Patient is presently  doing well.  I reviewed her lipids, LDL is at goal.  She has not had any recurrence of angina pectoris and has not used any sublingual nitroglycerin in the recent past.  With regard to hypertension, blood pressure is well controlled.  Renal function has remained stable at stage IIIb chronic kidney disease-stage IV chronic kidney disease.  I will see her back on an annual basis.      Adrian Prows, MD, [facility] 10/02/2021, 9:03 AM Office: [phone] Fax: [phone] Pager: [phone]

## 2021-10-03 ENCOUNTER — Other Ambulatory Visit: Payer: Self-pay

## 2021-10-08 DIAGNOSIS — C3490 Malignant neoplasm of unspecified part of unspecified bronchus or lung: Secondary | ICD-10-CM | POA: Diagnosis not present

## 2021-10-08 DIAGNOSIS — M1990 Unspecified osteoarthritis, unspecified site: Secondary | ICD-10-CM | POA: Diagnosis not present

## 2021-10-08 DIAGNOSIS — M79642 Pain in left hand: Secondary | ICD-10-CM | POA: Diagnosis not present

## 2021-10-08 DIAGNOSIS — Z79899 Other long term (current) drug therapy: Secondary | ICD-10-CM | POA: Diagnosis not present

## 2021-10-08 DIAGNOSIS — N1831 Chronic kidney disease, stage 3a: Secondary | ICD-10-CM | POA: Diagnosis not present

## 2021-10-08 DIAGNOSIS — M359 Systemic involvement of connective tissue, unspecified: Secondary | ICD-10-CM | POA: Diagnosis not present

## 2021-10-08 DIAGNOSIS — M7989 Other specified soft tissue disorders: Secondary | ICD-10-CM | POA: Diagnosis not present

## 2021-10-08 DIAGNOSIS — R768 Other specified abnormal immunological findings in serum: Secondary | ICD-10-CM | POA: Diagnosis not present

## 2021-10-09 ENCOUNTER — Ambulatory Visit (INDEPENDENT_AMBULATORY_CARE_PROVIDER_SITE_OTHER): Payer: Medicare Other | Admitting: Orthopedic Surgery

## 2021-10-09 ENCOUNTER — Encounter: Payer: Self-pay | Admitting: Orthopedic Surgery

## 2021-10-09 DIAGNOSIS — M75122 Complete rotator cuff tear or rupture of left shoulder, not specified as traumatic: Secondary | ICD-10-CM

## 2021-10-09 NOTE — Progress Notes (Signed)
Post-Op Visit Note   Patient: Carolyn [name]           Date of Birth: [birth year]           MRN: [id number] Visit Date: 10/09/2021 PCP: [name], FNP   Assessment & Plan:  Chief Complaint:  Chief Complaint  Patient presents with   Left Shoulder - Follow-up   Visit Diagnoses:  1. Complete tear of left rotator cuff, unspecified whether traumatic     Plan: Patient presents for evaluation of left shoulder arthroscopy and rotator cuff tear repair.  She is 5 months out.  Out of physical therapy.  Gets a little achy at times.  Does feel some weakness.  Sleeping is not a shoulder issue for her.  She is able to do almost all of her activities of daily living with minimal symptoms.  Overall the pain has improved but she still has some weakness.  On examination she is got good active and passive shoulder range of motion with some coarse grinding and crepitus.  The the grinding is very minimal but present.  6 5- out of 5 external rotation strength on the left compared to the right.  Incision intact.  No other masses lymphadenopathy or skin changes noted in that shoulder girdle region.  Follow-Up Instructions: No follow-ups on file.   Orders:  No orders of the defined types were placed in this encounter.  No orders of the defined types were placed in this encounter.   Imaging: No results found.  PMFS History: Patient Active Problem List   Diagnosis Date Noted   Complete tear of left rotator cuff    Biceps tendonitis, left    Degenerative superior labral anterior-to-posterior (SLAP) tear of left shoulder    Elevated serum creatinine 04/12/2019   Pyelonephritis 02/08/2019   Port-A-Cath in place 10/19/2018   Encounter for antineoplastic chemotherapy 09/28/2018   Encounter for antineoplastic immunotherapy 09/28/2018   Goals of care, counseling/discussion 09/28/2018   Adenocarcinoma, lung ([place]) 09/12/2018   Malignant neoplasm metastatic to brain [facility]) 08/30/2018   Mass  of upper lobe of left lung 08/17/2018   Angina pectoris ([place]) 06/14/2012   CAD (coronary artery disease), native coronary artery 06/14/2012   S/P PTCA (percutaneous transluminal coronary angioplasty) 06/14/2012   Hyperlipidemia 06/14/2012   Essential hypertension 06/14/2012   Pre-diabetes 06/14/2012   Past Medical History:  Diagnosis Date   Anginal pain ([place])    " with exertion "   Arthritis    " 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 "   Asthma    h/o   Brain metastases 08/2018   Chronic kidney disease    Coronary artery disease    Diabetes mellitus without complication (HCC)    Type II   GERD (gastroesophageal reflux disease)    H/O hiatal hernia    Heart murmur    History of radiation therapy 09/22/2018   stereotactic radiosurgery    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   Hypertension    nscl ca dx'd 08/07/18   Persistent headaches    h/o   Pneumonia    hx of PNA   Shortness of breath    Vaginal dryness    h/o    Family History  Problem Relation Age of Onset   Breast cancer Other    Diabetes Mother    Glaucoma Mother    Hypertension Mother    Hypertension Father    Asthma Father    Diabetes Sister    Diabetes Brother  Hypertension Sister     Past Surgical History:  Procedure Laterality Date   ABDOMINAL HYSTERECTOMY     bone spur     CARDIAC CATHETERIZATION  06/13/2012   carpel tunnel surgery Left    COLONOSCOPY     9 years ago   CORONARY ANGIOPLASTY  06/13/2012   EYE SURGERY     cyst left eye    IR IMAGING GUIDED PORT INSERTION  10/10/2018   LEFT HEART CATHETERIZATION WITH CORONARY ANGIOGRAM N/A 06/13/2012   Procedure: LEFT HEART CATHETERIZATION WITH CORONARY ANGIOGRAM;  Surgeon: Laverda Page, MD;  Location[facility] CATH LAB;  Service: Cardiovascular;  Laterality: N/A;   NECK SURGERY     rotator cuff surgery Right 2015   SHOULDER ARTHROSCOPY WITH ROTATOR CUFF REPAIR AND SUBACROMIAL DECOMPRESSION Left 05/14/2021   Procedure: LEFT SHOULDER ARTHROSCOPY, SUBACROMIAL DECOMPRESSION, BICEPS TENODESIS,  MINI OPEN ROTATOR CUFF REPAIR;  Surgeon: Meredith Pel, MD;  Location: [place];  Service: Orthopedics;  Laterality: Left;   VIDEO BRONCHOSCOPY WITH ENDOBRONCHIAL NAVIGATION N/A 09/06/2018   Procedure: VIDEO BRONCHOSCOPY WITH ENDOBRONCHIAL NAVIGATION, left upper lung;  Surgeon: Lajuana Matte, MD;  Location: [facility] OR;  Service: Thoracic;  Laterality: N/A;   VIDEO BRONCHOSCOPY WITH ENDOBRONCHIAL ULTRASOUND Left 09/06/2018   Procedure: VIDEO BRONCHOSCOPY WITH ENDOBRONCHIAL ULTRASOUND, left lung;  Surgeon: Lajuana Matte, MD;  Location: [facility] OR;  Service: Thoracic;  Laterality: Left;   WISDOM TOOTH EXTRACTION     Social History   Occupational History   Not on file  Tobacco Use   Smoking status: Former    Packs/day: 0.50    Years: 30.00    Total pack years: 15.00    Types: Cigarettes    Quit date: 02/09/1999    Years since quitting: 22.6   Smokeless tobacco: Never  Vaping Use   Vaping Use: Never used  Substance and Sexual Activity   Alcohol use: Not Currently   Drug use: Not Currently   Sexual activity: Yes    Birth control/protection: Post-menopausal

## 2021-10-15 DIAGNOSIS — E785 Hyperlipidemia, unspecified: Secondary | ICD-10-CM | POA: Diagnosis not present

## 2021-10-15 DIAGNOSIS — E119 Type 2 diabetes mellitus without complications: Secondary | ICD-10-CM | POA: Diagnosis not present

## 2021-10-15 DIAGNOSIS — E559 Vitamin D deficiency, unspecified: Secondary | ICD-10-CM | POA: Diagnosis not present

## 2021-10-15 DIAGNOSIS — Z0000 Encounter for general adult medical examination without abnormal findings: Secondary | ICD-10-CM | POA: Diagnosis not present

## 2021-10-20 ENCOUNTER — Encounter: Payer: Self-pay | Admitting: Internal Medicine

## 2021-10-20 ENCOUNTER — Inpatient Hospital Stay (HOSPITAL_BASED_OUTPATIENT_CLINIC_OR_DEPARTMENT_OTHER): Payer: Medicare Other | Admitting: Internal Medicine

## 2021-10-20 ENCOUNTER — Inpatient Hospital Stay: Payer: Medicare Other

## 2021-10-20 ENCOUNTER — Inpatient Hospital Stay: Payer: Medicare Other | Attending: Physician Assistant

## 2021-10-20 ENCOUNTER — Ambulatory Visit: Payer: Medicare Other

## 2021-10-20 ENCOUNTER — Other Ambulatory Visit: Payer: Self-pay

## 2021-10-20 ENCOUNTER — Ambulatory Visit: Payer: Medicare Other | Admitting: Physician Assistant

## 2021-10-20 ENCOUNTER — Other Ambulatory Visit: Payer: Medicare Other

## 2021-10-20 VITALS — BP 141/60 | HR 72 | Resp 16

## 2021-10-20 DIAGNOSIS — C3412 Malignant neoplasm of upper lobe, left bronchus or lung: Secondary | ICD-10-CM | POA: Diagnosis not present

## 2021-10-20 DIAGNOSIS — Z95828 Presence of other vascular implants and grafts: Secondary | ICD-10-CM

## 2021-10-20 DIAGNOSIS — C3491 Malignant neoplasm of unspecified part of right bronchus or lung: Secondary | ICD-10-CM

## 2021-10-20 DIAGNOSIS — Z79899 Other long term (current) drug therapy: Secondary | ICD-10-CM | POA: Insufficient documentation

## 2021-10-20 DIAGNOSIS — C3492 Malignant neoplasm of unspecified part of left bronchus or lung: Secondary | ICD-10-CM

## 2021-10-20 DIAGNOSIS — C7931 Secondary malignant neoplasm of brain: Secondary | ICD-10-CM | POA: Insufficient documentation

## 2021-10-20 DIAGNOSIS — Z5112 Encounter for antineoplastic immunotherapy: Secondary | ICD-10-CM | POA: Insufficient documentation

## 2021-10-20 DIAGNOSIS — R5383 Other fatigue: Secondary | ICD-10-CM

## 2021-10-20 LAB — CMP (CANCER CENTER ONLY)
ALT: 10 U/L (ref 0–44)
AST: 18 U/L (ref 15–41)
Albumin: 4 g/dL (ref 3.5–5.0)
Alkaline Phosphatase: 78 U/L (ref 38–126)
Anion gap: 7 (ref 5–15)
BUN: 16 mg/dL (ref 8–23)
CO2: 27 mmol/L (ref 22–32)
Calcium: 9.5 mg/dL (ref 8.9–10.3)
Chloride: 107 mmol/L (ref 98–111)
Creatinine: 1.6 mg/dL — ABNORMAL HIGH (ref 0.44–1.00)
GFR, Estimated: 35 mL/min — ABNORMAL LOW (ref >60)
Glucose, Bld: 160 mg/dL — ABNORMAL HIGH (ref 70–99)
Potassium: 3.8 mmol/L (ref 3.5–5.1)
Sodium: 141 mmol/L (ref 135–145)
Total Bilirubin: 0.4 mg/dL (ref 0.3–1.2)
Total Protein: 7.2 g/dL (ref 6.5–8.1)

## 2021-10-20 LAB — CBC WITH DIFFERENTIAL (CANCER CENTER ONLY)
Abs Immature Granulocytes: 0.01 10*3/uL (ref 0.00–0.07)
Basophils Absolute: 0.1 10*3/uL (ref 0.0–0.1)
Basophils Relative: 1 %
Eosinophils Absolute: 0.5 10*3/uL (ref 0.0–0.5)
Eosinophils Relative: 8 %
HCT: 40 % (ref 36.0–46.0)
Hemoglobin: 12.5 g/dL (ref 12.0–15.0)
Immature Granulocytes: 0 %
Lymphocytes Relative: 18 %
Lymphs Abs: 1.1 10*3/uL (ref 0.7–4.0)
MCH: 25 pg — ABNORMAL LOW (ref 26.0–34.0)
MCHC: 31.3 g/dL (ref 30.0–36.0)
MCV: 79.8 fL — ABNORMAL LOW (ref 80.0–100.0)
Monocytes Absolute: 0.5 10*3/uL (ref 0.1–1.0)
Monocytes Relative: 8 %
Neutro Abs: 4.2 10*3/uL (ref 1.7–7.7)
Neutrophils Relative %: 65 %
Platelet Count: 234 10*3/uL (ref 150–400)
RBC: 5.01 MIL/uL (ref 3.87–5.11)
RDW: 16.2 % — ABNORMAL HIGH (ref 11.5–15.5)
WBC Count: 6.4 10*3/uL (ref 4.0–10.5)
nRBC: 0 % (ref 0.0–0.2)

## 2021-10-20 LAB — TSH: TSH: 2.554 u[IU]/mL (ref 0.350–4.500)

## 2021-10-20 MED ORDER — SODIUM CHLORIDE 0.9 % IV SOLN: Freq: Once | INTRAVENOUS | Status: AC

## 2021-10-20 MED ORDER — SODIUM CHLORIDE 0.9 % IV SOLN
200.0000 mg | Freq: Once | INTRAVENOUS | Status: AC
  Administered 2021-10-20: 200 mg via INTRAVENOUS
  Filled 2021-10-20: qty 200

## 2021-10-20 MED ORDER — HEPARIN SOD (PORK) LOCK FLUSH 100 UNIT/ML IV SOLN
500.0000 [IU] | Freq: Once | INTRAVENOUS | Status: AC | PRN
  Administered 2021-10-20: 500 [IU]

## 2021-10-20 MED ORDER — SODIUM CHLORIDE 0.9% FLUSH
10.0000 mL | INTRAVENOUS | Status: DC | PRN
  Administered 2021-10-20: 10 mL

## 2021-10-20 NOTE — Patient Instructions (Signed)
[facility] ONCOL[facility]  Discharge Instructions: Thank you for choosing [facility] to provide your oncology and hematology care.   If you have a lab appointment with the [facility], please go directly to the [facility] and check in at the registration area.   Wear comfortable clothing and clothing appropriate for easy access to any Portacath or PICC line.   We strive to give you quality time with your provider. You may need to reschedule your appointment if you arrive late (15 or more minutes).  Arriving late affects you and other patients whose appointments are after yours.  Also, if you miss three or more appointments without notifying the office, you may be dismissed from the clinic at the provider's discretion.      For prescription refill requests, have your pharmacy contact our office and allow 72 hours for refills to be completed.    Today you received the following chemotherapy and/or immunotherapy agents: Keytruda    To help prevent nausea and vomiting after your treatment, we encourage you to take your nausea medication as directed.  BELOW ARE SYMPTOMS THAT SHOULD BE REPORTED IMMEDIATELY: *FEVER GREATER THAN 100.4 F (38 C) OR HIGHER *CHILLS OR SWEATING *NAUSEA AND VOMITING THAT IS NOT CONTROLLED WITH YOUR NAUSEA MEDICATION *UNUSUAL SHORTNESS OF BREATH *UNUSUAL BRUISING OR BLEEDING *URINARY PROBLEMS (pain or burning when urinating, or frequent urination) *BOWEL PROBLEMS (unusual diarrhea, constipation, pain near the anus) TENDERNESS IN MOUTH AND THROAT WITH OR WITHOUT PRESENCE OF ULCERS (sore throat, sores in mouth, or a toothache) UNUSUAL RASH, SWELLING OR PAIN  UNUSUAL VAGINAL DISCHARGE OR ITCHING   Items with * indicate a potential emergency and should be followed up as soon as possible or go to the Emergency Department if any problems should occur.  Please show the CHEMOTHERAPY ALERT CARD or IMMUNOTHERAPY ALERT CARD at check-in to the  Emergency Department and triage nurse.  Should you have questions after your visit or need to cancel or reschedule your appointment, please contact [facility]  Dept: [phone]  and follow the prompts.  Office hours are 8:00 a.m. to 4:30 p.m. Monday - Friday. Please note that voicemails left after 4:00 p.m. may not be returned until the following business day.  We are closed weekends and major holidays. You have access to a nurse at all times for urgent questions. Please call the main number to the clinic Dept: [phone] and follow the prompts.   For any non-urgent questions, you may also contact your provider using MyChart. We now offer e-Visits for anyone 35 and older to request care online for non-urgent symptoms. For details visit [email or web address].   Also download the MyChart app! Go to the app store, search "MyChart", open the app, select [facility], and log in with your MyChart username and password.  Masks are optional in the cancer centers. If you would like for your care team to wear a mask while they are taking care of you, please let them know. You may have one support person who is at least 68 years old accompany you for your appointments.

## 2021-10-20 NOTE — Progress Notes (Signed)
[place] [phone]   Fax:(336) [phone]  OFFICE PROGRESS NOTE  [name], [place] [address]  DIAGNOSIS: Stage IV (T1c, N1, M1c ) non-small cell lung cancer, adenocarcinoma diagnosed in July 2020 and presented with left upper lobe lung nodule in addition to right upper lobe lung nodule with left hilar lymphadenopathy as well as multiple brain metastasis.  Biomarker Findings Microsatellite status - MS-Stable Tumor Mutational Burden - 4 Muts/Mb Genomic Findings For a complete list of the genes assayed, please refer to the Appendix. ATM G204* KRAS G12C PDGFRA P122T SMO R199W 7 Disease relevant genes with no reportable alterations: ALK, BRAF, EGFR, ERBB2, MET, RET, ROS1  PDL 1 expression was 1%  PRIOR THERAPY:  1) SRS to multiple brain metastasis under the care of Dr. Isidore Moos. 2) SRS to a 4 mm new brain metastasis under the care of Dr. Isidore Moos and Dr. Saintclair Halsted on 09/02/2020  CURRENT THERAPY: Systemic chemotherapy with carboplatin for AUC of 5, Alimta 500 mg/M2 and Keytruda 200 mg IV every 3 weeks.  First dose October 05, 2018.  Status post 53 cycles.  Starting from cycle #5 the patient will be on treatment with maintenance Alimta 500 mg/M2 and Keytruda 200 mg IV every 3 weeks.  She has been on treatment with single agent Keytruda starting from cycle #9 secondary to renal insufficiency.  INTERVAL HISTORY: Carolyn [name] 66 y.o. female returns to the clinic today for follow-up visit.  The patient is feeling fine today with no concerning complaints except for the mild shortness of breath with exertion.  She denied having any chest pain, cough or hemoptysis.  She denied having any nausea, vomiting, diarrhea or constipation.  She has no headache or visual changes.  She denied having any recent weight loss or night sweats.  She is here today for evaluation before starting cycle #54 of her treatment.   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  MEDICATIONS:  Current Outpatient Medications  Medication Sig Dispense Refill   acetaminophen (TYLENOL) 500 MG tablet Take 1,000 mg by mouth every 8 (eight) hours as needed for mild pain, fever or headache.      amLODipine (NORVASC) 10 MG tablet Take 1 tablet by mouth once daily 90 tablet 0   aspirin EC 81 MG tablet Take 81 mg by mouth daily.     carvedilol (COREG) 25 MG tablet Take 1/2 (one-half) tablet by mouth twice daily 180 tablet 0   Cholecalciferol (VITAMIN D-3) 125 MCG (5000 UT) TABS Take 5,000 Units by mouth daily.  empagliflozin (JARDIANCE) 25 MG TABS tablet Take 1 tablet (25 mg total) by mouth daily before breakfast. 90 tablet 3   esomeprazole (NEXIUM) 20 MG capsule Take 20 mg by mouth daily with breakfast.     ezetimibe (ZETIA) 10 MG tablet TAKE 1 TABLET BY MOUTH ONCE DAILY AFTER SUPPER 90 tablet 0   fexofenadine (ALLEGRA) 180 MG tablet Take 180 mg by mouth daily.     fluticasone (FLONASE) 50 MCG/ACT nasal spray Place 1 spray into both nostrils daily as needed for allergies or rhinitis.     hydroxychloroquine (PLAQUENIL) 200 MG tablet Take 200 mg by mouth daily.     Lancets (ONETOUCH DELICA PLUS LANCET33G) MISC SMARTSIG:1 Topical 3 Times Daily     lidocaine-prilocaine (EMLA) cream  Apply to the Port-A-Cath site 30 to 60 minutes before chemotherapy. 30 g 2   LIVALO 4 MG TABS TAKE 1 TABLET BY MOUTH AT BEDTIME 90 tablet 0   metFORMIN (GLUCOPHAGE) 500 MG tablet Take 500 mg by mouth daily with supper.     methocarbamol (ROBAXIN) 500 MG tablet Take 1 tablet (500 mg total) by mouth every 8 (eight) hours as needed for muscle spasms. 30 tablet 0   montelukast (SINGULAIR) 10 MG tablet Take 10 mg by mouth daily as needed (allergies).     nitroGLYCERIN (NITROSTAT) 0.4 MG SL tablet Place 1 tablet (0.4 mg total) under the tongue every 5 (five) minutes as needed for chest pain. (Patient not taking: Reported on 10/20/2021) 25 tablet 2   ONETOUCH ULTRA test strip USE 1 STRIP TO CHECK GLUCOSE THREE TIMES DAILY     Polyethyl Glycol-Propyl Glycol (SYSTANE) 0.4-0.3 % GEL ophthalmic gel Place 1 application. into both eyes every 6 (six) hours as needed (dry eyes).     telmisartan (MICARDIS) 40 MG tablet Take 40 mg by mouth daily.     vitamin E 180 MG (400 UNITS) capsule Take 400 IU daily x 1 week, then 400 IU BID (Patient taking differently: Take 400 Units by mouth in the morning and at bedtime.) 60 capsule 5   No current facility-administered medications for this visit.    SURGICAL HISTORY:  Past Surgical History:  Procedure Laterality Date   ABDOMINAL HYSTERECTOMY     bone spur     CARDIAC CATHETERIZATION  06/13/2012   carpel tunnel surgery Left    COLONOSCOPY     9 years ago   CORONARY ANGIOPLASTY  06/13/2012   EYE SURGERY     cyst left eye    IR IMAGING GUIDED PORT INSERTION  10/10/2018   LEFT HEART CATHETERIZATION WITH CORONARY ANGIOGRAM N/A 06/13/2012   Procedure: LEFT HEART CATHETERIZATION WITH CORONARY ANGIOGRAM;  Surgeon: Pamella Pert, MD;  Location: [facility] CATH LAB;  Service: Cardiovascular;  Laterality: N/A;   NECK SURGERY     rotator cuff surgery Right 2015   SHOULDER ARTHROSCOPY WITH ROTATOR CUFF REPAIR AND SUBACROMIAL DECOMPRESSION Left 05/14/2021   Procedure: LEFT SHOULDER  ARTHROSCOPY, SUBACROMIAL DECOMPRESSION, BICEPS TENODESIS, MINI OPEN ROTATOR CUFF REPAIR;  Surgeon: Cammy Copa, MD;  Location: [facility] OR;  Service: Orthopedics;  Laterality: Left;   VIDEO BRONCHOSCOPY WITH ENDOBRONCHIAL NAVIGATION N/A 09/06/2018   Procedure: VIDEO BRONCHOSCOPY WITH ENDOBRONCHIAL NAVIGATION, left upper lung;  Surgeon: Corliss Skains, MD;  Location: [facility] OR;  Service: Thoracic;  Laterality: N/A;   VIDEO BRONCHOSCOPY WITH ENDOBRONCHIAL ULTRASOUND Left 09/06/2018   Procedure: VIDEO BRONCHOSCOPY WITH ENDOBRONCHIAL ULTRASOUND, left lung;  Surgeon: Corliss Skains, MD;  Location: [facility] OR;  Service: Thoracic;  Laterality: Left;   WISDOM TOOTH EXTRACTION      REVIEW OF SYSTEMS:  A comprehensive review of systems was negative except for: Respiratory: positive for dyspnea on exertion   PHYSICAL EXAMINATION: General appearance: alert, cooperative, and no distress Head: Normocephalic, without obvious abnormality, atraumatic Neck: no adenopathy, no JVD, supple, symmetrical, trachea midline, and thyroid not enlarged, symmetric, no tenderness/mass/nodules Lymph nodes: Cervical, supraclavicular, and axillary nodes normal. Resp: clear to auscultation bilaterally Back: symmetric, no curvature. ROM normal. No CVA tenderness. Cardio: regular rate and rhythm, S1, S2 normal, no murmur, click, rub or gallop GI: soft, non-tender; bowel sounds normal; no masses,  no organomegaly Extremities: extremities normal, atraumatic, no cyanosis or edema  ECOG PERFORMANCE STATUS: 1 - Symptomatic but completely ambulatory  Blood pressure 138/62, pulse 63, temperature 97.8 F (36.6 C), temperature source Oral, resp. rate 19, height 5\' 1"  (1.549 m), weight 179 lb 14.4 oz (81.6 kg), SpO2 100 %.  LABORATORY DATA: Lab Results  Component Value Date   WBC 6.4 10/20/2021   HGB 12.5 10/20/2021   HCT 40.0 10/20/2021   MCV 79.8 (L) 10/20/2021   PLT 234 10/20/2021      Chemistry      Component Value  Date/Time   NA 140 09/29/2021 1139   NA 139 08/18/2020 0801   K 4.0 09/29/2021 1139   CL 107 09/29/2021 1139   CO2 29 09/29/2021 1139   BUN 13 09/29/2021 1139   BUN 16 08/18/2020 0801   CREATININE 1.42 (H) 09/29/2021 1139      Component Value Date/Time   CALCIUM 9.3 09/29/2021 1139   ALKPHOS 69 09/29/2021 1139   AST 19 09/29/2021 1139   ALT 10 09/29/2021 1139   BILITOT 0.4 09/29/2021 1139       RADIOGRAPHIC STUDIES: No results found.   ASSESSMENT AND PLAN: This is a very pleasant 66 years old African-American female with likely stage IV (T1c, N1, M1C) non-small cell lung cancer, adenocarcinoma with positive KRAS G12C mutation diagnosed in July 2020 and presented with left upper lobe lung nodule in addition to right upper lobe pulmonary nodule as well as left hilar adenopathy and metastatic lesion to the brain. Molecular studies showed no actionable mutation and PDL 1 expression was 1%. The patient underwent SBRT to the metastatic brain lesion under the care of Dr. August 2020. The patient is currently undergoing systemic chemotherapy with carboplatin for AUC of 5, Alimta 500 mg/M2 and Keytruda 200 mg IV every 3 weeks is status post 53 cycles.  Starting from cycle #5 she is on maintenance treatment with Alimta and Keytruda every 3 weeks with only Keytruda on the last few cycles because of the renal insufficiency. The patient has been tolerating this treatment well with no concerning adverse effects. I recommended for her to proceed with cycle #54 today as planned. She will come back for follow-up visit in 3 weeks for evaluation before starting cycle #55. For the renal insufficiency, she is followed by nephrology. The patient was advised to call immediately if she has any other concerning symptoms in the interval. The patient voices understanding of current disease status and treatment options and is in agreement with the current care plan. All questions were answered. The patient knows to  call the clinic with any problems, questions or concerns. We can certainly see the patient much sooner if necessary.  Disclaimer: This note was dictated with voice recognition software. Similar sounding words can inadvertently be transcribed and may not be corrected upon review.

## 2021-10-20 NOTE — Progress Notes (Signed)
Per Dr. Julien Nordmann it is ok to treat pt today with Pembrolizumab  and creatinine of 1.6.

## 2021-10-22 DIAGNOSIS — I10 Essential (primary) hypertension: Secondary | ICD-10-CM | POA: Diagnosis not present

## 2021-10-22 DIAGNOSIS — E119 Type 2 diabetes mellitus without complications: Secondary | ICD-10-CM | POA: Diagnosis not present

## 2021-10-22 DIAGNOSIS — E559 Vitamin D deficiency, unspecified: Secondary | ICD-10-CM | POA: Diagnosis not present

## 2021-10-22 DIAGNOSIS — E7800 Pure hypercholesterolemia, unspecified: Secondary | ICD-10-CM | POA: Diagnosis not present

## 2021-10-22 DIAGNOSIS — C3492 Malignant neoplasm of unspecified part of left bronchus or lung: Secondary | ICD-10-CM | POA: Diagnosis not present

## 2021-10-22 DIAGNOSIS — M542 Cervicalgia: Secondary | ICD-10-CM | POA: Diagnosis not present

## 2021-10-22 DIAGNOSIS — N1831 Chronic kidney disease, stage 3a: Secondary | ICD-10-CM | POA: Diagnosis not present

## 2021-10-22 DIAGNOSIS — C7931 Secondary malignant neoplasm of brain: Secondary | ICD-10-CM | POA: Diagnosis not present

## 2021-10-22 DIAGNOSIS — I2510 Atherosclerotic heart disease of native coronary artery without angina pectoris: Secondary | ICD-10-CM | POA: Diagnosis not present

## 2021-10-22 DIAGNOSIS — E611 Iron deficiency: Secondary | ICD-10-CM | POA: Diagnosis not present

## 2021-11-02 DIAGNOSIS — C3492 Malignant neoplasm of unspecified part of left bronchus or lung: Secondary | ICD-10-CM | POA: Diagnosis not present

## 2021-11-02 DIAGNOSIS — N611 Abscess of the breast and nipple: Secondary | ICD-10-CM | POA: Diagnosis not present

## 2021-11-02 DIAGNOSIS — N6489 Other specified disorders of breast: Secondary | ICD-10-CM | POA: Diagnosis not present

## 2021-11-05 DIAGNOSIS — M65332 Trigger finger, left middle finger: Secondary | ICD-10-CM | POA: Diagnosis not present

## 2021-11-05 DIAGNOSIS — M65341 Trigger finger, right ring finger: Secondary | ICD-10-CM | POA: Diagnosis not present

## 2021-11-05 DIAGNOSIS — M79642 Pain in left hand: Secondary | ICD-10-CM | POA: Diagnosis not present

## 2021-11-05 DIAGNOSIS — M79641 Pain in right hand: Secondary | ICD-10-CM | POA: Diagnosis not present

## 2021-11-05 DIAGNOSIS — M65331 Trigger finger, right middle finger: Secondary | ICD-10-CM | POA: Diagnosis not present

## 2021-11-05 DIAGNOSIS — M654 Radial styloid tenosynovitis [de Quervain]: Secondary | ICD-10-CM | POA: Diagnosis not present

## 2021-11-10 ENCOUNTER — Inpatient Hospital Stay: Payer: Medicare Other

## 2021-11-10 ENCOUNTER — Encounter: Payer: Self-pay | Admitting: Internal Medicine

## 2021-11-10 ENCOUNTER — Other Ambulatory Visit: Payer: Self-pay

## 2021-11-10 ENCOUNTER — Inpatient Hospital Stay (HOSPITAL_BASED_OUTPATIENT_CLINIC_OR_DEPARTMENT_OTHER): Payer: Medicare Other | Admitting: Internal Medicine

## 2021-11-10 ENCOUNTER — Inpatient Hospital Stay: Payer: Medicare Other | Attending: Physician Assistant

## 2021-11-10 VITALS — BP 121/70 | HR 67 | Resp 18

## 2021-11-10 VITALS — BP 130/67 | HR 60 | Temp 98.6°F | Resp 16 | Wt 180.6 lb

## 2021-11-10 DIAGNOSIS — C7931 Secondary malignant neoplasm of brain: Secondary | ICD-10-CM | POA: Diagnosis not present

## 2021-11-10 DIAGNOSIS — C3492 Malignant neoplasm of unspecified part of left bronchus or lung: Secondary | ICD-10-CM

## 2021-11-10 DIAGNOSIS — Z5112 Encounter for antineoplastic immunotherapy: Secondary | ICD-10-CM | POA: Diagnosis not present

## 2021-11-10 DIAGNOSIS — C3412 Malignant neoplasm of upper lobe, left bronchus or lung: Secondary | ICD-10-CM | POA: Diagnosis not present

## 2021-11-10 DIAGNOSIS — Z79899 Other long term (current) drug therapy: Secondary | ICD-10-CM | POA: Diagnosis not present

## 2021-11-10 DIAGNOSIS — Z95828 Presence of other vascular implants and grafts: Secondary | ICD-10-CM

## 2021-11-10 DIAGNOSIS — R5383 Other fatigue: Secondary | ICD-10-CM

## 2021-11-10 DIAGNOSIS — C3490 Malignant neoplasm of unspecified part of unspecified bronchus or lung: Secondary | ICD-10-CM | POA: Diagnosis not present

## 2021-11-10 LAB — CMP (CANCER CENTER ONLY)
ALT: 13 U/L (ref 0–44)
AST: 22 U/L (ref 15–41)
Albumin: 3.9 g/dL (ref 3.5–5.0)
Alkaline Phosphatase: 84 U/L (ref 38–126)
Anion gap: 4 — ABNORMAL LOW (ref 5–15)
BUN: 15 mg/dL (ref 8–23)
CO2: 28 mmol/L (ref 22–32)
Calcium: 9 mg/dL (ref 8.9–10.3)
Chloride: 107 mmol/L (ref 98–111)
Creatinine: 1.49 mg/dL — ABNORMAL HIGH (ref 0.44–1.00)
GFR, Estimated: 38 mL/min — ABNORMAL LOW (ref >60)
Glucose, Bld: 150 mg/dL — ABNORMAL HIGH (ref 70–99)
Potassium: 4.2 mmol/L (ref 3.5–5.1)
Sodium: 139 mmol/L (ref 135–145)
Total Bilirubin: 0.3 mg/dL (ref 0.3–1.2)
Total Protein: 6.7 g/dL (ref 6.5–8.1)

## 2021-11-10 LAB — TSH: TSH: 2.856 u[IU]/mL (ref 0.350–4.500)

## 2021-11-10 LAB — CBC WITH DIFFERENTIAL (CANCER CENTER ONLY)
Abs Immature Granulocytes: 0.01 10*3/uL (ref 0.00–0.07)
Basophils Absolute: 0.1 10*3/uL (ref 0.0–0.1)
Basophils Relative: 1 %
Eosinophils Absolute: 0.5 10*3/uL (ref 0.0–0.5)
Eosinophils Relative: 10 %
HCT: 40.1 % (ref 36.0–46.0)
Hemoglobin: 12.6 g/dL (ref 12.0–15.0)
Immature Granulocytes: 0 %
Lymphocytes Relative: 17 %
Lymphs Abs: 0.9 10*3/uL (ref 0.7–4.0)
MCH: 25 pg — ABNORMAL LOW (ref 26.0–34.0)
MCHC: 31.4 g/dL (ref 30.0–36.0)
MCV: 79.6 fL — ABNORMAL LOW (ref 80.0–100.0)
Monocytes Absolute: 0.7 10*3/uL (ref 0.1–1.0)
Monocytes Relative: 13 %
Neutro Abs: 3 10*3/uL (ref 1.7–7.7)
Neutrophils Relative %: 59 %
Platelet Count: 239 10*3/uL (ref 150–400)
RBC: 5.04 MIL/uL (ref 3.87–5.11)
RDW: 16 % — ABNORMAL HIGH (ref 11.5–15.5)
WBC Count: 5.2 10*3/uL (ref 4.0–10.5)
nRBC: 0 % (ref 0.0–0.2)

## 2021-11-10 MED ORDER — HEPARIN SOD (PORK) LOCK FLUSH 100 UNIT/ML IV SOLN
500.0000 [IU] | Freq: Once | INTRAVENOUS | Status: AC | PRN
  Administered 2021-11-10: 500 [IU]

## 2021-11-10 MED ORDER — SODIUM CHLORIDE 0.9% FLUSH
10.0000 mL | INTRAVENOUS | Status: DC | PRN
  Administered 2021-11-10: 10 mL

## 2021-11-10 MED ORDER — SODIUM CHLORIDE 0.9 % IV SOLN
200.0000 mg | Freq: Once | INTRAVENOUS | Status: AC
  Administered 2021-11-10: 200 mg via INTRAVENOUS
  Filled 2021-11-10: qty 200

## 2021-11-10 MED ORDER — SODIUM CHLORIDE 0.9 % IV SOLN: Freq: Once | INTRAVENOUS | Status: AC

## 2021-11-10 NOTE — Progress Notes (Signed)
[place] [phone]   Fax:([phone]  OFFICE PROGRESS NOTE  [name], [place] [address]  DIAGNOSIS: Stage IV (T1c, N1, M1c ) non-small cell lung cancer, adenocarcinoma diagnosed in July 2020 and presented with left upper lobe lung nodule in addition to right upper lobe lung nodule with left hilar lymphadenopathy as well as multiple brain metastasis.  Biomarker Findings Microsatellite status - MS-Stable Tumor Mutational Burden - 4 Muts/Mb Genomic Findings For a complete list of the genes assayed, please refer to the Appendix. ATM G204* KRAS G12C PDGFRA P122T SMO R199W 7 Disease relevant genes with no reportable alterations: ALK, BRAF, EGFR, ERBB2, MET, RET, ROS1  PDL 1 expression was 1%  PRIOR THERAPY:  1) SRS to multiple brain metastasis under the care of Dr. Isidore Moos. 2) SRS to a 4 mm new brain metastasis under the care of Dr. Isidore Moos and Dr. Saintclair Halsted on 09/02/2020  CURRENT THERAPY: Systemic chemotherapy with carboplatin for AUC of 5, Alimta 500 mg/M2 and Keytruda 200 mg IV every 3 weeks.  First dose October 05, 2018.  Status post 54 cycles.  Starting from cycle #5 the patient will be on treatment with maintenance Alimta 500 mg/M2 and Keytruda 200 mg IV every 3 weeks.  She has been on treatment with single agent Keytruda starting from cycle #9 secondary to renal insufficiency.  INTERVAL HISTORY: Carolyn [name] 66 y.o. female returns to the clinic today for follow-up visit.  The patient is feeling fine today with no concerning complaints except for subcutaneous nodule under her left breast that was treated with doxycycline by her primary care physician and improved.  She also has some pain in the right wrist and she was seen by hand surgeon and expected to have surgical intervention in few weeks.  She has no chest pain but has shortness of breath with exertion with no cough or hemoptysis.  She has no  nausea, vomiting, diarrhea or constipation.  She has no headache or visual changes.  She is here today for evaluation before restarting cycle #55.   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  MEDICATIONS:  Current Outpatient Medications  Medication Sig Dispense Refill   acetaminophen (TYLENOL) 500 MG tablet Take 1,000 mg by mouth every 8 (eight) hours as needed for mild pain, fever or headache.      amLODipine (NORVASC) 10 MG tablet Take 1 tablet by mouth once daily 90 tablet 0   aspirin EC 81 MG tablet Take 81 mg by mouth daily.     carvedilol (COREG)  25 MG tablet Take 1/2 (one-half) tablet by mouth twice daily 180 tablet 0   Cholecalciferol (VITAMIN D-3) 125 MCG (5000 UT) TABS Take 5,000 Units by mouth daily.     empagliflozin (JARDIANCE) 25 MG TABS tablet Take 1 tablet (25 mg total) by mouth daily before breakfast. 90 tablet 3   esomeprazole (NEXIUM) 20 MG capsule Take 20 mg by mouth daily with breakfast.     ezetimibe (ZETIA) 10 MG tablet TAKE 1 TABLET BY MOUTH ONCE DAILY AFTER SUPPER 90 tablet 0   fexofenadine (ALLEGRA) 180 MG tablet Take 180 mg by mouth daily.     fluticasone (FLONASE) 50 MCG/ACT nasal spray Place 1 spray into both nostrils daily as needed for allergies or rhinitis.     hydroxychloroquine (PLAQUENIL) 200  MG tablet Take 200 mg by mouth daily.     Lancets (ONETOUCH DELICA PLUS LANCET33G) MISC SMARTSIG:1 Topical 3 Times Daily     lidocaine-prilocaine (EMLA) cream Apply to the Port-A-Cath site 30 to 60 minutes before chemotherapy. 30 g 2   LIVALO 4 MG TABS TAKE 1 TABLET BY MOUTH AT BEDTIME 90 tablet 0   metFORMIN (GLUCOPHAGE) 500 MG tablet Take 500 mg by mouth daily with supper.     methocarbamol (ROBAXIN) 500 MG tablet Take 1 tablet (500 mg total) by mouth every 8 (eight) hours as needed for muscle spasms. 30 tablet 0   montelukast (SINGULAIR) 10 MG tablet Take 10 mg by mouth daily as needed (allergies).     nitroGLYCERIN (NITROSTAT) 0.4 MG SL tablet Place 1 tablet (0.4 mg total) under the tongue every 5 (five) minutes as needed for chest pain. (Patient not taking: Reported on 10/20/2021) 25 tablet 2   ONETOUCH ULTRA test strip USE 1 STRIP TO CHECK GLUCOSE THREE TIMES DAILY     Polyethyl Glycol-Propyl Glycol (SYSTANE) 0.4-0.3 % GEL ophthalmic gel Place 1 application. into both eyes every 6 (six) hours as needed (dry eyes).     telmisartan (MICARDIS) 40 MG tablet Take 40 mg by mouth daily.     vitamin E 180 MG (400 UNITS) capsule Take 400 IU daily x 1 week, then 400 IU BID (Patient taking differently: Take 400 Units by mouth in the morning and at bedtime.) 60 capsule 5   No current facility-administered medications for this visit.   Facility-Administered Medications Ordered in Other Visits  Medication Dose Route Frequency Provider Last Rate Last Admin   sodium chloride flush (NS) 0.9 % injection 10 mL  10 mL Intracatheter PRN Si Gaul, MD   10 mL at 11/10/21 0817    SURGICAL HISTORY:  Past Surgical History:  Procedure Laterality Date   ABDOMINAL HYSTERECTOMY     bone spur     CARDIAC CATHETERIZATION  06/13/2012   carpel tunnel surgery Left    COLONOSCOPY     9 years ago   CORONARY ANGIOPLASTY  06/13/2012   EYE SURGERY     cyst left eye    IR IMAGING GUIDED PORT INSERTION  10/10/2018    LEFT HEART CATHETERIZATION WITH CORONARY ANGIOGRAM N/A 06/13/2012   Procedure: LEFT HEART CATHETERIZATION WITH CORONARY ANGIOGRAM;  Surgeon: Pamella Pert, MD;  Location: [facility] CATH LAB;  Service: Cardiovascular;  Laterality: N/A;   NECK SURGERY     rotator cuff surgery Right 2015   SHOULDER ARTHROSCOPY WITH ROTATOR CUFF REPAIR AND SUBACROMIAL DECOMPRESSION Left 05/14/2021   Procedure: LEFT SHOULDER ARTHROSCOPY, SUBACROMIAL DECOMPRESSION, BICEPS TENODESIS, MINI OPEN ROTATOR CUFF REPAIR;  Surgeon: Cammy Copa, MD;  Location: [facility];  Service: Orthopedics;  Laterality: Left;   VIDEO BRONCHOSCOPY WITH ENDOBRONCHIAL NAVIGATION N/A 09/06/2018   Procedure: VIDEO BRONCHOSCOPY WITH ENDOBRONCHIAL NAVIGATION, left upper lung;  Surgeon: Lajuana Matte, MD;  Location: [facility];  Service: Thoracic;  Laterality: N/A;   VIDEO BRONCHOSCOPY WITH ENDOBRONCHIAL ULTRASOUND Left 09/06/2018   Procedure: VIDEO BRONCHOSCOPY WITH ENDOBRONCHIAL ULTRASOUND, left lung;  Surgeon: Lajuana Matte, MD;  Location: [facility];  Service: Thoracic;  Laterality: Left;   WISDOM TOOTH EXTRACTION      REVIEW OF SYSTEMS:  A comprehensive review of systems was negative except for: Respiratory: positive for dyspnea on exertion   PHYSICAL EXAMINATION: General appearance: alert, cooperative, and no distress Head: Normocephalic, without obvious abnormality, atraumatic Neck: no adenopathy, no JVD, supple, symmetrical, trachea midline, and thyroid not enlarged, symmetric, no tenderness/mass/nodules Lymph nodes: Cervical, supraclavicular, and axillary nodes normal. Resp: clear to auscultation bilaterally Back: symmetric, no curvature. ROM normal. No CVA tenderness. Cardio: regular rate and rhythm, S1, S2 normal, no murmur, click, rub or gallop GI: soft, non-tender; bowel sounds normal; no masses,  no organomegaly Extremities: extremities normal, atraumatic, no cyanosis or edema  ECOG PERFORMANCE STATUS: 1 - Symptomatic but completely  ambulatory  Blood pressure 130/67, pulse 60, temperature 98.6 F (37 C), temperature source Oral, resp. rate 16, weight 180 lb 9 oz (81.9 kg), SpO2 100 %.  LABORATORY DATA: Lab Results  Component Value Date   WBC 6.4 10/20/2021   HGB 12.5 10/20/2021   HCT 40.0 10/20/2021   MCV 79.8 (L) 10/20/2021   PLT 234 10/20/2021      Chemistry      Component Value Date/Time   NA 141 10/20/2021 0850   NA 139 08/18/2020 0801   K 3.8 10/20/2021 0850   CL 107 10/20/2021 0850   CO2 27 10/20/2021 0850   BUN 16 10/20/2021 0850   BUN 16 08/18/2020 0801   CREATININE 1.60 (H) 10/20/2021 0850      Component Value Date/Time   CALCIUM 9.5 10/20/2021 0850   ALKPHOS 78 10/20/2021 0850   AST 18 10/20/2021 0850   ALT 10 10/20/2021 0850   BILITOT 0.4 10/20/2021 0850       RADIOGRAPHIC STUDIES: No results found.   ASSESSMENT AND PLAN: This is a very pleasant 66 years old African-American female with likely stage IV (T1c, N1, M1C) non-small cell lung cancer, adenocarcinoma with positive KRAS G12C mutation diagnosed in July 2020 and presented with left upper lobe lung nodule in addition to right upper lobe pulmonary nodule as well as left hilar adenopathy and metastatic lesion to the brain. Molecular studies showed no actionable mutation and PDL 1 expression was 1%. The patient underwent SBRT to the metastatic brain lesion under the care of Dr. Isidore Moos. The patient is currently undergoing systemic chemotherapy with carboplatin for AUC of 5, Alimta 500 mg/M2 and Keytruda 200 mg IV every 3 weeks is status post 54 cycles.  Starting from cycle #5 she is on maintenance treatment with Alimta and Keytruda every 3 weeks with only Keytruda on the last few cycles because of the renal insufficiency. The patient has been tolerating her treatment well with no concerning adverse effects. I recommended for her to proceed with cycle #55 today as planned. I will see her back for follow-up visit in 3 weeks for evaluation  with repeat CT scan of the chest, abdomen and pelvis for restaging of her disease. For the renal insufficiency, she is followed by nephrology. If there is any contradiction  with the dates of her surgery and the next treatment, we will be happy to delay her treatment by 1-2 weeks until the surgery and recovery is done. The patient was advised to call immediately if she has any other concerning symptoms in the interval. The patient voices understanding of current disease status and treatment options and is in agreement with the current care plan. All questions were answered. The patient knows to call the clinic with any problems, questions or concerns. We can certainly see the patient much sooner if necessary.  Disclaimer: This note was dictated with voice recognition software. Similar sounding words can inadvertently be transcribed and may not be corrected upon review.

## 2021-11-10 NOTE — Progress Notes (Signed)
Per Dr. Julien Nordmann ,it is okay to treat pt today with Pembrolizumab and creatinine of

## 2021-11-10 NOTE — Patient Instructions (Signed)
[facility]  Discharge Instructions: Thank you for choosing [facility] to provide your oncology and hematology care.   If you have a lab appointment with the [facility], please go directly to the [facility] and check in at the registration area.   Wear comfortable clothing and clothing appropriate for easy access to any Portacath or PICC line.   We strive to give you quality time with your provider. You may need to reschedule your appointment if you arrive late (15 or more minutes).  Arriving late affects you and other patients whose appointments are after yours.  Also, if you miss three or more appointments without notifying the office, you may be dismissed from the clinic at the provider's discretion.      For prescription refill requests, have your pharmacy contact our office and allow 72 hours for refills to be completed.    Today you received the following chemotherapy and/or immunotherapy agents: Keytruda.       To help prevent nausea and vomiting after your treatment, we encourage you to take your nausea medication as directed.  BELOW ARE SYMPTOMS THAT SHOULD BE REPORTED IMMEDIATELY: *FEVER GREATER THAN 100.4 F (38 C) OR HIGHER *CHILLS OR SWEATING *NAUSEA AND VOMITING THAT IS NOT CONTROLLED WITH YOUR NAUSEA MEDICATION *UNUSUAL SHORTNESS OF BREATH *UNUSUAL BRUISING OR BLEEDING *URINARY PROBLEMS (pain or burning when urinating, or frequent urination) *BOWEL PROBLEMS (unusual diarrhea, constipation, pain near the anus) TENDERNESS IN MOUTH AND THROAT WITH OR WITHOUT PRESENCE OF ULCERS (sore throat, sores in mouth, or a toothache) UNUSUAL RASH, SWELLING OR PAIN  UNUSUAL VAGINAL DISCHARGE OR ITCHING   Items with * indicate a potential emergency and should be followed up as soon as possible or go to the Emergency Department if any problems should occur.  Please show the CHEMOTHERAPY ALERT CARD or IMMUNOTHERAPY ALERT CARD at check-in to  the Emergency Department and triage nurse.  Should you have questions after your visit or need to cancel or reschedule your appointment, please contact [facility]  Dept: [phone]  and follow the prompts.  Office hours are 8:00 a.m. to 4:30 p.m. Monday - Friday. Please note that voicemails left after 4:00 p.m. may not be returned until the following business day.  We are closed weekends and major holidays. You have access to a nurse at all times for urgent questions. Please call the main number to the clinic Dept: [phone] and follow the prompts.   For any non-urgent questions, you may also contact your provider using MyChart. We now offer e-Visits for anyone 34 and older to request care online for non-urgent symptoms. For details visit [email or web address].   Also download the MyChart app! Go to the app store, search "MyChart", open the app, select [facility], and log in with your MyChart username and password.  Masks are optional in the cancer centers. If you would like for your care team to wear a mask while they are taking care of you, please let them know. You may have one support person who is at least 68 years old accompany you for your appointments.

## 2021-11-12 DIAGNOSIS — Z23 Encounter for immunization: Secondary | ICD-10-CM | POA: Diagnosis not present

## 2021-11-12 DIAGNOSIS — N611 Abscess of the breast and nipple: Secondary | ICD-10-CM | POA: Diagnosis not present

## 2021-11-12 DIAGNOSIS — N6489 Other specified disorders of breast: Secondary | ICD-10-CM | POA: Diagnosis not present

## 2021-11-13 DIAGNOSIS — N6002 Solitary cyst of left breast: Secondary | ICD-10-CM | POA: Diagnosis not present

## 2021-11-13 DIAGNOSIS — R92313 Mammographic fatty tissue density, bilateral breasts: Secondary | ICD-10-CM | POA: Diagnosis not present

## 2021-11-18 ENCOUNTER — Encounter: Payer: Self-pay | Admitting: Cardiology

## 2021-11-19 ENCOUNTER — Ambulatory Visit
Admission: RE | Admit: 2021-11-19 | Discharge: 2021-11-19 | Disposition: A | Discharge status: Home / Self Care | Payer: Medicare Other | Source: Ambulatory Visit | Attending: Radiation Oncology | Admitting: Radiation Oncology

## 2021-11-19 DIAGNOSIS — C7931 Secondary malignant neoplasm of brain: Secondary | ICD-10-CM

## 2021-11-19 MED ORDER — GADOPICLENOL 0.5 MMOL/ML IV SOLN
8.0000 mL | Freq: Once | INTRAVENOUS | Status: AC | PRN
  Administered 2021-11-19: 8 mL via INTRAVENOUS

## 2021-11-20 ENCOUNTER — Telehealth: Payer: Self-pay | Admitting: Internal Medicine

## 2021-11-20 NOTE — Telephone Encounter (Signed)
Called patient regarding upcoming 10/03 work-queue, informed patient regarding upcoming October, November and December appointments. Left a voicemail.

## 2021-11-24 ENCOUNTER — Encounter: Payer: Self-pay | Admitting: Radiation Oncology

## 2021-11-24 ENCOUNTER — Ambulatory Visit
Admission: RE | Admit: 2021-11-24 | Discharge: 2021-11-24 | Disposition: A | Discharge status: Home / Self Care | Payer: Medicare Other | Source: Ambulatory Visit | Attending: Radiation Oncology | Admitting: Radiation Oncology

## 2021-11-24 VITALS — BP 125/67 | HR 66 | Temp 97.6°F | Resp 20 | Ht 61.0 in | Wt 184.8 lb

## 2021-11-24 DIAGNOSIS — Z79899 Other long term (current) drug therapy: Secondary | ICD-10-CM | POA: Diagnosis not present

## 2021-11-24 DIAGNOSIS — C7931 Secondary malignant neoplasm of brain: Secondary | ICD-10-CM | POA: Insufficient documentation

## 2021-11-24 DIAGNOSIS — C3412 Malignant neoplasm of upper lobe, left bronchus or lung: Secondary | ICD-10-CM | POA: Diagnosis not present

## 2021-11-24 DIAGNOSIS — Z7984 Long term (current) use of oral hypoglycemic drugs: Secondary | ICD-10-CM | POA: Diagnosis not present

## 2021-11-24 DIAGNOSIS — R609 Edema, unspecified: Secondary | ICD-10-CM | POA: Insufficient documentation

## 2021-11-24 NOTE — Progress Notes (Signed)
Radiation Oncology         [phone] ________________________________  Name: REGLA FITZGIBBON MRN: [id number]  Date: 11/24/2021  DOB: October 07, 1955  Follow-Up Visit Note  Outpatient  CC: [name], FNP  Curt Bears, MD  Diagnosis and Prior Radiotherapy:    ICD-10-CM   1. Metastasis to brain [facility])  C79.31     2. Malignant neoplasm metastatic to brain [facility])  C79.31        CHIEF COMPLAINT: Here for follow-up and surveillance of brain cancer   Radiation Treatment Dates: 09/22/2018 through 09/22/2018 Site Technique Total Dose Dose per Fx Completed Fx Beam Energies  Brain: Brain_SRS IMRT 20/20 20 1/1 6XFFF  Narrative: The patient tolerated radiation therapy relatively well. [name] received stereotactic radiosurgery to the following targets: ExacTrac, 6 vmat beams max dose = 127.8% PTV1 Ant Rt Frontal 37mm PTV2 Lt Frontal 68mm PTV3 Rt Frontal 11mm PTV4 Rt Parietal 16mm PTV5 Lt Parietal 17mm PTV6 Lt Parietal 59mm PTV7 Lt Temporal 16mm PTV8 Post Rt Frontal 52mm PTV9 Lt Sup Temporal 66mm   Radiation Treatment Dates: 09/02/2020 through 09/02/2020 Site Technique Total Dose (Gy) Dose per Fx (Gy) Completed Fx Beam Energies  Brain: Brain_SRS 3D 20/20 20 1/1 6XFFF     Radiation Treatment Dates: 12/16/2020 through 12/16/2020  Site Technique Total Dose (Gy) Dose per Fx (Gy) Completed Fx Beam Energies  Brain: Brain_SRS 3D 20/20 20 1/1 6XFFF     Narrative:    Mrs. Ihrig presents today for follow-up after completing SRS treatments x 3 occasions.  Dose of Decadron, if applicable: Not currently prescribed  Recent neurologic symptoms, if any:  Seizures: None Headaches: Denies Nausea:  Wt Readings from Last 3 Encounters:  11/24/21 184 lb 12.8 oz (83.8 kg)  11/10/21 180 lb 9 oz (81.9 kg)  10/20/21 179 lb 14.4 oz (81.6 kg)   Dizziness/ataxia: Denies Difficulty with hand coordination: Only related to pain from carpal tunnel Focal numbness/weakness:  Denies Visual deficits/changes: Denies Confusion/Memory deficits: Denies  Additional Complaints / other details: Scheduled for CT A/P without contrast on 11/30/2021, and follow-up with her medical oncology team on 12/01/2021. Also scheduled for carpal tunnel surgery on 12/02/2021.    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  Meds: Current Outpatient Medications  Medication Sig Dispense Refill   acetaminophen (TYLENOL) 500 MG tablet Take 1,000 mg by mouth every 8 (eight) hours as needed for mild pain, fever or headache.      amLODipine (NORVASC) 10 MG tablet Take 1 tablet by mouth once daily 90 tablet 0   aspirin EC 81 MG tablet Take 81 mg by mouth daily.     carvedilol (COREG) 25 MG tablet Take 1/2 (one-half) tablet by mouth twice daily 180 tablet 0   Cholecalciferol (VITAMIN D-3) 125 MCG (5000 UT) TABS Take 5,000 Units by mouth daily.     doxycycline (VIBRAMYCIN) 100 MG capsule Take 100 mg by mouth 2 (two) times daily.     empagliflozin (JARDIANCE) 25 MG TABS tablet Take 1 tablet (25 mg total) by mouth daily before breakfast. 90 tablet 3   esomeprazole (NEXIUM) 20 MG capsule Take 20 mg by mouth daily with breakfast.     ezetimibe (ZETIA) 10 MG tablet TAKE 1 TABLET BY MOUTH ONCE DAILY AFTER SUPPER 90 tablet 0  fexofenadine (ALLEGRA) 180 MG tablet Take 180 mg by mouth daily.     fluticasone (FLONASE) 50 MCG/ACT nasal spray Place 1 spray into both nostrils daily as needed for allergies or rhinitis.     hydroxychloroquine (PLAQUENIL) 200 MG tablet Take 200 mg by mouth daily.     Lancets (ONETOUCH DELICA PLUS [id number]) MISC SMARTSIG:1 Topical 3 Times Daily     lidocaine-prilocaine (EMLA) cream Apply to the Port-A-Cath site 30 to 60 minutes before chemotherapy. 30 g 2   LIVALO 4 MG TABS TAKE 1 TABLET BY MOUTH AT BEDTIME 90 tablet 0    metFORMIN (GLUCOPHAGE) 500 MG tablet Take 500 mg by mouth daily with supper.     methocarbamol (ROBAXIN) 500 MG tablet Take 1 tablet (500 mg total) by mouth every 8 (eight) hours as needed for muscle spasms. 30 tablet 0   montelukast (SINGULAIR) 10 MG tablet Take 10 mg by mouth daily as needed (allergies).     nitroGLYCERIN (NITROSTAT) 0.4 MG SL tablet Place 1 tablet (0.4 mg total) under the tongue every 5 (five) minutes as needed for chest pain. (Patient not taking: Reported on 10/20/2021) 25 tablet 2   ONETOUCH ULTRA test strip USE 1 STRIP TO CHECK GLUCOSE THREE TIMES DAILY     Polyethyl Glycol-Propyl Glycol (SYSTANE) 0.4-0.3 % GEL ophthalmic gel Place 1 application. into both eyes every 6 (six) hours as needed (dry eyes).     telmisartan (MICARDIS) 40 MG tablet Take 40 mg by mouth daily.     vitamin E 180 MG (400 UNITS) capsule Take 400 IU daily x 1 week, then 400 IU BID (Patient taking differently: Take 400 Units by mouth in the morning and at bedtime.) 60 capsule 5   No current facility-administered medications for this encounter.    Physical Findings: The patient is in no acute distress. Patient is alert and oriented.  height is 5\' 1"  (1.549 m) and weight is 184 lb 12.8 oz (83.8 kg). Her temperature is 97.6 F (36.4 C). Her blood pressure is 125/67 and her pulse is 66. Her respiration is 20 and oxygen saturation is 100%. .    General: Alert and oriented, in no acute distress HEENT: Head is normocephalic. Extraocular movements are intact. Oropharynx is clear. Neurologic: Cranial nerves II through XII are grossly intact. No obvious focalities. Speech is fluent. Coordination is intact. No numbness. Psychiatric: Judgment and insight are intact. Affect is appropriate. MSK: strength is symmetric  KPS = 100  100 - Normal; no complaints; no evidence of disease. 90   - Able to carry on normal activity; minor signs or symptoms of disease. 80   - Normal activity with effort; some signs or  symptoms of disease. 49   - Cares for self; unable to carry on normal activity or to do active work. 60   - Requires occasional assistance, but is able to care for most of his personal needs. 50   - Requires considerable assistance and frequent medical care. 38   - Disabled; requires special care and assistance. 57   - Severely disabled; hospital admission is indicated although death not imminent. 53   - Very sick; hospital admission necessary; active supportive treatment necessary. 10   - Moribund; fatal processes progressing rapidly. 0     - Dead  Karnofsky DA, Abelmann WH, Craver LS and Burchenal [facility] [phone]) The use of the nitrogen mustards in the palliative treatment of carcinoma: with particular reference to bronchogenic carcinoma Cancer 1 634-56  Lab Findings: Lab  Results  Component Value Date   WBC 5.2 11/10/2021   HGB 12.6 11/10/2021   HCT 40.1 11/10/2021   MCV 79.6 (L) 11/10/2021   PLT 239 11/10/2021    Radiographic Findings: MR Brain W Wo Contrast  Result Date: 11/20/2021 CLINICAL DATA:  Follow-up metastatic disease EXAM: MRI HEAD WITHOUT AND WITH CONTRAST TECHNIQUE: Multiplanar, multiecho pulse sequences of the brain and surrounding structures were obtained without and with intravenous contrast. CONTRAST:  8 cc view way COMPARISON:  09/18/2021.  06/18/2021. FINDINGS: Brain: Continued decrease in enhancement at the site of a treated lesion of the left anterior temporal lobe, with further decrease in edema or gliotic signal in the region. No worsening or progressive finding. Enlargement of a region of enhancement at a treated lesion of the right frontal lobe, measuring 8 mm in size maximal dimension compared with 6 mm on the previous study. Increased regional edema. This could represent tumor growth or treatment effect. 2 mm focus of enhancement adjacent to the atrium of the left lateral ventricle is redemonstrated. This was not present on the study of 03/19/2021 but has been seen  subsequently and is enlarging very slowly, though [place] suspicious for a metastatic focus. Punctate enhancement at the inferior left cerebellum is unchanged and stable. No other new or suspicious finding. No hydrocephalus. No sign of ischemic infarction. Enhancement at the genu of the left facial nerve as seen previously most consistent with a small incidental schwannoma. Vascular: Major vessels at the base of the brain show flow. Skull and upper cervical spine: Negative Sinuses/Orbits: Clear/normal Other: None IMPRESSION: 1. Continued decrease in enhancement at the site of a treated lesion of the left anterior temporal lobe, with further decrease in edema or gliotic signal in the region. 2. Enlargement of a region of enhancement at a treated lesion of the right frontal lobe, measuring 8 mm in maximal dimension compared with 6 mm on the previous study. Increased regional edema. This could represent tumor growth or treatment effect. 3. 2 mm focus of enhancement adjacent to the atrium of the left lateral ventricle is redemonstrated. This was not present on the study of 03/19/2021 but has been seen subsequently and is suspicious for a metastatic focus. 4. Punctate enhancement at the inferior left cerebellum is unchanged over the last several exams. 5. Enhancement at the genu of the left facial nerve as seen previously, most consistent with a small incidental schwannoma. Electronically Signed   By: Nelson Chimes M.D.   On: 11/20/2021 13:14    Impression/Plan: She is doing well after radiosurgery to the brain.  She has been discussed at our CNS tumor board as of yesterday. Continue Vit E and pentoxifylline to reduce inflammation as it appears that she continues to have treatment effect in the right frontal lobe.  With the help of physics, I reviewed a fusion of all of her past SRS targets and her current MRI today.  She has no clear evidence of new/progressive neoplastic disease in the brain though it should be noted  that the 2 mm lesion adjacent to the atrium of the left lateral ventricle is potentially just adjacent to a previous target.  We will continue to watch this closely.  She will continue to follow-up with medical oncology and we will arrange an MRI of her brain in 3 months prior to neurosurgery follow-up.    On date of service, in total, I spent 30 minutes on this encounter. Patient was seen in person.   _____________________________________   Judson Roch  Isidore Moos, MD

## 2021-11-24 NOTE — Progress Notes (Signed)
Carolyn [name] presents today for follow-up after completing [place] treatments on 12/16/2020 and 09/02/2020, and to review brain MRI results from 11/19/2021  Dose of Decadron, if applicable: Not currently prescribed  Recent neurologic symptoms, if any:  Seizures: None Headaches: Denies Nausea:  Wt Readings from Last 3 Encounters:  11/24/21 184 lb 12.8 oz (83.8 kg)  11/10/21 180 lb 9 oz (81.9 kg)  10/20/21 179 lb 14.4 oz (81.6 kg)   Dizziness/ataxia: Denies Difficulty with hand coordination: Only related to pain from carpal tunnel Focal numbness/weakness: Denies Visual deficits/changes: Denies Confusion/Memory deficits: Denies  Additional Complaints / other details: Scheduled for CT A/P without contrast on 11/30/2021, and follow-up with her medical oncology team on 12/01/2021. Also scheduled for carpal tunnel surgery on 12/02/2021.

## 2021-11-27 NOTE — Progress Notes (Unsigned)
[facility] PROGRESS NOTE  [name], [place] [address]  DIAGNOSIS: Stage IV (T1c, N1, M1c ) non-small cell lung cancer, adenocarcinoma diagnosed in July 2020 and presented with left upper lobe lung nodule in addition to right upper lobe lung nodule with left hilar lymphadenopathy as well as multiple brain metastasis.   Biomarker Findings Microsatellite status - MS-Stable Tumor Mutational Burden - 4 Muts/Mb Genomic Findings For a complete list of the genes assayed, please refer to the Appendix. ATM G204* KRAS G12C PDGFRA P122T SMO R199W 7 Disease relevant genes with no reportable alterations: ALK, BRAF, EGFR, ERBB2, MET, RET, ROS1   PDL 1 expression was 1%  PRIOR THERAPY: 1) SBRT to multiple brain metastasis under the care of Dr. Isidore Moos. Completed in August 2020 2) SRS to the new metastatic brain lesions under the care of Dr. Saintclair Halsted and Dr. Isidore Moos on 09/02/20 and 12/16/20  CURRENT THERAPY:  Systemic chemotherapy with carboplatin for AUC of 5, Alimta 500 mg/M2 and Keytruda 200 mg IV every 3 weeks.  First dose October 05, 2018.  Status post 55 cycles.  Starting from cycle #5 the patient will be on treatment with maintenance Alimta 500 mg/M2 and Keytruda 200 mg IV every 3 weeks.  She has been on treatment with single agent Keytruda secondary to renal insufficiency  INTERVAL HISTORY: Carolyn [name] 66 y.o. female returns to the clinic today for a follow-up visit. The patient is feeling fairly well today without any concerning complaints. She is expected to have carpal tunnel surgery tomorrow.  She also mentions that a few weeks ago she had sciatica down her left leg. She bought a device for her back which has helped. She mentions she has a mild cramp like sensation in her right quadricept area. Denies dehydration. She states this is different than her sciatic discomfort. Otherwise, she is doing well. She denies any fever,  chills, or weight loss. Denies any chest pain, shortness of breath, cough, or hemoptysis.  Denies any nausea, vomiting, diarrhea.  She sometimes has occasional constipation for which she takes over-the-counter laxatives.  Denies any rashes or skin changes. Denies any headache or visual changes.  She recently had a follow-up visit and a brain MRI with Dr. Isidore Moos which is stable.  They are going to arrange for follow-up imaging in 3 months.  The patient recently had a restaging CT scan of her chest, abdomen, pelvis.  She is here today for evaluation and to review her scan results before starting cycle #56. 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  MEDICATIONS:  Current Outpatient Medications  Medication Sig Dispense Refill   acetaminophen (TYLENOL) 500 MG tablet Take 1,000 mg by mouth every 8 (eight) hours as needed for mild pain, fever or headache.      amLODipine (NORVASC) 10 MG tablet Take 1 tablet by mouth once daily 90 tablet 0   aspirin EC 81 MG tablet Take 81 mg by mouth daily.     carvedilol (COREG) 25 MG tablet Take 1/2 (one-half) tablet by mouth twice daily 180 tablet 0   Cholecalciferol (VITAMIN D-3) 125 MCG (5000 UT) TABS Take 5,000 Units by mouth daily.     doxycycline (VIBRAMYCIN) 100 MG capsule Take 100 mg by mouth  2 (two) times daily.     empagliflozin (JARDIANCE) 25 MG TABS tablet Take 1 tablet (25 mg total) by mouth daily before breakfast. 90 tablet 3   esomeprazole (NEXIUM) 20 MG capsule Take 20 mg by mouth daily with breakfast.     ezetimibe (ZETIA) 10 MG tablet TAKE 1 TABLET BY MOUTH ONCE DAILY AFTER SUPPER 90 tablet 0   fexofenadine (ALLEGRA) 180 MG tablet Take 180 mg by mouth daily.     fluticasone (FLONASE) 50 MCG/ACT nasal spray Place 1 spray into both nostrils daily as needed for allergies or rhinitis.     hydroxychloroquine (PLAQUENIL) 200 MG tablet Take 200 mg by mouth daily.     Lancets (ONETOUCH DELICA PLUS LANCET33G) MISC SMARTSIG:1 Topical 3 Times Daily     lidocaine-prilocaine (EMLA) cream Apply to the Port-A-Cath site 30 to 60 minutes before chemotherapy. 30 g 2   LIVALO 4 MG TABS TAKE 1 TABLET BY MOUTH AT BEDTIME 90 tablet 0   metFORMIN (GLUCOPHAGE) 500 MG tablet Take 500 mg by mouth daily with supper.     methocarbamol (ROBAXIN) 500 MG tablet Take 1 tablet (500 mg total) by mouth every 8 (eight) hours as needed for muscle spasms. 30 tablet 0   montelukast (SINGULAIR) 10 MG tablet Take 10 mg by mouth daily as needed (allergies).     nitroGLYCERIN (NITROSTAT) 0.4 MG SL tablet Place 1 tablet (0.4 mg total) under the tongue every 5 (five) minutes as needed for chest pain. (Patient not taking: Reported on 10/20/2021) 25 tablet 2   ONETOUCH ULTRA test strip USE 1 STRIP TO CHECK GLUCOSE THREE TIMES DAILY     Polyethyl Glycol-Propyl Glycol (SYSTANE) 0.4-0.3 % GEL ophthalmic gel Place 1 application. into both eyes every 6 (six) hours as needed (dry eyes).     telmisartan (MICARDIS) 40 MG tablet Take 40 mg by mouth daily.     vitamin E 180 MG (400 UNITS) capsule Take 400 IU daily x 1 week, then 400 IU BID (Patient taking differently: Take 400 Units by mouth in the morning and at bedtime.) 60 capsule 5   No current facility-administered medications for this visit.    SURGICAL HISTORY:  Past  Surgical History:  Procedure Laterality Date   ABDOMINAL HYSTERECTOMY     bone spur     CARDIAC CATHETERIZATION  06/13/2012   carpel tunnel surgery Left    COLONOSCOPY     9 years ago   CORONARY ANGIOPLASTY  06/13/2012   EYE SURGERY     cyst left eye    IR IMAGING GUIDED PORT INSERTION  10/10/2018   LEFT HEART CATHETERIZATION WITH CORONARY ANGIOGRAM N/A 06/13/2012   Procedure: LEFT HEART CATHETERIZATION WITH CORONARY ANGIOGRAM;  Surgeon: Pamella Pert, MD;  Location: [facility] CATH LAB;  Service: Cardiovascular;  Laterality: N/A;   NECK SURGERY     rotator cuff surgery Right 2015   SHOULDER ARTHROSCOPY WITH ROTATOR CUFF REPAIR AND SUBACROMIAL DECOMPRESSION Left 05/14/2021   Procedure: LEFT SHOULDER ARTHROSCOPY, SUBACROMIAL DECOMPRESSION, BICEPS TENODESIS, MINI OPEN ROTATOR CUFF REPAIR;  Surgeon: Cammy Copa, MD;  Location: [facility] OR;  Service: Orthopedics;  Laterality: Left;   VIDEO BRONCHOSCOPY WITH ENDOBRONCHIAL NAVIGATION N/A 09/06/2018   Procedure: VIDEO BRONCHOSCOPY WITH ENDOBRONCHIAL NAVIGATION, left upper lung;  Surgeon: Corliss Skains, MD;  Location: [facility] OR;  Service: Thoracic;  Laterality: N/A;   VIDEO BRONCHOSCOPY WITH ENDOBRONCHIAL ULTRASOUND Left 09/06/2018   Procedure: VIDEO BRONCHOSCOPY WITH ENDOBRONCHIAL ULTRASOUND, left lung;  Surgeon: Corliss Skains, MD;  Location: [facility] OR;  Service: Thoracic;  Laterality: Left;   WISDOM TOOTH EXTRACTION      REVIEW OF SYSTEMS:   Review of Systems  Constitutional: Negative for appetite change, chills, fatigue, fever and unexpected weight change.  HENT: Negative for mouth sores, nosebleeds, sore throat and trouble swallowing.   Eyes: Negative for eye problems and icterus.  Respiratory: Negative for cough, hemoptysis, shortness of breath and wheezing.   Cardiovascular: Negative for chest pain and leg swelling.  Gastrointestinal: Negative for abdominal pain, constipation, diarrhea, nausea and vomiting.  Genitourinary: Negative for  bladder incontinence, difficulty urinating, dysuria, frequency and hematuria.   Musculoskeletal: Positive for improved sciatica. Positive for pain in right hand. Negative for back pain, gait problem, neck pain and neck stiffness.  Skin: Negative for itching and rash.  Neurological: Negative for dizziness, extremity weakness, gait problem, headaches, light-headedness and seizures.  Hematological: Negative for adenopathy. Does not bruise/bleed easily.  Psychiatric/Behavioral: Negative for confusion, depression and sleep disturbance. The patient is not nervous/anxious.     PHYSICAL EXAMINATION:  Blood pressure (!) 142/70, pulse 64, temperature 98.6 F (37 C), temperature source Oral, SpO2 100 %.  ECOG PERFORMANCE STATUS: 1  Physical Exam  Constitutional: Oriented to person, place, and time and well-developed, well-nourished, and in no distress. HENT:  Head: Normocephalic and atraumatic.  Mouth/Throat: Oropharynx is clear and moist. No oropharyngeal exudate.  Eyes: Conjunctivae are normal. Right eye exhibits no discharge. Left eye exhibits no discharge. No scleral icterus.  Neck: Normal range of motion. Neck supple.  Cardiovascular: Normal rate, regular rhythm, normal heart sounds and intact distal pulses.   Pulmonary/Chest: Effort normal and breath sounds normal. No respiratory distress. No wheezes. No rales.  Abdominal: Soft. Bowel sounds are normal. Exhibits no distension and no mass. There is no tenderness.  Musculoskeletal: Positive for joint stiffness. Wearing brace on right wrist. Normal range of motion. Exhibits no edema.  Lymphadenopathy:    No cervical adenopathy.  Neurological: Alert and oriented to person, place, and time. Exhibits normal muscle tone. Gait normal. Coordination normal.  Skin: Skin is warm and dry. No rash noted. Not diaphoretic. No erythema. No pallor.  Psychiatric: Mood, memory and judgment normal.  Vitals reviewed.  LABORATORY DATA: Lab Results  Component  Value Date   WBC 6.6 12/01/2021   HGB 12.4 12/01/2021   HCT 40.1 12/01/2021   MCV 79.9 (L) 12/01/2021   PLT 246 12/01/2021      Chemistry      Component Value Date/Time   NA 139 11/10/2021 0757   NA 139 08/18/2020 0801   K 4.2 11/10/2021 0757   CL 107 11/10/2021 0757   CO2 28 11/10/2021 0757   BUN 15 11/10/2021 0757   BUN 16 08/18/2020 0801   CREATININE 1.49 (H) 11/10/2021 0757  Component Value Date/Time   CALCIUM 9.0 11/10/2021 0757   ALKPHOS 84 11/10/2021 0757   AST 22 11/10/2021 0757   ALT 13 11/10/2021 0757   BILITOT 0.3 11/10/2021 0757       RADIOGRAPHIC STUDIES:  CT Chest Wo Contrast  Result Date: 11/30/2021 CLINICAL DATA:  Non-small-cell lung cancer. Restaging. * Tracking Code: BO * EXAM: CT CHEST, ABDOMEN AND PELVIS WITHOUT CONTRAST TECHNIQUE: Multidetector CT imaging of the chest, abdomen and pelvis was performed following the standard protocol without IV contrast. RADIATION DOSE REDUCTION: This exam was performed according to the departmental dose-optimization program which includes automated exposure control, adjustment of the mA and/or kV according to patient size and/or use of iterative reconstruction technique. COMPARISON:  08/14/2021 FINDINGS: CT CHEST FINDINGS Cardiovascular: Heart size normal. Trace pericardial effusion is similar to prior. Coronary artery calcification is evident. Moderate atherosclerotic calcification is noted in the wall of the thoracic aorta. Right Port-A-Cath tip is positioned the SVC/RA junction. Mediastinum/Nodes: No mediastinal lymphadenopathy. No evidence for gross hilar lymphadenopathy although assessment is limited by the lack of intravenous contrast on the current study. The esophagus has normal imaging features. There is no axillary lymphadenopathy. Lungs/Pleura: Fine architectural detail in the upper lobes is obscured by motion artifact. Spiculated nodular opacity in the left upper lobe measures 7 mm on today's study, unchanged. 5  mm ground-glass nodule in the medial right middle lobe (70/6) identified previously is stable in the interval. The previously identified right lower lobe 3 mm ground-glass nodule is barely perceptible on image 60/6 today. 4 mm left lower lobe ground-glass nodule on 52/6 is unchanged. No new suspicious pulmonary nodule or mass. No focal airspace consolidation. There is no evidence of pleural effusion. Musculoskeletal: No worrisome lytic or sclerotic osseous abnormality. CT ABDOMEN PELVIS FINDINGS Hepatobiliary: No suspicious focal abnormality in the liver on this study without intravenous contrast. There is no evidence for gallstones, gallbladder wall thickening, or pericholecystic fluid. No intrahepatic or extrahepatic biliary dilation. Pancreas: No focal mass lesion. No dilatation of the main duct. No intraparenchymal cyst. No peripancreatic edema. Spleen: No splenomegaly. No focal mass lesion. Adrenals/Urinary Tract: No adrenal nodule or mass. Kidneys unremarkable. No evidence for hydroureter. The urinary bladder appears normal for the degree of distention. Stomach/Bowel: Stomach is unremarkable. No gastric wall thickening. No evidence of outlet obstruction. Duodenum is normally positioned as is the ligament of Treitz. Duodenal diverticulum noted. No small bowel wall thickening. No small bowel dilatation. The terminal ileum is normal. The appendix is normal. No gross colonic mass. No colonic wall thickening. Small to moderate stool volume throughout. Vascular/Lymphatic: There is moderate atherosclerotic calcification of the abdominal aorta without aneurysm. There is no gastrohepatic or hepatoduodenal ligament lymphadenopathy. No retroperitoneal or mesenteric lymphadenopathy. No pelvic sidewall lymphadenopathy. Reproductive: Hysterectomy.  There is no adnexal mass. Other: No intraperitoneal free fluid. Musculoskeletal: No worrisome lytic or sclerotic osseous abnormality. IMPRESSION: 1. Stable exam. No new or  progressive findings in the chest, abdomen, or pelvis. 2. 7 mm spiculated left upper lobe pulmonary nodule is stable. 3. Bilateral pulmonary nodules are unchanged in the interval. No new or progressive pulmonary nodule or mass. 4. Aortic Atherosclerosis (ICD10-I70.0). Electronically Signed   By: Misty Stanley M.D.   On: 11/30/2021 11:48   CT Abdomen Pelvis Wo Contrast  Result Date: 11/30/2021 CLINICAL DATA:  Non-small-cell lung cancer. Restaging. * Tracking Code: BO * EXAM: CT CHEST, ABDOMEN AND PELVIS WITHOUT CONTRAST TECHNIQUE: Multidetector CT imaging of the chest, abdomen and pelvis was performed following the  standard protocol without IV contrast. RADIATION DOSE REDUCTION: This exam was performed according to the departmental dose-optimization program which includes automated exposure control, adjustment of the mA and/or kV according to patient size and/or use of iterative reconstruction technique. COMPARISON:  08/14/2021 FINDINGS: CT CHEST FINDINGS Cardiovascular: Heart size normal. Trace pericardial effusion is similar to prior. Coronary artery calcification is evident. Moderate atherosclerotic calcification is noted in the wall of the thoracic aorta. Right Port-A-Cath tip is positioned the SVC/RA junction. Mediastinum/Nodes: No mediastinal lymphadenopathy. No evidence for gross hilar lymphadenopathy although assessment is limited by the lack of intravenous contrast on the current study. The esophagus has normal imaging features. There is no axillary lymphadenopathy. Lungs/Pleura: Fine architectural detail in the upper lobes is obscured by motion artifact. Spiculated nodular opacity in the left upper lobe measures 7 mm on today's study, unchanged. 5 mm ground-glass nodule in the medial right middle lobe (70/6) identified previously is stable in the interval. The previously identified right lower lobe 3 mm ground-glass nodule is barely perceptible on image 60/6 today. 4 mm left lower lobe ground-glass  nodule on 52/6 is unchanged. No new suspicious pulmonary nodule or mass. No focal airspace consolidation. There is no evidence of pleural effusion. Musculoskeletal: No worrisome lytic or sclerotic osseous abnormality. CT ABDOMEN PELVIS FINDINGS Hepatobiliary: No suspicious focal abnormality in the liver on this study without intravenous contrast. There is no evidence for gallstones, gallbladder wall thickening, or pericholecystic fluid. No intrahepatic or extrahepatic biliary dilation. Pancreas: No focal mass lesion. No dilatation of the main duct. No intraparenchymal cyst. No peripancreatic edema. Spleen: No splenomegaly. No focal mass lesion. Adrenals/Urinary Tract: No adrenal nodule or mass. Kidneys unremarkable. No evidence for hydroureter. The urinary bladder appears normal for the degree of distention. Stomach/Bowel: Stomach is unremarkable. No gastric wall thickening. No evidence of outlet obstruction. Duodenum is normally positioned as is the ligament of Treitz. Duodenal diverticulum noted. No small bowel wall thickening. No small bowel dilatation. The terminal ileum is normal. The appendix is normal. No gross colonic mass. No colonic wall thickening. Small to moderate stool volume throughout. Vascular/Lymphatic: There is moderate atherosclerotic calcification of the abdominal aorta without aneurysm. There is no gastrohepatic or hepatoduodenal ligament lymphadenopathy. No retroperitoneal or mesenteric lymphadenopathy. No pelvic sidewall lymphadenopathy. Reproductive: Hysterectomy.  There is no adnexal mass. Other: No intraperitoneal free fluid. Musculoskeletal: No worrisome lytic or sclerotic osseous abnormality. IMPRESSION: 1. Stable exam. No new or progressive findings in the chest, abdomen, or pelvis. 2. 7 mm spiculated left upper lobe pulmonary nodule is stable. 3. Bilateral pulmonary nodules are unchanged in the interval. No new or progressive pulmonary nodule or mass. 4. Aortic Atherosclerosis  (ICD10-I70.0). Electronically Signed   By: Misty Stanley M.D.   On: 11/30/2021 11:48   MR Brain W Wo Contrast  Result Date: 11/20/2021 CLINICAL DATA:  Follow-up metastatic disease EXAM: MRI HEAD WITHOUT AND WITH CONTRAST TECHNIQUE: Multiplanar, multiecho pulse sequences of the brain and surrounding structures were obtained without and with intravenous contrast. CONTRAST:  8 cc view way COMPARISON:  09/18/2021.  06/18/2021. FINDINGS: Brain: Continued decrease in enhancement at the site of a treated lesion of the left anterior temporal lobe, with further decrease in edema or gliotic signal in the region. No worsening or progressive finding. Enlargement of a region of enhancement at a treated lesion of the right frontal lobe, measuring 8 mm in size maximal dimension compared with 6 mm on the previous study. Increased regional edema. This could represent tumor growth or treatment effect.  2 mm focus of enhancement adjacent to the atrium of the left lateral ventricle is redemonstrated. This was not present on the study of 03/19/2021 but has been seen subsequently and is enlarging very slowly, though [place] suspicious for a metastatic focus. Punctate enhancement at the inferior left cerebellum is unchanged and stable. No other new or suspicious finding. No hydrocephalus. No sign of ischemic infarction. Enhancement at the genu of the left facial nerve as seen previously most consistent with a small incidental schwannoma. Vascular: Major vessels at the base of the brain show flow. Skull and upper cervical spine: Negative Sinuses/Orbits: Clear/normal Other: None IMPRESSION: 1. Continued decrease in enhancement at the site of a treated lesion of the left anterior temporal lobe, with further decrease in edema or gliotic signal in the region. 2. Enlargement of a region of enhancement at a treated lesion of the right frontal lobe, measuring 8 mm in maximal dimension compared with 6 mm on the previous study. Increased regional  edema. This could represent tumor growth or treatment effect. 3. 2 mm focus of enhancement adjacent to the atrium of the left lateral ventricle is redemonstrated. This was not present on the study of 03/19/2021 but has been seen subsequently and is suspicious for a metastatic focus. 4. Punctate enhancement at the inferior left cerebellum is unchanged over the last several exams. 5. Enhancement at the genu of the left facial nerve as seen previously, most consistent with a small incidental schwannoma. Electronically Signed   By: Nelson Chimes M.D.   On: 11/20/2021 13:14     ASSESSMENT/PLAN:  This is a very pleasant 66 year old African-American female with stage IV (T1c, N1, M1c) non-small cell lung cancer, adenocarcinoma. She presented with a dominant left upper lobe lung nodule in addition to a right upper lobe pulmonary nodule with left hilar lymphadenopathy. She also has metastatic disease to the brain. She has no actionable mutations and her PDL1 expression is 1%. She was diagnosed in July 2020. She also has KRASG12C which is a targetable mutation that can be used in the second line setting.    The patient completed SBRT to the metastatic brain lesion under the care of Dr. Isidore Moos in August 2020. She also had SRS again on 09/02/20 and 12/16/20.    The patient is currently undergoing systemic chemotherapy with carboplatin for an AUC of 5, Alimta 500 mg/m, and Keytruda 200 mg IV every 3 weeks.  She is status post 55 cycles of treatment.  Starting from cycle #5, she has been on maintenance Alimta 500 mg/m2 and Keytruda 200 mg IV. She tolerated her treatment well without any concerning adverse side effects. Alimta was discontinued due to renal insufficiency.   The patient recently had a restaging CT scan performed.  Dr. Julien Nordmann personally and independently reviewed the scan and discussed the results with the patient today.  The scan showed no evidence of disease progression.  Dr. Julien Nordmann recommends she  continue on the same treatment at the same dose.    Labs were reviewed. Her creatinine is 1.52 today. She has baseline CKD and follows closely with nephrology. Recommend that she proceed with cycle #56 today.   We will see her back for follow-up visit in 3 weeks for evaluation and repeat blood work before starting cycle #57.  I will ensure her future lab appointments are flush appointments, since she has a port.   Regarding her mild cramp, we will assess for electrolyte derangement on her CMP. Otherwise, encouraged light stretches, hydrating well, and  massages and heating pad if needed.   The patient was advised to call immediately if she has any concerning symptoms in the interval. The patient voices understanding of current disease status and treatment options and is in agreement with the current care plan. All questions were answered. The patient knows to call the clinic with any problems, questions or concerns. We can certainly see the patient much sooner if necessary  No orders of the defined types were placed in this encounter.    Genevie Elman L Mirren Gest, PA-C 12/01/21  ADDENDUM: Hematology/Oncology Attending: I had a face-to-face encounter with the patient today.  I reviewed her records, lab, scan and recommended her care plan.  This is a very pleasant 66 years old African-American female with a stage IV non-small cell lung cancer, adenocarcinoma diagnosed in July 2020 with positive KRAS G12C mutation with PD-L1 expression of 1%.  The patient is status post SRS to metastatic brain lesion and she underwent systemic chemotherapy initially with carboplatin, Alimta and Keytruda for 4 cycles followed by maintenance treatment with Alimta and Keytruda.  Alimta was discontinued secondary to renal insufficiency and the patient is currently on single agent Keytruda status post a total of 55 cycles.  She has been tolerating her treatment fairly well with no concerning adverse effects. She had  repeat CT scan of the chest, abdomen and pelvis performed recently.  I personally and independently reviewed the scans and discussed the results with the patient today. Her scan showed no concerning findings for disease progression. I recommended for her to continue her maintenance treatment with [facility] and she will proceed with cycle #56 today. The patient is scheduled for carpal tunnel surgery tomorrow. She will come back for follow-up visit in 3 weeks before the next cycle of her treatment. She was advised to call immediately if she has any other concerning symptoms in the interval. The total time spent in the appointment was 30 minutes. Disclaimer: This note was dictated with voice recognition software. Similar sounding words can inadvertently be transcribed and may be missed upon review. Eilleen Kempf, MD

## 2021-11-30 ENCOUNTER — Ambulatory Visit (HOSPITAL_COMMUNITY)
Admission: RE | Admit: 2021-11-30 | Discharge: 2021-11-30 | Disposition: A | Discharge status: Home / Self Care | Payer: Medicare Other | Source: Ambulatory Visit | Attending: Internal Medicine | Admitting: Internal Medicine

## 2021-11-30 DIAGNOSIS — R918 Other nonspecific abnormal finding of lung field: Secondary | ICD-10-CM | POA: Diagnosis not present

## 2021-11-30 DIAGNOSIS — C3490 Malignant neoplasm of unspecified part of unspecified bronchus or lung: Secondary | ICD-10-CM | POA: Diagnosis not present

## 2021-12-01 ENCOUNTER — Other Ambulatory Visit: Payer: Self-pay

## 2021-12-01 ENCOUNTER — Inpatient Hospital Stay: Payer: Medicare Other

## 2021-12-01 ENCOUNTER — Inpatient Hospital Stay (HOSPITAL_BASED_OUTPATIENT_CLINIC_OR_DEPARTMENT_OTHER): Payer: Medicare Other | Admitting: Physician Assistant

## 2021-12-01 VITALS — Wt 183.2 lb

## 2021-12-01 VITALS — BP 142/70 | HR 64 | Temp 98.6°F

## 2021-12-01 DIAGNOSIS — Z5112 Encounter for antineoplastic immunotherapy: Secondary | ICD-10-CM | POA: Diagnosis not present

## 2021-12-01 DIAGNOSIS — R5383 Other fatigue: Secondary | ICD-10-CM

## 2021-12-01 DIAGNOSIS — Z79899 Other long term (current) drug therapy: Secondary | ICD-10-CM | POA: Diagnosis not present

## 2021-12-01 DIAGNOSIS — C3492 Malignant neoplasm of unspecified part of left bronchus or lung: Secondary | ICD-10-CM

## 2021-12-01 DIAGNOSIS — C7931 Secondary malignant neoplasm of brain: Secondary | ICD-10-CM | POA: Diagnosis not present

## 2021-12-01 DIAGNOSIS — C3412 Malignant neoplasm of upper lobe, left bronchus or lung: Secondary | ICD-10-CM | POA: Diagnosis not present

## 2021-12-01 LAB — CMP (CANCER CENTER ONLY)
ALT: 11 U/L (ref 0–44)
AST: 18 U/L (ref 15–41)
Albumin: 3.9 g/dL (ref 3.5–5.0)
Alkaline Phosphatase: 84 U/L (ref 38–126)
Anion gap: 6 (ref 5–15)
BUN: 18 mg/dL (ref 8–23)
CO2: 27 mmol/L (ref 22–32)
Calcium: 9.1 mg/dL (ref 8.9–10.3)
Chloride: 107 mmol/L (ref 98–111)
Creatinine: 1.52 mg/dL — ABNORMAL HIGH (ref 0.44–1.00)
GFR, Estimated: 37 mL/min — ABNORMAL LOW (ref >60)
Glucose, Bld: 149 mg/dL — ABNORMAL HIGH (ref 70–99)
Potassium: 3.9 mmol/L (ref 3.5–5.1)
Sodium: 140 mmol/L (ref 135–145)
Total Bilirubin: 0.4 mg/dL (ref 0.3–1.2)
Total Protein: 7.2 g/dL (ref 6.5–8.1)

## 2021-12-01 LAB — CBC WITH DIFFERENTIAL (CANCER CENTER ONLY)
Abs Immature Granulocytes: 0.02 10*3/uL (ref 0.00–0.07)
Basophils Absolute: 0 10*3/uL (ref 0.0–0.1)
Basophils Relative: 1 %
Eosinophils Absolute: 0.8 10*3/uL — ABNORMAL HIGH (ref 0.0–0.5)
Eosinophils Relative: 12 %
HCT: 40.1 % (ref 36.0–46.0)
Hemoglobin: 12.4 g/dL (ref 12.0–15.0)
Immature Granulocytes: 0 %
Lymphocytes Relative: 17 %
Lymphs Abs: 1.2 10*3/uL (ref 0.7–4.0)
MCH: 24.7 pg — ABNORMAL LOW (ref 26.0–34.0)
MCHC: 30.9 g/dL (ref 30.0–36.0)
MCV: 79.9 fL — ABNORMAL LOW (ref 80.0–100.0)
Monocytes Absolute: 0.5 10*3/uL (ref 0.1–1.0)
Monocytes Relative: 7 %
Neutro Abs: 4.2 10*3/uL (ref 1.7–7.7)
Neutrophils Relative %: 63 %
Platelet Count: 246 10*3/uL (ref 150–400)
RBC: 5.02 MIL/uL (ref 3.87–5.11)
RDW: 16.4 % — ABNORMAL HIGH (ref 11.5–15.5)
WBC Count: 6.6 10*3/uL (ref 4.0–10.5)
nRBC: 0 % (ref 0.0–0.2)

## 2021-12-01 LAB — TSH: TSH: 1.509 u[IU]/mL (ref 0.350–4.500)

## 2021-12-01 MED ORDER — SODIUM CHLORIDE 0.9 % IV SOLN
200.0000 mg | Freq: Once | INTRAVENOUS | Status: AC
  Administered 2021-12-01: 200 mg via INTRAVENOUS
  Filled 2021-12-01: qty 200

## 2021-12-01 MED ORDER — HEPARIN SOD (PORK) LOCK FLUSH 100 UNIT/ML IV SOLN
500.0000 [IU] | Freq: Once | INTRAVENOUS | Status: AC | PRN
  Administered 2021-12-01: 500 [IU]

## 2021-12-01 MED ORDER — SODIUM CHLORIDE 0.9 % IV SOLN: Freq: Once | INTRAVENOUS | Status: AC

## 2021-12-01 MED ORDER — SODIUM CHLORIDE 0.9% FLUSH
10.0000 mL | INTRAVENOUS | Status: DC | PRN
  Administered 2021-12-01: 10 mL

## 2021-12-01 NOTE — Patient Instructions (Signed)
[facility]  Discharge Instructions: Thank you for choosing [facility] to provide your oncology and hematology care.   If you have a lab appointment with the [facility], please go directly to the [facility] and check in at the registration area.   Wear comfortable clothing and clothing appropriate for easy access to any Portacath or PICC line.   We strive to give you quality time with your provider. You may need to reschedule your appointment if you arrive late (15 or more minutes).  Arriving late affects you and other patients whose appointments are after yours.  Also, if you miss three or more appointments without notifying the office, you may be dismissed from the clinic at the provider's discretion.      For prescription refill requests, have your pharmacy contact our office and allow 72 hours for refills to be completed.    Today you received the following chemotherapy and/or immunotherapy agents :  Pembrolizumab      To help prevent nausea and vomiting after your treatment, we encourage you to take your nausea medication as directed.  BELOW ARE SYMPTOMS THAT SHOULD BE REPORTED IMMEDIATELY: *FEVER GREATER THAN 100.4 F (38 C) OR HIGHER *CHILLS OR SWEATING *NAUSEA AND VOMITING THAT IS NOT CONTROLLED WITH YOUR NAUSEA MEDICATION *UNUSUAL SHORTNESS OF BREATH *UNUSUAL BRUISING OR BLEEDING *URINARY PROBLEMS (pain or burning when urinating, or frequent urination) *BOWEL PROBLEMS (unusual diarrhea, constipation, pain near the anus) TENDERNESS IN MOUTH AND THROAT WITH OR WITHOUT PRESENCE OF ULCERS (sore throat, sores in mouth, or a toothache) UNUSUAL RASH, SWELLING OR PAIN  UNUSUAL VAGINAL DISCHARGE OR ITCHING   Items with * indicate a potential emergency and should be followed up as soon as possible or go to the Emergency Department if any problems should occur.  Please show the CHEMOTHERAPY ALERT CARD or IMMUNOTHERAPY ALERT CARD at  check-in to the Emergency Department and triage nurse.  Should you have questions after your visit or need to cancel or reschedule your appointment, please contact [facility]  Dept: [phone]  and follow the prompts.  Office hours are 8:00 a.m. to 4:30 p.m. Monday - Friday. Please note that voicemails left after 4:00 p.m. may not be returned until the following business day.  We are closed weekends and major holidays. You have access to a nurse at all times for urgent questions. Please call the main number to the clinic Dept: [phone] and follow the prompts.   For any non-urgent questions, you may also contact your provider using MyChart. We now offer e-Visits for anyone 66 and older to request care online for non-urgent symptoms. For details visit [email or web address].   Also download the MyChart app! Go to the app store, search "MyChart", open the app, select [facility], and log in with your MyChart username and password.  Masks are optional in the cancer centers. If you would like for your care team to wear a mask while they are taking care of you, please let them know. You may have one support person who is at least 68 years old accompany you for your appointments.

## 2021-12-02 ENCOUNTER — Ambulatory Visit: Payer: Medicare Other | Admitting: Physician Assistant

## 2021-12-02 DIAGNOSIS — M65332 Trigger finger, left middle finger: Secondary | ICD-10-CM | POA: Diagnosis not present

## 2021-12-02 DIAGNOSIS — M65331 Trigger finger, right middle finger: Secondary | ICD-10-CM | POA: Diagnosis not present

## 2021-12-02 DIAGNOSIS — M65341 Trigger finger, right ring finger: Secondary | ICD-10-CM | POA: Diagnosis not present

## 2021-12-02 DIAGNOSIS — M654 Radial styloid tenosynovitis [de Quervain]: Secondary | ICD-10-CM | POA: Diagnosis not present

## 2021-12-16 ENCOUNTER — Other Ambulatory Visit: Payer: Self-pay | Admitting: Radiation Therapy

## 2021-12-16 DIAGNOSIS — N1832 Chronic kidney disease, stage 3b: Secondary | ICD-10-CM | POA: Diagnosis not present

## 2021-12-16 DIAGNOSIS — C7931 Secondary malignant neoplasm of brain: Secondary | ICD-10-CM

## 2021-12-16 DIAGNOSIS — E1122 Type 2 diabetes mellitus with diabetic chronic kidney disease: Secondary | ICD-10-CM | POA: Diagnosis not present

## 2021-12-16 DIAGNOSIS — C3492 Malignant neoplasm of unspecified part of left bronchus or lung: Secondary | ICD-10-CM | POA: Diagnosis not present

## 2021-12-16 DIAGNOSIS — I129 Hypertensive chronic kidney disease with stage 1 through stage 4 chronic kidney disease, or unspecified chronic kidney disease: Secondary | ICD-10-CM | POA: Diagnosis not present

## 2021-12-18 ENCOUNTER — Emergency Department (HOSPITAL_COMMUNITY)
Admission: EM | Admit: 2021-12-18 | Discharge: 2021-12-18 | Disposition: A | Discharge status: Home / Self Care | Payer: Medicare Other | Attending: Student | Admitting: Student

## 2021-12-18 ENCOUNTER — Other Ambulatory Visit: Payer: Self-pay

## 2021-12-18 ENCOUNTER — Emergency Department (HOSPITAL_COMMUNITY): Payer: Medicare Other

## 2021-12-18 ENCOUNTER — Encounter (HOSPITAL_COMMUNITY): Payer: Self-pay

## 2021-12-18 DIAGNOSIS — Z7951 Long term (current) use of inhaled steroids: Secondary | ICD-10-CM | POA: Insufficient documentation

## 2021-12-18 DIAGNOSIS — Z87891 Personal history of nicotine dependence: Secondary | ICD-10-CM | POA: Diagnosis not present

## 2021-12-18 DIAGNOSIS — Z7984 Long term (current) use of oral hypoglycemic drugs: Secondary | ICD-10-CM | POA: Insufficient documentation

## 2021-12-18 DIAGNOSIS — N189 Chronic kidney disease, unspecified: Secondary | ICD-10-CM | POA: Insufficient documentation

## 2021-12-18 DIAGNOSIS — Z79899 Other long term (current) drug therapy: Secondary | ICD-10-CM | POA: Diagnosis not present

## 2021-12-18 DIAGNOSIS — R079 Chest pain, unspecified: Secondary | ICD-10-CM

## 2021-12-18 DIAGNOSIS — M79602 Pain in left arm: Secondary | ICD-10-CM

## 2021-12-18 DIAGNOSIS — E1122 Type 2 diabetes mellitus with diabetic chronic kidney disease: Secondary | ICD-10-CM | POA: Insufficient documentation

## 2021-12-18 DIAGNOSIS — I2510 Atherosclerotic heart disease of native coronary artery without angina pectoris: Secondary | ICD-10-CM | POA: Insufficient documentation

## 2021-12-18 DIAGNOSIS — Z7982 Long term (current) use of aspirin: Secondary | ICD-10-CM | POA: Diagnosis not present

## 2021-12-18 DIAGNOSIS — R001 Bradycardia, unspecified: Secondary | ICD-10-CM | POA: Insufficient documentation

## 2021-12-18 DIAGNOSIS — Z951 Presence of aortocoronary bypass graft: Secondary | ICD-10-CM | POA: Diagnosis not present

## 2021-12-18 DIAGNOSIS — J45909 Unspecified asthma, uncomplicated: Secondary | ICD-10-CM | POA: Insufficient documentation

## 2021-12-18 DIAGNOSIS — R0789 Other chest pain: Secondary | ICD-10-CM | POA: Diagnosis not present

## 2021-12-18 DIAGNOSIS — I129 Hypertensive chronic kidney disease with stage 1 through stage 4 chronic kidney disease, or unspecified chronic kidney disease: Secondary | ICD-10-CM | POA: Diagnosis not present

## 2021-12-18 DIAGNOSIS — M79622 Pain in left upper arm: Secondary | ICD-10-CM | POA: Diagnosis not present

## 2021-12-18 LAB — BASIC METABOLIC PANEL
Anion gap: 9 (ref 5–15)
BUN: 17 mg/dL (ref 8–23)
CO2: 26 mmol/L (ref 22–32)
Calcium: 9.3 mg/dL (ref 8.9–10.3)
Chloride: 106 mmol/L (ref 98–111)
Creatinine, Ser: 1.65 mg/dL — ABNORMAL HIGH (ref 0.44–1.00)
GFR, Estimated: 34 mL/min — ABNORMAL LOW (ref >60)
Glucose, Bld: 99 mg/dL (ref 70–99)
Potassium: 4.2 mmol/L (ref 3.5–5.1)
Sodium: 141 mmol/L (ref 135–145)

## 2021-12-18 LAB — CBC
HCT: 44.3 % (ref 36.0–46.0)
Hemoglobin: 13.3 g/dL (ref 12.0–15.0)
MCH: 24.4 pg — ABNORMAL LOW (ref 26.0–34.0)
MCHC: 30 g/dL (ref 30.0–36.0)
MCV: 81.4 fL (ref 80.0–100.0)
Platelets: 281 10*3/uL (ref 150–400)
RBC: 5.44 MIL/uL — ABNORMAL HIGH (ref 3.87–5.11)
RDW: 16.5 % — ABNORMAL HIGH (ref 11.5–15.5)
WBC: 6.7 10*3/uL (ref 4.0–10.5)
nRBC: 0 % (ref 0.0–0.2)

## 2021-12-18 LAB — TROPONIN I (HIGH SENSITIVITY)
Troponin I (High Sensitivity): 2 ng/L (ref <18)
Troponin I (High Sensitivity): <2 ng/L (ref <18)

## 2021-12-18 NOTE — ED Triage Notes (Signed)
Pt c/o left arm pain. Reports she did have surgery on left arm back in March so it isn't unusual for it to bother her. Pt states the arm pain became worse yesterday and has had some intermittent centralized chest pain.

## 2021-12-18 NOTE — Discharge Instructions (Addendum)
You were seen in the emergency department for evaluation of chest and arm pain.  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  This is benign and should not occur frequently.  If you do have recurrence of your chest pain and is associated with shortness of breath or sweating, please come to the emergency department for further evaluation.

## 2021-12-18 NOTE — Progress Notes (Unsigned)
[facility] OFFICE PROGRESS NOTE  [name], [place] [id number]  DIAGNOSIS: Stage IV (T1c, N1, M1c ) non-small cell lung cancer, adenocarcinoma diagnosed in July 2020 and presented with left upper lobe lung nodule in addition to right upper lobe lung nodule with left hilar lymphadenopathy as well as multiple brain metastasis.   Biomarker Findings Microsatellite status - MS-Stable Tumor Mutational Burden - 4 Muts/Mb Genomic Findings For a complete list of the genes assayed, please refer to the Appendix. ATM G204* KRAS G12C PDGFRA P122T SMO R199W 7 Disease relevant genes with no reportable alterations: ALK, BRAF, EGFR, ERBB2, MET, RET, ROS1   PDL 1 expression was 1%  PRIOR THERAPY: 1) SBRT to multiple brain metastasis under the care of Dr. Isidore Moos. Completed in August 2020 2) SRS to the new metastatic brain lesions under the care of Dr. Saintclair Halsted and Dr. Isidore Moos on 09/02/20 and 12/16/20  CURRENT THERAPY:  Systemic chemotherapy with carboplatin for AUC of 5, Alimta 500 mg/M2 and Keytruda 200 mg IV every 3 weeks.  First dose October 05, 2018.  Status post 56 cycles.  Starting from cycle #5 the patient will be on treatment with maintenance Alimta 500 mg/M2 and Keytruda 200 mg IV every 3 weeks.  She has been on treatment with single agent Keytruda secondary to renal insufficiency   INTERVAL HISTORY: Carolyn [name] 66 y.o. female returns to the clinic today for a follow-up visit. The patient is feeling fairly well today without any concerning complaints.  Since last being seen, she underwent carpal tunnel surgery and she tolerated well.  She was seen in the emergency room on Saturday for left arm pain.  She also had some sharp chest pain.  She has been following with orthopedics for pain in her left shoulder/joint, however, when the pain started going down her arm is what prompted her emergency room visit.  She was diagnosed with precordial  catch syndrome.  She is not having any pain at this time.  She is scheduled to follow-up with orthopedic provider on 11/29.  Otherwise, she is doing well. She denies any fever, chills, or weight loss. Denies any chest pain, shortness of breath, cough, or hemoptysis.  Denies any nausea, vomiting, diarrhea.  She sometimes has occasional constipation for which she takes over-the-counter laxatives.  Denies any rashes or skin changes. Denies any headache or visual changes.  She recently had a follow-up visit and a brain MRI with Dr. Isidore Moos which is stable. She is here today for evaluation and repeat blood work before starting cycle #57.    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  MEDICATIONS:  Current Outpatient Medications  Medication Sig Dispense Refill   acetaminophen (TYLENOL) 500 MG tablet Take 1,000 mg by mouth every 8 (eight) hours as needed for mild pain, fever or headache.      amLODipine (NORVASC) 10 MG tablet Take 1 tablet by mouth once daily 90 tablet 0   aspirin EC 81 MG tablet Take 81 mg by mouth daily.     carvedilol (COREG) 25 MG tablet Take 1/2 (one-half) tablet by mouth twice daily 180 tablet 0   Cholecalciferol (VITAMIN D-3) 125 MCG (5000 UT) TABS Take 5,000 Units by mouth daily.     doxycycline (VIBRAMYCIN) 100 MG  capsule Take 100 mg by mouth 2 (two) times daily.     empagliflozin (JARDIANCE) 25 MG TABS tablet Take 1 tablet (25 mg total) by mouth daily before breakfast. 90 tablet 3   esomeprazole (NEXIUM) 20 MG capsule Take 20 mg by mouth daily with breakfast.     ezetimibe (ZETIA) 10 MG tablet TAKE 1 TABLET BY MOUTH ONCE DAILY AFTER SUPPER 90 tablet 0   fexofenadine (ALLEGRA) 180 MG tablet Take 180 mg by mouth daily.     fluticasone (FLONASE) 50 MCG/ACT nasal spray Place 1 spray into both nostrils daily as needed for allergies or rhinitis.     hydroxychloroquine (PLAQUENIL) 200 MG tablet Take 200 mg by mouth daily.     Lancets (ONETOUCH DELICA PLUS [id number]) MISC SMARTSIG:1 Topical 3 Times Daily     lidocaine-prilocaine (EMLA) cream Apply to the Port-A-Cath site 30 to 60 minutes before chemotherapy. 30 g 2   LIVALO 4 MG TABS TAKE 1 TABLET BY MOUTH AT BEDTIME 90 tablet 0   metFORMIN (GLUCOPHAGE) 500 MG tablet Take 500 mg by mouth daily with supper.     methocarbamol (ROBAXIN) 500 MG tablet Take 1 tablet (500 mg total) by mouth every 8 (eight) hours as needed for muscle spasms. 30 tablet 0   montelukast (SINGULAIR) 10 MG tablet Take 10 mg by mouth daily as needed (allergies).     nitroGLYCERIN (NITROSTAT) 0.4 MG SL tablet Place 1 tablet (0.4 mg total) under the tongue every 5 (five) minutes as needed for chest pain. (Patient not taking: Reported on 10/20/2021) 25 tablet 2   ONETOUCH ULTRA test strip USE 1 STRIP TO CHECK GLUCOSE THREE TIMES DAILY     Polyethyl Glycol-Propyl Glycol (SYSTANE) 0.4-0.3 % GEL ophthalmic gel Place 1 application. into both eyes every 6 (six) hours as needed (dry eyes).     telmisartan (MICARDIS) 40 MG tablet Take 40 mg by mouth daily.     vitamin E 180 MG (400 UNITS) capsule Take 400 IU daily x 1 week, then 400 IU BID (Patient taking differently: Take 400 Units by mouth in the morning and at bedtime.) 60 capsule 5   No current facility-administered medications for this visit.     SURGICAL HISTORY:  Past Surgical History:  Procedure Laterality Date   ABDOMINAL HYSTERECTOMY     bone spur     CARDIAC CATHETERIZATION  06/13/2012   carpel tunnel surgery Left    COLONOSCOPY     9 years ago   CORONARY ANGIOPLASTY  06/13/2012   EYE SURGERY     cyst left eye    IR IMAGING GUIDED PORT INSERTION  10/10/2018   LEFT HEART CATHETERIZATION WITH CORONARY ANGIOGRAM N/A 06/13/2012   Procedure: LEFT HEART CATHETERIZATION WITH CORONARY ANGIOGRAM;  Surgeon: Laverda Page, MD;  Location:  [facility];  Service: Cardiovascular;  Laterality: N/A;   NECK SURGERY     rotator cuff surgery Right 2015   SHOULDER ARTHROSCOPY WITH ROTATOR CUFF REPAIR AND SUBACROMIAL DECOMPRESSION Left 05/14/2021   Procedure: LEFT SHOULDER ARTHROSCOPY, SUBACROMIAL DECOMPRESSION, BICEPS TENODESIS, MINI OPEN ROTATOR CUFF REPAIR;  Surgeon: Meredith Pel, MD;  Location: [facility];  Service: Orthopedics;  Laterality: Left;   VIDEO BRONCHOSCOPY WITH ENDOBRONCHIAL NAVIGATION N/A 09/06/2018   Procedure: VIDEO BRONCHOSCOPY WITH ENDOBRONCHIAL NAVIGATION, left upper lung;  Surgeon: Lajuana Matte, MD;  Location: [facility];  Service: Thoracic;  Laterality: N/A;   VIDEO BRONCHOSCOPY WITH ENDOBRONCHIAL ULTRASOUND Left 09/06/2018   Procedure: VIDEO BRONCHOSCOPY WITH ENDOBRONCHIAL ULTRASOUND, left lung;  Surgeon: Lajuana Matte, MD;  Location: [facility];  Service: Thoracic;  Laterality: Left;   WISDOM TOOTH EXTRACTION      REVIEW OF SYSTEMS:   Constitutional: Negative for appetite change, chills, fatigue, fever and unexpected weight change.  HENT: Negative for mouth sores, nosebleeds, sore throat and trouble swallowing.   Eyes: Negative for eye problems and icterus.  Respiratory: Negative for cough, hemoptysis, shortness of breath and wheezing.   Cardiovascular: Negative for chest pain and leg swelling.  Gastrointestinal: Negative for abdominal pain, constipation (occasionally, none at this time), diarrhea, nausea and  vomiting.  Genitourinary: Negative for bladder incontinence, difficulty urinating, dysuria, frequency and hematuria.   Musculoskeletal: Positive for occasional left shoulder pain.  Negative for back pain, gait problem, neck pain and neck stiffness.  Skin: Negative for itching and rash.  Neurological: Negative for dizziness, extremity weakness, gait problem, headaches, light-headedness and seizures.  Hematological: Negative for adenopathy. Does not bruise/bleed easily.  Psychiatric/Behavioral: Negative for confusion, depression and sleep disturbance. The patient is not nervous/anxious.     PHYSICAL EXAMINATION:  Blood pressure 139/63, pulse (!) 59, temperature 98.2 F (36.8 C), resp. rate 17, weight 184 lb 12.8 oz (83.8 kg), SpO2 100 %.  ECOG PERFORMANCE STATUS: 1  Physical Exam  Constitutional: Oriented to person, place, and time and well-developed, well-nourished, and in no distress. HENT:  Head: Normocephalic and atraumatic.  Mouth/Throat: Oropharynx is clear and moist. No oropharyngeal exudate.  Eyes: Conjunctivae are normal. Right eye exhibits no discharge. Left eye exhibits no discharge. No scleral icterus.  Neck: Normal range of motion. Neck supple.  Cardiovascular: Normal rate, regular rhythm, normal heart sounds and intact distal pulses.   Pulmonary/Chest: Effort normal and breath sounds normal. No respiratory distress. No wheezes. No rales.  Abdominal: Soft. Bowel sounds are normal. Exhibits no distension and no mass. There is no tenderness.  Musculoskeletal: Positive for joint stiffness. Wearing brace on right wrist. Normal range of motion. Exhibits no edema.  Lymphadenopathy:    No cervical adenopathy.  Neurological: Alert and oriented to person, place, and time. Exhibits normal muscle tone. Gait normal. Coordination normal.  Skin: Skin is warm and dry. No rash noted. Not diaphoretic. No erythema. No pallor.  Psychiatric: Mood, memory and judgment normal.  Vitals  reviewed.  LABORATORY DATA: Lab Results  Component Value Date   WBC 5.5 12/22/2021   HGB 12.1 12/22/2021   HCT 38.8 12/22/2021   MCV 80.2 12/22/2021   PLT 233 12/22/2021      Chemistry      Component Value Date/Time   NA 138 12/22/2021 0948   NA 139 08/18/2020 0801   K 4.1 12/22/2021 0948   CL 105 12/22/2021 0948   CO2 27 12/22/2021 0948   BUN 18 12/22/2021 0948   BUN 16  08/18/2020 0801   CREATININE 1.70 (H) 12/22/2021 0948      Component Value Date/Time   CALCIUM 9.1 12/22/2021 0948   ALKPHOS 85 12/22/2021 0948   AST 18 12/22/2021 0948   ALT 12 12/22/2021 0948   BILITOT 0.4 12/22/2021 0948       RADIOGRAPHIC STUDIES:  DG Chest 2 View  Result Date: 12/18/2021 CLINICAL DATA:  Chest pain. EXAM: CHEST - 2 VIEW COMPARISON:  Chest radiograph dated 11/10/2020. FINDINGS: Right-sided Port-A-Cath in similar position. No focal consolidation, pleural effusion, or pneumothorax. The cardiac silhouette is within normal limits. Atherosclerotic calcification of the aorta. No acute osseous pathology. IMPRESSION: No active cardiopulmonary disease. Electronically Signed   By: Anner Crete M.D.   On: 12/18/2021 19:27   CT Chest Wo Contrast  Result Date: 11/30/2021 CLINICAL DATA:  Non-small-cell lung cancer. Restaging. * Tracking Code: BO * EXAM: CT CHEST, ABDOMEN AND PELVIS WITHOUT CONTRAST TECHNIQUE: Multidetector CT imaging of the chest, abdomen and pelvis was performed following the standard protocol without IV contrast. RADIATION DOSE REDUCTION: This exam was performed according to the departmental dose-optimization program which includes automated exposure control, adjustment of the mA and/or kV according to patient size and/or use of iterative reconstruction technique. COMPARISON:  08/14/2021 FINDINGS: CT CHEST FINDINGS Cardiovascular: Heart size normal. Trace pericardial effusion is similar to prior. Coronary artery calcification is evident. Moderate atherosclerotic calcification  is noted in the wall of the thoracic aorta. Right Port-A-Cath tip is positioned the SVC/RA junction. Mediastinum/Nodes: No mediastinal lymphadenopathy. No evidence for gross hilar lymphadenopathy although assessment is limited by the lack of intravenous contrast on the current study. The esophagus has normal imaging features. There is no axillary lymphadenopathy. Lungs/Pleura: Fine architectural detail in the upper lobes is obscured by motion artifact. Spiculated nodular opacity in the left upper lobe measures 7 mm on today's study, unchanged. 5 mm ground-glass nodule in the medial right middle lobe (70/6) identified previously is stable in the interval. The previously identified right lower lobe 3 mm ground-glass nodule is barely perceptible on image 60/6 today. 4 mm left lower lobe ground-glass nodule on 52/6 is unchanged. No new suspicious pulmonary nodule or mass. No focal airspace consolidation. There is no evidence of pleural effusion. Musculoskeletal: No worrisome lytic or sclerotic osseous abnormality. CT ABDOMEN PELVIS FINDINGS Hepatobiliary: No suspicious focal abnormality in the liver on this study without intravenous contrast. There is no evidence for gallstones, gallbladder wall thickening, or pericholecystic fluid. No intrahepatic or extrahepatic biliary dilation. Pancreas: No focal mass lesion. No dilatation of the main duct. No intraparenchymal cyst. No peripancreatic edema. Spleen: No splenomegaly. No focal mass lesion. Adrenals/Urinary Tract: No adrenal nodule or mass. Kidneys unremarkable. No evidence for hydroureter. The urinary bladder appears normal for the degree of distention. Stomach/Bowel: Stomach is unremarkable. No gastric wall thickening. No evidence of outlet obstruction. Duodenum is normally positioned as is the ligament of Treitz. Duodenal diverticulum noted. No small bowel wall thickening. No small bowel dilatation. The terminal ileum is normal. The appendix is normal. No gross  colonic mass. No colonic wall thickening. Small to moderate stool volume throughout. Vascular/Lymphatic: There is moderate atherosclerotic calcification of the abdominal aorta without aneurysm. There is no gastrohepatic or hepatoduodenal ligament lymphadenopathy. No retroperitoneal or mesenteric lymphadenopathy. No pelvic sidewall lymphadenopathy. Reproductive: Hysterectomy.  There is no adnexal mass. Other: No intraperitoneal free fluid. Musculoskeletal: No worrisome lytic or sclerotic osseous abnormality. IMPRESSION: 1. Stable exam. No new or progressive findings in the chest, abdomen, or pelvis. 2. 7 mm  spiculated left upper lobe pulmonary nodule is stable. 3. Bilateral pulmonary nodules are unchanged in the interval. No new or progressive pulmonary nodule or mass. 4. Aortic Atherosclerosis (ICD10-I70.0). Electronically Signed   By: Misty Stanley M.D.   On: 11/30/2021 11:48   CT Abdomen Pelvis Wo Contrast  Result Date: 11/30/2021 CLINICAL DATA:  Non-small-cell lung cancer. Restaging. * Tracking Code: BO * EXAM: CT CHEST, ABDOMEN AND PELVIS WITHOUT CONTRAST TECHNIQUE: Multidetector CT imaging of the chest, abdomen and pelvis was performed following the standard protocol without IV contrast. RADIATION DOSE REDUCTION: This exam was performed according to the departmental dose-optimization program which includes automated exposure control, adjustment of the mA and/or kV according to patient size and/or use of iterative reconstruction technique. COMPARISON:  08/14/2021 FINDINGS: CT CHEST FINDINGS Cardiovascular: Heart size normal. Trace pericardial effusion is similar to prior. Coronary artery calcification is evident. Moderate atherosclerotic calcification is noted in the wall of the thoracic aorta. Right Port-A-Cath tip is positioned the SVC/RA junction. Mediastinum/Nodes: No mediastinal lymphadenopathy. No evidence for gross hilar lymphadenopathy although assessment is limited by the lack of intravenous  contrast on the current study. The esophagus has normal imaging features. There is no axillary lymphadenopathy. Lungs/Pleura: Fine architectural detail in the upper lobes is obscured by motion artifact. Spiculated nodular opacity in the left upper lobe measures 7 mm on today's study, unchanged. 5 mm ground-glass nodule in the medial right middle lobe (70/6) identified previously is stable in the interval. The previously identified right lower lobe 3 mm ground-glass nodule is barely perceptible on image 60/6 today. 4 mm left lower lobe ground-glass nodule on 52/6 is unchanged. No new suspicious pulmonary nodule or mass. No focal airspace consolidation. There is no evidence of pleural effusion. Musculoskeletal: No worrisome lytic or sclerotic osseous abnormality. CT ABDOMEN PELVIS FINDINGS Hepatobiliary: No suspicious focal abnormality in the liver on this study without intravenous contrast. There is no evidence for gallstones, gallbladder wall thickening, or pericholecystic fluid. No intrahepatic or extrahepatic biliary dilation. Pancreas: No focal mass lesion. No dilatation of the main duct. No intraparenchymal cyst. No peripancreatic edema. Spleen: No splenomegaly. No focal mass lesion. Adrenals/Urinary Tract: No adrenal nodule or mass. Kidneys unremarkable. No evidence for hydroureter. The urinary bladder appears normal for the degree of distention. Stomach/Bowel: Stomach is unremarkable. No gastric wall thickening. No evidence of outlet obstruction. Duodenum is normally positioned as is the ligament of Treitz. Duodenal diverticulum noted. No small bowel wall thickening. No small bowel dilatation. The terminal ileum is normal. The appendix is normal. No gross colonic mass. No colonic wall thickening. Small to moderate stool volume throughout. Vascular/Lymphatic: There is moderate atherosclerotic calcification of the abdominal aorta without aneurysm. There is no gastrohepatic or hepatoduodenal ligament  lymphadenopathy. No retroperitoneal or mesenteric lymphadenopathy. No pelvic sidewall lymphadenopathy. Reproductive: Hysterectomy.  There is no adnexal mass. Other: No intraperitoneal free fluid. Musculoskeletal: No worrisome lytic or sclerotic osseous abnormality. IMPRESSION: 1. Stable exam. No new or progressive findings in the chest, abdomen, or pelvis. 2. 7 mm spiculated left upper lobe pulmonary nodule is stable. 3. Bilateral pulmonary nodules are unchanged in the interval. No new or progressive pulmonary nodule or mass. 4. Aortic Atherosclerosis (ICD10-I70.0). Electronically Signed   By: Misty Stanley M.D.   On: 11/30/2021 11:48     ASSESSMENT/PLAN:  This is a very pleasant 66 year old African-American female with stage IV (T1c, N1, M1c) non-small cell lung cancer, adenocarcinoma. She presented with a dominant left upper lobe lung nodule in addition to a right upper  lobe pulmonary nodule with left hilar lymphadenopathy. She also has metastatic disease to the brain. She has no actionable mutations and her PDL1 expression is 1%. She was diagnosed in July 2020. She also has KRASG12C which is a targetable mutation that can be used in the second line setting.    The patient completed SBRT to the metastatic brain lesion under the care of Dr. Isidore Moos in August 2020. She also had SRS again on 09/02/20 and 12/16/20.    he patient is currently undergoing systemic chemotherapy with carboplatin for an AUC of 5, Alimta 500 mg/m, and Keytruda 200 mg IV every 3 weeks.  She is status post 56 cycles of treatment.  Starting from cycle #5, she has been on maintenance Alimta 500 mg/m2 and Keytruda 200 mg IV. She tolerated her treatment well without any concerning adverse side effects. Alimta was discontinued due to renal insufficiency.     Labs were reviewed.  Her creatinine is 1.7 today. She has baseline CKD and follows closely with nephrology.  Recommend that she proceed with cycle 57 today scheduled.   She will see  her back for follow-up visit in 3 weeks for evaluation and repeat blood work before starting cycle #58.    The patient will follow with her orthopedic provider on 11/29 for her left shoulder pain.  The patient had questions if she was able to take Zyrtec instead of Allegra.  Discussed she may take Zyrtec instead of Allegra.  The patient was advised to call immediately if she has any concerning symptoms in the interval. The patient voices understanding of current disease status and treatment options and is in agreement with the current care plan. All questions were answered. The patient knows to call the clinic with any problems, questions or concerns. We can certainly see the patient much sooner if necessary     No orders of the defined types were placed in this encounter.    The total time spent in the appointment was 20-29 minutes.   [place] Kretz L Cliffton Spradley, PA-C 12/22/21

## 2021-12-19 NOTE — ED Provider Notes (Signed)
[facility] Provider Note  CSN: [id number] Arrival date & time: 12/18/21 1729  Chief Complaint(s) No chief complaint on file.  HPI Carolyn [name] is a 66 y.o. 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  Patient is currently following with orthopedics after a shoulder surgery and states that she has been dealing with left shoulder pain for a long time but today she had a seconds long episode of acute onset left-sided chest pain with no associated shortness of breath, diaphoresis, nausea, vomiting.  She states that because she had chest pain in association with her arm pain she wanted to come to the emergency department for further evaluation.  She currently denies any chest pain, shortness of breath, abdominal pain, nausea, vomiting, numbness, tingling, weakness or other systemic symptoms.   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   Hypertension    nscl ca dx'd 08/07/18   Persistent headaches    h/o   Pneumonia    hx of PNA   Shortness of breath    Vaginal dryness    h/o   Patient Active Problem List   Diagnosis Date Noted   Complete tear of left rotator cuff    Biceps tendonitis, left    Degenerative superior labral anterior-to-posterior (SLAP) tear of left shoulder    Elevated serum creatinine 04/12/2019   Pyelonephritis 02/08/2019   Port-A-Cath in place  10/19/2018   Encounter for antineoplastic chemotherapy 09/28/2018   Encounter for antineoplastic immunotherapy 09/28/2018   Goals of care, counseling/discussion 09/28/2018   Adenocarcinoma, lung ([place]) 09/12/2018   Malignant neoplasm metastatic to brain ([place]) 08/30/2018   Mass of upper lobe of left lung 08/17/2018   Angina pectoris ([place]) 06/14/2012   CAD (coronary artery disease), native coronary artery 06/14/2012   S/P PTCA (percutaneous transluminal coronary angioplasty) 06/14/2012   Hyperlipidemia 06/14/2012   Essential hypertension 06/14/2012   Pre-diabetes 06/14/2012   Home Medication(s) Prior to Admission medications   Medication Sig Start Date End Date Taking? Authorizing Provider  acetaminophen (TYLENOL) 500 MG tablet Take 1,000 mg by mouth every 8 (eight) hours as needed for mild pain, fever or headache.     [provider]  amLODipine (NORVASC) 10 MG tablet Take 1 tablet by mouth once daily 09/28/21   Adrian Prows, MD  aspirin EC 81 MG tablet Take 81 mg by mouth daily.    [provider]  carvedilol (COREG) 25 MG tablet Take 1/2 (one-half) tablet by mouth twice daily 07/02/21   Adrian Prows, MD  Cholecalciferol (VITAMIN D-3) 125 MCG (5000 UT) TABS Take 5,000 Units by mouth daily.    [provider]  doxycycline (VIBRAMYCIN) 100 MG capsule Take 100 mg by mouth 2 (two) times daily. 11/02/21   [provider]  empagliflozin (  JARDIANCE) 25 MG TABS tablet Take 1 tablet (25 mg total) by mouth daily before breakfast. 08/27/21   Adrian Prows, MD  esomeprazole (NEXIUM) 20 MG capsule Take 20 mg by mouth daily with breakfast.    [provider]  ezetimibe (ZETIA) 10 MG tablet TAKE 1 TABLET BY MOUTH ONCE DAILY AFTER SUPPER 07/20/21   Adrian Prows, MD  fexofenadine (ALLEGRA) 180 MG tablet Take 180 mg by mouth daily. 02/20/20   [provider]  fluticasone (FLONASE) 50 MCG/ACT nasal spray Place 1 spray into both nostrils daily as needed for allergies or  rhinitis.    [provider]  hydroxychloroquine (PLAQUENIL) 200 MG tablet Take 200 mg by mouth daily. 08/07/21   [provider]  Lancets (ONETOUCH DELICA PLUS [id number]) [place] SMARTSIG:1 Topical 3 Times Daily 08/01/19   [provider]  lidocaine-prilocaine (EMLA) cream Apply to the Port-A-Cath site 30 to 60 minutes before chemotherapy. 06/17/21   Heilingoetter, Cassandra L, PA-C  LIVALO 4 MG TABS TAKE 1 TABLET BY MOUTH AT BEDTIME 09/28/21   Adrian Prows, MD  metFORMIN (GLUCOPHAGE) 500 MG tablet Take 500 mg by mouth daily with supper.    [provider]  methocarbamol (ROBAXIN) 500 MG tablet Take 1 tablet (500 mg total) by mouth every 8 (eight) hours as needed for muscle spasms. 05/14/21   Meredith Pel, MD  montelukast (SINGULAIR) 10 MG tablet Take 10 mg by mouth daily as needed (allergies). 04/29/21   [provider]  nitroGLYCERIN (NITROSTAT) 0.4 MG SL tablet Place 1 tablet (0.4 mg total) under the tongue every 5 (five) minutes as needed for chest pain. Patient not taking: Reported on 10/20/2021 03/09/19   Adrian Prows, MD  [facility] ULTRA test strip USE 1 STRIP TO [place] DAILY 08/10/18   [provider]  Polyethyl Glycol-Propyl Glycol (SYSTANE) 0.4-0.3 % GEL ophthalmic gel Place 1 application. into both eyes every 6 (six) hours as needed (dry eyes).    [provider]  telmisartan (MICARDIS) 40 MG tablet Take 40 mg by mouth daily.    [provider]  vitamin E 180 MG (400 UNITS) capsule Take 400 IU daily x 1 week, then 400 IU BID Patient taking differently: Take 400 Units by mouth in the morning and at bedtime. 04/08/21   Eppie Gibson, MD                                                                                                                                    Past Surgical History Past Surgical History:  Procedure Laterality Date   ABDOMINAL HYSTERECTOMY     bone spur     CARDIAC CATHETERIZATION   06/13/2012   carpel tunnel surgery Left    COLONOSCOPY     9 years ago   CORONARY ANGIOPLASTY  06/13/2012   EYE SURGERY     cyst left eye    IR IMAGING GUIDED PORT  INSERTION  10/10/2018   LEFT HEART CATHETERIZATION WITH CORONARY ANGIOGRAM N/A 06/13/2012   Procedure: LEFT HEART CATHETERIZATION WITH CORONARY ANGIOGRAM;  Surgeon: Laverda Page, MD;  Location[facility] C[facility] LAB;  Service: Cardiovascular;  Laterality: N/A;   NECK SURGERY     rotator cuff surgery Right 2015   SHOULDER ARTHROSCOPY WITH ROTATOR CUFF REPAIR AND SUBACROMIAL DECOMPRESSION Left 05/14/2021   Procedure: LEFT SHOULDER ARTHROSCOPY, SUBACROMIAL DECOMPRESSION, BICEPS TENODESIS, MINI OPEN ROTATOR CUFF REPAIR;  Surgeon: Meredith Pel, MD;  Location: [facility];  Service: Orthopedics;  Laterality: Left;   VIDEO BRONCHOSCOPY WITH ENDOBRONCHIAL NAVIGATION N/A 09/06/2018   Procedure: VIDEO BRONCHOSCOPY WITH ENDOBRONCHIAL NAVIGATION, left upper lung;  Surgeon: Lajuana Matte, MD;  Location: [facility] OR;  Service: Thoracic;  Laterality: N/A;   VIDEO BRONCHOSCOPY WITH ENDOBRONCHIAL ULTRASOUND Left 09/06/2018   Procedure: VIDEO BRONCHOSCOPY WITH ENDOBRONCHIAL ULTRASOUND, left lung;  Surgeon: Lajuana Matte, MD;  Location: [facility] OR;  Service: Thoracic;  Laterality: Left;   WISDOM TOOTH EXTRACTION     Family History Family History  Problem Relation Age of Onset   Breast cancer Other    Diabetes Mother    Glaucoma Mother    Hypertension Mother    Hypertension Father    Asthma Father    Diabetes Sister    Diabetes Brother    Hypertension Sister     Social History Social History   Tobacco Use   Smoking status: Former    Packs/day: 0.50    Years: 30.00    Total pack years: 15.00    Types: Cigarettes    Quit date: 02/09/1999    Years since quitting: 22.8   Smokeless tobacco: Never  Vaping Use   Vaping Use: Never used  Substance Use Topics   Alcohol use: Not Currently   Drug use: Not Currently   Allergies Atorvastatin,  Crestor [rosuvastatin], Lisinopril, and Cephalexin  Review of Systems Review of Systems  Respiratory:  Positive for chest tightness.   Musculoskeletal:  Positive for arthralgias.    Physical Exam Vital Signs  I have reviewed the triage vital signs BP 137/71   Pulse 65   Temp 98.7 F (37.1 C) (Oral)   Resp 17   Ht 5\' 1"  (1.549 m)   Wt 84 kg   SpO2 99%   BMI 34.99 kg/m   Physical Exam Vitals and nursing note reviewed.  Constitutional:      General: She is not in acute distress.    Appearance: She is well-developed.  HENT:     Head: Normocephalic and atraumatic.  Eyes:     Conjunctiva/sclera: Conjunctivae normal.  Cardiovascular:     Rate and Rhythm: Normal rate and regular rhythm.     Heart sounds: No murmur heard. Pulmonary:     Effort: Pulmonary effort is normal. No respiratory distress.     Breath sounds: Normal breath sounds.  Abdominal:     Palpations: Abdomen is soft.     Tenderness: There is no abdominal tenderness.  Musculoskeletal:        General: Tenderness present. No swelling.     Cervical back: Neck supple.  Skin:    General: Skin is warm and dry.     Capillary Refill: Capillary refill takes less than 2 seconds.  Neurological:     Mental Status: She is alert.  Psychiatric:        Mood and Affect: Mood normal.     ED Results and Treatments Labs (all labs ordered are listed, but only abnormal  results are displayed) Labs Reviewed  BASIC METABOLIC PANEL - Abnormal; Notable for the following components:      Result Value   Creatinine, Ser 1.65 (*)    GFR, Estimated 34 (*)    All other components within normal limits  CBC - Abnormal; Notable for the following components:   RBC 5.44 (*)    MCH 24.4 (*)    RDW 16.5 (*)    All other components within normal limits  TROPONIN I (HIGH SENSITIVITY)  TROPONIN I (HIGH SENSITIVITY)                                                                                                                           Radiology DG Chest 2 View  Result Date: 12/18/2021 CLINICAL DATA:  Chest pain. EXAM: CHEST - 2 VIEW COMPARISON:  Chest radiograph dated 11/10/2020. FINDINGS: Right-sided Port-A-Cath in similar position. No focal consolidation, pleural effusion, or pneumothorax. The cardiac silhouette is within normal limits. Atherosclerotic calcification of the aorta. No acute osseous pathology. IMPRESSION: No active cardiopulmonary disease. Electronically Signed   By: Anner Crete M.D.   On: 12/18/2021 19:27    Pertinent labs & imaging results that were available during my care of the patient were reviewed by me and considered in my medical decision making (see MDM for details).  Medications Ordered in ED Medications - No data to display                                                                                                                                   Procedures Procedures  (including critical care time)  Medical Decision Making / ED Course   This patient presents to the ED for concern of chest pain, arm pain, this involves an extensive number of treatment options, and is a complaint that carries with it a high risk of complications and morbidity.  The differential diagnosis includes precordial catch, pneumonia, PE, ACS, malignancy  MDM: Patient seen in the emergency room for evaluation of chest and arm pain.  Physical exam with tenderness over the left arm near the site of her surgical incision but is otherwise unremarkable.  Laboratory evaluation with a creatinine of 1.65 but is otherwise unremarkable.  High-sensitivity troponin and delta troponin negative.  Chest x-ray unremarkable.  ECG nonischemic.  I have overall low suspicion for pulmonary embolism as the patient has no shortness of breath, tachycardia  and is low risk by Wells criteria.  Heart score is 3 and I have overall low suspicion for ACS.  Patient's presentation sounds like precordial catch given that this is a single  episode of fleeting but severe chest pain that lasted only seconds with no associated symptoms.  At this time, patient does not meet inpatient criteria for admission and is safe for discharge with outpatient follow-up.  She was given return precautions of which she voiced understanding and was discharged   Additional history obtained: -Additional history obtained from family member -External records from outside source obtained and reviewed including: Chart review including previous notes, labs, imaging, consultation notes   Lab Tests: -I ordered, reviewed, and interpreted labs.   The pertinent results include:   Labs Reviewed  BASIC METABOLIC PANEL - Abnormal; Notable for the following components:      Result Value   Creatinine, Ser 1.65 (*)    GFR, Estimated 34 (*)    All other components within normal limits  CBC - Abnormal; Notable for the following components:   RBC 5.44 (*)    MCH 24.4 (*)    RDW 16.5 (*)    All other components within normal limits  TROPONIN I (HIGH SENSITIVITY)  TROPONIN I (HIGH SENSITIVITY)      EKG   EKG Interpretation  Date/Time:  Friday December 18 2021 19:07:06 EST Ventricular Rate:  57 PR Interval:  150 QRS Duration: 72 QT Interval:  416 QTC Calculation: 404 R Axis:   61 Text Interpretation: Sinus bradycardia Abnormal ECG When compared with ECG of 28-Jan-2021 22:03, PREVIOUS ECG IS PRESENT Confirmed by [place] ([id number]) on 12/18/2021 11:37:51 PM         Imaging Studies ordered: I ordered imaging studies including chest x-ray I independently visualized and interpreted imaging. I agree with the radiologist interpretation   Medicines ordered and prescription drug management: No orders of the defined types were placed in this encounter.   -I have reviewed the patients home medicines and have made adjustments as needed  Critical interventions none    Cardiac Monitoring: The patient was maintained on a cardiac monitor.  I  personally viewed and interpreted the cardiac monitored which showed an underlying rhythm of: NSR  Social Determinants of Health:  Factors impacting patients care include: none   Reevaluation: After the interventions noted above, I reevaluated the patient and found that they have :improved  Co morbidities that complicate the patient evaluation  Past Medical History:  Diagnosis Date   Anginal pain ([place])    " with exertion "   Arthritis    " 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 "   Asthma    h/o   Brain metastases 08/2018   Chronic kidney disease    Coronary artery disease    Diabetes mellitus without complication (HCC)    Type II   GERD (gastroesophageal reflux disease)    H/O hiatal hernia    Heart murmur    History of radiation therapy 09/22/2018   stereotactic radiosurgery    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

## 2021-12-22 ENCOUNTER — Inpatient Hospital Stay: Payer: Medicare Other

## 2021-12-22 ENCOUNTER — Other Ambulatory Visit: Payer: Medicare Other

## 2021-12-22 ENCOUNTER — Inpatient Hospital Stay: Payer: Medicare Other | Attending: Physician Assistant | Admitting: Physician Assistant

## 2021-12-22 ENCOUNTER — Other Ambulatory Visit: Payer: Self-pay

## 2021-12-22 VITALS — BP 139/63 | HR 59 | Temp 98.2°F | Resp 17 | Wt 184.8 lb

## 2021-12-22 DIAGNOSIS — Z79899 Other long term (current) drug therapy: Secondary | ICD-10-CM | POA: Diagnosis not present

## 2021-12-22 DIAGNOSIS — C3412 Malignant neoplasm of upper lobe, left bronchus or lung: Secondary | ICD-10-CM | POA: Diagnosis present

## 2021-12-22 DIAGNOSIS — C3492 Malignant neoplasm of unspecified part of left bronchus or lung: Secondary | ICD-10-CM

## 2021-12-22 DIAGNOSIS — Z95828 Presence of other vascular implants and grafts: Secondary | ICD-10-CM

## 2021-12-22 DIAGNOSIS — Z5112 Encounter for antineoplastic immunotherapy: Secondary | ICD-10-CM | POA: Insufficient documentation

## 2021-12-22 DIAGNOSIS — R5383 Other fatigue: Secondary | ICD-10-CM

## 2021-12-22 DIAGNOSIS — C7931 Secondary malignant neoplasm of brain: Secondary | ICD-10-CM | POA: Diagnosis present

## 2021-12-22 LAB — CMP (CANCER CENTER ONLY)
ALT: 12 U/L (ref 0–44)
AST: 18 U/L (ref 15–41)
Albumin: 4.1 g/dL (ref 3.5–5.0)
Alkaline Phosphatase: 85 U/L (ref 38–126)
Anion gap: 6 (ref 5–15)
BUN: 18 mg/dL (ref 8–23)
CO2: 27 mmol/L (ref 22–32)
Calcium: 9.1 mg/dL (ref 8.9–10.3)
Chloride: 105 mmol/L (ref 98–111)
Creatinine: 1.7 mg/dL — ABNORMAL HIGH (ref 0.44–1.00)
GFR, Estimated: 32 mL/min — ABNORMAL LOW (ref >60)
Glucose, Bld: 172 mg/dL — ABNORMAL HIGH (ref 70–99)
Potassium: 4.1 mmol/L (ref 3.5–5.1)
Sodium: 138 mmol/L (ref 135–145)
Total Bilirubin: 0.4 mg/dL (ref 0.3–1.2)
Total Protein: 7.3 g/dL (ref 6.5–8.1)

## 2021-12-22 LAB — CBC WITH DIFFERENTIAL (CANCER CENTER ONLY)
Abs Immature Granulocytes: 0.01 10*3/uL (ref 0.00–0.07)
Basophils Absolute: 0 10*3/uL (ref 0.0–0.1)
Basophils Relative: 0 %
Eosinophils Absolute: 0.5 10*3/uL (ref 0.0–0.5)
Eosinophils Relative: 10 %
HCT: 38.8 % (ref 36.0–46.0)
Hemoglobin: 12.1 g/dL (ref 12.0–15.0)
Immature Granulocytes: 0 %
Lymphocytes Relative: 22 %
Lymphs Abs: 1.2 10*3/uL (ref 0.7–4.0)
MCH: 25 pg — ABNORMAL LOW (ref 26.0–34.0)
MCHC: 31.2 g/dL (ref 30.0–36.0)
MCV: 80.2 fL (ref 80.0–100.0)
Monocytes Absolute: 0.4 10*3/uL (ref 0.1–1.0)
Monocytes Relative: 8 %
Neutro Abs: 3.3 10*3/uL (ref 1.7–7.7)
Neutrophils Relative %: 60 %
Platelet Count: 233 10*3/uL (ref 150–400)
RBC: 4.84 MIL/uL (ref 3.87–5.11)
RDW: 16.3 % — ABNORMAL HIGH (ref 11.5–15.5)
WBC Count: 5.5 10*3/uL (ref 4.0–10.5)
nRBC: 0 % (ref 0.0–0.2)

## 2021-12-22 LAB — TSH: TSH: 1.97 u[IU]/mL (ref 0.350–4.500)

## 2021-12-22 MED ORDER — SODIUM CHLORIDE 0.9% FLUSH
10.0000 mL | INTRAVENOUS | Status: DC | PRN
  Administered 2021-12-22: 10 mL

## 2021-12-22 MED ORDER — SODIUM CHLORIDE 0.9 % IV SOLN: Freq: Once | INTRAVENOUS | Status: AC

## 2021-12-22 MED ORDER — SODIUM CHLORIDE 0.9 % IV SOLN
200.0000 mg | Freq: Once | INTRAVENOUS | Status: AC
  Administered 2021-12-22: 200 mg via INTRAVENOUS
  Filled 2021-12-22: qty 200

## 2021-12-22 MED ORDER — HEPARIN SOD (PORK) LOCK FLUSH 100 UNIT/ML IV SOLN
500.0000 [IU] | Freq: Once | INTRAVENOUS | Status: AC | PRN
  Administered 2021-12-22: 500 [IU]

## 2021-12-22 NOTE — Progress Notes (Signed)
Per Cassie PA, ok to treat with SCR 1.7 today.

## 2021-12-22 NOTE — Patient Instructions (Signed)
[facility]COL[facility]  Discharge Instructions: Thank you for choosing [facility] to provide your oncology and hematology care.   If you have a lab appointment with the [facility], please go directly to the [facility] and check in at the registration area.   Wear comfortable clothing and clothing appropriate for easy access to any Portacath or PICC line.   We strive to give you quality time with your provider. You may need to reschedule your appointment if you arrive late (15 or more minutes).  Arriving late affects you and other patients whose appointments are after yours.  Also, if you miss three or more appointments without notifying the office, you may be dismissed from the clinic at the provider's discretion.      For prescription refill requests, have your pharmacy contact our office and allow 72 hours for refills to be completed.    Today you received the following chemotherapy and/or immunotherapy agents: Keytruda.       To help prevent nausea and vomiting after your treatment, we encourage you to take your nausea medication as directed.  BELOW ARE SYMPTOMS THAT SHOULD BE REPORTED IMMEDIATELY: *FEVER GREATER THAN 100.4 F (38 C) OR HIGHER *CHILLS OR SWEATING *NAUSEA AND VOMITING THAT IS NOT CONTROLLED WITH YOUR NAUSEA MEDICATION *UNUSUAL SHORTNESS OF BREATH *UNUSUAL BRUISING OR BLEEDING *URINARY PROBLEMS (pain or burning when urinating, or frequent urination) *BOWEL PROBLEMS (unusual diarrhea, constipation, pain near the anus) TENDERNESS IN MOUTH AND THROAT WITH OR WITHOUT PRESENCE OF ULCERS (sore throat, sores in mouth, or a toothache) UNUSUAL RASH, SWELLING OR PAIN  UNUSUAL VAGINAL DISCHARGE OR ITCHING   Items with * indicate a potential emergency and should be followed up as soon as possible or go to the Emergency Department if any problems should occur.  Please show the CHEMOTHERAPY ALERT CARD or IMMUNOTHERAPY ALERT CARD at check-in to  the Emergency Department and triage nurse.  Should you have questions after your visit or need to cancel or reschedule your appointment, please contact [facility]  Dept: [phone]  and follow the prompts.  Office hours are 8:00 a.m. to 4:30 p.m. Monday - Friday. Please note that voicemails left after 4:00 p.m. may not be returned until the following business day.  We are closed weekends and major holidays. You have access to a nurse at all times for urgent questions. Please call the main number to the clinic Dept: [phone] and follow the prompts.   For any non-urgent questions, you may also contact your provider using MyChart. We now offer e-Visits for anyone 48 and older to request care online for non-urgent symptoms. For details visit [email or web address].   Also download the MyChart app! Go to the app store, search "MyChart", open the app, select [facility], and log in with your MyChart username and password.  Masks are optional in the cancer centers. If you would like for your care team to wear a mask while they are taking care of you, please let them know. You may have one support person who is at least 68 years old accompany you for your appointments.

## 2022-01-06 ENCOUNTER — Encounter: Payer: Self-pay | Admitting: Orthopedic Surgery

## 2022-01-06 ENCOUNTER — Ambulatory Visit (INDEPENDENT_AMBULATORY_CARE_PROVIDER_SITE_OTHER): Payer: Medicare Other | Admitting: Surgical

## 2022-01-06 DIAGNOSIS — M75122 Complete rotator cuff tear or rupture of left shoulder, not specified as traumatic: Secondary | ICD-10-CM

## 2022-01-06 DIAGNOSIS — I25118 Atherosclerotic heart disease of native coronary artery with other forms of angina pectoris: Secondary | ICD-10-CM

## 2022-01-06 MED ORDER — METHYLPREDNISOLONE ACETATE 40 MG/ML IJ SUSP
40.0000 mg | INTRAMUSCULAR | Status: AC | PRN
  Administered 2022-01-06: 40 mg via INTRA_ARTICULAR

## 2022-01-06 MED ORDER — BUPIVACAINE HCL 0.5 % IJ SOLN
9.0000 mL | INTRAMUSCULAR | Status: AC | PRN
  Administered 2022-01-06: 9 mL via INTRA_ARTICULAR

## 2022-01-06 MED ORDER — LIDOCAINE HCL 1 % IJ SOLN
5.0000 mL | INTRAMUSCULAR | Status: AC | PRN
  Administered 2022-01-06: 5 mL

## 2022-01-06 NOTE — Progress Notes (Signed)
Office Visit Note   Patient: Carolyn [name]           Date of Birth: 09/11/1955           MRN: [id number] Visit Date: 01/06/2022 Requested by: [name], [place],   [id number] PCP: [name], FNP  Subjective: Chief Complaint  Patient presents with   Left Shoulder - Pain    HPI: Carolyn [name] is a 66 y.o. female who presents to the office reporting left shoulder pain.  Patient is s/p left shoulder arthroscopy with bicep tenodesis and mini open rotator cuff tear repair on 05/14/2021.  States that pain never really resolved after surgery.  She has constant pain that she localizes to the lateral aspect of the left shoulder.  No radiation of pain.  No numbness or tingling, neck pain, scapular pain.  Worse with laying on her left side and with lifting her arm up overhead.  She is able to fully lift her arm overhead though she does have some pain.  She also feels weak with lifting her arm to take a coffee cup out of her cabinet at home.  She is retired and does not do a lot of physically demanding activities.  Takes Tylenol with some relief but cannot take NSAIDs.  Pain does not wake her from sleep as long as she avoids laying on that left shoulder..                ROS: All systems reviewed are negative as they relate to the chief complaint within the history of present illness.  Patient denies fevers or chills.  Assessment & Plan: Visit Diagnoses:  1. Complete tear of left rotator cuff, unspecified whether traumatic     Plan: Patient is a 66 year old female who presents s/p left shoulder arthroscopy with bicep tenodesis and rotator cuff tear repair on 05/14/2021.  She is here with persistent pain in the left shoulder following surgery that never really improved.  Denies any history of injury or event after surgery.  She had large full with full-thickness tears of the supra and infra with 2.5 cm retraction without muscle atrophy.  She  has good function of the shoulder on exam today but there is some coarse crepitus noted with passive motion of the shoulder primarily in the anterior aspect of the shoulder.  She has demonstrable weakness primarily of external rotation but also of supraspinatus.  Ultrasound examination today demonstrates what appears to be full-thickness retear of the supraspinatus with anechoic fluid noted surrounding suture material.  Discussed the options available patient.  She states that she would like to hold off on any surgical intervention as she does not want to go through shoulder surgery again.  After discussion, she would like to try subacromial injection to give symptomatic relief.  She understands this will not help significantly with the weakness that she has in her daily life.  Tolerated injection well.  Follow-up with the office as needed.  Follow-Up Instructions: No follow-ups on file.   Orders:  No orders of the defined types were placed in this encounter.  No orders of the defined types were placed in this encounter.     Procedures: Large Joint Inj: L subacromial bursa on 01/06/2022 9:32 AM Indications: diagnostic evaluation and pain Details: 18 G 1.5 in needle, posterior approach  Arthrogram: No  Medications: 9 mL bupivacaine 0.5 %; 40 mg methylPREDNISolone acetate 40 MG/ML; 5 mL lidocaine 1 % Outcome:  tolerated well, no immediate complications Procedure, treatment alternatives, risks and benefits explained, specific risks discussed. Consent was given by the patient. Immediately prior to procedure a time out was called to verify the correct patient, procedure, equipment, support staff and site/side marked as required. Patient was prepped and draped in the usual sterile fashion.       Clinical Data: No additional findings.  Objective: Vital Signs: There were no vitals taken for this visit.  Physical Exam:  Constitutional: Patient appears well-developed HEENT:  Head:  Normocephalic Eyes:EOM are normal Neck: Normal range of motion Cardiovascular: Normal rate Pulmonary/chest: Effort normal Neurologic: Patient is alert Skin: Skin is warm Psychiatric: Patient has normal mood and affect  Ortho Exam: Ortho exam demonstrates left shoulder with 40 degrees external rotation, 80 degrees abduction, 140 degrees forward flexion.  Intact EPL, FPL, finger abduction, finger adduction, pronation/supination, bicep, tricep, deltoid.  Axillary nerve is intact with deltoid firing.  5 -/5 supraspinatus strength of the left shoulder.  4/5 external rotation strength.  5/5 subscapularis strength.  No Popeye deformity is noticeable.  No tenderness over the bicipital groove.  No reproduction of pain with supination/bicep flexion.  She has full active range of motion equivalent to passive range of motion.  Incisions are well-healed from prior surgery.  No tenderness throughout the axial cervical spine.  No pain with cervical spine range of motion.  Negative Lhermitte sign.  Negative Spurling sign.  Specialty Comments:  No specialty comments available.  Imaging: No results found.   PMFS History: Patient Active Problem List   Diagnosis Date Noted   Complete tear of left rotator cuff    Biceps tendonitis, left    Degenerative superior labral anterior-to-posterior (SLAP) tear of left shoulder    Elevated serum creatinine 04/12/2019   Pyelonephritis 02/08/2019   Port-A-Cath in place 10/19/2018   Encounter for antineoplastic chemotherapy 09/28/2018   Encounter for antineoplastic immunotherapy 09/28/2018   Goals of care, counseling/discussion 09/28/2018   Adenocarcinoma, lung ([place]) 09/12/2018   Malignant neoplasm metastatic to brain [facility]) 08/30/2018   Mass of upper lobe of left lung 08/17/2018   Angina pectoris ([place]) 06/14/2012   CAD (coronary artery disease), native coronary artery 06/14/2012   S/P PTCA (percutaneous transluminal coronary angioplasty) 06/14/2012    Hyperlipidemia 06/14/2012   Essential hypertension 06/14/2012   Pre-diabetes 06/14/2012   Past Medical History:  Diagnosis Date   Anginal pain ([place])    " with exertion "   Arthritis    " 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 "   Asthma    h/o   Brain metastases 08/2018   Chronic kidney disease    Coronary artery disease    Diabetes mellitus without complication (HCC)    Type II   GERD (gastroesophageal reflux disease)    H/O hiatal hernia    Heart murmur    History of radiation therapy 09/22/2018   stereotactic radiosurgery    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   Hypertension    nscl ca dx'd 08/07/18   Persistent headaches    h/o   Pneumonia    hx of PNA   Shortness of breath    Vaginal dryness    h/o    Family History  Problem Relation Age of Onset   Breast cancer Other    Diabetes Mother    Glaucoma Mother    Hypertension Mother    Hypertension Father    Asthma Father    Diabetes Sister    Diabetes Brother    Hypertension  Sister     Past Surgical History:  Procedure Laterality Date   ABDOMINAL HYSTERECTOMY     bone spur     CARDIAC CATHETERIZATION  06/13/2012   carpel tunnel surgery Left    COLONOSCOPY     9 years ago   CORONARY ANGIOPLASTY  06/13/2012   EYE SURGERY     cyst left eye    IR IMAGING GUIDED PORT INSERTION  10/10/2018   LEFT HEART CATHETERIZATION WITH CORONARY ANGIOGRAM N/A 06/13/2012   Procedure: LEFT HEART CATHETERIZATION WITH CORONARY ANGIOGRAM;  Surgeon: Laverda Page, MD;  Location: [facility] CATH LAB;  Service: Cardiovascular;  Laterality: N/A;   NECK SURGERY     rotator cuff surgery Right 2015   SHOULDER ARTHROSCOPY WITH ROTATOR CUFF REPAIR AND SUBACROMIAL DECOMPRESSION Left 05/14/2021   Procedure: LEFT SHOULDER ARTHROSCOPY, SUBACROMIAL DECOMPRESSION, BICEPS TENODESIS, MINI OPEN ROTATOR CUFF REPAIR;  Surgeon: Meredith Pel, MD;  Location: [facility];  Service: Orthopedics;  Laterality: Left;   VIDEO BRONCHOSCOPY WITH ENDOBRONCHIAL NAVIGATION N/A 09/06/2018   Procedure: VIDEO  BRONCHOSCOPY WITH ENDOBRONCHIAL NAVIGATION, left upper lung;  Surgeon: Lajuana Matte, MD;  Location[facility];  Service: Thoracic;  Laterality: N/A;   VIDEO BRONCHOSCOPY WITH ENDOBRONCHIAL ULTRASOUND Left 09/06/2018   Procedure: VIDEO BRONCHOSCOPY WITH ENDOBRONCHIAL ULTRASOUND, left lung;  Surgeon: Lajuana Matte, MD;  Location: [facility] OR;  Service: Thoracic;  Laterality: Left;   WISDOM TOOTH EXTRACTION     Social History   Occupational History   Not on file  Tobacco Use   Smoking status: Former    Packs/day: 0.50    Years: 30.00    Total pack years: 15.00    Types: Cigarettes    Quit date: 02/09/1999    Years since quitting: 22.9   Smokeless tobacco: Never  Vaping Use   Vaping Use: Never used  Substance and Sexual Activity   Alcohol use: Not Currently   Drug use: Not Currently   Sexual activity: Yes    Birth control/protection: Post-menopausal

## 2022-01-10 ENCOUNTER — Other Ambulatory Visit: Payer: Self-pay | Admitting: Cardiology

## 2022-01-11 ENCOUNTER — Telehealth: Payer: Self-pay | Admitting: Internal Medicine

## 2022-01-11 ENCOUNTER — Other Ambulatory Visit: Payer: Self-pay

## 2022-01-11 DIAGNOSIS — M359 Systemic involvement of connective tissue, unspecified: Secondary | ICD-10-CM | POA: Diagnosis not present

## 2022-01-11 DIAGNOSIS — M79642 Pain in left hand: Secondary | ICD-10-CM | POA: Diagnosis not present

## 2022-01-11 DIAGNOSIS — M1990 Unspecified osteoarthritis, unspecified site: Secondary | ICD-10-CM | POA: Diagnosis not present

## 2022-01-11 DIAGNOSIS — R768 Other specified abnormal immunological findings in serum: Secondary | ICD-10-CM | POA: Diagnosis not present

## 2022-01-11 DIAGNOSIS — N1831 Chronic kidney disease, stage 3a: Secondary | ICD-10-CM | POA: Diagnosis not present

## 2022-01-11 DIAGNOSIS — M7989 Other specified soft tissue disorders: Secondary | ICD-10-CM | POA: Diagnosis not present

## 2022-01-11 DIAGNOSIS — Z79899 Other long term (current) drug therapy: Secondary | ICD-10-CM | POA: Diagnosis not present

## 2022-01-11 DIAGNOSIS — C3490 Malignant neoplasm of unspecified part of unspecified bronchus or lung: Secondary | ICD-10-CM | POA: Diagnosis not present

## 2022-01-11 NOTE — Telephone Encounter (Signed)
Called patient regarding upcoming December appointments, patient is notified. 

## 2022-01-12 ENCOUNTER — Other Ambulatory Visit: Payer: Medicare Other

## 2022-01-12 ENCOUNTER — Inpatient Hospital Stay: Payer: Medicare Other | Attending: Physician Assistant

## 2022-01-12 ENCOUNTER — Encounter: Payer: Self-pay | Admitting: Internal Medicine

## 2022-01-12 ENCOUNTER — Inpatient Hospital Stay (HOSPITAL_BASED_OUTPATIENT_CLINIC_OR_DEPARTMENT_OTHER): Payer: Medicare Other | Admitting: Internal Medicine

## 2022-01-12 ENCOUNTER — Inpatient Hospital Stay: Payer: Medicare Other

## 2022-01-12 ENCOUNTER — Other Ambulatory Visit: Payer: Self-pay

## 2022-01-12 VITALS — BP 135/64 | HR 62 | Temp 98.3°F | Resp 15 | Ht 61.0 in | Wt 185.5 lb

## 2022-01-12 DIAGNOSIS — C3492 Malignant neoplasm of unspecified part of left bronchus or lung: Secondary | ICD-10-CM

## 2022-01-12 DIAGNOSIS — C3412 Malignant neoplasm of upper lobe, left bronchus or lung: Secondary | ICD-10-CM | POA: Diagnosis not present

## 2022-01-12 DIAGNOSIS — Z79899 Other long term (current) drug therapy: Secondary | ICD-10-CM | POA: Insufficient documentation

## 2022-01-12 DIAGNOSIS — Z95828 Presence of other vascular implants and grafts: Secondary | ICD-10-CM

## 2022-01-12 DIAGNOSIS — R5383 Other fatigue: Secondary | ICD-10-CM

## 2022-01-12 DIAGNOSIS — C7931 Secondary malignant neoplasm of brain: Secondary | ICD-10-CM | POA: Insufficient documentation

## 2022-01-12 DIAGNOSIS — Z5112 Encounter for antineoplastic immunotherapy: Secondary | ICD-10-CM | POA: Diagnosis present

## 2022-01-12 LAB — CBC WITH DIFFERENTIAL (CANCER CENTER ONLY)
Abs Immature Granulocytes: 0.02 10*3/uL (ref 0.00–0.07)
Basophils Absolute: 0 10*3/uL (ref 0.0–0.1)
Basophils Relative: 0 %
Eosinophils Absolute: 0.4 10*3/uL (ref 0.0–0.5)
Eosinophils Relative: 4 %
HCT: 40.5 % (ref 36.0–46.0)
Hemoglobin: 12.6 g/dL (ref 12.0–15.0)
Immature Granulocytes: 0 %
Lymphocytes Relative: 16 %
Lymphs Abs: 1.4 10*3/uL (ref 0.7–4.0)
MCH: 25 pg — ABNORMAL LOW (ref 26.0–34.0)
MCHC: 31.1 g/dL (ref 30.0–36.0)
MCV: 80.2 fL (ref 80.0–100.0)
Monocytes Absolute: 0.7 10*3/uL (ref 0.1–1.0)
Monocytes Relative: 7 %
Neutro Abs: 6.5 10*3/uL (ref 1.7–7.7)
Neutrophils Relative %: 73 %
Platelet Count: 272 10*3/uL (ref 150–400)
RBC: 5.05 MIL/uL (ref 3.87–5.11)
RDW: 16.6 % — ABNORMAL HIGH (ref 11.5–15.5)
WBC Count: 9 10*3/uL (ref 4.0–10.5)
nRBC: 0 % (ref 0.0–0.2)

## 2022-01-12 LAB — CMP (CANCER CENTER ONLY)
ALT: 12 U/L (ref 0–44)
AST: 15 U/L (ref 15–41)
Albumin: 3.9 g/dL (ref 3.5–5.0)
Alkaline Phosphatase: 78 U/L (ref 38–126)
Anion gap: 8 (ref 5–15)
BUN: 23 mg/dL (ref 8–23)
CO2: 27 mmol/L (ref 22–32)
Calcium: 9.1 mg/dL (ref 8.9–10.3)
Chloride: 105 mmol/L (ref 98–111)
Creatinine: 1.48 mg/dL — ABNORMAL HIGH (ref 0.44–1.00)
GFR, Estimated: 38 mL/min — ABNORMAL LOW (ref >60)
Glucose, Bld: 151 mg/dL — ABNORMAL HIGH (ref 70–99)
Potassium: 4.3 mmol/L (ref 3.5–5.1)
Sodium: 140 mmol/L (ref 135–145)
Total Bilirubin: 0.3 mg/dL (ref 0.3–1.2)
Total Protein: 7 g/dL (ref 6.5–8.1)

## 2022-01-12 LAB — TSH: TSH: 2.444 u[IU]/mL (ref 0.350–4.500)

## 2022-01-12 MED ORDER — SODIUM CHLORIDE 0.9% FLUSH
10.0000 mL | INTRAVENOUS | Status: DC | PRN
  Administered 2022-01-12: 10 mL

## 2022-01-12 MED ORDER — SODIUM CHLORIDE 0.9 % IV SOLN: Freq: Once | INTRAVENOUS | Status: AC

## 2022-01-12 MED ORDER — SODIUM CHLORIDE 0.9 % IV SOLN
200.0000 mg | Freq: Once | INTRAVENOUS | Status: AC
  Administered 2022-01-12: 200 mg via INTRAVENOUS
  Filled 2022-01-12: qty 200

## 2022-01-12 MED ORDER — HEPARIN SOD (PORK) LOCK FLUSH 100 UNIT/ML IV SOLN
500.0000 [IU] | Freq: Once | INTRAVENOUS | Status: AC | PRN
  Administered 2022-01-12: 500 [IU]

## 2022-01-12 NOTE — Progress Notes (Signed)
[facility] [phone]   [phone]  OFFICE PROGRESS NOTE  [name], [place] [address]  DIAGNOSIS: Stage IV (T1c, N1, M1c ) non-small cell lung cancer, adenocarcinoma diagnosed in July 2020 and presented with left upper lobe lung nodule in addition to right upper lobe lung nodule with left hilar lymphadenopathy as well as multiple brain metastasis.  Biomarker Findings Microsatellite status - MS-Stable Tumor Mutational Burden - 4 Muts/Mb Genomic Findings For a complete list of the genes assayed, please refer to the Appendix. ATM G204* KRAS G12C PDGFRA P122T SMO R199W 7 Disease relevant genes with no reportable alterations: ALK, BRAF, EGFR, ERBB2, MET, RET, ROS1  PDL 1 expression was 1%  PRIOR THERAPY:  1) SRS to multiple brain metastasis under the care of Dr. Isidore Moos. 2) SRS to a 4 mm new brain metastasis under the care of Dr. Isidore Moos and Dr. Saintclair Halsted on 09/02/2020  CURRENT THERAPY: Systemic chemotherapy with carboplatin for AUC of 5, Alimta 500 mg/M2 and Keytruda 200 mg IV every 3 weeks.  First dose October 05, 2018.  Status post 57 cycles.  Starting from cycle #5 the patient will be on treatment with maintenance Alimta 500 mg/M2 and Keytruda 200 mg IV every 3 weeks.  She has been on treatment with single agent Keytruda starting from cycle #9 secondary to renal insufficiency.  INTERVAL HISTORY: Carolyn [name] 66 y.o. female returns to the clinic today for follow-up visit.  The patient is feeling fine today with no concerning complaints.  She denied having any current chest pain, shortness of breath, cough or hemoptysis.  She has no nausea, vomiting, diarrhea or constipation.  She denied having any headache or visual changes.  She has no recent weight loss or night sweats.  She continues to tolerate her treatment with Keytruda fairly well.  The patient is here today for evaluation before starting  cycle #58.   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  MEDICATIONS:  Current Outpatient Medications  Medication Sig Dispense Refill   acetaminophen (TYLENOL) 500 MG tablet Take 1,000 mg by mouth every 8 (eight) hours as needed for mild pain, fever or headache.      amLODipine (NORVASC) 10 MG tablet Take 1 tablet by mouth once daily 90 tablet 0   aspirin EC 81 MG tablet Take 81 mg by mouth daily.     carvedilol (COREG) 25 MG tablet Take 1/2 (one-half) tablet by mouth twice daily 180 tablet 2   Cholecalciferol (VITAMIN D-3) 125 MCG (5000 UT) TABS Take 5,000 Units  by mouth daily.     doxycycline (VIBRAMYCIN) 100 MG capsule Take 100 mg by mouth 2 (two) times daily.     empagliflozin (JARDIANCE) 25 MG TABS tablet Take 1 tablet (25 mg total) by mouth daily before breakfast. 90 tablet 3   esomeprazole (NEXIUM) 20 MG capsule Take 20 mg by mouth daily with breakfast.     ezetimibe (ZETIA) 10 MG tablet TAKE 1 TABLET BY MOUTH ONCE DAILY AFTER SUPPER 90 tablet 0   fexofenadine (ALLEGRA) 180 MG tablet Take 180 mg by mouth daily.     fluticasone (FLONASE) 50 MCG/ACT nasal spray Place 1 spray into both nostrils daily as needed for allergies or rhinitis.     hydroxychloroquine (PLAQUENIL) 200 MG tablet Take 200 mg by mouth daily.      Lancets (ONETOUCH DELICA PLUS [id number]) MISC SMARTSIG:1 Topical 3 Times Daily     lidocaine-prilocaine (EMLA) cream Apply to the Port-A-Cath site 30 to 60 minutes before chemotherapy. 30 g 2   LIVALO 4 MG TABS TAKE 1 TABLET BY MOUTH AT BEDTIME 90 tablet 0   metFORMIN (GLUCOPHAGE) 500 MG tablet Take 500 mg by mouth daily with supper.     methocarbamol (ROBAXIN) 500 MG tablet Take 1 tablet (500 mg total) by mouth every 8 (eight) hours as needed for muscle spasms. 30 tablet 0   montelukast (SINGULAIR) 10 MG tablet Take 10 mg by mouth daily as needed (allergies).     nitroGLYCERIN (NITROSTAT) 0.4 MG SL tablet Place 1 tablet (0.4 mg total) under the tongue every 5 (five) minutes as needed for chest pain. (Patient not taking: Reported on 10/20/2021) 25 tablet 2   ONETOUCH ULTRA test strip USE 1 STRIP TO CHECK GLUCOSE THREE TIMES DAILY     Polyethyl Glycol-Propyl Glycol (SYSTANE) 0.4-0.3 % GEL ophthalmic gel Place 1 application. into both eyes every 6 (six) hours as needed (dry eyes).     telmisartan (MICARDIS) 40 MG tablet Take 40 mg by mouth daily.     vitamin E 180 MG (400 UNITS) capsule Take 400 IU daily x 1 week, then 400 IU BID (Patient taking differently: Take 400 Units by mouth in the morning and at bedtime.) 60 capsule 5   No current facility-administered medications for this visit.    SURGICAL HISTORY:  Past Surgical History:  Procedure Laterality Date   ABDOMINAL HYSTERECTOMY     bone spur     CARDIAC CATHETERIZATION  06/13/2012   carpel tunnel surgery Left    COLONOSCOPY     9 years ago   CORONARY ANGIOPLASTY  06/13/2012   EYE SURGERY     cyst left eye    IR IMAGING GUIDED PORT INSERTION  10/10/2018   LEFT HEART CATHETERIZATION WITH CORONARY ANGIOGRAM N/A 06/13/2012   Procedure: LEFT HEART CATHETERIZATION WITH CORONARY ANGIOGRAM;  Surgeon: Laverda Page, MD;  Location: [facility] CATH LAB;  Service: Cardiovascular;  Laterality: N/A;   NECK SURGERY     rotator cuff surgery Right 2015    SHOULDER ARTHROSCOPY WITH ROTATOR CUFF REPAIR AND SUBACROMIAL DECOMPRESSION Left 05/14/2021   Procedure: LEFT SHOULDER ARTHROSCOPY, SUBACROMIAL DECOMPRESSION, BICEPS TENODESIS, MINI OPEN ROTATOR CUFF REPAIR;  Surgeon: Meredith Pel, MD;  Location: [facility];  Service: Orthopedics;  Laterality: Left;   VIDEO BRONCHOSCOPY WITH ENDOBRONCHIAL NAVIGATION N/A 09/06/2018   Procedure: VIDEO BRONCHOSCOPY WITH ENDOBRONCHIAL NAVIGATION, left upper lung;  Surgeon: Lajuana Matte, MD;  [facility];  Service: Thoracic;  Laterality: N/A;   VIDEO BRONCHOSCOPY WITH ENDOBRONCHIAL ULTRASOUND Left  09/06/2018   Procedure: VIDEO BRONCHOSCOPY WITH ENDOBRONCHIAL ULTRASOUND, left lung;  Surgeon: Lajuana Matte, MD;  Location: [facility] OR;  Service: Thoracic;  Laterality: Left;   WISDOM TOOTH EXTRACTION      REVIEW OF SYSTEMS:  A comprehensive review of systems was negative except for: Constitutional: positive for fatigue   PHYSICAL EXAMINATION: General appearance: alert, cooperative, and no distress Head: Normocephalic, without obvious abnormality, atraumatic Neck: no adenopathy, no JVD, supple, symmetrical, trachea midline, and thyroid not enlarged, symmetric, no tenderness/mass/nodules Lymph nodes: Cervical, supraclavicular, and axillary nodes normal. Resp: clear to auscultation bilaterally Back: symmetric, no curvature. ROM normal. No CVA tenderness. Cardio: regular rate and rhythm, S1, S2 normal, no murmur, click, rub or gallop GI: soft, non-tender; bowel sounds normal; no masses,  no organomegaly Extremities: extremities normal, atraumatic, no cyanosis or edema  ECOG PERFORMANCE STATUS: 1 - Symptomatic but completely ambulatory  Blood pressure 135/64, pulse 62, temperature 98.3 F (36.8 C), temperature source Oral, resp. rate 15, height _0  (1.549 m), weight 185 lb 8 oz (84.1 kg), SpO2 100 %.  LABORATORY DATA: Lab Results  Component Value Date   WBC 9.0 01/12/2022   HGB 12.6 01/12/2022   HCT  40.5 01/12/2022   MCV 80.2 01/12/2022   PLT 272 01/12/2022      Chemistry      Component Value Date/Time   NA 138 12/22/2021 0948   NA 139 08/18/2020 0801   K 4.1 12/22/2021 0948   CL 105 12/22/2021 0948   CO2 27 12/22/2021 0948   BUN 18 12/22/2021 0948   BUN 16 08/18/2020 0801   CREATININE 1.70 (H) 12/22/2021 0948      Component Value Date/Time   CALCIUM 9.1 12/22/2021 0948   ALKPHOS 85 12/22/2021 0948   AST 18 12/22/2021 0948   ALT 12 12/22/2021 0948   BILITOT 0.4 12/22/2021 0948       RADIOGRAPHIC STUDIES: DG Chest 2 View  Result Date: 12/18/2021 CLINICAL DATA:  Chest pain. EXAM: CHEST - 2 VIEW COMPARISON:  Chest radiograph dated 11/10/2020. FINDINGS: Right-sided Port-A-Cath in similar position. No focal consolidation, pleural effusion, or pneumothorax. The cardiac silhouette is within normal limits. Atherosclerotic calcification of the aorta. No acute osseous pathology. IMPRESSION: No active cardiopulmonary disease. Electronically Signed   By: Anner Crete M.D.   On: 12/18/2021 19:27     ASSESSMENT AND PLAN: This is a very pleasant 66 years old African-American female with likely stage IV (T1c, N1, M1C) non-small cell lung cancer, adenocarcinoma with positive KRAS G12C mutation diagnosed in July 2020 and presented with left upper lobe lung nodule in addition to right upper lobe pulmonary nodule as well as left hilar adenopathy and metastatic lesion to the brain. Molecular studies showed no actionable mutation and PDL 1 expression was 1%. The patient underwent SBRT to the metastatic brain lesion under the care of Dr. Isidore Moos. The patient is currently undergoing systemic chemotherapy with carboplatin for AUC of 5, Alimta 500 mg/M2 and Keytruda 200 mg IV every 3 weeks is status post 57 cycles.  Starting from cycle #5 she is on maintenance treatment with Alimta and Keytruda every 3 weeks with only Keytruda on the last few cycles because of the renal insufficiency. The patient  has been tolerating her treatment with Keytruda fairly well with no concerning adverse effects. I recommended for her to proceed with cycle #58 today as planned. I will see her back for follow-up visit in 3 weeks for evaluation before starting the next cycle of  her treatment. For the renal insufficiency she is followed by nephrology. The patient was advised to call immediately if she has any other concerning symptoms in the interval. The patient voices understanding of current disease status and treatment options and is in agreement with the current care plan. All questions were answered. The patient knows to call the clinic with any problems, questions or concerns. We can certainly see the patient much sooner if necessary.  Disclaimer: This note was dictated with voice recognition software. Similar sounding words can inadvertently be transcribed and may not be corrected upon review.

## 2022-01-12 NOTE — Patient Instructions (Signed)
[facility]COL[facility]  Discharge Instructions: Thank you for choosing [facility] to provide your oncology and hematology care.   If you have a lab appointment with the [facility], please go directly to the [facility] and check in at the registration area.   Wear comfortable clothing and clothing appropriate for easy access to any Portacath or PICC line.   We strive to give you quality time with your provider. You may need to reschedule your appointment if you arrive late (15 or more minutes).  Arriving late affects you and other patients whose appointments are after yours.  Also, if you miss three or more appointments without notifying the office, you may be dismissed from the clinic at the provider's discretion.      For prescription refill requests, have your pharmacy contact our office and allow 72 hours for refills to be completed.    Today you received the following chemotherapy and/or immunotherapy agents Keytruda      To help prevent nausea and vomiting after your treatment, we encourage you to take your nausea medication as directed.  BELOW ARE SYMPTOMS THAT SHOULD BE REPORTED IMMEDIATELY: *FEVER GREATER THAN 100.4 F (38 C) OR HIGHER *CHILLS OR SWEATING *NAUSEA AND VOMITING THAT IS NOT CONTROLLED WITH YOUR NAUSEA MEDICATION *UNUSUAL SHORTNESS OF BREATH *UNUSUAL BRUISING OR BLEEDING *URINARY PROBLEMS (pain or burning when urinating, or frequent urination) *BOWEL PROBLEMS (unusual diarrhea, constipation, pain near the anus) TENDERNESS IN MOUTH AND THROAT WITH OR WITHOUT PRESENCE OF ULCERS (sore throat, sores in mouth, or a toothache) UNUSUAL RASH, SWELLING OR PAIN  UNUSUAL VAGINAL DISCHARGE OR ITCHING   Items with * indicate a potential emergency and should be followed up as soon as possible or go to the Emergency Department if any problems should occur.  Please show the CHEMOTHERAPY ALERT CARD or IMMUNOTHERAPY ALERT CARD at check-in to  the Emergency Department and triage nurse.  Should you have questions after your visit or need to cancel or reschedule your appointment, please contact [facility]  Dept: [phone]  and follow the prompts.  Office hours are 8:00 a.m. to 4:30 p.m. Monday - Friday. Please note that voicemails left after 4:00 p.m. may not be returned until the following business day.  We are closed weekends and major holidays. You have access to a nurse at all times for urgent questions. Please call the main number to the clinic Dept: [phone] and follow the prompts.   For any non-urgent questions, you may also contact your provider using MyChart. We now offer e-Visits for anyone 17 and older to request care online for non-urgent symptoms. For details visit [email or web address].   Also download the MyChart app! Go to the app store, search "MyChart", open the app, select [facility], and log in with your MyChart username and password.  Masks are optional in the cancer centers. If you would like for your care team to wear a mask while they are taking care of you, please let them know. You may have one support person who is at least 68 years old accompany you for your appointments.

## 2022-01-12 NOTE — Addendum Note (Signed)
Addended by: Ardeen Garland on: 01/12/2022 08:50 AM   Modules accepted: Orders

## 2022-01-12 NOTE — Progress Notes (Signed)
Per Dr Julien Nordmann ,it is okay to treat pt today with Keytruda and creatinine of 1.48.

## 2022-01-29 NOTE — Progress Notes (Unsigned)
[facility] OFFICE PROGRESS NOTE  [name], [place] [address]  DIAGNOSIS: Stage IV (T1c, N1, M1c ) non-small cell lung cancer, adenocarcinoma diagnosed in July 2020 and presented with left upper lobe lung nodule in addition to right upper lobe lung nodule with left hilar lymphadenopathy as well as multiple brain metastasis.   Biomarker Findings Microsatellite status - MS-Stable Tumor Mutational Burden - 4 Muts/Mb Genomic Findings For a complete list of the genes assayed, please refer to the Appendix. ATM G204* KRAS G12C PDGFRA P122T SMO R199W 7 Disease relevant genes with no reportable alterations: ALK, BRAF, EGFR, ERBB2, MET, RET, ROS1   PDL 1 expression was 1%  PRIOR THERAPY: 1) SBRT to multiple brain metastasis under the care of Dr. Isidore Moos. Completed in August 2020 2) SRS to the new metastatic brain lesions under the care of Dr. Saintclair Halsted and Dr. Isidore Moos on 09/02/20 and 12/16/20  CURRENT THERAPY:  Systemic chemotherapy with carboplatin for AUC of 5, Alimta 500 mg/M2 and Keytruda 200 mg IV every 3 weeks.  First dose October 05, 2018.  Status post 58 cycles.  Starting from cycle #5 the patient will be on treatment with maintenance Alimta 500 mg/M2 and Keytruda 200 mg IV every 3 weeks.  She has been on treatment with single agent Keytruda secondary to renal insufficiency   INTERVAL HISTORY: Carolyn [name] 66 y.o. female returns to the clinic today for a follow-up visit. The patient is feeling fairly well today without any concerning complaints. She is going to try to go to the [facility] for pool exercises. She denies any fever, chills, or weight loss. Denies any chest pain, shortness of breath, cough, or hemoptysis.  Denies any nausea, vomiting, diarrhea.  She sometimes has occasional constipation for which she takes over-the-counter laxatives.  Denies any rashes or skin changes. Denies any headache or visual changes.  She is scheduled  for repeat brain MRI in January 2024.  She is here today for evaluation and repeat blood work before starting cycle #59.     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  MEDICATIONS:  Current Outpatient Medications  Medication Sig Dispense Refill   acetaminophen (TYLENOL) 500 MG tablet Take 1,000 mg by mouth every 8 (eight) hours as needed for mild pain, fever or headache.      amLODipine (NORVASC) 10 MG tablet Take 1 tablet by mouth once daily 90 tablet 0   aspirin EC 81 MG tablet Take 81 mg by mouth daily.     carvedilol (COREG) 25 MG tablet Take 1/2 (one-half) tablet by  mouth twice daily 180 tablet 2   Cholecalciferol (VITAMIN D-3) 125 MCG (5000 UT) TABS Take 5,000 Units by mouth daily.     empagliflozin (JARDIANCE) 25 MG TABS tablet Take 1 tablet (25 mg total) by mouth daily before breakfast. 90 tablet 3   esomeprazole (NEXIUM) 20 MG capsule Take 20 mg by mouth daily with breakfast.     ezetimibe (ZETIA) 10 MG tablet TAKE 1 TABLET BY MOUTH ONCE DAILY AFTER SUPPER 90 tablet 0   fexofenadine (ALLEGRA) 180 MG tablet Take 180 mg by mouth daily.     fluticasone (FLONASE) 50 MCG/ACT nasal spray Place 1 spray into both nostrils daily as needed for allergies or rhinitis.     hydroxychloroquine (PLAQUENIL) 200 MG tablet Take 200 mg by  mouth daily.     Lancets (ONETOUCH DELICA PLUS IRCVEL38B) MISC SMARTSIG:1 Topical 3 Times Daily     lidocaine-prilocaine (EMLA) cream Apply to the Port-A-Cath site 30 to 60 minutes before chemotherapy. 30 g 2   LIVALO 4 MG TABS TAKE 1 TABLET BY MOUTH AT BEDTIME 90 tablet 0   metFORMIN (GLUCOPHAGE) 500 MG tablet Take 500 mg by mouth daily with supper.     methocarbamol (ROBAXIN) 500 MG tablet Take 1 tablet (500 mg total) by mouth every 8 (eight) hours as needed for muscle spasms. 30 tablet 0   montelukast (SINGULAIR) 10 MG tablet Take 10 mg by mouth daily as needed (allergies).     nitroGLYCERIN (NITROSTAT) 0.4 MG SL tablet Place 1 tablet (0.4 mg total) under the tongue every 5 (five) minutes as needed for chest pain. 25 tablet 2   ONETOUCH ULTRA test strip USE 1 STRIP TO CHECK GLUCOSE THREE TIMES DAILY     Polyethyl Glycol-Propyl Glycol (SYSTANE) 0.4-0.3 % GEL ophthalmic gel Place 1 application. into both eyes every 6 (six) hours as needed (dry eyes).     telmisartan (MICARDIS) 40 MG tablet Take 40 mg by mouth daily.     vitamin E 180 MG (400 UNITS) capsule Take 400 IU daily x 1 week, then 400 IU BID (Patient taking differently: Take 400 Units by mouth in the morning and at bedtime.) 60 capsule 5   No current facility-administered medications for this visit.    SURGICAL HISTORY:  Past Surgical History:  Procedure Laterality Date   ABDOMINAL HYSTERECTOMY     bone spur     CARDIAC CATHETERIZATION  06/13/2012   carpel tunnel surgery Left    COLONOSCOPY     9 years ago   CORONARY ANGIOPLASTY  06/13/2012   EYE SURGERY     cyst left eye    IR IMAGING GUIDED PORT INSERTION  10/10/2018   LEFT HEART CATHETERIZATION WITH CORONARY ANGIOGRAM N/A 06/13/2012   Procedure: LEFT HEART CATHETERIZATION WITH CORONARY ANGIOGRAM;  Surgeon: Laverda Page, MD;  Location: [facility] CATH LAB;  Service: Cardiovascular;  Laterality: N/A;   NECK SURGERY     rotator cuff surgery Right 2015   SHOULDER ARTHROSCOPY WITH  ROTATOR CUFF REPAIR AND SUBACROMIAL DECOMPRESSION Left 05/14/2021   Procedure: LEFT SHOULDER ARTHROSCOPY, SUBACROMIAL DECOMPRESSION, BICEPS TENODESIS, MINI OPEN ROTATOR CUFF REPAIR;  Surgeon: Meredith Pel, MD;  Location: [facility];  Service: Orthopedics;  Laterality: Left;   VIDEO BRONCHOSCOPY WITH ENDOBRONCHIAL NAVIGATION N/A 09/06/2018   Procedure: VIDEO BRONCHOSCOPY WITH ENDOBRONCHIAL NAVIGATION, left upper lung;  Surgeon: Lajuana Matte, MD;  Location: [facility];  Service: Thoracic;  Laterality: N/A;   VIDEO BRONCHOSCOPY WITH ENDOBRONCHIAL ULTRASOUND Left 09/06/2018   Procedure: VIDEO  BRONCHOSCOPY WITH ENDOBRONCHIAL ULTRASOUND, left lung;  Surgeon: Lajuana Matte, MD;  Location: [facility];  Service: Thoracic;  Laterality: Left;   WISDOM TOOTH EXTRACTION      REVIEW OF SYSTEMS:   Review of Systems  Constitutional: Negative for appetite change, chills, fatigue, fever and unexpected weight change.  HENT:  Negative for mouth sores, nosebleeds, sore throat and trouble swallowing.   Eyes: Negative for eye problems and icterus.  Respiratory: Negative for cough, hemoptysis, shortness of breath and wheezing.   Cardiovascular: Negative for chest pain and leg swelling.  Gastrointestinal: Negative for abdominal pain, constipation (occasionally, none at this time), diarrhea, nausea and vomiting.  Genitourinary: Negative for bladder incontinence, difficulty urinating, dysuria, frequency and hematuria.   Musculoskeletal: Negative for back pain, gait problem, neck pain and neck stiffness.  Skin: Negative for itching and rash.  Neurological: Negative for dizziness, extremity weakness, gait problem, headaches, light-headedness and seizures.  Hematological: Negative for adenopathy. Does not bruise/bleed easily.  Psychiatric/Behavioral: Negative for confusion, depression and sleep disturbance. The patient is not nervous/anxious.     PHYSICAL EXAMINATION:  Blood pressure 134/64, pulse 62, temperature 98  F (36.7 C), temperature source Oral, resp. rate 18, height _0  (1.549 m), weight 186 lb (84.4 kg), SpO2 99 %.  ECOG PERFORMANCE STATUS: 1  Physical Exam  Constitutional: Oriented to person, place, and time and well-developed, well-nourished, and in no distress. No distress.  HENT:  Head: Normocephalic and atraumatic.  Mouth/Throat: Oropharynx is clear and moist. No oropharyngeal exudate.  Eyes: Conjunctivae are normal. Right eye exhibits no discharge. Left eye exhibits no discharge. No scleral icterus.  Neck: Normal range of motion. Neck supple.  Cardiovascular: Normal rate, regular rhythm, normal heart sounds and intact distal pulses.   Pulmonary/Chest: Effort normal and breath sounds normal. No respiratory distress. No wheezes. No rales.  Abdominal: Soft. Bowel sounds are normal. Exhibits no distension and no mass. There is no tenderness.  Musculoskeletal: Normal range of motion. Exhibits no edema.  Lymphadenopathy:    No cervical adenopathy.  Neurological: Alert and oriented to person, place, and time. Exhibits normal muscle tone. Gait normal. Coordination normal.  Skin: Skin is warm and dry. No rash noted. Not diaphoretic. No erythema. No pallor.  Psychiatric: Mood, memory and judgment normal.  Vitals reviewed.  LABORATORY DATA: Lab Results  Component Value Date   WBC 5.2 02/02/2022   HGB 12.0 02/02/2022   HCT 39.3 02/02/2022   MCV 80.5 02/02/2022   PLT 244 02/02/2022      Chemistry      Component Value Date/Time   NA 140 02/02/2022 0811   NA 139 08/18/2020 0801   K 3.9 02/02/2022 0811   CL 105 02/02/2022 0811   CO2 27 02/02/2022 0811   BUN 16 02/02/2022 0811   BUN 16 08/18/2020 0801   CREATININE 1.79 (H) 02/02/2022 0811      Component Value Date/Time   CALCIUM 9.1 02/02/2022 0811   ALKPHOS 86 02/02/2022 0811   AST 20 02/02/2022 0811   ALT 12 02/02/2022 0811   BILITOT 0.3 02/02/2022 0811       RADIOGRAPHIC STUDIES:  No results  found.   ASSESSMENT/PLAN:  This is a very pleasant 66 year old African-American female with stage IV (T1c, N1, M1c) non-small cell lung cancer, adenocarcinoma. She presented with a dominant left upper lobe lung nodule in addition to a right upper lobe pulmonary nodule with left hilar lymphadenopathy. She also has metastatic disease to the brain. She has no actionable mutations  and her PDL1 expression is 1%. She was diagnosed in July 2020. She also has KRASG12C which is a targetable mutation that can be used in the second line setting.    The patient completed SBRT to the metastatic brain lesion under the care of Dr. Isidore Moos in August 2020. She also had SRS again on 09/02/20 and 12/16/20.   The patient is currently undergoing systemic chemotherapy with carboplatin for an AUC of 5, Alimta 500 mg/m, and Keytruda 200 mg IV every 3 weeks.  She is status post 56 cycles of treatment.  Starting from cycle #5, she has been on maintenance Alimta 500 mg/m2 and Keytruda 200 mg IV. She tolerated her treatment well without any concerning adverse side effects. Alimta was discontinued due to renal insufficiency.      Labs were reviewed.  Her creatinine is 1.79 today. She has baseline CKD and follows closely with nephrology.  Recommend that she proceed with cycle 59 today scheduled.  She will see her back for follow-up visit in 3 weeks for evaluation and repeat blood work before starting cycle #60.  I will arrange for a restaging CT scan prior to her next appointment. I will order this without contrast due to her CKD. She does not need to drink the oral contrast unless she meets requirements per new radiology protocol.   The patient was advised to call immediately if she has any concerning symptoms in the interval. The patient voices understanding of current disease status and treatment options and is in agreement with the current care plan. All questions were answered. The patient knows to call the clinic with any  problems, questions or concerns. We can certainly see the patient much sooner if necessary      Orders Placed This Encounter  Procedures   CT Chest Wo Contrast    Standing Status:   Future    Standing Expiration Date:   02/02/2023    Order Specific Question:   Preferred imaging location?    Answer:   [facility]   CT Abdomen Pelvis Wo Contrast    Standing Status:   Future    Standing Expiration Date:   02/02/2023    Order Specific Question:   Preferred imaging location?    Answer:   [facility]    Order Specific Question:   Is Oral Contrast requested for this exam?    Answer:   Yes, Per Radiology protocol    Order Specific Question:   Does the patient have a contrast media/X-ray dye allergy?    Answer:   No   TSH    Standing Status:   Future    Standing Expiration Date:   03/16/2023     The total time spent in the appointment was 20-29 minutes.  Hector Taft L Courtne Lighty, PA-C 02/02/22

## 2022-02-02 ENCOUNTER — Inpatient Hospital Stay: Payer: Medicare Other

## 2022-02-02 ENCOUNTER — Other Ambulatory Visit: Payer: Self-pay

## 2022-02-02 ENCOUNTER — Inpatient Hospital Stay (HOSPITAL_BASED_OUTPATIENT_CLINIC_OR_DEPARTMENT_OTHER): Payer: Medicare Other | Admitting: Physician Assistant

## 2022-02-02 VITALS — BP 134/64 | HR 62 | Temp 98.0°F | Resp 18 | Ht 61.0 in | Wt 186.0 lb

## 2022-02-02 DIAGNOSIS — C3412 Malignant neoplasm of upper lobe, left bronchus or lung: Secondary | ICD-10-CM | POA: Diagnosis not present

## 2022-02-02 DIAGNOSIS — Z79899 Other long term (current) drug therapy: Secondary | ICD-10-CM | POA: Diagnosis not present

## 2022-02-02 DIAGNOSIS — R5383 Other fatigue: Secondary | ICD-10-CM

## 2022-02-02 DIAGNOSIS — C3492 Malignant neoplasm of unspecified part of left bronchus or lung: Secondary | ICD-10-CM | POA: Diagnosis not present

## 2022-02-02 DIAGNOSIS — Z5112 Encounter for antineoplastic immunotherapy: Secondary | ICD-10-CM | POA: Diagnosis not present

## 2022-02-02 DIAGNOSIS — C7931 Secondary malignant neoplasm of brain: Secondary | ICD-10-CM | POA: Diagnosis not present

## 2022-02-02 DIAGNOSIS — Z95828 Presence of other vascular implants and grafts: Secondary | ICD-10-CM

## 2022-02-02 LAB — CBC WITH DIFFERENTIAL (CANCER CENTER ONLY)
Abs Immature Granulocytes: 0 10*3/uL (ref 0.00–0.07)
Basophils Absolute: 0 10*3/uL (ref 0.0–0.1)
Basophils Relative: 1 %
Eosinophils Absolute: 0.5 10*3/uL (ref 0.0–0.5)
Eosinophils Relative: 9 %
HCT: 39.3 % (ref 36.0–46.0)
Hemoglobin: 12 g/dL (ref 12.0–15.0)
Immature Granulocytes: 0 %
Lymphocytes Relative: 23 %
Lymphs Abs: 1.2 10*3/uL (ref 0.7–4.0)
MCH: 24.6 pg — ABNORMAL LOW (ref 26.0–34.0)
MCHC: 30.5 g/dL (ref 30.0–36.0)
MCV: 80.5 fL (ref 80.0–100.0)
Monocytes Absolute: 0.5 10*3/uL (ref 0.1–1.0)
Monocytes Relative: 9 %
Neutro Abs: 3 10*3/uL (ref 1.7–7.7)
Neutrophils Relative %: 58 %
Platelet Count: 244 10*3/uL (ref 150–400)
RBC: 4.88 MIL/uL (ref 3.87–5.11)
RDW: 16.1 % — ABNORMAL HIGH (ref 11.5–15.5)
WBC Count: 5.2 10*3/uL (ref 4.0–10.5)
nRBC: 0 % (ref 0.0–0.2)

## 2022-02-02 LAB — CMP (CANCER CENTER ONLY)
ALT: 12 U/L (ref 0–44)
AST: 20 U/L (ref 15–41)
Albumin: 3.8 g/dL (ref 3.5–5.0)
Alkaline Phosphatase: 86 U/L (ref 38–126)
Anion gap: 8 (ref 5–15)
BUN: 16 mg/dL (ref 8–23)
CO2: 27 mmol/L (ref 22–32)
Calcium: 9.1 mg/dL (ref 8.9–10.3)
Chloride: 105 mmol/L (ref 98–111)
Creatinine: 1.79 mg/dL — ABNORMAL HIGH (ref 0.44–1.00)
GFR, Estimated: 31 mL/min — ABNORMAL LOW (ref >60)
Glucose, Bld: 152 mg/dL — ABNORMAL HIGH (ref 70–99)
Potassium: 3.9 mmol/L (ref 3.5–5.1)
Sodium: 140 mmol/L (ref 135–145)
Total Bilirubin: 0.3 mg/dL (ref 0.3–1.2)
Total Protein: 6.9 g/dL (ref 6.5–8.1)

## 2022-02-02 LAB — TSH: TSH: 2.115 u[IU]/mL (ref 0.350–4.500)

## 2022-02-02 MED ORDER — SODIUM CHLORIDE 0.9 % IV SOLN
200.0000 mg | Freq: Once | INTRAVENOUS | Status: AC
  Administered 2022-02-02: 200 mg via INTRAVENOUS
  Filled 2022-02-02: qty 8

## 2022-02-02 MED ORDER — SODIUM CHLORIDE 0.9% FLUSH
10.0000 mL | INTRAVENOUS | Status: DC | PRN
  Administered 2022-02-02: 10 mL

## 2022-02-02 MED ORDER — SODIUM CHLORIDE 0.9 % IV SOLN: Freq: Once | INTRAVENOUS | Status: AC

## 2022-02-02 MED ORDER — HEPARIN SOD (PORK) LOCK FLUSH 100 UNIT/ML IV SOLN
500.0000 [IU] | Freq: Once | INTRAVENOUS | Status: AC | PRN
  Administered 2022-02-02: 500 [IU]

## 2022-02-02 NOTE — Patient Instructions (Signed)
[facility] ONCOLOGY  Discharge Instructions: Thank you for choosing [facility] to provide your oncology and hematology care.   If you have a lab appointment with the [facility], please go directly to the [facility] and check in at the registration area.   Wear comfortable clothing and clothing appropriate for easy access to any Portacath or PICC line.   We strive to give you quality time with your provider. You may need to reschedule your appointment if you arrive late (15 or more minutes).  Arriving late affects you and other patients whose appointments are after yours.  Also, if you miss three or more appointments without notifying the office, you may be dismissed from the clinic at the provider's discretion.      For prescription refill requests, have your pharmacy contact our office and allow 72 hours for refills to be completed.    Today you received the following chemotherapy and/or immunotherapy agents Keytruda      To help prevent nausea and vomiting after your treatment, we encourage you to take your nausea medication as directed.  BELOW ARE SYMPTOMS THAT SHOULD BE REPORTED IMMEDIATELY: *FEVER GREATER THAN 100.4 F (38 C) OR HIGHER *CHILLS OR SWEATING *NAUSEA AND VOMITING THAT IS NOT CONTROLLED WITH YOUR NAUSEA MEDICATION *UNUSUAL SHORTNESS OF BREATH *UNUSUAL BRUISING OR BLEEDING *URINARY PROBLEMS (pain or burning when urinating, or frequent urination) *BOWEL PROBLEMS (unusual diarrhea, constipation, pain near the anus) TENDERNESS IN MOUTH AND THROAT WITH OR WITHOUT PRESENCE OF ULCERS (sore throat, sores in mouth, or a toothache) UNUSUAL RASH, SWELLING OR PAIN  UNUSUAL VAGINAL DISCHARGE OR ITCHING   Items with * indicate a potential emergency and should be followed up as soon as possible or go to the Emergency Department if any problems should occur.  Please show the CHEMOTHERAPY ALERT CARD or IMMUNOTHERAPY ALERT CARD at check-in to  the Emergency Department and triage nurse.  Should you have questions after your visit or need to cancel or reschedule your appointment, please contact [facility]  Dept: [phone]  and follow the prompts.  Office hours are 8:00 a.m. to 4:30 p.m. Monday - Friday. Please note that voicemails left after 4:00 p.m. may not be returned until the following business day.  We are closed weekends and major holidays. You have access to a nurse at all times for urgent questions. Please call the main number to the clinic Dept: [phone] and follow the prompts.   For any non-urgent questions, you may also contact your provider using MyChart. We now offer e-Visits for anyone 22 and older to request care online for non-urgent symptoms. For details visit [email or web address].   Also download the MyChart app! Go to the app store, search "MyChart", open the app, select [facility], and log in with your MyChart username and password.  Masks are optional in the cancer centers. If you would like for your care team to wear a mask while they are taking care of you, please let them know. You may have one support person who is at least 68 years old accompany you for your appointments.

## 2022-02-02 NOTE — Progress Notes (Signed)
Per Cassie, PA ok to proceed with tx with Scr 1.79

## 2022-02-03 ENCOUNTER — Telehealth: Payer: Self-pay | Admitting: Radiation Therapy

## 2022-02-03 NOTE — Telephone Encounter (Signed)
Spoke with Ms. Flett about her MRI and follow-up with Dr. Saintclair Halsted in January. She was happy for the call and plans to attend both visits.   [name] R.T.(R)(T) Radiation Special Procedures Navigator

## 2022-02-17 ENCOUNTER — Encounter: Payer: Self-pay | Admitting: Internal Medicine

## 2022-02-19 ENCOUNTER — Other Ambulatory Visit: Payer: Self-pay | Admitting: Cardiology

## 2022-02-19 ENCOUNTER — Ambulatory Visit (HOSPITAL_COMMUNITY): Payer: Medicare Other

## 2022-02-19 DIAGNOSIS — E7800 Pure hypercholesterolemia, unspecified: Secondary | ICD-10-CM

## 2022-02-23 ENCOUNTER — Other Ambulatory Visit: Payer: Self-pay

## 2022-02-23 ENCOUNTER — Inpatient Hospital Stay: Payer: Medicare PPO

## 2022-02-23 ENCOUNTER — Inpatient Hospital Stay: Payer: Medicare PPO | Attending: Internal Medicine

## 2022-02-23 ENCOUNTER — Inpatient Hospital Stay (HOSPITAL_BASED_OUTPATIENT_CLINIC_OR_DEPARTMENT_OTHER): Payer: Medicare PPO | Admitting: Internal Medicine

## 2022-02-23 VITALS — BP 141/71 | HR 64 | Temp 98.6°F | Resp 17 | Wt 183.7 lb

## 2022-02-23 DIAGNOSIS — C3412 Malignant neoplasm of upper lobe, left bronchus or lung: Secondary | ICD-10-CM | POA: Insufficient documentation

## 2022-02-23 DIAGNOSIS — Z79899 Other long term (current) drug therapy: Secondary | ICD-10-CM | POA: Insufficient documentation

## 2022-02-23 DIAGNOSIS — C3492 Malignant neoplasm of unspecified part of left bronchus or lung: Secondary | ICD-10-CM

## 2022-02-23 DIAGNOSIS — Z5112 Encounter for antineoplastic immunotherapy: Secondary | ICD-10-CM | POA: Insufficient documentation

## 2022-02-23 DIAGNOSIS — Z95828 Presence of other vascular implants and grafts: Secondary | ICD-10-CM

## 2022-02-23 DIAGNOSIS — C7931 Secondary malignant neoplasm of brain: Secondary | ICD-10-CM | POA: Diagnosis not present

## 2022-02-23 LAB — CBC WITH DIFFERENTIAL (CANCER CENTER ONLY)
Abs Immature Granulocytes: 0.02 10*3/uL (ref 0.00–0.07)
Basophils Absolute: 0 10*3/uL (ref 0.0–0.1)
Basophils Relative: 1 %
Eosinophils Absolute: 0.6 10*3/uL — ABNORMAL HIGH (ref 0.0–0.5)
Eosinophils Relative: 10 %
HCT: 40.2 % (ref 36.0–46.0)
Hemoglobin: 12.6 g/dL (ref 12.0–15.0)
Immature Granulocytes: 0 %
Lymphocytes Relative: 21 %
Lymphs Abs: 1.3 10*3/uL (ref 0.7–4.0)
MCH: 24.8 pg — ABNORMAL LOW (ref 26.0–34.0)
MCHC: 31.3 g/dL (ref 30.0–36.0)
MCV: 79.1 fL — ABNORMAL LOW (ref 80.0–100.0)
Monocytes Absolute: 0.7 10*3/uL (ref 0.1–1.0)
Monocytes Relative: 10 %
Neutro Abs: 3.6 10*3/uL (ref 1.7–7.7)
Neutrophils Relative %: 58 %
Platelet Count: 240 10*3/uL (ref 150–400)
RBC: 5.08 MIL/uL (ref 3.87–5.11)
RDW: 15.9 % — ABNORMAL HIGH (ref 11.5–15.5)
WBC Count: 6.3 10*3/uL (ref 4.0–10.5)
nRBC: 0 % (ref 0.0–0.2)

## 2022-02-23 LAB — CMP (CANCER CENTER ONLY)
ALT: 11 U/L (ref 0–44)
AST: 19 U/L (ref 15–41)
Albumin: 3.8 g/dL (ref 3.5–5.0)
Alkaline Phosphatase: 81 U/L (ref 38–126)
Anion gap: 6 (ref 5–15)
BUN: 14 mg/dL (ref 8–23)
CO2: 28 mmol/L (ref 22–32)
Calcium: 9.2 mg/dL (ref 8.9–10.3)
Chloride: 108 mmol/L (ref 98–111)
Creatinine: 1.62 mg/dL — ABNORMAL HIGH (ref 0.44–1.00)
GFR, Estimated: 34 mL/min — ABNORMAL LOW (ref >60)
Glucose, Bld: 86 mg/dL (ref 70–99)
Potassium: 4 mmol/L (ref 3.5–5.1)
Sodium: 142 mmol/L (ref 135–145)
Total Bilirubin: 0.4 mg/dL (ref 0.3–1.2)
Total Protein: 7 g/dL (ref 6.5–8.1)

## 2022-02-23 MED ORDER — SODIUM CHLORIDE 0.9 % IV SOLN: Freq: Once | INTRAVENOUS | Status: AC

## 2022-02-23 MED ORDER — HEPARIN SOD (PORK) LOCK FLUSH 100 UNIT/ML IV SOLN
500.0000 [IU] | Freq: Once | INTRAVENOUS | Status: AC | PRN
  Administered 2022-02-23: 500 [IU]

## 2022-02-23 MED ORDER — SODIUM CHLORIDE 0.9% FLUSH
10.0000 mL | INTRAVENOUS | Status: DC | PRN
  Administered 2022-02-23: 10 mL

## 2022-02-23 MED ORDER — SODIUM CHLORIDE 0.9 % IV SOLN
200.0000 mg | Freq: Once | INTRAVENOUS | Status: AC
  Administered 2022-02-23: 200 mg via INTRAVENOUS
  Filled 2022-02-23: qty 200

## 2022-02-23 NOTE — Progress Notes (Signed)
Per Dr. Arbutus Ped ,it is ok to treat pt today with Keytruda and Serum creatinine of 1.62.

## 2022-02-23 NOTE — Patient Instructions (Addendum)
[facility] ONCOLOGY  Discharge Instructions: Thank you for choosing [facility] to provide your oncology and hematology care.   If you have a lab appointment with the Cancer Center, please go directly to the Cancer Center and check in at the registration area.   Wear comfortable clothing and clothing appropriate for easy access to any Portacath or PICC line.   We strive to give you quality time with your provider. You may need to reschedule your appointment if you arrive late (15 or more minutes).  Arriving late affects you and other patients whose appointments are after yours.  Also, if you miss three or more appointments without notifying the office, you may be dismissed from the clinic at the provider's discretion.      For prescription refill requests, have your pharmacy contact our office and allow 72 hours for refills to be completed.    Today you received the following chemotherapy and/or immunotherapy agents: Keytruda.       To help prevent nausea and vomiting after your treatment, we encourage you to take your nausea medication as directed.  BELOW ARE SYMPTOMS THAT SHOULD BE REPORTED IMMEDIATELY: *FEVER GREATER THAN 100.4 F (38 C) OR HIGHER *CHILLS OR SWEATING *NAUSEA AND VOMITING THAT IS NOT CONTROLLED WITH YOUR NAUSEA MEDICATION *UNUSUAL SHORTNESS OF BREATH *UNUSUAL BRUISING OR BLEEDING *URINARY PROBLEMS (pain or burning when urinating, or frequent urination) *BOWEL PROBLEMS (unusual diarrhea, constipation, pain near the anus) TENDERNESS IN MOUTH AND THROAT WITH OR WITHOUT PRESENCE OF ULCERS (sore throat, sores in mouth, or a toothache) UNUSUAL RASH, SWELLING OR PAIN  UNUSUAL VAGINAL DISCHARGE OR ITCHING   Items with * indicate a potential emergency and should be followed up as soon as possible or go to the Emergency Department if any problems should occur.  Please show the CHEMOTHERAPY ALERT CARD or IMMUNOTHERAPY ALERT CARD at check-in to  the Emergency Department and triage nurse.  Should you have questions after your visit or need to cancel or reschedule your appointment, please contact [facility]COL[facility]  Dept: [phone]  and follow the prompts.  Office hours are 8:00 a.m. to 4:30 p.m. Monday - Friday. Please note that voicemails left after 4:00 p.m. may not be returned until the following business day.  We are closed weekends and major holidays. You have access to a nurse at all times for urgent questions. Please call the main number to the clinic Dept: [phone] and follow the prompts.   For any non-urgent questions, you may also contact your provider using MyChart. We now offer e-Visits for anyone 33 and older to request care online for non-urgent symptoms. For details visit [email or web address].   Also download the MyChart app! Go to the app store, search "MyChart", open the app, select , and log in with your MyChart username and password.

## 2022-02-23 NOTE — Progress Notes (Signed)
[facility] [phone]   [phone]  OFFICE PROGRESS NOTE  [name], FNP [address] [place] [address]  DIAGNOSIS: Stage IV (T1c, N1, M1c ) non-small cell lung cancer, adenocarcinoma diagnosed in July 2020 and presented with left upper lobe lung nodule in addition to right upper lobe lung nodule with left hilar lymphadenopathy as well as multiple brain metastasis.  Biomarker Findings Microsatellite status - MS-Stable Tumor Mutational Burden - 4 Muts/Mb Genomic Findings For a complete list of the genes assayed, please refer to the Appendix. ATM G204* KRAS G12C PDGFRA P122T SMO R199W 7 Disease relevant genes with no reportable alterations: ALK, BRAF, EGFR, ERBB2, MET, RET, ROS1  PDL 1 expression was 1%  PRIOR THERAPY:  1) SRS to multiple brain metastasis under the care of Dr. Basilio Cairo. 2) SRS to a 4 mm new brain metastasis under the care of Dr. Basilio Cairo and Dr. Wynetta Emery on 09/02/2020  CURRENT THERAPY: Systemic chemotherapy with carboplatin for AUC of 5, Alimta 500 mg/M2 and Keytruda 200 mg IV every 3 weeks.  First dose October 05, 2018.  Status post 59 cycles.  Starting from cycle #5 the patient will be on treatment with maintenance Alimta 500 mg/M2 and Keytruda 200 mg IV every 3 weeks.  She has been on treatment with single agent Keytruda starting from cycle #9 secondary to renal insufficiency.  INTERVAL HISTORY: Carolyn [name] 67 y.o. female returns to the clinic today for follow-up visit.  The patient is feeling fine today with no concerning complaints except for chest congestion.  She denied having any chest pain, shortness of breath but has mild cough with no hemoptysis.  She has no nausea, vomiting, diarrhea or constipation.  She has no headache or visual changes.  She denied having any weight loss or night sweats.  She continues to tolerate her maintenance treatment with Keytruda fairly well.  The patient is here  today for evaluation before starting cycle #60.   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  MEDICATIONS:  Current Outpatient Medications  Medication Sig Dispense Refill   acetaminophen (TYLENOL) 500 MG tablet Take 1,000 mg by mouth every 8 (eight) hours as needed for mild pain, fever or headache.      amLODipine (NORVASC) 10 MG tablet Take 1 tablet by mouth once daily 90 tablet 0   aspirin EC 81 MG tablet Take 81 mg by mouth daily.     carvedilol (COREG) 25 MG tablet Take 1/2 (one-half) tablet by mouth twice daily 180 tablet 2   Cholecalciferol (VITAMIN D-3) 125  MCG (5000 UT) TABS Take 5,000 Units by mouth daily.     doxycycline (VIBRAMYCIN) 100 MG capsule Take 100 mg by mouth 2 (two) times daily.     empagliflozin (JARDIANCE) 25 MG TABS tablet Take 1 tablet (25 mg total) by mouth daily before breakfast. 90 tablet 3   esomeprazole (NEXIUM) 20 MG capsule Take 20 mg by mouth daily with breakfast.     ezetimibe (ZETIA) 10 MG tablet TAKE 1 TABLET BY MOUTH ONCE DAILY AFTER SUPPER 90 tablet 0   fexofenadine (ALLEGRA) 180 MG tablet Take 180 mg by mouth daily.     fluticasone (FLONASE) 50 MCG/ACT nasal spray Place 1 spray into both nostrils daily as needed for allergies or rhinitis.     hydroxychloroquine (PLAQUENIL) 200 MG tablet  Take 200 mg by mouth daily.     Lancets (ONETOUCH DELICA PLUS LANCET33G) MISC SMARTSIG:1 Topical 3 Times Daily     lidocaine-prilocaine (EMLA) cream Apply to the Port-A-Cath site 30 to 60 minutes before chemotherapy. 30 g 2   LIVALO 4 MG TABS TAKE 1 TABLET BY MOUTH AT BEDTIME 90 tablet 0   metFORMIN (GLUCOPHAGE) 500 MG tablet Take 500 mg by mouth daily with supper.     methocarbamol (ROBAXIN) 500 MG tablet Take 1 tablet (500 mg total) by mouth every 8 (eight) hours as needed for muscle spasms. 30 tablet 0   montelukast (SINGULAIR) 10 MG tablet Take 10 mg by mouth daily as needed (allergies).     nitroGLYCERIN (NITROSTAT) 0.4 MG SL tablet Place 1 tablet (0.4 mg total) under the tongue every 5 (five) minutes as needed for chest pain. (Patient not taking: Reported on 10/20/2021) 25 tablet 2   ONETOUCH ULTRA test strip USE 1 STRIP TO CHECK GLUCOSE THREE TIMES DAILY     Polyethyl Glycol-Propyl Glycol (SYSTANE) 0.4-0.3 % GEL ophthalmic gel Place 1 application. into both eyes every 6 (six) hours as needed (dry eyes).     telmisartan (MICARDIS) 40 MG tablet Take 40 mg by mouth daily.     vitamin E 180 MG (400 UNITS) capsule Take 400 IU daily x 1 week, then 400 IU BID (Patient taking differently: Take 400 Units by mouth in the morning and at bedtime.) 60 capsule 5   No current facility-administered medications for this visit.    SURGICAL HISTORY:  Past Surgical History:  Procedure Laterality Date   ABDOMINAL HYSTERECTOMY     bone spur     CARDIAC CATHETERIZATION  06/13/2012   carpel tunnel surgery Left    COLONOSCOPY     9 years ago   CORONARY ANGIOPLASTY  06/13/2012   EYE SURGERY     cyst left eye    IR IMAGING GUIDED PORT INSERTION  10/10/2018   LEFT HEART CATHETERIZATION WITH CORONARY ANGIOGRAM N/A 06/13/2012   Procedure: LEFT HEART CATHETERIZATION WITH CORONARY ANGIOGRAM;  Surgeon: Pamella Pert, MD;  Location: [facility] CATH LAB;  Service: Cardiovascular;  Laterality: N/A;   NECK SURGERY      rotator cuff surgery Right 2015   SHOULDER ARTHROSCOPY WITH ROTATOR CUFF REPAIR AND SUBACROMIAL DECOMPRESSION Left 05/14/2021   Procedure: LEFT SHOULDER ARTHROSCOPY, SUBACROMIAL DECOMPRESSION, BICEPS TENODESIS, MINI OPEN ROTATOR CUFF REPAIR;  Surgeon: Cammy Copa, MD;  Location: [facility] OR;  Service: Orthopedics;  Laterality: Left;   VIDEO BRONCHOSCOPY WITH ENDOBRONCHIAL NAVIGATION N/A 09/06/2018   Procedure: VIDEO BRONCHOSCOPY WITH ENDOBRONCHIAL NAVIGATION, left upper lung;  Surgeon: Corliss Skains, MD;  Location: [facility] OR;  Service: Thoracic;  Laterality: N/A;  VIDEO BRONCHOSCOPY WITH ENDOBRONCHIAL ULTRASOUND Left 09/06/2018   Procedure: VIDEO BRONCHOSCOPY WITH ENDOBRONCHIAL ULTRASOUND, left lung;  Surgeon: Corliss Skains, MD;  Location: [facility] OR;  Service: Thoracic;  Laterality: Left;   WISDOM TOOTH EXTRACTION      REVIEW OF SYSTEMS:  A comprehensive review of systems was negative except for: Respiratory: positive for cough   PHYSICAL EXAMINATION: General appearance: alert, cooperative, and no distress Head: Normocephalic, without obvious abnormality, atraumatic Neck: no adenopathy, no JVD, supple, symmetrical, trachea midline, and thyroid not enlarged, symmetric, no tenderness/mass/nodules Lymph nodes: Cervical, supraclavicular, and axillary nodes normal. Resp: clear to auscultation bilaterally Back: symmetric, no curvature. ROM normal. No CVA tenderness. Cardio: regular rate and rhythm, S1, S2 normal, no murmur, click, rub or gallop GI: soft, non-tender; bowel sounds normal; no masses,  no organomegaly Extremities: extremities normal, atraumatic, no cyanosis or edema  ECOG PERFORMANCE STATUS: 1 - Symptomatic but completely ambulatory  Blood pressure 135/64, pulse 62, temperature 98.3 F (36.8 C), temperature source Oral, resp. rate 15, height 5\' 1"  (1.549 m), weight 185 lb 8 oz (84.1 kg), SpO2 100 %.  LABORATORY DATA: Lab Results  Component Value Date   WBC 9.0 01/12/2022    HGB 12.6 01/12/2022   HCT 40.5 01/12/2022   MCV 80.2 01/12/2022   PLT 272 01/12/2022      Chemistry      Component Value Date/Time   NA 138 12/22/2021 0948   NA 139 08/18/2020 0801   K 4.1 12/22/2021 0948   CL 105 12/22/2021 0948   CO2 27 12/22/2021 0948   BUN 18 12/22/2021 0948   BUN 16 08/18/2020 0801   CREATININE 1.70 (H) 12/22/2021 0948      Component Value Date/Time   CALCIUM 9.1 12/22/2021 0948   ALKPHOS 85 12/22/2021 0948   AST 18 12/22/2021 0948   ALT 12 12/22/2021 0948   BILITOT 0.4 12/22/2021 0948       RADIOGRAPHIC STUDIES: DG Chest 2 View  Result Date: 12/18/2021 CLINICAL DATA:  Chest pain. EXAM: CHEST - 2 VIEW COMPARISON:  Chest radiograph dated 11/10/2020. FINDINGS: Right-sided Port-A-Cath in similar position. No focal consolidation, pleural effusion, or pneumothorax. The cardiac silhouette is within normal limits. Atherosclerotic calcification of the aorta. No acute osseous pathology. IMPRESSION: No active cardiopulmonary disease. Electronically Signed   By: 01/10/2021 M.D.   On: 12/18/2021 19:27     ASSESSMENT AND PLAN: This is a very pleasant 67 years old African-American female with likely stage IV (T1c, N1, M1C) non-small cell lung cancer, adenocarcinoma with positive KRAS G12C mutation diagnosed in July 2020 and presented with left upper lobe lung nodule in addition to right upper lobe pulmonary nodule as well as left hilar adenopathy and metastatic lesion to the brain. Molecular studies showed no actionable mutation and PDL 1 expression was 1%. The patient underwent SBRT to the metastatic brain lesion under the care of Dr. August 2020. The patient is currently undergoing systemic chemotherapy with carboplatin for AUC of 5, Alimta 500 mg/M2 and Keytruda 200 mg IV every 3 weeks is status post 59 cycles.  Starting from cycle #5 she is on maintenance treatment with Alimta and Keytruda every 3 weeks with only Keytruda on the last few cycles because of the renal  insufficiency. The patient has been tolerating this treatment well with no concerning adverse effects. I recommended for her to proceed with cycle #60 today as planned. I will see her back for follow-up visit in 3 weeks with repeat CT scan of  the chest, abdomen and pelvis for restaging of her disease. The patient was advised to call immediately if she has any other concerning symptoms in the interval. The patient voices understanding of current disease status and treatment options and is in agreement with the current care plan. All questions were answered. The patient knows to call the clinic with any problems, questions or concerns. We can certainly see the patient much sooner if necessary.  Disclaimer: This note was dictated with voice recognition software. Similar sounding words can inadvertently be transcribed and may not be corrected upon review.

## 2022-02-24 ENCOUNTER — Other Ambulatory Visit: Payer: Medicare Other

## 2022-03-09 ENCOUNTER — Other Ambulatory Visit: Payer: Self-pay | Admitting: Cardiology

## 2022-03-09 DIAGNOSIS — I10 Essential (primary) hypertension: Secondary | ICD-10-CM

## 2022-03-10 DIAGNOSIS — J069 Acute upper respiratory infection, unspecified: Secondary | ICD-10-CM | POA: Diagnosis not present

## 2022-03-10 DIAGNOSIS — M359 Systemic involvement of connective tissue, unspecified: Secondary | ICD-10-CM | POA: Diagnosis not present

## 2022-03-10 DIAGNOSIS — J309 Allergic rhinitis, unspecified: Secondary | ICD-10-CM | POA: Diagnosis not present

## 2022-03-11 NOTE — Progress Notes (Signed)
[facility] OFFICE PROGRESS NOTE  [name], [place] [address]  DIAGNOSIS: Stage IV (T1c, N1, M1c ) non-small cell lung cancer, adenocarcinoma diagnosed in July 2020 and presented with left upper lobe lung nodule in addition to right upper lobe lung nodule with left hilar lymphadenopathy as well as multiple brain metastasis.   Biomarker Findings Microsatellite status - MS-Stable Tumor Mutational Burden - 4 Muts/Mb Genomic Findings For a complete list of the genes assayed, please refer to the Appendix. ATM G204* KRAS G12C PDGFRA P122T SMO R199W 7 Disease relevant genes with no reportable alterations: ALK, BRAF, EGFR, ERBB2, MET, RET, ROS1   PDL 1 expression was 1%  PRIOR THERAPY: 1) SBRT to multiple brain metastasis under the care of Dr. Isidore Moos. Completed in August 2020 2) SRS to the new metastatic brain lesions under the care of Dr. Saintclair Halsted and Dr. Isidore Moos on 09/02/20 and 12/16/20  CURRENT THERAPY: Systemic chemotherapy with carboplatin for AUC of 5, Alimta 500 mg/M2 and Keytruda 200 mg IV every 3 weeks.  First dose October 05, 2018.  Status post 60 cycles.  Starting from cycle #5 the patient will be on treatment with maintenance Alimta 500 mg/M2 and Keytruda 200 mg IV every 3 weeks.  She has been on treatment with single agent Keytruda secondary to renal insufficiency    INTERVAL HISTORY: Carolyn [name] 67 y.o. female returns to the clinic today for a follow-up visit. The patient is feeling fairly well today without any concerning complaints except this patient experiences sinus infections almost every winter.  Her current sinus congestion/pressure has been going on since 02/08/2022.  She saw her PCP who prescribed a Z-Pak.  The patient completed this a few days ago without any significant improvement.  The patient is established with a allergist due to her recurrent sinus infections and has an appointment on 03/30/2022.  The  patient reports she tested herself for COVID x 2 which was negative.  She denies any associated fevers, chills, chest congestions, cough, or shortness of breath.  He uses Allegra every day.  She also is considering getting saline spray and was asking about a Nettie pot.  She tried taking Mucinex but it dried out her mouth too much.  Otherwise, she denies any chest pain or hemoptysis.  Denies any nausea, vomiting, diarrhea.  She sometimes has occasional constipation for which she takes over-the-counter laxatives.  Denies any rashes or skin changes.  She is scheduled for repeat brain MRI on 03/17/2022 and follow-up with radiation oncology.  A CT scan to restage her disease is scheduled for 03/22/2022 due to insurance. She is here today for evaluation and repeat blood work before starting cycle #61.      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  MEDICATIONS:  Current Outpatient Medications  Medication Sig Dispense Refill   acetaminophen (TYLENOL) 500 MG tablet Take 1,000 mg by mouth every 8 (eight) hours as needed for mild pain, fever or headache.      amLODipine (NORVASC) 10 MG tablet Take 1 tablet by mouth once daily 90 tablet 0   aspirin EC 81 MG tablet Take 81 mg by mouth daily.     carvedilol (COREG) 25 MG tablet Take 1/2 (one-half)  tablet by mouth twice daily 180 tablet 2   Cholecalciferol (VITAMIN D-3) 125 MCG (5000 UT) TABS Take 5,000 Units by mouth daily.     empagliflozin (JARDIANCE) 25 MG TABS tablet Take 1 tablet (25 mg total) by mouth daily before breakfast. 90 tablet 3   esomeprazole (NEXIUM) 20 MG capsule Take 20 mg by mouth daily with breakfast.     ezetimibe (ZETIA) 10 MG tablet TAKE 1 TABLET BY MOUTH ONCE DAILY AFTER SUPPER 90 tablet 0   fexofenadine (ALLEGRA) 180 MG tablet Take 180 mg by mouth daily.     fluticasone (FLONASE) 50 MCG/ACT nasal spray Place 1 spray into both nostrils daily as needed for allergies or rhinitis.     hydroxychloroquine (PLAQUENIL) 200 MG tablet Take 200 mg by mouth daily.     Lancets (ONETOUCH DELICA PLUS [id number]) MISC SMARTSIG:1 Topical 3 Times Daily     lidocaine-prilocaine (EMLA) cream Apply to the Port-A-Cath site 30 to 60 minutes before chemotherapy. 30 g 2   LIVALO 4 MG TABS TAKE 1 TABLET BY MOUTH AT BEDTIME 90 tablet 0   metFORMIN (GLUCOPHAGE) 500 MG tablet Take 500 mg by mouth daily with supper.     methocarbamol (ROBAXIN) 500 MG tablet Take 1 tablet (500 mg total) by mouth every 8 (eight) hours as needed for muscle spasms. 30 tablet 0   montelukast (SINGULAIR) 10 MG tablet Take 10 mg by mouth daily as needed (allergies).     nitroGLYCERIN (NITROSTAT) 0.4 MG SL tablet Place 1 tablet (0.4 mg total) under the tongue every 5 (five) minutes as needed for chest pain. 25 tablet 2   ONETOUCH ULTRA test strip USE 1 STRIP TO CHECK GLUCOSE THREE TIMES DAILY     Polyethyl Glycol-Propyl Glycol (SYSTANE) 0.4-0.3 % GEL ophthalmic gel Place 1 application. into both eyes every 6 (six) hours as needed (dry eyes).     telmisartan (MICARDIS) 40 MG tablet Take 40 mg by mouth daily.     vitamin E 180 MG (400 UNITS) capsule Take 400 IU daily x 1 week, then 400 IU BID (Patient taking differently: Take 400 Units by mouth in the morning and at bedtime.) 60 capsule 5   No current  facility-administered medications for this visit.    SURGICAL HISTORY:  Past Surgical History:  Procedure Laterality Date   ABDOMINAL HYSTERECTOMY     bone spur     CARDIAC CATHETERIZATION  06/13/2012   carpel tunnel surgery Left    COLONOSCOPY     9 years ago   CORONARY ANGIOPLASTY  06/13/2012   EYE SURGERY     cyst left eye    IR IMAGING GUIDED PORT INSERTION  10/10/2018   LEFT HEART CATHETERIZATION WITH CORONARY ANGIOGRAM N/A 06/13/2012   Procedure: LEFT HEART CATHETERIZATION WITH CORONARY ANGIOGRAM;  Surgeon: Laverda Page, MD;  Location: [facility] CATH  LAB;  Service: Cardiovascular;  Laterality: N/A;   NECK SURGERY     rotator cuff surgery Right 2015   SHOULDER ARTHROSCOPY WITH ROTATOR CUFF REPAIR AND SUBACROMIAL DECOMPRESSION Left 05/14/2021   Procedure: LEFT SHOULDER ARTHROSCOPY, SUBACROMIAL DECOMPRESSION, BICEPS TENODESIS, MINI OPEN ROTATOR CUFF REPAIR;  Surgeon: Meredith Pel, MD;  Location: [facility];  Service: Orthopedics;  Laterality: Left;   VIDEO BRONCHOSCOPY WITH ENDOBRONCHIAL NAVIGATION N/A 09/06/2018   Procedure: VIDEO BRONCHOSCOPY WITH ENDOBRONCHIAL NAVIGATION, left upper lung;  Surgeon: Lajuana Matte, MD;  Location: [facility];  Service: Thoracic;  Laterality: N/A;   VIDEO BRONCHOSCOPY WITH ENDOBRONCHIAL ULTRASOUND Left 09/06/2018   Procedure: VIDEO BRONCHOSCOPY WITH ENDOBRONCHIAL ULTRASOUND, left lung;  Surgeon: Lajuana Matte, MD;  Location: [facility];  Service: Thoracic;  Laterality: Left;   WISDOM TOOTH EXTRACTION      REVIEW OF SYSTEMS:   Constitutional: Negative for appetite change, chills, fatigue, fever and unexpected weight change.  HENT: Positive for sinus pressure/nasal congestion.  Negative for mouth sores, nosebleeds, sore throat and trouble swallowing.   Eyes: Negative for eye problems and icterus.  Respiratory: Negative for cough, hemoptysis, shortness of breath and wheezing.   Cardiovascular: Negative for chest pain and leg swelling.  Gastrointestinal:  Negative for abdominal pain, constipation (occasionally, none at this time), diarrhea, nausea and vomiting.  Genitourinary: Negative for bladder incontinence, difficulty urinating, dysuria, frequency and hematuria.   Musculoskeletal: Negative for back pain, gait problem, neck pain and neck stiffness.  Skin: Negative for itching and rash.  Neurological: Negative for dizziness, extremity weakness, gait problem, headaches, light-headedness and seizures.  Hematological: Negative for adenopathy. Does not bruise/bleed easily.  Psychiatric/Behavioral: Negative for confusion, depression and sleep disturbance. The patient is not nervous/anxious.     PHYSICAL EXAMINATION:  There were no vitals taken for this visit.  ECOG PERFORMANCE STATUS: 1  Physical Exam  Constitutional: Oriented to person, place, and time and well-developed, well-nourished, and in no distress. No distress.  HENT:  Head: Normocephalic and atraumatic.  Mouth/Throat: Oropharynx is clear and moist. No oropharyngeal exudate.  Eyes: Conjunctivae are normal. Right eye exhibits no discharge. Left eye exhibits no discharge. No scleral icterus.  Neck: Normal range of motion. Neck supple.  Cardiovascular: Normal rate, regular rhythm, normal heart sounds and intact distal pulses.   Pulmonary/Chest: Effort normal and breath sounds normal. No respiratory distress. No wheezes. No rales.  Abdominal: Soft. Bowel sounds are normal. Exhibits no distension and no mass. There is no tenderness.  Musculoskeletal: Normal range of motion. Exhibits no edema.  Lymphadenopathy:    No cervical adenopathy.  Neurological: Alert and oriented to person, place, and time. Exhibits normal muscle tone. Gait normal. Coordination normal.  Skin: Skin is warm and dry. No rash noted. Not diaphoretic. No erythema. No pallor.  Psychiatric: Mood, memory and judgment normal.  Vitals reviewed.  LABORATORY DATA: Lab Results  Component Value Date   WBC 6.3 02/23/2022    HGB 12.6 02/23/2022   HCT 40.2 02/23/2022   MCV 79.1 (L) 02/23/2022   PLT 240 02/23/2022      Chemistry      Component Value Date/Time   NA 142 02/23/2022 0857   NA 139 08/18/2020 0801   K 4.0 02/23/2022 0857   CL 108 02/23/2022 0857   CO2 28 02/23/2022 0857   BUN 14 02/23/2022 0857   BUN 16 08/18/2020 0801   CREATININE 1.62 (H) 02/23/2022 0857      Component Value Date/Time   CALCIUM 9.2 02/23/2022 0857  ALKPHOS 81 02/23/2022 0857   AST 19 02/23/2022 0857   ALT 11 02/23/2022 0857   BILITOT 0.4 02/23/2022 0857       RADIOGRAPHIC STUDIES:  No results found.   ASSESSMENT/PLAN:  This is a very pleasant 67 year old African-American female with stage IV (T1c, N1, M1c) non-small cell lung cancer, adenocarcinoma. She presented with a dominant left upper lobe lung nodule in addition to a right upper lobe pulmonary nodule with left hilar lymphadenopathy. She also has metastatic disease to the brain. She has no actionable mutations and her PDL1 expression is 1%. She was diagnosed in July 2020. She also has KRASG12C which is a targetable mutation that can be used in the second line setting.    The patient completed SBRT to the metastatic brain lesion under the care of Dr. Isidore Moos in August 2020. She also had SRS again on 09/02/20 and 12/16/20.   The patient is currently undergoing systemic chemotherapy with carboplatin for an AUC of 5, Alimta 500 mg/m, and Keytruda 200 mg IV every 3 weeks.  She is status post 60 cycles of treatment.  Starting from cycle #5, she has been on maintenance Alimta 500 mg/m2 and Keytruda 200 mg IV. She tolerated her treatment well without any concerning adverse side effects. Alimta was discontinued due to renal insufficiency.      Labs were reviewed.  Her creatinine is 1.51  today. She has baseline CKD and follows closely with nephrology.  Recommend that she proceed with cycle 61 today scheduled.   She will see her back for follow-up visit in 3 weeks for  evaluation and repeat blood work before starting cycle #61.   Will see her back for follow-up visit in 3 weeks.  She is scheduled for repeat CT scan on 03/22/2022.  We will review her scan results with her at her next appointment unless there are concerning findings for disease progression for which we will then call the patient sooner to be evaluated.  The patient is scheduled to see an allergist since 03/30/2022.  The patient has recurrent sinus infections almost every fall/winter.  I will send the patient a prescription for Augmentin to the pharmacy to take twice daily for 6 days.  In her allergy list, cephalexin is listed as an allergy without any comments as to the type of reaction.  I discussed her allergy with the patient.  The patient states that she has taken Augmentin in the past without any adverse side effects.  She actually reports that in the past when she had a persistent sinus infection that Augmentin helped resolve her symptoms the most.  The meantime she is advised to continue taking antihistamine and use saline spray.  Discussed with the patient that viral infections as well as allergies can also flareup sinusitis and that Augmentin may not be helpful if this is not a bacterial component.  Ever, considering her symptoms have been occurring for approximately 1 month, reasonable to try an antibiotic.  The patient is scheduled for a brain MRI tomorrow and I did mention to the patient that her brain MRI may be able to see her sinuses.  The patient was advised to call immediately if she has any concerning symptoms in the interval. The patient voices understanding of current disease status and treatment options and is in agreement with the current care plan. All questions were answered. The patient knows to call the clinic with any problems, questions or concerns. We can certainly see the patient much sooner if necessary  No orders of the defined types were placed in this  encounter.    The total time spent in the appointment was 20-29 minutes  Karsen Fellows L Lucus Lambertson, PA-C 03/11/22

## 2022-03-16 ENCOUNTER — Ambulatory Visit: Payer: Medicare Other | Admitting: Internal Medicine

## 2022-03-16 ENCOUNTER — Inpatient Hospital Stay (HOSPITAL_BASED_OUTPATIENT_CLINIC_OR_DEPARTMENT_OTHER): Payer: Medicare Other | Admitting: Physician Assistant

## 2022-03-16 ENCOUNTER — Other Ambulatory Visit: Payer: Medicare Other

## 2022-03-16 ENCOUNTER — Other Ambulatory Visit: Payer: Self-pay

## 2022-03-16 ENCOUNTER — Inpatient Hospital Stay: Payer: Medicare Other

## 2022-03-16 ENCOUNTER — Inpatient Hospital Stay: Payer: Medicare Other | Attending: Physician Assistant

## 2022-03-16 ENCOUNTER — Encounter: Payer: Self-pay | Admitting: Internal Medicine

## 2022-03-16 ENCOUNTER — Ambulatory Visit: Payer: Medicare Other

## 2022-03-16 VITALS — BP 131/64 | HR 62 | Temp 98.4°F | Resp 18 | Ht 61.0 in | Wt 188.3 lb

## 2022-03-16 DIAGNOSIS — C7931 Secondary malignant neoplasm of brain: Secondary | ICD-10-CM | POA: Insufficient documentation

## 2022-03-16 DIAGNOSIS — C3492 Malignant neoplasm of unspecified part of left bronchus or lung: Secondary | ICD-10-CM

## 2022-03-16 DIAGNOSIS — Z95828 Presence of other vascular implants and grafts: Secondary | ICD-10-CM

## 2022-03-16 DIAGNOSIS — J329 Chronic sinusitis, unspecified: Secondary | ICD-10-CM | POA: Diagnosis not present

## 2022-03-16 DIAGNOSIS — C3412 Malignant neoplasm of upper lobe, left bronchus or lung: Secondary | ICD-10-CM | POA: Insufficient documentation

## 2022-03-16 DIAGNOSIS — Z5112 Encounter for antineoplastic immunotherapy: Secondary | ICD-10-CM | POA: Insufficient documentation

## 2022-03-16 DIAGNOSIS — Z79899 Other long term (current) drug therapy: Secondary | ICD-10-CM | POA: Diagnosis not present

## 2022-03-16 LAB — CBC WITH DIFFERENTIAL (CANCER CENTER ONLY)
Abs Immature Granulocytes: 0.01 10*3/uL (ref 0.00–0.07)
Basophils Absolute: 0 10*3/uL (ref 0.0–0.1)
Basophils Relative: 1 %
Eosinophils Absolute: 0.8 10*3/uL — ABNORMAL HIGH (ref 0.0–0.5)
Eosinophils Relative: 13 %
HCT: 39.2 % (ref 36.0–46.0)
Hemoglobin: 12 g/dL (ref 12.0–15.0)
Immature Granulocytes: 0 %
Lymphocytes Relative: 21 %
Lymphs Abs: 1.3 10*3/uL (ref 0.7–4.0)
MCH: 24.4 pg — ABNORMAL LOW (ref 26.0–34.0)
MCHC: 30.6 g/dL (ref 30.0–36.0)
MCV: 79.7 fL — ABNORMAL LOW (ref 80.0–100.0)
Monocytes Absolute: 0.5 10*3/uL (ref 0.1–1.0)
Monocytes Relative: 9 %
Neutro Abs: 3.4 10*3/uL (ref 1.7–7.7)
Neutrophils Relative %: 56 %
Platelet Count: 242 10*3/uL (ref 150–400)
RBC: 4.92 MIL/uL (ref 3.87–5.11)
RDW: 15.8 % — ABNORMAL HIGH (ref 11.5–15.5)
WBC Count: 6 10*3/uL (ref 4.0–10.5)
nRBC: 0 % (ref 0.0–0.2)

## 2022-03-16 LAB — CMP (CANCER CENTER ONLY)
ALT: 12 U/L (ref 0–44)
AST: 18 U/L (ref 15–41)
Albumin: 3.7 g/dL (ref 3.5–5.0)
Alkaline Phosphatase: 86 U/L (ref 38–126)
Anion gap: 8 (ref 5–15)
BUN: 12 mg/dL (ref 8–23)
CO2: 26 mmol/L (ref 22–32)
Calcium: 8.9 mg/dL (ref 8.9–10.3)
Chloride: 106 mmol/L (ref 98–111)
Creatinine: 1.51 mg/dL — ABNORMAL HIGH (ref 0.44–1.00)
GFR, Estimated: 37 mL/min — ABNORMAL LOW (ref >60)
Glucose, Bld: 138 mg/dL — ABNORMAL HIGH (ref 70–99)
Potassium: 4 mmol/L (ref 3.5–5.1)
Sodium: 140 mmol/L (ref 135–145)
Total Bilirubin: 0.4 mg/dL (ref 0.3–1.2)
Total Protein: 6.4 g/dL — ABNORMAL LOW (ref 6.5–8.1)

## 2022-03-16 LAB — TSH: TSH: 2.422 u[IU]/mL (ref 0.350–4.500)

## 2022-03-16 MED ORDER — AMOXICILLIN-POT CLAVULANATE 875-125 MG PO TABS: 1.0000 | ORAL_TABLET | Freq: Two times a day (BID) | ORAL | 0 refills | Status: DC

## 2022-03-16 MED ORDER — LIDOCAINE-PRILOCAINE 2.5-2.5 % EX CREA: TOPICAL_CREAM | CUTANEOUS | 2 refills | Status: DC

## 2022-03-16 MED ORDER — SODIUM CHLORIDE 0.9 % IV SOLN
200.0000 mg | Freq: Once | INTRAVENOUS | Status: AC
  Administered 2022-03-16: 200 mg via INTRAVENOUS
  Filled 2022-03-16: qty 8

## 2022-03-16 MED ORDER — SODIUM CHLORIDE 0.9% FLUSH
10.0000 mL | INTRAVENOUS | Status: DC | PRN
  Administered 2022-03-16: 10 mL

## 2022-03-16 MED ORDER — SODIUM CHLORIDE 0.9 % IV SOLN: Freq: Once | INTRAVENOUS | Status: AC

## 2022-03-16 MED ORDER — HEPARIN SOD (PORK) LOCK FLUSH 100 UNIT/ML IV SOLN
500.0000 [IU] | Freq: Once | INTRAVENOUS | Status: AC | PRN
  Administered 2022-03-16: 500 [IU]

## 2022-03-16 NOTE — Patient Instructions (Signed)
[facility]  Discharge Instructions: Thank you for choosing [facility] to provide your oncology and hematology care.   If you have a lab appointment with the [facility], please go directly to the [facility] and check in at the registration area.   Wear comfortable clothing and clothing appropriate for easy access to any Portacath or PICC line.   We strive to give you quality time with your provider. You may need to reschedule your appointment if you arrive late (15 or more minutes).  Arriving late affects you and other patients whose appointments are after yours.  Also, if you miss three or more appointments without notifying the office, you may be dismissed from the clinic at the provider's discretion.      For prescription refill requests, have your pharmacy contact our office and allow 72 hours for refills to be completed.    Today you received the following chemotherapy and/or immunotherapy agents: Keytruda      To help prevent nausea and vomiting after your treatment, we encourage you to take your nausea medication as directed.  BELOW ARE SYMPTOMS THAT SHOULD BE REPORTED IMMEDIATELY: *FEVER GREATER THAN 100.4 F (38 C) OR HIGHER *CHILLS OR SWEATING *NAUSEA AND VOMITING THAT IS NOT CONTROLLED WITH YOUR NAUSEA MEDICATION *UNUSUAL SHORTNESS OF BREATH *UNUSUAL BRUISING OR BLEEDING *URINARY PROBLEMS (pain or burning when urinating, or frequent urination) *BOWEL PROBLEMS (unusual diarrhea, constipation, pain near the anus) TENDERNESS IN MOUTH AND THROAT WITH OR WITHOUT PRESENCE OF ULCERS (sore throat, sores in mouth, or a toothache) UNUSUAL RASH, SWELLING OR PAIN  UNUSUAL VAGINAL DISCHARGE OR ITCHING   Items with * indicate a potential emergency and should be followed up as soon as possible or go to the Emergency Department if any problems should occur.  Please show the CHEMOTHERAPY ALERT CARD or IMMUNOTHERAPY ALERT CARD at  check-in to the Emergency Department and triage nurse.  Should you have questions after your visit or need to cancel or reschedule your appointment, please contact [facility]  Dept: [phone]  and follow the prompts.  Office hours are 8:00 a.m. to 4:30 p.m. Monday - Friday. Please note that voicemails left after 4:00 p.m. may not be returned until the following business day.  We are closed weekends and major holidays. You have access to a nurse at all times for urgent questions. Please call the main number to the clinic Dept: [phone] and follow the prompts.   For any non-urgent questions, you may also contact your provider using MyChart. We now offer e-Visits for anyone 39 and older to request care online for non-urgent symptoms. For details visit [email or web address].   Also download the MyChart app! Go to the app store, search "MyChart", open the app, select [facility], and log in with your MyChart username and password.

## 2022-03-17 ENCOUNTER — Ambulatory Visit
Admission: RE | Admit: 2022-03-17 | Discharge: 2022-03-17 | Disposition: A | Discharge status: Home / Self Care | Payer: Medicare Other | Source: Ambulatory Visit | Attending: Radiation Oncology | Admitting: Radiation Oncology

## 2022-03-17 DIAGNOSIS — C7931 Secondary malignant neoplasm of brain: Secondary | ICD-10-CM | POA: Diagnosis not present

## 2022-03-17 MED ORDER — GADOPICLENOL 0.5 MMOL/ML IV SOLN
8.0000 mL | Freq: Once | INTRAVENOUS | Status: AC | PRN
  Administered 2022-03-17: 8 mL via INTRAVENOUS

## 2022-03-18 ENCOUNTER — Encounter: Payer: Self-pay | Admitting: Internal Medicine

## 2022-03-22 ENCOUNTER — Ambulatory Visit (HOSPITAL_COMMUNITY)
Admission: RE | Admit: 2022-03-22 | Discharge: 2022-03-22 | Disposition: A | Discharge status: Home / Self Care | Payer: Medicare Other | Source: Ambulatory Visit | Attending: Physician Assistant | Admitting: Physician Assistant

## 2022-03-22 ENCOUNTER — Telehealth: Payer: Self-pay

## 2022-03-22 ENCOUNTER — Telehealth: Payer: Self-pay | Admitting: Physician Assistant

## 2022-03-22 DIAGNOSIS — K5730 Diverticulosis of large intestine without perforation or abscess without bleeding: Secondary | ICD-10-CM | POA: Diagnosis not present

## 2022-03-22 DIAGNOSIS — R918 Other nonspecific abnormal finding of lung field: Secondary | ICD-10-CM | POA: Diagnosis not present

## 2022-03-22 DIAGNOSIS — C3492 Malignant neoplasm of unspecified part of left bronchus or lung: Secondary | ICD-10-CM | POA: Diagnosis not present

## 2022-03-22 NOTE — Telephone Encounter (Signed)
This nurse received a call from this patient stating that since she started taking the Augmentin she has had some swelling in her lips.  She has stopped taking the Augmentin and took some Benadryl and the swelling has gone down a lot.  She states that her nasal drainage has cleared up some as well.  This nurse advised that the provider will be made aware.  No further questions or concerns noted at this time.

## 2022-03-22 NOTE — Telephone Encounter (Signed)
I called the patient to review her scan since I had seen her last week and her next follow-up is not for another 2 weeks or so.  Her scan looks stable we will discuss in more depth at her next appointment.  We have also added Augmentin to her allergy list as she was recently prescribed this for sinus infection.  Benadryl has improved her symptoms and she is advised to take Benadryl at night for the next 2 to 3 days. She expressed understanding

## 2022-03-23 DIAGNOSIS — C7931 Secondary malignant neoplasm of brain: Secondary | ICD-10-CM | POA: Diagnosis not present

## 2022-03-25 ENCOUNTER — Ambulatory Visit: Payer: Medicare Other | Admitting: Internal Medicine

## 2022-03-25 ENCOUNTER — Other Ambulatory Visit: Payer: Self-pay

## 2022-03-25 ENCOUNTER — Inpatient Hospital Stay (HOSPITAL_BASED_OUTPATIENT_CLINIC_OR_DEPARTMENT_OTHER): Payer: Medicare Other | Admitting: Internal Medicine

## 2022-03-25 VITALS — BP 114/58 | HR 63 | Temp 97.2°F | Resp 20 | Wt 183.2 lb

## 2022-03-25 DIAGNOSIS — C7931 Secondary malignant neoplasm of brain: Secondary | ICD-10-CM | POA: Diagnosis not present

## 2022-03-25 DIAGNOSIS — C3412 Malignant neoplasm of upper lobe, left bronchus or lung: Secondary | ICD-10-CM | POA: Diagnosis not present

## 2022-03-25 DIAGNOSIS — Z79899 Other long term (current) drug therapy: Secondary | ICD-10-CM | POA: Diagnosis not present

## 2022-03-25 DIAGNOSIS — Z5112 Encounter for antineoplastic immunotherapy: Secondary | ICD-10-CM | POA: Diagnosis not present

## 2022-03-25 MED ORDER — DEXAMETHASONE 4 MG PO TABS: 4.0000 mg | ORAL_TABLET | Freq: Every day | ORAL | 0 refills | Status: DC

## 2022-03-25 NOTE — Progress Notes (Signed)
[facility] [place], [place] [address] [phone]   Interval Evaluation  Date of Service: 03/25/22 Patient Name: Carolyn [name] Patient MRN: [id number] Patient DOB: April 08, 1955 Provider: Ventura Sellers, MD  Identifying Statement:  Carolyn [name] is a 67 y.o. female with Malignant neoplasm metastatic to brain [facility]) [C79.31]   Primary Cancer:  Oncologic History: Oncology History  Adenocarcinoma, lung ([place])  09/12/2018 Initial Diagnosis   Adenocarcinoma, lung ([place])   10/05/2018 -  Chemotherapy   Patient is on Treatment Plan : LUNG Carboplatin (5) + Pemetrexed (500) + Pembrolizumab (200) D1 q21d Induction x 4 cycles / Maintenance Pemetrexed (500) + Pembrolizumab (200) D1 q21d     05/14/2020 Cancer Staging   Staging form: Lung, AJCC 8th Edition - Clinical: Stage IVB (cT1c, cN1, cM1c) - Signed by Curt Bears, MD on 05/14/2020    CNS Oncologic History: 09/22/18: Completes SRS to 9 sub-cm metastases Isidore Moos) 09/02/20: SRS x1 [facility]idore Moos) 12/15/20: SRS R frontal (SS)  Interval History:  Carolyn [name] presents today for follow up after recent MRI brain.  She denies new or progressive neurologic deficits. No severe headaches reported. Remains phsyically and mentally active. Continues with Keytruda through Dr. Julien Nordmann.  Medications: Current Outpatient Medications on File Prior to Visit  Medication Sig Dispense Refill   acetaminophen (TYLENOL) 500 MG tablet Take 1,000 mg by mouth every 8 (eight) hours as needed for mild pain, fever or headache.      amLODipine (NORVASC) 10 MG tablet Take 1 tablet by mouth once daily 90 tablet 0   aspirin EC 81 MG tablet Take 81 mg by mouth daily.     carvedilol (COREG) 25 MG tablet Take 1/2 (one-half) tablet by mouth twice daily 180 tablet 2   Cholecalciferol (VITAMIN D-3) 125 MCG (5000 UT) TABS Take 5,000 Units by mouth daily.     empagliflozin (JARDIANCE) 25 MG TABS tablet Take 1 tablet  (25 mg total) by mouth daily before breakfast. 90 tablet 3   esomeprazole (NEXIUM) 20 MG capsule Take 20 mg by mouth daily with breakfast.     ezetimibe (ZETIA) 10 MG tablet TAKE 1 TABLET BY MOUTH ONCE DAILY AFTER SUPPER 90 tablet 0   fexofenadine (ALLEGRA) 180 MG tablet Take 180 mg by mouth daily.     fluticasone (FLONASE) 50 MCG/ACT nasal spray Place 1 spray into both nostrils daily as needed for allergies or rhinitis.     hydroxychloroquine (PLAQUENIL) 200 MG tablet Take 200 mg by mouth daily.     Lancets (ONETOUCH DELICA PLUS [id number]) MISC SMARTSIG:1 Topical 3 Times Daily     lidocaine-prilocaine (EMLA) cream Apply to the Port-A-Cath site 30 to 60 minutes before chemotherapy. 30 g 2   LIVALO 4 MG TABS TAKE 1 TABLET BY MOUTH AT BEDTIME 90 tablet 0   metFORMIN (GLUCOPHAGE) 500 MG tablet Take 500 mg by mouth daily with supper.     methocarbamol (ROBAXIN) 500 MG tablet Take 1 tablet (500 mg total) by mouth every 8 (eight) hours as needed for muscle spasms. 30 tablet 0   montelukast (SINGULAIR) 10 MG tablet Take 10 mg by mouth daily as needed (allergies).     nitroGLYCERIN (NITROSTAT) 0.4 MG SL tablet Place 1 tablet (0.4 mg total) under the tongue every 5 (five) minutes as needed for chest pain. 25 tablet 2   ONETOUCH ULTRA test strip USE 1 STRIP TO CHECK GLUCOSE THREE TIMES DAILY     Polyethyl Glycol-Propyl Glycol (  SYSTANE) 0.4-0.3 % GEL ophthalmic gel Place 1 application. into both eyes every 6 (six) hours as needed (dry eyes).     telmisartan (MICARDIS) 40 MG tablet Take 40 mg by mouth daily.     vitamin E 180 MG (400 UNITS) capsule Take 400 IU daily x 1 week, then 400 IU BID (Patient taking differently: Take 400 Units by mouth in the morning and at bedtime.) 60 capsule 5   No current facility-administered medications on file prior to visit.    Allergies:  Allergies  Allergen Reactions   Atorvastatin Other (See Comments)    Severe myalgia   Crestor [Rosuvastatin] Other (See Comments)     Severe myalgias   Lisinopril Swelling    Lips and face swelled up.   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   Hypertension    nscl ca dx'd 08/07/18   Persistent headaches    h/o   Pneumonia    hx of PNA   Shortness of breath    Vaginal dryness    h/o   Past Surgical History:  Past Surgical History:  Procedure Laterality Date   ABDOMINAL HYSTERECTOMY     bone spur     CARDIAC CATHETERIZATION  06/13/2012   carpel tunnel surgery Left    COLONOSCOPY     9 years ago   CORONARY ANGIOPLASTY  06/13/2012   EYE SURGERY     cyst left eye    IR IMAGING GUIDED PORT INSERTION  10/10/2018   LEFT HEART CATHETERIZATION WITH CORONARY ANGIOGRAM N/A 06/13/2012   Procedure: LEFT HEART CATHETERIZATION WITH CORONARY ANGIOGRAM;  Surgeon: Laverda Page, MD;  Location: [facility] CATH LAB;  Service: Cardiovascular;  Laterality: N/A;   NECK SURGERY     rotator cuff surgery Right 2015   SHOULDER ARTHROSCOPY WITH ROTATOR CUFF REPAIR AND SUBACROMIAL DECOMPRESSION Left 05/14/2021   Procedure: LEFT SHOULDER ARTHROSCOPY, SUBACROMIAL DECOMPRESSION, BICEPS TENODESIS, MINI OPEN ROTATOR CUFF REPAIR;  Surgeon: Meredith Pel, MD;  Location: [place];  Service: Orthopedics;  Laterality: Left;   VIDEO BRONCHOSCOPY WITH ENDOBRONCHIAL NAVIGATION N/A 09/06/2018   Procedure: VIDEO BRONCHOSCOPY WITH ENDOBRONCHIAL NAVIGATION, left upper lung;  Surgeon: Lajuana Matte, MD;  Location: [facility] OR;   Service: Thoracic;  Laterality: N/A;   VIDEO BRONCHOSCOPY WITH ENDOBRONCHIAL ULTRASOUND Left 09/06/2018   Procedure: VIDEO BRONCHOSCOPY WITH ENDOBRONCHIAL ULTRASOUND, left lung;  Surgeon: Lajuana Matte, MD;  Location: [facility] OR;  Service: Thoracic;  Laterality: Left;   WISDOM TOOTH EXTRACTION     Social History:  Social History   Socioeconomic History   Marital status: Married    Spouse name: Not on file   Number of children: 3   Years of education: Not on file   Highest education level: Not on file  Occupational History   Not on file  Tobacco Use   Smoking status: Former    Packs/day: 0.50    Years: 30.00    Total pack years: 15.00    Types: Cigarettes    Quit date: 02/09/1999    Years since quitting: 23.1   Smokeless tobacco: Never  Vaping Use   Vaping Use: Never used  Substance and Sexual Activity   Alcohol use: Not Currently   Drug use: Not Currently   Sexual activity: Yes    Birth control/protection: Post-menopausal  Other Topics Concern   Not on file  Social History Narrative   Not on file   Social Determinants of Health   Financial Resource Strain: Not on file  Food Insecurity: Not on file  Transportation Needs: No Transportation Needs (08/29/2018)   PRAPARE - Hydrologist (Medical): No    Lack of Transportation (Non-Medical): No  Physical Activity: Not on file  Stress: Not on file  Social Connections: Not on file  Intimate Partner Violence: Not At Risk (08/29/2018)   Humiliation, Afraid, Rape, and Kick questionnaire    Fear of Current or Ex-Partner: No    Emotionally Abused: No    Physically Abused: No    Sexually Abused: No   Family History:  Family History  Problem Relation Age of Onset   Breast cancer Other    Diabetes Mother    Glaucoma Mother    Hypertension Mother    Hypertension Father    Asthma Father    Diabetes Sister    Diabetes Brother    Hypertension Sister     Review of Systems: Constitutional:  Doesn't report fevers, chills or abnormal weight loss Eyes: Doesn't report blurriness of vision Ears, nose, mouth, throat, and face: Doesn't report sore throat Respiratory: Doesn't report cough, dyspnea or wheezes Cardiovascular: Doesn't report palpitation, chest discomfort  Gastrointestinal:  Doesn't report nausea, constipation, diarrhea GU: Doesn't report incontinence Skin: Doesn't report skin rashes Neurological: Per HPI Musculoskeletal: Doesn't report joint pain Behavioral/Psych: Doesn't report anxiety  Physical Exam: Vitals:   03/25/22 1450  BP: (!) 114/58  Pulse: 63  Resp: 20  Temp: (!) 97.2 F (36.2 C)  SpO2: 99%   KPS: 90. General: Alert, cooperative, pleasant, in no acute distress Head: Normal EENT: No conjunctival injection or scleral icterus.  Lungs: Resp effort normal Cardiac: Regular rate Abdomen: Non-distended abdomen Skin: No rashes cyanosis or petechiae. Extremities: No clubbing or edema  Neurologic Exam: Mental Status: Awake, alert, attentive to examiner. Oriented to self and environment. Language is fluent with intact comprehension.  Cranial Nerves: Visual acuity is grossly normal. Visual fields are full. Extra-ocular movements intact. No ptosis. Face is symmetric Motor: Tone and bulk are normal. Power is full in both arms and legs. Reflexes are symmetric, no pathologic reflexes present.  Sensory: Intact to light touch Gait: Normal.   Labs: I have reviewed the data as listed    Component Value Date/Time   NA 140 03/16/2022 0739   NA 139 08/18/2020 0801   K 4.0 03/16/2022 0739   CL 106 03/16/2022 0739   CO2 26 03/16/2022 0739   GLUCOSE 138 (H) 03/16/2022 0739   BUN 12 03/16/2022 0739   BUN 16 08/18/2020 0801   CREATININE 1.51 (H) 03/16/2022 0739   CALCIUM 8.9 03/16/2022 0739   PROT 6.4 (L) 03/16/2022 0739   ALBUMIN 3.7 03/16/2022 0739   AST 18 03/16/2022 0739   ALT 12 03/16/2022 0739   ALKPHOS 86 03/16/2022 0739   BILITOT 0.4 03/16/2022 0739  GFRNONAA 37 (L) 03/16/2022 0739   GFRAA 37 (L) 11/12/2019 0917   GFRAA 37 (L) 11/08/2019 0900   Lab Results  Component Value Date   WBC 6.0 03/16/2022   NEUTROABS 3.4 03/16/2022   HGB 12.0 03/16/2022   HCT 39.2 03/16/2022   MCV 79.7 (L) 03/16/2022   PLT 242 03/16/2022    Imaging:  CT Chest Wo Contrast  Result Date: 03/22/2022 CLINICAL DATA:  Metastatic disease evaluation; * Tracking Code: BO * EXAM: CT CHEST, ABDOMEN AND PELVIS WITHOUT CONTRAST TECHNIQUE: Multidetector CT imaging of the chest, abdomen and pelvis was performed following the standard protocol without IV contrast. RADIATION DOSE REDUCTION: This exam was performed according to the departmental dose-optimization program which includes automated exposure control, adjustment of the mA and/or kV according to patient size and/or use of iterative reconstruction technique. COMPARISON:  CT chest, pelvis dated November 30 2021 FINDINGS: CT CHEST FINDINGS Cardiovascular: Normal heart size. Stable small pericardial effusion. Normal caliber thoracic aorta with severe calcified plaque. Severe coronary artery calcifications. Mediastinum/Nodes: Small hiatal hernia. Thyroid is unremarkable. Pathologically enlarged lymph nodes seen in the chest. Lungs/Pleura: Central airways are patent. Mild Paraseptal emphysema. Stable nodular opacities of the left lobe measuring 8 mm with adjacent linear opacities which are compatible with postradiation change. Linear nodular opacity of the right upper lobe located on series 4, image 36, unchanged when compared with the prior. Additional small scattered ground-glass pulmonary nodules are seen which are stable when compared with prior exam. Reference 5 mm ground-glass nodule of the right middle lobe located on series 4, image 69. Musculoskeletal: No chest wall mass or suspicious bone lesions identified. CT ABDOMEN PELVIS FINDINGS Hepatobiliary: No focal liver abnormality is seen. No gallstones, gallbladder wall  thickening, or biliary dilatation. Pancreas: Unremarkable. No pancreatic ductal dilatation or surrounding inflammatory changes. Spleen: Normal in size without focal abnormality. Adrenals/Urinary Tract: Bilateral adrenal glands are unremarkable. No hydronephrosis or nephrolithiasis. Bladder is unremarkable. Stomach/Bowel: Stomach is within normal limits. Appendix appears normal. Diverticulosis. No evidence of bowel wall thickening, distention, or inflammatory changes. Vascular/Lymphatic: Aortic atherosclerosis. No enlarged abdominal or pelvic lymph nodes. Reproductive: Status post hysterectomy. No adnexal masses. Other: No abdominal wall hernia or abnormality. No abdominopelvic ascites. Musculoskeletal: Mild grade 1 anterolisthesis of L4 on L5. No acute or significant osseous findings. IMPRESSION: 1. Stable posttreatment changes of the left upper lobe. No evidence of recurrent or progressive disease. 2. Additional small scattered pulmonary nodules are stable when compared with the prior exam. 3. No evidence of metastatic disease in the abdomen or pelvis. 4. Aortic Atherosclerosis (ICD10-I70.0) and Emphysema (ICD10-J43.9). Electronically Signed   By: Yetta Glassman M.D.   On: 03/22/2022 11:22   CT Abdomen Pelvis Wo Contrast  Result Date: 03/22/2022 CLINICAL DATA:  Metastatic disease evaluation; * Tracking Code: BO * EXAM: CT CHEST, ABDOMEN AND PELVIS WITHOUT CONTRAST TECHNIQUE: Multidetector CT imaging of the chest, abdomen and pelvis was performed following the standard protocol without IV contrast. RADIATION DOSE REDUCTION: This exam was performed according to the departmental dose-optimization program which includes automated exposure control, adjustment of the mA and/or kV according to patient size and/or use of iterative reconstruction technique. COMPARISON:  CT chest, pelvis dated November 30 2021 FINDINGS: CT CHEST FINDINGS Cardiovascular: Normal heart size. Stable small pericardial effusion. Normal  caliber thoracic aorta with severe calcified plaque. Severe coronary artery calcifications. Mediastinum/Nodes: Small hiatal hernia. Thyroid is unremarkable. Pathologically enlarged lymph nodes seen in the chest. Lungs/Pleura: Central airways are patent. Mild Paraseptal emphysema. Stable nodular opacities  of the left lobe measuring 8 mm with adjacent linear opacities which are compatible with postradiation change. Linear nodular opacity of the right upper lobe located on series 4, image 36, unchanged when compared with the prior. Additional small scattered ground-glass pulmonary nodules are seen which are stable when compared with prior exam. Reference 5 mm ground-glass nodule of the right middle lobe located on series 4, image 69. Musculoskeletal: No chest wall mass or suspicious bone lesions identified. CT ABDOMEN PELVIS FINDINGS Hepatobiliary: No focal liver abnormality is seen. No gallstones, gallbladder wall thickening, or biliary dilatation. Pancreas: Unremarkable. No pancreatic ductal dilatation or surrounding inflammatory changes. Spleen: Normal in size without focal abnormality. Adrenals/Urinary Tract: Bilateral adrenal glands are unremarkable. No hydronephrosis or nephrolithiasis. Bladder is unremarkable. Stomach/Bowel: Stomach is within normal limits. Appendix appears normal. Diverticulosis. No evidence of bowel wall thickening, distention, or inflammatory changes. Vascular/Lymphatic: Aortic atherosclerosis. No enlarged abdominal or pelvic lymph nodes. Reproductive: Status post hysterectomy. No adnexal masses. Other: No abdominal wall hernia or abnormality. No abdominopelvic ascites. Musculoskeletal: Mild grade 1 anterolisthesis of L4 on L5. No acute or significant osseous findings. IMPRESSION: 1. Stable posttreatment changes of the left upper lobe. No evidence of recurrent or progressive disease. 2. Additional small scattered pulmonary nodules are stable when compared with the prior exam. 3. No evidence  of metastatic disease in the abdomen or pelvis. 4. Aortic Atherosclerosis (ICD10-I70.0) and Emphysema (ICD10-J43.9). Electronically Signed   By: Yetta Glassman M.D.   On: 03/22/2022 11:22   MR Brain W Wo Contrast  Result Date: 03/19/2022 CLINICAL DATA:  Brain metastases, assess treatment response. EXAM: MRI HEAD WITHOUT AND WITH CONTRAST TECHNIQUE: Multiplanar, multiecho pulse sequences of the brain and surrounding structures were obtained without and with intravenous contrast. CONTRAST:  8 cc of vueway intravenous COMPARISON:  11/19/2021 FINDINGS: BRAIN New Lesions: None. Larger lesions: Continued enlargement of high and anterior right frontal lesion with centrally necrotic features, measuring up to 2.1 cm with increased vasogenic edema, localized on 15:125. Stable or Smaller lesions: 1. A punctate lesion in the inferior left cerebellum on 15:29 remains stable. 2. 3 mm lesion posterior to the atrium of the left lateral ventricle on 15:86 remains stable. 3. Wispy enhancement at the anterior left temporal lobe on 15:63 has subsided. Other Brain findings: Ovoid 4 mm nodule at the genu of the left facial nerve is unchanged and again likely a incidental schwannoma or accentuated vein. No infarct, hemorrhage, hydrocephalus, or collection. Vascular: Major flow voids and vascular enhancements are preserved Skull and upper cervical spine: Normal marrow signal Sinuses/Orbits: No acute or masslike finding IMPRESSION: 1. Continue lesion growth in vasogenic edema in the superior right frontal lobe, enhancing area now measuring 2.1 cm (8 mm in October 2023). 2. Other lesions remain stable as described. Electronically Signed   By: Jorje Guild M.D.   On: 03/19/2022 06:12     [place] Clinician Interpretation: I have personally reviewed the radiological images as listed.  My interpretation, in the context of the patient's clinical presentation, is treatment effect vs true progression   Assessment/Plan Malignant neoplasm  metastatic to brain [facility]) [C79.31]  Carolyn [name] is clinicallly stable today.  MRI brain demonstrates significant progression of R frontal metastasis, previously treated with [facility] November 2022.  There is significant surrounding edema.  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  We recommended dosing decadron 4mg  daily x10 days prior to next MRI study, which we will recommend in 1 month.  Will limit steroid exposure given lack  of focal symptoms, concurrent immunotherapy.  If progression continues, will consider advanced imaging or other interventions.  We ask that Carolyn [name] return to clinic in 1 months following next brain MRI, or sooner as needed.  May treat headaches with PRN Tylenol as prior.  We appreciate the opportunity to participate in the care of Carolyn [name].   All questions were answered. The patient knows to call the clinic with any problems, questions or concerns. No barriers to learning were detected.  I have spent a total of 30 minutes of face-to-face and non-face-to-face time, excluding clinical staff time, preparing to see patient, ordering tests and/or medications, counseling the patient, and independently interpreting results and communicating results to the patient/family/caregiver    Ventura Sellers, MD Medical Director of Neuro-Oncology [facility] at [place] 03/25/22 3:10 PM

## 2022-03-30 ENCOUNTER — Other Ambulatory Visit: Payer: Self-pay | Admitting: Radiation Therapy

## 2022-03-30 DIAGNOSIS — J3089 Other allergic rhinitis: Secondary | ICD-10-CM | POA: Diagnosis not present

## 2022-04-06 ENCOUNTER — Encounter: Payer: Self-pay | Admitting: Medical Oncology

## 2022-04-06 ENCOUNTER — Inpatient Hospital Stay: Payer: Medicare Other

## 2022-04-06 ENCOUNTER — Encounter: Payer: Self-pay | Admitting: Internal Medicine

## 2022-04-06 ENCOUNTER — Other Ambulatory Visit: Payer: Self-pay | Admitting: Internal Medicine

## 2022-04-06 ENCOUNTER — Other Ambulatory Visit: Payer: Self-pay

## 2022-04-06 ENCOUNTER — Inpatient Hospital Stay (HOSPITAL_BASED_OUTPATIENT_CLINIC_OR_DEPARTMENT_OTHER): Payer: Medicare Other | Admitting: Internal Medicine

## 2022-04-06 DIAGNOSIS — C3492 Malignant neoplasm of unspecified part of left bronchus or lung: Secondary | ICD-10-CM | POA: Diagnosis not present

## 2022-04-06 DIAGNOSIS — C7931 Secondary malignant neoplasm of brain: Secondary | ICD-10-CM | POA: Diagnosis not present

## 2022-04-06 DIAGNOSIS — Z5112 Encounter for antineoplastic immunotherapy: Secondary | ICD-10-CM | POA: Diagnosis not present

## 2022-04-06 DIAGNOSIS — Z79899 Other long term (current) drug therapy: Secondary | ICD-10-CM | POA: Diagnosis not present

## 2022-04-06 DIAGNOSIS — Z95828 Presence of other vascular implants and grafts: Secondary | ICD-10-CM

## 2022-04-06 DIAGNOSIS — C3412 Malignant neoplasm of upper lobe, left bronchus or lung: Secondary | ICD-10-CM | POA: Diagnosis not present

## 2022-04-06 LAB — CMP (CANCER CENTER ONLY)
ALT: 11 U/L (ref 0–44)
AST: 16 U/L (ref 15–41)
Albumin: 3.9 g/dL (ref 3.5–5.0)
Alkaline Phosphatase: 84 U/L (ref 38–126)
Anion gap: 6 (ref 5–15)
BUN: 21 mg/dL (ref 8–23)
CO2: 28 mmol/L (ref 22–32)
Calcium: 8.6 mg/dL — ABNORMAL LOW (ref 8.9–10.3)
Chloride: 107 mmol/L (ref 98–111)
Creatinine: 1.61 mg/dL — ABNORMAL HIGH (ref 0.44–1.00)
GFR, Estimated: 35 mL/min — ABNORMAL LOW
Glucose, Bld: 102 mg/dL — ABNORMAL HIGH (ref 70–99)
Potassium: 4.2 mmol/L (ref 3.5–5.1)
Sodium: 141 mmol/L (ref 135–145)
Total Bilirubin: 0.3 mg/dL (ref 0.3–1.2)
Total Protein: 6.9 g/dL (ref 6.5–8.1)

## 2022-04-06 LAB — CBC WITH DIFFERENTIAL (CANCER CENTER ONLY)
Abs Immature Granulocytes: 0.01 K/uL (ref 0.00–0.07)
Basophils Absolute: 0 K/uL (ref 0.0–0.1)
Basophils Relative: 1 %
Eosinophils Absolute: 0.6 K/uL — ABNORMAL HIGH (ref 0.0–0.5)
Eosinophils Relative: 10 %
HCT: 39.2 % (ref 36.0–46.0)
Hemoglobin: 12.1 g/dL (ref 12.0–15.0)
Immature Granulocytes: 0 %
Lymphocytes Relative: 19 %
Lymphs Abs: 1.2 K/uL (ref 0.7–4.0)
MCH: 24.6 pg — ABNORMAL LOW (ref 26.0–34.0)
MCHC: 30.9 g/dL (ref 30.0–36.0)
MCV: 79.7 fL — ABNORMAL LOW (ref 80.0–100.0)
Monocytes Absolute: 0.6 K/uL (ref 0.1–1.0)
Monocytes Relative: 9 %
Neutro Abs: 4 K/uL (ref 1.7–7.7)
Neutrophils Relative %: 61 %
Platelet Count: 228 K/uL (ref 150–400)
RBC: 4.92 MIL/uL (ref 3.87–5.11)
RDW: 16.2 % — ABNORMAL HIGH (ref 11.5–15.5)
WBC Count: 6.4 K/uL (ref 4.0–10.5)
nRBC: 0 % (ref 0.0–0.2)

## 2022-04-06 LAB — TSH: TSH: 2.32 u[IU]/mL (ref 0.350–4.500)

## 2022-04-06 MED ORDER — SODIUM CHLORIDE 0.9% FLUSH
10.0000 mL | INTRAVENOUS | Status: DC | PRN
  Administered 2022-04-06: 10 mL

## 2022-04-06 MED ORDER — SODIUM CHLORIDE 0.9 % IV SOLN: Freq: Once | INTRAVENOUS | Status: AC

## 2022-04-06 MED ORDER — HEPARIN SOD (PORK) LOCK FLUSH 100 UNIT/ML IV SOLN
500.0000 [IU] | Freq: Once | INTRAVENOUS | Status: AC | PRN
  Administered 2022-04-06: 500 [IU]

## 2022-04-06 MED ORDER — SODIUM CHLORIDE 0.9 % IV SOLN
200.0000 mg | Freq: Once | INTRAVENOUS | Status: AC
  Administered 2022-04-06: 200 mg via INTRAVENOUS
  Filled 2022-04-06: qty 8

## 2022-04-06 NOTE — Progress Notes (Signed)
[place] Telephone:(336) [phone]   [phone]  OFFICE PROGRESS NOTE  [name], [name] [address]  DIAGNOSIS: Stage IV (T1c, N1, M1c ) non-small cell lung cancer, adenocarcinoma diagnosed in July 2020 and presented with left upper lobe lung nodule in addition to right upper lobe lung nodule with left hilar lymphadenopathy as well as multiple brain metastasis.  Biomarker Findings Microsatellite status - MS-Stable Tumor Mutational Burden - 4 Muts/Mb Genomic Findings For a complete list of the genes assayed, please refer to the Appendix. ATM G204* KRAS G12C PDGFRA P122T SMO R199W 7 Disease relevant genes with no reportable alterations: ALK, BRAF, EGFR, ERBB2, MET, RET, ROS1  PDL 1 expression was 1%  PRIOR THERAPY:  1) SRS to multiple brain metastasis under the care of Dr. Isidore Moos. 2) SRS to a 4 mm new brain metastasis under the care of Dr. Isidore Moos and Dr. Saintclair Halsted on 09/02/2020  CURRENT THERAPY: Systemic chemotherapy with carboplatin for AUC of 5, Alimta 500 mg/M2 and Keytruda 200 mg IV every 3 weeks.  First dose October 05, 2018.  Status post 61 cycles.  Starting from cycle #5 the patient will be on treatment with maintenance Alimta 500 mg/M2 and Keytruda 200 mg IV every 3 weeks.  She has been on treatment with single agent Keytruda starting from cycle #9 secondary to renal insufficiency.  INTERVAL HISTORY: Carolyn [name] 67 y.o. female returns to the clinic today for follow-up visit.  The patient is feeling fine today with no concerning complaints except for pain in the right arm after the IV contrast for the MRI few weeks ago.  She has no erythema or inflammation in the area.  She denied having any current chest pain but has shortness of breath with exertion with no cough or hemoptysis.  She has no nausea, vomiting, diarrhea or constipation.  She has no headache or visual changes.  She continues to tolerate her  treatment with Keytruda fairly well.  The patient is here today for evaluation with repeat CT scan of the chest, abdomen and pelvis for restaging of her disease.   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  MEDICATIONS:  Current Outpatient Medications  Medication Sig Dispense Refill   acetaminophen (TYLENOL) 500 MG tablet Take 1,000 mg by mouth every 8 (eight) hours as needed for mild pain, fever or headache.      amLODipine (NORVASC) 10 MG tablet Take 1 tablet by mouth once daily 90 tablet 0   aspirin EC 81 MG tablet Take 81 mg  by mouth daily.     carvedilol (COREG) 25 MG tablet Take 1/2 (one-half) tablet by mouth twice daily 180 tablet 2   Cholecalciferol (VITAMIN D-3) 125 MCG (5000 UT) TABS Take 5,000 Units by mouth daily.     [START ON 04/13/2022] dexamethasone (DECADRON) 4 MG tablet Take 1 tablet (4 mg total) by mouth daily. 10 tablet 0   empagliflozin (JARDIANCE) 25 MG TABS tablet Take 1 tablet (25 mg total) by mouth daily before breakfast. 90 tablet 3   esomeprazole (NEXIUM) 20 MG capsule Take 20 mg by mouth daily with breakfast.     ezetimibe (ZETIA) 10 MG tablet TAKE 1 TABLET BY MOUTH ONCE DAILY AFTER SUPPER 90 tablet 0   fexofenadine (ALLEGRA) 180 MG tablet Take 180 mg by mouth  daily.     fluticasone (FLONASE) 50 MCG/ACT nasal spray Place 1 spray into both nostrils daily as needed for allergies or rhinitis.     hydroxychloroquine (PLAQUENIL) 200 MG tablet Take 200 mg by mouth daily.     Lancets (ONETOUCH DELICA PLUS [id number]) MISC SMARTSIG:1 Topical 3 Times Daily     lidocaine-prilocaine (EMLA) cream Apply to the Port-A-Cath site 30 to 60 minutes before chemotherapy. 30 g 2   LIVALO 4 MG TABS TAKE 1 TABLET BY MOUTH AT BEDTIME 90 tablet 0   metFORMIN (GLUCOPHAGE) 500 MG tablet Take 500 mg by mouth daily with supper.     methocarbamol (ROBAXIN) 500 MG tablet Take 1 tablet (500 mg total) by mouth every 8 (eight) hours as needed for muscle spasms. 30 tablet 0   montelukast (SINGULAIR) 10 MG tablet Take 10 mg by mouth daily as needed (allergies).     nitroGLYCERIN (NITROSTAT) 0.4 MG SL tablet Place 1 tablet (0.4 mg total) under the tongue every 5 (five) minutes as needed for chest pain. 25 tablet 2   ONETOUCH ULTRA test strip USE 1 STRIP TO CHECK GLUCOSE THREE TIMES DAILY     Polyethyl Glycol-Propyl Glycol (SYSTANE) 0.4-0.3 % GEL ophthalmic gel Place 1 application. into both eyes every 6 (six) hours as needed (dry eyes).     telmisartan (MICARDIS) 40 MG tablet Take 40 mg by mouth daily.     No current facility-administered medications for this visit.   Facility-Administered Medications Ordered in Other Visits  Medication Dose Route Frequency Provider Last Rate Last Admin   sodium chloride flush (NS) 0.9 % injection 10 mL  10 mL Intracatheter PRN Curt Bears, MD   10 mL at 04/06/22 2423    SURGICAL HISTORY:  Past Surgical History:  Procedure Laterality Date   ABDOMINAL HYSTERECTOMY     bone spur     CARDIAC CATHETERIZATION  06/13/2012   carpel tunnel surgery Left    COLONOSCOPY     9 years ago   CORONARY ANGIOPLASTY  06/13/2012   EYE SURGERY     cyst left eye    IR IMAGING GUIDED PORT INSERTION  10/10/2018   LEFT HEART CATHETERIZATION WITH CORONARY ANGIOGRAM  N/A 06/13/2012   Procedure: LEFT HEART CATHETERIZATION WITH CORONARY ANGIOGRAM;  Surgeon: Laverda Page, MD;  Location: [facility] CATH LAB;  Service: Cardiovascular;  Laterality: N/A;   NECK SURGERY     rotator cuff surgery Right 2015   SHOULDER ARTHROSCOPY WITH ROTATOR CUFF REPAIR AND SUBACROMIAL DECOMPRESSION Left 05/14/2021   Procedure: LEFT SHOULDER ARTHROSCOPY, SUBACROMIAL DECOMPRESSION, BICEPS TENODESIS, MINI OPEN ROTATOR CUFF REPAIR;  Surgeon: Meredith Pel, MD;  Location: [facility];  Service: Orthopedics;  Laterality: Left;   VIDEO  BRONCHOSCOPY WITH ENDOBRONCHIAL NAVIGATION N/A 09/06/2018   Procedure: VIDEO BRONCHOSCOPY WITH ENDOBRONCHIAL NAVIGATION, left upper lung;  Surgeon: Lajuana Matte, MD;  Location: [facility];  Service: Thoracic;  Laterality: N/A;   VIDEO BRONCHOSCOPY WITH ENDOBRONCHIAL ULTRASOUND Left 09/06/2018   Procedure: VIDEO BRONCHOSCOPY WITH ENDOBRONCHIAL ULTRASOUND, left lung;  Surgeon: Lajuana Matte, MD;  Location: [facility];  Service: Thoracic;  Laterality: Left;   WISDOM TOOTH EXTRACTION      REVIEW OF SYSTEMS:  Constitutional: positive for fatigue Eyes: negative Ears, nose, mouth, throat, and face: negative Respiratory: negative Cardiovascular: negative Gastrointestinal: negative Genitourinary:negative Integument/breast: negative Hematologic/lymphatic: negative Musculoskeletal:negative Neurological: negative Behavioral/Psych: negative Endocrine: negative Allergic/Immunologic: negative   PHYSICAL EXAMINATION: General appearance: alert, cooperative, and no distress Head: Normocephalic, without obvious abnormality, atraumatic Neck: no adenopathy, no JVD, supple, symmetrical, trachea midline, and thyroid not enlarged, symmetric, no tenderness/mass/nodules Lymph nodes: Cervical, supraclavicular, and axillary nodes normal. Resp: clear to auscultation bilaterally Back: symmetric, no curvature. ROM normal. No CVA tenderness. Cardio: regular rate and rhythm, S1, S2  normal, no murmur, click, rub or gallop GI: soft, non-tender; bowel sounds normal; no masses,  no organomegaly Extremities: extremities normal, atraumatic, no cyanosis or edema Neurologic: Alert and oriented X 3, normal strength and tone. Normal symmetric reflexes. Normal coordination and gait  ECOG PERFORMANCE STATUS: 1 - Symptomatic but completely ambulatory  Blood pressure 132/66, pulse (!) 59, temperature 98.3 F (36.8 C), temperature source Oral, resp. rate 17, weight 185 lb 11.2 oz (84.2 kg), SpO2 100 %.  LABORATORY DATA: Lab Results  Component Value Date   WBC 6.0 03/16/2022   HGB 12.0 03/16/2022   HCT 39.2 03/16/2022   MCV 79.7 (L) 03/16/2022   PLT 242 03/16/2022      Chemistry      Component Value Date/Time   NA 140 03/16/2022 0739   NA 139 08/18/2020 0801   K 4.0 03/16/2022 0739   CL 106 03/16/2022 0739   CO2 26 03/16/2022 0739   BUN 12 03/16/2022 0739   BUN 16 08/18/2020 0801   CREATININE 1.51 (H) 03/16/2022 0739      Component Value Date/Time   CALCIUM 8.9 03/16/2022 0739   ALKPHOS 86 03/16/2022 0739   AST 18 03/16/2022 0739   ALT 12 03/16/2022 0739   BILITOT 0.4 03/16/2022 0739       RADIOGRAPHIC STUDIES: CT Chest Wo Contrast  Result Date: 03/22/2022 CLINICAL DATA:  Metastatic disease evaluation; * Tracking Code: BO * EXAM: CT CHEST, ABDOMEN AND PELVIS WITHOUT CONTRAST TECHNIQUE: Multidetector CT imaging of the chest, abdomen and pelvis was performed following the standard protocol without IV contrast. RADIATION DOSE REDUCTION: This exam was performed according to the departmental dose-optimization program which includes automated exposure control, adjustment of the mA and/or kV according to patient size and/or use of iterative reconstruction technique. COMPARISON:  CT chest, pelvis dated November 30 2021 FINDINGS: CT CHEST FINDINGS Cardiovascular: Normal heart size. Stable small pericardial effusion. Normal caliber thoracic aorta with severe calcified plaque.  Severe coronary artery calcifications. Mediastinum/Nodes: Small hiatal hernia. Thyroid is unremarkable. Pathologically enlarged lymph nodes seen in the chest. Lungs/Pleura: Central airways are patent. Mild Paraseptal emphysema. Stable nodular opacities of the left lobe measuring 8 mm with adjacent linear opacities which are compatible with postradiation change. Linear nodular opacity of the right upper lobe located on series 4, image 36, unchanged when compared with the prior. Additional small scattered ground-glass pulmonary nodules are seen which are stable when compared with prior exam. Reference 5 mm ground-glass  nodule of the right middle lobe located on series 4, image 69. Musculoskeletal: No chest wall mass or suspicious bone lesions identified. CT ABDOMEN PELVIS FINDINGS Hepatobiliary: No focal liver abnormality is seen. No gallstones, gallbladder wall thickening, or biliary dilatation. Pancreas: Unremarkable. No pancreatic ductal dilatation or surrounding inflammatory changes. Spleen: Normal in size without focal abnormality. Adrenals/Urinary Tract: Bilateral adrenal glands are unremarkable. No hydronephrosis or nephrolithiasis. Bladder is unremarkable. Stomach/Bowel: Stomach is within normal limits. Appendix appears normal. Diverticulosis. No evidence of bowel wall thickening, distention, or inflammatory changes. Vascular/Lymphatic: Aortic atherosclerosis. No enlarged abdominal or pelvic lymph nodes. Reproductive: Status post hysterectomy. No adnexal masses. Other: No abdominal wall hernia or abnormality. No abdominopelvic ascites. Musculoskeletal: Mild grade 1 anterolisthesis of L4 on L5. No acute or significant osseous findings. IMPRESSION: 1. Stable posttreatment changes of the left upper lobe. No evidence of recurrent or progressive disease. 2. Additional small scattered pulmonary nodules are stable when compared with the prior exam. 3. No evidence of metastatic disease in the abdomen or pelvis. 4.  Aortic Atherosclerosis (ICD10-I70.0) and Emphysema (ICD10-J43.9). Electronically Signed   By: Yetta Glassman M.D.   On: 03/22/2022 11:22   CT Abdomen Pelvis Wo Contrast  Result Date: 03/22/2022 CLINICAL DATA:  Metastatic disease evaluation; * Tracking Code: BO * EXAM: CT CHEST, ABDOMEN AND PELVIS WITHOUT CONTRAST TECHNIQUE: Multidetector CT imaging of the chest, abdomen and pelvis was performed following the standard protocol without IV contrast. RADIATION DOSE REDUCTION: This exam was performed according to the departmental dose-optimization program which includes automated exposure control, adjustment of the mA and/or kV according to patient size and/or use of iterative reconstruction technique. COMPARISON:  CT chest, pelvis dated November 30 2021 FINDINGS: CT CHEST FINDINGS Cardiovascular: Normal heart size. Stable small pericardial effusion. Normal caliber thoracic aorta with severe calcified plaque. Severe coronary artery calcifications. Mediastinum/Nodes: Small hiatal hernia. Thyroid is unremarkable. Pathologically enlarged lymph nodes seen in the chest. Lungs/Pleura: Central airways are patent. Mild Paraseptal emphysema. Stable nodular opacities of the left lobe measuring 8 mm with adjacent linear opacities which are compatible with postradiation change. Linear nodular opacity of the right upper lobe located on series 4, image 36, unchanged when compared with the prior. Additional small scattered ground-glass pulmonary nodules are seen which are stable when compared with prior exam. Reference 5 mm ground-glass nodule of the right middle lobe located on series 4, image 69. Musculoskeletal: No chest wall mass or suspicious bone lesions identified. CT ABDOMEN PELVIS FINDINGS Hepatobiliary: No focal liver abnormality is seen. No gallstones, gallbladder wall thickening, or biliary dilatation. Pancreas: Unremarkable. No pancreatic ductal dilatation or surrounding inflammatory changes. Spleen: Normal in size  without focal abnormality. Adrenals/Urinary Tract: Bilateral adrenal glands are unremarkable. No hydronephrosis or nephrolithiasis. Bladder is unremarkable. Stomach/Bowel: Stomach is within normal limits. Appendix appears normal. Diverticulosis. No evidence of bowel wall thickening, distention, or inflammatory changes. Vascular/Lymphatic: Aortic atherosclerosis. No enlarged abdominal or pelvic lymph nodes. Reproductive: Status post hysterectomy. No adnexal masses. Other: No abdominal wall hernia or abnormality. No abdominopelvic ascites. Musculoskeletal: Mild grade 1 anterolisthesis of L4 on L5. No acute or significant osseous findings. IMPRESSION: 1. Stable posttreatment changes of the left upper lobe. No evidence of recurrent or progressive disease. 2. Additional small scattered pulmonary nodules are stable when compared with the prior exam. 3. No evidence of metastatic disease in the abdomen or pelvis. 4. Aortic Atherosclerosis (ICD10-I70.0) and Emphysema (ICD10-J43.9). Electronically Signed   By: Yetta Glassman M.D.   On: 03/22/2022 11:22   MR Brain W  Wo Contrast  Result Date: 03/19/2022 CLINICAL DATA:  Brain metastases, assess treatment response. EXAM: MRI HEAD WITHOUT AND WITH CONTRAST TECHNIQUE: Multiplanar, multiecho pulse sequences of the brain and surrounding structures were obtained without and with intravenous contrast. CONTRAST:  8 cc of vueway intravenous COMPARISON:  11/19/2021 FINDINGS: BRAIN New Lesions: None. Larger lesions: Continued enlargement of high and anterior right frontal lesion with centrally necrotic features, measuring up to 2.1 cm with increased vasogenic edema, localized on 15:125. Stable or Smaller lesions: 1. A punctate lesion in the inferior left cerebellum on 15:29 remains stable. 2. 3 mm lesion posterior to the atrium of the left lateral ventricle on 15:86 remains stable. 3. Wispy enhancement at the anterior left temporal lobe on 15:63 has subsided. Other Brain findings:  Ovoid 4 mm nodule at the genu of the left facial nerve is unchanged and again likely a incidental schwannoma or accentuated vein. No infarct, hemorrhage, hydrocephalus, or collection. Vascular: Major flow voids and vascular enhancements are preserved Skull and upper cervical spine: Normal marrow signal Sinuses/Orbits: No acute or masslike finding IMPRESSION: 1. Continue lesion growth in vasogenic edema in the superior right frontal lobe, enhancing area now measuring 2.1 cm (8 mm in October 2023). 2. Other lesions remain stable as described. Electronically Signed   By: Jorje Guild M.D.   On: 03/19/2022 06:12     ASSESSMENT AND PLAN: This is a very pleasant 67 years old African-American female with likely stage IV (T1c, N1, M1C) non-small cell lung cancer, adenocarcinoma with positive KRAS G12C mutation diagnosed in July 2020 and presented with left upper lobe lung nodule in addition to right upper lobe pulmonary nodule as well as left hilar adenopathy and metastatic lesion to the brain. Molecular studies showed no actionable mutation and PDL 1 expression was 1%. The patient underwent SBRT to the metastatic brain lesion under the care of Dr. Isidore Moos. The patient is currently undergoing systemic chemotherapy with carboplatin for AUC of 5, Alimta 500 mg/M2 and Keytruda 200 mg IV every 3 weeks is status post 61 cycles.  Starting from cycle #5 she is on maintenance treatment with Alimta and Keytruda every 3 weeks with only Keytruda on the last few cycles because of the renal insufficiency. The patient has been tolerating this treatment well with no concerning adverse effects. She had repeat CT scan of the chest, abdomen and pelvis performed recently.  I personally and independently reviewed the scan and discussed the result with the patient today. Her scan showed no concerning findings for disease progression. I discussed with the patient the option of stopping her treatment with Keytruda since she has been  on it for more than 5 years now and most of the indications is to treat for up to 2 years versus continuous treatment send The patient has no toxicity issues and she is responding to the treatment. The patient would like to continue her current treatment with [facility] for now. She will think about the discontinuation at some point. I will see her back for follow-up visit in 3 weeks for evaluation before the next cycle of her treatment. For the brain metastasis she had increase in the size of a superior right frontal lobe lesion with some vasogenic edema.  She is followed by Dr. Mickeal Skinner and Dr. Isidore Moos. The patient was advised to call immediately if she has any concerning symptoms in the interval. The patient voices understanding of current disease status and treatment options and is in agreement with the current care plan. All questions  were answered. The patient knows to call the clinic with any problems, questions or concerns. We can certainly see the patient much sooner if necessary.  Disclaimer: This note was dictated with voice recognition software. Similar sounding words can inadvertently be transcribed and may not be corrected upon review.

## 2022-04-06 NOTE — Progress Notes (Signed)
Patient seen by MD today  Vitals are within treatment parameters.  Labs reviewed: and are not all within treatment parameters. Per Dr. Julien Nordmann , it is ok to treat pt today with Carbo/VP-16 and creatinine =1.61  Per physician team, patient is ready for treatment and there are NO modifications to the treatment plan.

## 2022-04-06 NOTE — Patient Instructions (Signed)
[facility]  Discharge Instructions: Thank you for choosing [facility] to provide your oncology and hematology care.   If you have a lab appointment with the Cancer Center, please go directly to the Cancer Center and check in at the registration area.   Wear comfortable clothing and clothing appropriate for easy access to any Portacath or PICC line.   We strive to give you quality time with your provider. You may need to reschedule your appointment if you arrive late (15 or more minutes).  Arriving late affects you and other patients whose appointments are after yours.  Also, if you miss three or more appointments without notifying the office, you may be dismissed from the clinic at the provider's discretion.      For prescription refill requests, have your pharmacy contact our office and allow 72 hours for refills to be completed.    Today you received the following chemotherapy and/or immunotherapy agents Keytruda      To help prevent nausea and vomiting after your treatment, we encourage you to take your nausea medication as directed.  BELOW ARE SYMPTOMS THAT SHOULD BE REPORTED IMMEDIATELY: *FEVER GREATER THAN 100.4 F (38 C) OR HIGHER *CHILLS OR SWEATING *NAUSEA AND VOMITING THAT IS NOT CONTROLLED WITH YOUR NAUSEA MEDICATION *UNUSUAL SHORTNESS OF BREATH *UNUSUAL BRUISING OR BLEEDING *URINARY PROBLEMS (pain or burning when urinating, or frequent urination) *BOWEL PROBLEMS (unusual diarrhea, constipation, pain near the anus) TENDERNESS IN MOUTH AND THROAT WITH OR WITHOUT PRESENCE OF ULCERS (sore throat, sores in mouth, or a toothache) UNUSUAL RASH, SWELLING OR PAIN  UNUSUAL VAGINAL DISCHARGE OR ITCHING   Items with * indicate a potential emergency and should be followed up as soon as possible or go to the Emergency Department if any problems should occur.  Please show the CHEMOTHERAPY ALERT CARD or IMMUNOTHERAPY ALERT CARD at  check-in to the Emergency Department and triage nurse.  Should you have questions after your visit or need to cancel or reschedule your appointment, please contact [facility]  Dept: [phone]  and follow the prompts.  Office hours are 8:00 a.m. to 4:30 p.m. Monday - Friday. Please note that voicemails left after 4:00 p.m. may not be returned until the following business day.  We are closed weekends and major holidays. You have access to a nurse at all times for urgent questions. Please call the main number to the clinic Dept: [phone] and follow the prompts.   For any non-urgent questions, you may also contact your provider using MyChart. We now offer e-Visits for anyone 18 and older to request care online for non-urgent symptoms. For details visit [email or web address].   Also download the MyChart app! Go to the app store, search "MyChart", open the app, select [facility], and log in with your MyChart username and password.   

## 2022-04-14 ENCOUNTER — Other Ambulatory Visit: Payer: Self-pay | Admitting: Cardiology

## 2022-04-14 DIAGNOSIS — I10 Essential (primary) hypertension: Secondary | ICD-10-CM

## 2022-04-15 ENCOUNTER — Other Ambulatory Visit: Payer: Self-pay

## 2022-04-15 DIAGNOSIS — I10 Essential (primary) hypertension: Secondary | ICD-10-CM

## 2022-04-15 MED ORDER — AMLODIPINE BESYLATE 10 MG PO TABS: 10.0000 mg | ORAL_TABLET | Freq: Every day | ORAL | 1 refills | Status: DC

## 2022-04-16 DIAGNOSIS — E785 Hyperlipidemia, unspecified: Secondary | ICD-10-CM | POA: Diagnosis not present

## 2022-04-16 DIAGNOSIS — Z0000 Encounter for general adult medical examination without abnormal findings: Secondary | ICD-10-CM | POA: Diagnosis not present

## 2022-04-16 DIAGNOSIS — E559 Vitamin D deficiency, unspecified: Secondary | ICD-10-CM | POA: Diagnosis not present

## 2022-04-16 DIAGNOSIS — E119 Type 2 diabetes mellitus without complications: Secondary | ICD-10-CM | POA: Diagnosis not present

## 2022-04-22 ENCOUNTER — Ambulatory Visit
Admission: RE | Admit: 2022-04-22 | Discharge: 2022-04-22 | Disposition: A | Discharge status: Home / Self Care | Payer: Medicare Other | Source: Ambulatory Visit | Attending: Internal Medicine | Admitting: Internal Medicine

## 2022-04-22 DIAGNOSIS — C7931 Secondary malignant neoplasm of brain: Secondary | ICD-10-CM | POA: Diagnosis not present

## 2022-04-22 MED ORDER — GADOPICLENOL 0.5 MMOL/ML IV SOLN
7.5000 mL | Freq: Once | INTRAVENOUS | Status: AC | PRN
  Administered 2022-04-22: 7.5 mL via INTRAVENOUS

## 2022-04-23 DIAGNOSIS — I10 Essential (primary) hypertension: Secondary | ICD-10-CM | POA: Diagnosis not present

## 2022-04-23 DIAGNOSIS — D72829 Elevated white blood cell count, unspecified: Secondary | ICD-10-CM | POA: Diagnosis not present

## 2022-04-23 DIAGNOSIS — E559 Vitamin D deficiency, unspecified: Secondary | ICD-10-CM | POA: Diagnosis not present

## 2022-04-23 DIAGNOSIS — I2510 Atherosclerotic heart disease of native coronary artery without angina pectoris: Secondary | ICD-10-CM | POA: Diagnosis not present

## 2022-04-23 DIAGNOSIS — Z0000 Encounter for general adult medical examination without abnormal findings: Secondary | ICD-10-CM | POA: Diagnosis not present

## 2022-04-23 DIAGNOSIS — E1122 Type 2 diabetes mellitus with diabetic chronic kidney disease: Secondary | ICD-10-CM | POA: Diagnosis not present

## 2022-04-23 DIAGNOSIS — C3492 Malignant neoplasm of unspecified part of left bronchus or lung: Secondary | ICD-10-CM | POA: Diagnosis not present

## 2022-04-23 DIAGNOSIS — N1831 Chronic kidney disease, stage 3a: Secondary | ICD-10-CM | POA: Diagnosis not present

## 2022-04-23 DIAGNOSIS — C7931 Secondary malignant neoplasm of brain: Secondary | ICD-10-CM | POA: Diagnosis not present

## 2022-04-23 DIAGNOSIS — E7800 Pure hypercholesterolemia, unspecified: Secondary | ICD-10-CM | POA: Diagnosis not present

## 2022-04-26 ENCOUNTER — Telehealth: Payer: Self-pay

## 2022-04-26 ENCOUNTER — Inpatient Hospital Stay: Payer: Medicare Other

## 2022-04-26 ENCOUNTER — Other Ambulatory Visit: Payer: Self-pay | Admitting: Physician Assistant

## 2022-04-26 ENCOUNTER — Inpatient Hospital Stay: Payer: Medicare Other | Attending: Physician Assistant | Admitting: Internal Medicine

## 2022-04-26 VITALS — BP 144/67 | HR 66 | Temp 97.9°F | Resp 20 | Wt 191.4 lb

## 2022-04-26 DIAGNOSIS — Z79899 Other long term (current) drug therapy: Secondary | ICD-10-CM | POA: Insufficient documentation

## 2022-04-26 DIAGNOSIS — Z5112 Encounter for antineoplastic immunotherapy: Secondary | ICD-10-CM | POA: Diagnosis not present

## 2022-04-26 DIAGNOSIS — Z87891 Personal history of nicotine dependence: Secondary | ICD-10-CM | POA: Insufficient documentation

## 2022-04-26 DIAGNOSIS — C3412 Malignant neoplasm of upper lobe, left bronchus or lung: Secondary | ICD-10-CM | POA: Diagnosis not present

## 2022-04-26 DIAGNOSIS — C7931 Secondary malignant neoplasm of brain: Secondary | ICD-10-CM | POA: Diagnosis not present

## 2022-04-26 DIAGNOSIS — Z803 Family history of malignant neoplasm of breast: Secondary | ICD-10-CM | POA: Diagnosis not present

## 2022-04-26 DIAGNOSIS — E1169 Type 2 diabetes mellitus with other specified complication: Secondary | ICD-10-CM

## 2022-04-26 NOTE — Progress Notes (Signed)
[facility] [place], [place] [address] [phone]   Interval Evaluation  Date of Service: 04/26/22 Patient Name: Carolyn [name] Patient MRN: [id number] Patient DOB: [birth year] Provider: Ventura Sellers, MD  Identifying Statement:  Carolyn [name] is a 67 y.o. female with Malignant neoplasm metastatic to brain [facility]) [C79.31]   Primary Cancer:  Oncologic History: Oncology History  Adenocarcinoma, lung ([place])  09/12/2018 Initial Diagnosis   Adenocarcinoma, lung ([place])   10/05/2018 -  Chemotherapy   Patient is on Treatment Plan : LUNG Carboplatin (5) + Pemetrexed (500) + Pembrolizumab (200) D1 q21d Induction x 4 cycles / Maintenance Pemetrexed (500) + Pembrolizumab (200) D1 q21d     05/14/2020 Cancer Staging   Staging form: Lung, AJCC 8th Edition - Clinical: Stage IVB (cT1c, cN1, cM1c) - Signed by Curt Bears, MD on 05/14/2020    CNS Oncologic History: 09/22/18: Completes SRS to 9 sub-cm metastases Isidore Moos) 09/02/20: SRS x1 Isidore Moos) 12/15/20: SRS R frontal (SS)  Interval History:  Carolyn [name] presents today for follow up after recent MRI brain.  She has dosed the decadron as instructed without complication.  She denies new or progressive neurologic deficits. No severe headaches reported. Remains phsyically and mentally active. Continues with Keytruda through Dr. Julien Nordmann.  Medications: Current Outpatient Medications on File Prior to Visit  Medication Sig Dispense Refill   acetaminophen (TYLENOL) 500 MG tablet Take 1,000 mg by mouth every 8 (eight) hours as needed for mild pain, fever or headache.      amLODipine (NORVASC) 10 MG tablet Take 1 tablet (10 mg total) by mouth daily. 90 tablet 1   aspirin EC 81 MG tablet Take 81 mg by mouth daily.     carvedilol (COREG) 25 MG tablet Take 1/2 (one-half) tablet by mouth twice daily 180 tablet 2   Cholecalciferol (VITAMIN D-3) 125 MCG (5000 UT) TABS Take 5,000 Units by  mouth daily.     dexamethasone (DECADRON) 4 MG tablet Take 1 tablet (4 mg total) by mouth daily. (Patient not taking: Reported on 04/06/2022) 10 tablet 0   empagliflozin (JARDIANCE) 25 MG TABS tablet Take 1 tablet (25 mg total) by mouth daily before breakfast. 90 tablet 3   esomeprazole (NEXIUM) 20 MG capsule Take 20 mg by mouth daily with breakfast.     ezetimibe (ZETIA) 10 MG tablet TAKE 1 TABLET BY MOUTH ONCE DAILY AFTER SUPPER 90 tablet 0   fexofenadine (ALLEGRA) 180 MG tablet Take 180 mg by mouth daily.     fluticasone (FLONASE) 50 MCG/ACT nasal spray Place 1 spray into both nostrils daily as needed for allergies or rhinitis.     hydroxychloroquine (PLAQUENIL) 200 MG tablet Take 200 mg by mouth daily.     Lancets (ONETOUCH DELICA PLUS [id number]) MISC SMARTSIG:1 Topical 3 Times Daily     lidocaine-prilocaine (EMLA) cream Apply to the Port-A-Cath site 30 to 60 minutes before chemotherapy. 30 g 2   Melatonin 1 MG CAPS Take 2 mg by mouth at bedtime as needed (for insomnia).     metFORMIN (GLUCOPHAGE) 500 MG tablet Take 500 mg by mouth daily with supper.     methocarbamol (ROBAXIN) 500 MG tablet Take 1 tablet (500 mg total) by mouth every 8 (eight) hours as needed for muscle spasms. (Patient not taking: Reported on 04/06/2022) 30 tablet 0   montelukast (SINGULAIR) 10 MG tablet Take 10 mg by mouth daily as needed (allergies).     nitroGLYCERIN (NITROSTAT) 0.4  MG SL tablet Place 1 tablet (0.4 mg total) under the tongue every 5 (five) minutes as needed for chest pain. (Patient not taking: Reported on 04/06/2022) 25 tablet 2   ONETOUCH ULTRA test strip USE 1 STRIP TO CHECK GLUCOSE THREE TIMES DAILY     Polyethyl Glycol-Propyl Glycol (SYSTANE) 0.4-0.3 % GEL ophthalmic gel Place 1 application. into both eyes every 6 (six) hours as needed (dry eyes).     telmisartan (MICARDIS) 40 MG tablet Take 40 mg by mouth daily.     No current facility-administered medications on file prior to visit.    Allergies:   Allergies  Allergen Reactions   Atorvastatin Other (See Comments)    Severe myalgia   Crestor [Rosuvastatin] Other (See Comments)    Severe myalgias   Lisinopril Swelling    Lips and face swelled up.   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   Hypertension    nscl ca dx'd 08/07/18   Persistent headaches    h/o   Pneumonia    hx of PNA   Shortness of breath    Vaginal dryness    h/o   Past Surgical History:  Past Surgical History:  Procedure Laterality Date   ABDOMINAL HYSTERECTOMY     bone spur     CARDIAC CATHETERIZATION  06/13/2012   carpel tunnel surgery Left    COLONOSCOPY     9 years ago   CORONARY ANGIOPLASTY  06/13/2012   EYE SURGERY     cyst left eye    IR IMAGING GUIDED PORT INSERTION  10/10/2018   LEFT HEART CATHETERIZATION WITH CORONARY ANGIOGRAM N/A 06/13/2012   Procedure: LEFT HEART CATHETERIZATION WITH CORONARY ANGIOGRAM;  Surgeon: Laverda Page, MD;  Location: [facility] CATH LAB;  Service: Cardiovascular;  Laterality: N/A;   NECK SURGERY     rotator cuff surgery Right 2015   SHOULDER ARTHROSCOPY WITH ROTATOR CUFF REPAIR AND SUBACROMIAL DECOMPRESSION Left 05/14/2021   Procedure: LEFT SHOULDER ARTHROSCOPY, SUBACROMIAL DECOMPRESSION, BICEPS TENODESIS, MINI OPEN ROTATOR CUFF REPAIR;  Surgeon: Meredith Pel, MD;  Location: [facility];  Service: Orthopedics;  Laterality: Left;   VIDEO BRONCHOSCOPY WITH ENDOBRONCHIAL NAVIGATION N/A  09/06/2018   Procedure: VIDEO BRONCHOSCOPY WITH ENDOBRONCHIAL NAVIGATION, left upper lung;  Surgeon: Lajuana Matte, MD;  Location: [facility] OR;  Service: Thoracic;  Laterality: N/A;   VIDEO BRONCHOSCOPY WITH ENDOBRONCHIAL ULTRASOUND Left 09/06/2018   Procedure: VIDEO BRONCHOSCOPY WITH ENDOBRONCHIAL ULTRASOUND, left lung;  Surgeon: Lajuana Matte, MD;  Location: [facility] OR;  Service: Thoracic;  Laterality: Left;   WISDOM TOOTH EXTRACTION     Social History:  Social History   Socioeconomic History   Marital status: Married    Spouse name: Not on file   Number of children: 3   Years of education: Not on file  Highest education level: Not on file  Occupational History   Not on file  Tobacco Use   Smoking status: Former    Packs/day: 0.50    Years: 30.00    Additional pack years: 0.00    Total pack years: 15.00    Types: Cigarettes    Quit date: 02/09/1999    Years since quitting: 23.2   Smokeless tobacco: Never  Vaping Use   Vaping Use: Never used  Substance and Sexual Activity   Alcohol use: Not Currently   Drug use: Not Currently   Sexual activity: Yes    Birth control/protection: Post-menopausal  Other Topics Concern   Not on file  Social History Narrative   Not on file   Social Determinants of Health   Financial Resource Strain: Not on file  Food Insecurity: Not on file  Transportation Needs: No Transportation Needs (08/29/2018)   PRAPARE - Hydrologist (Medical): No    Lack of Transportation (Non-Medical): No  Physical Activity: Not on file  Stress: Not on file  Social Connections: Not on file  Intimate Partner Violence: Not At Risk (08/29/2018)   Humiliation, Afraid, Rape, and Kick questionnaire    Fear of Current or Ex-Partner: No    Emotionally Abused: No    Physically Abused: No    Sexually Abused: No   Family History:  Family History  Problem Relation Age of Onset   Breast cancer Other    Diabetes Mother    Glaucoma Mother     Hypertension Mother    Hypertension Father    Asthma Father    Diabetes Sister    Diabetes Brother    Hypertension Sister     Review of Systems: Constitutional: Doesn't report fevers, chills or abnormal weight loss Eyes: Doesn't report blurriness of vision Ears, nose, mouth, throat, and face: Doesn't report sore throat Respiratory: Doesn't report cough, dyspnea or wheezes Cardiovascular: Doesn't report palpitation, chest discomfort  Gastrointestinal:  Doesn't report nausea, constipation, diarrhea GU: Doesn't report incontinence Skin: Doesn't report skin rashes Neurological: Per HPI Musculoskeletal: Doesn't report joint pain Behavioral/Psych: Doesn't report anxiety  Physical Exam: There were no vitals filed for this visit.  KPS: 90. General: Alert, cooperative, pleasant, in no acute distress Head: Normal EENT: No conjunctival injection or scleral icterus.  Lungs: Resp effort normal Cardiac: Regular rate Abdomen: Non-distended abdomen Skin: No rashes cyanosis or petechiae. Extremities: No clubbing or edema  Neurologic Exam: Mental Status: Awake, alert, attentive to examiner. Oriented to self and environment. Language is fluent with intact comprehension.  Cranial Nerves: Visual acuity is grossly normal. Visual fields are full. Extra-ocular movements intact. No ptosis. Face is symmetric Motor: Tone and bulk are normal. Power is full in both arms and legs. Reflexes are symmetric, no pathologic reflexes present.  Sensory: Intact to light touch Gait: Normal.   Labs: I have reviewed the data as listed    Component Value Date/Time   NA 141 04/06/2022 0836   NA 139 08/18/2020 0801   K 4.2 04/06/2022 0836   CL 107 04/06/2022 0836   CO2 28 04/06/2022 0836   GLUCOSE 102 (H) 04/06/2022 0836   BUN 21 04/06/2022 0836   BUN 16 08/18/2020 0801   CREATININE 1.61 (H) 04/06/2022 0836   CALCIUM 8.6 (L) 04/06/2022 0836   PROT 6.9 04/06/2022 0836   ALBUMIN 3.9 04/06/2022 0836    AST 16 04/06/2022 0836   ALT 11 04/06/2022 0836   ALKPHOS 84 04/06/2022 0836  BILITOT 0.3 04/06/2022 0836   GFRNONAA 35 (L) 04/06/2022 0836   GFRAA 37 (L) 11/12/2019 0917   GFRAA 37 (L) 11/08/2019 0900   Lab Results  Component Value Date   WBC 6.4 04/06/2022   NEUTROABS 4.0 04/06/2022   HGB 12.1 04/06/2022   HCT 39.2 04/06/2022   MCV 79.7 (L) 04/06/2022   PLT 228 04/06/2022    Imaging:  MR BRAIN W WO CONTRAST  Result Date: 04/26/2022 CLINICAL DATA:  Brain/CNS neoplasm. Assess treatment response. Follow-up metastases. EXAM: MRI HEAD WITHOUT AND WITH CONTRAST TECHNIQUE: Multiplanar, multiecho pulse sequences of the brain and surrounding structures were obtained without and with intravenous contrast. CONTRAST:  7.5 mL Vueway. COMPARISON:  Brain MRI 03/17/2022 and older. FINDINGS: BRAIN New Lesions: None. Larger lesions: None. Stable or Smaller lesions: *20 x 17 mm enhancing lesion located in the right superior frontal gyrus and seen on image 116 series 14 with slightly decreased surrounding vasogenic edema and slightly increased internal blood products, consistent with treatment effect. *Decreased size of the lesion along the atrium of the left lateral ventricle with faint residual enhancement (image 81 series 14). *Unchanged punctate residual enhancement along the left anterior temporal lobe (image 60 series 14). *Near-complete resolution of punctate enhancement in the left inferior vermis (image 33 series 13). Other Brain findings: No acute infarct or hemorrhage. No hydrocephalus or midline shift. Vascular: Normal flow voids. Skull and upper cervical spine: Normal marrow signal. Sinuses/Orbits: Unremarkable. Other: None. IMPRESSION: 1. Stable disease with unchanged size of the dominant enhancing lesion in the right frontal lobe with slightly decreased surrounding vasogenic edema. 2. The 3 smaller lesions are stable to slightly decreased in size. No new lesions. Electronically Signed   By:  Emmit Alexanders M.D.   On: 04/26/2022 09:57     [place] Clinician Interpretation: I have personally reviewed the radiological images as listed.  My interpretation, in the context of the patient's clinical presentation, is stable disease   Assessment/Plan Malignant neoplasm metastatic to brain [facility]) [C79.31]  Carolyn [name] is clinicallly stable today.  MRI brain demonstrates stable findings, following significant progression of R frontal metastasis, previously treated with [facility] November 2022.  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  We ask that Carolyn [name] return to clinic in 4 months following next brain MRI, or sooner as needed.  May treat headaches with PRN Tylenol as prior.  We appreciate the opportunity to participate in the care of Carolyn [name].   All questions were answered. The patient knows to call the clinic with any problems, questions or concerns. No barriers to learning were detected.  I have spent a total of 30 minutes of face-to-face and non-face-to-face time, excluding clinical staff time, preparing to see patient, ordering tests and/or medications, counseling the patient, and independently interpreting results and communicating results to the patient/family/caregiver    Ventura Sellers, MD Medical Director of Neuro-Oncology [facility] [place] 04/26/22 10:27 AM

## 2022-04-26 NOTE — Telephone Encounter (Signed)
This patient spoke with this nurse and asked if a CBC with Diff and a HGBA1C can be drawn with her next lab draw on 3/19 and faxed to M. Adria Dill, [place].  This nurse will forward patient request to the provider.  No further questions or concerns at this time.

## 2022-04-27 ENCOUNTER — Inpatient Hospital Stay: Payer: Medicare Other

## 2022-04-27 ENCOUNTER — Inpatient Hospital Stay (HOSPITAL_BASED_OUTPATIENT_CLINIC_OR_DEPARTMENT_OTHER): Payer: Medicare Other | Admitting: Internal Medicine

## 2022-04-27 ENCOUNTER — Other Ambulatory Visit: Payer: Self-pay

## 2022-04-27 VITALS — BP 135/58 | HR 63 | Temp 97.6°F | Resp 15

## 2022-04-27 DIAGNOSIS — Z5112 Encounter for antineoplastic immunotherapy: Secondary | ICD-10-CM | POA: Diagnosis not present

## 2022-04-27 DIAGNOSIS — C3492 Malignant neoplasm of unspecified part of left bronchus or lung: Secondary | ICD-10-CM

## 2022-04-27 DIAGNOSIS — C3412 Malignant neoplasm of upper lobe, left bronchus or lung: Secondary | ICD-10-CM | POA: Diagnosis not present

## 2022-04-27 DIAGNOSIS — Z95828 Presence of other vascular implants and grafts: Secondary | ICD-10-CM

## 2022-04-27 DIAGNOSIS — C7931 Secondary malignant neoplasm of brain: Secondary | ICD-10-CM | POA: Diagnosis not present

## 2022-04-27 DIAGNOSIS — E1169 Type 2 diabetes mellitus with other specified complication: Secondary | ICD-10-CM

## 2022-04-27 DIAGNOSIS — Z79899 Other long term (current) drug therapy: Secondary | ICD-10-CM | POA: Diagnosis not present

## 2022-04-27 DIAGNOSIS — Z803 Family history of malignant neoplasm of breast: Secondary | ICD-10-CM | POA: Diagnosis not present

## 2022-04-27 DIAGNOSIS — Z87891 Personal history of nicotine dependence: Secondary | ICD-10-CM | POA: Diagnosis not present

## 2022-04-27 LAB — CBC WITH DIFFERENTIAL (CANCER CENTER ONLY)
Abs Immature Granulocytes: 0.03 10*3/uL (ref 0.00–0.07)
Basophils Absolute: 0 10*3/uL (ref 0.0–0.1)
Basophils Relative: 0 %
Eosinophils Absolute: 0.3 10*3/uL (ref 0.0–0.5)
Eosinophils Relative: 4 %
HCT: 39.6 % (ref 36.0–46.0)
Hemoglobin: 12.3 g/dL (ref 12.0–15.0)
Immature Granulocytes: 0 %
Lymphocytes Relative: 14 %
Lymphs Abs: 1.2 10*3/uL (ref 0.7–4.0)
MCH: 24.8 pg — ABNORMAL LOW (ref 26.0–34.0)
MCHC: 31.1 g/dL (ref 30.0–36.0)
MCV: 80 fL (ref 80.0–100.0)
Monocytes Absolute: 0.8 10*3/uL (ref 0.1–1.0)
Monocytes Relative: 9 %
Neutro Abs: 6.1 10*3/uL (ref 1.7–7.7)
Neutrophils Relative %: 73 %
Platelet Count: 180 10*3/uL (ref 150–400)
RBC: 4.95 MIL/uL (ref 3.87–5.11)
RDW: 17.5 % — ABNORMAL HIGH (ref 11.5–15.5)
WBC Count: 8.4 10*3/uL (ref 4.0–10.5)
nRBC: 0 % (ref 0.0–0.2)

## 2022-04-27 LAB — CMP (CANCER CENTER ONLY)
ALT: 14 U/L (ref 0–44)
AST: 15 U/L (ref 15–41)
Albumin: 3.6 g/dL (ref 3.5–5.0)
Alkaline Phosphatase: 72 U/L (ref 38–126)
Anion gap: 7 (ref 5–15)
BUN: 23 mg/dL (ref 8–23)
CO2: 26 mmol/L (ref 22–32)
Calcium: 8.6 mg/dL — ABNORMAL LOW (ref 8.9–10.3)
Chloride: 107 mmol/L (ref 98–111)
Creatinine: 1.83 mg/dL — ABNORMAL HIGH (ref 0.44–1.00)
GFR, Estimated: 30 mL/min — ABNORMAL LOW (ref >60)
Glucose, Bld: 140 mg/dL — ABNORMAL HIGH (ref 70–99)
Potassium: 4 mmol/L (ref 3.5–5.1)
Sodium: 140 mmol/L (ref 135–145)
Total Bilirubin: 0.3 mg/dL (ref 0.3–1.2)
Total Protein: 6.2 g/dL — ABNORMAL LOW (ref 6.5–8.1)

## 2022-04-27 LAB — TSH: TSH: 3.656 u[IU]/mL (ref 0.350–4.500)

## 2022-04-27 MED ORDER — SODIUM CHLORIDE 0.9% FLUSH
10.0000 mL | INTRAVENOUS | Status: DC | PRN
  Administered 2022-04-27: 10 mL

## 2022-04-27 MED ORDER — HEPARIN SOD (PORK) LOCK FLUSH 100 UNIT/ML IV SOLN
500.0000 [IU] | Freq: Once | INTRAVENOUS | Status: AC | PRN
  Administered 2022-04-27: 500 [IU]

## 2022-04-27 MED ORDER — SODIUM CHLORIDE 0.9 % IV SOLN: Freq: Once | INTRAVENOUS | Status: AC

## 2022-04-27 MED ORDER — SODIUM CHLORIDE 0.9 % IV SOLN
200.0000 mg | Freq: Once | INTRAVENOUS | Status: AC
  Administered 2022-04-27: 200 mg via INTRAVENOUS
  Filled 2022-04-27: qty 8

## 2022-04-27 NOTE — Progress Notes (Signed)
[facility] [phone]   [phone]  OFFICE PROGRESS NOTE  [name], [place] [address]  DIAGNOSIS: Stage IV (T1c, N1, M1c ) non-small cell lung cancer, adenocarcinoma diagnosed in July 2020 and presented with left upper lobe lung nodule in addition to right upper lobe lung nodule with left hilar lymphadenopathy as well as multiple brain metastasis.  Biomarker Findings Microsatellite status - MS-Stable Tumor Mutational Burden - 4 Muts/Mb Genomic Findings For a complete list of the genes assayed, please refer to the Appendix. ATM G204* KRAS G12C PDGFRA P122T SMO R199W 7 Disease relevant genes with no reportable alterations: ALK, BRAF, EGFR, ERBB2, MET, RET, ROS1  PDL 1 expression was 1%  PRIOR THERAPY:  1) SRS to multiple brain metastasis under the care of Dr. Isidore Moos. 2) SRS to a 4 mm new brain metastasis under the care of Dr. Isidore Moos and Dr. Saintclair Halsted on 09/02/2020  CURRENT THERAPY: Systemic chemotherapy with carboplatin for AUC of 5, Alimta 500 mg/M2 and Keytruda 200 mg IV every 3 weeks.  First dose October 05, 2018.  Status post 62 cycles.  Starting from cycle #5 the patient will be on treatment with maintenance Alimta 500 mg/M2 and Keytruda 200 mg IV every 3 weeks.  She has been on treatment with single agent Keytruda starting from cycle #9 secondary to renal insufficiency.  INTERVAL HISTORY: Carolyn [name] 67 y.o. female that is physically today for follow-up visit.  The patient is feeling fine today with no concerning complaints.  She denied having any current chest pain, shortness of breath, cough or hemoptysis.  She has no nausea, vomiting, diarrhea or constipation.  She has no headache or visual changes.  She is thought about the duration of immunotherapy with Keytruda and decided to continue with her treatment as planned until she has any evidence of disease progression or unacceptable  toxicity.  She is here for evaluation before starting cycle #63.    MEDICAL HISTORY: Past Medical History:  Diagnosis Date   Anginal pain (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  MEDICATIONS:  Current Outpatient Medications  Medication Sig Dispense Refill   acetaminophen (TYLENOL) 500 MG tablet Take 1,000 mg by mouth every 8 (eight) hours as needed for mild pain, fever or headache.      amLODipine (NORVASC) 10 MG tablet Take 1 tablet (10 mg total) by mouth daily. 90 tablet 1   aspirin EC 81 MG tablet Take 81 mg by mouth daily.     carvedilol (COREG) 25 MG tablet Take 1/2 (one-half) tablet by mouth twice daily 180 tablet 2  Cholecalciferol (VITAMIN D-3) 125 MCG (5000 UT) TABS Take 5,000 Units by mouth daily.     dexamethasone (DECADRON) 4 MG tablet Take 1 tablet (4 mg total) by mouth daily. (Patient not taking: Reported on 04/06/2022) 10 tablet 0   empagliflozin (JARDIANCE) 25 MG TABS tablet Take 1 tablet (25 mg total) by mouth daily before breakfast. 90 tablet 3   esomeprazole (NEXIUM) 20 MG capsule Take 20 mg by mouth daily with breakfast.     ezetimibe (ZETIA) 10 MG tablet TAKE 1 TABLET BY MOUTH ONCE DAILY AFTER SUPPER 90 tablet 0   fexofenadine (ALLEGRA) 180 MG tablet Take 180 mg by mouth daily.     fluticasone (FLONASE) 50 MCG/ACT nasal spray Place  1 spray into both nostrils daily as needed for allergies or rhinitis.     hydroxychloroquine (PLAQUENIL) 200 MG tablet Take 200 mg by mouth daily.     Lancets (ONETOUCH DELICA PLUS 123XX123) MISC SMARTSIG:1 Topical 3 Times Daily     lidocaine-prilocaine (EMLA) cream Apply to the Port-A-Cath site 30 to 60 minutes before chemotherapy. 30 g 2   Melatonin 1 MG CAPS Take 2 mg by mouth at bedtime as needed (for insomnia).     metFORMIN (GLUCOPHAGE) 500 MG tablet Take 500 mg by mouth daily with supper.     methocarbamol (ROBAXIN) 500 MG tablet Take 1 tablet (500 mg total) by mouth every 8 (eight) hours as needed for muscle spasms. (Patient not taking: Reported on 04/06/2022) 30 tablet 0   montelukast (SINGULAIR) 10 MG tablet Take 10 mg by mouth daily as needed (allergies).     nitroGLYCERIN (NITROSTAT) 0.4 MG SL tablet Place 1 tablet (0.4 mg total) under the tongue every 5 (five) minutes as needed for chest pain. (Patient not taking: Reported on 04/06/2022) 25 tablet 2   ONETOUCH ULTRA test strip USE 1 STRIP TO CHECK GLUCOSE THREE TIMES DAILY     Polyethyl Glycol-Propyl Glycol (SYSTANE) 0.4-0.3 % GEL ophthalmic gel Place 1 application. into both eyes every 6 (six) hours as needed (dry eyes).     telmisartan (MICARDIS) 40 MG tablet Take 40 mg by mouth daily.     No current facility-administered medications for this visit.   Facility-Administered Medications Ordered in Other Visits  Medication Dose Route Frequency Provider Last Rate Last Admin   sodium chloride flush (NS) 0.9 % injection 10 mL  10 mL Intracatheter PRN Curt Bears, MD   10 mL at 04/27/22 0747    SURGICAL HISTORY:  Past Surgical History:  Procedure Laterality Date   ABDOMINAL HYSTERECTOMY     bone spur     CARDIAC CATHETERIZATION  06/13/2012   carpel tunnel surgery Left    COLONOSCOPY     9 years ago   CORONARY ANGIOPLASTY  06/13/2012   EYE SURGERY     cyst left eye    IR IMAGING GUIDED PORT INSERTION  10/10/2018   LEFT HEART  CATHETERIZATION WITH CORONARY ANGIOGRAM N/A 06/13/2012   Procedure: LEFT HEART CATHETERIZATION WITH CORONARY ANGIOGRAM;  Surgeon: Laverda Page, MD;  Location[facility] C[facility] LAB;  Service: Cardiovascular;  Laterality: N/A;   NECK SURGERY     rotator cuff surgery Right 2015   SHOULDER ARTHROSCOPY WITH ROTATOR CUFF REPAIR AND SUBACROMIAL DECOMPRESSION Left 05/14/2021   Procedure: LEFT SHOULDER ARTHROSCOPY, SUBACROMIAL DECOMPRESSION, BICEPS TENODESIS, MINI OPEN ROTATOR CUFF REPAIR;  Surgeon: Meredith Pel, MD;  Location: [facility];  Service: Orthopedics;  Laterality: Left;   VIDEO BRONCHOSCOPY WITH ENDOBRONCHIAL NAVIGATION N/A 09/06/2018  Procedure: VIDEO BRONCHOSCOPY WITH ENDOBRONCHIAL NAVIGATION, left upper lung;  Surgeon: Lajuana Matte, MD;  Location: [facility];  Service: Thoracic;  Laterality: N/A;   VIDEO BRONCHOSCOPY WITH ENDOBRONCHIAL ULTRASOUND Left 09/06/2018   Procedure: VIDEO BRONCHOSCOPY WITH ENDOBRONCHIAL ULTRASOUND, left lung;  Surgeon: Lajuana Matte, MD;  Location: [facility] OR;  Service: Thoracic;  Laterality: Left;   WISDOM TOOTH EXTRACTION      REVIEW OF SYSTEMS:  A comprehensive review of systems was negative except for: Constitutional: positive for fatigue   PHYSICAL EXAMINATION: General appearance: alert, cooperative, and no distress Head: Normocephalic, without obvious abnormality, atraumatic Neck: no adenopathy, no JVD, supple, symmetrical, trachea midline, and thyroid not enlarged, symmetric, no tenderness/mass/nodules Lymph nodes: Cervical, supraclavicular, and axillary nodes normal. Resp: clear to auscultation bilaterally Back: symmetric, no curvature. ROM normal. No CVA tenderness. Cardio: regular rate and rhythm, S1, S2 normal, no murmur, click, rub or gallop GI: soft, non-tender; bowel sounds normal; no masses,  no organomegaly Extremities: extremities normal, atraumatic, no cyanosis or edema  ECOG PERFORMANCE STATUS: 1 - Symptomatic but completely ambulatory  Blood  pressure 136/61, pulse 60, temperature 97.9 F (36.6 C), temperature source Oral, resp. rate 16, weight 190 lb 3 oz (86.3 kg), SpO2 100 %.  LABORATORY DATA: Lab Results  Component Value Date   WBC 6.4 04/06/2022   HGB 12.1 04/06/2022   HCT 39.2 04/06/2022   MCV 79.7 (L) 04/06/2022   PLT 228 04/06/2022      Chemistry      Component Value Date/Time   NA 141 04/06/2022 0836   NA 139 08/18/2020 0801   K 4.2 04/06/2022 0836   CL 107 04/06/2022 0836   CO2 28 04/06/2022 0836   BUN 21 04/06/2022 0836   BUN 16 08/18/2020 0801   CREATININE 1.61 (H) 04/06/2022 0836      Component Value Date/Time   CALCIUM 8.6 (L) 04/06/2022 0836   ALKPHOS 84 04/06/2022 0836   AST 16 04/06/2022 0836   ALT 11 04/06/2022 0836   BILITOT 0.3 04/06/2022 0836       RADIOGRAPHIC STUDIES: MR BRAIN W WO CONTRAST  Result Date: 04/26/2022 CLINICAL DATA:  Brain/CNS neoplasm. Assess treatment response. Follow-up metastases. EXAM: MRI HEAD WITHOUT AND WITH CONTRAST TECHNIQUE: Multiplanar, multiecho pulse sequences of the brain and surrounding structures were obtained without and with intravenous contrast. CONTRAST:  7.5 mL Vueway. COMPARISON:  Brain MRI 03/17/2022 and older. FINDINGS: BRAIN New Lesions: None. Larger lesions: None. Stable or Smaller lesions: *20 x 17 mm enhancing lesion located in the right superior frontal gyrus and seen on image 116 series 14 with slightly decreased surrounding vasogenic edema and slightly increased internal blood products, consistent with treatment effect. *Decreased size of the lesion along the atrium of the left lateral ventricle with faint residual enhancement (image 81 series 14). *Unchanged punctate residual enhancement along the left anterior temporal lobe (image 60 series 14). *Near-complete resolution of punctate enhancement in the left inferior vermis (image 33 series 13). Other Brain findings: No acute infarct or hemorrhage. No hydrocephalus or midline shift. Vascular: Normal  flow voids. Skull and upper cervical spine: Normal marrow signal. Sinuses/Orbits: Unremarkable. Other: None. IMPRESSION: 1. Stable disease with unchanged size of the dominant enhancing lesion in the right frontal lobe with slightly decreased surrounding vasogenic edema. 2. The 3 smaller lesions are stable to slightly decreased in size. No new lesions. Electronically Signed   By: Emmit Alexanders M.D.   On: 04/26/2022 09:57     ASSESSMENT AND PLAN:  This is a very pleasant 67 years old African-American female with likely stage IV (T1c, N1, M1C) non-small cell lung cancer, adenocarcinoma with positive KRAS G12C mutation diagnosed in July 2020 and presented with left upper lobe lung nodule in addition to right upper lobe pulmonary nodule as well as left hilar adenopathy and metastatic lesion to the brain. Molecular studies showed no actionable mutation and PDL 1 expression was 1%. The patient underwent SBRT to the metastatic brain lesion under the care of Dr. Isidore Moos. The patient is currently undergoing systemic chemotherapy with carboplatin for AUC of 5, Alimta 500 mg/M2 and Keytruda 200 mg IV every 3 weeks is status post 62 cycles.  Starting from cycle #5 she is on maintenance treatment with Alimta and Keytruda every 3 weeks with only Keytruda on the last few cycles because of the renal insufficiency. The patient has been tolerating this treatment well with no concerning adverse effects. I recommended for her to proceed with cycle #63 today as planned. For the brain metastasis, she had MRI of the brain performed recently that showed no concerning findings for disease progression and she had a stable disease with decreased surrounding vasogenic edema. I will see her back for follow-up visit in 3 weeks for evaluation before starting cycle #64. For the renal insufficiency, she is followed by nephrology. She was advised to call immediately if she has any other concerning symptoms in the interval.  The patient  voices understanding of current disease status and treatment options and is in agreement with the current care plan. All questions were answered. The patient knows to call the clinic with any problems, questions or concerns. We can certainly see the patient much sooner if necessary.  Disclaimer: This note was dictated with voice recognition software. Similar sounding words can inadvertently be transcribed and may not be corrected upon review.

## 2022-04-27 NOTE — Progress Notes (Signed)
Patient seen by MD today  Vitals are within treatment parameters.  Labs reviewed: are not within treatment parameters with creatinine of 1.83. Per Dr. Julien Nordmann , it is ok to treat pt today with Keytruda and creatinine of 1.83.  Per physician team, patient is ready for treatment and there are NO modifications to the treatment plan.

## 2022-04-27 NOTE — Patient Instructions (Signed)
[facility]  Discharge Instructions: Thank you for choosing [facility] to provide your oncology and hematology care.   If you have a lab appointment with the Cancer Center, please go directly to the Cancer Center and check in at the registration area.   Wear comfortable clothing and clothing appropriate for easy access to any Portacath or PICC line.   We strive to give you quality time with your provider. You may need to reschedule your appointment if you arrive late (15 or more minutes).  Arriving late affects you and other patients whose appointments are after yours.  Also, if you miss three or more appointments without notifying the office, you may be dismissed from the clinic at the provider's discretion.      For prescription refill requests, have your pharmacy contact our office and allow 72 hours for refills to be completed.    Today you received the following chemotherapy and/or immunotherapy agents: Keytruda      To help prevent nausea and vomiting after your treatment, we encourage you to take your nausea medication as directed.  BELOW ARE SYMPTOMS THAT SHOULD BE REPORTED IMMEDIATELY: *FEVER GREATER THAN 100.4 F (38 C) OR HIGHER *CHILLS OR SWEATING *NAUSEA AND VOMITING THAT IS NOT CONTROLLED WITH YOUR NAUSEA MEDICATION *UNUSUAL SHORTNESS OF BREATH *UNUSUAL BRUISING OR BLEEDING *URINARY PROBLEMS (pain or burning when urinating, or frequent urination) *BOWEL PROBLEMS (unusual diarrhea, constipation, pain near the anus) TENDERNESS IN MOUTH AND THROAT WITH OR WITHOUT PRESENCE OF ULCERS (sore throat, sores in mouth, or a toothache) UNUSUAL RASH, SWELLING OR PAIN  UNUSUAL VAGINAL DISCHARGE OR ITCHING   Items with * indicate a potential emergency and should be followed up as soon as possible or go to the Emergency Department if any problems should occur.  Please show the CHEMOTHERAPY ALERT CARD or IMMUNOTHERAPY ALERT CARD at  check-in to the Emergency Department and triage nurse.  Should you have questions after your visit or need to cancel or reschedule your appointment, please contact [facility]  Dept: [phone]  and follow the prompts.  Office hours are 8:00 a.m. to 4:30 p.m. Monday - Friday. Please note that voicemails left after 4:00 p.m. may not be returned until the following business day.  We are closed weekends and major holidays. You have access to a nurse at all times for urgent questions. Please call the main number to the clinic Dept: [phone] and follow the prompts.   For any non-urgent questions, you may also contact your provider using MyChart. We now offer e-Visits for anyone 18 and older to request care online for non-urgent symptoms. For details visit [email or web address].   Also download the MyChart app! Go to the app store, search "MyChart", open the app, select [facility], and log in with your MyChart username and password.   

## 2022-04-28 LAB — HEMOGLOBIN A1C
Hgb A1c MFr Bld: 6.9 % — ABNORMAL HIGH (ref 4.8–5.6)
Mean Plasma Glucose: 151 mg/dL

## 2022-04-28 NOTE — Progress Notes (Signed)
Most recent lab results faxed to [facility] attention M. Adria Dill, [place].  Fax Number: [phone].  No further concerns at this time.

## 2022-04-29 ENCOUNTER — Encounter: Payer: Self-pay | Admitting: Internal Medicine

## 2022-05-10 DIAGNOSIS — C3492 Malignant neoplasm of unspecified part of left bronchus or lung: Secondary | ICD-10-CM | POA: Diagnosis not present

## 2022-05-10 DIAGNOSIS — M359 Systemic involvement of connective tissue, unspecified: Secondary | ICD-10-CM | POA: Diagnosis not present

## 2022-05-10 DIAGNOSIS — M79642 Pain in left hand: Secondary | ICD-10-CM | POA: Diagnosis not present

## 2022-05-10 DIAGNOSIS — M1990 Unspecified osteoarthritis, unspecified site: Secondary | ICD-10-CM | POA: Diagnosis not present

## 2022-05-10 DIAGNOSIS — Z79899 Other long term (current) drug therapy: Secondary | ICD-10-CM | POA: Diagnosis not present

## 2022-05-10 DIAGNOSIS — N1831 Chronic kidney disease, stage 3a: Secondary | ICD-10-CM | POA: Diagnosis not present

## 2022-05-10 DIAGNOSIS — R768 Other specified abnormal immunological findings in serum: Secondary | ICD-10-CM | POA: Diagnosis not present

## 2022-05-10 DIAGNOSIS — M7989 Other specified soft tissue disorders: Secondary | ICD-10-CM | POA: Diagnosis not present

## 2022-05-18 ENCOUNTER — Other Ambulatory Visit: Payer: Self-pay

## 2022-05-18 ENCOUNTER — Encounter: Payer: Self-pay | Admitting: Internal Medicine

## 2022-05-18 ENCOUNTER — Inpatient Hospital Stay: Payer: Medicare Other | Attending: Physician Assistant

## 2022-05-18 ENCOUNTER — Encounter: Payer: Self-pay | Admitting: Medical Oncology

## 2022-05-18 ENCOUNTER — Inpatient Hospital Stay (HOSPITAL_BASED_OUTPATIENT_CLINIC_OR_DEPARTMENT_OTHER): Payer: Medicare Other | Admitting: Internal Medicine

## 2022-05-18 ENCOUNTER — Inpatient Hospital Stay: Payer: Medicare Other

## 2022-05-18 VITALS — BP 106/50 | HR 62 | Resp 17

## 2022-05-18 DIAGNOSIS — C3412 Malignant neoplasm of upper lobe, left bronchus or lung: Secondary | ICD-10-CM | POA: Diagnosis not present

## 2022-05-18 DIAGNOSIS — Z79899 Other long term (current) drug therapy: Secondary | ICD-10-CM | POA: Diagnosis not present

## 2022-05-18 DIAGNOSIS — C7931 Secondary malignant neoplasm of brain: Secondary | ICD-10-CM | POA: Insufficient documentation

## 2022-05-18 DIAGNOSIS — Z5112 Encounter for antineoplastic immunotherapy: Secondary | ICD-10-CM | POA: Diagnosis not present

## 2022-05-18 DIAGNOSIS — C3492 Malignant neoplasm of unspecified part of left bronchus or lung: Secondary | ICD-10-CM | POA: Diagnosis not present

## 2022-05-18 DIAGNOSIS — Z95828 Presence of other vascular implants and grafts: Secondary | ICD-10-CM

## 2022-05-18 LAB — CMP (CANCER CENTER ONLY)
ALT: 10 U/L (ref 0–44)
AST: 15 U/L (ref 15–41)
Albumin: 3.8 g/dL (ref 3.5–5.0)
Alkaline Phosphatase: 87 U/L (ref 38–126)
Anion gap: 9 (ref 5–15)
BUN: 16 mg/dL (ref 8–23)
CO2: 25 mmol/L (ref 22–32)
Calcium: 9.3 mg/dL (ref 8.9–10.3)
Chloride: 107 mmol/L (ref 98–111)
Creatinine: 1.76 mg/dL — ABNORMAL HIGH (ref 0.44–1.00)
GFR, Estimated: 31 mL/min — ABNORMAL LOW (ref >60)
Glucose, Bld: 149 mg/dL — ABNORMAL HIGH (ref 70–99)
Potassium: 3.7 mmol/L (ref 3.5–5.1)
Sodium: 141 mmol/L (ref 135–145)
Total Bilirubin: 0.3 mg/dL (ref 0.3–1.2)
Total Protein: 6.6 g/dL (ref 6.5–8.1)

## 2022-05-18 LAB — CBC WITH DIFFERENTIAL (CANCER CENTER ONLY)
Abs Immature Granulocytes: 0.02 10*3/uL (ref 0.00–0.07)
Basophils Absolute: 0 10*3/uL (ref 0.0–0.1)
Basophils Relative: 1 %
Eosinophils Absolute: 0.2 10*3/uL (ref 0.0–0.5)
Eosinophils Relative: 4 %
HCT: 39.8 % (ref 36.0–46.0)
Hemoglobin: 12.1 g/dL (ref 12.0–15.0)
Immature Granulocytes: 0 %
Lymphocytes Relative: 21 %
Lymphs Abs: 1.2 10*3/uL (ref 0.7–4.0)
MCH: 24.6 pg — ABNORMAL LOW (ref 26.0–34.0)
MCHC: 30.4 g/dL (ref 30.0–36.0)
MCV: 81.1 fL (ref 80.0–100.0)
Monocytes Absolute: 0.5 10*3/uL (ref 0.1–1.0)
Monocytes Relative: 10 %
Neutro Abs: 3.5 10*3/uL (ref 1.7–7.7)
Neutrophils Relative %: 64 %
Platelet Count: 290 10*3/uL (ref 150–400)
RBC: 4.91 MIL/uL (ref 3.87–5.11)
RDW: 16.6 % — ABNORMAL HIGH (ref 11.5–15.5)
WBC Count: 5.5 10*3/uL (ref 4.0–10.5)
nRBC: 0 % (ref 0.0–0.2)

## 2022-05-18 LAB — TSH: TSH: 2.654 u[IU]/mL (ref 0.350–4.500)

## 2022-05-18 MED ORDER — SODIUM CHLORIDE 0.9 % IV SOLN
200.0000 mg | Freq: Once | INTRAVENOUS | Status: AC
  Administered 2022-05-18: 200 mg via INTRAVENOUS
  Filled 2022-05-18: qty 200

## 2022-05-18 MED ORDER — SODIUM CHLORIDE 0.9% FLUSH
10.0000 mL | INTRAVENOUS | Status: DC | PRN
  Administered 2022-05-18: 10 mL

## 2022-05-18 MED ORDER — SODIUM CHLORIDE 0.9 % IV SOLN: Freq: Once | INTRAVENOUS | Status: AC

## 2022-05-18 MED ORDER — HEPARIN SOD (PORK) LOCK FLUSH 100 UNIT/ML IV SOLN
500.0000 [IU] | Freq: Once | INTRAVENOUS | Status: AC | PRN
  Administered 2022-05-18: 500 [IU]

## 2022-05-18 NOTE — Progress Notes (Signed)
[facility] [phone]   [phone]  OFFICE PROGRESS NOTE  Carolyn Sanger, FNP [address] [place] [address]  DIAGNOSIS: Stage IV (T1c, N1, M1c ) non-small cell lung cancer, adenocarcinoma diagnosed in July 2020 and presented with left upper lobe lung nodule in addition to right upper lobe lung nodule with left hilar lymphadenopathy as well as multiple brain metastasis.  Biomarker Findings Microsatellite status - MS-Stable Tumor Mutational Burden - 4 Muts/Mb Genomic Findings For a complete list of the genes assayed, please refer to the Appendix. ATM G204* KRAS G12C PDGFRA P122T SMO R199W 7 Disease relevant genes with no reportable alterations: ALK, BRAF, EGFR, ERBB2, MET, RET, ROS1  PDL 1 expression was 1%  PRIOR THERAPY:  1) SRS to multiple brain metastasis under the care of Dr. Basilio Cairo. 2) SRS to a 4 mm new brain metastasis under the care of Dr. Basilio Cairo and Dr. Wynetta Emery on 09/02/2020  CURRENT THERAPY: Systemic chemotherapy with carboplatin for AUC of 5, Alimta 500 mg/M2 and Keytruda 200 mg IV every 3 weeks.  First dose October 05, 2018.  Status post 63 cycles.  Starting from cycle #5 the patient will be on treatment with maintenance Alimta 500 mg/M2 and Keytruda 200 mg IV every 3 weeks.  She has been on treatment with single agent Keytruda starting from cycle #9 secondary to renal insufficiency.  INTERVAL HISTORY: Carolyn [name] 67 y.o. female returns to the clinic today for follow-up visit.  The patient is feeling fine today with no concerning complaints.  She denied having any current chest pain, shortness of breath except with exertion with no cough or hemoptysis.  She has no nausea, vomiting, diarrhea or constipation.  She has no headache or visual changes.  She denied having any recent weight loss or night sweats.  She is here today for evaluation before starting cycle #64.    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  MEDICATIONS:  Current Outpatient Medications  Medication Sig Dispense Refill   acetaminophen (TYLENOL) 500 MG tablet Take 1,000 mg by mouth every 8 (eight) hours as needed for mild pain, fever or headache.      amLODipine (NORVASC) 10 MG tablet Take 1 tablet (10 mg total) by mouth daily. 90 tablet 1   aspirin EC 81 MG tablet Take 81 mg by mouth daily.     carvedilol (COREG) 25 MG tablet Take 1/2 (one-half) tablet by mouth twice daily 180 tablet 2   Cholecalciferol (VITAMIN D-3) 125 MCG (5000 UT) TABS Take 5,000 Units by  mouth daily.     dexamethasone (DECADRON) 4 MG tablet Take 1 tablet (4 mg total) by mouth daily. (Patient not taking: Reported on 04/06/2022) 10 tablet 0   empagliflozin (JARDIANCE) 25 MG TABS tablet Take 1 tablet (25 mg total) by mouth daily before breakfast. 90 tablet 3   esomeprazole (NEXIUM) 20 MG capsule Take 20 mg by mouth daily with breakfast.     ezetimibe (ZETIA) 10 MG tablet TAKE 1 TABLET BY MOUTH ONCE DAILY AFTER SUPPER 90 tablet 0   fexofenadine (ALLEGRA) 180 MG tablet Take 180 mg by mouth daily.     fluticasone (FLONASE) 50 MCG/ACT nasal spray Place 1 spray into both nostrils daily as needed for allergies or rhinitis.     hydroxychloroquine  (PLAQUENIL) 200 MG tablet Take 200 mg by mouth daily.     Lancets (ONETOUCH DELICA PLUS LANCET33G) MISC SMARTSIG:1 Topical 3 Times Daily     lidocaine-prilocaine (EMLA) cream Apply to the Port-A-Cath site 30 to 60 minutes before chemotherapy. 30 g 2   Melatonin 1 MG CAPS Take 2 mg by mouth at bedtime as needed (for insomnia).     metFORMIN (GLUCOPHAGE) 500 MG tablet Take 500 mg by mouth daily with supper.     methocarbamol (ROBAXIN) 500 MG tablet Take 1 tablet (500 mg total) by mouth every 8 (eight) hours as needed for muscle spasms. (Patient not taking: Reported on 04/06/2022) 30 tablet 0   montelukast (SINGULAIR) 10 MG tablet Take 10 mg by mouth daily as needed (allergies).     nitroGLYCERIN (NITROSTAT) 0.4 MG SL tablet Place 1 tablet (0.4 mg total) under the tongue every 5 (five) minutes as needed for chest pain. (Patient not taking: Reported on 04/06/2022) 25 tablet 2   ONETOUCH ULTRA test strip USE 1 STRIP TO CHECK GLUCOSE THREE TIMES DAILY     Polyethyl Glycol-Propyl Glycol (SYSTANE) 0.4-0.3 % GEL ophthalmic gel Place 1 application. into both eyes every 6 (six) hours as needed (dry eyes).     telmisartan (MICARDIS) 40 MG tablet Take 40 mg by mouth daily.     No current facility-administered medications for this visit.    SURGICAL HISTORY:  Past Surgical History:  Procedure Laterality Date   ABDOMINAL HYSTERECTOMY     bone spur     CARDIAC CATHETERIZATION  06/13/2012   carpel tunnel surgery Left    COLONOSCOPY     9 years ago   CORONARY ANGIOPLASTY  06/13/2012   EYE SURGERY     cyst left eye    IR IMAGING GUIDED PORT INSERTION  10/10/2018   LEFT HEART CATHETERIZATION WITH CORONARY ANGIOGRAM N/A 06/13/2012   Procedure: LEFT HEART CATHETERIZATION WITH CORONARY ANGIOGRAM;  Surgeon: Pamella Pert, MD;  Location: [facility] CATH LAB;  Service: Cardiovascular;  Laterality: N/A;   NECK SURGERY     rotator cuff surgery Right 2015   SHOULDER ARTHROSCOPY WITH ROTATOR CUFF REPAIR AND SUBACROMIAL  DECOMPRESSION Left 05/14/2021   Procedure: LEFT SHOULDER ARTHROSCOPY, SUBACROMIAL DECOMPRESSION, BICEPS TENODESIS, MINI OPEN ROTATOR CUFF REPAIR;  Surgeon: Cammy Copa, MD;  Location: [facility] OR;  Service: Orthopedics;  Laterality: Left;   VIDEO BRONCHOSCOPY WITH ENDOBRONCHIAL NAVIGATION N/A 09/06/2018   Procedure: VIDEO BRONCHOSCOPY WITH ENDOBRONCHIAL NAVIGATION, left upper lung;  Surgeon: Corliss Skains, MD;  Location: [facility] OR;  Service: Thoracic;  Laterality: N/A;   VIDEO BRONCHOSCOPY WITH ENDOBRONCHIAL ULTRASOUND Left 09/06/2018   Procedure: VIDEO BRONCHOSCOPY WITH ENDOBRONCHIAL ULTRASOUND, left lung;  Surgeon: Corliss Skains, MD;  Location: [facility] OR;  Service: Thoracic;  Laterality: Left;   WISDOM TOOTH EXTRACTION      REVIEW OF SYSTEMS:  A comprehensive review of systems was negative except for: Respiratory: positive for dyspnea on exertion   PHYSICAL EXAMINATION: General appearance: alert, cooperative, and no distress Head: Normocephalic, without obvious abnormality, atraumatic Neck: no adenopathy, no JVD, supple, symmetrical, trachea midline, and thyroid not enlarged, symmetric, no tenderness/mass/nodules Lymph nodes: Cervical, supraclavicular, and axillary nodes normal. Resp: clear to auscultation bilaterally Back: symmetric, no curvature. ROM normal. No CVA tenderness. Cardio: regular rate and rhythm, S1, S2 normal, no murmur, click, rub or gallop GI: soft, non-tender; bowel sounds normal; no masses,  no organomegaly Extremities: extremities normal, atraumatic, no cyanosis or edema  ECOG PERFORMANCE STATUS: 1 - Symptomatic but completely ambulatory  Blood pressure (!) 138/59, pulse 64, temperature 97.9 F (36.6 C), temperature source Temporal, resp. rate 18, height 5\' 1"  (1.549 m), weight 190 lb 9.6 oz (86.5 kg), SpO2 100 %.  LABORATORY DATA: Lab Results  Component Value Date   WBC 5.5 05/18/2022   HGB 12.1 05/18/2022   HCT 39.8 05/18/2022   MCV 81.1 05/18/2022   PLT  290 05/18/2022      Chemistry      Component Value Date/Time   NA 140 04/27/2022 0744   NA 139 08/18/2020 0801   K 4.0 04/27/2022 0744   CL 107 04/27/2022 0744   CO2 26 04/27/2022 0744   BUN 23 04/27/2022 0744   BUN 16 08/18/2020 0801   CREATININE 1.83 (H) 04/27/2022 0744      Component Value Date/Time   CALCIUM 8.6 (L) 04/27/2022 0744   ALKPHOS 72 04/27/2022 0744   AST 15 04/27/2022 0744   ALT 14 04/27/2022 0744   BILITOT 0.3 04/27/2022 0744       RADIOGRAPHIC STUDIES: MR BRAIN W WO CONTRAST  Result Date: 04/26/2022 CLINICAL DATA:  Brain/CNS neoplasm. Assess treatment response. Follow-up metastases. EXAM: MRI HEAD WITHOUT AND WITH CONTRAST TECHNIQUE: Multiplanar, multiecho pulse sequences of the brain and surrounding structures were obtained without and with intravenous contrast. CONTRAST:  7.5 mL Vueway. COMPARISON:  Brain MRI 03/17/2022 and older. FINDINGS: BRAIN New Lesions: None. Larger lesions: None. Stable or Smaller lesions: *20 x 17 mm enhancing lesion located in the right superior frontal gyrus and seen on image 116 series 14 with slightly decreased surrounding vasogenic edema and slightly increased internal blood products, consistent with treatment effect. *Decreased size of the lesion along the atrium of the left lateral ventricle with faint residual enhancement (image 81 series 14). *Unchanged punctate residual enhancement along the left anterior temporal lobe (image 60 series 14). *Near-complete resolution of punctate enhancement in the left inferior vermis (image 33 series 13). Other Brain findings: No acute infarct or hemorrhage. No hydrocephalus or midline shift. Vascular: Normal flow voids. Skull and upper cervical spine: Normal marrow signal. Sinuses/Orbits: Unremarkable. Other: None. IMPRESSION: 1. Stable disease with unchanged size of the dominant enhancing lesion in the right frontal lobe with slightly decreased surrounding vasogenic edema. 2. The 3 smaller lesions  are stable to slightly decreased in size. No new lesions. Electronically Signed   By: Orvan FalconerWalter  Wiggins M.D.   On: 04/26/2022 09:57     ASSESSMENT AND PLAN: This is a very pleasant 67 years old African-American female with likely stage IV (T1c, N1, M1C) non-small cell lung cancer, adenocarcinoma with positive KRAS G12C mutation diagnosed in July 2020 and presented with left upper lobe lung nodule in addition to right upper lobe pulmonary nodule as  well as left hilar adenopathy and metastatic lesion to the brain. Molecular studies showed no actionable mutation and PDL 1 expression was 1%. The patient underwent SBRT to the metastatic brain lesion under the care of Dr. Basilio Cairo. The patient is currently undergoing systemic chemotherapy with carboplatin for AUC of 5, Alimta 500 mg/M2 and Keytruda 200 mg IV every 3 weeks is status post 63 cycles.  Starting from cycle #5 she is on maintenance treatment with Alimta and Keytruda every 3 weeks with only Keytruda on the last few cycles because of the renal insufficiency. She has been tolerating her treatment with Keytruda fairly well with no concerning adverse effects. I discussed with the patient again consideration of stopping treatment and close monitoring but she is scared about discontinuing the treatment.  I assured her that if she decided to stop we will be monitor her closely and consider resuming the treatment again if she has disease progression or switching to oral drug for the positive KRAS G12C mutation.  The patient will think about it a little bit more.  For now she will proceed with cycle #64 today as planned. She will come back for follow-up visit in 3 weeks for evaluation before the next cycle of her treatment. For the renal insufficiency, she is followed by nephrology. The patient was advised to call immediately if she has any other concerning symptoms in the interval.  The patient voices understanding of current disease status and treatment  options and is in agreement with the current care plan. All questions were answered. The patient knows to call the clinic with any problems, questions or concerns. We can certainly see the patient much sooner if necessary.  Disclaimer: This note was dictated with voice recognition software. Similar sounding words can inadvertently be transcribed and may not be corrected upon review.

## 2022-05-18 NOTE — Patient Instructions (Signed)
[facility]  Discharge Instructions: Thank you for choosing [facility] to provide your oncology and hematology care.   If you have a lab appointment with the Cancer Center, please go directly to the Cancer Center and check in at the registration area.   Wear comfortable clothing and clothing appropriate for easy access to any Portacath or PICC line.   We strive to give you quality time with your provider. You may need to reschedule your appointment if you arrive late (15 or more minutes).  Arriving late affects you and other patients whose appointments are after yours.  Also, if you miss three or more appointments without notifying the office, you may be dismissed from the clinic at the provider's discretion.      For prescription refill requests, have your pharmacy contact our office and allow 72 hours for refills to be completed.    Today you received the following chemotherapy and/or immunotherapy agents: Keytruda      To help prevent nausea and vomiting after your treatment, we encourage you to take your nausea medication as directed.  BELOW ARE SYMPTOMS THAT SHOULD BE REPORTED IMMEDIATELY: *FEVER GREATER THAN 100.4 F (38 C) OR HIGHER *CHILLS OR SWEATING *NAUSEA AND VOMITING THAT IS NOT CONTROLLED WITH YOUR NAUSEA MEDICATION *UNUSUAL SHORTNESS OF BREATH *UNUSUAL BRUISING OR BLEEDING *URINARY PROBLEMS (pain or burning when urinating, or frequent urination) *BOWEL PROBLEMS (unusual diarrhea, constipation, pain near the anus) TENDERNESS IN MOUTH AND THROAT WITH OR WITHOUT PRESENCE OF ULCERS (sore throat, sores in mouth, or a toothache) UNUSUAL RASH, SWELLING OR PAIN  UNUSUAL VAGINAL DISCHARGE OR ITCHING   Items with * indicate a potential emergency and should be followed up as soon as possible or go to the Emergency Department if any problems should occur.  Please show the CHEMOTHERAPY ALERT CARD or IMMUNOTHERAPY ALERT CARD at  check-in to the Emergency Department and triage nurse.  Should you have questions after your visit or need to cancel or reschedule your appointment, please contact [facility]  Dept: [phone]  and follow the prompts.  Office hours are 8:00 a.m. to 4:30 p.m. Monday - Friday. Please note that voicemails left after 4:00 p.m. may not be returned until the following business day.  We are closed weekends and major holidays. You have access to a nurse at all times for urgent questions. Please call the main number to the clinic Dept: [phone] and follow the prompts.   For any non-urgent questions, you may also contact your provider using MyChart. We now offer e-Visits for anyone 18 and older to request care online for non-urgent symptoms. For details visit [email or web address].   Also download the MyChart app! Go to the app store, search "MyChart", open the app, select [facility], and log in with your MyChart username and password.   

## 2022-05-18 NOTE — Progress Notes (Signed)
Patient seen by Dr. Gypsy Balsam are within treatment parameters.  Labs reviewed: and are not all within treatment parameters. Per Dr. Arbutus Ped ,it is okay top treat pt today with Pembrolizumab and creatinine of 1.76  Per physician team, patient is ready for treatment and there are NO modifications to the treatment plan.

## 2022-05-26 DIAGNOSIS — N6002 Solitary cyst of left breast: Secondary | ICD-10-CM | POA: Diagnosis not present

## 2022-05-26 DIAGNOSIS — B3731 Acute candidiasis of vulva and vagina: Secondary | ICD-10-CM | POA: Diagnosis not present

## 2022-05-26 DIAGNOSIS — N644 Mastodynia: Secondary | ICD-10-CM | POA: Diagnosis not present

## 2022-05-27 ENCOUNTER — Telehealth: Payer: Self-pay | Admitting: Internal Medicine

## 2022-05-27 NOTE — Telephone Encounter (Signed)
Called patient regarding upcoming April and May appointments, patient is notified. 

## 2022-05-31 DIAGNOSIS — E669 Obesity, unspecified: Secondary | ICD-10-CM | POA: Diagnosis not present

## 2022-05-31 DIAGNOSIS — Z6835 Body mass index (BMI) 35.0-35.9, adult: Secondary | ICD-10-CM | POA: Diagnosis not present

## 2022-05-31 DIAGNOSIS — J020 Streptococcal pharyngitis: Secondary | ICD-10-CM | POA: Diagnosis not present

## 2022-05-31 DIAGNOSIS — J029 Acute pharyngitis, unspecified: Secondary | ICD-10-CM | POA: Diagnosis not present

## 2022-06-01 ENCOUNTER — Other Ambulatory Visit: Payer: Self-pay

## 2022-06-01 DIAGNOSIS — E7800 Pure hypercholesterolemia, unspecified: Secondary | ICD-10-CM

## 2022-06-01 MED ORDER — EZETIMIBE 10 MG PO TABS
10.0000 mg | ORAL_TABLET | Freq: Every day | ORAL | 2 refills | Status: DC
Start: 2022-06-01 — End: 2022-11-24

## 2022-06-04 NOTE — Progress Notes (Signed)
[facility] OFFICE PROGRESS NOTE  [name], FNP [address] [place] [address]  DIAGNOSIS: Stage IV (T1c, N1, M1c ) non-small cell lung cancer, adenocarcinoma diagnosed in July 2020 and presented with left upper lobe lung nodule in addition to right upper lobe lung nodule with left hilar lymphadenopathy as well as multiple brain metastasis.   Biomarker Findings Microsatellite status - MS-Stable Tumor Mutational Burden - 4 Muts/Mb Genomic Findings For a complete list of the genes assayed, please refer to the Appendix. ATM G204* KRAS G12C PDGFRA P122T SMO R199W 7 Disease relevant genes with no reportable alterations: ALK, BRAF, EGFR, ERBB2, MET, RET, ROS1   PDL 1 expression was 1%  PRIOR THERAPY: 1) SBRT to multiple brain metastasis under the care of Dr. Basilio Cairo. Completed in August 2020 2) SRS to the new metastatic brain lesions under the care of Dr. Wynetta Emery and Dr. Basilio Cairo on 09/02/20 and 12/16/20  CURRENT THERAPY: Systemic chemotherapy with carboplatin for AUC of 5, Alimta 500 mg/M2 and Keytruda 200 mg IV every 3 weeks.  First dose October 05, 2018.  Status post 64 cycles.  Starting from cycle #5 the patient will be on treatment with maintenance Alimta 500 mg/M2 and Keytruda 200 mg IV every 3 weeks.  She has been on treatment with single agent Keytruda starting from cycle #9 secondary to renal insufficiency.   INTERVAL HISTORY: Carolyn [name] 67 y.o. female returns to the clinic today for a follow-up visit. The patient is feeling fairly well today without any concerning complaints. She is currently on chemotherapy and tolerating it well. She denies fever, chills, night sweats, or weight loss. She denies any chest pain or hemoptysis.  Denies any nausea, vomiting, diarrhea.  She sometimes has occasional constipation for which she takes over-the-counter laxatives.  Denies any rashes or skin changes.  headaches or vision changes ***. Her next brain  MRI is scheduled for 08/19/22. She is here today for evaluation and repeat blood work before starting cycle #65.     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  MEDICATIONS:  Current Outpatient Medications  Medication Sig Dispense Refill   acetaminophen (TYLENOL) 500 MG tablet Take 1,000 mg by mouth every 8 (eight) hours as needed for mild pain, fever or headache.      amLODipine (NORVASC) 10 MG tablet Take 1 tablet (10 mg total) by mouth daily. 90 tablet 1   aspirin EC 81 MG tablet Take 81 mg by mouth daily.     carvedilol (COREG) 25 MG tablet Take 1/2 (one-half) tablet by mouth twice  daily 180 tablet 2   Cholecalciferol (VITAMIN D-3) 125 MCG (5000 UT) TABS Take 5,000 Units by mouth daily.     dexamethasone (DECADRON) 4 MG tablet Take 1 tablet (4 mg total) by mouth daily. (Patient not taking: Reported on 04/06/2022) 10 tablet 0   empagliflozin (JARDIANCE) 25 MG TABS tablet Take 1 tablet (25 mg total) by mouth daily before breakfast. 90 tablet 3   esomeprazole (NEXIUM) 20 MG capsule Take 20 mg by mouth daily with breakfast.     ezetimibe (ZETIA) 10 MG tablet Take 1 tablet (10 mg total) by mouth daily. 90 tablet 2   fexofenadine (ALLEGRA) 180 MG tablet Take 180 mg by mouth daily.      fluticasone (FLONASE) 50 MCG/ACT nasal spray Place 1 spray into both nostrils daily as needed for allergies or rhinitis.     hydroxychloroquine (PLAQUENIL) 200 MG tablet Take 200 mg by mouth daily.     Lancets (ONETOUCH DELICA PLUS LANCET33G) MISC SMARTSIG:1 Topical 3 Times Daily     levocetirizine (XYZAL) 5 MG tablet Take 5 mg by mouth every evening.     lidocaine-prilocaine (EMLA) cream Apply to the Port-A-Cath site 30 to 60 minutes before chemotherapy. 30 g 2   Melatonin 1 MG CAPS Take 2 mg by mouth at bedtime as needed (for insomnia).     metFORMIN (GLUCOPHAGE) 500 MG tablet Take 500 mg by mouth daily with supper.     methocarbamol (ROBAXIN) 500 MG tablet Take 1 tablet (500 mg total) by mouth every 8 (eight) hours as needed for muscle spasms. (Patient not taking: Reported on 04/06/2022) 30 tablet 0   montelukast (SINGULAIR) 10 MG tablet Take 10 mg by mouth daily as needed (allergies).     nitroGLYCERIN (NITROSTAT) 0.4 MG SL tablet Place 1 tablet (0.4 mg total) under the tongue every 5 (five) minutes as needed for chest pain. (Patient not taking: Reported on 04/06/2022) 25 tablet 2   ONETOUCH ULTRA test strip USE 1 STRIP TO CHECK GLUCOSE THREE TIMES DAILY     Polyethyl Glycol-Propyl Glycol (SYSTANE) 0.4-0.3 % GEL ophthalmic gel Place 1 application. into both eyes every 6 (six) hours as needed (dry eyes).     telmisartan (MICARDIS) 40 MG tablet Take 40 mg by mouth daily.     No current facility-administered medications for this visit.    SURGICAL HISTORY:  Past Surgical History:  Procedure Laterality Date   ABDOMINAL HYSTERECTOMY     bone spur     CARDIAC CATHETERIZATION  06/13/2012   carpel tunnel surgery Left    COLONOSCOPY     9 years ago   CORONARY ANGIOPLASTY  06/13/2012   EYE SURGERY     cyst left eye    IR IMAGING GUIDED PORT INSERTION  10/10/2018   LEFT HEART CATHETERIZATION WITH CORONARY ANGIOGRAM N/A 06/13/2012   Procedure: LEFT HEART CATHETERIZATION WITH CORONARY ANGIOGRAM;   Surgeon: Pamella Pert, MD;  Location: [facility] CATH LAB;  Service: Cardiovascular;  Laterality: N/A;   NECK SURGERY     rotator cuff surgery Right 2015   SHOULDER ARTHROSCOPY WITH ROTATOR CUFF REPAIR AND SUBACROMIAL DECOMPRESSION Left 05/14/2021   Procedure: LEFT SHOULDER ARTHROSCOPY, SUBACROMIAL DECOMPRESSION, BICEPS TENODESIS, MINI OPEN ROTATOR CUFF REPAIR;  Surgeon: Cammy Copa, MD;  Location: [facility] OR;  Service: Orthopedics;  Laterality: Left;   VIDEO BRONCHOSCOPY WITH ENDOBRONCHIAL NAVIGATION N/A 09/06/2018   Procedure: VIDEO BRONCHOSCOPY WITH ENDOBRONCHIAL NAVIGATION, left upper lung;  Surgeon: Corliss Skains, MD;  Location: [facility] OR;  Service:  Thoracic;  Laterality: N/A;   VIDEO BRONCHOSCOPY WITH ENDOBRONCHIAL ULTRASOUND Left 09/06/2018   Procedure: VIDEO BRONCHOSCOPY WITH ENDOBRONCHIAL ULTRASOUND, left lung;  Surgeon: Corliss Skains, MD;  Location: [facility] OR;  Service: Thoracic;  Laterality: Left;   WISDOM TOOTH EXTRACTION      REVIEW OF SYSTEMS:   Review of Systems  Constitutional: Negative for appetite change, chills, fatigue, fever and unexpected weight change.  HENT:   Negative for mouth sores, nosebleeds, sore throat and trouble swallowing.   Eyes: Negative for eye problems and icterus.  Respiratory: Negative for cough, hemoptysis, shortness of breath and wheezing.   Cardiovascular: Negative for chest pain and leg swelling.  Gastrointestinal: Negative for abdominal pain, constipation, diarrhea, nausea and vomiting.  Genitourinary: Negative for bladder incontinence, difficulty urinating, dysuria, frequency and hematuria.   Musculoskeletal: Negative for back pain, gait problem, neck pain and neck stiffness.  Skin: Negative for itching and rash.  Neurological: Negative for dizziness, extremity weakness, gait problem, headaches, light-headedness and seizures.  Hematological: Negative for adenopathy. Does not bruise/bleed easily.  Psychiatric/Behavioral: Negative for confusion,  depression and sleep disturbance. The patient is not nervous/anxious.     PHYSICAL EXAMINATION:  There were no vitals taken for this visit.  ECOG PERFORMANCE STATUS: {CHL ONC ECOG [id number]  Physical Exam  Constitutional: Oriented to person, place, and time and well-developed, well-nourished, and in no distress. No distress.  HENT:  Head: Normocephalic and atraumatic.  Mouth/Throat: Oropharynx is clear and moist. No oropharyngeal exudate.  Eyes: Conjunctivae are normal. Right eye exhibits no discharge. Left eye exhibits no discharge. No scleral icterus.  Neck: Normal range of motion. Neck supple.  Cardiovascular: Normal rate, regular rhythm, normal heart sounds and intact distal pulses.   Pulmonary/Chest: Effort normal and breath sounds normal. No respiratory distress. No wheezes. No rales.  Abdominal: Soft. Bowel sounds are normal. Exhibits no distension and no mass. There is no tenderness.  Musculoskeletal: Normal range of motion. Exhibits no edema.  Lymphadenopathy:    No cervical adenopathy.  Neurological: Alert and oriented to person, place, and time. Exhibits normal muscle tone. Gait normal. Coordination normal.  Skin: Skin is warm and dry. No rash noted. Not diaphoretic. No erythema. No pallor.  Psychiatric: Mood, memory and judgment normal.  Vitals reviewed.  LABORATORY DATA: Lab Results  Component Value Date   WBC 5.5 05/18/2022   HGB 12.1 05/18/2022   HCT 39.8 05/18/2022   MCV 81.1 05/18/2022   PLT 290 05/18/2022      Chemistry      Component Value Date/Time   NA 141 05/18/2022 0814   NA 139 08/18/2020 0801   K 3.7 05/18/2022 0814   CL 107 05/18/2022 0814   CO2 25 05/18/2022 0814   BUN 16 05/18/2022 0814   BUN 16 08/18/2020 0801   CREATININE 1.76 (H) 05/18/2022 0814      Component Value Date/Time   CALCIUM 9.3 05/18/2022 0814   ALKPHOS 87 05/18/2022 0814   AST 15 05/18/2022 0814   ALT 10 05/18/2022 0814   BILITOT 0.3 05/18/2022 0814        RADIOGRAPHIC STUDIES:  No results found.   ASSESSMENT/PLAN:  This is a very pleasant 67 year old African-American female with stage IV (T1c, N1, M1c) non-small cell lung cancer, adenocarcinoma. She presented with a dominant left upper lobe lung nodule in addition to a right upper lobe pulmonary nodule with left hilar lymphadenopathy. She also has metastatic disease to the brain. She has no actionable mutations and her PDL1 expression is  1%. She was diagnosed in July 2020. She also has KRASG12C which is a targetable mutation that can be used in the second line setting.    The patient completed SBRT to the metastatic brain lesion under the care of Dr. Basilio Cairo in August 2020. She also had SRS again on 09/02/20 and 12/16/20.   The patient is currently undergoing systemic chemotherapy with carboplatin for an AUC of 5, Alimta 500 mg/m, and Keytruda 200 mg IV every 3 weeks.  She is status post 64 cycles of treatment.  Starting from cycle #5, she has been on maintenance Alimta 500 mg/m2 and Keytruda 200 mg IV. She tolerated her treatment well without any concerning adverse side effects. Alimta was discontinued due to renal insufficiency.      Labs were reviewed.  Her creatinine is *** today. She has baseline CKD and follows closely with nephrology.  Recommend that she proceed with cycle 65 today scheduled.  She will see her back for follow-up visit in 3 weeks for evaluation and repeat blood work before starting cycle #66.   I will arrange for restaging CT scan the chest, abdomen, pelvis prior to her next appointment.  The patient was advised to call immediately if she has any concerning symptoms in the interval. The patient voices understanding of current disease status and treatment options and is in agreement with the current care plan. All questions were answered. The patient knows to call the clinic with any problems, questions or concerns. We can certainly see the patient much sooner if  necessary   No orders of the defined types were placed in this encounter.    I spent {CHL ONC TIME VISIT - WUJWJ:[id number]} counseling the patient face to face. The total time spent in the appointment was {CHL ONC TIME VISIT - OZHYQ:[id number]}.  Charise Leinbach L Aracelli Woloszyn, PA-C 06/04/22

## 2022-06-08 ENCOUNTER — Inpatient Hospital Stay: Payer: Medicare Other

## 2022-06-08 ENCOUNTER — Other Ambulatory Visit: Payer: Self-pay

## 2022-06-08 ENCOUNTER — Inpatient Hospital Stay (HOSPITAL_BASED_OUTPATIENT_CLINIC_OR_DEPARTMENT_OTHER): Payer: Medicare Other | Admitting: Physician Assistant

## 2022-06-08 VITALS — BP 121/60 | HR 59 | Resp 16

## 2022-06-08 VITALS — BP 112/58 | HR 62 | Temp 98.7°F | Resp 17 | Wt 190.3 lb

## 2022-06-08 DIAGNOSIS — C3412 Malignant neoplasm of upper lobe, left bronchus or lung: Secondary | ICD-10-CM | POA: Diagnosis not present

## 2022-06-08 DIAGNOSIS — C7931 Secondary malignant neoplasm of brain: Secondary | ICD-10-CM | POA: Diagnosis not present

## 2022-06-08 DIAGNOSIS — Z79899 Other long term (current) drug therapy: Secondary | ICD-10-CM | POA: Diagnosis not present

## 2022-06-08 DIAGNOSIS — C3492 Malignant neoplasm of unspecified part of left bronchus or lung: Secondary | ICD-10-CM | POA: Diagnosis not present

## 2022-06-08 DIAGNOSIS — Z5112 Encounter for antineoplastic immunotherapy: Secondary | ICD-10-CM | POA: Diagnosis not present

## 2022-06-08 DIAGNOSIS — Z95828 Presence of other vascular implants and grafts: Secondary | ICD-10-CM

## 2022-06-08 LAB — CBC WITH DIFFERENTIAL (CANCER CENTER ONLY)
Abs Immature Granulocytes: 0.04 10*3/uL (ref 0.00–0.07)
Basophils Absolute: 0 10*3/uL (ref 0.0–0.1)
Basophils Relative: 1 %
Eosinophils Absolute: 0.6 10*3/uL — ABNORMAL HIGH (ref 0.0–0.5)
Eosinophils Relative: 8 %
HCT: 40 % (ref 36.0–46.0)
Hemoglobin: 12.3 g/dL (ref 12.0–15.0)
Immature Granulocytes: 1 %
Lymphocytes Relative: 21 %
Lymphs Abs: 1.6 10*3/uL (ref 0.7–4.0)
MCH: 24.8 pg — ABNORMAL LOW (ref 26.0–34.0)
MCHC: 30.8 g/dL (ref 30.0–36.0)
MCV: 80.8 fL (ref 80.0–100.0)
Monocytes Absolute: 0.6 10*3/uL (ref 0.1–1.0)
Monocytes Relative: 8 %
Neutro Abs: 4.6 10*3/uL (ref 1.7–7.7)
Neutrophils Relative %: 61 %
Platelet Count: 255 10*3/uL (ref 150–400)
RBC: 4.95 MIL/uL (ref 3.87–5.11)
RDW: 16.4 % — ABNORMAL HIGH (ref 11.5–15.5)
WBC Count: 7.6 10*3/uL (ref 4.0–10.5)
nRBC: 0 % (ref 0.0–0.2)

## 2022-06-08 LAB — CMP (CANCER CENTER ONLY)
ALT: 11 U/L (ref 0–44)
AST: 17 U/L (ref 15–41)
Albumin: 3.9 g/dL (ref 3.5–5.0)
Alkaline Phosphatase: 79 U/L (ref 38–126)
Anion gap: 7 (ref 5–15)
BUN: 17 mg/dL (ref 8–23)
CO2: 28 mmol/L (ref 22–32)
Calcium: 9.2 mg/dL (ref 8.9–10.3)
Chloride: 106 mmol/L (ref 98–111)
Creatinine: 1.74 mg/dL — ABNORMAL HIGH (ref 0.44–1.00)
GFR, Estimated: 32 mL/min — ABNORMAL LOW (ref >60)
Glucose, Bld: 152 mg/dL — ABNORMAL HIGH (ref 70–99)
Potassium: 4.4 mmol/L (ref 3.5–5.1)
Sodium: 141 mmol/L (ref 135–145)
Total Bilirubin: 0.3 mg/dL (ref 0.3–1.2)
Total Protein: 6.7 g/dL (ref 6.5–8.1)

## 2022-06-08 LAB — TSH: TSH: 2.667 u[IU]/mL (ref 0.350–4.500)

## 2022-06-08 MED ORDER — SODIUM CHLORIDE 0.9 % IV SOLN: Freq: Once | INTRAVENOUS | Status: AC

## 2022-06-08 MED ORDER — SODIUM CHLORIDE 0.9% FLUSH
10.0000 mL | INTRAVENOUS | Status: DC | PRN
  Administered 2022-06-08: 10 mL

## 2022-06-08 MED ORDER — SODIUM CHLORIDE 0.9 % IV SOLN
200.0000 mg | Freq: Once | INTRAVENOUS | Status: AC
  Administered 2022-06-08: 200 mg via INTRAVENOUS
  Filled 2022-06-08: qty 200

## 2022-06-08 MED ORDER — HEPARIN SOD (PORK) LOCK FLUSH 100 UNIT/ML IV SOLN
500.0000 [IU] | Freq: Once | INTRAVENOUS | Status: AC | PRN
  Administered 2022-06-08: 500 [IU]

## 2022-06-08 NOTE — Patient Instructions (Signed)
[facility]  Discharge Instructions: Thank you for choosing [facility] to provide your oncology and hematology care.   If you have a lab appointment with the Cancer Center, please go directly to the Cancer Center and check in at the registration area.   Wear comfortable clothing and clothing appropriate for easy access to any Portacath or PICC line.   We strive to give you quality time with your provider. You may need to reschedule your appointment if you arrive late (15 or more minutes).  Arriving late affects you and other patients whose appointments are after yours.  Also, if you miss three or more appointments without notifying the office, you may be dismissed from the clinic at the provider's discretion.      For prescription refill requests, have your pharmacy contact our office and allow 72 hours for refills to be completed.    Today you received the following chemotherapy and/or immunotherapy agents: pembrolizumab      To help prevent nausea and vomiting after your treatment, we encourage you to take your nausea medication as directed.  BELOW ARE SYMPTOMS THAT SHOULD BE REPORTED IMMEDIATELY: *FEVER GREATER THAN 100.4 F (38 C) OR HIGHER *CHILLS OR SWEATING *NAUSEA AND VOMITING THAT IS NOT CONTROLLED WITH YOUR NAUSEA MEDICATION *UNUSUAL SHORTNESS OF BREATH *UNUSUAL BRUISING OR BLEEDING *URINARY PROBLEMS (pain or burning when urinating, or frequent urination) *BOWEL PROBLEMS (unusual diarrhea, constipation, pain near the anus) TENDERNESS IN MOUTH AND THROAT WITH OR WITHOUT PRESENCE OF ULCERS (sore throat, sores in mouth, or a toothache) UNUSUAL RASH, SWELLING OR PAIN  UNUSUAL VAGINAL DISCHARGE OR ITCHING   Items with * indicate a potential emergency and should be followed up as soon as possible or go to the Emergency Department if any problems should occur.  Please show the CHEMOTHERAPY ALERT CARD or IMMUNOTHERAPY ALERT CARD at  check-in to the Emergency Department and triage nurse.  Should you have questions after your visit or need to cancel or reschedule your appointment, please contact [facility]  Dept: [phone]  and follow the prompts.  Office hours are 8:00 a.m. to 4:30 p.m. Monday - Friday. Please note that voicemails left after 4:00 p.m. may not be returned until the following business day.  We are closed weekends and major holidays. You have access to a nurse at all times for urgent questions. Please call the main number to the clinic Dept: [phone] and follow the prompts.   For any non-urgent questions, you may also contact your provider using MyChart. We now offer e-Visits for anyone 18 and older to request care online for non-urgent symptoms. For details visit [email or web address].   Also download the MyChart app! Go to the app store, search "MyChart", open the app, select [facility], and log in with your MyChart username and password.   

## 2022-06-08 NOTE — Progress Notes (Signed)
Patients SR Creatinine is 1.74.  Spoke with provider and patient is ok to treat today with this lab level.  Infusion nurse made aware.

## 2022-06-08 NOTE — Progress Notes (Signed)
Patient seen by Cassie Heilingoetter, PA-C  Vitals are within treatment parameters.  Labs reviewed: and are within treatment parameters.  Per physician team, patient is ready for treatment and there are NO modifications to the treatment plan.  

## 2022-06-25 ENCOUNTER — Ambulatory Visit
Admission: RE | Admit: 2022-06-25 | Discharge: 2022-06-25 | Disposition: A | Discharge status: Home / Self Care | Payer: Medicare Other | Source: Ambulatory Visit | Attending: Physician Assistant | Admitting: Physician Assistant

## 2022-06-25 DIAGNOSIS — N2889 Other specified disorders of kidney and ureter: Secondary | ICD-10-CM | POA: Diagnosis not present

## 2022-06-25 DIAGNOSIS — H43393 Other vitreous opacities, bilateral: Secondary | ICD-10-CM | POA: Diagnosis not present

## 2022-06-25 DIAGNOSIS — R918 Other nonspecific abnormal finding of lung field: Secondary | ICD-10-CM | POA: Diagnosis not present

## 2022-06-25 DIAGNOSIS — E119 Type 2 diabetes mellitus without complications: Secondary | ICD-10-CM | POA: Diagnosis not present

## 2022-06-25 DIAGNOSIS — C801 Malignant (primary) neoplasm, unspecified: Secondary | ICD-10-CM | POA: Diagnosis not present

## 2022-06-25 DIAGNOSIS — C3492 Malignant neoplasm of unspecified part of left bronchus or lung: Secondary | ICD-10-CM

## 2022-06-25 DIAGNOSIS — C7931 Secondary malignant neoplasm of brain: Secondary | ICD-10-CM | POA: Diagnosis not present

## 2022-06-29 ENCOUNTER — Ambulatory Visit: Payer: Medicare Other

## 2022-06-29 ENCOUNTER — Inpatient Hospital Stay: Payer: Medicare Other

## 2022-06-29 ENCOUNTER — Inpatient Hospital Stay: Payer: Medicare Other | Attending: Physician Assistant | Admitting: Internal Medicine

## 2022-06-29 ENCOUNTER — Other Ambulatory Visit: Payer: Self-pay

## 2022-06-29 VITALS — BP 142/66 | HR 60 | Temp 97.5°F | Resp 16 | Wt 192.1 lb

## 2022-06-29 DIAGNOSIS — Z95828 Presence of other vascular implants and grafts: Secondary | ICD-10-CM

## 2022-06-29 DIAGNOSIS — C3492 Malignant neoplasm of unspecified part of left bronchus or lung: Secondary | ICD-10-CM | POA: Diagnosis not present

## 2022-06-29 DIAGNOSIS — N289 Disorder of kidney and ureter, unspecified: Secondary | ICD-10-CM | POA: Diagnosis not present

## 2022-06-29 DIAGNOSIS — C7931 Secondary malignant neoplasm of brain: Secondary | ICD-10-CM | POA: Insufficient documentation

## 2022-06-29 DIAGNOSIS — C3490 Malignant neoplasm of unspecified part of unspecified bronchus or lung: Secondary | ICD-10-CM

## 2022-06-29 DIAGNOSIS — C3412 Malignant neoplasm of upper lobe, left bronchus or lung: Secondary | ICD-10-CM | POA: Diagnosis not present

## 2022-06-29 DIAGNOSIS — Z79899 Other long term (current) drug therapy: Secondary | ICD-10-CM | POA: Diagnosis not present

## 2022-06-29 LAB — CBC WITH DIFFERENTIAL (CANCER CENTER ONLY)
Abs Immature Granulocytes: 0.02 10*3/uL (ref 0.00–0.07)
Basophils Absolute: 0 10*3/uL (ref 0.0–0.1)
Basophils Relative: 1 %
Eosinophils Absolute: 0.7 10*3/uL — ABNORMAL HIGH (ref 0.0–0.5)
Eosinophils Relative: 11 %
HCT: 39.8 % (ref 36.0–46.0)
Hemoglobin: 12.2 g/dL (ref 12.0–15.0)
Immature Granulocytes: 0 %
Lymphocytes Relative: 22 %
Lymphs Abs: 1.4 10*3/uL (ref 0.7–4.0)
MCH: 24.7 pg — ABNORMAL LOW (ref 26.0–34.0)
MCHC: 30.7 g/dL (ref 30.0–36.0)
MCV: 80.6 fL (ref 80.0–100.0)
Monocytes Absolute: 0.6 10*3/uL (ref 0.1–1.0)
Monocytes Relative: 10 %
Neutro Abs: 3.6 10*3/uL (ref 1.7–7.7)
Neutrophils Relative %: 56 %
Platelet Count: 254 10*3/uL (ref 150–400)
RBC: 4.94 MIL/uL (ref 3.87–5.11)
RDW: 16.4 % — ABNORMAL HIGH (ref 11.5–15.5)
WBC Count: 6.4 10*3/uL (ref 4.0–10.5)
nRBC: 0 % (ref 0.0–0.2)

## 2022-06-29 LAB — CMP (CANCER CENTER ONLY)
ALT: 15 U/L (ref 0–44)
AST: 22 U/L (ref 15–41)
Albumin: 4 g/dL (ref 3.5–5.0)
Alkaline Phosphatase: 88 U/L (ref 38–126)
Anion gap: 7 (ref 5–15)
BUN: 14 mg/dL (ref 8–23)
CO2: 27 mmol/L (ref 22–32)
Calcium: 9.1 mg/dL (ref 8.9–10.3)
Chloride: 106 mmol/L (ref 98–111)
Creatinine: 1.51 mg/dL — ABNORMAL HIGH (ref 0.44–1.00)
GFR, Estimated: 37 mL/min — ABNORMAL LOW (ref >60)
Glucose, Bld: 130 mg/dL — ABNORMAL HIGH (ref 70–99)
Potassium: 3.9 mmol/L (ref 3.5–5.1)
Sodium: 140 mmol/L (ref 135–145)
Total Bilirubin: 0.4 mg/dL (ref 0.3–1.2)
Total Protein: 6.9 g/dL (ref 6.5–8.1)

## 2022-06-29 LAB — TSH: TSH: 3.079 u[IU]/mL (ref 0.350–4.500)

## 2022-06-29 MED ORDER — SODIUM CHLORIDE 0.9% FLUSH
10.0000 mL | INTRAVENOUS | Status: DC | PRN
  Administered 2022-06-29: 10 mL

## 2022-06-29 MED ORDER — HEPARIN SOD (PORK) LOCK FLUSH 100 UNIT/ML IV SOLN
500.0000 [IU] | Freq: Once | INTRAVENOUS | Status: AC | PRN
  Administered 2022-06-29: 500 [IU]

## 2022-06-29 NOTE — Progress Notes (Signed)
[facility] [phone]   Fax:([phone]) [phone]  OFFICE PROGRESS NOTE  [name], FNP [address] [place] [address]  DIAGNOSIS: Stage IV (T1c, N1, M1c ) non-small cell lung cancer, adenocarcinoma diagnosed in July 2020 and presented with left upper lobe lung nodule in addition to right upper lobe lung nodule with left hilar lymphadenopathy as well as multiple brain metastasis.  Biomarker Findings Microsatellite status - MS-Stable Tumor Mutational Burden - 4 Muts/Mb Genomic Findings For a complete list of the genes assayed, please refer to the Appendix. ATM G204* KRAS G12C PDGFRA P122T SMO R199W 7 Disease relevant genes with no reportable alterations: ALK, BRAF, EGFR, ERBB2, MET, RET, ROS1  PDL 1 expression was 1%  PRIOR THERAPY:  1) SRS to multiple brain metastasis under the care of Dr. Basilio Cairo. 2) SRS to a 4 mm new brain metastasis under the care of Dr. Basilio Cairo and Dr. Wynetta Emery on 09/02/2020. 3) Systemic chemotherapy with carboplatin for AUC of 5, Alimta 500 mg/M2 and Keytruda 200 mg IV every 3 weeks.  First dose October 05, 2018.  Status post 65 cycles.  Starting from cycle #5 the patient will be on treatment with maintenance Alimta 500 mg/M2 and Keytruda 200 mg IV every 3 weeks.  She has been on treatment with single agent Keytruda starting from cycle #9 secondary to renal insufficiency.  Last dose was given June 08, 2022 after the patient has been on treatment for more than 5 years with no evidence of progression.  CURRENT THERAPY: Observation.  INTERVAL HISTORY: Carolyn [name] 67 y.o. female returns to the clinic today for follow-up visit.  The patient is feeling fine today with no concerning complaints except for the mild shortness of breath with exertion.  She denied having any current chest pain, cough or hemoptysis.  She has no nausea, vomiting, diarrhea or constipation.  She has no headache or visual changes.  She  denied having any fever or chills.  She has no recent weight loss or night sweats.  She has been tolerating her treatment with Keytruda fairly well.  The patient had repeat CT scan of the chest, abdomen and pelvis performed recently and she is here for evaluation and discussion of her scan results.  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  MEDICATIONS:  Current Outpatient Medications  Medication Sig Dispense Refill   acetaminophen (TYLENOL) 500 MG tablet Take 1,000 mg by mouth every 8 (eight) hours as needed for mild pain, fever or headache.      amLODipine (NORVASC) 10 MG  tablet Take 1 tablet (10 mg total) by mouth daily. 90 tablet 1   aspirin EC 81 MG tablet Take 81 mg by mouth daily.     carvedilol (COREG) 25 MG tablet Take 1/2 (one-half) tablet by mouth twice daily 180 tablet 2   Cholecalciferol (VITAMIN D-3) 125 MCG (5000 UT) TABS Take 5,000 Units by mouth daily.     dexamethasone (DECADRON) 4 MG tablet Take 1 tablet (4 mg total) by mouth daily. (Patient not taking: Reported on 04/06/2022) 10 tablet 0   empagliflozin (JARDIANCE) 25 MG TABS tablet Take 1 tablet (25 mg total) by mouth daily before breakfast. 90 tablet 3   esomeprazole (NEXIUM) 20 MG capsule Take 20 mg by mouth daily with  breakfast.     ezetimibe (ZETIA) 10 MG tablet Take 1 tablet (10 mg total) by mouth daily. 90 tablet 2   fexofenadine (ALLEGRA) 180 MG tablet Take 180 mg by mouth daily.     fluticasone (FLONASE) 50 MCG/ACT nasal spray Place 1 spray into both nostrils daily as needed for allergies or rhinitis.     hydroxychloroquine (PLAQUENIL) 200 MG tablet Take 200 mg by mouth daily.     Lancets (ONETOUCH DELICA PLUS LANCET33G) MISC SMARTSIG:1 Topical 3 Times Daily     levocetirizine (XYZAL) 5 MG tablet Take 5 mg by mouth every evening.     lidocaine-prilocaine (EMLA) cream Apply to the Port-A-Cath site 30 to 60 minutes before chemotherapy. 30 g 2   Melatonin 1 MG CAPS Take 2 mg by mouth at bedtime as needed (for insomnia).     metFORMIN (GLUCOPHAGE) 500 MG tablet Take 500 mg by mouth daily with supper.     methocarbamol (ROBAXIN) 500 MG tablet Take 1 tablet (500 mg total) by mouth every 8 (eight) hours as needed for muscle spasms. (Patient not taking: Reported on 04/06/2022) 30 tablet 0   montelukast (SINGULAIR) 10 MG tablet Take 10 mg by mouth daily as needed (allergies).     nitroGLYCERIN (NITROSTAT) 0.4 MG SL tablet Place 1 tablet (0.4 mg total) under the tongue every 5 (five) minutes as needed for chest pain. (Patient not taking: Reported on 04/06/2022) 25 tablet 2   ONETOUCH ULTRA test strip USE 1 STRIP TO CHECK GLUCOSE THREE TIMES DAILY     Polyethyl Glycol-Propyl Glycol (SYSTANE) 0.4-0.3 % GEL ophthalmic gel Place 1 application. into both eyes every 6 (six) hours as needed (dry eyes).     telmisartan (MICARDIS) 40 MG tablet Take 40 mg by mouth daily.     No current facility-administered medications for this visit.   Facility-Administered Medications Ordered in Other Visits  Medication Dose Route Frequency Provider Last Rate Last Admin   sodium chloride flush (NS) 0.9 % injection 10 mL  10 mL Intracatheter PRN Si Gaul, MD   10 mL at 06/29/22 0810    SURGICAL HISTORY:  Past Surgical History:   Procedure Laterality Date   ABDOMINAL HYSTERECTOMY     bone spur     CARDIAC CATHETERIZATION  06/13/2012   carpel tunnel surgery Left    COLONOSCOPY     9 years ago   CORONARY ANGIOPLASTY  06/13/2012   EYE SURGERY     cyst left eye    IR IMAGING GUIDED PORT INSERTION  10/10/2018   LEFT HEART CATHETERIZATION WITH CORONARY ANGIOGRAM N/A 06/13/2012   Procedure: LEFT HEART CATHETERIZATION WITH CORONARY ANGIOGRAM;  Surgeon: Pamella Pert, MD;  Location: [facility] CATH [facility];  Service: Cardiovascular;  Laterality: N/A;   NECK  SURGERY     rotator cuff surgery Right 2015   SHOULDER ARTHROSCOPY WITH ROTATOR CUFF REPAIR AND SUBACROMIAL DECOMPRESSION Left 05/14/2021   Procedure: LEFT SHOULDER ARTHROSCOPY, SUBACROMIAL DECOMPRESSION, BICEPS TENODESIS, MINI OPEN ROTATOR CUFF REPAIR;  Surgeon: Cammy Copa, MD;  Location: [facility] OR;  Service: Orthopedics;  Laterality: Left;   VIDEO BRONCHOSCOPY WITH ENDOBRONCHIAL NAVIGATION N/A 09/06/2018   Procedure: VIDEO BRONCHOSCOPY WITH ENDOBRONCHIAL NAVIGATION, left upper lung;  Surgeon: Corliss Skains, MD;  Location: [facility] OR;  Service: Thoracic;  Laterality: N/A;   VIDEO BRONCHOSCOPY WITH ENDOBRONCHIAL ULTRASOUND Left 09/06/2018   Procedure: VIDEO BRONCHOSCOPY WITH ENDOBRONCHIAL ULTRASOUND, left lung;  Surgeon: Corliss Skains, MD;  Location: [facility] OR;  Service: Thoracic;  Laterality: Left;   WISDOM TOOTH EXTRACTION      REVIEW OF SYSTEMS:  Constitutional: negative Eyes: negative Ears, nose, mouth, throat, and face: negative Respiratory: positive for dyspnea on exertion Cardiovascular: negative Gastrointestinal: negative Genitourinary:negative Integument/breast: negative Hematologic/lymphatic: negative Musculoskeletal:negative Neurological: negative Behavioral/Psych: negative Endocrine: negative Allergic/Immunologic: negative   PHYSICAL EXAMINATION: General appearance: alert, cooperative, and no distress Head: Normocephalic, without obvious abnormality,  atraumatic Neck: no adenopathy, no JVD, supple, symmetrical, trachea midline, and thyroid not enlarged, symmetric, no tenderness/mass/nodules Lymph nodes: Cervical, supraclavicular, and axillary nodes normal. Resp: clear to auscultation bilaterally Back: symmetric, no curvature. ROM normal. No CVA tenderness. Cardio: regular rate and rhythm, S1, S2 normal, no murmur, click, rub or gallop GI: soft, non-tender; bowel sounds normal; no masses,  no organomegaly Extremities: extremities normal, atraumatic, no cyanosis or edema Neurologic: Alert and oriented X 3, normal strength and tone. Normal symmetric reflexes. Normal coordination and gait  ECOG PERFORMANCE STATUS: 1 - Symptomatic but completely ambulatory  Blood pressure (!) 142/66, pulse 60, temperature (!) 97.5 F (36.4 C), temperature source Temporal, resp. rate 16, weight 192 lb 1.6 oz (87.1 kg), SpO2 100 %.  LABORATORY DATA: Lab Results  Component Value Date   WBC 7.6 06/08/2022   HGB 12.3 06/08/2022   HCT 40.0 06/08/2022   MCV 80.8 06/08/2022   PLT 255 06/08/2022      Chemistry      Component Value Date/Time   NA 141 06/08/2022 0755   NA 139 08/18/2020 0801   K 4.4 06/08/2022 0755   CL 106 06/08/2022 0755   CO2 28 06/08/2022 0755   BUN 17 06/08/2022 0755   BUN 16 08/18/2020 0801   CREATININE 1.74 (H) 06/08/2022 0755      Component Value Date/Time   CALCIUM 9.2 06/08/2022 0755   ALKPHOS 79 06/08/2022 0755   AST 17 06/08/2022 0755   ALT 11 06/08/2022 0755   BILITOT 0.3 06/08/2022 0755       RADIOGRAPHIC STUDIES: CT CHEST ABDOMEN PELVIS WO CONTRAST  Result Date: 06/27/2022 CLINICAL DATA:  67 year old female with history of non-small cell lung cancer (adenocarcinoma with metastatic disease to the brain. Follow-up study. * Tracking Code: BO * EXAM: CT CHEST, ABDOMEN AND PELVIS WITHOUT CONTRAST TECHNIQUE: Multidetector CT imaging of the chest, abdomen and pelvis was performed following the standard protocol without IV  contrast. RADIATION DOSE REDUCTION: This exam was performed according to the departmental dose-optimization program which includes automated exposure control, adjustment of the mA and/or kV according to patient size and/or use of iterative reconstruction technique. COMPARISON:  CT of the chest, abdomen and pelvis 03/22/2022. FINDINGS: CT CHEST FINDINGS Cardiovascular: Heart size is normal. Trace amount of pericardial fluid and/or thickening, decreased compared to the prior study and unlikely to be of any hemodynamic significance  at this time. No pericardial calcification. There is aortic atherosclerosis, as well as atherosclerosis of the great vessels of the mediastinum and the coronary arteries, including calcified atherosclerotic plaque in the left anterior descending, left circumflex and right coronary arteries. Right internal jugular single-lumen Port-A-Cath with tip terminating at the superior cavoatrial junction. Mediastinum/Nodes: No pathologically enlarged mediastinal or hilar lymph nodes. Please note that accurate exclusion of hilar adenopathy is limited on noncontrast CT scans. Esophagus is unremarkable in appearance. No axillary lymphadenopathy. Lungs/Pleura: Small nodular area of architectural distortion in the left upper lobe measuring 8 x 7 mm (axial image 45 of series 3) is stable, corresponding to treated pulmonary nodule. Stable postradiation changes in the surrounding lung where there is mild architectural distortion and ground-glass attenuation. 8 x 5 mm area of architectural distortion in the periphery of the right upper lobe (axial image 38 of series 3) also unchanged, also likely scarring at site of other treated neoplasm. 5 mm ground-glass attenuation nodule in the right middle lobe (axial image 68 of series 3) is unchanged. No other new suspicious appearing pulmonary nodules or masses are noted. No acute consolidative airspace disease. No pleural effusions. Mild diffuse bronchial wall  thickening with mild centrilobular and paraseptal emphysema. Musculoskeletal: There are no aggressive appearing lytic or blastic lesions noted in the visualized portions of the skeleton. CT ABDOMEN PELVIS FINDINGS Hepatobiliary: No definite suspicious cystic or solid hepatic lesions are confidently identified on today's noncontrast CT examination. Unenhanced appearance of the gallbladder is unremarkable. Pancreas: No definite pancreatic mass or peripancreatic fluid collections or inflammatory changes are noted on today's noncontrast CT examination. Spleen: Unremarkable. Adrenals/Urinary Tract: Multifocal cortical thinning and irregular contour in the kidneys bilaterally, similar to prior studies, presumably areas of chronic post infectious or inflammatory scarring. No definite suspicious renal lesions identified on today's noncontrast CT examination. Bilateral adrenal glands are normal in appearance. No hydroureteronephrosis. Urinary bladder is normal in appearance. Stomach/Bowel: Unenhanced appearance of the stomach is normal. There is no pathologic dilatation of small bowel or colon. Normal appendix. Vascular/Lymphatic: Atherosclerotic calcifications are noted in the abdominal aorta and pelvic vasculature. No lymphadenopathy noted in the abdomen or pelvis. Reproductive: Status post hysterectomy. Ovaries are not confidently identified may be surgically absent or atrophic. Other: No significant volume of ascites.  No pneumoperitoneum. Musculoskeletal: 5 mm of anterolisthesis of L4 upon L5 is unchanged. There are no aggressive appearing lytic or blastic lesions noted in the visualized portions of the skeleton. IMPRESSION: 1. Stable examination demonstrating treated upper lobe nodules bilaterally, with no definitive findings to suggest metastatic disease in the chest, abdomen or pelvis. 2. 5 mm ground-glass attenuation nodule in the right middle lobe is unchanged. Continued attention on follow-up studies is  recommended. 3. Mild diffuse bronchial wall thickening with mild centrilobular and paraseptal emphysema; imaging findings suggestive of underlying COPD. 4. Aortic atherosclerosis, in addition to three-vessel coronary artery disease. Please note that although the presence of coronary artery calcium documents the presence of coronary artery disease, the severity of this disease and any potential stenosis cannot be assessed on this non-gated CT examination. Assessment for potential risk factor modification, dietary therapy or pharmacologic therapy may be warranted, if clinically indicated. 5. Additional incidental imaging findings, as above. Electronically Signed   By: Trudie Reed M.D.   On: 06/27/2022 12:18     ASSESSMENT AND PLAN: This is a very pleasant 67 years old African-American female with likely stage IV (T1c, N1, M1C) non-small cell lung cancer, adenocarcinoma with positive KRAS G12C  mutation diagnosed in July 2020 and presented with left upper lobe lung nodule in addition to right upper lobe pulmonary nodule as well as left hilar adenopathy and metastatic lesion to the brain. Molecular studies showed no actionable mutation and PDL 1 expression was 1%. The patient underwent SBRT to the metastatic brain lesion under the care of Dr. Basilio Cairo. The patient is currently undergoing systemic chemotherapy with carboplatin for AUC of 5, Alimta 500 mg/M2 and Keytruda 200 mg IV every 3 weeks is status post 65 cycles.  Starting from cycle #5 she is on maintenance treatment with Alimta and Keytruda every 3 weeks with only Keytruda on the last few cycles because of the renal insufficiency. The patient had repeat CT scan of the chest, abdomen and pelvis performed recently.  I personally and independently reviewed the scan and discussed the result with the patient today. Her scan showed no concerning findings for disease progression. I discussed with the patient her option regarding continuation of treatment  versus stopping the treatment at this point since she has been on the treatment for more than 5 years with no evidence for disease progression. The patient is now interested in discontinuing the treatment and be on observation and close monitoring. I will see her back for follow-up visit in 3 months for evaluation with repeat CT scan of the chest, abdomen and pelvis for restaging of her disease. For the brain metastasis, she is followed by Dr. Barbaraann Cao and she is supposed to have another MRI of the brain in July 2024. For the renal insufficiency, she is followed by nephrology. She will have Port-A-Cath flush every 6 weeks. The patient was advised to call immediately if she has any concerning symptoms in the interval.  The patient voices understanding of current disease status and treatment options and is in agreement with the current care plan. All questions were answered. The patient knows to call the clinic with any problems, questions or concerns. We can certainly see the patient much sooner if necessary. The total time spent in the appointment was 30 minutes.  Disclaimer: This note was dictated with voice recognition software. Similar sounding words can inadvertently be transcribed and may not be corrected upon review.

## 2022-06-30 DIAGNOSIS — Z90710 Acquired absence of both cervix and uterus: Secondary | ICD-10-CM | POA: Diagnosis not present

## 2022-06-30 DIAGNOSIS — Z1239 Encounter for other screening for malignant neoplasm of breast: Secondary | ICD-10-CM | POA: Diagnosis not present

## 2022-06-30 DIAGNOSIS — Z1211 Encounter for screening for malignant neoplasm of colon: Secondary | ICD-10-CM | POA: Diagnosis not present

## 2022-06-30 DIAGNOSIS — Z01419 Encounter for gynecological examination (general) (routine) without abnormal findings: Secondary | ICD-10-CM | POA: Diagnosis not present

## 2022-06-30 DIAGNOSIS — B354 Tinea corporis: Secondary | ICD-10-CM | POA: Diagnosis not present

## 2022-06-30 DIAGNOSIS — Z6836 Body mass index (BMI) 36.0-36.9, adult: Secondary | ICD-10-CM | POA: Diagnosis not present

## 2022-06-30 DIAGNOSIS — Z13820 Encounter for screening for osteoporosis: Secondary | ICD-10-CM | POA: Diagnosis not present

## 2022-07-07 ENCOUNTER — Other Ambulatory Visit: Payer: Self-pay | Admitting: Nurse Practitioner

## 2022-07-07 DIAGNOSIS — R030 Elevated blood-pressure reading, without diagnosis of hypertension: Secondary | ICD-10-CM | POA: Diagnosis not present

## 2022-07-07 DIAGNOSIS — E669 Obesity, unspecified: Secondary | ICD-10-CM | POA: Diagnosis not present

## 2022-07-07 DIAGNOSIS — M79661 Pain in right lower leg: Secondary | ICD-10-CM

## 2022-07-07 DIAGNOSIS — Z6837 Body mass index (BMI) 37.0-37.9, adult: Secondary | ICD-10-CM | POA: Diagnosis not present

## 2022-07-08 DIAGNOSIS — M79604 Pain in right leg: Secondary | ICD-10-CM | POA: Diagnosis not present

## 2022-07-08 DIAGNOSIS — Z85118 Personal history of other malignant neoplasm of bronchus and lung: Secondary | ICD-10-CM | POA: Diagnosis not present

## 2022-07-08 DIAGNOSIS — M79661 Pain in right lower leg: Secondary | ICD-10-CM | POA: Diagnosis not present

## 2022-07-21 DIAGNOSIS — R0981 Nasal congestion: Secondary | ICD-10-CM | POA: Diagnosis not present

## 2022-07-22 DIAGNOSIS — Z6837 Body mass index (BMI) 37.0-37.9, adult: Secondary | ICD-10-CM | POA: Diagnosis not present

## 2022-07-22 DIAGNOSIS — R030 Elevated blood-pressure reading, without diagnosis of hypertension: Secondary | ICD-10-CM | POA: Diagnosis not present

## 2022-07-22 DIAGNOSIS — E669 Obesity, unspecified: Secondary | ICD-10-CM | POA: Diagnosis not present

## 2022-07-22 DIAGNOSIS — J019 Acute sinusitis, unspecified: Secondary | ICD-10-CM | POA: Diagnosis not present

## 2022-07-27 DIAGNOSIS — M79604 Pain in right leg: Secondary | ICD-10-CM | POA: Diagnosis not present

## 2022-07-27 DIAGNOSIS — I87393 Chronic venous hypertension (idiopathic) with other complications of bilateral lower extremity: Secondary | ICD-10-CM | POA: Diagnosis not present

## 2022-07-27 DIAGNOSIS — R252 Cramp and spasm: Secondary | ICD-10-CM | POA: Diagnosis not present

## 2022-07-27 DIAGNOSIS — M79661 Pain in right lower leg: Secondary | ICD-10-CM | POA: Diagnosis not present

## 2022-07-27 DIAGNOSIS — I872 Venous insufficiency (chronic) (peripheral): Secondary | ICD-10-CM | POA: Diagnosis not present

## 2022-08-10 ENCOUNTER — Inpatient Hospital Stay: Payer: Medicare Other | Attending: Physician Assistant

## 2022-08-10 DIAGNOSIS — Z780 Asymptomatic menopausal state: Secondary | ICD-10-CM | POA: Diagnosis not present

## 2022-08-10 DIAGNOSIS — Z803 Family history of malignant neoplasm of breast: Secondary | ICD-10-CM | POA: Diagnosis not present

## 2022-08-10 DIAGNOSIS — C7931 Secondary malignant neoplasm of brain: Secondary | ICD-10-CM | POA: Diagnosis not present

## 2022-08-10 DIAGNOSIS — Z87891 Personal history of nicotine dependence: Secondary | ICD-10-CM | POA: Diagnosis not present

## 2022-08-10 DIAGNOSIS — C3412 Malignant neoplasm of upper lobe, left bronchus or lung: Secondary | ICD-10-CM | POA: Insufficient documentation

## 2022-08-10 DIAGNOSIS — Z8701 Personal history of pneumonia (recurrent): Secondary | ICD-10-CM | POA: Insufficient documentation

## 2022-08-10 DIAGNOSIS — Z95828 Presence of other vascular implants and grafts: Secondary | ICD-10-CM

## 2022-08-10 LAB — CMP (CANCER CENTER ONLY)
ALT: 12 U/L (ref 0–44)
AST: 20 U/L (ref 15–41)
Albumin: 3.9 g/dL (ref 3.5–5.0)
Alkaline Phosphatase: 79 U/L (ref 38–126)
Anion gap: 6 (ref 5–15)
BUN: 18 mg/dL (ref 8–23)
CO2: 29 mmol/L (ref 22–32)
Calcium: 9.6 mg/dL (ref 8.9–10.3)
Chloride: 105 mmol/L (ref 98–111)
Creatinine: 1.45 mg/dL — ABNORMAL HIGH (ref 0.44–1.00)
GFR, Estimated: 39 mL/min — ABNORMAL LOW (ref >60)
Glucose, Bld: 84 mg/dL (ref 70–99)
Potassium: 4.3 mmol/L (ref 3.5–5.1)
Sodium: 140 mmol/L (ref 135–145)
Total Bilirubin: 0.4 mg/dL (ref 0.3–1.2)
Total Protein: 6.8 g/dL (ref 6.5–8.1)

## 2022-08-10 LAB — CBC WITH DIFFERENTIAL (CANCER CENTER ONLY)
Abs Immature Granulocytes: 0.01 10*3/uL (ref 0.00–0.07)
Basophils Absolute: 0 10*3/uL (ref 0.0–0.1)
Basophils Relative: 1 %
Eosinophils Absolute: 0.6 10*3/uL — ABNORMAL HIGH (ref 0.0–0.5)
Eosinophils Relative: 10 %
HCT: 41.7 % (ref 36.0–46.0)
Hemoglobin: 12.8 g/dL (ref 12.0–15.0)
Immature Granulocytes: 0 %
Lymphocytes Relative: 24 %
Lymphs Abs: 1.4 10*3/uL (ref 0.7–4.0)
MCH: 24.7 pg — ABNORMAL LOW (ref 26.0–34.0)
MCHC: 30.7 g/dL (ref 30.0–36.0)
MCV: 80.5 fL (ref 80.0–100.0)
Monocytes Absolute: 0.5 10*3/uL (ref 0.1–1.0)
Monocytes Relative: 9 %
Neutro Abs: 3.3 10*3/uL (ref 1.7–7.7)
Neutrophils Relative %: 56 %
Platelet Count: 261 10*3/uL (ref 150–400)
RBC: 5.18 MIL/uL — ABNORMAL HIGH (ref 3.87–5.11)
RDW: 16.4 % — ABNORMAL HIGH (ref 11.5–15.5)
WBC Count: 5.9 10*3/uL (ref 4.0–10.5)
nRBC: 0 % (ref 0.0–0.2)

## 2022-08-10 MED ORDER — SODIUM CHLORIDE 0.9% FLUSH
10.0000 mL | INTRAVENOUS | Status: DC | PRN
  Administered 2022-08-10: 10 mL

## 2022-08-10 MED ORDER — HEPARIN SOD (PORK) LOCK FLUSH 100 UNIT/ML IV SOLN
500.0000 [IU] | Freq: Once | INTRAVENOUS | Status: AC | PRN
  Administered 2022-08-10: 500 [IU]

## 2022-08-19 ENCOUNTER — Ambulatory Visit
Admission: RE | Admit: 2022-08-19 | Discharge: 2022-08-19 | Disposition: A | Discharge status: Home / Self Care | Payer: Medicare Other | Source: Ambulatory Visit | Attending: Internal Medicine | Admitting: Internal Medicine

## 2022-08-19 DIAGNOSIS — G9389 Other specified disorders of brain: Secondary | ICD-10-CM | POA: Diagnosis not present

## 2022-08-19 DIAGNOSIS — C7931 Secondary malignant neoplasm of brain: Secondary | ICD-10-CM

## 2022-08-19 MED ORDER — GADOPICLENOL 0.5 MMOL/ML IV SOLN
9.0000 mL | Freq: Once | INTRAVENOUS | Status: AC | PRN
  Administered 2022-08-19: 9 mL via INTRAVENOUS

## 2022-08-23 ENCOUNTER — Other Ambulatory Visit: Payer: Self-pay

## 2022-08-23 ENCOUNTER — Inpatient Hospital Stay (HOSPITAL_BASED_OUTPATIENT_CLINIC_OR_DEPARTMENT_OTHER): Payer: Medicare Other | Admitting: Internal Medicine

## 2022-08-23 VITALS — BP 134/60 | HR 64 | Temp 97.9°F | Resp 18 | Ht 61.0 in | Wt 195.2 lb

## 2022-08-23 DIAGNOSIS — Z87891 Personal history of nicotine dependence: Secondary | ICD-10-CM | POA: Diagnosis not present

## 2022-08-23 DIAGNOSIS — C7931 Secondary malignant neoplasm of brain: Secondary | ICD-10-CM | POA: Diagnosis not present

## 2022-08-23 DIAGNOSIS — Z803 Family history of malignant neoplasm of breast: Secondary | ICD-10-CM | POA: Diagnosis not present

## 2022-08-23 DIAGNOSIS — C3412 Malignant neoplasm of upper lobe, left bronchus or lung: Secondary | ICD-10-CM | POA: Diagnosis not present

## 2022-08-23 DIAGNOSIS — Z780 Asymptomatic menopausal state: Secondary | ICD-10-CM | POA: Diagnosis not present

## 2022-08-23 DIAGNOSIS — Z8701 Personal history of pneumonia (recurrent): Secondary | ICD-10-CM | POA: Diagnosis not present

## 2022-08-23 NOTE — Progress Notes (Signed)
[facility] [address]  [place], [place] [address] [phone]   Interval Evaluation  Date of Service: 08/23/22 Patient Name: Carolyn [name] Patient MRN: [id number] Patient DOB: 14-Feb-1955 Provider: Henreitta Leber, MD  Identifying Statement:  Carolyn [name] is a 67 y.o. female with Malignant neoplasm metastatic to brain [facility]) [C79.31]   Primary Cancer:  Oncologic History: Oncology History  Adenocarcinoma, lung (HCC)  09/12/2018 Initial Diagnosis   Adenocarcinoma, lung (HCC)   10/05/2018 - 06/08/2022 Chemotherapy   Patient is on Treatment Plan : LUNG Carboplatin (5) + Pemetrexed (500) + Pembrolizumab (200) D1 q21d Induction x 4 cycles / Maintenance Pemetrexed (500) + Pembrolizumab (200) D1 q21d     05/14/2020 Cancer Staging   Staging form: Lung, AJCC 8th Edition - Clinical: Stage IVB (cT1c, cN1, cM1c) - Signed by Si Gaul, MD on 05/14/2020    CNS Oncologic History: 09/22/18: Completes SRS to 9 sub-cm metastases [facility]) 09/02/20: SRS x1 [facility]) 12/15/20: SRS R frontal (SS)  Interval History:  Carolyn [name] presents today for follow up after recent MRI brain.  She denies new or progressive neurologic deficits today. No severe headaches reported. Remains phsyically and mentally active.  Now on observation with Dr. Arbutus Ped.  Medications: Current Outpatient Medications on File Prior to Visit  Medication Sig Dispense Refill   acetaminophen (TYLENOL) 500 MG tablet Take 1,000 mg by mouth every 8 (eight) hours as needed for mild pain, fever or headache.      amLODipine (NORVASC) 10 MG tablet Take 1 tablet (10 mg total) by mouth daily. 90 tablet 1   aspirin EC 81 MG tablet Take 81 mg by mouth daily.     carvedilol (COREG) 25 MG tablet Take 1/2 (one-half) tablet by mouth twice daily 180 tablet 2   Cholecalciferol (VITAMIN D-3) 125 MCG (5000 UT) TABS Take 5,000 Units by mouth daily.     dexamethasone (DECADRON) 4 MG tablet  Take 1 tablet (4 mg total) by mouth daily. (Patient not taking: Reported on 04/06/2022) 10 tablet 0   empagliflozin (JARDIANCE) 25 MG TABS tablet Take 1 tablet (25 mg total) by mouth daily before breakfast. 90 tablet 3   esomeprazole (NEXIUM) 20 MG capsule Take 20 mg by mouth daily with breakfast.     ezetimibe (ZETIA) 10 MG tablet Take 1 tablet (10 mg total) by mouth daily. 90 tablet 2   fexofenadine (ALLEGRA) 180 MG tablet Take 180 mg by mouth daily.     fluticasone (FLONASE) 50 MCG/ACT nasal spray Place 1 spray into both nostrils daily as needed for allergies or rhinitis.     hydroxychloroquine (PLAQUENIL) 200 MG tablet Take 200 mg by mouth daily.     Lancets (ONETOUCH DELICA PLUS LANCET33G) MISC SMARTSIG:1 Topical 3 Times Daily     levocetirizine (XYZAL) 5 MG tablet Take 5 mg by mouth every evening.     lidocaine-prilocaine (EMLA) cream Apply to the Port-A-Cath site 30 to 60 minutes before chemotherapy. 30 g 2   Melatonin 1 MG CAPS Take 2 mg by mouth at bedtime as needed (for insomnia).     metFORMIN (GLUCOPHAGE) 500 MG tablet Take 500 mg by mouth daily with supper.     methocarbamol (ROBAXIN) 500 MG tablet Take 1 tablet (500 mg total) by mouth every 8 (eight) hours as needed for muscle spasms. (Patient not taking: Reported on 04/06/2022) 30 tablet 0   montelukast (SINGULAIR) 10 MG tablet Take 10 mg by mouth daily as needed (  allergies).     nitroGLYCERIN (NITROSTAT) 0.4 MG SL tablet Place 1 tablet (0.4 mg total) under the tongue every 5 (five) minutes as needed for chest pain. (Patient not taking: Reported on 04/06/2022) 25 tablet 2   ONETOUCH ULTRA test strip USE 1 STRIP TO CHECK GLUCOSE THREE TIMES DAILY     Polyethyl Glycol-Propyl Glycol (SYSTANE) 0.4-0.3 % GEL ophthalmic gel Place 1 application. into both eyes every 6 (six) hours as needed (dry eyes).     telmisartan (MICARDIS) 40 MG tablet Take 40 mg by mouth daily.     No current facility-administered medications on file prior to visit.     Allergies:  Allergies  Allergen Reactions   Atorvastatin Other (See Comments)    Severe myalgia   Crestor [Rosuvastatin] Other (See Comments)    Severe myalgias   Lisinopril Swelling    Lips and face swelled up.   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   Hypertension    nscl ca dx'd 08/07/18   Persistent headaches    h/o   Pneumonia    hx of PNA   Shortness of breath    Vaginal dryness    h/o   Past Surgical History:  Past Surgical History:  Procedure Laterality Date   ABDOMINAL HYSTERECTOMY     bone spur     CARDIAC CATHETERIZATION  06/13/2012   carpel tunnel surgery Left    COLONOSCOPY     9 years ago   CORONARY ANGIOPLASTY  06/13/2012   EYE SURGERY     cyst left eye    IR IMAGING GUIDED PORT INSERTION  10/10/2018   LEFT HEART CATHETERIZATION WITH CORONARY ANGIOGRAM N/A 06/13/2012   Procedure: LEFT HEART CATHETERIZATION WITH CORONARY ANGIOGRAM;  Surgeon: Pamella Pert, MD;  Location: [facility] CATH LAB;  Service: Cardiovascular;  Laterality: N/A;   NECK SURGERY     rotator cuff surgery Right 2015   SHOULDER ARTHROSCOPY WITH ROTATOR CUFF REPAIR AND SUBACROMIAL DECOMPRESSION Left 05/14/2021   Procedure: LEFT SHOULDER ARTHROSCOPY, SUBACROMIAL DECOMPRESSION, BICEPS TENODESIS, MINI OPEN ROTATOR CUFF REPAIR;  Surgeon: Cammy Copa, MD;  Location: [facility] OR;  Service: Orthopedics;  Laterality: Left;   VIDEO BRONCHOSCOPY WITH ENDOBRONCHIAL  NAVIGATION N/A 09/06/2018   Procedure: VIDEO BRONCHOSCOPY WITH ENDOBRONCHIAL NAVIGATION, left upper lung;  Surgeon: Corliss Skains, MD;  Location: [facility] OR;  Service: Thoracic;  Laterality: N/A;   VIDEO BRONCHOSCOPY WITH ENDOBRONCHIAL ULTRASOUND Left 09/06/2018   Procedure: VIDEO BRONCHOSCOPY WITH ENDOBRONCHIAL ULTRASOUND, left lung;  Surgeon: Corliss Skains, MD;  Location: [facility] OR;  Service: Thoracic;  Laterality: Left;   WISDOM TOOTH EXTRACTION     Social History:  Social History   Socioeconomic History   Marital status: Married    Spouse name: Not on file   Number of children: 3  Years of education: Not on file   Highest education level: Not on file  Occupational History   Not on file  Tobacco Use   Smoking status: Former    Current packs/day: 0.00    Average packs/day: 0.5 packs/day for 30.0 years (15.0 ttl pk-yrs)    Types: Cigarettes    Start date: 02/08/1969    Quit date: 02/09/1999    Years since quitting: 23.5   Smokeless tobacco: Never  Vaping Use   Vaping status: Never Used  Substance and Sexual Activity   Alcohol use: Not Currently   Drug use: Not Currently   Sexual activity: Yes    Birth control/protection: Post-menopausal  Other Topics Concern   Not on file  Social History Narrative   Not on file   Social Determinants of Health   Financial Resource Strain: Not on file  Food Insecurity: Not on file  Transportation Needs: No Transportation Needs (08/29/2018)   PRAPARE - Administrator, Civil Service (Medical): No    Lack of Transportation (Non-Medical): No  Physical Activity: Not on file  Stress: Not on file  Social Connections: Not on file  Intimate Partner Violence: Not At Risk (08/29/2018)   Humiliation, Afraid, Rape, and Kick questionnaire    Fear of Current or Ex-Partner: No    Emotionally Abused: No    Physically Abused: No    Sexually Abused: No   Family History:  Family History  Problem Relation Age of Onset   Breast cancer  Other    Diabetes Mother    Glaucoma Mother    Hypertension Mother    Hypertension Father    Asthma Father    Diabetes Sister    Diabetes Brother    Hypertension Sister     Review of Systems: Constitutional: Doesn't report fevers, chills or abnormal weight loss Eyes: Doesn't report blurriness of vision Ears, nose, mouth, throat, and face: Doesn't report sore throat Respiratory: Doesn't report cough, dyspnea or wheezes Cardiovascular: Doesn't report palpitation, chest discomfort  Gastrointestinal:  Doesn't report nausea, constipation, diarrhea GU: Doesn't report incontinence Skin: Doesn't report skin rashes Neurological: Per HPI Musculoskeletal: Doesn't report joint pain Behavioral/Psych: Doesn't report anxiety  Physical Exam: Vitals:   08/23/22 0900  BP: 134/60  Pulse: 64  Resp: 18  Temp: 97.9 F (36.6 C)  SpO2: 100%    KPS: 90. General: Alert, cooperative, pleasant, in no acute distress Head: Normal EENT: No conjunctival injection or scleral icterus.  Lungs: Resp effort normal Cardiac: Regular rate Abdomen: Non-distended abdomen Skin: No rashes cyanosis or petechiae. Extremities: No clubbing or edema  Neurologic Exam: Mental Status: Awake, alert, attentive to examiner. Oriented to self and environment. Language is fluent with intact comprehension.  Cranial Nerves: Visual acuity is grossly normal. Visual fields are full. Extra-ocular movements intact. No ptosis. Face is symmetric Motor: Tone and bulk are normal. Power is full in both arms and legs. Reflexes are symmetric, no pathologic reflexes present.  Sensory: Intact to light touch Gait: Normal.   Labs: I have reviewed the data as listed    Component Value Date/Time   NA 140 08/10/2022 1256   NA 139 08/18/2020 0801   K 4.3 08/10/2022 1256   CL 105 08/10/2022 1256   CO2 29 08/10/2022 1256   GLUCOSE 84 08/10/2022 1256   BUN 18 08/10/2022 1256   BUN 16 08/18/2020 0801   CREATININE 1.45 (H) 08/10/2022  1256   CALCIUM 9.6 08/10/2022 1256   PROT 6.8 08/10/2022 1256  ALBUMIN 3.9 08/10/2022 1256   AST 20 08/10/2022 1256   ALT 12 08/10/2022 1256   ALKPHOS 79 08/10/2022 1256   BILITOT 0.4 08/10/2022 1256   GFRNONAA 39 (L) 08/10/2022 1256   GFRAA 37 (L) 11/12/2019 0917   GFRAA 37 (L) 11/08/2019 0900   Lab Results  Component Value Date   WBC 5.9 08/10/2022   NEUTROABS 3.3 08/10/2022   HGB 12.8 08/10/2022   HCT 41.7 08/10/2022   MCV 80.5 08/10/2022   PLT 261 08/10/2022    Imaging:  No results found.   [facility] Clinician Interpretation: I have personally reviewed the radiological images as listed.  My interpretation, in the context of the patient's clinical presentation, is stable disease pending official read   Assessment/Plan Malignant neoplasm metastatic to brain [facility]) [C79.31]  Carolyn [name] is clinicallly stable today.  MRI brain demonstrates stable findings with regression of treated R frontal metastasis.  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  We ask that Carolyn [name] return to clinic in 4 months following next brain MRI, or sooner as needed.  May treat headaches with PRN Tylenol as prior.  We appreciate the opportunity to participate in the care of Carolyn [name].   All questions were answered. The patient knows to call the clinic with any problems, questions or concerns. No barriers to learning were detected.  I have spent a total of 30 minutes of face-to-face and non-face-to-face time, excluding clinical staff time, preparing to see patient, ordering tests and/or medications, counseling the patient, and independently interpreting results and communicating results to the patient/family/caregiver    Henreitta Leber, MD Medical Director of Neuro-Oncology [facility] 08/23/22 9:30 AM

## 2022-09-01 DIAGNOSIS — E1165 Type 2 diabetes mellitus with hyperglycemia: Secondary | ICD-10-CM | POA: Diagnosis not present

## 2022-09-01 DIAGNOSIS — G8929 Other chronic pain: Secondary | ICD-10-CM | POA: Diagnosis not present

## 2022-09-01 DIAGNOSIS — M5442 Lumbago with sciatica, left side: Secondary | ICD-10-CM | POA: Diagnosis not present

## 2022-09-01 DIAGNOSIS — I10 Essential (primary) hypertension: Secondary | ICD-10-CM | POA: Diagnosis not present

## 2022-09-01 DIAGNOSIS — M62838 Other muscle spasm: Secondary | ICD-10-CM | POA: Diagnosis not present

## 2022-09-03 ENCOUNTER — Other Ambulatory Visit: Payer: Self-pay | Admitting: Internal Medicine

## 2022-09-03 DIAGNOSIS — C7931 Secondary malignant neoplasm of brain: Secondary | ICD-10-CM

## 2022-09-09 DIAGNOSIS — R768 Other specified abnormal immunological findings in serum: Secondary | ICD-10-CM | POA: Diagnosis not present

## 2022-09-09 DIAGNOSIS — M7989 Other specified soft tissue disorders: Secondary | ICD-10-CM | POA: Diagnosis not present

## 2022-09-09 DIAGNOSIS — M1990 Unspecified osteoarthritis, unspecified site: Secondary | ICD-10-CM | POA: Diagnosis not present

## 2022-09-09 DIAGNOSIS — Z79899 Other long term (current) drug therapy: Secondary | ICD-10-CM | POA: Diagnosis not present

## 2022-09-09 DIAGNOSIS — N1831 Chronic kidney disease, stage 3a: Secondary | ICD-10-CM | POA: Diagnosis not present

## 2022-09-09 DIAGNOSIS — M79642 Pain in left hand: Secondary | ICD-10-CM | POA: Diagnosis not present

## 2022-09-09 DIAGNOSIS — M359 Systemic involvement of connective tissue, unspecified: Secondary | ICD-10-CM | POA: Diagnosis not present

## 2022-09-09 DIAGNOSIS — C3492 Malignant neoplasm of unspecified part of left bronchus or lung: Secondary | ICD-10-CM | POA: Diagnosis not present

## 2022-09-16 DIAGNOSIS — M5451 Vertebrogenic low back pain: Secondary | ICD-10-CM | POA: Diagnosis not present

## 2022-09-18 ENCOUNTER — Telehealth: Payer: Self-pay

## 2022-09-18 NOTE — Telephone Encounter (Signed)
This nurse spoke with patient about port flush with lab appointment.  Patient states she noticed that she had an appointment on 8/13 and 8/16.  She thought the appointment on 8/13 was going to be canceled.  This nurse cancelled appointment for 8/13 and patient also asked for a morning appointment on 8/20.  This nurse offered 9am slot on 8/20 and patient is in agreement with date and time.  No other questions or concerns at this time.

## 2022-09-21 ENCOUNTER — Other Ambulatory Visit: Payer: Medicare Other

## 2022-09-23 ENCOUNTER — Telehealth: Payer: Self-pay

## 2022-09-23 NOTE — Telephone Encounter (Signed)
This nurse received a call from this patient stating that she tested positive for Covid on 8/14 and she is scheduled for labs and ct scan on 8/16.  She needs to reschedule.  This nurse provided the number to central scheduling and advised to schedule for a day that works best for you.  And the provider will be made aware.

## 2022-09-24 ENCOUNTER — Inpatient Hospital Stay: Payer: Medicare Other

## 2022-09-24 ENCOUNTER — Ambulatory Visit (HOSPITAL_COMMUNITY): Payer: Medicare Other

## 2022-09-28 ENCOUNTER — Ambulatory Visit: Payer: Medicare Other | Admitting: Internal Medicine

## 2022-10-04 ENCOUNTER — Ambulatory Visit: Admitting: Cardiology

## 2022-10-04 ENCOUNTER — Encounter: Payer: Self-pay | Admitting: Internal Medicine

## 2022-10-06 DIAGNOSIS — M5451 Vertebrogenic low back pain: Secondary | ICD-10-CM | POA: Diagnosis not present

## 2022-10-06 DIAGNOSIS — M1612 Unilateral primary osteoarthritis, left hip: Secondary | ICD-10-CM | POA: Diagnosis not present

## 2022-10-07 ENCOUNTER — Other Ambulatory Visit: Payer: Self-pay

## 2022-10-07 DIAGNOSIS — N1832 Chronic kidney disease, stage 3b: Secondary | ICD-10-CM

## 2022-10-07 DIAGNOSIS — R052 Subacute cough: Secondary | ICD-10-CM | POA: Diagnosis not present

## 2022-10-07 DIAGNOSIS — J3089 Other allergic rhinitis: Secondary | ICD-10-CM | POA: Diagnosis not present

## 2022-10-07 MED ORDER — EMPAGLIFLOZIN 25 MG PO TABS
25.0000 mg | ORAL_TABLET | Freq: Every day | ORAL | 0 refills | Status: DC
Start: 2022-10-07 — End: 2022-11-24

## 2022-10-12 ENCOUNTER — Ambulatory Visit (HOSPITAL_COMMUNITY)
Admission: RE | Admit: 2022-10-12 | Discharge: 2022-10-12 | Disposition: A | Discharge status: Home / Self Care | Payer: Medicare Other | Source: Ambulatory Visit | Attending: Internal Medicine | Admitting: Internal Medicine

## 2022-10-12 ENCOUNTER — Inpatient Hospital Stay: Payer: Medicare Other | Attending: Physician Assistant

## 2022-10-12 DIAGNOSIS — C3490 Malignant neoplasm of unspecified part of unspecified bronchus or lung: Secondary | ICD-10-CM | POA: Diagnosis not present

## 2022-10-12 DIAGNOSIS — C3412 Malignant neoplasm of upper lobe, left bronchus or lung: Secondary | ICD-10-CM | POA: Insufficient documentation

## 2022-10-12 DIAGNOSIS — C7931 Secondary malignant neoplasm of brain: Secondary | ICD-10-CM | POA: Insufficient documentation

## 2022-10-12 DIAGNOSIS — I3139 Other pericardial effusion (noninflammatory): Secondary | ICD-10-CM | POA: Diagnosis not present

## 2022-10-12 DIAGNOSIS — C3492 Malignant neoplasm of unspecified part of left bronchus or lung: Secondary | ICD-10-CM

## 2022-10-12 DIAGNOSIS — K449 Diaphragmatic hernia without obstruction or gangrene: Secondary | ICD-10-CM | POA: Diagnosis not present

## 2022-10-12 DIAGNOSIS — Z95828 Presence of other vascular implants and grafts: Secondary | ICD-10-CM

## 2022-10-12 LAB — CBC WITH DIFFERENTIAL (CANCER CENTER ONLY)
Abs Immature Granulocytes: 0.02 10*3/uL (ref 0.00–0.07)
Basophils Absolute: 0 10*3/uL (ref 0.0–0.1)
Basophils Relative: 1 %
Eosinophils Absolute: 0.2 10*3/uL (ref 0.0–0.5)
Eosinophils Relative: 3 %
HCT: 40.9 % (ref 36.0–46.0)
Hemoglobin: 12.5 g/dL (ref 12.0–15.0)
Immature Granulocytes: 0 %
Lymphocytes Relative: 20 %
Lymphs Abs: 1.6 10*3/uL (ref 0.7–4.0)
MCH: 24.4 pg — ABNORMAL LOW (ref 26.0–34.0)
MCHC: 30.6 g/dL (ref 30.0–36.0)
MCV: 79.9 fL — ABNORMAL LOW (ref 80.0–100.0)
Monocytes Absolute: 0.7 10*3/uL (ref 0.1–1.0)
Monocytes Relative: 8 %
Neutro Abs: 5.3 10*3/uL (ref 1.7–7.7)
Neutrophils Relative %: 68 %
Platelet Count: 277 10*3/uL (ref 150–400)
RBC: 5.12 MIL/uL — ABNORMAL HIGH (ref 3.87–5.11)
RDW: 16.6 % — ABNORMAL HIGH (ref 11.5–15.5)
WBC Count: 7.8 10*3/uL (ref 4.0–10.5)
nRBC: 0 % (ref 0.0–0.2)

## 2022-10-12 LAB — CMP (CANCER CENTER ONLY)
ALT: 12 U/L (ref 0–44)
AST: 17 U/L (ref 15–41)
Albumin: 4 g/dL (ref 3.5–5.0)
Alkaline Phosphatase: 87 U/L (ref 38–126)
Anion gap: 7 (ref 5–15)
BUN: 24 mg/dL — ABNORMAL HIGH (ref 8–23)
CO2: 27 mmol/L (ref 22–32)
Calcium: 9.3 mg/dL (ref 8.9–10.3)
Chloride: 105 mmol/L (ref 98–111)
Creatinine: 1.59 mg/dL — ABNORMAL HIGH (ref 0.44–1.00)
GFR, Estimated: 35 mL/min — ABNORMAL LOW (ref >60)
Glucose, Bld: 96 mg/dL (ref 70–99)
Potassium: 4.4 mmol/L (ref 3.5–5.1)
Sodium: 139 mmol/L (ref 135–145)
Total Bilirubin: 0.5 mg/dL (ref 0.3–1.2)
Total Protein: 7.2 g/dL (ref 6.5–8.1)

## 2022-10-12 MED ORDER — SODIUM CHLORIDE 0.9% FLUSH
10.0000 mL | INTRAVENOUS | Status: DC | PRN
  Administered 2022-10-12: 10 mL

## 2022-10-18 DIAGNOSIS — E785 Hyperlipidemia, unspecified: Secondary | ICD-10-CM | POA: Diagnosis not present

## 2022-10-18 DIAGNOSIS — I10 Essential (primary) hypertension: Secondary | ICD-10-CM | POA: Diagnosis not present

## 2022-10-18 DIAGNOSIS — R5383 Other fatigue: Secondary | ICD-10-CM | POA: Diagnosis not present

## 2022-10-18 DIAGNOSIS — E559 Vitamin D deficiency, unspecified: Secondary | ICD-10-CM | POA: Diagnosis not present

## 2022-10-18 DIAGNOSIS — E1165 Type 2 diabetes mellitus with hyperglycemia: Secondary | ICD-10-CM | POA: Diagnosis not present

## 2022-10-19 ENCOUNTER — Inpatient Hospital Stay (HOSPITAL_BASED_OUTPATIENT_CLINIC_OR_DEPARTMENT_OTHER): Payer: Medicare Other | Admitting: Internal Medicine

## 2022-10-19 ENCOUNTER — Ambulatory Visit: Admitting: Cardiology

## 2022-10-19 VITALS — BP 149/67 | HR 66 | Temp 98.3°F | Resp 13 | Wt 193.1 lb

## 2022-10-19 DIAGNOSIS — C3412 Malignant neoplasm of upper lobe, left bronchus or lung: Secondary | ICD-10-CM | POA: Diagnosis not present

## 2022-10-19 DIAGNOSIS — C3490 Malignant neoplasm of unspecified part of unspecified bronchus or lung: Secondary | ICD-10-CM | POA: Diagnosis not present

## 2022-10-19 DIAGNOSIS — C7931 Secondary malignant neoplasm of brain: Secondary | ICD-10-CM | POA: Diagnosis not present

## 2022-10-19 NOTE — Progress Notes (Signed)
[facility] [phone]   [phone]  OFFICE PROGRESS NOTE  Carolyn Sanger, FNP [address] [place] [address]  DIAGNOSIS: Stage IV (T1c, N1, M1c ) non-small cell lung cancer, adenocarcinoma diagnosed in July 2020 and presented with left upper lobe lung nodule in addition to right upper lobe lung nodule with left hilar lymphadenopathy as well as multiple brain metastasis.  Biomarker Findings Microsatellite status - MS-Stable Tumor Mutational Burden - 4 Muts/Mb Genomic Findings For a complete list of the genes assayed, please refer to the Appendix. ATM G204* KRAS G12C PDGFRA P122T SMO R199W 7 Disease relevant genes with no reportable alterations: ALK, BRAF, EGFR, ERBB2, MET, RET, ROS1  PDL 1 expression was 1%  PRIOR THERAPY:  1) SRS to multiple brain metastasis under the care of Dr. Basilio Cairo. 2) SRS to a 4 mm new brain metastasis under the care of Dr. Basilio Cairo and Dr. Wynetta Emery on 09/02/2020. 3) Systemic chemotherapy with carboplatin for AUC of 5, Alimta 500 mg/M2 and Keytruda 200 mg IV every 3 weeks.  First dose October 05, 2018.  Status post 65 cycles.  Starting from cycle #5 the patient will be on treatment with maintenance Alimta 500 mg/M2 and Keytruda 200 mg IV every 3 weeks.  She has been on treatment with single agent Keytruda starting from cycle #9 secondary to renal insufficiency.  Last dose was given June 08, 2022 after the patient has been on treatment for more than 5 years with no evidence of progression.  CURRENT THERAPY: Observation.  INTERVAL HISTORY: Carolyn [name] 67 y.o. female returns to the clinic today for 38-month follow-up visit.  The patient is feeling fine today with no concerning complaints.  She denied having any current chest pain, shortness of breath except with exertion with no cough or hemoptysis.  She has no nausea, vomiting, diarrhea or constipation.  She intentionally lost 2 pounds since her  last visit.  She denied having any fever or chills.  She had repeat CT scan of the chest, abdomen and pelvis performed recently and she is here for evaluation and discussion of her scan results.  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  MEDICATIONS:  Current Outpatient Medications  Medication Sig Dispense Refill   acetaminophen (TYLENOL) 500 MG tablet Take 1,000 mg by mouth every 8 (eight) hours as needed for mild pain, fever or headache.      amLODipine (NORVASC) 10 MG tablet Take 1 tablet (10 mg total) by mouth daily. 90 tablet 1   aspirin EC 81 MG tablet  Take 81 mg by mouth daily.     carvedilol (COREG) 25 MG tablet Take 1/2 (one-half) tablet by mouth twice daily 180 tablet 2   Cholecalciferol (VITAMIN D-3) 125 MCG (5000 UT) TABS Take 5,000 Units by mouth daily.     empagliflozin (JARDIANCE) 25 MG TABS tablet Take 1 tablet (25 mg total) by mouth daily before breakfast. 30 tablet 0   esomeprazole (NEXIUM) 20 MG capsule Take 20 mg by mouth daily with breakfast.     ezetimibe (ZETIA) 10 MG tablet Take 1 tablet (10 mg total) by mouth daily. 90 tablet 2   fexofenadine (ALLEGRA) 180 MG tablet Take 180 mg by mouth daily.     fluticasone (FLONASE) 50 MCG/ACT nasal spray Place 1 spray into  both nostrils daily as needed for allergies or rhinitis.     Lancets (ONETOUCH DELICA PLUS LANCET33G) MISC SMARTSIG:1 Topical 3 Times Daily     levocetirizine (XYZAL) 5 MG tablet Take 5 mg by mouth every evening.     lidocaine-prilocaine (EMLA) cream Apply to the Port-A-Cath site 30 to 60 minutes before chemotherapy. 30 g 2   Melatonin 1 MG CAPS Take 2 mg by mouth at bedtime as needed (for insomnia).     metFORMIN (GLUCOPHAGE) 500 MG tablet Take 500 mg by mouth daily with supper.     methocarbamol (ROBAXIN) 500 MG tablet Take 1 tablet (500 mg total) by mouth every 8 (eight) hours as needed for muscle spasms. (Patient not taking: Reported on 04/06/2022) 30 tablet 0   montelukast (SINGULAIR) 10 MG tablet Take 10 mg by mouth daily as needed (allergies).     nitroGLYCERIN (NITROSTAT) 0.4 MG SL tablet Place 1 tablet (0.4 mg total) under the tongue every 5 (five) minutes as needed for chest pain. (Patient not taking: Reported on 04/06/2022) 25 tablet 2   ONETOUCH ULTRA test strip USE 1 STRIP TO CHECK GLUCOSE THREE TIMES DAILY     Polyethyl Glycol-Propyl Glycol (SYSTANE) 0.4-0.3 % GEL ophthalmic gel Place 1 application. into both eyes every 6 (six) hours as needed (dry eyes).     telmisartan (MICARDIS) 40 MG tablet Take 40 mg by mouth daily.     No current facility-administered medications for this visit.    SURGICAL HISTORY:  Past Surgical History:  Procedure Laterality Date   ABDOMINAL HYSTERECTOMY     bone spur     CARDIAC CATHETERIZATION  06/13/2012   carpel tunnel surgery Left    COLONOSCOPY     9 years ago   CORONARY ANGIOPLASTY  06/13/2012   EYE SURGERY     cyst left eye    IR IMAGING GUIDED PORT INSERTION  10/10/2018   LEFT HEART CATHETERIZATION WITH CORONARY ANGIOGRAM N/A 06/13/2012   Procedure: LEFT HEART CATHETERIZATION WITH CORONARY ANGIOGRAM;  Surgeon: Pamella Pert, MD;  Location: [facility] CATH LAB;  Service: Cardiovascular;  Laterality: N/A;   NECK SURGERY     rotator cuff surgery Right  2015   SHOULDER ARTHROSCOPY WITH ROTATOR CUFF REPAIR AND SUBACROMIAL DECOMPRESSION Left 05/14/2021   Procedure: LEFT SHOULDER ARTHROSCOPY, SUBACROMIAL DECOMPRESSION, BICEPS TENODESIS, MINI OPEN ROTATOR CUFF REPAIR;  Surgeon: Cammy Copa, MD;  Location: [facility] OR;  Service: Orthopedics;  Laterality: Left;   VIDEO BRONCHOSCOPY WITH ENDOBRONCHIAL NAVIGATION N/A 09/06/2018   Procedure: VIDEO BRONCHOSCOPY WITH ENDOBRONCHIAL NAVIGATION, left upper lung;  Surgeon: Corliss Skains, MD;  Location: [facility] OR;  Service: Thoracic;  Laterality: N/A;   VIDEO BRONCHOSCOPY WITH ENDOBRONCHIAL ULTRASOUND Left 09/06/2018   Procedure: VIDEO BRONCHOSCOPY  WITH ENDOBRONCHIAL ULTRASOUND, left lung;  Surgeon: Corliss Skains, MD;  Location: [facility] OR;  Service: Thoracic;  Laterality: Left;   WISDOM TOOTH EXTRACTION      REVIEW OF SYSTEMS:  A comprehensive review of systems was negative.   PHYSICAL EXAMINATION: General appearance: alert, cooperative, and no distress Head: Normocephalic, without obvious abnormality, atraumatic Neck: no adenopathy, no JVD, supple, symmetrical, trachea midline, and thyroid not enlarged, symmetric, no tenderness/mass/nodules Lymph nodes: Cervical, supraclavicular, and axillary nodes normal. Resp: clear to auscultation bilaterally Back: symmetric, no curvature. ROM normal. No CVA tenderness. Cardio: regular rate and rhythm, S1, S2 normal, no murmur, click, rub or gallop GI: soft, non-tender; bowel sounds normal; no masses,  no organomegaly Extremities: extremities normal, atraumatic, no cyanosis or edema  ECOG PERFORMANCE STATUS: 1 - Symptomatic but completely ambulatory  Blood pressure (!) 149/67, pulse 66, temperature 98.3 F (36.8 C), temperature source Oral, resp. rate 13, weight 193 lb 1.6 oz (87.6 kg), SpO2 99%.  LABORATORY DATA: Lab Results  Component Value Date   WBC 7.8 10/12/2022   HGB 12.5 10/12/2022   HCT 40.9 10/12/2022   MCV 79.9 (L) 10/12/2022   PLT 277  10/12/2022      Chemistry      Component Value Date/Time   NA 139 10/12/2022 0803   NA 139 08/18/2020 0801   K 4.4 10/12/2022 0803   CL 105 10/12/2022 0803   CO2 27 10/12/2022 0803   BUN 24 (H) 10/12/2022 0803   BUN 16 08/18/2020 0801   CREATININE 1.59 (H) 10/12/2022 0803      Component Value Date/Time   CALCIUM 9.3 10/12/2022 0803   ALKPHOS 87 10/12/2022 0803   AST 17 10/12/2022 0803   ALT 12 10/12/2022 0803   BILITOT 0.5 10/12/2022 0803       RADIOGRAPHIC STUDIES: CT Chest Wo Contrast  Result Date: 10/18/2022 CLINICAL DATA:  Non-small cell lung cancer metastatic to the brain, completed immunotherapy. Restaging. * Tracking Code: BO * EXAM: CT CHEST, ABDOMEN AND PELVIS WITHOUT CONTRAST TECHNIQUE: Multidetector CT imaging of the chest, abdomen and pelvis was performed following the standard protocol without IV contrast. RADIATION DOSE REDUCTION: This exam was performed according to the departmental dose-optimization program which includes automated exposure control, adjustment of the mA and/or kV according to patient size and/or use of iterative reconstruction technique. COMPARISON:  06/25/2022 CT chest, abdomen and pelvis. FINDINGS: CT CHEST FINDINGS Cardiovascular: Normal heart size. Small anterior pericardial effusion, slightly increased. Right internal jugular Port-A-Cath terminates at the cavoatrial junction. Three-vessel coronary atherosclerosis. Atherosclerotic nonaneurysmal thoracic aorta. Normal caliber pulmonary arteries. Mediastinum/Nodes: No significant thyroid nodules. Unremarkable esophagus. No pathologically enlarged axillary, mediastinal or hilar lymph nodes, noting limited sensitivity for the detection of hilar adenopathy on this noncontrast study. Lungs/Pleura: No pneumothorax. No pleural effusion. No acute consolidative airspace disease. Irregular 0.8 x 0.6 cm left upper lobe pulmonary nodule with surrounding thin parenchymal band and mild distortion (series 4/image 49),  previously 0.8 x 0.7 cm, not appreciably changed, compatible with treated nodule. Indistinct 0.8 x 0.5 cm peripheral right upper lobe nodule (series 4/image 37), previously 0.8 x 0.5 cm, not appreciably changed. Stable 0.5 cm anterior right middle lobe ground-glass pulmonary nodule (series 4/image 75). No new significant pulmonary nodules. Musculoskeletal: No aggressive appearing focal osseous lesions. Partially visualized surgical hardware from ACDF. Mild thoracic spondylosis. CT ABDOMEN PELVIS FINDINGS Hepatobiliary: Normal liver with no liver mass. Normal gallbladder with no radiopaque cholelithiasis. No biliary ductal dilatation. Pancreas: Normal, with no mass or duct  dilation. Spleen: Normal size. No mass. Adrenals/Urinary Tract: Normal adrenals. No hydronephrosis. No renal stones. Subcentimeter hypodense medial lower left renal cortical lesion is too small to characterize and unchanged, for which no follow-up imaging is recommended. No additional contour deforming renal masses, noting chronic lobulated bilateral renal contours. Normal bladder. Stomach/Bowel: Small hiatal hernia. Otherwise normal nondistended stomach. Normal caliber small bowel with no small bowel wall thickening. Normal appendix. Mild left colonic diverticulosis with no large bowel wall thickening or significant pericolonic fat stranding. Vascular/Lymphatic: Atherosclerotic nonaneurysmal abdominal aorta. No pathologically enlarged lymph nodes in the abdomen or pelvis. Reproductive: Status post hysterectomy, with no abnormal findings at the vaginal cuff. No adnexal mass. Other: No pneumoperitoneum, ascites or focal fluid collection. Musculoskeletal: No aggressive appearing focal osseous lesions. Moderate degenerative disc disease in the lower lumbar spine with chronic 7 mm anterolisthesis at L4-5. IMPRESSION: 1. Stable CT examination with no evidence of recurrent metastatic disease in the chest, abdomen or pelvis. 2. Treated subcentimeter  bilateral upper lobe pulmonary nodules are stable. Tiny ground-glass right middle lobe pulmonary nodule is stable. 3. Small anterior pericardial effusion, slightly increased. 4. Chronic findings include: Three-vessel coronary atherosclerosis. Small hiatal hernia. Mild left colonic diverticulosis. Aortic Atherosclerosis (ICD10-I70.0). Electronically Signed   By: Delbert Phenix M.D.   On: 10/18/2022 14:05   CT Abdomen Pelvis Wo Contrast  Result Date: 10/18/2022 CLINICAL DATA:  Non-small cell lung cancer metastatic to the brain, completed immunotherapy. Restaging. * Tracking Code: BO * EXAM: CT CHEST, ABDOMEN AND PELVIS WITHOUT CONTRAST TECHNIQUE: Multidetector CT imaging of the chest, abdomen and pelvis was performed following the standard protocol without IV contrast. RADIATION DOSE REDUCTION: This exam was performed according to the departmental dose-optimization program which includes automated exposure control, adjustment of the mA and/or kV according to patient size and/or use of iterative reconstruction technique. COMPARISON:  06/25/2022 CT chest, abdomen and pelvis. FINDINGS: CT CHEST FINDINGS Cardiovascular: Normal heart size. Small anterior pericardial effusion, slightly increased. Right internal jugular Port-A-Cath terminates at the cavoatrial junction. Three-vessel coronary atherosclerosis. Atherosclerotic nonaneurysmal thoracic aorta. Normal caliber pulmonary arteries. Mediastinum/Nodes: No significant thyroid nodules. Unremarkable esophagus. No pathologically enlarged axillary, mediastinal or hilar lymph nodes, noting limited sensitivity for the detection of hilar adenopathy on this noncontrast study. Lungs/Pleura: No pneumothorax. No pleural effusion. No acute consolidative airspace disease. Irregular 0.8 x 0.6 cm left upper lobe pulmonary nodule with surrounding thin parenchymal band and mild distortion (series 4/image 49), previously 0.8 x 0.7 cm, not appreciably changed, compatible with treated  nodule. Indistinct 0.8 x 0.5 cm peripheral right upper lobe nodule (series 4/image 37), previously 0.8 x 0.5 cm, not appreciably changed. Stable 0.5 cm anterior right middle lobe ground-glass pulmonary nodule (series 4/image 75). No new significant pulmonary nodules. Musculoskeletal: No aggressive appearing focal osseous lesions. Partially visualized surgical hardware from ACDF. Mild thoracic spondylosis. CT ABDOMEN PELVIS FINDINGS Hepatobiliary: Normal liver with no liver mass. Normal gallbladder with no radiopaque cholelithiasis. No biliary ductal dilatation. Pancreas: Normal, with no mass or duct dilation. Spleen: Normal size. No mass. Adrenals/Urinary Tract: Normal adrenals. No hydronephrosis. No renal stones. Subcentimeter hypodense medial lower left renal cortical lesion is too small to characterize and unchanged, for which no follow-up imaging is recommended. No additional contour deforming renal masses, noting chronic lobulated bilateral renal contours. Normal bladder. Stomach/Bowel: Small hiatal hernia. Otherwise normal nondistended stomach. Normal caliber small bowel with no small bowel wall thickening. Normal appendix. Mild left colonic diverticulosis with no large bowel wall thickening or significant pericolonic fat stranding. Vascular/Lymphatic:  Atherosclerotic nonaneurysmal abdominal aorta. No pathologically enlarged lymph nodes in the abdomen or pelvis. Reproductive: Status post hysterectomy, with no abnormal findings at the vaginal cuff. No adnexal mass. Other: No pneumoperitoneum, ascites or focal fluid collection. Musculoskeletal: No aggressive appearing focal osseous lesions. Moderate degenerative disc disease in the lower lumbar spine with chronic 7 mm anterolisthesis at L4-5. IMPRESSION: 1. Stable CT examination with no evidence of recurrent metastatic disease in the chest, abdomen or pelvis. 2. Treated subcentimeter bilateral upper lobe pulmonary nodules are stable. Tiny ground-glass right  middle lobe pulmonary nodule is stable. 3. Small anterior pericardial effusion, slightly increased. 4. Chronic findings include: Three-vessel coronary atherosclerosis. Small hiatal hernia. Mild left colonic diverticulosis. Aortic Atherosclerosis (ICD10-I70.0). Electronically Signed   By: Delbert Phenix M.D.   On: 10/18/2022 14:05     ASSESSMENT AND PLAN: This is a very pleasant 67 years old African-American female with likely stage IV (T1c, N1, M1C) non-small cell lung cancer, adenocarcinoma with positive KRAS G12C mutation diagnosed in July 2020 and presented with left upper lobe lung nodule in addition to right upper lobe pulmonary nodule as well as left hilar adenopathy and metastatic lesion to the brain. Molecular studies showed no actionable mutation and PDL 1 expression was 1%. The patient underwent SBRT to the metastatic brain lesion under the care of Dr. Basilio Cairo. The patient is currently undergoing systemic chemotherapy with carboplatin for AUC of 5, Alimta 500 mg/M2 and Keytruda 200 mg IV every 3 weeks is status post 65 cycles.  Starting from cycle #5 she is on maintenance treatment with Alimta and Keytruda every 3 weeks with only Keytruda on the last few cycles because of the renal insufficiency. She is currently on observation and she is feeling fine with no concerning complaints. She had repeat CT scan of the chest, abdomen and pelvis performed recently.  I personally and independently reviewed the scan and discussed the result with the patient today.  Her scan showed no concerning findings for disease progression. I recommended for her to continue on observation with repeat CT scan of the chest, abdomen and pelvis without contrast in For the brain metastasis, she is followed by Dr. Barbaraann Cao and she is supposed to have another MRI of the brain in July 2024. For the renal insufficiency, she is followed by nephrology. She will have Port-A-Cath flush every 6 weeks. The patient was advised to call  immediately if she has any other concerning symptoms in the interval.  The patient voices understanding of current disease status and treatment options and is in agreement with the current care plan. All questions were answered. The patient knows to call the clinic with any problems, questions or concerns. We can certainly see the patient much sooner if necessary. The total time spent in the appointment was 30 minutes.  Disclaimer: This note was dictated with voice recognition software. Similar sounding words can inadvertently be transcribed and may not be corrected upon review.

## 2022-10-20 DIAGNOSIS — I87392 Chronic venous hypertension (idiopathic) with other complications of left lower extremity: Secondary | ICD-10-CM | POA: Diagnosis not present

## 2022-10-20 DIAGNOSIS — R252 Cramp and spasm: Secondary | ICD-10-CM | POA: Diagnosis not present

## 2022-10-20 DIAGNOSIS — M79662 Pain in left lower leg: Secondary | ICD-10-CM | POA: Diagnosis not present

## 2022-10-25 ENCOUNTER — Encounter: Payer: Self-pay | Admitting: Registered Nurse

## 2022-10-25 ENCOUNTER — Encounter: Payer: Self-pay | Admitting: Cardiology

## 2022-10-25 DIAGNOSIS — E559 Vitamin D deficiency, unspecified: Secondary | ICD-10-CM | POA: Diagnosis not present

## 2022-10-25 DIAGNOSIS — I2510 Atherosclerotic heart disease of native coronary artery without angina pectoris: Secondary | ICD-10-CM | POA: Diagnosis not present

## 2022-10-25 DIAGNOSIS — N183 Chronic kidney disease, stage 3 unspecified: Secondary | ICD-10-CM | POA: Diagnosis not present

## 2022-10-25 DIAGNOSIS — E7800 Pure hypercholesterolemia, unspecified: Secondary | ICD-10-CM | POA: Diagnosis not present

## 2022-10-25 DIAGNOSIS — E1122 Type 2 diabetes mellitus with diabetic chronic kidney disease: Secondary | ICD-10-CM | POA: Diagnosis not present

## 2022-10-25 DIAGNOSIS — I10 Essential (primary) hypertension: Secondary | ICD-10-CM | POA: Diagnosis not present

## 2022-11-02 ENCOUNTER — Telehealth: Payer: Self-pay

## 2022-11-02 ENCOUNTER — Other Ambulatory Visit: Payer: Medicare Other

## 2022-11-02 DIAGNOSIS — I25118 Atherosclerotic heart disease of native coronary artery with other forms of angina pectoris: Secondary | ICD-10-CM

## 2022-11-03 MED ORDER — PITAVASTATIN CALCIUM 4 MG PO TABS
1.0000 | ORAL_TABLET | Freq: Every day | ORAL | 3 refills | Status: DC
Start: 2022-11-03 — End: 2023-12-07

## 2022-11-18 DIAGNOSIS — Z1231 Encounter for screening mammogram for malignant neoplasm of breast: Secondary | ICD-10-CM | POA: Diagnosis not present

## 2022-11-22 NOTE — Progress Notes (Unsigned)
Cardiology Office Note:  .   Date:  11/23/2022  ID:  Murtis Sink, DOB 06-Aug-1955, MRN [id number] PCP: [name], FNP  [facility] Providers Cardiologist:  Yates Decamp, MD    History of Present Illness: .   Carolyn [name] is a 67 y.o. African-American female with coronary artery disease with angioplasty to the LAD and stenting to mid LAD in 2014, hypertension, hyperlipidemia, diabetes mellitus with stage 3b CKD and Stage IV non-small cell lung adenocarcinoma with mets to the brain diagnosed in July 2020 and presently on active Immunotherapy. Extremely positive attitude and has been doing well. She continues to follow with oncology. This is an annual visit.   Discussed the use of AI scribe software for clinical note transcription with the patient, who gave verbal consent to proceed.  History of Present Illness   The patient, with a history of cancer and diabetes, presents with weight gain of 18 pounds after a course of prednisone for an unspecified illness earlier in the summer. She reports being active and constantly moving throughout the day, but struggles with weight loss despite not eating excessively. She is considering starting a weight loss injection, but expresses concerns about potential side effects.  The patient's cancer is reportedly well-controlled, with no recent chemotherapy and follow-ups every three months. She also reports improvement in her diabetes management, but is considering adding a weight loss injection to her regimen.  The patient is also active, frequently walking while her husband works out at [employer]. She reports no recent chest pain or shortness of breath.       Review of Systems  Cardiovascular:  Negative for chest pain, dyspnea on exertion and leg swelling.    Risk Assessment/Calculations:              Lab Results  Component Value Date   CHOL 132 08/18/2020   HDL 50 08/18/2020   LDLCALC 66 08/18/2020   LDLDIRECT 56 08/18/2020    TRIG 80 08/18/2020   Lab Results  Component Value Date   NA 139 10/12/2022   K 4.4 10/12/2022   CO2 27 10/12/2022   GLUCOSE 96 10/12/2022   BUN 24 (H) 10/12/2022   CREATININE 1.59 (H) 10/12/2022   CALCIUM 9.3 10/12/2022   EGFR 34 (L) 08/18/2020   GFRNONAA 35 (L) 10/12/2022   Lab Results  Component Value Date   WBC 7.8 10/12/2022   HGB 12.5 10/12/2022   HCT 40.9 10/12/2022   MCV 79.9 (L) 10/12/2022   PLT 277 10/12/2022    External Labs:  Labs 10/18/2022:  A1C 6.600 10/18/2022 TSH 2.640 10/18/2022  Total cholesterol 129, triglycerides 50, HDL 60, LDL 58.  Physical Exam:   VS:  BP 132/78 (BP Location: Left Arm, Patient Position: Sitting, Cuff Size: Large)   Pulse 63   Resp 16   Ht 5\' 1"  (1.549 m)   Wt 187 lb 12.8 oz (85.2 kg)   SpO2 93%   BMI 35.48 kg/m    Wt Readings from Last 3 Encounters:  11/23/22 187 lb 12.8 oz (85.2 kg)  10/19/22 193 lb 1.6 oz (87.6 kg)  08/23/22 195 lb 3.2 oz (88.5 kg)     Physical Exam Constitutional:      Appearance: She is obese.  Neck:     Vascular: No carotid bruit or JVD.  Cardiovascular:     Rate and Rhythm: Normal rate and regular rhythm.     Pulses: Intact distal pulses.     Heart sounds:  Normal heart sounds. No murmur heard.    No gallop.  Pulmonary:     Effort: Pulmonary effort is normal.     Breath sounds: Normal breath sounds.  Abdominal:     General: Bowel sounds are normal.     Palpations: Abdomen is soft.  Musculoskeletal:     Right lower leg: No edema.     Left lower leg: No edema.     Studies Reviewed: Marland Kitchen    EKG:    EKG Interpretation Date/Time:  Tuesday November 23 2022 10:00:04 EDT Ventricular Rate:  59 PR Interval:  150 QRS Duration:  78 QT Interval:  408 QTC Calculation: 403 R Axis:   29  Text Interpretation: EKG 11/23/2022: Normal sinus rhythm at rate of 59 bpm, normal axis, nonspecific T wave flattening.  Low voltage complexes.  Compared to 12/18/2021, no significant change. Confirmed by Delrae Rend [phone]) on 11/23/2022 10:29:25 AM    EKG 10/02/2021: Normal sinus rhythm at the rate of 64 bpm, left atrial enlargement, normal axis, poor R wave progression, probably normal variant.   Cardiac catheterization on 06/13/2012: High-grade 99% mid LAD stenosis, successful PTCA and stenting with 4.0 x 24 mm Veriflex non drug eluting stent.   Exercise nuclear stress test 04/07/2020: Normal perfusion.  Echocardiogram 04/09/2020: Normal LV systolic function, grade 1 diastolic dysfunction.  ASSESSMENT AND PLAN: .      ICD-10-CM   1. Atherosclerosis of native coronary artery of native heart with stable angina pectoris (HCC)  I25.118 EKG 12-Lead    2. Primary hypertension  I10     3. Hypercholesteremia  E78.00     4. Stage 3b chronic kidney disease (HCC)  N18.32       Assessment and Plan  Hypertension and Heart Disease No recurrence of chest pain. Blood pressure controlled with Amlodipine, Coreg, and Telmisartan. -Continue Amlodipine, Coreg, and Telmisartan.  Weight Gain Recent weight gain of 18 pounds, likely secondary to prednisone use. Patient is active but struggling with weight loss. Discussed potential use of weight loss injection. -Consider weight loss injection, taking into account patient's concerns about side effects.  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 Discussed potential benefits of weight loss injection, GLP-1 agonist for diabetes management. -Continue Jardiance. Consider starting GLP agonist from cardiac standpoint as well.  She is presently on ARB tolerating this well and stage IIIb chronic kidney disease has remained stable.  Hyperlipidemia Patient is on Livalo and Ezetimibe for cholesterol management.  I reviewed her external labs, lipids under excellent control. -Continue Livalo and Ezetimibe.  Follow-up in 1 year.  Signed,  Yates Decamp, MD, [facility] 11/23/2022, 10:44 AM [facility] [address] [place], [place] [address] Phone: [phone]. Fax:  [phone]

## 2022-11-23 ENCOUNTER — Encounter: Payer: Self-pay | Admitting: Cardiology

## 2022-11-23 ENCOUNTER — Ambulatory Visit: Payer: Medicare Other | Attending: Cardiology | Admitting: Cardiology

## 2022-11-23 VITALS — BP 132/78 | HR 63 | Resp 16 | Ht 61.0 in | Wt 187.8 lb

## 2022-11-23 DIAGNOSIS — E7800 Pure hypercholesterolemia, unspecified: Secondary | ICD-10-CM | POA: Diagnosis not present

## 2022-11-23 DIAGNOSIS — I10 Essential (primary) hypertension: Secondary | ICD-10-CM | POA: Diagnosis not present

## 2022-11-23 DIAGNOSIS — N1832 Chronic kidney disease, stage 3b: Secondary | ICD-10-CM | POA: Diagnosis not present

## 2022-11-23 DIAGNOSIS — I25118 Atherosclerotic heart disease of native coronary artery with other forms of angina pectoris: Secondary | ICD-10-CM

## 2022-11-23 NOTE — Patient Instructions (Signed)
Medication Instructions:  Your physician recommends that you continue on your current medications as directed. Please refer to the Current Medication list given to you today.  *If you need a refill on your cardiac medications before your next appointment, please call your pharmacy*  Lab Work: None ordered today.  Testing/Procedures: None ordered today.  Follow-Up: At [facility], you and your health needs are our priority.  As part of our continuing mission to provide you with exceptional heart care, we have created designated Provider Care Teams.  These Care Teams include your primary Cardiologist (physician) and Advanced Practice Providers (APPs -  Physician Assistants and Nurse Practitioners) who all work together to provide you with the care you need, when you need it.  Your next appointment:   1 year(s) -- You will receive a letter in May 2025 reminding you to call and schedule your follow-up appointment.  The format for your next appointment:   In Person  Provider:   Yates Decamp, MD {

## 2022-11-24 ENCOUNTER — Other Ambulatory Visit: Payer: Self-pay

## 2022-11-24 ENCOUNTER — Telehealth: Payer: Self-pay | Admitting: Cardiology

## 2022-11-24 DIAGNOSIS — E1122 Type 2 diabetes mellitus with diabetic chronic kidney disease: Secondary | ICD-10-CM

## 2022-11-24 DIAGNOSIS — E7800 Pure hypercholesterolemia, unspecified: Secondary | ICD-10-CM

## 2022-11-24 MED ORDER — EMPAGLIFLOZIN 25 MG PO TABS: 25.0000 mg | ORAL_TABLET | Freq: Every day | ORAL | 3 refills | Status: DC

## 2022-11-24 MED ORDER — EZETIMIBE 10 MG PO TABS: 10.0000 mg | ORAL_TABLET | Freq: Every day | ORAL | 3 refills | Status: DC

## 2022-11-24 NOTE — Telephone Encounter (Signed)
*  STAT* If patient is at the pharmacy, call can be transferred to refill team.   1. Which medications need to be refilled? (please list name of each medication and dose if known) empagliflozin (JARDIANCE) 25 MG TABS tablet  ezetimibe (ZETIA) 10 MG tablet   2. Which pharmacy/location (including street and city if local pharmacy) is medication to be sent to? [facility] - [place], [place] - [address]   3. Do they need a 30 day or 90 day supply? 90

## 2022-11-25 DIAGNOSIS — E559 Vitamin D deficiency, unspecified: Secondary | ICD-10-CM | POA: Diagnosis not present

## 2022-11-25 DIAGNOSIS — N183 Chronic kidney disease, stage 3 unspecified: Secondary | ICD-10-CM | POA: Diagnosis not present

## 2022-11-25 DIAGNOSIS — E669 Obesity, unspecified: Secondary | ICD-10-CM | POA: Diagnosis not present

## 2022-11-25 DIAGNOSIS — E1122 Type 2 diabetes mellitus with diabetic chronic kidney disease: Secondary | ICD-10-CM | POA: Diagnosis not present

## 2022-11-25 DIAGNOSIS — I10 Essential (primary) hypertension: Secondary | ICD-10-CM | POA: Diagnosis not present

## 2022-11-25 DIAGNOSIS — I2510 Atherosclerotic heart disease of native coronary artery without angina pectoris: Secondary | ICD-10-CM | POA: Diagnosis not present

## 2022-11-25 DIAGNOSIS — E785 Hyperlipidemia, unspecified: Secondary | ICD-10-CM | POA: Diagnosis not present

## 2022-11-30 ENCOUNTER — Inpatient Hospital Stay: Payer: Medicare Other | Attending: Physician Assistant

## 2022-11-30 DIAGNOSIS — C7931 Secondary malignant neoplasm of brain: Secondary | ICD-10-CM | POA: Insufficient documentation

## 2022-11-30 DIAGNOSIS — C3412 Malignant neoplasm of upper lobe, left bronchus or lung: Secondary | ICD-10-CM | POA: Diagnosis not present

## 2022-11-30 DIAGNOSIS — Z452 Encounter for adjustment and management of vascular access device: Secondary | ICD-10-CM | POA: Diagnosis not present

## 2022-11-30 DIAGNOSIS — Z95828 Presence of other vascular implants and grafts: Secondary | ICD-10-CM

## 2022-11-30 MED ORDER — SODIUM CHLORIDE 0.9% FLUSH: 10.0000 mL | INTRAVENOUS | Status: DC | PRN

## 2022-11-30 MED ORDER — HEPARIN SOD (PORK) LOCK FLUSH 100 UNIT/ML IV SOLN: 500.0000 [IU] | Freq: Once | INTRAVENOUS | Status: DC | PRN

## 2022-12-16 ENCOUNTER — Ambulatory Visit
Admission: RE | Admit: 2022-12-16 | Discharge: 2022-12-16 | Disposition: A | Discharge status: Home / Self Care | Payer: Medicare Other | Source: Ambulatory Visit | Attending: Internal Medicine | Admitting: Internal Medicine

## 2022-12-16 DIAGNOSIS — G9389 Other specified disorders of brain: Secondary | ICD-10-CM | POA: Diagnosis not present

## 2022-12-16 DIAGNOSIS — C7931 Secondary malignant neoplasm of brain: Secondary | ICD-10-CM

## 2022-12-16 DIAGNOSIS — C3490 Malignant neoplasm of unspecified part of unspecified bronchus or lung: Secondary | ICD-10-CM | POA: Diagnosis not present

## 2022-12-16 MED ORDER — GADOPICLENOL 0.5 MMOL/ML IV SOLN
9.0000 mL | Freq: Once | INTRAVENOUS | Status: AC | PRN
Start: 2022-12-16 — End: 2022-12-16
  Administered 2022-12-16: 9 mL via INTRAVENOUS

## 2022-12-17 ENCOUNTER — Other Ambulatory Visit: Payer: Self-pay | Admitting: Radiation Therapy

## 2022-12-20 ENCOUNTER — Inpatient Hospital Stay: Payer: Medicare Other | Attending: Physician Assistant | Admitting: Internal Medicine

## 2022-12-20 ENCOUNTER — Inpatient Hospital Stay: Payer: Medicare Other

## 2022-12-20 VITALS — BP 138/62 | HR 68 | Temp 98.5°F | Resp 16 | Wt 182.7 lb

## 2022-12-20 DIAGNOSIS — C7931 Secondary malignant neoplasm of brain: Secondary | ICD-10-CM | POA: Diagnosis not present

## 2022-12-20 DIAGNOSIS — R519 Headache, unspecified: Secondary | ICD-10-CM | POA: Insufficient documentation

## 2022-12-20 DIAGNOSIS — C3412 Malignant neoplasm of upper lobe, left bronchus or lung: Secondary | ICD-10-CM | POA: Diagnosis not present

## 2022-12-20 NOTE — Progress Notes (Signed)
[facility] [address]  [place], [place] [address] [phone]   Interval Evaluation  Date of Service: 12/20/22 Patient Name: Carolyn [name] Patient MRN: [id number] Patient DOB: 1955/09/13 Provider: Henreitta Leber, MD  Identifying Statement:  Carolyn [name] is a 67 y.o. female with Malignant neoplasm metastatic to brain [facility]) [C79.31]   Primary Cancer:  Oncologic History: Oncology History  Adenocarcinoma, lung (HCC)  09/12/2018 Initial Diagnosis   Adenocarcinoma, lung (HCC)   10/05/2018 - 06/08/2022 Chemotherapy   Patient is on Treatment Plan : LUNG Carboplatin (5) + Pemetrexed (500) + Pembrolizumab (200) D1 q21d Induction x 4 cycles / Maintenance Pemetrexed (500) + Pembrolizumab (200) D1 q21d     05/14/2020 Cancer Staging   Staging form: Lung, AJCC 8th Edition - Clinical: Stage IVB (cT1c, cN1, cM1c) - Signed by Si Gaul, MD on 05/14/2020    CNS Oncologic History: 09/22/18: Completes SRS to 9 sub-cm metastases [facility]) 09/02/20: SRS x1 [facility]) 12/15/20: SRS R frontal (SS)  Interval History:  Carolyn [name] presents today for follow up after recent MRI brain.  No novel complaints today. No severe headaches reported. Remains phsyically and mentally active.  Now on observation with Dr. Arbutus Ped.  Medications: Current Outpatient Medications on File Prior to Visit  Medication Sig Dispense Refill   acetaminophen (TYLENOL) 500 MG tablet Take 1,000 mg by mouth every 8 (eight) hours as needed for mild pain, fever or headache.      amLODipine (NORVASC) 10 MG tablet Take 1 tablet (10 mg total) by mouth daily. 90 tablet 1   aspirin EC 81 MG tablet Take 81 mg by mouth daily.     carvedilol (COREG) 25 MG tablet Take 1/2 (one-half) tablet by mouth twice daily 180 tablet 2   Cholecalciferol (VITAMIN D-3) 125 MCG (5000 UT) TABS Take 5,000 Units by mouth daily.     empagliflozin (JARDIANCE) 25 MG TABS tablet Take 1 tablet (25 mg  total) by mouth daily before breakfast. 90 tablet 3   esomeprazole (NEXIUM) 20 MG capsule Take 20 mg by mouth daily with breakfast.     ezetimibe (ZETIA) 10 MG tablet Take 1 tablet (10 mg total) by mouth daily. 90 tablet 3   fexofenadine (ALLEGRA) 180 MG tablet Take 180 mg by mouth daily.     fluticasone (FLONASE) 50 MCG/ACT nasal spray Place 1 spray into both nostrils daily as needed for allergies or rhinitis.     Lancets (ONETOUCH DELICA PLUS LANCET33G) MISC SMARTSIG:1 Topical 3 Times Daily     levocetirizine (XYZAL) 5 MG tablet Take 5 mg by mouth every evening.     lidocaine-prilocaine (EMLA) cream Apply to the Port-A-Cath site 30 to 60 minutes before chemotherapy. 30 g 2   Melatonin 1 MG CAPS Take 2 mg by mouth at bedtime as needed (for insomnia).     metFORMIN (GLUCOPHAGE) 500 MG tablet Take 500 mg by mouth daily with supper.     methocarbamol (ROBAXIN) 500 MG tablet Take 1 tablet (500 mg total) by mouth every 8 (eight) hours as needed for muscle spasms. 30 tablet 0   montelukast (SINGULAIR) 10 MG tablet Take 10 mg by mouth daily as needed (allergies).     nitroGLYCERIN (NITROSTAT) 0.4 MG SL tablet Place 1 tablet (0.4 mg total) under the tongue every 5 (five) minutes as needed for chest pain. 25 tablet 2   ONETOUCH ULTRA test strip USE 1 STRIP TO CHECK GLUCOSE THREE TIMES DAILY  Pitavastatin Calcium (LIVALO) 4 MG TABS Take 1 tablet (4 mg total) by mouth daily. 90 tablet 3   Polyethyl Glycol-Propyl Glycol (SYSTANE) 0.4-0.3 % GEL ophthalmic gel Place 1 application. into both eyes every 6 (six) hours as needed (dry eyes).     Semaglutide,0.25 or 0.5MG /DOS, (OZEMPIC, 0.25 OR 0.5 MG/DOSE,) 2 MG/1.5ML SOPN Inject 0.25 mg into the skin once a week.     telmisartan (MICARDIS) 40 MG tablet Take 40 mg by mouth daily.     No current facility-administered medications on file prior to visit.    Allergies:  Allergies  Allergen Reactions   Atorvastatin Other (See Comments)    Severe myalgia    Crestor [Rosuvastatin] Other (See Comments)    Severe myalgias   Lisinopril Swelling    Lips and face swelled up.   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   Hypertension    nscl ca dx'd 08/07/18   Persistent headaches    h/o   Pneumonia    hx of PNA   Shortness of breath    Vaginal dryness    h/o   Past Surgical History:  Past Surgical History:  Procedure Laterality Date   ABDOMINAL HYSTERECTOMY     bone spur     CARDIAC CATHETERIZATION  06/13/2012   carpel tunnel surgery Left    COLONOSCOPY     9 years ago   CORONARY ANGIOPLASTY  06/13/2012   EYE SURGERY     cyst left eye    IR IMAGING GUIDED PORT INSERTION  10/10/2018   LEFT HEART CATHETERIZATION WITH CORONARY ANGIOGRAM N/A 06/13/2012   Procedure: LEFT HEART CATHETERIZATION WITH CORONARY ANGIOGRAM;  Surgeon: Pamella Pert, MD;  Location[facility] C[facility] LAB;  Service: Cardiovascular;  Laterality: N/A;   NECK SURGERY     rotator cuff surgery Right 2015   SHOULDER ARTHROSCOPY WITH ROTATOR CUFF REPAIR AND SUBACROMIAL DECOMPRESSION Left 05/14/2021   Procedure: LEFT SHOULDER ARTHROSCOPY, SUBACROMIAL DECOMPRESSION, BICEPS TENODESIS, MINI OPEN ROTATOR CUFF REPAIR;  Surgeon: Cammy Copa, MD;  Location: [facility] OR;  Service: Orthopedics;  Laterality: Left;   VIDEO BRONCHOSCOPY WITH ENDOBRONCHIAL NAVIGATION N/A 09/06/2018   Procedure: VIDEO BRONCHOSCOPY WITH ENDOBRONCHIAL NAVIGATION, left upper lung;   Surgeon: Corliss Skains, MD;  Location: [facility] OR;  Service: Thoracic;  Laterality: N/A;   VIDEO BRONCHOSCOPY WITH ENDOBRONCHIAL ULTRASOUND Left 09/06/2018   Procedure: VIDEO BRONCHOSCOPY WITH ENDOBRONCHIAL ULTRASOUND, left lung;  Surgeon: Corliss Skains, MD;  Location: [facility] OR;  Service: Thoracic;  Laterality: Left;   WISDOM TOOTH EXTRACTION     Social History:  Social History   Socioeconomic History   Marital status: Married    Spouse name: Not on file   Number of children: 3   Years of education: Not on file   Highest education level: Not  on file  Occupational History   Not on file  Tobacco Use   Smoking status: Former    Current packs/day: 0.00    Average packs/day: 0.5 packs/day for 30.0 years (15.0 ttl pk-yrs)    Types: Cigarettes    Start date: 02/08/1969    Quit date: 02/09/1999    Years since quitting: 23.8   Smokeless tobacco: Never  Vaping Use   Vaping status: Never Used  Substance and Sexual Activity   Alcohol use: Not Currently   Drug use: Not Currently   Sexual activity: Yes    Birth control/protection: Post-menopausal  Other Topics Concern   Not on file  Social History Narrative   Not on file   Social Determinants of Health   Financial Resource Strain: Not on file  Food Insecurity: Not on file  Transportation Needs: No Transportation Needs (08/29/2018)   PRAPARE - Administrator, Civil Service (Medical): No    Lack of Transportation (Non-Medical): No  Physical Activity: Not on file  Stress: Not on file  Social Connections: Not on file  Intimate Partner Violence: Not At Risk (08/29/2018)   Humiliation, Afraid, Rape, and Kick questionnaire    Fear of Current or Ex-Partner: No    Emotionally Abused: No    Physically Abused: No    Sexually Abused: No   Family History:  Family History  Problem Relation Age of Onset   Breast cancer Other    Diabetes Mother    Glaucoma Mother    Hypertension Mother    Hypertension Father    Asthma  Father    Diabetes Sister    Diabetes Brother    Hypertension Sister     Review of Systems: Constitutional: Doesn't report fevers, chills or abnormal weight loss Eyes: Doesn't report blurriness of vision Ears, nose, mouth, throat, and face: Doesn't report sore throat Respiratory: Doesn't report cough, dyspnea or wheezes Cardiovascular: Doesn't report palpitation, chest discomfort  Gastrointestinal:  Doesn't report nausea, constipation, diarrhea GU: Doesn't report incontinence Skin: Doesn't report skin rashes Neurological: Per HPI Musculoskeletal: Doesn't report joint pain Behavioral/Psych: Doesn't report anxiety  Physical Exam: Vitals:   12/20/22 1003  BP: 138/62  Pulse: 68  Resp: 16  Temp: 98.5 F (36.9 C)  SpO2: 97%    KPS: 90. General: Alert, cooperative, pleasant, in no acute distress Head: Normal EENT: No conjunctival injection or scleral icterus.  Lungs: Resp effort normal Cardiac: Regular rate Abdomen: Non-distended abdomen Skin: No rashes cyanosis or petechiae. Extremities: No clubbing or edema  Neurologic Exam: Mental Status: Awake, alert, attentive to examiner. Oriented to self and environment. Language is fluent with intact comprehension.  Cranial Nerves: Visual acuity is grossly normal. Visual fields are full. Extra-ocular movements intact. No ptosis. Face is symmetric Motor: Tone and bulk are normal. Power is full in both arms and legs. Reflexes are symmetric, no pathologic reflexes present.  Sensory: Intact to light touch Gait: Normal.   Labs: I have reviewed the data as listed    Component Value Date/Time   NA 139 10/12/2022 0803   NA 139 08/18/2020 0801   K 4.4 10/12/2022 0803   CL 105 10/12/2022 0803   CO2 27 10/12/2022 0803   GLUCOSE 96 10/12/2022 0803   BUN 24 (H) 10/12/2022 0803   BUN 16 08/18/2020 0801   CREATININE 1.59 (H) 10/12/2022 0803   CALCIUM 9.3 10/12/2022 0803   PROT 7.2 10/12/2022 0803   ALBUMIN 4.0 10/12/2022 0803   AST  17 10/12/2022 0803  ALT 12 10/12/2022 0803   ALKPHOS 87 10/12/2022 0803   BILITOT 0.5 10/12/2022 0803   GFRNONAA 35 (L) 10/12/2022 0803   GFRAA 37 (L) 11/12/2019 0917   GFRAA 37 (L) 11/08/2019 0900   Lab Results  Component Value Date   WBC 7.8 10/12/2022   NEUTROABS 5.3 10/12/2022   HGB 12.5 10/12/2022   HCT 40.9 10/12/2022   MCV 79.9 (L) 10/12/2022   PLT 277 10/12/2022    Imaging:  MR BRAIN W WO CONTRAST  Result Date: 12/16/2022 CLINICAL DATA:  Non-small cell lung cancer. Brain/CNS neoplasm, assess treatment response. Status post chemotherapy. EXAM: MRI HEAD WITHOUT AND WITH CONTRAST TECHNIQUE: Multiplanar, multiecho pulse sequences of the brain and surrounding structures were obtained without and with intravenous contrast. CONTRAST:  9 mL Vueway COMPARISON:  MR head without and with contrast 08/19/2022 and 04/22/2022. FINDINGS: Brain: The peripherally enhancing lesion in the right superior frontal gyrus continues to decrease in size. The lesion now measures 11 x 8 x 11 mm, decreased from previous measurements of 15 x 12 x 13 mm. Punctate lesions adjacent to the atrium of the left lateral ventricle on image 92 series 14 and in the left cerebellum on image 37 are stable. Focal enhancement at the left temporal tip on image 74 is slightly more prominent than on the prior exam. A new focus scratched at A new focus of enhancement is present in the left occipital lobe on image 92 of series 14. T2 and FLAIR hyperintensity in the anterior right frontal lobe is further diminished. Focal FLAIR hyperintensity in the left occipital lobe has increased. No acute infarct or hemorrhage is present. Deep brain nuclei are within normal limits. The ventricles are of normal size. No significant extraaxial fluid collection is present. Encephalomalacia in the posteromedial left cerebellum is stable. The internal auditory canals are within normal limits. Midline structures are within normal limits. Vascular: Flow is  present in the major intracranial arteries. Skull and upper cervical spine: Postoperative changes are present in the upper cervical spine. Craniocervical junction is normal. Marrow signal is normal. Sinuses/Orbits: The globes and orbits are within normal limits. The paranasal sinuses and mastoid air cells are clear. IMPRESSION: 1. Continued decrease in size of the peripherally enhancing lesion in the right superior frontal gyrus. 2. Stable punctate lesions adjacent to the atrium of the left lateral ventricle and in the left cerebellum. 3. Focal enhancement at the left temporal tip is slightly more prominent than on the prior exam. 4. New focus of enhancement in the left occipital lobe. Associated T2 and FLAIR hyperintensity is noted. This is concerning for a new metastasis. Recommend attention to this area on routine follow-up exam. 5. Stable encephalomalacia in the posteromedial left cerebellum. Electronically Signed   By: Marin Roberts M.D.   On: 12/16/2022 16:56     [facility] Clinician Interpretation: I have personally reviewed the radiological images as listed.  My interpretation, in the context of the patient's clinical presentation, is likely treatment effect pending official read   Assessment/Plan Malignant neoplasm metastatic to brain [facility]) [C79.31]  Carolyn [name] is clinicallly stable today.  MRI brain demonstrates small new focus of enhancement in left occipital lobe; this overlaps with previously treated lesion and is likely reactive in nature.  We ask that Carolyn [name] return to clinic in 6 months following next brain MRI, or sooner as needed.  May treat headaches with PRN Tylenol as prior.  We appreciate the opportunity to participate in the care of [facility]  [place].   All questions were answered. The patient knows to call the clinic with any problems, questions or concerns. No barriers to learning were detected.  I have spent a total of 30 minutes of face-to-face and  non-face-to-face time, excluding clinical staff time, preparing to see patient, ordering tests and/or medications, counseling the patient, and independently interpreting results and communicating results to the patient/family/caregiver    Henreitta Leber, MD Medical Director of Neuro-Oncology [facility] 12/20/22 10:24 AM

## 2022-12-21 DIAGNOSIS — I129 Hypertensive chronic kidney disease with stage 1 through stage 4 chronic kidney disease, or unspecified chronic kidney disease: Secondary | ICD-10-CM | POA: Diagnosis not present

## 2022-12-21 DIAGNOSIS — C3492 Malignant neoplasm of unspecified part of left bronchus or lung: Secondary | ICD-10-CM | POA: Diagnosis not present

## 2022-12-21 DIAGNOSIS — E1122 Type 2 diabetes mellitus with diabetic chronic kidney disease: Secondary | ICD-10-CM | POA: Diagnosis not present

## 2022-12-21 DIAGNOSIS — N1832 Chronic kidney disease, stage 3b: Secondary | ICD-10-CM | POA: Diagnosis not present

## 2022-12-28 ENCOUNTER — Other Ambulatory Visit: Payer: Self-pay | Admitting: Cardiology

## 2022-12-28 DIAGNOSIS — I10 Essential (primary) hypertension: Secondary | ICD-10-CM

## 2023-01-11 ENCOUNTER — Encounter (HOSPITAL_COMMUNITY): Payer: Self-pay

## 2023-01-11 ENCOUNTER — Inpatient Hospital Stay: Payer: Medicare Other | Attending: Physician Assistant

## 2023-01-11 ENCOUNTER — Ambulatory Visit (HOSPITAL_COMMUNITY)
Admission: RE | Admit: 2023-01-11 | Discharge: 2023-01-11 | Disposition: A | Discharge status: Home / Self Care | Payer: Medicare Other | Source: Ambulatory Visit | Attending: Internal Medicine | Admitting: Internal Medicine

## 2023-01-11 ENCOUNTER — Telehealth: Payer: Self-pay | Admitting: Medical Oncology

## 2023-01-11 DIAGNOSIS — J432 Centrilobular emphysema: Secondary | ICD-10-CM | POA: Diagnosis not present

## 2023-01-11 DIAGNOSIS — N189 Chronic kidney disease, unspecified: Secondary | ICD-10-CM | POA: Diagnosis not present

## 2023-01-11 DIAGNOSIS — C3490 Malignant neoplasm of unspecified part of unspecified bronchus or lung: Secondary | ICD-10-CM | POA: Insufficient documentation

## 2023-01-11 DIAGNOSIS — Z95828 Presence of other vascular implants and grafts: Secondary | ICD-10-CM

## 2023-01-11 DIAGNOSIS — I700 Atherosclerosis of aorta: Secondary | ICD-10-CM | POA: Diagnosis not present

## 2023-01-11 DIAGNOSIS — C3412 Malignant neoplasm of upper lobe, left bronchus or lung: Secondary | ICD-10-CM | POA: Insufficient documentation

## 2023-01-11 DIAGNOSIS — C7931 Secondary malignant neoplasm of brain: Secondary | ICD-10-CM | POA: Diagnosis not present

## 2023-01-11 LAB — CMP (CANCER CENTER ONLY)
ALT: 11 U/L (ref 0–44)
AST: 17 U/L (ref 15–41)
Albumin: 3.8 g/dL (ref 3.5–5.0)
Alkaline Phosphatase: 70 U/L (ref 38–126)
Anion gap: 8 (ref 5–15)
BUN: 15 mg/dL (ref 8–23)
CO2: 26 mmol/L (ref 22–32)
Calcium: 9 mg/dL (ref 8.9–10.3)
Chloride: 107 mmol/L (ref 98–111)
Creatinine: 1.45 mg/dL — ABNORMAL HIGH (ref 0.44–1.00)
GFR, Estimated: 39 mL/min — ABNORMAL LOW (ref >60)
Glucose, Bld: 145 mg/dL — ABNORMAL HIGH (ref 70–99)
Potassium: 3.9 mmol/L (ref 3.5–5.1)
Sodium: 141 mmol/L (ref 135–145)
Total Bilirubin: 0.5 mg/dL (ref <1.2)
Total Protein: 6.6 g/dL (ref 6.5–8.1)

## 2023-01-11 LAB — CBC WITH DIFFERENTIAL (CANCER CENTER ONLY)
Abs Immature Granulocytes: 0.02 10*3/uL (ref 0.00–0.07)
Basophils Absolute: 0.1 10*3/uL (ref 0.0–0.1)
Basophils Relative: 1 %
Eosinophils Absolute: 0.4 10*3/uL (ref 0.0–0.5)
Eosinophils Relative: 6 %
HCT: 40.9 % (ref 36.0–46.0)
Hemoglobin: 12.5 g/dL (ref 12.0–15.0)
Immature Granulocytes: 0 %
Lymphocytes Relative: 19 %
Lymphs Abs: 1.3 10*3/uL (ref 0.7–4.0)
MCH: 24.7 pg — ABNORMAL LOW (ref 26.0–34.0)
MCHC: 30.6 g/dL (ref 30.0–36.0)
MCV: 80.7 fL (ref 80.0–100.0)
Monocytes Absolute: 0.6 10*3/uL (ref 0.1–1.0)
Monocytes Relative: 9 %
Neutro Abs: 4.5 10*3/uL (ref 1.7–7.7)
Neutrophils Relative %: 65 %
Platelet Count: 260 10*3/uL (ref 150–400)
RBC: 5.07 MIL/uL (ref 3.87–5.11)
RDW: 16.7 % — ABNORMAL HIGH (ref 11.5–15.5)
WBC Count: 6.9 10*3/uL (ref 4.0–10.5)
nRBC: 0 % (ref 0.0–0.2)

## 2023-01-11 MED ORDER — SODIUM CHLORIDE 0.9% FLUSH
10.0000 mL | INTRAVENOUS | Status: DC | PRN
  Administered 2023-01-11: 10 mL

## 2023-01-11 MED ORDER — HEPARIN SOD (PORK) LOCK FLUSH 100 UNIT/ML IV SOLN
500.0000 [IU] | Freq: Once | INTRAVENOUS | Status: AC | PRN
Start: 2023-01-11 — End: 2023-01-11
  Administered 2023-01-11: 500 [IU]

## 2023-01-11 NOTE — Telephone Encounter (Signed)
Ate a small amount of fodd prior to CT scan . I told her that is ok. No need to repeat scan.

## 2023-01-18 ENCOUNTER — Inpatient Hospital Stay (HOSPITAL_BASED_OUTPATIENT_CLINIC_OR_DEPARTMENT_OTHER): Payer: Medicare Other | Admitting: Internal Medicine

## 2023-01-18 VITALS — BP 138/67 | HR 75 | Temp 99.5°F | Resp 17 | Wt 177.7 lb

## 2023-01-18 DIAGNOSIS — N189 Chronic kidney disease, unspecified: Secondary | ICD-10-CM | POA: Diagnosis not present

## 2023-01-18 DIAGNOSIS — C3490 Malignant neoplasm of unspecified part of unspecified bronchus or lung: Secondary | ICD-10-CM

## 2023-01-18 DIAGNOSIS — C3412 Malignant neoplasm of upper lobe, left bronchus or lung: Secondary | ICD-10-CM | POA: Diagnosis not present

## 2023-01-18 DIAGNOSIS — C7931 Secondary malignant neoplasm of brain: Secondary | ICD-10-CM | POA: Diagnosis not present

## 2023-01-18 NOTE — Progress Notes (Signed)
[facility] [phone]) [phone]   [phone]  OFFICE PROGRESS NOTE  Carolyn Sanger, FNP [address] [place] [address]  DIAGNOSIS: Stage IV (T1c, N1, M1c ) non-small cell lung cancer, adenocarcinoma diagnosed in July 2020 and presented with left upper lobe lung nodule in addition to right upper lobe lung nodule with left hilar lymphadenopathy as well as multiple brain metastasis.  Biomarker Findings Microsatellite status - MS-Stable Tumor Mutational Burden - 4 Muts/Mb Genomic Findings For a complete list of the genes assayed, please refer to the Appendix. ATM G204* KRAS G12C PDGFRA P122T SMO R199W 7 Disease relevant genes with no reportable alterations: ALK, BRAF, EGFR, ERBB2, MET, RET, ROS1  PDL 1 expression was 1%  PRIOR THERAPY:  1) SRS to multiple brain metastasis under the care of Dr. Basilio Cairo. 2) SRS to a 4 mm new brain metastasis under the care of Dr. Basilio Cairo and Dr. Wynetta Emery on 09/02/2020. 3) Systemic chemotherapy with carboplatin for AUC of 5, Alimta 500 mg/M2 and Keytruda 200 mg IV every 3 weeks.  First dose October 05, 2018.  Status post 65 cycles.  Starting from cycle #5 the patient will be on treatment with maintenance Alimta 500 mg/M2 and Keytruda 200 mg IV every 3 weeks.  She has been on treatment with single agent Keytruda starting from cycle #9 secondary to renal insufficiency.  Last dose was given June 08, 2022 after the patient has been on treatment for more than 5 years with no evidence of progression.  CURRENT THERAPY: Observation.  INTERVAL HISTORY: Carolyn [name] 67 y.o. female returns to the clinic today for follow-up visit.Discussed the use of AI scribe software for clinical note transcription with the patient, who gave verbal consent to proceed.  History of Present Illness   The patient, a 67 year old individual with a history of stage IV cancer and carcinoma, was initiated on a treatment regimen of  carboplatin, Alimta, and Keytruda in July 2020. She remained on Keytruda alone for approximately 65 cycles. The patient has been off treatment for approximately six months and reports no new symptoms such as nausea, vomiting, diarrhea, headaches, chest pain, or breathing issues during this period. She also has a KRAS G12C mutation, which is being monitored for future treatment considerations. Recent scans of the chest, abdomen, and pelvis showed no evidence of tumor spread.  The patient also reported a history of chronic shoulder pain, which culminated in a surgical intervention on the left shoulder approximately two years ago. She has not reported any recent shoulder issues.  In addition, the patient has been followed by a nephrologist for kidney health issues. Despite high numbers, the kidney condition has not worsened over the past month. The patient was reassured that the kidney issues are likely due to her other medical conditions and not a result of her actions.       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  MEDICATIONS:  Current Outpatient Medications  Medication Sig Dispense Refill   acetaminophen (TYLENOL) 500 MG tablet Take 1,000 mg by mouth every 8 (eight) hours as needed for  mild pain, fever or headache.      amLODipine (NORVASC) 10 MG tablet Take 1 tablet by mouth once daily 90 tablet 2   aspirin EC 81 MG tablet Take 81 mg by mouth daily.     carvedilol (COREG) 25 MG tablet Take 1/2 (one-half) tablet by mouth twice daily 180 tablet 2   Cholecalciferol (VITAMIN D-3) 125 MCG (5000 UT) TABS Take 5,000 Units by mouth daily.     empagliflozin (JARDIANCE) 25 MG TABS tablet Take 1 tablet (25 mg total) by mouth daily before breakfast. 90 tablet 3   esomeprazole (NEXIUM) 20 MG capsule Take 20 mg by mouth daily with breakfast.     ezetimibe (ZETIA) 10 MG tablet Take 1 tablet (10 mg total) by mouth daily. 90 tablet 3   fexofenadine (ALLEGRA) 180 MG tablet Take 180 mg by mouth daily.     fluticasone (FLONASE) 50 MCG/ACT nasal spray Place 1 spray into both nostrils daily as needed for allergies or rhinitis.     Lancets (ONETOUCH DELICA PLUS LANCET33G) MISC SMARTSIG:1 Topical 3 Times Daily     levocetirizine (XYZAL) 5 MG tablet Take 5 mg by mouth every evening.     lidocaine-prilocaine (EMLA) cream Apply to the Port-A-Cath site 30 to 60 minutes before chemotherapy. 30 g 2   Melatonin 1 MG CAPS Take 2 mg by mouth at bedtime as needed (for insomnia).     metFORMIN (GLUCOPHAGE) 500 MG tablet Take 500 mg by mouth daily with supper.     methocarbamol (ROBAXIN) 500 MG tablet Take 1 tablet (500 mg total) by mouth every 8 (eight) hours as needed for muscle spasms. 30 tablet 0   montelukast (SINGULAIR) 10 MG tablet Take 10 mg by mouth daily as needed (allergies).     nitroGLYCERIN (NITROSTAT) 0.4 MG SL tablet Place 1 tablet (0.4 mg total) under the tongue every 5 (five) minutes as needed for chest pain. 25 tablet 2   ONETOUCH ULTRA test strip USE 1 STRIP TO CHECK GLUCOSE THREE TIMES DAILY     Pitavastatin Calcium (LIVALO) 4 MG TABS Take 1 tablet (4 mg total) by mouth daily. 90 tablet 3   Polyethyl Glycol-Propyl Glycol (SYSTANE) 0.4-0.3 % GEL ophthalmic gel Place 1 application. into both  eyes every 6 (six) hours as needed (dry eyes).     Semaglutide,0.25 or 0.5MG /DOS, (OZEMPIC, 0.25 OR 0.5 MG/DOSE,) 2 MG/1.5ML SOPN Inject 0.25 mg into the skin once a week.     telmisartan (MICARDIS) 40 MG tablet Take 40 mg by mouth daily.     No current facility-administered medications for this visit.    SURGICAL HISTORY:  Past Surgical History:  Procedure Laterality Date   ABDOMINAL HYSTERECTOMY     bone spur     CARDIAC CATHETERIZATION  06/13/2012   carpel tunnel surgery Left    COLONOSCOPY     9 years ago   CORONARY ANGIOPLASTY  06/13/2012   EYE SURGERY     cyst left eye  IR IMAGING GUIDED PORT INSERTION  10/10/2018   LEFT HEART CATHETERIZATION WITH CORONARY ANGIOGRAM N/A 06/13/2012   Procedure: LEFT HEART CATHETERIZATION WITH CORONARY ANGIOGRAM;  Surgeon: Pamella Pert, MD;  Location: [facility] CATH LAB;  Service: Cardiovascular;  Laterality: N/A;   NECK SURGERY     rotator cuff surgery Right 2015   SHOULDER ARTHROSCOPY WITH ROTATOR CUFF REPAIR AND SUBACROMIAL DECOMPRESSION Left 05/14/2021   Procedure: LEFT SHOULDER ARTHROSCOPY, SUBACROMIAL DECOMPRESSION, BICEPS TENODESIS, MINI OPEN ROTATOR CUFF REPAIR;  Surgeon: Cammy Copa, MD;  Location: [facility] OR;  Service: Orthopedics;  Laterality: Left;   VIDEO BRONCHOSCOPY WITH ENDOBRONCHIAL NAVIGATION N/A 09/06/2018   Procedure: VIDEO BRONCHOSCOPY WITH ENDOBRONCHIAL NAVIGATION, left upper lung;  Surgeon: Corliss Skains, MD;  Location: [facility] OR;  Service: Thoracic;  Laterality: N/A;   VIDEO BRONCHOSCOPY WITH ENDOBRONCHIAL ULTRASOUND Left 09/06/2018   Procedure: VIDEO BRONCHOSCOPY WITH ENDOBRONCHIAL ULTRASOUND, left lung;  Surgeon: Corliss Skains, MD;  Location: [facility] OR;  Service: Thoracic;  Laterality: Left;   WISDOM TOOTH EXTRACTION      REVIEW OF SYSTEMS:  A comprehensive review of systems was negative.   PHYSICAL EXAMINATION: General appearance: alert, cooperative, and no distress Head: Normocephalic, without obvious abnormality,  atraumatic Neck: no adenopathy, no JVD, supple, symmetrical, trachea midline, and thyroid not enlarged, symmetric, no tenderness/mass/nodules Lymph nodes: Cervical, supraclavicular, and axillary nodes normal. Resp: clear to auscultation bilaterally Back: symmetric, no curvature. ROM normal. No CVA tenderness. Cardio: regular rate and rhythm, S1, S2 normal, no murmur, click, rub or gallop GI: soft, non-tender; bowel sounds normal; no masses,  no organomegaly Extremities: extremities normal, atraumatic, no cyanosis or edema  ECOG PERFORMANCE STATUS: 1 - Symptomatic but completely ambulatory  Blood pressure 138/67, pulse 75, temperature 99.5 F (37.5 C), temperature source Temporal, resp. rate 17, weight 177 lb 11.2 oz (80.6 kg), SpO2 99%.  LABORATORY DATA: Lab Results  Component Value Date   WBC 6.9 01/11/2023   HGB 12.5 01/11/2023   HCT 40.9 01/11/2023   MCV 80.7 01/11/2023   PLT 260 01/11/2023      Chemistry      Component Value Date/Time   NA 141 01/11/2023 0853   NA 139 08/18/2020 0801   K 3.9 01/11/2023 0853   CL 107 01/11/2023 0853   CO2 26 01/11/2023 0853   BUN 15 01/11/2023 0853   BUN 16 08/18/2020 0801   CREATININE 1.45 (H) 01/11/2023 0853      Component Value Date/Time   CALCIUM 9.0 01/11/2023 0853   ALKPHOS 70 01/11/2023 0853   AST 17 01/11/2023 0853   ALT 11 01/11/2023 0853   BILITOT 0.5 01/11/2023 0853       RADIOGRAPHIC STUDIES: CT Chest Wo Contrast  Result Date: 01/17/2023 CLINICAL DATA:  Restaging metastatic non-small cell lung cancer. Immunotherapy completed 6 months ago. No current complaints. * Tracking Code: BO * EXAM: CT CHEST, ABDOMEN AND PELVIS WITHOUT CONTRAST TECHNIQUE: Multidetector CT imaging of the chest, abdomen and pelvis was performed following the standard protocol without IV contrast. RADIATION DOSE REDUCTION: This exam was performed according to the departmental dose-optimization program which includes automated exposure control,  adjustment of the mA and/or kV according to patient size and/or use of iterative reconstruction technique. COMPARISON:  CTs of the chest, abdomen and pelvis 10/12/2022 and 06/25/2022. FINDINGS: CT CHEST FINDINGS Cardiovascular: Extensive coronary artery atherosclerosis again noted with lesser involvement of the aorta and great vessels. A right IJ Port-A-Cath appears unchanged at the level of the superior cavoatrial junction. Unchanged anterior  pericardial fluid versus thickening. The heart size is normal. Mediastinum/Nodes: There are no enlarged mediastinal, hilar or axillary lymph nodes. Hilar assessment is limited by the lack of intravenous contrast, although the hilar contours appear unchanged. The thyroid gland, trachea and esophagus demonstrate no significant findings. Lungs/Pleura: No pleural effusion or pneumothorax. The treated left upper lobe nodule appears stable, measuring 9 x 5 mm on image 51/6. Stable probable subpleural scarring laterally in the right upper lobe (image 44/6). No new or enlarging pulmonary nodules. Mild centrilobular emphysema. Musculoskeletal/Chest wall: No chest wall mass or suspicious osseous findings. Multilevel spondylosis status post lower cervical fusion. CT ABDOMEN AND PELVIS FINDINGS Hepatobiliary: The liver appears unremarkable as imaged in the noncontrast state. The gallbladder is incompletely distended. No evidence of gallstones, gallbladder wall thickening or biliary dilatation. Pancreas: Unremarkable. No pancreatic ductal dilatation or surrounding inflammatory changes. Spleen: Normal in size without focal abnormality. Adrenals/Urinary Tract: Both adrenal glands appear normal. No evidence of urinary tract calculus, suspicious renal lesion or hydronephrosis. There is stable renal cortical scarring bilaterally without perinephric soft tissue stranding. The bladder appears unremarkable for its degree of distention. Stomach/Bowel: No enteric contrast administered. The stomach  appears unremarkable for its degree of distension. No evidence of bowel wall thickening, distention or surrounding inflammatory change. There is a stable diverticulum involving the 3rd portion of the duodenum. Stable diverticular changes within the distal colon. The appendix appears normal. Vascular/Lymphatic: There are no enlarged abdominal or pelvic lymph nodes. Aortic and branch vessel atherosclerosis without evidence of aneurysm. Reproductive: Hysterectomy.  No evidence of adnexal mass. Other: No evidence of abdominal wall mass or hernia. No ascites or pneumoperitoneum. Musculoskeletal: No acute or significant osseous findings. Multilevel spondylosis with a degenerative grade 1 anterolisthesis at L4-5. IMPRESSION: 1. Stable CT of the chest, abdomen and pelvis. No evidence of local recurrence or metastatic disease. 2. Stable treated left upper lobe nodule. No new, enlarging or suspicious pulmonary nodules. 3. Stable small anterior pericardial effusion or thickening. 4. Aortic Atherosclerosis (ICD10-I70.0) and Emphysema (ICD10-J43.9). Electronically Signed   By: Carey Bullocks M.D.   On: 01/17/2023 16:55   CT ABDOMEN PELVIS WO CONTRAST  Result Date: 01/17/2023 CLINICAL DATA:  Restaging metastatic non-small cell lung cancer. Immunotherapy completed 6 months ago. No current complaints. * Tracking Code: BO * EXAM: CT CHEST, ABDOMEN AND PELVIS WITHOUT CONTRAST TECHNIQUE: Multidetector CT imaging of the chest, abdomen and pelvis was performed following the standard protocol without IV contrast. RADIATION DOSE REDUCTION: This exam was performed according to the departmental dose-optimization program which includes automated exposure control, adjustment of the mA and/or kV according to patient size and/or use of iterative reconstruction technique. COMPARISON:  CTs of the chest, abdomen and pelvis 10/12/2022 and 06/25/2022. FINDINGS: CT CHEST FINDINGS Cardiovascular: Extensive coronary artery atherosclerosis again  noted with lesser involvement of the aorta and great vessels. A right IJ Port-A-Cath appears unchanged at the level of the superior cavoatrial junction. Unchanged anterior pericardial fluid versus thickening. The heart size is normal. Mediastinum/Nodes: There are no enlarged mediastinal, hilar or axillary lymph nodes. Hilar assessment is limited by the lack of intravenous contrast, although the hilar contours appear unchanged. The thyroid gland, trachea and esophagus demonstrate no significant findings. Lungs/Pleura: No pleural effusion or pneumothorax. The treated left upper lobe nodule appears stable, measuring 9 x 5 mm on image 51/6. Stable probable subpleural scarring laterally in the right upper lobe (image 44/6). No new or enlarging pulmonary nodules. Mild centrilobular emphysema. Musculoskeletal/Chest wall: No chest wall mass or suspicious  osseous findings. Multilevel spondylosis status post lower cervical fusion. CT ABDOMEN AND PELVIS FINDINGS Hepatobiliary: The liver appears unremarkable as imaged in the noncontrast state. The gallbladder is incompletely distended. No evidence of gallstones, gallbladder wall thickening or biliary dilatation. Pancreas: Unremarkable. No pancreatic ductal dilatation or surrounding inflammatory changes. Spleen: Normal in size without focal abnormality. Adrenals/Urinary Tract: Both adrenal glands appear normal. No evidence of urinary tract calculus, suspicious renal lesion or hydronephrosis. There is stable renal cortical scarring bilaterally without perinephric soft tissue stranding. The bladder appears unremarkable for its degree of distention. Stomach/Bowel: No enteric contrast administered. The stomach appears unremarkable for its degree of distension. No evidence of bowel wall thickening, distention or surrounding inflammatory change. There is a stable diverticulum involving the 3rd portion of the duodenum. Stable diverticular changes within the distal colon. The appendix  appears normal. Vascular/Lymphatic: There are no enlarged abdominal or pelvic lymph nodes. Aortic and branch vessel atherosclerosis without evidence of aneurysm. Reproductive: Hysterectomy.  No evidence of adnexal mass. Other: No evidence of abdominal wall mass or hernia. No ascites or pneumoperitoneum. Musculoskeletal: No acute or significant osseous findings. Multilevel spondylosis with a degenerative grade 1 anterolisthesis at L4-5. IMPRESSION: 1. Stable CT of the chest, abdomen and pelvis. No evidence of local recurrence or metastatic disease. 2. Stable treated left upper lobe nodule. No new, enlarging or suspicious pulmonary nodules. 3. Stable small anterior pericardial effusion or thickening. 4. Aortic Atherosclerosis (ICD10-I70.0) and Emphysema (ICD10-J43.9). Electronically Signed   By: Carey Bullocks M.D.   On: 01/17/2023 16:55     ASSESSMENT AND PLAN: This is a very pleasant 67 years old African-American female with likely stage IV (T1c, N1, M1C) non-small cell lung cancer, adenocarcinoma with positive KRAS G12C mutation diagnosed in July 2020 and presented with left upper lobe lung nodule in addition to right upper lobe pulmonary nodule as well as left hilar adenopathy and metastatic lesion to the brain. Molecular studies showed no actionable mutation and PDL 1 expression was 1%. The patient underwent SBRT to the metastatic brain lesion under the care of Dr. Basilio Cairo. The patient is currently undergoing systemic chemotherapy with carboplatin for AUC of 5, Alimta 500 mg/M2 and Keytruda 200 mg IV every 3 weeks is status post 65 cycles.  Starting from cycle #5 she is on maintenance treatment with Alimta and Keytruda every 3 weeks with only Keytruda on the last few cycles because of the renal insufficiency. She is currently on observation and she is feeling fine with no concerning complaints.  She had repeat CT scan of the chest, abdomen and pelvis performed recently.  I personally and independently  reviewed the scan and discussed the result with the patient today. Her scan showed no concerning findings for disease progression.    Metastatic Non-Small Cell Lung Cancer (NSCLC) Metastatic NSCLC with KRAS G12C mutation, diagnosed July 2020. Treated with carboplatin, pemetrexed, and pembrolizumab. Off treatment for six months with no new symptoms. Recent imaging shows no tumor progression. Prefers surveillance without additional treatment. - Continue surveillance with follow-up in three months - Order chest, abdomen, and pelvis scan one week prior to next visit  Chronic Kidney Disease (CKD) CKD likely secondary to other conditions. Under nephrology care with well-managed kidney function. - Continue nephrology follow-up - Monitor kidney function regularly  Post-Surgical Status of Left Shoulder Left shoulder surgery two years ago for chronic pain and injury. No current issues. - No specific plan required  Follow-up - Schedule follow-up appointment in three months - Ensure imaging is completed  one week prior to next visit.    For the brain metastasis, she is followed by Dr. Barbaraann Cao and she is supposed to have another MRI of the brain in July 2024. For the renal insufficiency, she is followed by nephrology. She will have Port-A-Cath flush every 6 weeks. The patient was advised to call immediately if she has any other concerning symptoms in the interval. The patient voices understanding of current disease status and treatment options and is in agreement with the current care plan. All questions were answered. The patient knows to call the clinic with any problems, questions or concerns. We can certainly see the patient much sooner if necessary. The total time spent in the appointment was 20 minutes.  Disclaimer: This note was dictated with voice recognition software. Similar sounding words can inadvertently be transcribed and may not be corrected upon review.

## 2023-02-16 ENCOUNTER — Telehealth: Payer: Self-pay | Admitting: Internal Medicine

## 2023-02-16 NOTE — Telephone Encounter (Signed)
 Left patient a message in regards to rescheduled appointment due to staff meeting in infusion

## 2023-02-22 DIAGNOSIS — E1122 Type 2 diabetes mellitus with diabetic chronic kidney disease: Secondary | ICD-10-CM | POA: Diagnosis not present

## 2023-02-23 ENCOUNTER — Inpatient Hospital Stay: Payer: Medicare Other | Attending: Physician Assistant

## 2023-02-23 DIAGNOSIS — Z95828 Presence of other vascular implants and grafts: Secondary | ICD-10-CM

## 2023-02-23 DIAGNOSIS — C3412 Malignant neoplasm of upper lobe, left bronchus or lung: Secondary | ICD-10-CM | POA: Diagnosis not present

## 2023-02-23 LAB — LAB REPORT - SCANNED
A1c: 5.9
EGFR: 38

## 2023-02-23 MED ORDER — HEPARIN SOD (PORK) LOCK FLUSH 100 UNIT/ML IV SOLN
500.0000 [IU] | Freq: Once | INTRAVENOUS | Status: AC | PRN
  Administered 2023-02-23: 500 [IU]

## 2023-02-23 MED ORDER — SODIUM CHLORIDE 0.9% FLUSH
10.0000 mL | INTRAVENOUS | Status: DC | PRN
  Administered 2023-02-23: 10 mL

## 2023-03-01 DIAGNOSIS — E785 Hyperlipidemia, unspecified: Secondary | ICD-10-CM | POA: Diagnosis not present

## 2023-03-01 DIAGNOSIS — E559 Vitamin D deficiency, unspecified: Secondary | ICD-10-CM | POA: Diagnosis not present

## 2023-03-01 DIAGNOSIS — I10 Essential (primary) hypertension: Secondary | ICD-10-CM | POA: Diagnosis not present

## 2023-03-01 DIAGNOSIS — I2510 Atherosclerotic heart disease of native coronary artery without angina pectoris: Secondary | ICD-10-CM | POA: Diagnosis not present

## 2023-03-01 DIAGNOSIS — E119 Type 2 diabetes mellitus without complications: Secondary | ICD-10-CM | POA: Diagnosis not present

## 2023-03-14 DIAGNOSIS — C3492 Malignant neoplasm of unspecified part of left bronchus or lung: Secondary | ICD-10-CM | POA: Diagnosis not present

## 2023-03-14 DIAGNOSIS — M25552 Pain in left hip: Secondary | ICD-10-CM | POA: Diagnosis not present

## 2023-03-14 DIAGNOSIS — M1990 Unspecified osteoarthritis, unspecified site: Secondary | ICD-10-CM | POA: Diagnosis not present

## 2023-03-14 DIAGNOSIS — Z79899 Other long term (current) drug therapy: Secondary | ICD-10-CM | POA: Diagnosis not present

## 2023-03-14 DIAGNOSIS — N1831 Chronic kidney disease, stage 3a: Secondary | ICD-10-CM | POA: Diagnosis not present

## 2023-03-14 DIAGNOSIS — M359 Systemic involvement of connective tissue, unspecified: Secondary | ICD-10-CM | POA: Diagnosis not present

## 2023-03-14 DIAGNOSIS — R768 Other specified abnormal immunological findings in serum: Secondary | ICD-10-CM | POA: Diagnosis not present

## 2023-03-24 DIAGNOSIS — J101 Influenza due to other identified influenza virus with other respiratory manifestations: Secondary | ICD-10-CM | POA: Diagnosis not present

## 2023-03-26 ENCOUNTER — Other Ambulatory Visit: Payer: Self-pay | Admitting: Cardiology

## 2023-04-05 ENCOUNTER — Other Ambulatory Visit: Payer: Medicare Other

## 2023-04-07 DIAGNOSIS — J3089 Other allergic rhinitis: Secondary | ICD-10-CM | POA: Diagnosis not present

## 2023-04-07 DIAGNOSIS — R052 Subacute cough: Secondary | ICD-10-CM | POA: Diagnosis not present

## 2023-04-11 ENCOUNTER — Other Ambulatory Visit: Payer: Self-pay | Admitting: Internal Medicine

## 2023-04-11 ENCOUNTER — Inpatient Hospital Stay: Payer: Medicare Other | Attending: Physician Assistant

## 2023-04-11 ENCOUNTER — Ambulatory Visit (HOSPITAL_COMMUNITY)
Admission: RE | Admit: 2023-04-11 | Discharge: 2023-04-11 | Disposition: A | Discharge status: Home / Self Care | Payer: Medicare Other | Source: Ambulatory Visit | Attending: Internal Medicine | Admitting: Internal Medicine

## 2023-04-11 DIAGNOSIS — C3490 Malignant neoplasm of unspecified part of unspecified bronchus or lung: Secondary | ICD-10-CM

## 2023-04-11 DIAGNOSIS — J432 Centrilobular emphysema: Secondary | ICD-10-CM | POA: Diagnosis not present

## 2023-04-11 DIAGNOSIS — K449 Diaphragmatic hernia without obstruction or gangrene: Secondary | ICD-10-CM | POA: Diagnosis not present

## 2023-04-11 DIAGNOSIS — N289 Disorder of kidney and ureter, unspecified: Secondary | ICD-10-CM | POA: Diagnosis not present

## 2023-04-11 DIAGNOSIS — C3412 Malignant neoplasm of upper lobe, left bronchus or lung: Secondary | ICD-10-CM | POA: Diagnosis not present

## 2023-04-11 DIAGNOSIS — C7931 Secondary malignant neoplasm of brain: Secondary | ICD-10-CM | POA: Insufficient documentation

## 2023-04-11 DIAGNOSIS — Z95828 Presence of other vascular implants and grafts: Secondary | ICD-10-CM

## 2023-04-11 DIAGNOSIS — I700 Atherosclerosis of aorta: Secondary | ICD-10-CM | POA: Diagnosis not present

## 2023-04-11 LAB — CBC WITH DIFFERENTIAL (CANCER CENTER ONLY)
Abs Immature Granulocytes: 0.01 10*3/uL (ref 0.00–0.07)
Basophils Absolute: 0 10*3/uL (ref 0.0–0.1)
Basophils Relative: 1 %
Eosinophils Absolute: 0.3 10*3/uL (ref 0.0–0.5)
Eosinophils Relative: 5 %
HCT: 38.8 % (ref 36.0–46.0)
Hemoglobin: 12 g/dL (ref 12.0–15.0)
Immature Granulocytes: 0 %
Lymphocytes Relative: 21 %
Lymphs Abs: 1.5 10*3/uL (ref 0.7–4.0)
MCH: 25.1 pg — ABNORMAL LOW (ref 26.0–34.0)
MCHC: 30.9 g/dL (ref 30.0–36.0)
MCV: 81.2 fL (ref 80.0–100.0)
Monocytes Absolute: 0.7 10*3/uL (ref 0.1–1.0)
Monocytes Relative: 10 %
Neutro Abs: 4.4 10*3/uL (ref 1.7–7.7)
Neutrophils Relative %: 63 %
Platelet Count: 257 10*3/uL (ref 150–400)
RBC: 4.78 MIL/uL (ref 3.87–5.11)
RDW: 16.7 % — ABNORMAL HIGH (ref 11.5–15.5)
WBC Count: 6.9 10*3/uL (ref 4.0–10.5)
nRBC: 0 % (ref 0.0–0.2)

## 2023-04-11 LAB — CMP (CANCER CENTER ONLY)
ALT: 10 U/L (ref 0–44)
AST: 16 U/L (ref 15–41)
Albumin: 3.7 g/dL (ref 3.5–5.0)
Alkaline Phosphatase: 62 U/L (ref 38–126)
Anion gap: 6 (ref 5–15)
BUN: 11 mg/dL (ref 8–23)
CO2: 28 mmol/L (ref 22–32)
Calcium: 9 mg/dL (ref 8.9–10.3)
Chloride: 106 mmol/L (ref 98–111)
Creatinine: 1.5 mg/dL — ABNORMAL HIGH (ref 0.44–1.00)
GFR, Estimated: 37 mL/min — ABNORMAL LOW (ref >60)
Glucose, Bld: 97 mg/dL (ref 70–99)
Potassium: 4.1 mmol/L (ref 3.5–5.1)
Sodium: 140 mmol/L (ref 135–145)
Total Bilirubin: 0.4 mg/dL (ref 0.0–1.2)
Total Protein: 6.5 g/dL (ref 6.5–8.1)

## 2023-04-11 MED ORDER — SODIUM CHLORIDE 0.9% FLUSH
10.0000 mL | INTRAVENOUS | Status: DC | PRN
  Administered 2023-04-11: 10 mL

## 2023-04-11 MED ORDER — HEPARIN SOD (PORK) LOCK FLUSH 100 UNIT/ML IV SOLN
500.0000 [IU] | Freq: Once | INTRAVENOUS | Status: AC | PRN
  Administered 2023-04-11: 500 [IU]

## 2023-04-19 ENCOUNTER — Inpatient Hospital Stay: Payer: Medicare Other | Admitting: Internal Medicine

## 2023-04-19 ENCOUNTER — Telehealth: Payer: Self-pay | Admitting: Physician Assistant

## 2023-04-19 NOTE — Telephone Encounter (Signed)
 Rescheduled appointment per provider request. Left the patient a voicemail with the new appointment details.

## 2023-04-21 ENCOUNTER — Ambulatory Visit: Admitting: Physician Assistant

## 2023-04-24 NOTE — Progress Notes (Unsigned)
 [facility] OFFICE PROGRESS NOTE  Carolyn Sanger, FNP [address] [place] [address]  DIAGNOSIS: Stage IV (T1c, N1, M1c ) non-small cell lung cancer, adenocarcinoma diagnosed in July 2020 and presented with left upper lobe lung nodule in addition to right upper lobe lung nodule with left hilar lymphadenopathy as well as multiple brain metastasis.   Biomarker Findings Microsatellite status - MS-Stable Tumor Mutational Burden - 4 Muts/Mb Genomic Findings For a complete list of the genes assayed, please refer to the Appendix. ATM G204* KRAS G12C PDGFRA P122T SMO R199W 7 Disease relevant genes with no reportable alterations: ALK, BRAF, EGFR, ERBB2, MET, RET, ROS1   PDL 1 expression was 1%  PRIOR THERAPY: 1) SBRT to multiple brain metastasis under the care of Dr. Basilio Cairo. Completed in August 2020 2) SRS to the new metastatic brain lesions under the care of Dr. Wynetta Emery and Dr. Basilio Cairo on 09/02/20 and 12/16/20 3) Systemic chemotherapy with carboplatin for AUC of 5, Alimta 500 mg/M2 and Keytruda 200 mg IV every 3 weeks. First dose October 05, 2018. Status post 65 cycles. Starting from cycle #5 the patient will be on treatment with maintenance Alimta 500 mg/M2 and Keytruda 200 mg IV every 3 weeks. She has been on treatment with single agent Keytruda starting from cycle #9 secondary to renal insufficiency. Last dose was given June 08, 2022 after the patient has been on treatment for more than 5 years with no evidence of progression.   CURRENT THERAPY: Observation  INTERVAL HISTORY: Carolyn [name] 68 y.o. female returns to the clinic today for a follow-up visit the patient was last seen in the clinic in December 2024.  In summary the patient was diagnosed with stage IV lung cancer in July 2020.  She was on systemic treatment with chemotherapy and immunotherapy.  Chemotherapy was discontinued due to renal insufficiency.  She was on immunotherapy for 65 cycles.   She has been off treatment since April 2024 and doing well without any concerning symptoms.  Of note she also has a K-ras G12 C mutation which can be used in the future for second line targeted treatment.  She denies any major changes in her health since she was last seen.  She denies any fever, chills, night sweats, or unexplained weight loss.  Denies any shortness of breath, cough, hemoptysis, or chest pain.  Denies any nausea, vomiting, diarrhea, or constipation.  Denies any rashes or skin changes.  Denies any headache or visual changes.  She does have a history of metastatic disease to the brain for which she follows closely with Dr. Barbaraann Cao and is scheduled for an MRI on ***.  The patient follows closely with nephrology.  She recently had a restaging CT scan.  She is here today for evaluation to review her scan results. 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  MEDICATIONS:  Current Outpatient Medications  Medication Sig Dispense Refill   acetaminophen (TYLENOL) 500 MG tablet Take 1,000 mg by mouth every 8 (eight) hours as needed for mild pain, fever or headache.      amLODipine (NORVASC) 10 MG tablet Take 1 tablet by mouth once daily 90 tablet 2   aspirin EC 81 MG tablet Take 81 mg by  mouth daily.     carvedilol (COREG) 25 MG tablet Take 1/2 (one-half) tablet by mouth twice daily 90 tablet 2   Cholecalciferol (VITAMIN D-3) 125 MCG (5000 UT) TABS Take 5,000 Units by mouth daily.     empagliflozin (JARDIANCE) 25 MG TABS tablet Take 1 tablet (25 mg total) by mouth daily before breakfast. 90 tablet 3   esomeprazole (NEXIUM) 20 MG capsule Take 20 mg by mouth daily with breakfast.     ezetimibe (ZETIA) 10 MG tablet Take 1 tablet (10 mg total) by mouth daily. 90 tablet 3   fexofenadine (ALLEGRA) 180 MG tablet Take 180 mg by mouth daily.     fluticasone (FLONASE) 50 MCG/ACT nasal spray Place 1 spray into both nostrils daily as needed for allergies or rhinitis.     Lancets (ONETOUCH DELICA PLUS LANCET33G) MISC SMARTSIG:1 Topical 3 Times Daily     levocetirizine (XYZAL) 5 MG tablet Take 5 mg by mouth every evening.     lidocaine-prilocaine (EMLA) cream Apply to the Port-A-Cath site 30 to 60 minutes before chemotherapy. 30 g 2   Melatonin 1 MG CAPS Take 2 mg by mouth at bedtime as needed (for insomnia).     metFORMIN (GLUCOPHAGE) 500 MG tablet Take 500 mg by mouth daily with supper.     methocarbamol (ROBAXIN) 500 MG tablet Take 1 tablet (500 mg total) by mouth every 8 (eight) hours as needed for muscle spasms. 30 tablet 0   montelukast (SINGULAIR) 10 MG tablet Take 10 mg by mouth daily as needed (allergies).     nitroGLYCERIN (NITROSTAT) 0.4 MG SL tablet Place 1 tablet (0.4 mg total) under the tongue every 5 (five) minutes as needed for chest pain. 25 tablet 2   ONETOUCH ULTRA test strip USE 1 STRIP TO CHECK GLUCOSE THREE TIMES DAILY     Pitavastatin Calcium (LIVALO) 4 MG TABS Take 1 tablet (4 mg total) by mouth daily. 90 tablet 3   Polyethyl Glycol-Propyl Glycol (SYSTANE) 0.4-0.3 % GEL ophthalmic gel Place 1 application. into both eyes every 6 (six) hours as needed (dry eyes).     Semaglutide,0.25 or 0.5MG /DOS, (OZEMPIC, 0.25 OR 0.5 MG/DOSE,) 2 MG/1.5ML SOPN Inject 0.25 mg into the skin  once a week.     telmisartan (MICARDIS) 40 MG tablet Take 40 mg by mouth daily.     No current facility-administered medications for this visit.    SURGICAL HISTORY:  Past Surgical History:  Procedure Laterality Date   ABDOMINAL HYSTERECTOMY     bone spur     CARDIAC CATHETERIZATION  06/13/2012   carpel tunnel surgery Left    COLONOSCOPY     9 years ago   CORONARY ANGIOPLASTY  06/13/2012   EYE SURGERY     cyst left eye    IR IMAGING GUIDED PORT INSERTION  10/10/2018   LEFT  HEART CATHETERIZATION WITH CORONARY ANGIOGRAM N/A 06/13/2012   Procedure: LEFT HEART CATHETERIZATION WITH CORONARY ANGIOGRAM;  Surgeon: Pamella Pert, MD;  Location: [facility] CATH LAB;  Service: Cardiovascular;  Laterality: N/A;   NECK SURGERY     rotator cuff surgery Right 2015   SHOULDER ARTHROSCOPY WITH ROTATOR CUFF REPAIR AND SUBACROMIAL DECOMPRESSION Left 05/14/2021   Procedure: LEFT SHOULDER ARTHROSCOPY, SUBACROMIAL DECOMPRESSION, BICEPS TENODESIS, MINI OPEN ROTATOR CUFF REPAIR;  Surgeon: Cammy Copa, MD;  Location: [facility] OR;  Service: Orthopedics;  Laterality: Left;   VIDEO BRONCHOSCOPY WITH ENDOBRONCHIAL NAVIGATION N/A 09/06/2018   Procedure: VIDEO BRONCHOSCOPY WITH ENDOBRONCHIAL NAVIGATION, left upper lung;  Surgeon: Corliss Skains, MD;  Location: [facility] OR;  Service: Thoracic;  Laterality: N/A;   VIDEO BRONCHOSCOPY WITH ENDOBRONCHIAL ULTRASOUND Left 09/06/2018   Procedure: VIDEO BRONCHOSCOPY WITH ENDOBRONCHIAL ULTRASOUND, left lung;  Surgeon: Corliss Skains, MD;  Location: [facility] OR;  Service: Thoracic;  Laterality: Left;   WISDOM TOOTH EXTRACTION      REVIEW OF SYSTEMS:   Review of Systems  Constitutional: Negative for appetite change, chills, fatigue, fever and unexpected weight change.  HENT:   Negative for mouth sores, nosebleeds, sore throat and trouble swallowing.   Eyes: Negative for eye problems and icterus.  Respiratory: Negative for cough, hemoptysis, shortness of breath and wheezing.    Cardiovascular: Negative for chest pain and leg swelling.  Gastrointestinal: Negative for abdominal pain, constipation, diarrhea, nausea and vomiting.  Genitourinary: Negative for bladder incontinence, difficulty urinating, dysuria, frequency and hematuria.   Musculoskeletal: Negative for back pain, gait problem, neck pain and neck stiffness.  Skin: Negative for itching and rash.  Neurological: Negative for dizziness, extremity weakness, gait problem, headaches, light-headedness and seizures.  Hematological: Negative for adenopathy. Does not bruise/bleed easily.  Psychiatric/Behavioral: Negative for confusion, depression and sleep disturbance. The patient is not nervous/anxious.     PHYSICAL EXAMINATION:  There were no vitals taken for this visit.  ECOG PERFORMANCE STATUS: {CHL ONC ECOG [id number]  Physical Exam  Constitutional: Oriented to person, place, and time and well-developed, well-nourished, and in no distress. No distress.  HENT:  Head: Normocephalic and atraumatic.  Mouth/Throat: Oropharynx is clear and moist. No oropharyngeal exudate.  Eyes: Conjunctivae are normal. Right eye exhibits no discharge. Left eye exhibits no discharge. No scleral icterus.  Neck: Normal range of motion. Neck supple.  Cardiovascular: Normal rate, regular rhythm, normal heart sounds and intact distal pulses.   Pulmonary/Chest: Effort normal and breath sounds normal. No respiratory distress. No wheezes. No rales.  Abdominal: Soft. Bowel sounds are normal. Exhibits no distension and no mass. There is no tenderness.  Musculoskeletal: Normal range of motion. Exhibits no edema.  Lymphadenopathy:    No cervical adenopathy.  Neurological: Alert and oriented to person, place, and time. Exhibits normal muscle tone. Gait normal. Coordination normal.  Skin: Skin is warm and dry. No rash noted. Not diaphoretic. No erythema. No pallor.  Psychiatric: Mood, memory and judgment normal.  Vitals  reviewed.  LABORATORY DATA: Lab Results  Component Value Date   WBC 6.9 04/11/2023   HGB 12.0 04/11/2023   HCT 38.8 04/11/2023   MCV 81.2 04/11/2023   PLT 257 04/11/2023      Chemistry      Component Value Date/Time   NA 140 04/11/2023 0738   NA 139 08/18/2020 0801   K 4.1 04/11/2023 0738   CL 106 04/11/2023 0738   CO2 28 04/11/2023 0738   BUN 11 04/11/2023 0738   BUN 16 08/18/2020  0801   CREATININE 1.50 (H) 04/11/2023 0738      Component Value Date/Time   CALCIUM 9.0 04/11/2023 0738   ALKPHOS 62 04/11/2023 0738   AST 16 04/11/2023 0738   ALT 10 04/11/2023 0738   BILITOT 0.4 04/11/2023 0738       RADIOGRAPHIC STUDIES:  CT CHEST ABDOMEN PELVIS WO CONTRAST Result Date: 04/18/2023 CLINICAL DATA:  Three-month follow-up of non-small-cell lung cancer. Restaging. * Tracking Code: BO * EXAM: CT CHEST, ABDOMEN AND PELVIS WITHOUT CONTRAST TECHNIQUE: Multidetector CT imaging of the chest, abdomen and pelvis was performed following the standard protocol without IV contrast. RADIATION DOSE REDUCTION: This exam was performed according to the departmental dose-optimization program which includes automated exposure control, adjustment of the mA and/or kV according to patient size and/or use of iterative reconstruction technique. COMPARISON:  01/11/2023 FINDINGS: CT CHEST FINDINGS Cardiovascular: Right Port-A-Cath tip high right atrium. Bovine arch. Aortic atherosclerosis. Normal heart size with moderate anterior pericardial fluid, similar. Mediastinum/Nodes: No supraclavicular adenopathy. No mediastinal or hilar adenopathy, given limitations of unenhanced CT. Tiny hiatal hernia. Lungs/Pleura: No pleural fluid. Mild centrilobular and paraseptal emphysema. Similar subpleural right upper lobe linear scarring including on 41/4. Irregular left upper lobe nodule measures 7 x 6 mm on 48/4 and is similar to on the prior exam (when remeasured). Similar mild surrounding ground-glass and interstitial  thickening, presumably treatment related. Musculoskeletal: Included within the abdomen pelvic section. CT ABDOMEN PELVIS FINDINGS Hepatobiliary: Normal liver. Normal gallbladder, without biliary ductal dilatation. Pancreas: Normal, without mass or ductal dilatation. Spleen: Normal in size, without focal abnormality. Adrenals/Urinary Tract: Normal adrenal glands. Bilateral mild renal cortical scarring. No renal calculi or hydronephrosis. No bladder calculi. Stomach/Bowel: Normal remainder of the stomach. Colonic stool burden suggests constipation. Normal terminal ileum and appendix. Normal small bowel. Vascular/Lymphatic: Advanced aortic and branch vessel atherosclerosis. No abdominopelvic adenopathy. Reproductive: Hysterectomy.  No adnexal mass. Other: No significant free fluid. No evidence of omental or peritoneal disease. Musculoskeletal: Cervical spine fixation. Grade 1 L4-5 anterolisthesis. IMPRESSION: 1. Similar appearance of treated left upper lobe nodule. No thoracic adenopathy or distant metastasis. 2. Incidental findings, including: Aortic atherosclerosis (ICD10-I70.0) and emphysema (ICD10-J43.9). Tiny hiatal hernia. Similar moderate anterior pericardial fluid. Electronically Signed   By: Jeronimo Greaves M.D.   On: 04/18/2023 14:52     ASSESSMENT/PLAN:  This is a very pleasant 68 year old African-American female with stage IV (T1c, N1, M1c) non-small cell lung cancer, adenocarcinoma. She presented with a dominant left upper lobe lung nodule in addition to a right upper lobe pulmonary nodule with left hilar lymphadenopathy. She also has metastatic disease to the brain. She has no actionable mutations and her PDL1 expression is 1%. She was diagnosed in July 2020. She also has KRASG12C which is a targetable mutation that can be used in the second line setting.    The patient completed SBRT to the metastatic brain lesion under the care of Dr. Basilio Cairo in August 2020. She also had SRS again on 09/02/20 and  12/16/20.    The patient underwent palliative systemic chemotherapy with carboplatin for an AUC of 5, Alimta 500 mg/m, and Keytruda 200 mg IV every 3 weeks.  She is status post 65  cycles of treatment.  Starting from cycle #5, she had been on maintenance Alimta 500 mg/m2 and Keytruda 200 mg IV. She tolerated her treatment well without any concerning adverse side effects. Alimta was discontinued due to renal insufficiency.  The patient was on observation starting from April 2024.   The patient  was seen with Dr. Arbutus Ped today.  Dr. Arbutus Ped personally and independently reviewed the scan and discussed results with the patient today.  The scan showed ***.  Dr. Arbutus Ped recommends ***  She will continue on observation with a restaging CT scan of the chest in 3 months.   ***Flush  We will see her back for labs and follow up about 1 week after the restaging CT scan  She will continue to follow with Dr. Barbaraann Cao for her history of metastatic disease to the brain.   The patient was advised to call immediately if she has any concerning symptoms in the interval. The patient voices understanding of current disease status and treatment options and is in agreement with the current care plan. All questions were answered. The patient knows to call the clinic with any problems, questions or concerns. We can certainly see the patient much sooner if necessary        No orders of the defined types were placed in this encounter.    I spent {CHL ONC TIME VISIT - ZOXWR:[id number]} counseling the patient face to face. The total time spent in the appointment was {CHL ONC TIME VISIT - BJYNW:[id number]}.  Torry Adamczak L Lyndal Alamillo, PA-C 04/24/23

## 2023-04-28 ENCOUNTER — Inpatient Hospital Stay (HOSPITAL_BASED_OUTPATIENT_CLINIC_OR_DEPARTMENT_OTHER): Admitting: Physician Assistant

## 2023-04-28 VITALS — BP 130/67 | HR 67 | Temp 98.0°F | Resp 13 | Wt 167.1 lb

## 2023-04-28 DIAGNOSIS — C7931 Secondary malignant neoplasm of brain: Secondary | ICD-10-CM | POA: Diagnosis not present

## 2023-04-28 DIAGNOSIS — N289 Disorder of kidney and ureter, unspecified: Secondary | ICD-10-CM | POA: Diagnosis not present

## 2023-04-28 DIAGNOSIS — C3412 Malignant neoplasm of upper lobe, left bronchus or lung: Secondary | ICD-10-CM | POA: Diagnosis not present

## 2023-05-17 ENCOUNTER — Inpatient Hospital Stay: Payer: Medicare Other | Attending: Physician Assistant

## 2023-05-17 VITALS — BP 138/73 | HR 67 | Temp 99.0°F | Resp 18

## 2023-05-17 DIAGNOSIS — Z95828 Presence of other vascular implants and grafts: Secondary | ICD-10-CM

## 2023-05-17 DIAGNOSIS — C3412 Malignant neoplasm of upper lobe, left bronchus or lung: Secondary | ICD-10-CM | POA: Diagnosis not present

## 2023-05-17 DIAGNOSIS — C7931 Secondary malignant neoplasm of brain: Secondary | ICD-10-CM | POA: Diagnosis not present

## 2023-05-17 MED ORDER — HEPARIN SOD (PORK) LOCK FLUSH 100 UNIT/ML IV SOLN
500.0000 [IU] | Freq: Once | INTRAVENOUS | Status: AC | PRN
  Administered 2023-05-17: 500 [IU]

## 2023-05-17 MED ORDER — SODIUM CHLORIDE 0.9% FLUSH
10.0000 mL | INTRAVENOUS | Status: DC | PRN
  Administered 2023-05-17: 10 mL

## 2023-06-02 DIAGNOSIS — E1165 Type 2 diabetes mellitus with hyperglycemia: Secondary | ICD-10-CM | POA: Diagnosis not present

## 2023-06-02 DIAGNOSIS — E559 Vitamin D deficiency, unspecified: Secondary | ICD-10-CM | POA: Diagnosis not present

## 2023-06-02 DIAGNOSIS — Z0000 Encounter for general adult medical examination without abnormal findings: Secondary | ICD-10-CM | POA: Diagnosis not present

## 2023-06-02 DIAGNOSIS — I10 Essential (primary) hypertension: Secondary | ICD-10-CM | POA: Diagnosis not present

## 2023-06-02 LAB — LAB REPORT - SCANNED: EGFR: 38

## 2023-06-09 DIAGNOSIS — E669 Obesity, unspecified: Secondary | ICD-10-CM | POA: Diagnosis not present

## 2023-06-09 DIAGNOSIS — E559 Vitamin D deficiency, unspecified: Secondary | ICD-10-CM | POA: Diagnosis not present

## 2023-06-09 DIAGNOSIS — Z0000 Encounter for general adult medical examination without abnormal findings: Secondary | ICD-10-CM | POA: Diagnosis not present

## 2023-06-09 DIAGNOSIS — E611 Iron deficiency: Secondary | ICD-10-CM | POA: Diagnosis not present

## 2023-06-09 DIAGNOSIS — I10 Essential (primary) hypertension: Secondary | ICD-10-CM | POA: Diagnosis not present

## 2023-06-09 DIAGNOSIS — N1831 Chronic kidney disease, stage 3a: Secondary | ICD-10-CM | POA: Diagnosis not present

## 2023-06-09 DIAGNOSIS — I2510 Atherosclerotic heart disease of native coronary artery without angina pectoris: Secondary | ICD-10-CM | POA: Diagnosis not present

## 2023-06-09 DIAGNOSIS — E1122 Type 2 diabetes mellitus with diabetic chronic kidney disease: Secondary | ICD-10-CM | POA: Diagnosis not present

## 2023-06-09 DIAGNOSIS — E785 Hyperlipidemia, unspecified: Secondary | ICD-10-CM | POA: Diagnosis not present

## 2023-06-09 DIAGNOSIS — K219 Gastro-esophageal reflux disease without esophagitis: Secondary | ICD-10-CM | POA: Diagnosis not present

## 2023-06-16 ENCOUNTER — Ambulatory Visit
Admission: RE | Admit: 2023-06-16 | Discharge: 2023-06-16 | Disposition: A | Discharge status: Home / Self Care | Payer: Medicare Other | Source: Ambulatory Visit | Attending: Internal Medicine | Admitting: Internal Medicine

## 2023-06-16 DIAGNOSIS — C801 Malignant (primary) neoplasm, unspecified: Secondary | ICD-10-CM | POA: Diagnosis not present

## 2023-06-16 DIAGNOSIS — Z08 Encounter for follow-up examination after completed treatment for malignant neoplasm: Secondary | ICD-10-CM | POA: Diagnosis not present

## 2023-06-16 DIAGNOSIS — C7931 Secondary malignant neoplasm of brain: Secondary | ICD-10-CM

## 2023-06-16 MED ORDER — GADOPICLENOL 0.5 MMOL/ML IV SOLN
9.0000 mL | Freq: Once | INTRAVENOUS | Status: AC | PRN
  Administered 2023-06-16: 9 mL via INTRAVENOUS

## 2023-06-20 ENCOUNTER — Inpatient Hospital Stay: Payer: Medicare Other | Attending: Physician Assistant | Admitting: Internal Medicine

## 2023-06-20 VITALS — BP 138/86 | HR 70 | Temp 97.1°F | Resp 70 | Wt 165.0 lb

## 2023-06-20 DIAGNOSIS — C7931 Secondary malignant neoplasm of brain: Secondary | ICD-10-CM | POA: Insufficient documentation

## 2023-06-20 DIAGNOSIS — Z803 Family history of malignant neoplasm of breast: Secondary | ICD-10-CM | POA: Diagnosis not present

## 2023-06-20 DIAGNOSIS — Z87891 Personal history of nicotine dependence: Secondary | ICD-10-CM | POA: Insufficient documentation

## 2023-06-20 DIAGNOSIS — C3412 Malignant neoplasm of upper lobe, left bronchus or lung: Secondary | ICD-10-CM | POA: Insufficient documentation

## 2023-06-20 NOTE — Progress Notes (Signed)
 [facility] at [facility] [address]  [place], [place] [address] [phone]   Interval Evaluation  Date of Service: 06/20/23 Patient Name: Carolyn [name] Patient MRN: [id number] Patient DOB: 17-Feb-1955 Provider: Mamie Searles, MD  Identifying Statement:  Carolyn [name] is a 68 y.o. female with Malignant neoplasm metastatic to brain [facility]) [C79.31]   Primary Cancer:  Oncologic History: Oncology History  Adenocarcinoma, lung (HCC)  09/12/2018 Initial Diagnosis   Adenocarcinoma, lung (HCC)   10/05/2018 - 06/08/2022 Chemotherapy   Patient is on Treatment Plan : LUNG Carboplatin  (5) + Pemetrexed  (500) + Pembrolizumab  (200) D1 q21d Induction x 4 cycles / Maintenance Pemetrexed  (500) + Pembrolizumab  (200) D1 q21d     05/14/2020 Cancer Staging   Staging form: Lung, AJCC 8th Edition - Clinical: Stage IVB (cT1c, cN1, cM1c) - Signed by Marlene Simas, MD on 05/14/2020    CNS Oncologic History: 09/22/18: Completes SRS to 9 sub-cm metastases Lurena Sally) 09/02/20: SRS x1 Lurena Sally) 12/15/20: SRS R frontal (SS)  Interval History:  Carolyn [name] presents today for follow up after recent MRI brain.  No new or progressive neurologic complaints.  No severe headaches reported. Remains phsyically and mentally active.  Now on observation with Dr. Marguerita Shih.  Medications: Current Outpatient Medications on File Prior to Visit  Medication Sig Dispense Refill   acetaminophen  (TYLENOL ) 500 MG tablet Take 1,000 mg by mouth every 8 (eight) hours as needed for mild pain, fever or headache.      amLODipine  (NORVASC ) 10 MG tablet Take 1 tablet by mouth once daily 90 tablet 2   aspirin  EC 81 MG tablet Take 81 mg by mouth daily.     carvedilol (COREG) 25 MG tablet Take 1/2 (one-half) tablet by mouth twice daily 90 tablet 2   Cholecalciferol  (VITAMIN D-3) 125 MCG (5000 UT) TABS Take 5,000 Units by mouth daily.     empagliflozin  (JARDIANCE ) 25 MG TABS tablet Take 1 tablet (25  mg total) by mouth daily before breakfast. 90 tablet 3   esomeprazole (NEXIUM) 20 MG capsule Take 20 mg by mouth daily with breakfast.     ezetimibe  (ZETIA ) 10 MG tablet Take 1 tablet (10 mg total) by mouth daily. 90 tablet 3   fexofenadine (ALLEGRA) 180 MG tablet Take 180 mg by mouth daily.     fluticasone (FLONASE) 50 MCG/ACT nasal spray Place 1 spray into both nostrils daily as needed for allergies or rhinitis.     Lancets (ONETOUCH DELICA PLUS LANCET33G) MISC SMARTSIG:1 Topical 3 Times Daily     levocetirizine (XYZAL) 5 MG tablet Take 5 mg by mouth every evening.     lidocaine -prilocaine  (EMLA ) cream Apply to the Port-A-Cath site 30 to 60 minutes before chemotherapy. 30 g 2   Melatonin 1 MG CAPS Take 2 mg by mouth at bedtime as needed (for insomnia).     metFORMIN (GLUCOPHAGE) 500 MG tablet Take 500 mg by mouth daily with supper.     methocarbamol  (ROBAXIN ) 500 MG tablet Take 1 tablet (500 mg total) by mouth every 8 (eight) hours as needed for muscle spasms. 30 tablet 0   montelukast (SINGULAIR) 10 MG tablet Take 10 mg by mouth daily as needed (allergies).     nitroGLYCERIN  (NITROSTAT ) 0.4 MG SL tablet Place 1 tablet (0.4 mg total) under the tongue every 5 (five) minutes as needed for chest pain. 25 tablet 2   ONETOUCH ULTRA test strip USE 1 STRIP TO CHECK GLUCOSE THREE TIMES DAILY  Pitavastatin  Calcium  (LIVALO ) 4 MG TABS Take 1 tablet (4 mg total) by mouth daily. 90 tablet 3   Polyethyl Glycol-Propyl Glycol (SYSTANE) 0.4-0.3 % GEL ophthalmic gel Place 1 application. into both eyes every 6 (six) hours as needed (dry eyes).     Semaglutide,0.25 or 0.5MG /DOS, (OZEMPIC, 0.25 OR 0.5 MG/DOSE,) 2 MG/1.5ML SOPN Inject 0.25 mg into the skin once a week.     telmisartan (MICARDIS) 40 MG tablet Take 40 mg by mouth daily.     No current facility-administered medications on file prior to visit.    Allergies:  Allergies  Allergen Reactions   Atorvastatin  Other (See Comments)    Severe myalgia    Crestor [Rosuvastatin] Other (See Comments)    Severe myalgias   Lisinopril  Swelling    Lips and face swelled up.   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   Hypertension    nscl ca dx'd 08/07/18   Persistent headaches    h/o   Pneumonia    hx of PNA   Shortness of breath    Vaginal dryness    h/o   Past Surgical History:  Past Surgical History:  Procedure Laterality Date   ABDOMINAL HYSTERECTOMY     bone spur     CARDIAC CATHETERIZATION  06/13/2012   carpel tunnel surgery Left    COLONOSCOPY     9 years ago   CORONARY ANGIOPLASTY  06/13/2012   EYE SURGERY     cyst left eye    IR IMAGING GUIDED PORT INSERTION  10/10/2018   LEFT HEART CATHETERIZATION WITH CORONARY ANGIOGRAM N/A 06/13/2012   Procedure: LEFT HEART CATHETERIZATION WITH CORONARY ANGIOGRAM;  Surgeon: Jessica Morn, MD;  Location[facility] CATH LAB;  Service: Cardiovascular;  Laterality: N/A;   NECK SURGERY     rotator cuff surgery Right 2015   SHOULDER ARTHROSCOPY WITH ROTATOR CUFF REPAIR AND SUBACROMIAL DECOMPRESSION Left 05/14/2021   Procedure: LEFT SHOULDER ARTHROSCOPY, SUBACROMIAL DECOMPRESSION, BICEPS TENODESIS, MINI OPEN ROTATOR CUFF REPAIR;  Surgeon: Jasmine Mesi, MD;  Location: [facility] OR;  Service: Orthopedics;  Laterality: Left;   VIDEO BRONCHOSCOPY WITH ENDOBRONCHIAL NAVIGATION N/A 09/06/2018   Procedure: VIDEO BRONCHOSCOPY WITH ENDOBRONCHIAL NAVIGATION, left upper lung;   Surgeon: Hilarie Lovely, MD;  Location: [facility] OR;  Service: Thoracic;  Laterality: N/A;   VIDEO BRONCHOSCOPY WITH ENDOBRONCHIAL ULTRASOUND Left 09/06/2018   Procedure: VIDEO BRONCHOSCOPY WITH ENDOBRONCHIAL ULTRASOUND, left lung;  Surgeon: Hilarie Lovely, MD;  Location: [facility] OR;  Service: Thoracic;  Laterality: Left;   WISDOM TOOTH EXTRACTION     Social History:  Social History   Socioeconomic History   Marital status: Married    Spouse name: Not on file   Number of children: 3   Years of education: Not on file   Highest education level: Not  on file  Occupational History   Not on file  Tobacco Use   Smoking status: Former    Current packs/day: 0.00    Average packs/day: 0.5 packs/day for 30.0 years (15.0 ttl pk-yrs)    Types: Cigarettes    Start date: 02/08/1969    Quit date: 02/09/1999    Years since quitting: 24.3   Smokeless tobacco: Never  Vaping Use   Vaping status: Never Used  Substance and Sexual Activity   Alcohol use: Not Currently   Drug use: Not Currently   Sexual activity: Yes    Birth control/protection: Post-menopausal  Other Topics Concern   Not on file  Social History Narrative   Not on file   Social Drivers of Health   Financial Resource Strain: Not on file  Food Insecurity: Not on file  Transportation Needs: No Transportation Needs (08/29/2018)   PRAPARE - Administrator, Civil Service (Medical): No    Lack of Transportation (Non-Medical): No  Physical Activity: Not on file  Stress: Not on file  Social Connections: Not on file  Intimate Partner Violence: Not At Risk (08/29/2018)   Humiliation, Afraid, Rape, and Kick questionnaire    Fear of Current or Ex-Partner: No    Emotionally Abused: No    Physically Abused: No    Sexually Abused: No   Family History:  Family History  Problem Relation Age of Onset   Breast cancer Other    Diabetes Mother    Glaucoma Mother    Hypertension Mother    Hypertension Father    Asthma Father     Diabetes Sister    Diabetes Brother    Hypertension Sister     Review of Systems: Constitutional: Doesn't report fevers, chills or abnormal weight loss Eyes: Doesn't report blurriness of vision Ears, nose, mouth, throat, and face: Doesn't report sore throat Respiratory: Doesn't report cough, dyspnea or wheezes Cardiovascular: Doesn't report palpitation, chest discomfort  Gastrointestinal:  Doesn't report nausea, constipation, diarrhea GU: Doesn't report incontinence Skin: Doesn't report skin rashes Neurological: Per HPI Musculoskeletal: Doesn't report joint pain Behavioral/Psych: Doesn't report anxiety  Physical Exam: There were no vitals filed for this visit.   KPS: 90. General: Alert, cooperative, pleasant, in no acute distress Head: Normal EENT: No conjunctival injection or scleral icterus.  Lungs: Resp effort normal Cardiac: Regular rate Abdomen: Non-distended abdomen Skin: No rashes cyanosis or petechiae. Extremities: No clubbing or edema  Neurologic Exam: Mental Status: Awake, alert, attentive to examiner. Oriented to self and environment. Language is fluent with intact comprehension.  Cranial Nerves: Visual acuity is grossly normal. Visual fields are full. Extra-ocular movements intact. No ptosis. Face is symmetric Motor: Tone and bulk are normal. Power is full in both arms and legs. Reflexes are symmetric, no pathologic reflexes present.  Sensory: Intact to light touch Gait: Normal.   Labs: I have reviewed the data as listed    Component Value Date/Time   NA 140 04/11/2023 0738   NA 139 08/18/2020 0801   K 4.1 04/11/2023 0738   CL 106 04/11/2023 0738   CO2 28 04/11/2023 0738   GLUCOSE 97 04/11/2023 0738   BUN 11 04/11/2023 0738   BUN 16 08/18/2020 0801   CREATININE 1.50 (H) 04/11/2023 0738   CALCIUM  9.0 04/11/2023 0738   PROT 6.5 04/11/2023 0738   ALBUMIN 3.7 04/11/2023 0738   AST 16 04/11/2023 0738   ALT 10 04/11/2023 0738   ALKPHOS 62 04/11/2023  0738   BILITOT 0.4 04/11/2023 1191  GFRNONAA 37 (L) 04/11/2023 0738   GFRAA 37 (L) 11/12/2019 0917   GFRAA 37 (L) 11/08/2019 0900   Lab Results  Component Value Date   WBC 6.9 04/11/2023   NEUTROABS 4.4 04/11/2023   HGB 12.0 04/11/2023   HCT 38.8 04/11/2023   MCV 81.2 04/11/2023   PLT 257 04/11/2023    Imaging:  MR BRAIN W WO CONTRAST Result Date: 06/16/2023 CLINICAL DATA:  Brain/CNS neoplasm, assess treatment response. EXAM: MRI HEAD WITHOUT AND WITH CONTRAST TECHNIQUE: Multiplanar, multiecho pulse sequences of the brain and surrounding structures were obtained without and with intravenous contrast. CONTRAST:  9 cc Vueway  COMPARISON:  12/16/2022 FINDINGS: Brain: Diffusion imaging does not show any acute or subacute infarction or other cause of restricted diffusion. Chronically Oasis in the left lateral cerebellum is stable. Few nonenhancing punctate foci within the cerebral hemispheric white matter are stable. Punctate focus of enhancement within the left cerebellum series 13 axial image 26 is unchanged. Punctate enhancement adjacent to the atrium of the left lateral ventricle image 81 is stable over time. Mild enhancement associated with the treated lesion of the left temporal tip is stable over time. Stable appearance of a hemorrhagic treated lesion in the right frontal lesion with minimal marginal enhancement but no sign of progressive or residual viable disease in this location. Punctate focus of enhancement in the left occipital lobe axial image 79 is unchanged. No new or progressive lesion. No hydrocephalus.  No extra-axial collection. Vascular: Major vessels at the base of the brain show flow. Skull and upper cervical spine: Negative Sinuses/Orbits: The clear/normal Other: None IMPRESSION: Stable examination. No evidence of progressive or new disease. Stable appearance of treated lesions as outlined above. Electronically Signed   By: Bettylou Brunner M.D.   On: 06/16/2023 12:27     [facility]  Clinician Interpretation: I have personally reviewed the radiological images as listed.  My interpretation, in the context of the patient's clinical presentation, is stable disease pending official read   Assessment/Plan Malignant neoplasm metastatic to brain [facility]) [C79.31]  Carolyn [name] is clinicallly stable today.  MRI brain demonstratesresolution of focus of enhancement in left occipital lobe; this previously had overlapped with treated lesion and was likely reactive in nature.  We ask that Carolyn [name] return to clinic in 6 months following next brain MRI, or sooner as needed.  If next scan is stable we can move out to 9-12 months.  May treat headaches with PRN Tylenol  as prior.  We appreciate the opportunity to participate in the care of Carolyn [name].   All questions were answered. The patient knows to call the clinic with any problems, questions or concerns. No barriers to learning were detected.  I have spent a total of 30 minutes of face-to-face and non-face-to-face time, excluding clinical staff time, preparing to see patient, ordering tests and/or medications, counseling the patient, and independently interpreting results and communicating results to the patient/family/caregiver    Shoaib Siefker K Carlita Whitcomb, MD Medical Director of Neuro-Oncology [facility] at [facility] 06/20/23 10:18 AM

## 2023-06-21 ENCOUNTER — Other Ambulatory Visit: Payer: Self-pay | Admitting: Radiation Therapy

## 2023-06-21 ENCOUNTER — Telehealth: Payer: Self-pay | Admitting: Internal Medicine

## 2023-06-21 DIAGNOSIS — K5730 Diverticulosis of large intestine without perforation or abscess without bleeding: Secondary | ICD-10-CM | POA: Diagnosis not present

## 2023-06-21 DIAGNOSIS — Z1211 Encounter for screening for malignant neoplasm of colon: Secondary | ICD-10-CM | POA: Diagnosis not present

## 2023-06-21 DIAGNOSIS — K5900 Constipation, unspecified: Secondary | ICD-10-CM | POA: Diagnosis not present

## 2023-06-21 DIAGNOSIS — K219 Gastro-esophageal reflux disease without esophagitis: Secondary | ICD-10-CM | POA: Diagnosis not present

## 2023-06-21 NOTE — Telephone Encounter (Signed)
 Patient scheduled appointments. Patient is aware of all appointment details.

## 2023-06-28 ENCOUNTER — Inpatient Hospital Stay: Payer: Medicare Other

## 2023-06-28 ENCOUNTER — Other Ambulatory Visit: Payer: Self-pay | Admitting: Medical Oncology

## 2023-06-28 DIAGNOSIS — Z95828 Presence of other vascular implants and grafts: Secondary | ICD-10-CM

## 2023-06-28 DIAGNOSIS — C7931 Secondary malignant neoplasm of brain: Secondary | ICD-10-CM | POA: Diagnosis not present

## 2023-06-28 DIAGNOSIS — Z87891 Personal history of nicotine dependence: Secondary | ICD-10-CM | POA: Diagnosis not present

## 2023-06-28 DIAGNOSIS — Z803 Family history of malignant neoplasm of breast: Secondary | ICD-10-CM | POA: Diagnosis not present

## 2023-06-28 DIAGNOSIS — C3412 Malignant neoplasm of upper lobe, left bronchus or lung: Secondary | ICD-10-CM | POA: Diagnosis not present

## 2023-06-28 DIAGNOSIS — C3492 Malignant neoplasm of unspecified part of left bronchus or lung: Secondary | ICD-10-CM

## 2023-06-28 MED ORDER — HEPARIN SOD (PORK) LOCK FLUSH 100 UNIT/ML IV SOLN
500.0000 [IU] | Freq: Once | INTRAVENOUS | Status: AC | PRN
  Administered 2023-06-28: 500 [IU]

## 2023-06-28 MED ORDER — SODIUM CHLORIDE 0.9% FLUSH
10.0000 mL | INTRAVENOUS | Status: DC | PRN
  Administered 2023-06-28: 10 mL

## 2023-06-28 MED ORDER — LIDOCAINE-PRILOCAINE 2.5-2.5 % EX CREA: TOPICAL_CREAM | CUTANEOUS | 2 refills | Status: AC

## 2023-06-30 DIAGNOSIS — Z1211 Encounter for screening for malignant neoplasm of colon: Secondary | ICD-10-CM | POA: Diagnosis not present

## 2023-06-30 DIAGNOSIS — Z1212 Encounter for screening for malignant neoplasm of rectum: Secondary | ICD-10-CM | POA: Diagnosis not present

## 2023-07-01 DIAGNOSIS — Z780 Asymptomatic menopausal state: Secondary | ICD-10-CM | POA: Diagnosis not present

## 2023-07-01 DIAGNOSIS — Z1231 Encounter for screening mammogram for malignant neoplasm of breast: Secondary | ICD-10-CM | POA: Diagnosis not present

## 2023-07-01 DIAGNOSIS — C3490 Malignant neoplasm of unspecified part of unspecified bronchus or lung: Secondary | ICD-10-CM | POA: Diagnosis not present

## 2023-07-01 DIAGNOSIS — Z01419 Encounter for gynecological examination (general) (routine) without abnormal findings: Secondary | ICD-10-CM | POA: Diagnosis not present

## 2023-07-01 DIAGNOSIS — Z1330 Encounter for screening examination for mental health and behavioral disorders, unspecified: Secondary | ICD-10-CM | POA: Diagnosis not present

## 2023-07-01 DIAGNOSIS — Z90710 Acquired absence of both cervix and uterus: Secondary | ICD-10-CM | POA: Diagnosis not present

## 2023-07-01 DIAGNOSIS — Z1211 Encounter for screening for malignant neoplasm of colon: Secondary | ICD-10-CM | POA: Diagnosis not present

## 2023-07-01 DIAGNOSIS — Z13820 Encounter for screening for osteoporosis: Secondary | ICD-10-CM | POA: Diagnosis not present

## 2023-08-05 DIAGNOSIS — E119 Type 2 diabetes mellitus without complications: Secondary | ICD-10-CM | POA: Diagnosis not present

## 2023-08-05 DIAGNOSIS — H43393 Other vitreous opacities, bilateral: Secondary | ICD-10-CM | POA: Diagnosis not present

## 2023-08-17 ENCOUNTER — Ambulatory Visit (HOSPITAL_COMMUNITY)
Admission: RE | Admit: 2023-08-17 | Discharge: 2023-08-17 | Disposition: A | Discharge status: Home / Self Care | Source: Ambulatory Visit | Attending: Physician Assistant | Admitting: Physician Assistant

## 2023-08-17 DIAGNOSIS — C7931 Secondary malignant neoplasm of brain: Secondary | ICD-10-CM | POA: Insufficient documentation

## 2023-08-23 ENCOUNTER — Encounter: Payer: Self-pay | Admitting: Internal Medicine

## 2023-08-24 ENCOUNTER — Inpatient Hospital Stay: Attending: Physician Assistant

## 2023-08-24 DIAGNOSIS — C3412 Malignant neoplasm of upper lobe, left bronchus or lung: Secondary | ICD-10-CM | POA: Diagnosis not present

## 2023-08-24 DIAGNOSIS — Z95828 Presence of other vascular implants and grafts: Secondary | ICD-10-CM

## 2023-08-24 DIAGNOSIS — N189 Chronic kidney disease, unspecified: Secondary | ICD-10-CM | POA: Diagnosis not present

## 2023-08-24 DIAGNOSIS — C7931 Secondary malignant neoplasm of brain: Secondary | ICD-10-CM | POA: Diagnosis not present

## 2023-08-24 LAB — CBC WITH DIFFERENTIAL (CANCER CENTER ONLY)
Abs Immature Granulocytes: 0.01 K/uL (ref 0.00–0.07)
Basophils Absolute: 0.1 K/uL (ref 0.0–0.1)
Basophils Relative: 1 %
Eosinophils Absolute: 0.4 K/uL (ref 0.0–0.5)
Eosinophils Relative: 7 %
HCT: 40.2 % (ref 36.0–46.0)
Hemoglobin: 12.7 g/dL (ref 12.0–15.0)
Immature Granulocytes: 0 %
Lymphocytes Relative: 25 %
Lymphs Abs: 1.5 K/uL (ref 0.7–4.0)
MCH: 25.1 pg — ABNORMAL LOW (ref 26.0–34.0)
MCHC: 31.6 g/dL (ref 30.0–36.0)
MCV: 79.4 fL — ABNORMAL LOW (ref 80.0–100.0)
Monocytes Absolute: 0.5 K/uL (ref 0.1–1.0)
Monocytes Relative: 8 %
Neutro Abs: 3.5 K/uL (ref 1.7–7.7)
Neutrophils Relative %: 59 %
Platelet Count: 247 K/uL (ref 150–400)
RBC: 5.06 MIL/uL (ref 3.87–5.11)
RDW: 15.8 % — ABNORMAL HIGH (ref 11.5–15.5)
WBC Count: 6.1 K/uL (ref 4.0–10.5)
nRBC: 0 % (ref 0.0–0.2)

## 2023-08-24 LAB — CMP (CANCER CENTER ONLY)
ALT: 12 U/L (ref 0–44)
AST: 21 U/L (ref 15–41)
Albumin: 3.8 g/dL (ref 3.5–5.0)
Alkaline Phosphatase: 77 U/L (ref 38–126)
Anion gap: 6 (ref 5–15)
BUN: 20 mg/dL (ref 8–23)
CO2: 28 mmol/L (ref 22–32)
Calcium: 9.2 mg/dL (ref 8.9–10.3)
Chloride: 107 mmol/L (ref 98–111)
Creatinine: 1.6 mg/dL — ABNORMAL HIGH (ref 0.44–1.00)
GFR, Estimated: 35 mL/min — ABNORMAL LOW (ref >60)
Glucose, Bld: 99 mg/dL (ref 70–99)
Potassium: 4.1 mmol/L (ref 3.5–5.1)
Sodium: 141 mmol/L (ref 135–145)
Total Bilirubin: 0.3 mg/dL (ref 0.0–1.2)
Total Protein: 6.9 g/dL (ref 6.5–8.1)

## 2023-08-24 MED ORDER — SODIUM CHLORIDE 0.9% FLUSH
10.0000 mL | INTRAVENOUS | Status: DC | PRN
Start: 2023-08-24 — End: 2023-08-24
  Administered 2023-08-24: 10 mL

## 2023-08-24 MED ORDER — HEPARIN SOD (PORK) LOCK FLUSH 100 UNIT/ML IV SOLN
500.0000 [IU] | Freq: Once | INTRAVENOUS | Status: AC | PRN
  Administered 2023-08-24: 500 [IU]

## 2023-09-06 ENCOUNTER — Inpatient Hospital Stay (HOSPITAL_BASED_OUTPATIENT_CLINIC_OR_DEPARTMENT_OTHER): Admitting: Internal Medicine

## 2023-09-06 VITALS — BP 122/59 | HR 62 | Temp 97.8°F | Resp 16 | Ht 61.0 in | Wt 164.0 lb

## 2023-09-06 DIAGNOSIS — N189 Chronic kidney disease, unspecified: Secondary | ICD-10-CM | POA: Diagnosis not present

## 2023-09-06 DIAGNOSIS — C7931 Secondary malignant neoplasm of brain: Secondary | ICD-10-CM | POA: Diagnosis not present

## 2023-09-06 DIAGNOSIS — C3490 Malignant neoplasm of unspecified part of unspecified bronchus or lung: Secondary | ICD-10-CM | POA: Diagnosis not present

## 2023-09-06 DIAGNOSIS — C3412 Malignant neoplasm of upper lobe, left bronchus or lung: Secondary | ICD-10-CM | POA: Diagnosis not present

## 2023-09-06 NOTE — Progress Notes (Signed)
 [facility] [phone]   [phone]  OFFICE PROGRESS NOTE  Royden Ronal Czar, FNP [address] [place] [address]  DIAGNOSIS: Stage IV (T1c, N1, M1c ) non-small cell lung cancer, adenocarcinoma diagnosed in July 2020 and presented with left upper lobe lung nodule in addition to right upper lobe lung nodule with left hilar lymphadenopathy as well as multiple brain metastasis.  Biomarker Findings Microsatellite status - MS-Stable Tumor Mutational Burden - 4 Muts/Mb Genomic Findings For a complete list of the genes assayed, please refer to the Appendix. ATM G204* KRAS G12C PDGFRA P122T SMO R199W 7 Disease relevant genes with no reportable alterations: ALK, BRAF, EGFR, ERBB2, MET, RET, ROS1  PDL 1 expression was 1%  PRIOR THERAPY:  1) SRS to multiple brain metastasis under the care of Dr. Izell. 2) SRS to a 4 mm new brain metastasis under the care of Dr. Izell and Dr. Onetha on 09/02/2020. 3) Systemic chemotherapy with carboplatin  for AUC of 5, Alimta  500 mg/M2 and Keytruda  200 mg IV every 3 weeks.  First dose October 05, 2018.  Status post 65 cycles.  Starting from cycle #5 the patient will be on treatment with maintenance Alimta  500 mg/M2 and Keytruda  200 mg IV every 3 weeks.  She has been on treatment with single agent Keytruda  starting from cycle #9 secondary to renal insufficiency.  Last dose was given June 08, 2022 after the patient has been on treatment for more than 5 years with no evidence of progression.  CURRENT THERAPY: Observation.  INTERVAL HISTORY: Carolyn [name] 68 y.o. female returns to the clinic today for follow-up visit. Discussed the use of AI scribe software for clinical note transcription with the patient, who gave verbal consent to proceed.  History of Present Illness Carolyn [name] is a 68 year old female with stage four non-small cell lung cancer who presents for routine follow-up.  She  was diagnosed with stage four non-small cell lung cancer, adenocarcinoma, in July 2020, characterized by a KRAS G12C mutation and PD-L1 expression of one percent. She has undergone stereotactic radiosurgery and has been receiving chemotherapy with carboplatin  and pemetrexed  every three weeks for four cycles, and has started the fifth cycle.  She has no new issues or complaints since her last visit. No chest pain, shortness of breath, cough, nausea, vomiting, or diarrhea. She has experienced intentional weight loss, which she attributes to efforts encouraged by her cardiologist.  Her kidney function was recently measured at 1.5.  She is scheduled for a follow-up MRI for brain metastasis in November.    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  MEDICATIONS:  Current Outpatient Medications  Medication Sig Dispense Refill   acetaminophen  (TYLENOL ) 500 MG tablet Take 1,000 mg by mouth every 8 (eight) hours as needed for mild pain, fever or headache.      amLODipine  (NORVASC ) 10 MG tablet Take 1 tablet by mouth once daily 90 tablet 2   aspirin  EC 81 MG tablet Take 81 mg by mouth daily.     carvedilol (COREG) 25 MG tablet Take 1/2 (one-half) tablet by mouth twice daily 90 tablet 2    Cholecalciferol  (VITAMIN D-3) 125 MCG (5000 UT) TABS Take 5,000 Units by mouth daily.     empagliflozin  (JARDIANCE ) 25 MG TABS tablet Take 1 tablet (25 mg total) by mouth daily before breakfast. 90 tablet 3   esomeprazole (NEXIUM) 20 MG capsule Take 20 mg by mouth daily with breakfast.     ezetimibe  (ZETIA ) 10 MG tablet Take 1 tablet (10 mg total) by mouth daily. 90 tablet 3   fexofenadine (ALLEGRA) 180 MG tablet Take 180 mg by mouth daily.     fluticasone (FLONASE) 50 MCG/ACT nasal spray Place 1 spray into both nostrils daily as needed for allergies or rhinitis.     Lancets (ONETOUCH DELICA PLUS LANCET33G) MISC SMARTSIG:1 Topical 3 Times Daily     levocetirizine (XYZAL) 5 MG tablet Take 5 mg by mouth every evening.     lidocaine -prilocaine  (EMLA ) cream Apply to the Port-A-Cath site 30 to 60 minutes before chemotherapy. 30 g 2   Melatonin 1 MG CAPS Take 2 mg by mouth at bedtime as needed (for insomnia).     metFORMIN (GLUCOPHAGE) 500 MG tablet Take 500 mg by mouth daily with supper.     methocarbamol  (ROBAXIN ) 500 MG tablet Take 1 tablet (500 mg total) by mouth every 8 (eight) hours as needed for muscle spasms. 30 tablet 0   montelukast (SINGULAIR) 10 MG tablet Take 10 mg by mouth daily as needed (allergies).     nitroGLYCERIN  (NITROSTAT ) 0.4 MG SL tablet Place 1 tablet (0.4 mg total) under the tongue every 5 (five) minutes as needed for chest pain. 25 tablet 2   ONETOUCH ULTRA test strip USE 1 STRIP TO CHECK GLUCOSE THREE TIMES DAILY     Pitavastatin  Calcium  (LIVALO ) 4 MG TABS Take 1 tablet (4 mg total) by mouth daily. 90 tablet 3   Polyethyl Glycol-Propyl Glycol (SYSTANE) 0.4-0.3 % GEL ophthalmic gel Place 1 application. into both eyes every 6 (six) hours as needed (dry eyes).     Semaglutide,0.25 or 0.5MG /DOS, (OZEMPIC, 0.25 OR 0.5 MG/DOSE,) 2 MG/1.5ML SOPN Inject 0.25 mg into the skin once a week.     telmisartan (MICARDIS) 40 MG tablet Take 40 mg by mouth daily.     No current  facility-administered medications for this visit.    SURGICAL HISTORY:  Past Surgical History:  Procedure Laterality Date   ABDOMINAL HYSTERECTOMY     bone spur     CARDIAC CATHETERIZATION  06/13/2012   carpel tunnel surgery Left    COLONOSCOPY     9 years ago   CORONARY ANGIOPLASTY  06/13/2012   EYE SURGERY     cyst left eye    IR IMAGING GUIDED PORT INSERTION  10/10/2018   LEFT HEART CATHETERIZATION WITH CORONARY ANGIOGRAM N/A 06/13/2012   Procedure: LEFT HEART CATHETERIZATION WITH CORONARY ANGIOGRAM;  Surgeon: Erick JONELLE Bergamo, MD;  Location: [facility] CATH LAB;  Service: Cardiovascular;  Laterality: N/A;  NECK SURGERY     rotator cuff surgery Right 2015   SHOULDER ARTHROSCOPY WITH ROTATOR CUFF REPAIR AND SUBACROMIAL DECOMPRESSION Left 05/14/2021   Procedure: LEFT SHOULDER ARTHROSCOPY, SUBACROMIAL DECOMPRESSION, BICEPS TENODESIS, MINI OPEN ROTATOR CUFF REPAIR;  Surgeon: Addie Cordella Hamilton, MD;  Location: [facility] OR;  Service: Orthopedics;  Laterality: Left;   VIDEO BRONCHOSCOPY WITH ENDOBRONCHIAL NAVIGATION N/A 09/06/2018   Procedure: VIDEO BRONCHOSCOPY WITH ENDOBRONCHIAL NAVIGATION, left upper lung;  Surgeon: Shyrl Linnie KIDD, MD;  Location: [facility] OR;  Service: Thoracic;  Laterality: N/A;   VIDEO BRONCHOSCOPY WITH ENDOBRONCHIAL ULTRASOUND Left 09/06/2018   Procedure: VIDEO BRONCHOSCOPY WITH ENDOBRONCHIAL ULTRASOUND, left lung;  Surgeon: Shyrl Linnie KIDD, MD;  Location: [facility] OR;  Service: Thoracic;  Laterality: Left;   WISDOM TOOTH EXTRACTION      REVIEW OF SYSTEMS:  A comprehensive review of systems was negative.   PHYSICAL EXAMINATION: General appearance: alert, cooperative, and no distress Head: Normocephalic, without obvious abnormality, atraumatic Neck: no adenopathy, no JVD, supple, symmetrical, trachea midline, and thyroid  not enlarged, symmetric, no tenderness/mass/nodules Lymph nodes: Cervical, supraclavicular, and axillary nodes normal. Resp: clear to auscultation bilaterally Back:  symmetric, no curvature. ROM normal. No CVA tenderness. Cardio: regular rate and rhythm, S1, S2 normal, no murmur, click, rub or gallop GI: soft, non-tender; bowel sounds normal; no masses,  no organomegaly Extremities: extremities normal, atraumatic, no cyanosis or edema  ECOG PERFORMANCE STATUS: 1 - Symptomatic but completely ambulatory  Blood pressure (!) 122/59, pulse 62, temperature 97.8 F (36.6 C), temperature source Temporal, resp. rate 16, height 5' 1 (1.549 m), weight 164 lb (74.4 kg), SpO2 100%.  LABORATORY DATA: Lab Results  Component Value Date   WBC 6.1 08/24/2023   HGB 12.7 08/24/2023   HCT 40.2 08/24/2023   MCV 79.4 (L) 08/24/2023   PLT 247 08/24/2023      Chemistry      Component Value Date/Time   NA 141 08/24/2023 0748   NA 139 08/18/2020 0801   K 4.1 08/24/2023 0748   CL 107 08/24/2023 0748   CO2 28 08/24/2023 0748   BUN 20 08/24/2023 0748   BUN 16 08/18/2020 0801   CREATININE 1.60 (H) 08/24/2023 0748      Component Value Date/Time   CALCIUM  9.2 08/24/2023 0748   ALKPHOS 77 08/24/2023 0748   AST 21 08/24/2023 0748   ALT 12 08/24/2023 0748   BILITOT 0.3 08/24/2023 0748       RADIOGRAPHIC STUDIES: CT CHEST ABDOMEN PELVIS WO CONTRAST Result Date: 08/18/2023 EXAM:  CT CHEST ABDOMEN PELVIS WITHOUT IV CONTRAST INDICATION:  Metastatic disease evaluation TECHNIQUE: Spiral CT scanning was performed through the chest, abdomen and pelvis with no contrast. COMPARISON: 04/11/2023 FINDINGS: Postsurgical changes are present in the inferior lingula. There is a 7 x 7 mm nodular opacity which is poorly marginated. This has a stable appearance. There is mild linear scarring peripheral and superior to the nodule. Mild subpleural scarring is again noted in the lateral right upper lobe. The lungs are otherwise clear. There is no developing pulmonary mass. The cardiac size is within normal limits. A small pericardial effusion is present, unchanged. There is no thoracic aortic  aneurysm. The esophagus and thyroid  glands have a normal appearance. There is no mass or adenopathy in the chest. No pleural effusion is present. There is no significant abnormality identified in the liver, spleen, pancreas and gallbladder. No calculus, obstruction, or soft tissue mass is present involving the kidneys. There is stable renal cortical scarring. There are no adrenal  masses. The abdominal bowel loops are unremarkable except for mild stool retention. There is no evidence of ascites or adenopathy. No abdominal aortic aneurysm is present. The uterus is surgically absent. The bladder has a normal appearance. There is no adnexal mass. There is no inguinal hernia. The pelvic bowel loops have a normal appearance. The appendix has a normal appearance. There is no fracture or bone destruction. IMPRESSION: 1. No evidence of acute process. No evidence of tumor recurrence or metastatic disease. 2. Stable postsurgical changes in the inferior lingula. Persistent 7 mm nodular opacity. Please note that CT scanning at this site utilizes multiple dose reduction techniques, including automatic exposure control, adjustment of the MAA and/or KVP according to the patient's size, and use of iterative reconstruction. Electronically signed by: Eddy Oar MD 08/18/2023 01:01 PM EDT RP Workstation: [id number]     ASSESSMENT AND PLAN: This is a very pleasant 68 years old African-American female with likely stage IV (T1c, N1, M1C) non-small cell lung cancer, adenocarcinoma with positive KRAS G12C mutation diagnosed in July 2020 and presented with left upper lobe lung nodule in addition to right upper lobe pulmonary nodule as well as left hilar adenopathy and metastatic lesion to the brain. Molecular studies showed no actionable mutation and PDL 1 expression was 1%. The patient underwent SBRT to the metastatic brain lesion under the care of Dr. Izell. The patient is currently undergoing systemic chemotherapy with  carboplatin  for AUC of 5, Alimta  500 mg/M2 and Keytruda  200 mg IV every 3 weeks is status post 65 cycles.  Starting from cycle #5 she is on maintenance treatment with Alimta  and Keytruda  every 3 weeks with only Keytruda  on the last few cycles because of the renal insufficiency. For the brain metastasis, she is followed by Dr. Buckley and she is supposed to have another MRI of the brain in July 2024. For the renal insufficiency, she is followed by nephrology. She will have Port-A-Cath flush every 6 weeks. She had repeat CT scan of the chest, abdomen and pelvis performed recently.  I personally and independently reviewed the scan and discussed the result with the patient today.  Her scan showed no concerning findings for disease progression. Assessment and Plan Assessment & Plan Stage IV non-small cell lung cancer, adenocarcinoma with KRAS G12C mutation Stage IV non-small cell lung cancer, adenocarcinoma with KRAS G12C mutation, diagnosed in July 2020. Currently on maintenance therapy with Alimta  and Keytruda . Recent CT scan of chest, abdomen, and pelvis shows no evidence of disease progression. Brain metastasis being monitored by neuro-oncologist with scheduled MRI in November. - Continue Alimta  and Keytruda  maintenance therapy - Continue follow-up with neuro-oncologist for brain metastasis - Schedule brain MRI in November  Chronic kidney disease, unspecified stage Chronic kidney disease with current kidney function at 1.5, which is an improvement from previous levels. She was advised to call immediately if she has any other concerning symptoms in the interval.   The patient voices understanding of current disease status and treatment options and is in agreement with the current care plan. All questions were answered. The patient knows to call the clinic with any problems, questions or concerns. We can certainly see the patient much sooner if necessary. The total time spent in the appointment was  20 minutes.  Disclaimer: This note was dictated with voice recognition software. Similar sounding words can inadvertently be transcribed and may not be corrected upon review.

## 2023-09-08 ENCOUNTER — Telehealth: Payer: Self-pay | Admitting: Internal Medicine

## 2023-09-08 NOTE — Telephone Encounter (Signed)
 Left the patient a voicemail with the scheduled appointment details.

## 2023-10-27 DIAGNOSIS — M1990 Unspecified osteoarthritis, unspecified site: Secondary | ICD-10-CM | POA: Diagnosis not present

## 2023-10-27 DIAGNOSIS — M359 Systemic involvement of connective tissue, unspecified: Secondary | ICD-10-CM | POA: Diagnosis not present

## 2023-10-27 DIAGNOSIS — M25512 Pain in left shoulder: Secondary | ICD-10-CM | POA: Diagnosis not present

## 2023-10-27 DIAGNOSIS — M25511 Pain in right shoulder: Secondary | ICD-10-CM | POA: Diagnosis not present

## 2023-11-02 DIAGNOSIS — Z23 Encounter for immunization: Secondary | ICD-10-CM | POA: Diagnosis not present

## 2023-11-07 ENCOUNTER — Ambulatory Visit (INDEPENDENT_AMBULATORY_CARE_PROVIDER_SITE_OTHER): Admitting: Orthopedic Surgery

## 2023-11-07 ENCOUNTER — Other Ambulatory Visit: Payer: Self-pay

## 2023-11-07 ENCOUNTER — Encounter: Payer: Self-pay | Admitting: Orthopedic Surgery

## 2023-11-07 DIAGNOSIS — M75122 Complete rotator cuff tear or rupture of left shoulder, not specified as traumatic: Secondary | ICD-10-CM

## 2023-11-07 NOTE — Progress Notes (Signed)
 Office Visit Note   Patient: Carolyn [name]           Date of Birth: Jul 20, 1955           MRN: [id number] Visit Date: 11/07/2023 Requested by: Royden Ronal Czar, FNP [address] [place],  [place] [address] PCP: Royden Ronal Czar, FNP  Subjective: Chief Complaint  Patient presents with   Left Shoulder - Pain    HPI: Carolyn [name] is a 68 y.o. female who presents to the office reporting bilateral shoulder pain left equal to right.  She has a history of left shoulder infraspinatus and supraspinatus repair done 2 years ago.  Pain comes and goes.  Last week her pain was significant.  Went to primary care provider and was told radiographs were negative.  She was also told she may have a little bit of rheumatoid arthritis in her hand.  Overall she is little better this week than last week.  Takes occasional Tylenol  for symptoms.  Denies any numbness and tingling or neck pain.  No radicular symptoms..                ROS: All systems reviewed are negative as they relate to the chief complaint within the history of present illness.  Patient denies fevers or chills.  Assessment & Plan: Visit Diagnoses:  1. Complete tear of left rotator cuff, unspecified whether traumatic     Plan: Impression is bilateral shoulder pain with left shoulder looking like it may be occult arthritis versus rotator cuff bursitis versus partial recurrent tearing.  Slightly weaker with external rotation of the left compared to the right.  Also has some crepitus on the right-hand side which could indicate some rotator cuff pathology as well.  Overall she has excellent forward flexion above shoulder level.  Plan for her would be injections into the right shoulder versus left shoulder followed by the other shoulder in about 10 days.  She does have diabetes so I would not want to inject both on the same day.  Follow-Up Instructions: No follow-ups on file.   Orders:  No orders of the defined types  were placed in this encounter.  No orders of the defined types were placed in this encounter.     Procedures: No procedures performed   Clinical Data: No additional findings.  Objective: Vital Signs: There were no vitals taken for this visit.  Physical Exam:  Constitutional: Patient appears well-developed HEENT:  Head: Normocephalic Eyes:EOM are normal Neck: Normal range of motion Cardiovascular: Normal rate Pulmonary/chest: Effort normal Neurologic: Patient is alert Skin: Skin is warm Psychiatric: Patient has normal mood and affect  Ortho Exam: Ortho exam demonstrates bilateral shoulder range of motion 45/95/160.  She does have 5+ out of 5 external rotation strength on the right 5 out of 5 on the left.  Subscap strength is 5+ out of 5 bilaterally.  Little bit of crepitus bilaterally with passive range of motion at 90 degrees of abduction.  No discrete AC joint tenderness.  Cervical spine range of motion is good.  Motor or sensory function to the hand also intact bilaterally with palpable radial pulse bilaterally.  Specialty Comments:  No specialty comments available.  Imaging: No results found.   PMFS History: Patient Active Problem List   Diagnosis Date Noted   Sinusitis 03/16/2022   Complete tear of left rotator cuff    Biceps tendonitis, left    Degenerative superior labral anterior-to-posterior (SLAP) tear of left shoulder  Elevated serum creatinine 04/12/2019   Pyelonephritis 02/08/2019   Port-A-Cath in place 10/19/2018   Encounter for antineoplastic chemotherapy 09/28/2018   Encounter for antineoplastic immunotherapy 09/28/2018   Goals of care, counseling/discussion 09/28/2018   Adenocarcinoma, lung (HCC) 09/12/2018   Malignant neoplasm metastatic to brain [facility]) 08/30/2018   Mass of upper lobe of left lung 08/17/2018   Angina pectoris 06/14/2012   CAD (coronary artery disease), native coronary artery 06/14/2012   S/P PTCA (percutaneous transluminal  coronary angioplasty) 06/14/2012   Hyperlipidemia 06/14/2012   Essential hypertension 06/14/2012   Pre-diabetes 06/14/2012   Past Medical History:  Diagnosis Date   Anginal pain     with exertion    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    Asthma    h/o   Brain metastases 08/2018   Chronic kidney disease    Coronary artery disease    Diabetes mellitus without complication (HCC)    Type II   GERD (gastroesophageal reflux disease)    H/O hiatal hernia    Heart murmur    History of radiation therapy 09/22/2018   stereotactic radiosurgery    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   Hypertension    nscl ca dx'd 08/07/18   Persistent headaches    h/o   Pneumonia    hx of PNA   Shortness of breath    Vaginal dryness    h/o    Family History  Problem Relation Age of Onset   Breast cancer Other    Diabetes Mother    Glaucoma Mother    Hypertension Mother    Hypertension Father    Asthma Father    Diabetes Sister    Diabetes Brother    Hypertension Sister     Past Surgical History:  Procedure Laterality Date   ABDOMINAL HYSTERECTOMY     bone spur     CARDIAC CATHETERIZATION  06/13/2012   carpel tunnel surgery Left    COLONOSCOPY     9 years ago   CORONARY ANGIOPLASTY  06/13/2012   EYE SURGERY     cyst left eye    IR IMAGING GUIDED PORT INSERTION  10/10/2018   LEFT HEART CATHETERIZATION WITH CORONARY ANGIOGRAM N/A 06/13/2012   Procedure: LEFT HEART CATHETERIZATION WITH CORONARY ANGIOGRAM;  Surgeon: Erick JONELLE Bergamo, MD;  Location: [facility] CATH LAB;  Service: Cardiovascular;  Laterality: N/A;   NECK SURGERY     rotator cuff surgery Right 2015   SHOULDER ARTHROSCOPY WITH ROTATOR CUFF REPAIR AND SUBACROMIAL DECOMPRESSION Left 05/14/2021   Procedure: LEFT SHOULDER ARTHROSCOPY, SUBACROMIAL DECOMPRESSION, BICEPS TENODESIS, MINI OPEN ROTATOR CUFF REPAIR;  Surgeon: Addie Cordella Hamilton, MD;  Location: [facility] OR;  Service: Orthopedics;  Laterality: Left;   VIDEO BRONCHOSCOPY WITH ENDOBRONCHIAL NAVIGATION N/A  09/06/2018   Procedure: VIDEO BRONCHOSCOPY WITH ENDOBRONCHIAL NAVIGATION, left upper lung;  Surgeon: Shyrl Linnie KIDD, MD;  Location: [facility] OR;  Service: Thoracic;  Laterality: N/A;   VIDEO BRONCHOSCOPY WITH ENDOBRONCHIAL ULTRASOUND Left 09/06/2018   Procedure: VIDEO BRONCHOSCOPY WITH ENDOBRONCHIAL ULTRASOUND, left lung;  Surgeon: Shyrl Linnie KIDD, MD;  Location: [facility] OR;  Service: Thoracic;  Laterality: Left;   WISDOM TOOTH EXTRACTION     Social History   Occupational History   Not on file  Tobacco Use   Smoking status: Former    Current packs/day: 0.00    Average packs/day: 0.5 packs/day for 30.0 years (15.0 ttl pk-yrs)    Types: Cigarettes    Start date: 02/08/1969    Quit  date: 02/09/1999    Years since quitting: 24.7   Smokeless tobacco: Never  Vaping Use   Vaping status: Never Used  Substance and Sexual Activity   Alcohol use: Not Currently   Drug use: Not Currently   Sexual activity: Yes    Birth control/protection: Post-menopausal

## 2023-11-09 ENCOUNTER — Emergency Department (HOSPITAL_COMMUNITY)

## 2023-11-09 ENCOUNTER — Encounter (HOSPITAL_COMMUNITY): Payer: Self-pay

## 2023-11-09 ENCOUNTER — Other Ambulatory Visit: Payer: Self-pay

## 2023-11-09 ENCOUNTER — Emergency Department (HOSPITAL_COMMUNITY)
Admission: EM | Admit: 2023-11-09 | Discharge: 2023-11-09 | Disposition: A | Discharge status: Home / Self Care | Attending: Emergency Medicine | Admitting: Emergency Medicine

## 2023-11-09 DIAGNOSIS — Z7982 Long term (current) use of aspirin: Secondary | ICD-10-CM | POA: Insufficient documentation

## 2023-11-09 DIAGNOSIS — I2510 Atherosclerotic heart disease of native coronary artery without angina pectoris: Secondary | ICD-10-CM | POA: Diagnosis not present

## 2023-11-09 DIAGNOSIS — R079 Chest pain, unspecified: Secondary | ICD-10-CM | POA: Diagnosis not present

## 2023-11-09 DIAGNOSIS — R0789 Other chest pain: Secondary | ICD-10-CM | POA: Diagnosis not present

## 2023-11-09 LAB — COMPREHENSIVE METABOLIC PANEL WITH GFR
ALT: 16 U/L (ref 0–44)
AST: 25 U/L (ref 15–41)
Albumin: 4.1 g/dL (ref 3.5–5.0)
Alkaline Phosphatase: 88 U/L (ref 38–126)
Anion gap: 12 (ref 5–15)
BUN: 19 mg/dL (ref 8–23)
CO2: 24 mmol/L (ref 22–32)
Calcium: 9.6 mg/dL (ref 8.9–10.3)
Chloride: 104 mmol/L (ref 98–111)
Creatinine, Ser: 1.55 mg/dL — ABNORMAL HIGH (ref 0.44–1.00)
GFR, Estimated: 36 mL/min — ABNORMAL LOW (ref >60)
Glucose, Bld: 101 mg/dL — ABNORMAL HIGH (ref 70–99)
Potassium: 4.3 mmol/L (ref 3.5–5.1)
Sodium: 141 mmol/L (ref 135–145)
Total Bilirubin: 0.4 mg/dL (ref 0.0–1.2)
Total Protein: 7.1 g/dL (ref 6.5–8.1)

## 2023-11-09 LAB — CBC WITH DIFFERENTIAL/PLATELET
Abs Immature Granulocytes: 0.01 K/uL (ref 0.00–0.07)
Basophils Absolute: 0.1 K/uL (ref 0.0–0.1)
Basophils Relative: 1 %
Eosinophils Absolute: 0.4 K/uL (ref 0.0–0.5)
Eosinophils Relative: 7 %
HCT: 43.7 % (ref 36.0–46.0)
Hemoglobin: 13.3 g/dL (ref 12.0–15.0)
Immature Granulocytes: 0 %
Lymphocytes Relative: 22 %
Lymphs Abs: 1.4 K/uL (ref 0.7–4.0)
MCH: 24.9 pg — ABNORMAL LOW (ref 26.0–34.0)
MCHC: 30.4 g/dL (ref 30.0–36.0)
MCV: 81.7 fL (ref 80.0–100.0)
Monocytes Absolute: 0.5 K/uL (ref 0.1–1.0)
Monocytes Relative: 8 %
Neutro Abs: 3.9 K/uL (ref 1.7–7.7)
Neutrophils Relative %: 62 %
Platelets: 269 K/uL (ref 150–400)
RBC: 5.35 MIL/uL — ABNORMAL HIGH (ref 3.87–5.11)
RDW: 15.9 % — ABNORMAL HIGH (ref 11.5–15.5)
WBC: 6.4 K/uL (ref 4.0–10.5)
nRBC: 0 % (ref 0.0–0.2)

## 2023-11-09 LAB — TROPONIN T, HIGH SENSITIVITY
Troponin T High Sensitivity: <15 ng/L (ref 0–19)
Troponin T High Sensitivity: <15 ng/L (ref 0–19)

## 2023-11-09 NOTE — ED Notes (Signed)
 Patient transported to X-ray

## 2023-11-09 NOTE — ED Provider Notes (Signed)
 [place] EMERGENCY DEPARTMENT AT [facility] Provider Note   CSN: [id number] Arrival date & time: 11/09/23  9064     Patient presents with: Arm Pain   Carolyn [name] is a 68 y.o. female.   Patient states she has picked up some frozen chicken recently and has been having left arm pain and today she had some chest discomfort.  She has a history of coronary artery disease  The history is provided by the patient and medical records. No language interpreter was used.  Arm Pain This is a recurrent problem. The current episode started 6 to 12 hours ago. The problem occurs constantly. The problem has not changed since onset.Associated symptoms include chest pain. Pertinent negatives include no abdominal pain and no headaches. Nothing aggravates the symptoms. Nothing relieves the symptoms. She has tried nothing for the symptoms.       Prior to Admission medications   Medication Sig Start Date End Date Taking? Authorizing Provider  acetaminophen  (TYLENOL ) 500 MG tablet Take 1,000 mg by mouth every 8 (eight) hours as needed for mild pain, fever or headache.     [provider]  amLODipine  (NORVASC ) 10 MG tablet Take 1 tablet by mouth once daily 12/29/22   Ganji, Jay, MD  aspirin  EC 81 MG tablet Take 81 mg by mouth daily.    [provider]  carvedilol (COREG) 25 MG tablet Take 1/2 (one-half) tablet by mouth twice daily 03/28/23   Ladona Heinz, MD  Cholecalciferol  (VITAMIN D-3) 125 MCG (5000 UT) TABS Take 5,000 Units by mouth daily.    [provider]  empagliflozin  (JARDIANCE ) 25 MG TABS tablet Take 1 tablet (25 mg total) by mouth daily before breakfast. 11/24/22   Ladona Heinz, MD  esomeprazole (NEXIUM) 20 MG capsule Take 20 mg by mouth daily with breakfast.    [provider]  ezetimibe  (ZETIA ) 10 MG tablet Take 1 tablet (10 mg total) by mouth daily. 11/24/22   Ladona Heinz, MD  fexofenadine (ALLEGRA) 180 MG tablet Take 180 mg by mouth daily.  02/20/20   [provider]  fluticasone (FLONASE) 50 MCG/ACT nasal spray Place 1 spray into both nostrils daily as needed for allergies or rhinitis.    [provider]  Lancets [facility] DELICA PLUS [place]) MISC SMARTSIG:1 Topical 3 Times Daily 08/01/19   [provider]  levocetirizine (XYZAL) 5 MG tablet Take 5 mg by mouth every evening.    [provider]  lidocaine -prilocaine  (EMLA ) cream Apply to the Port-A-Cath site 30 to 60 minutes before chemotherapy. 06/28/23   Sherrod Sherrod, MD  Melatonin 1 MG CAPS Take 2 mg by mouth at bedtime as needed (for insomnia).    [provider]  metFORMIN (GLUCOPHAGE) 500 MG tablet Take 500 mg by mouth daily with supper.    [provider]  methocarbamol  (ROBAXIN ) 500 MG tablet Take 1 tablet (500 mg total) by mouth every 8 (eight) hours as needed for muscle spasms. 05/14/21   Addie Cordella Hamilton, MD  montelukast (SINGULAIR) 10 MG tablet Take 10 mg by mouth daily as needed (allergies). 04/29/21   [provider]  nitroGLYCERIN  (NITROSTAT ) 0.4 MG SL tablet Place 1 tablet (0.4 mg total) under the tongue every 5 (five) minutes as needed for chest pain. 03/09/19   Ladona Heinz, MD  [facility] ULTRA test strip USE 1 STRIP TO CHECK GLUCOSE THREE TIMES DAILY 08/10/18   [provider]  Pitavastatin  Calcium  (LIVALO ) 4 MG TABS Take 1 tablet (4 mg  total) by mouth daily. 11/03/22   Ladona Heinz, MD  Polyethyl Glycol-Propyl Glycol (SYSTANE) 0.4-0.3 % GEL ophthalmic gel Place 1 application. into both eyes every 6 (six) hours as needed (dry eyes).    [provider]  Semaglutide,0.25 or 0.5MG /DOS, (OZEMPIC, 0.25 OR 0.5 MG/DOSE,) 2 MG/1.5ML SOPN Inject 0.25 mg into the skin once a week.    [provider]  telmisartan (MICARDIS) 40 MG tablet Take 40 mg by mouth daily.    [provider]    Allergies: Atorvastatin , Crestor [rosuvastatin], Lisinopril , Augmentin  [amoxicillin -pot clavulanate],  and Cephalexin    Review of Systems  Constitutional:  Negative for appetite change and fatigue.  HENT:  Negative for congestion, ear discharge and sinus pressure.   Eyes:  Negative for discharge.  Respiratory:  Negative for cough.   Cardiovascular:  Positive for chest pain.  Gastrointestinal:  Negative for abdominal pain and diarrhea.  Genitourinary:  Negative for frequency and hematuria.  Musculoskeletal:  Negative for back pain.  Skin:  Negative for rash.  Neurological:  Negative for seizures and headaches.  Psychiatric/Behavioral:  Negative for hallucinations.     Updated Vital Signs BP 135/72 (BP Location: Right Arm)   Pulse 67   Temp 97.9 F (36.6 C)   Resp 16   Ht 5' 1 (1.549 m)   Wt 75.3 kg   SpO2 99%   BMI 31.37 kg/m   Physical Exam Vitals and nursing note reviewed.  Constitutional:      Appearance: Normal appearance. She is well-developed.  HENT:     Head: Normocephalic.     Nose: Nose normal.  Eyes:     General: No scleral icterus.    Conjunctiva/sclera: Conjunctivae normal.  Neck:     Thyroid : No thyromegaly.  Cardiovascular:     Rate and Rhythm: Normal rate and regular rhythm.     Heart sounds: No murmur heard.    No friction rub. No gallop.  Pulmonary:     Breath sounds: No stridor. No wheezing or rales.  Chest:     Chest wall: No tenderness.  Abdominal:     General: There is no distension.     Tenderness: There is no abdominal tenderness. There is no rebound.  Musculoskeletal:        General: Normal range of motion.     Cervical back: Neck supple.  Lymphadenopathy:     Cervical: No cervical adenopathy.  Skin:    Findings: No erythema or rash.  Neurological:     Mental Status: She is alert and oriented to person, place, and time.     Motor: No abnormal muscle tone.     Coordination: Coordination normal.  Psychiatric:        Behavior: Behavior normal.     (all labs ordered are listed, but only abnormal results are displayed) Labs  Reviewed  CBC WITH DIFFERENTIAL/PLATELET - Abnormal; Notable for the following components:      Result Value   RBC 5.35 (*)    MCH 24.9 (*)    RDW 15.9 (*)    All other components within normal limits  COMPREHENSIVE METABOLIC PANEL WITH GFR - Abnormal; Notable for the following components:   Glucose, Bld 101 (*)    Creatinine, Ser 1.55 (*)    GFR, Estimated 36 (*)    All other components within normal limits  TROPONIN T, HIGH SENSITIVITY  TROPONIN T, HIGH SENSITIVITY    EKG: EKG Interpretation Date/Time:  Wednesday November 09 2023 09:56:43 EDT Ventricular Rate:  67 PR Interval:  160 QRS Duration:  82 QT Interval:  370 QTC Calculation: 390 R Axis:   80  Text Interpretation: Normal sinus rhythm Nonspecific T wave abnormality Abnormal ECG When compared with ECG of 23-Nov-2022 10:00, Previous ECG has undetermined rhythm, needs review Confirmed by Suzette Pac [phone]) on 11/09/2023 2:25:45 PM  Radiology: DG Chest 2 View Result Date: 11/09/2023 CLINICAL DATA:  Chest pain EXAM: CHEST - 2 VIEW COMPARISON:  Chest x-ray performed December 18, 2021 FINDINGS: Right-sided port. Heart mediastinum are unchanged. No discrete lobar infiltrate. No pleural effusion or pneumothorax. IMPRESSION: No active cardiopulmonary disease. Electronically Signed   By: Maude Naegeli M.D.   On: 11/09/2023 10:11     Procedures   Medications Ordered in the ED - No data to display                                  Medical Decision Making Amount and/or Complexity of Data Reviewed Labs: ordered. Radiology: ordered.   Patient with atypical chest pain.  Normal troponins with nonacute EKG.  She will follow-up with cards     Final diagnoses:  Atypical chest pain    ED Discharge Orders     None          Suzette Pac, MD 11/09/23 1643

## 2023-11-09 NOTE — ED Triage Notes (Signed)
 Pt arrived via POV c/o left arm pain for the past few weeks. Pt reports seeing her doctor on Monday for left shoulder discomfort as well. Pt reports this past Sunday she lifted something heavy and nw endorses chest discomfort.

## 2023-11-09 NOTE — Discharge Instructions (Signed)
 Follow-up with your cardiologist as planned.  Return sooner if problems

## 2023-11-15 ENCOUNTER — Encounter

## 2023-11-15 ENCOUNTER — Telehealth: Payer: Self-pay | Admitting: Orthopedic Surgery

## 2023-11-15 NOTE — Telephone Encounter (Signed)
 Tried calling patient. No answer, no VM to LM. Will send mychart message as well.

## 2023-11-15 NOTE — Telephone Encounter (Signed)
 Patient called. She would like gel injections in her shoulder. Would like a call as well. Her cb# [phone]

## 2023-11-15 NOTE — Telephone Encounter (Signed)
 Patient returning a call to Lauren.

## 2023-11-17 ENCOUNTER — Inpatient Hospital Stay: Attending: Physician Assistant

## 2023-11-17 DIAGNOSIS — C3412 Malignant neoplasm of upper lobe, left bronchus or lung: Secondary | ICD-10-CM | POA: Insufficient documentation

## 2023-11-17 DIAGNOSIS — C7931 Secondary malignant neoplasm of brain: Secondary | ICD-10-CM | POA: Insufficient documentation

## 2023-11-17 DIAGNOSIS — Z452 Encounter for adjustment and management of vascular access device: Secondary | ICD-10-CM | POA: Diagnosis not present

## 2023-11-21 ENCOUNTER — Encounter: Payer: Self-pay | Admitting: Emergency Medicine

## 2023-11-21 ENCOUNTER — Ambulatory Visit: Attending: Emergency Medicine | Admitting: Emergency Medicine

## 2023-11-21 VITALS — BP 118/62 | HR 73 | Ht 61.0 in | Wt 166.2 lb

## 2023-11-21 DIAGNOSIS — I10 Essential (primary) hypertension: Secondary | ICD-10-CM | POA: Insufficient documentation

## 2023-11-21 DIAGNOSIS — I2510 Atherosclerotic heart disease of native coronary artery without angina pectoris: Secondary | ICD-10-CM | POA: Insufficient documentation

## 2023-11-21 DIAGNOSIS — E785 Hyperlipidemia, unspecified: Secondary | ICD-10-CM | POA: Insufficient documentation

## 2023-11-21 DIAGNOSIS — E1122 Type 2 diabetes mellitus with diabetic chronic kidney disease: Secondary | ICD-10-CM | POA: Diagnosis not present

## 2023-11-21 DIAGNOSIS — N1832 Chronic kidney disease, stage 3b: Secondary | ICD-10-CM | POA: Insufficient documentation

## 2023-11-21 MED ORDER — NITROGLYCERIN 0.4 MG SL SUBL: 0.4000 mg | SUBLINGUAL_TABLET | SUBLINGUAL | 2 refills | Status: AC | PRN

## 2023-11-21 NOTE — Patient Instructions (Addendum)
 Medication Instructions:  NO CHANGES   Lab Work: NONE TO BE DONE TODAY.  Testing/Procedures: NONE  Follow-Up: At [facility], you and your health needs are our priority.  As part of our continuing mission to provide you with exceptional heart care, our providers are all part of one team.  This team includes your primary Cardiologist (physician) and Advanced Practice Providers or APPs (Physician Assistants and Nurse Practitioners) who all work together to provide you with the care you need, when you need it.  Your next appointment:   6 MONTHS  Provider:   Gordy Bergamo, MD

## 2023-11-21 NOTE — Progress Notes (Signed)
 Cardiology Office Note:    Date:  11/21/2023  ID:  Carolyn [name] Minerva, DOB [birth year], MRN [id number] PCP: Royden Ronal Czar, FNP  [facility] Providers Cardiologist:  Gordy Bergamo, MD       Patient Profile:       Chief Complaint: 1 year follow-up History of Present Illness:  Carolyn [name] is a 68 y.o. 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  Echocardiogram 04/09/2020 showed LVEF 61%, moderate LVH, grade 1 DD, mild MR, mild TR.  Patient was last seen in the office on 11/23/2022 by Dr. Bergamo.  She was with no reoccurrence since chest pains.  Her blood pressure is well-controlled.  No changes were made to her medication regimen and she was to follow-up in 1 year.  She was recently seen in the ED on  11/09/2023 with complaints of arm pain and atypical chest pain.  She reported bilateral shoulder pains and 1 episode of atypical chest pain.  Her EKG was without acute ischemic changes.  Her troponins were negative.  She was discharged home in stable condition.   Discussed the use of AI scribe software for clinical note transcription with the patient, who gave verbal consent to proceed.  History of Present Illness Carolyn [name] is a 68 year old female with coronary artery disease who presents with bilateral shoulder pain and 1 year follow-up.  She experiences bilateral shoulder pain for about a month.  She denies any chest pains or dyspnea.  She denies her prior anginal equivalent which was shortness of breath and chest pressure.  She maintains regular physical activity without any exertional symptoms.  She reports to experience shoulder pains that is persistent and not intermittent.  Her shoulder pain is described  as an ache and exacerbated when she reaches for objects or picks up heavy items.  Her recent LDL cholesterol is 57, and A1c is 5.9. She is on medication for these conditions.  She denies any palpitations, orthopnea, PND, syncope, presyncope, lightheadedness, dizziness.   Review of systems:  Please see the history of present illness. All other systems are reviewed and otherwise negative.      Studies Reviewed:    EKG Interpretation Date/Time:  Monday November 21 2023 08:41:54 EDT Ventricular Rate:  65 PR Interval:  162 QRS Duration:  78 QT Interval:  372 QTC Calculation: 386 R Axis:   25  Text Interpretation: Normal sinus rhythm Nonspecific T wave abnormality When compared with ECG of 09-Nov-2023 09:56, No significant change was found Confirmed by Rana Dixon [phone]) on 11/21/2023 10:20:05 AM   Echocardiogram 04/09/2020 Left ventricle cavity is normal in size. Moderate concentric hypertrophy  of the left ventricle. Normal global wall motion. Normal LV systolic  function with EF 61%. Doppler evidence of grade I (impaired) diastolic  dysfunction, normal LAP.  Left atrial cavity is mildly dilated.  Mild (Grade I) mitral regurgitation.  Mild tricuspid regurgitation.  No evidence of pulmonary hypertension.  Study in 2014 showed mild LVH, trace MR and TR>   Lexiscan  Myoview 04/07/2020 Normal ECG stress. The patient exercised for 7 minutes and 58 seconds of a Bruce protocol, achieving approximately 10.11 METs.  No chest pain. NOrmap BP response.  Myocardial perfusion is normal. Overall LV systolic function is normal without regional wall  motion abnormalities. Stress LV EF: 64%.  No previous exam available for comparison. Low risk . Risk Assessment/Calculations:              Physical Exam:   VS:  BP 118/62   Pulse 73   Ht 5' 1 (1.549 m)   Wt 166 lb 3.2 oz (75.4 kg)   SpO2 97%   BMI 31.40 kg/m    Wt Readings from Last 3 Encounters:  11/21/23 166 lb 3.2 oz (75.4 kg)   11/09/23 166 lb (75.3 kg)  09/06/23 164 lb (74.4 kg)    GEN: Well nourished, well developed in no acute distress NECK: No JVD; No carotid bruits CARDIAC: RRR, no murmurs, rubs, gallops RESPIRATORY:  Clear to auscultation without rales, wheezing or rhonchi  ABDOMEN: Soft, non-tender, non-distended EXTREMITIES:  No edema; No acute deformity      Assessment and Plan:  Coronary artery disease S/p DES to LAD in 2014 Lexiscan  Myoview 03/2020 was low risk - EKG today without acute ischemic changes - Today she is stable without chest pains or dyspnea.  Denies prior anginal equivalent.  She does report constant bilateral shoulder pain x 1 month described as an ache that is exacerbated by movement (reaching).  Does have history of bilateral shoulder surgery and following up with orthopedics.  She stays very active without any exertional chest pains or dyspnea.  Symptoms do not suggest active angina.  No indication for further ischemic evaluation - Continue aspirin  81 mg daily, amlodipine  10 mg daily, ezetimibe  10 mg daily, Livalo  4 mg daily, telmisartan 40 mg daily, carvedilol 12.5 mg twice daily, and nitro as needed  Hyperlipidemia, LDL goal <70 LDL 57 on 05/2023 and controlled - Continue Livalo  4 mg daily and ezetimibe  10 mg  Hypertension Blood pressure today is 118/62 and well-controlled - Continue amlodipine  10 mg daily, carvedilol 12.5 mg twice daily, and telmisartan 40 mg daily  T2DM A1c 5.9% in 02/2023 and well-controlled - She is currently managed on Jardiance  25 mg daily, metformin 500 mg daily, and Ozempic      Dispo:  Return in about 6 months (around 05/21/2024).  Signed, Lum [name] Louis, NP

## 2023-11-24 DIAGNOSIS — Z1231 Encounter for screening mammogram for malignant neoplasm of breast: Secondary | ICD-10-CM | POA: Diagnosis not present

## 2023-12-07 ENCOUNTER — Telehealth: Payer: Self-pay | Admitting: Cardiology

## 2023-12-07 DIAGNOSIS — I25118 Atherosclerotic heart disease of native coronary artery with other forms of angina pectoris: Secondary | ICD-10-CM

## 2023-12-07 MED ORDER — PITAVASTATIN CALCIUM 4 MG PO TABS: 1.0000 | ORAL_TABLET | Freq: Every day | ORAL | 3 refills | Status: AC

## 2023-12-07 NOTE — Telephone Encounter (Signed)
 Refill for Pitavastatin  has been sent to [facility].

## 2023-12-07 NOTE — Telephone Encounter (Signed)
*  STAT* If patient is at the pharmacy, call can be transferred to refill team.   1. Which medications need to be refilled? (please list name of each medication and dose if known)   Pitavastatin  Calcium  (LIVALO ) 4 MG TABS     2. Would you like to learn more about the convenience, safety, & potential cost savings by using the [facility]? No   3. Are you open to using the [facility] (Type [facility] No   4. Which pharmacy/location (including street and city if local pharmacy) is medication to be sent to?  [facility] - [place], [place] - [address]   5. Do they need a 30 day or 90 day supply? 90 day

## 2023-12-08 ENCOUNTER — Telehealth: Payer: Self-pay | Admitting: Cardiology

## 2023-12-08 DIAGNOSIS — N1832 Chronic kidney disease, stage 3b: Secondary | ICD-10-CM

## 2023-12-08 MED ORDER — EMPAGLIFLOZIN 25 MG PO TABS: 25.0000 mg | ORAL_TABLET | Freq: Every day | ORAL | 3 refills | Status: AC

## 2023-12-08 NOTE — Telephone Encounter (Signed)
 Pt saw Dr. Ladona 11/2023, refill Jardiance  has been [facility] to [facility]

## 2023-12-08 NOTE — Telephone Encounter (Signed)
*  STAT* If patient is at the pharmacy, call can be transferred to refill team.   1. Which medications need to be refilled? (please list name of each medication and dose if known)   empagliflozin  (JARDIANCE ) 25 MG TABS tablet    2. Which pharmacy/location (including street and city if local pharmacy) is medication to be sent to?  [facility] - [place], [place] - [address]      3. Do they need a 30 day or 90 day supply? 90 day

## 2023-12-09 ENCOUNTER — Telehealth: Payer: Self-pay | Admitting: Cardiology

## 2023-12-09 DIAGNOSIS — E7800 Pure hypercholesterolemia, unspecified: Secondary | ICD-10-CM

## 2023-12-09 MED ORDER — EZETIMIBE 10 MG PO TABS: 10.0000 mg | ORAL_TABLET | Freq: Every day | ORAL | 3 refills | Status: AC

## 2023-12-09 NOTE — Telephone Encounter (Signed)
*  STAT* If patient is at the pharmacy, call can be transferred to refill team.   1. Which medications need to be refilled? (please list name of each medication and dose if known) ezetimibe  (ZETIA ) 10 MG tablet   2. Which pharmacy/location (including street and city if local pharmacy) is medication to be sent to?  [facility] - [place], [place] - [address]      3. Do they need a 30 day or 90 day supply? 90 day

## 2023-12-09 NOTE — Telephone Encounter (Signed)
 Refill for Zetia  sent to  [facility]

## 2023-12-12 ENCOUNTER — Encounter: Payer: Self-pay | Admitting: Radiology

## 2023-12-12 ENCOUNTER — Ambulatory Visit (INDEPENDENT_AMBULATORY_CARE_PROVIDER_SITE_OTHER): Admitting: Orthopedic Surgery

## 2023-12-12 ENCOUNTER — Other Ambulatory Visit: Payer: Self-pay

## 2023-12-12 DIAGNOSIS — E1122 Type 2 diabetes mellitus with diabetic chronic kidney disease: Secondary | ICD-10-CM | POA: Diagnosis not present

## 2023-12-12 DIAGNOSIS — M25511 Pain in right shoulder: Secondary | ICD-10-CM | POA: Diagnosis not present

## 2023-12-12 DIAGNOSIS — M7551 Bursitis of right shoulder: Secondary | ICD-10-CM

## 2023-12-12 DIAGNOSIS — E611 Iron deficiency: Secondary | ICD-10-CM | POA: Diagnosis not present

## 2023-12-12 LAB — LAB REPORT - SCANNED
A1c: 6
EGFR: 44

## 2023-12-13 ENCOUNTER — Encounter: Payer: Self-pay | Admitting: Orthopedic Surgery

## 2023-12-13 MED ORDER — BUPIVACAINE HCL 0.5 % IJ SOLN
9.0000 mL | INTRAMUSCULAR | Status: AC | PRN
  Administered 2023-12-12: 9 mL via INTRA_ARTICULAR

## 2023-12-13 MED ORDER — LIDOCAINE HCL 1 % IJ SOLN
5.0000 mL | INTRAMUSCULAR | Status: AC | PRN
  Administered 2023-12-12: 5 mL

## 2023-12-13 MED ORDER — TRIAMCINOLONE ACETONIDE 40 MG/ML IJ SUSP
40.0000 mg | INTRAMUSCULAR | Status: AC | PRN
  Administered 2023-12-12: 40 mg via INTRA_ARTICULAR

## 2023-12-13 NOTE — Progress Notes (Signed)
 Office Visit Note   Patient: Carolyn [name]           Date of Birth: 1955/05/28           MRN: [id number] Visit Date: 12/12/2023 Requested by: Royden Ronal Czar, FNP [address] [place],  [place] [address] PCP: Royden Ronal Czar, FNP  Subjective: Chief Complaint  Patient presents with   Right Shoulder - Pain    Planned injection today    HPI: Carolyn [name] is a 68 y.o. female who presents to the office reporting right shoulder pain.  Patient has a history of right shoulder rotator cuff surgery 2016.  Also has a history of left shoulder injection which helped her up until about a month ago.  She has a known partial-thickness rotator cuff tear on that left-hand side.  Denies any history of acute injury to the right-hand side..                ROS: All systems reviewed are negative as they relate to the chief complaint within the history of present illness.  Patient denies fevers or chills.  Assessment & Plan: Visit Diagnoses:  1. Right shoulder pain, unspecified chronicity     Plan: Impression is right shoulder bursal type pain.  Cuff strength is good and radiographs also look good.  Plan is subacromial injection to see if that can help.  She wants to avoid any type of surgical intervention at all cost.  She will follow-up with us  as needed.  Follow-Up Instructions: No follow-ups on file.   Orders:  Orders Placed This Encounter  Procedures   XR Shoulder Right   No orders of the defined types were placed in this encounter.     Procedures: Large Joint Inj: R subacromial bursa on 12/12/2023 2:06 PM Indications: diagnostic evaluation and pain Details: 18 G 1.5 in needle, posterior approach  Arthrogram: No  Medications: 9 mL bupivacaine  0.5 %; 5 mL lidocaine  1 %; 40 mg triamcinolone  acetonide 40 MG/ML Outcome: tolerated well, no immediate complications Procedure, treatment alternatives, risks and benefits explained, specific risks discussed. Consent  was given by the patient. Immediately prior to procedure a time out was called to verify the correct patient, procedure, equipment, support staff and site/side marked as required. Patient was prepped and draped in the usual sterile fashion.       Clinical Data: No additional findings.  Objective: Vital Signs: There were no vitals taken for this visit.  Physical Exam:  Constitutional: Patient appears well-developed HEENT:  Head: Normocephalic Eyes:EOM are normal Neck: Normal range of motion Cardiovascular: Normal rate Pulmonary/chest: Effort normal Neurologic: Patient is alert Skin: Skin is warm Psychiatric: Patient has normal mood and affect  Ortho Exam: Ortho exam demonstrates good cervical spine range of motion.  Patient has a little bit weaker external rotation of the left compared to the right.  Passive range of motion is approximately 60/105/160.  Subscap strength 5+ out of 5 bilaterally.  Equivocal O'Brien's testing on the right negative on the left.  No discrete AC joint tenderness on the right or left-hand side.  Specialty Comments:  No specialty comments available.  Imaging: XR Shoulder Right Result Date: 12/13/2023 AP axillary outlet radiographs right shoulder reviewed.  Acromiohumeral distance maintained.  No superior migration of the humeral head.  No glenohumeral joint arthritis.  Evidence of prior rotator cuff surgery noted with enthesopathic changes around the greater tuberosity.  No acute fracture.  Shoulder is located.    PMFS History:  Patient Active Problem List   Diagnosis Date Noted   Sinusitis 03/16/2022   Complete tear of left rotator cuff    Biceps tendonitis, left    Degenerative superior labral anterior-to-posterior (SLAP) tear of left shoulder    Elevated serum creatinine 04/12/2019   Pyelonephritis 02/08/2019   Port-A-Cath in place 10/19/2018   Encounter for antineoplastic chemotherapy 09/28/2018   Encounter for antineoplastic immunotherapy  09/28/2018   Goals of care, counseling/discussion 09/28/2018   Adenocarcinoma, lung (HCC) 09/12/2018   Malignant neoplasm metastatic to brain (HCC) 08/30/2018   Mass of upper lobe of left lung 08/17/2018   Angina pectoris 06/14/2012   CAD (coronary artery disease), native coronary artery 06/14/2012   S/P PTCA (percutaneous transluminal coronary angioplasty) 06/14/2012   Hyperlipidemia 06/14/2012   Essential hypertension 06/14/2012   Pre-diabetes 06/14/2012   Past Medical History:  Diagnosis Date   Anginal pain     with exertion    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    Asthma    h/o   Brain metastases 08/2018   Chronic kidney disease    Coronary artery disease    Diabetes mellitus without complication (HCC)    Type II   GERD (gastroesophageal reflux disease)    H/O hiatal hernia    Heart murmur    History of radiation therapy 09/22/2018   stereotactic radiosurgery    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   Hypertension    nscl ca dx'd 08/07/18   Persistent headaches    h/o   Pneumonia    hx of PNA   Shortness of breath    Vaginal dryness    h/o    Family History  Problem Relation Age of Onset   Breast cancer Other    Diabetes Mother    Glaucoma Mother    Hypertension Mother    Hypertension Father    Asthma Father    Diabetes Sister    Diabetes Brother    Hypertension Sister     Past Surgical History:  Procedure Laterality Date   ABDOMINAL HYSTERECTOMY     bone spur     CARDIAC CATHETERIZATION  06/13/2012   carpel tunnel surgery Left    COLONOSCOPY     9 years ago   CORONARY ANGIOPLASTY  06/13/2012   EYE SURGERY     cyst left eye    IR IMAGING GUIDED PORT INSERTION  10/10/2018   LEFT HEART CATHETERIZATION WITH CORONARY ANGIOGRAM N/A 06/13/2012   Procedure: LEFT HEART CATHETERIZATION WITH CORONARY ANGIOGRAM;  Surgeon: Erick JONELLE Bergamo, MD;  Location: [facility] CATH LAB;  Service: Cardiovascular;  Laterality: N/A;   NECK SURGERY     rotator cuff surgery Right 2015   SHOULDER  ARTHROSCOPY WITH ROTATOR CUFF REPAIR AND SUBACROMIAL DECOMPRESSION Left 05/14/2021   Procedure: LEFT SHOULDER ARTHROSCOPY, SUBACROMIAL DECOMPRESSION, BICEPS TENODESIS, MINI OPEN ROTATOR CUFF REPAIR;  Surgeon: Addie Cordella Hamilton, MD;  Location: [facility] OR;  Service: Orthopedics;  Laterality: Left;   VIDEO BRONCHOSCOPY WITH ENDOBRONCHIAL NAVIGATION N/A 09/06/2018   Procedure: VIDEO BRONCHOSCOPY WITH ENDOBRONCHIAL NAVIGATION, left upper lung;  Surgeon: Shyrl Linnie KIDD, MD;  Location: [facility] OR;  Service: Thoracic;  Laterality: N/A;   VIDEO BRONCHOSCOPY WITH ENDOBRONCHIAL ULTRASOUND Left 09/06/2018   Procedure: VIDEO BRONCHOSCOPY WITH ENDOBRONCHIAL ULTRASOUND, left lung;  Surgeon: Shyrl Linnie KIDD, MD;  Location: [facility] OR;  Service: Thoracic;  Laterality: Left;   WISDOM TOOTH EXTRACTION     Social History   Occupational History   Not on file  Tobacco Use   Smoking status: Former    Current packs/day: 0.00    Average packs/day: 0.5 packs/day for 30.0 years (15.0 ttl pk-yrs)    Types: Cigarettes    Start date: 02/08/1969    Quit date: 02/09/1999    Years since quitting: 24.8    Passive exposure: Past   Smokeless tobacco: Never  Vaping Use   Vaping status: Never Used  Substance and Sexual Activity   Alcohol use: Not Currently   Drug use: Not Currently   Sexual activity: Yes    Birth control/protection: Post-menopausal

## 2023-12-15 ENCOUNTER — Ambulatory Visit
Admission: RE | Admit: 2023-12-15 | Discharge: 2023-12-15 | Disposition: A | Source: Ambulatory Visit | Attending: Internal Medicine

## 2023-12-15 DIAGNOSIS — C7931 Secondary malignant neoplasm of brain: Secondary | ICD-10-CM

## 2023-12-15 DIAGNOSIS — C7800 Secondary malignant neoplasm of unspecified lung: Secondary | ICD-10-CM | POA: Diagnosis not present

## 2023-12-15 MED ORDER — GADOPICLENOL 0.5 MMOL/ML IV SOLN
7.5000 mL | Freq: Once | INTRAVENOUS | Status: AC | PRN
  Administered 2023-12-15: 7.5 mL via INTRAVENOUS

## 2023-12-19 ENCOUNTER — Inpatient Hospital Stay: Attending: Physician Assistant | Admitting: Internal Medicine

## 2023-12-19 ENCOUNTER — Encounter

## 2023-12-19 VITALS — BP 123/59 | HR 73 | Temp 97.7°F | Resp 17 | Ht 61.0 in | Wt 166.0 lb

## 2023-12-19 DIAGNOSIS — C7931 Secondary malignant neoplasm of brain: Secondary | ICD-10-CM | POA: Diagnosis not present

## 2023-12-19 DIAGNOSIS — F5101 Primary insomnia: Secondary | ICD-10-CM | POA: Diagnosis not present

## 2023-12-19 DIAGNOSIS — Z923 Personal history of irradiation: Secondary | ICD-10-CM | POA: Diagnosis not present

## 2023-12-19 DIAGNOSIS — C3412 Malignant neoplasm of upper lobe, left bronchus or lung: Secondary | ICD-10-CM | POA: Insufficient documentation

## 2023-12-19 DIAGNOSIS — I2510 Atherosclerotic heart disease of native coronary artery without angina pectoris: Secondary | ICD-10-CM | POA: Diagnosis not present

## 2023-12-19 DIAGNOSIS — E119 Type 2 diabetes mellitus without complications: Secondary | ICD-10-CM | POA: Diagnosis not present

## 2023-12-19 DIAGNOSIS — I10 Essential (primary) hypertension: Secondary | ICD-10-CM | POA: Diagnosis not present

## 2023-12-19 DIAGNOSIS — Z87891 Personal history of nicotine dependence: Secondary | ICD-10-CM | POA: Diagnosis not present

## 2023-12-19 DIAGNOSIS — Z803 Family history of malignant neoplasm of breast: Secondary | ICD-10-CM | POA: Insufficient documentation

## 2023-12-19 DIAGNOSIS — E559 Vitamin D deficiency, unspecified: Secondary | ICD-10-CM | POA: Diagnosis not present

## 2023-12-19 DIAGNOSIS — E611 Iron deficiency: Secondary | ICD-10-CM | POA: Diagnosis not present

## 2023-12-19 DIAGNOSIS — N1831 Chronic kidney disease, stage 3a: Secondary | ICD-10-CM | POA: Diagnosis not present

## 2023-12-19 DIAGNOSIS — E785 Hyperlipidemia, unspecified: Secondary | ICD-10-CM | POA: Diagnosis not present

## 2023-12-19 DIAGNOSIS — R748 Abnormal levels of other serum enzymes: Secondary | ICD-10-CM | POA: Diagnosis not present

## 2023-12-19 NOTE — Progress Notes (Signed)
 [facility] [address]  [place], [place] [address] [phone]   Interval Evaluation  Date of Service: 12/19/23 Patient Name: Carolyn [name] Patient MRN: [id number] Patient DOB: Jun 19, 1955 Provider: Arthea MARLA Manns, MD  Identifying Statement:  Carolyn [name] is a 68 y.o. female with Malignant neoplasm metastatic to brain [facility]) [C79.31]   Primary Cancer:  Oncologic History: Oncology History  Adenocarcinoma, lung (HCC)  09/12/2018 Initial Diagnosis   Adenocarcinoma, lung (HCC)   10/05/2018 - 06/08/2022 Chemotherapy   Patient is on Treatment Plan : LUNG Carboplatin  (5) + Pemetrexed  (500) + Pembrolizumab  (200) D1 q21d Induction x 4 cycles / Maintenance Pemetrexed  (500) + Pembrolizumab  (200) D1 q21d     05/14/2020 Cancer Staging   Staging form: Lung, AJCC 8th Edition - Clinical: Stage IVB (cT1c, cN1, cM1c) - Signed by Sherrod Sherrod, MD on 05/14/2020    CNS Oncologic History: 09/22/18: Completes SRS to 9 sub-cm metastases [facility]) 09/02/20: SRS x1 [facility]) 12/15/20: SRS R frontal (SS)  Interval History:  Carolyn [name] presents today for follow up after recent MRI brain.  She denies any new or progressive changes today.  No severe headaches reported. Remains phsyically and mentally active.  Continues on observation with Dr. Sherrod.  Medications: Current Outpatient Medications on File Prior to Visit  Medication Sig Dispense Refill   acetaminophen  (TYLENOL ) 500 MG tablet Take 1,000 mg by mouth every 8 (eight) hours as needed for mild pain, fever or headache.      amLODipine  (NORVASC ) 10 MG tablet Take 1 tablet by mouth once daily 90 tablet 2   aspirin  EC 81 MG tablet Take 81 mg by mouth daily.     carvedilol (COREG) 25 MG tablet Take 1/2 (one-half) tablet by mouth twice daily 90 tablet 2   Cholecalciferol  (VITAMIN D-3) 125 MCG (5000 UT) TABS Take 5,000 Units by mouth daily.     empagliflozin  (JARDIANCE ) 25 MG TABS tablet Take 1  tablet (25 mg total) by mouth daily before breakfast. 90 tablet 3   esomeprazole (NEXIUM) 20 MG capsule Take 20 mg by mouth daily with breakfast.     ezetimibe  (ZETIA ) 10 MG tablet Take 1 tablet (10 mg total) by mouth daily. 90 tablet 3   fexofenadine (ALLEGRA) 180 MG tablet Take 180 mg by mouth daily.     fluticasone (FLONASE) 50 MCG/ACT nasal spray Place 1 spray into both nostrils daily as needed for allergies or rhinitis.     Lancets (ONETOUCH DELICA PLUS LANCET33G) MISC SMARTSIG:1 Topical 3 Times Daily     levocetirizine (XYZAL) 5 MG tablet Take 5 mg by mouth every evening.     lidocaine -prilocaine  (EMLA ) cream Apply to the Port-A-Cath site 30 to 60 minutes before chemotherapy. 30 g 2   Melatonin 1 MG CAPS Take 2 mg by mouth at bedtime as needed (for insomnia).     metFORMIN (GLUCOPHAGE) 500 MG tablet Take 500 mg by mouth daily with supper.     methocarbamol  (ROBAXIN ) 500 MG tablet Take 1 tablet (500 mg total) by mouth every 8 (eight) hours as needed for muscle spasms. 30 tablet 0   montelukast (SINGULAIR) 10 MG tablet Take 10 mg by mouth daily as needed (allergies).     nitroGLYCERIN  (NITROSTAT ) 0.4 MG SL tablet Place 1 tablet (0.4 mg total) under the tongue every 5 (five) minutes as needed for chest pain. 25 tablet 2   ONETOUCH ULTRA test strip USE 1 STRIP TO CHECK GLUCOSE THREE TIMES DAILY  Pitavastatin  Calcium  (LIVALO ) 4 MG TABS Take 1 tablet (4 mg total) by mouth daily. 90 tablet 3   Polyethyl Glycol-Propyl Glycol (SYSTANE) 0.4-0.3 % GEL ophthalmic gel Place 1 application. into both eyes every 6 (six) hours as needed (dry eyes).     Semaglutide,0.25 or 0.5MG /DOS, (OZEMPIC, 0.25 OR 0.5 MG/DOSE,) 2 MG/1.5ML SOPN Inject 0.25 mg into the skin once a week.     telmisartan (MICARDIS) 40 MG tablet Take 40 mg by mouth daily.     No current facility-administered medications on file prior to visit.    Allergies:  Allergies  Allergen Reactions   Atorvastatin  Other (See Comments)    Severe  myalgia   Crestor [Rosuvastatin] Other (See Comments)    Severe myalgias   Lisinopril  Swelling    Lips and face swelled up.   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   Hypertension    nscl ca dx'd 08/07/18   Persistent headaches    h/o   Pneumonia    hx of PNA   Shortness of breath    Vaginal dryness    h/o   Past Surgical History:  Past Surgical History:  Procedure Laterality Date   ABDOMINAL HYSTERECTOMY     bone spur     CARDIAC CATHETERIZATION  06/13/2012   carpel tunnel surgery Left    COLONOSCOPY     9 years ago   CORONARY ANGIOPLASTY  06/13/2012   EYE SURGERY     cyst left eye    IR IMAGING GUIDED PORT INSERTION  10/10/2018   LEFT HEART CATHETERIZATION WITH CORONARY ANGIOGRAM N/A 06/13/2012   Procedure: LEFT HEART CATHETERIZATION WITH CORONARY ANGIOGRAM;  Surgeon: Erick JONELLE Bergamo, MD;  Location: [facility] CATH LAB;  Service: Cardiovascular;  Laterality: N/A;   NECK SURGERY     rotator cuff surgery Right 2015   SHOULDER ARTHROSCOPY WITH ROTATOR CUFF REPAIR AND SUBACROMIAL DECOMPRESSION Left 05/14/2021   Procedure: LEFT SHOULDER ARTHROSCOPY, SUBACROMIAL DECOMPRESSION, BICEPS TENODESIS, MINI OPEN ROTATOR CUFF REPAIR;  Surgeon: Addie Cordella Hamilton, MD;  Location: [facility] OR;  Service: Orthopedics;  Laterality: Left;   VIDEO BRONCHOSCOPY WITH ENDOBRONCHIAL NAVIGATION N/A 09/06/2018   Procedure: VIDEO BRONCHOSCOPY WITH ENDOBRONCHIAL NAVIGATION, left upper lung;   Surgeon: Shyrl Linnie KIDD, MD;  Location: [facility] OR;  Service: Thoracic;  Laterality: N/A;   VIDEO BRONCHOSCOPY WITH ENDOBRONCHIAL ULTRASOUND Left 09/06/2018   Procedure: VIDEO BRONCHOSCOPY WITH ENDOBRONCHIAL ULTRASOUND, left lung;  Surgeon: Shyrl Linnie KIDD, MD;  Location: [facility] OR;  Service: Thoracic;  Laterality: Left;   WISDOM TOOTH EXTRACTION     Social History:  Social History   Socioeconomic History   Marital status: Married    Spouse name: Not on file   Number of children: 3   Years of education: Not on file   Highest education level: Not on  file  Occupational History   Not on file  Tobacco Use   Smoking status: Former    Current packs/day: 0.00    Average packs/day: 0.5 packs/day for 30.0 years (15.0 ttl pk-yrs)    Types: Cigarettes    Start date: 02/08/1969    Quit date: 02/09/1999    Years since quitting: 24.8    Passive exposure: Past   Smokeless tobacco: Never  Vaping Use   Vaping status: Never Used  Substance and Sexual Activity   Alcohol use: Not Currently   Drug use: Not Currently   Sexual activity: Yes    Birth control/protection: Post-menopausal  Other Topics Concern   Not on file  Social History Narrative   Not on file   Social Drivers of Health   Financial Resource Strain: Not on file  Food Insecurity: Not on file  Transportation Needs: No Transportation Needs (08/29/2018)   PRAPARE - Administrator, Civil Service (Medical): No    Lack of Transportation (Non-Medical): No  Physical Activity: Not on file  Stress: Not on file  Social Connections: Not on file  Intimate Partner Violence: Not At Risk (08/29/2018)   Humiliation, Afraid, Rape, and Kick questionnaire    Fear of Current or Ex-Partner: No    Emotionally Abused: No    Physically Abused: No    Sexually Abused: No   Family History:  Family History  Problem Relation Age of Onset   Breast cancer Other    Diabetes Mother    Glaucoma Mother    Hypertension Mother    Hypertension  Father    Asthma Father    Diabetes Sister    Diabetes Brother    Hypertension Sister     Review of Systems: Constitutional: Doesn't report fevers, chills or abnormal weight loss Eyes: Doesn't report blurriness of vision Ears, nose, mouth, throat, and face: Doesn't report sore throat Respiratory: Doesn't report cough, dyspnea or wheezes Cardiovascular: Doesn't report palpitation, chest discomfort  Gastrointestinal:  Doesn't report nausea, constipation, diarrhea GU: Doesn't report incontinence Skin: Doesn't report skin rashes Neurological: Per HPI Musculoskeletal: Doesn't report joint pain Behavioral/Psych: Doesn't report anxiety  Physical Exam: Vitals:   12/19/23 0855 12/19/23 0856  BP: (!) 142/68 (!) 123/59  Pulse: 73   Resp: 17   Temp: 97.7 F (36.5 C)   SpO2: 100%      KPS: 90. General: Alert, cooperative, pleasant, in no acute distress Head: Normal EENT: No conjunctival injection or scleral icterus.  Lungs: Resp effort normal Cardiac: Regular rate Abdomen: Non-distended abdomen Skin: No rashes cyanosis or petechiae. Extremities: No clubbing or edema  Neurologic Exam: Mental Status: Awake, alert, attentive to examiner. Oriented to self and environment. Language is fluent with intact comprehension.  Cranial Nerves: Visual acuity is grossly normal. Visual fields are full. Extra-ocular movements intact. No ptosis. Face is symmetric Motor: Tone and bulk are normal. Power is full in both arms and legs. Reflexes are symmetric, no pathologic reflexes present.  Sensory: Intact to light touch Gait: Normal.   Labs: I have reviewed the data as listed    Component Value Date/Time   NA 141 11/09/2023 1027   NA 139 08/18/2020 0801   K 4.3 11/09/2023 1027   CL 104 11/09/2023 1027   CO2 24 11/09/2023 1027   GLUCOSE 101 (H) 11/09/2023 1027   BUN 19 11/09/2023 1027   BUN 16 08/18/2020 0801   CREATININE 1.55 (H) 11/09/2023 1027   CREATININE 1.60 (H) 08/24/2023 0748    CALCIUM   9.6 11/09/2023 1027   PROT 7.1 11/09/2023 1027   ALBUMIN 4.1 11/09/2023 1027   AST 25 11/09/2023 1027   AST 21 08/24/2023 0748   ALT 16 11/09/2023 1027   ALT 12 08/24/2023 0748   ALKPHOS 88 11/09/2023 1027   BILITOT 0.4 11/09/2023 1027   BILITOT 0.3 08/24/2023 0748   GFRNONAA 36 (L) 11/09/2023 1027   GFRNONAA 35 (L) 08/24/2023 0748   GFRAA 37 (L) 11/12/2019 0917   GFRAA 37 (L) 11/08/2019 0900   Lab Results  Component Value Date   WBC 6.4 11/09/2023   NEUTROABS 3.9 11/09/2023   HGB 13.3 11/09/2023   HCT 43.7 11/09/2023   MCV 81.7 11/09/2023   PLT 269 11/09/2023    Imaging:  MR BRAIN W WO CONTRAST Result Date: 12/15/2023 EXAM: MRI BRAIN WITH AND WITHOUT CONTRAST 12/15/2023 10:28:20 AM TECHNIQUE: Multiplanar multisequence MRI of the head/brain was performed with and without the administration of intravenous contrast. COMPARISON: Brain MRI 06/16/2023 and prior studies. CLINICAL HISTORY: 68 year old female with metastatic lung cancer, status post whole brain radiation and SRS. FINDINGS: BRAIN AND VENTRICLES: No acute infarct. No acute intracranial hemorrhage. No mass effect or midline shift. No hydrocephalus. The sella is unremarkable. Normal flow voids. No suspicious dural thickening. No new signal abnormality identified. Stable major vascular flow voids; Distal right vertebral artery appears to be dominant. Major dural sinuses are enhancing postcontrast and appear to be patent. Small and irregular focus of residual enhancement right superior frontal gyrus on series 13 images 138 through 144 remains stable to decreased. Stable hemosiderin. Mild regional T2 and FLAIR hyperintensity appears increased from last year but with no regional mass effect (series 10 image 49). Similar post treatment area anterior left temporal lobe series 13 image 74 remains stable. Stable hemosiderin. Additional faint enhancement in the left periatrial white matter and left occipital white matter both on  series 14 image 92 remain stable. Punctate enhancement left cerebellum on series 14 image 37 is stable. No progressed or suspicious intracranial hemorrhage now. ORBITS: No acute abnormality. SINUSES: No acute abnormality. BONES AND SOFT TISSUES: Normal bone marrow signal and enhancement. No acute soft tissue abnormality. Cervical ACDF redemonstrated. Bone marrow signal stable and within normal limits. Negative visible cervical spinal cord. IMPRESSION: 1. Stable post-treatment appearance of the brain. Several small residual enhancing lesions with no progressed or suspicious intracranial enhancement now. 2. No new intracranial abnormality. Electronically signed by: Helayne Hurst MD 12/15/2023 10:39 AM EST RP Workstation: [id number]   XR Shoulder Right Result Date: 12/13/2023 AP axillary outlet radiographs right shoulder reviewed.  Acromiohumeral distance maintained.  No superior migration of the humeral head.  No glenohumeral joint arthritis.  Evidence of prior rotator cuff surgery noted with enthesopathic changes around the greater tuberosity.  No acute fracture.  Shoulder is located.    [facility] Clinician Interpretation: I have personally reviewed the radiological images as listed.  My interpretation, in the context of the patient's clinical presentation, is stable disease    Assessment/Plan Malignant neoplasm metastatic to brain [facility]) [C79.31]  Carolyn [name] is clinically stable today, now 3 years s/p most recent SRS.  MRI brain demonstrates stable findings, no tumor recurrence.  We ask that Carolyn [name] return to clinic in 9 months following next brain MRI, or sooner as needed.  If next scan is stable we can move out to 9-12 months.  May treat headaches with PRN Tylenol  as prior.  We appreciate the opportunity to participate in the care of Carolyn [name].  All questions were answered. The patient knows to call the clinic with any problems, questions or concerns. No barriers to learning  were detected.  I have spent a total of 30 minutes of face-to-face and non-face-to-face time, excluding clinical staff time, preparing to see patient, ordering tests and/or medications, counseling the patient, and independently interpreting results and communicating results to the patient/family/caregiver    Brittay Mogle K Hildy Nicholl, MD Medical Director of Neuro-Oncology [facility] 12/19/23 9:06 AM

## 2023-12-20 ENCOUNTER — Other Ambulatory Visit: Payer: Self-pay | Admitting: Radiation Therapy

## 2023-12-22 ENCOUNTER — Telehealth: Payer: Self-pay | Admitting: Internal Medicine

## 2023-12-22 NOTE — Telephone Encounter (Signed)
 Scheduled patient for next appointment. Called and spoke with the patient, she is aware.

## 2023-12-28 DIAGNOSIS — E1122 Type 2 diabetes mellitus with diabetic chronic kidney disease: Secondary | ICD-10-CM | POA: Diagnosis not present

## 2023-12-28 DIAGNOSIS — C3492 Malignant neoplasm of unspecified part of left bronchus or lung: Secondary | ICD-10-CM | POA: Diagnosis not present

## 2023-12-28 DIAGNOSIS — N1832 Chronic kidney disease, stage 3b: Secondary | ICD-10-CM | POA: Diagnosis not present

## 2023-12-28 DIAGNOSIS — I129 Hypertensive chronic kidney disease with stage 1 through stage 4 chronic kidney disease, or unspecified chronic kidney disease: Secondary | ICD-10-CM | POA: Diagnosis not present

## 2023-12-29 ENCOUNTER — Encounter: Payer: Self-pay | Admitting: Registered Nurse

## 2023-12-29 ENCOUNTER — Other Ambulatory Visit: Payer: Self-pay | Admitting: Cardiology

## 2023-12-29 DIAGNOSIS — I10 Essential (primary) hypertension: Secondary | ICD-10-CM

## 2023-12-29 LAB — LAB REPORT - SCANNED: Creatinine, POC: 90.9 mg/dL

## 2024-01-09 ENCOUNTER — Inpatient Hospital Stay: Attending: Physician Assistant

## 2024-01-09 ENCOUNTER — Encounter (HOSPITAL_COMMUNITY): Payer: Self-pay

## 2024-01-09 ENCOUNTER — Ambulatory Visit (HOSPITAL_COMMUNITY)
Admission: RE | Admit: 2024-01-09 | Discharge: 2024-01-09 | Disposition: A | Source: Ambulatory Visit | Attending: Internal Medicine | Admitting: Internal Medicine

## 2024-01-09 DIAGNOSIS — C3490 Malignant neoplasm of unspecified part of unspecified bronchus or lung: Secondary | ICD-10-CM

## 2024-01-09 DIAGNOSIS — C7931 Secondary malignant neoplasm of brain: Secondary | ICD-10-CM | POA: Insufficient documentation

## 2024-01-09 DIAGNOSIS — K5710 Diverticulosis of small intestine without perforation or abscess without bleeding: Secondary | ICD-10-CM | POA: Diagnosis not present

## 2024-01-09 DIAGNOSIS — C3412 Malignant neoplasm of upper lobe, left bronchus or lung: Secondary | ICD-10-CM | POA: Insufficient documentation

## 2024-01-09 DIAGNOSIS — I3139 Other pericardial effusion (noninflammatory): Secondary | ICD-10-CM | POA: Diagnosis not present

## 2024-01-09 DIAGNOSIS — I700 Atherosclerosis of aorta: Secondary | ICD-10-CM | POA: Diagnosis not present

## 2024-01-09 LAB — CMP (CANCER CENTER ONLY)
ALT: 15 U/L (ref 0–44)
AST: 24 U/L (ref 15–41)
Albumin: 3.8 g/dL (ref 3.5–5.0)
Alkaline Phosphatase: 78 U/L (ref 38–126)
Anion gap: 9 (ref 5–15)
BUN: 15 mg/dL (ref 8–23)
CO2: 26 mmol/L (ref 22–32)
Calcium: 9.1 mg/dL (ref 8.9–10.3)
Chloride: 106 mmol/L (ref 98–111)
Creatinine: 1.46 mg/dL — ABNORMAL HIGH (ref 0.44–1.00)
GFR, Estimated: 38 mL/min — ABNORMAL LOW (ref >60)
Glucose, Bld: 103 mg/dL — ABNORMAL HIGH (ref 70–99)
Potassium: 4.1 mmol/L (ref 3.5–5.1)
Sodium: 141 mmol/L (ref 135–145)
Total Bilirubin: 0.3 mg/dL (ref 0.0–1.2)
Total Protein: 6.6 g/dL (ref 6.5–8.1)

## 2024-01-09 LAB — CBC WITH DIFFERENTIAL (CANCER CENTER ONLY)
Abs Immature Granulocytes: 0.01 K/uL (ref 0.00–0.07)
Basophils Absolute: 0 K/uL (ref 0.0–0.1)
Basophils Relative: 1 %
Eosinophils Absolute: 0.2 K/uL (ref 0.0–0.5)
Eosinophils Relative: 4 %
HCT: 41.6 % (ref 36.0–46.0)
Hemoglobin: 13.2 g/dL (ref 12.0–15.0)
Immature Granulocytes: 0 %
Lymphocytes Relative: 22 %
Lymphs Abs: 1.3 K/uL (ref 0.7–4.0)
MCH: 25.5 pg — ABNORMAL LOW (ref 26.0–34.0)
MCHC: 31.7 g/dL (ref 30.0–36.0)
MCV: 80.3 fL (ref 80.0–100.0)
Monocytes Absolute: 0.5 K/uL (ref 0.1–1.0)
Monocytes Relative: 8 %
Neutro Abs: 3.9 K/uL (ref 1.7–7.7)
Neutrophils Relative %: 65 %
Platelet Count: 205 K/uL (ref 150–400)
RBC: 5.18 MIL/uL — ABNORMAL HIGH (ref 3.87–5.11)
RDW: 16.7 % — ABNORMAL HIGH (ref 11.5–15.5)
WBC Count: 5.9 K/uL (ref 4.0–10.5)
nRBC: 0 % (ref 0.0–0.2)

## 2024-01-16 ENCOUNTER — Inpatient Hospital Stay: Admitting: Internal Medicine

## 2024-01-16 VITALS — BP 131/68 | HR 70 | Temp 96.7°F | Resp 17 | Ht 61.0 in | Wt 162.0 lb

## 2024-01-16 DIAGNOSIS — C3412 Malignant neoplasm of upper lobe, left bronchus or lung: Secondary | ICD-10-CM | POA: Diagnosis not present

## 2024-01-16 DIAGNOSIS — C3490 Malignant neoplasm of unspecified part of unspecified bronchus or lung: Secondary | ICD-10-CM

## 2024-01-16 NOTE — Progress Notes (Signed)
 [facility] [phone]   [phone]  OFFICE PROGRESS NOTE  Carolyn Ronal Czar, FNP [address] [place] [address]  DIAGNOSIS: Stage IV (T1c, N1, M1c ) non-small cell lung cancer, adenocarcinoma diagnosed in July 2020 and presented with left upper lobe lung nodule in addition to right upper lobe lung nodule with left hilar lymphadenopathy as well as multiple brain metastasis.  Biomarker Findings Microsatellite status - MS-Stable Tumor Mutational Burden - 4 Muts/Mb Genomic Findings For a complete list of the genes assayed, please refer to the Appendix. ATM G204* KRAS G12C PDGFRA P122T SMO R199W 7 Disease relevant genes with no reportable alterations: ALK, BRAF, EGFR, ERBB2, MET, RET, ROS1  PDL 1 expression was 1%  PRIOR THERAPY:  1) SRS to multiple brain metastasis under the care of Dr. Izell. 2) SRS to a 4 mm new brain metastasis under the care of Dr. Izell and Dr. Onetha on 09/02/2020. 3) Systemic chemotherapy with carboplatin  for AUC of 5, Alimta  500 mg/M2 and Keytruda  200 mg IV every 3 weeks.  First dose October 05, 2018.  Status post 65 cycles.  Starting from cycle #5 the patient will be on treatment with maintenance Alimta  500 mg/M2 and Keytruda  200 mg IV every 3 weeks.  She has been on treatment with single agent Keytruda  starting from cycle #9 secondary to renal insufficiency.  Last dose was given June 08, 2022 after the patient has been on treatment for more than 5 years with no evidence of progression.  CURRENT THERAPY: Observation.  INTERVAL HISTORY: Carolyn [name] 68 y.o. female returns to the clinic today for follow-up visit.Discussed the use of AI scribe software for clinical note transcription with the patient, who gave verbal consent to proceed.  History of Present Illness Carolyn [name] is a 68 year old female with stage 4 non-small cell lung cancer who presents for evaluation and repeat CT scan  for restaging of her disease.  She was diagnosed with stage 4 non-small cell lung cancer, adenocarcinoma, in July 2020, with a positive KRAS G12C mutation and PD-L1 expression of 1%. Her initial treatment included systemic chemotherapy with carboplatin  and pemetrexed  every three weeks for four cycles, followed by maintenance therapy with pemetrexed , and then pembrolizumab  alone.  She has no new complaints since her last visit in July. No chest pain or wheezing. She takes 'one day at a time.'  Recent blood work was performed, and she was concerned about the results. Two weeks prior, her blood work results were different.  She is currently undergoing routine imaging with a CT scan of the chest, abdomen, and pelvis to assess the status of her disease.     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  MEDICATIONS:  Current Outpatient Medications  Medication Sig Dispense Refill   acetaminophen  (TYLENOL ) 500 MG tablet Take 1,000 mg by mouth every 8 (eight) hours as needed for mild pain, fever or headache.      amLODipine  (NORVASC ) 10 MG tablet Take 1 tablet by mouth once daily 90 tablet 3   aspirin  EC 81 MG tablet Take 81 mg by mouth daily.     carvedilol (COREG) 25 MG tablet Take 0.5 tablets (12.5  mg total) by mouth 2 (two) times daily with a meal. 90 tablet 3   Cholecalciferol  (VITAMIN D-3) 125 MCG (5000 UT) TABS Take 5,000 Units by mouth daily.     empagliflozin  (JARDIANCE ) 25 MG TABS tablet Take 1 tablet (25 mg total) by mouth daily before breakfast. 90 tablet 3   esomeprazole (NEXIUM) 20 MG capsule Take 20 mg by mouth daily with breakfast.     ezetimibe  (ZETIA ) 10 MG tablet Take 1 tablet (10 mg total) by mouth daily. 90 tablet 3   fexofenadine (ALLEGRA) 180 MG tablet Take 180 mg by mouth daily.     fluticasone (FLONASE) 50 MCG/ACT nasal spray Place 1 spray into both nostrils daily as needed for allergies or rhinitis.     Lancets (ONETOUCH DELICA PLUS LANCET33G) MISC SMARTSIG:1 Topical 3 Times Daily     levocetirizine (XYZAL) 5 MG tablet Take 5 mg by mouth every evening.     lidocaine -prilocaine  (EMLA ) cream Apply to the Port-A-Cath site 30 to 60 minutes before chemotherapy. 30 g 2   Melatonin 1 MG CAPS Take 2 mg by mouth at bedtime as needed (for insomnia).     metFORMIN (GLUCOPHAGE) 500 MG tablet Take 500 mg by mouth daily with supper.     methocarbamol  (ROBAXIN ) 500 MG tablet Take 1 tablet (500 mg total) by mouth every 8 (eight) hours as needed for muscle spasms. 30 tablet 0   montelukast (SINGULAIR) 10 MG tablet Take 10 mg by mouth daily as needed (allergies).     nitroGLYCERIN  (NITROSTAT ) 0.4 MG SL tablet Place 1 tablet (0.4 mg total) under the tongue every 5 (five) minutes as needed for chest pain. 25 tablet 2   ONETOUCH ULTRA test strip USE 1 STRIP TO CHECK GLUCOSE THREE TIMES DAILY     Pitavastatin  Calcium  (LIVALO ) 4 MG TABS Take 1 tablet (4 mg total) by mouth daily. 90 tablet 3   Polyethyl Glycol-Propyl Glycol (SYSTANE) 0.4-0.3 % GEL ophthalmic gel Place 1 application. into both eyes every 6 (six) hours as needed (dry eyes).     Semaglutide,0.25 or 0.5MG /DOS, (OZEMPIC, 0.25 OR 0.5 MG/DOSE,) 2 MG/1.5ML SOPN Inject 0.25 mg into the skin once a week.     telmisartan (MICARDIS) 40  MG tablet Take 40 mg by mouth daily.     No current facility-administered medications for this visit.    SURGICAL HISTORY:  Past Surgical History:  Procedure Laterality Date   ABDOMINAL HYSTERECTOMY     bone spur     CARDIAC CATHETERIZATION  06/13/2012   carpel tunnel surgery Left    COLONOSCOPY     9 years ago   CORONARY ANGIOPLASTY  06/13/2012   EYE SURGERY     cyst left eye    IR IMAGING GUIDED PORT INSERTION  10/10/2018   LEFT HEART CATHETERIZATION WITH CORONARY ANGIOGRAM N/A 06/13/2012   Procedure:  LEFT HEART CATHETERIZATION WITH CORONARY ANGIOGRAM;  Surgeon: Erick JONELLE Bergamo, MD;  Location: [facility] CATH LAB;  Service: Cardiovascular;  Laterality: N/A;   NECK SURGERY     rotator cuff surgery Right 2015   SHOULDER ARTHROSCOPY WITH ROTATOR CUFF REPAIR AND SUBACROMIAL DECOMPRESSION Left 05/14/2021   Procedure: LEFT SHOULDER ARTHROSCOPY, SUBACROMIAL DECOMPRESSION, BICEPS TENODESIS, MINI OPEN ROTATOR CUFF REPAIR;  Surgeon: Addie Cordella Hamilton, MD;  Location: [facility] OR;  Service: Orthopedics;  Laterality: Left;   VIDEO BRONCHOSCOPY WITH ENDOBRONCHIAL NAVIGATION N/A 09/06/2018   Procedure: VIDEO BRONCHOSCOPY WITH ENDOBRONCHIAL NAVIGATION, left upper lung;  Surgeon: Shyrl Linnie KIDD, MD;  Location: [facility] OR;  Service: Thoracic;  Laterality: N/A;   VIDEO BRONCHOSCOPY WITH ENDOBRONCHIAL ULTRASOUND Left 09/06/2018   Procedure: VIDEO BRONCHOSCOPY WITH ENDOBRONCHIAL ULTRASOUND, left lung;  Surgeon: Shyrl Linnie KIDD, MD;  Location: [facility] OR;  Service: Thoracic;  Laterality: Left;   WISDOM TOOTH EXTRACTION      REVIEW OF SYSTEMS:  Constitutional: positive for fatigue Eyes: negative Ears, nose, mouth, throat, and face: negative Respiratory: negative Cardiovascular: negative Gastrointestinal: negative Genitourinary:negative Integument/breast: negative Hematologic/lymphatic: negative Musculoskeletal:negative Neurological: negative Behavioral/Psych: negative Endocrine: negative Allergic/Immunologic:  negative   PHYSICAL EXAMINATION: General appearance: alert, cooperative, and no distress Head: Normocephalic, without obvious abnormality, atraumatic Neck: no adenopathy, no JVD, supple, symmetrical, trachea midline, and thyroid  not enlarged, symmetric, no tenderness/mass/nodules Lymph nodes: Cervical, supraclavicular, and axillary nodes normal. Resp: clear to auscultation bilaterally Back: symmetric, no curvature. ROM normal. No CVA tenderness. Cardio: regular rate and rhythm, S1, S2 normal, no murmur, click, rub or gallop GI: soft, non-tender; bowel sounds normal; no masses,  no organomegaly Extremities: extremities normal, atraumatic, no cyanosis or edema Neurologic: Alert and oriented X 3, normal strength and tone. Normal symmetric reflexes. Normal coordination and gait  ECOG PERFORMANCE STATUS: 1 - Symptomatic but completely ambulatory  Blood pressure 131/68, pulse 70, temperature (!) 96.7 F (35.9 C), temperature source Temporal, resp. rate 17, height 5' 1 (1.549 m), weight 162 lb (73.5 kg), SpO2 99%.  LABORATORY DATA: Lab Results  Component Value Date   WBC 5.9 01/09/2024   HGB 13.2 01/09/2024   HCT 41.6 01/09/2024   MCV 80.3 01/09/2024   PLT 205 01/09/2024      Chemistry      Component Value Date/Time   NA 141 01/09/2024 0806   NA 139 08/18/2020 0801   K 4.1 01/09/2024 0806   CL 106 01/09/2024 0806   CO2 26 01/09/2024 0806   BUN 15 01/09/2024 0806   BUN 16 08/18/2020 0801   CREATININE 1.46 (H) 01/09/2024 0806      Component Value Date/Time   CALCIUM  9.1 01/09/2024 0806   ALKPHOS 78 01/09/2024 0806   AST 24 01/09/2024 0806   ALT 15 01/09/2024 0806   BILITOT 0.3 01/09/2024 0806       RADIOGRAPHIC STUDIES: CT CHEST ABDOMEN PELVIS WO CONTRAST Result Date: 01/13/2024 EXAM: CT CHEST, ABDOMEN AND PELVIS WITHOUT CONTRAST 01/09/2024 09:01:01 AM TECHNIQUE: CT of the chest, abdomen and pelvis was performed without the administration of intravenous contrast.  Multiplanar reformatted images are provided for review. Automated exposure control, iterative reconstruction, and/or weight based adjustment of the mA/kV was utilized to reduce the radiation dose to as low as reasonably achievable. COMPARISON: 08/17/2023 CLINICAL HISTORY: Non-small cell lung cancer (NSCLC), staging. * Tracking Code: BO * FINDINGS: CHEST: MEDIASTINUM AND LYMPH NODES: Port-a-cath tip: Lower superior vena cava (SVC). Heart and pericardium: Persistent anterior pericardial effusion, up to 1.4 cm in thickness but not surrounding  the pericardium circumferentially. Special calcifications along parts of the pericardium possibly reflecting prior pericarditis. Vessels: Thoracic aortic, coronary artery, and branch vessel atheromatous vascular calcifications. The central airways are clear. No mediastinal, hilar or axillary lymphadenopathy. Small type 1 hiatal hernia. LUNGS AND PLEURA: Apical pleural parenchymal scarring. Mild scarring peripherally in both upper lobes. Left upper lobe scarring extends to an irregular spiculated nodule with solid component measuring 0.6 x 0.5 cm on image 47 series 6 which is stable by my measurements. I don't discern a morphologic change. No focal consolidation or pulmonary edema. No pleural effusion or pneumothorax. ABDOMEN AND PELVIS: LIVER: The liver is unremarkable. GALLBLADDER AND BILE DUCTS: Gallbladder is unremarkable. No biliary ductal dilatation. SPLEEN: No acute abnormality. PANCREAS: No acute abnormality. ADRENAL GLANDS: No acute abnormality. KIDNEYS, URETERS AND BLADDER: Chronic lobulated appearance of the kidneys without a significant contour change compared back through 10/07/2020. No stones in the kidneys or ureters. No hydronephrosis. No perinephric or periureteral stranding. Urinary bladder is unremarkable. GI AND BOWEL: Stomach demonstrates no acute abnormality. Transverse duodenal diverticulum. Prominence of stool throughout the colon, favoring constipation.  There is no bowel obstruction. REPRODUCTIVE ORGANS: Uterus not well seen, presumed surgically absent. PERITONEUM AND RETROPERITONEUM: No ascites. No free air. VASCULATURE: Systemic atherosclerosis is present, including the aorta and iliac arteries. Aorta is normal in caliber. ABDOMINAL AND PELVIS LYMPH NODES: No lymphadenopathy. BONES AND SOFT TISSUES: Lower thoracic spondylosis. Transitional L5 vertebrae. 8 mm degenerative anterolisthesis of L4 on L5 with moderate right foraminal stenosis at this level due to disc uncovering and spurring. There is also central stenosis at the L4-5 level. Suspected left greater than right foraminal impingement at L3-4 due to disc bulge and facet arthropathy. Mild degenerative hip arthropathy bilaterally. No acute osseous abnormality. No focal soft tissue abnormality. IMPRESSION: 1. Stable irregular spiculated left upper lobe pulmonary nodule with a 0.6 x 0.5 cm solid component and adjacent scarring. 2. Persistent anterior pericardial effusion measuring up to 1.4 cm with pericardial calcifications suggestive of remote pericarditis. 3. Degenerative changes including lower thoracic spondylosis, transitional L5 vertebra, 8 mm degenerative anterolisthesis of L4 on L5 with moderate right foraminal stenosis and central stenosis at L4-5, suspected left greater than right foraminal impingement at L3-4 due to disc bulge and facet arthropathy, and mild bilateral hip arthropathy. 4. 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 Electronically signed by: Ryan Salvage MD 01/13/2024 04:58 PM EST RP Workstation: [id number]     ASSESSMENT AND PLAN: This is a very pleasant 68 years old African-American female with likely stage IV (T1c, N1, M1C) non-small cell lung cancer, adenocarcinoma with positive KRAS G12C mutation diagnosed in July 2020 and presented with left upper lobe lung nodule in addition to right upper lobe pulmonary nodule as well as left hilar adenopathy and metastatic lesion to the brain. Molecular studies showed no actionable mutation and PDL 1 expression was 1%. The patient underwent SBRT to the metastatic brain lesion under the care of Dr. Izell. The patient is currently undergoing systemic chemotherapy with carboplatin  for AUC of 5, Alimta  500 mg/M2 and Keytruda  200 mg IV every 3 weeks is status post 65 cycles.  Starting  from cycle #5 she is on maintenance treatment with Alimta  and Keytruda  every 3 weeks with only Keytruda  on the last few cycles because of the renal insufficiency. For the brain metastasis, she is followed by Dr. Buckley and she is supposed to have another MRI of the brain in July 2024. For the renal insufficiency, she is followed by nephrology. She will have Port-A-Cath flush every 6 weeks. She is currently on observation and she is feeling fine. She had repeat CT scan of the chest, abdomen and pelvis performed recently.  I personally and independently reviewed the scans and discussed the result with the patient today.  Her scan showed no concerning findings for disease progression. Assessment and Plan Assessment & Plan Stage 4 non-small cell lung cancer, adenocarcinoma with KRAS G12C mutation Stage 4 non-small cell lung cancer, adenocarcinoma with KRAS G12C mutation, diagnosed in July 2020. PD-L1 expression is 1%. Recent CT scan of the chest, abdomen, and pelvis shows stable disease with no progression or metastasis. She reports no new symptoms such as chest pain or  wheezing. - Scheduled follow-up appointment in six months for repeat imaging and evaluation.  Chemotherapy port management Chemotherapy port requires regular maintenance to ensure patency. - Flush chemotherapy port every two months to maintain patency. She was advised to call immediately if she has any concerning symptoms in the interval. The patient voices understanding of current disease status and treatment options and is in agreement with the current care plan. All questions were answered. The patient knows to call the clinic with any problems, questions or concerns. We can certainly see the patient much sooner if necessary. The total time spent in the appointment was 30 minutes.  Disclaimer: This note was dictated with voice recognition software. Similar sounding words can inadvertently be transcribed and may not be corrected upon review.

## 2024-03-07 ENCOUNTER — Ambulatory Visit: Admitting: Orthopedic Surgery

## 2024-03-07 ENCOUNTER — Encounter: Payer: Self-pay | Admitting: Orthopedic Surgery

## 2024-03-07 DIAGNOSIS — M7551 Bursitis of right shoulder: Secondary | ICD-10-CM | POA: Diagnosis not present

## 2024-03-07 DIAGNOSIS — M7552 Bursitis of left shoulder: Secondary | ICD-10-CM

## 2024-03-07 MED ORDER — BUPIVACAINE HCL 0.5 % IJ SOLN
9.0000 mL | INTRAMUSCULAR | Status: AC | PRN
  Administered 2024-03-07: 9 mL via INTRA_ARTICULAR

## 2024-03-07 MED ORDER — METHYLPREDNISOLONE ACETATE 40 MG/ML IJ SUSP
40.0000 mg | INTRAMUSCULAR | Status: AC | PRN
  Administered 2024-03-07: 40 mg via INTRA_ARTICULAR

## 2024-03-07 MED ORDER — LIDOCAINE HCL 1 % IJ SOLN
5.0000 mL | INTRAMUSCULAR | Status: AC | PRN
  Administered 2024-03-07: 5 mL

## 2024-03-07 NOTE — Progress Notes (Signed)
 "  Office Visit Note   Patient: Carolyn [name]           Date of Birth: 02-17-1955           MRN: [id number] Visit Date: 03/07/2024 Requested by: Royden Ronal Czar, FNP [address] [place],  [place] [address] PCP: Royden Ronal Czar, FNP  Subjective: No chief complaint on file.   HPI: Carolyn [name] is a 69 y.o. female who presents to the office reporting left shoulder pain.  Has a history of left shoulder rotator cuff tear repair biceps tenodesis done in 2023.  About 2 weeks ago during a cold step the patient developed some pain in the deltoid.  No discrete injury.  Radiated down the arm but not below the elbow.  Denies any neck pain.  Did well with right shoulder subacromial injection in November 2025..                ROS: All systems reviewed are negative as they relate to the chief complaint within the history of present illness.  Patient denies fevers or chills.  Assessment & Plan: Visit Diagnoses:  1. Bursitis of right shoulder     Plan: Impression is bursitis with some crepitus with passive range of motion localizing in that anterior lateral aspect of the shoulder.  5- out of 5 external rotation strength on the left but subscap strength is intact.  She does have good passive range of motion.  Plan at this time is subacromial injection.  Primarily for deltoid pain which is similar to the pain she had on the right-hand side which responded favorably to a subacromial injection.  If that does not help we could consider intra-articular ultrasound-guided glenohumeral joint injection in 6 weeks.  Follow-Up Instructions: No follow-ups on file.   Orders:  No orders of the defined types were placed in this encounter.  No orders of the defined types were placed in this encounter.     Procedures: Large Joint Inj: L subacromial bursa on 03/07/2024 8:19 AM Indications: diagnostic evaluation and pain Details: 18 G 1.5 in needle, posterior approach  Arthrogram:  No  Medications: 9 mL bupivacaine  0.5 %; 40 mg methylPREDNISolone  acetate 40 MG/ML; 5 mL lidocaine  1 % Outcome: tolerated well, no immediate complications Procedure, treatment alternatives, risks and benefits explained, specific risks discussed. Consent was given by the patient. Immediately prior to procedure a time out was called to verify the correct patient, procedure, equipment, support staff and site/side marked as required. Patient was prepped and draped in the usual sterile fashion.       Clinical Data: No additional findings.  Objective: Vital Signs: There were no vitals taken for this visit.  Physical Exam:  Constitutional: Patient appears well-developed HEENT:  Head: Normocephalic Eyes:EOM are normal Neck: Normal range of motion Cardiovascular: Normal rate Pulmonary/chest: Effort normal Neurologic: Patient is alert Skin: Skin is warm Psychiatric: Patient has normal mood and affect  Ortho Exam: Ortho exam demonstrates external rotation strength of 5- out of 5 on the left 5+ out of 5 on the right subscap strength is 5+ out of 5 bilaterally.  Passive range of motion is 65/90/160 on the left.  She does have a little bit of crepitus anterior laterally with internal and external rotation of that left arm at 90 degrees of abduction.  Positive impingement signs.  Specialty Comments:  No specialty comments available.  Imaging: No results found.   PMFS History: Patient Active Problem List   Diagnosis Date  Noted   Sinusitis 03/16/2022   Complete tear of left rotator cuff    Biceps tendonitis, left    Degenerative superior labral anterior-to-posterior (SLAP) tear of left shoulder    Elevated serum creatinine 04/12/2019   Pyelonephritis 02/08/2019   Port-A-Cath in place 10/19/2018   Encounter for antineoplastic chemotherapy 09/28/2018   Encounter for antineoplastic immunotherapy 09/28/2018   Goals of care, counseling/discussion 09/28/2018   Adenocarcinoma, lung (HCC)  09/12/2018   Malignant neoplasm metastatic to brain (HCC) 08/30/2018   Mass of upper lobe of left lung 08/17/2018   Angina pectoris 06/14/2012   CAD (coronary artery disease), native coronary artery 06/14/2012   S/P PTCA (percutaneous transluminal coronary angioplasty) 06/14/2012   Hyperlipidemia 06/14/2012   Essential hypertension 06/14/2012   Pre-diabetes 06/14/2012   Past Medical History:  Diagnosis Date   Anginal pain     with exertion    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    Asthma    h/o   Brain metastases 08/2018   Chronic kidney disease    Coronary artery disease    Diabetes mellitus without complication (HCC)    Type II   GERD (gastroesophageal reflux disease)    H/O hiatal hernia    Heart murmur    History of radiation therapy 09/22/2018   stereotactic radiosurgery    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   Hypertension    nscl ca dx'd 08/07/18   Persistent headaches    h/o   Pneumonia    hx of PNA   Shortness of breath    Vaginal dryness    h/o    Family History  Problem Relation Age of Onset   Breast cancer Other    Diabetes Mother    Glaucoma Mother    Hypertension Mother    Hypertension Father    Asthma Father    Diabetes Sister    Diabetes Brother    Hypertension Sister     Past Surgical History:  Procedure Laterality Date   ABDOMINAL HYSTERECTOMY     bone spur     CARDIAC CATHETERIZATION  06/13/2012   carpel tunnel surgery Left    COLONOSCOPY     9 years ago   CORONARY ANGIOPLASTY  06/13/2012   EYE SURGERY     cyst left eye    IR IMAGING GUIDED PORT INSERTION  10/10/2018   LEFT HEART CATHETERIZATION WITH CORONARY ANGIOGRAM N/A 06/13/2012   Procedure: LEFT HEART CATHETERIZATION WITH CORONARY ANGIOGRAM;  Surgeon: Erick JONELLE Bergamo, MD;  Location: [facility] CATH LAB;  Service: Cardiovascular;  Laterality: N/A;   NECK SURGERY     rotator cuff surgery Right 2015   SHOULDER ARTHROSCOPY WITH ROTATOR CUFF REPAIR AND SUBACROMIAL DECOMPRESSION Left 05/14/2021   Procedure: LEFT  SHOULDER ARTHROSCOPY, SUBACROMIAL DECOMPRESSION, BICEPS TENODESIS, MINI OPEN ROTATOR CUFF REPAIR;  Surgeon: Addie Cordella Hamilton, MD;  Location: [facility] OR;  Service: Orthopedics;  Laterality: Left;   VIDEO BRONCHOSCOPY WITH ENDOBRONCHIAL NAVIGATION N/A 09/06/2018   Procedure: VIDEO BRONCHOSCOPY WITH ENDOBRONCHIAL NAVIGATION, left upper lung;  Surgeon: Shyrl Linnie KIDD, MD;  Location: [facility] OR;  Service: Thoracic;  Laterality: N/A;   VIDEO BRONCHOSCOPY WITH ENDOBRONCHIAL ULTRASOUND Left 09/06/2018   Procedure: VIDEO BRONCHOSCOPY WITH ENDOBRONCHIAL ULTRASOUND, left lung;  Surgeon: Shyrl Linnie KIDD, MD;  Location: [facility] OR;  Service: Thoracic;  Laterality: Left;   WISDOM TOOTH EXTRACTION     Social History   Occupational History   Not on file  Tobacco Use   Smoking status: Former  Current packs/day: 0.00    Average packs/day: 0.5 packs/day for 30.0 years (15.0 ttl pk-yrs)    Types: Cigarettes    Start date: 02/08/1969    Quit date: 02/09/1999    Years since quitting: 25.0    Passive exposure: Past   Smokeless tobacco: Never  Vaping Use   Vaping status: Never Used  Substance and Sexual Activity   Alcohol use: Not Currently   Drug use: Not Currently   Sexual activity: Yes    Birth control/protection: Post-menopausal        "

## 2024-03-19 ENCOUNTER — Inpatient Hospital Stay: Attending: Physician Assistant

## 2024-05-17 ENCOUNTER — Inpatient Hospital Stay: Attending: Physician Assistant

## 2024-07-09 ENCOUNTER — Inpatient Hospital Stay: Attending: Physician Assistant

## 2024-07-16 ENCOUNTER — Inpatient Hospital Stay: Admitting: Internal Medicine

## 2024-09-17 ENCOUNTER — Inpatient Hospital Stay

## 2024-09-20 ENCOUNTER — Inpatient Hospital Stay: Admitting: Internal Medicine
